# Patient Record
Sex: Female | Born: 1945 | Race: White | Hispanic: No | Marital: Single | Smoking: Never smoker
Health system: Southern US, Community
[De-identification: ages and names within clinical notes are randomized; demographics above are authoritative.]

## PROBLEM LIST (undated history)

## (undated) DIAGNOSIS — E119 Type 2 diabetes mellitus without complications: Secondary | ICD-10-CM

## (undated) DIAGNOSIS — Z923 Personal history of irradiation: Secondary | ICD-10-CM

## (undated) DIAGNOSIS — R319 Hematuria, unspecified: Secondary | ICD-10-CM

## (undated) DIAGNOSIS — T7840XA Allergy, unspecified, initial encounter: Secondary | ICD-10-CM

## (undated) DIAGNOSIS — I5033 Acute on chronic diastolic (congestive) heart failure: Secondary | ICD-10-CM

## (undated) DIAGNOSIS — J40 Bronchitis, not specified as acute or chronic: Secondary | ICD-10-CM

## (undated) DIAGNOSIS — J302 Other seasonal allergic rhinitis: Secondary | ICD-10-CM

## (undated) DIAGNOSIS — K219 Gastro-esophageal reflux disease without esophagitis: Secondary | ICD-10-CM

## (undated) DIAGNOSIS — L589 Radiodermatitis, unspecified: Secondary | ICD-10-CM

## (undated) DIAGNOSIS — C50112 Malignant neoplasm of central portion of left female breast: Secondary | ICD-10-CM

## (undated) DIAGNOSIS — I2699 Other pulmonary embolism without acute cor pulmonale: Secondary | ICD-10-CM

## (undated) DIAGNOSIS — IMO0002 Reserved for concepts with insufficient information to code with codable children: Secondary | ICD-10-CM

## (undated) DIAGNOSIS — Z972 Presence of dental prosthetic device (complete) (partial): Secondary | ICD-10-CM

## (undated) DIAGNOSIS — I10 Essential (primary) hypertension: Secondary | ICD-10-CM

## (undated) DIAGNOSIS — Z5189 Encounter for other specified aftercare: Secondary | ICD-10-CM

## (undated) DIAGNOSIS — C50919 Malignant neoplasm of unspecified site of unspecified female breast: Secondary | ICD-10-CM

## (undated) DIAGNOSIS — C801 Malignant (primary) neoplasm, unspecified: Secondary | ICD-10-CM

## (undated) HISTORY — DX: Allergy, unspecified, initial encounter: T78.40XA

## (undated) HISTORY — DX: Reserved for concepts with insufficient information to code with codable children: IMO0002

## (undated) HISTORY — DX: Malignant neoplasm of unspecified site of unspecified female breast: C50.919

## (undated) HISTORY — DX: Personal history of irradiation: Z92.3

## (undated) HISTORY — PX: BREAST SURGERY: SHX581

## (undated) HISTORY — DX: Encounter for other specified aftercare: Z51.89

---

## 2011-07-16 ENCOUNTER — Other Ambulatory Visit: Payer: Self-pay | Admitting: Obstetrics and Gynecology

## 2011-07-16 DIAGNOSIS — N63 Unspecified lump in unspecified breast: Secondary | ICD-10-CM

## 2011-07-24 ENCOUNTER — Ambulatory Visit
Admission: RE | Admit: 2011-07-24 | Discharge: 2011-07-24 | Disposition: A | Discharge status: Home / Self Care | Payer: Medicare Other | Source: Ambulatory Visit | Attending: Obstetrics and Gynecology | Admitting: Obstetrics and Gynecology

## 2011-07-24 ENCOUNTER — Other Ambulatory Visit: Payer: Self-pay | Admitting: Obstetrics and Gynecology

## 2011-07-24 DIAGNOSIS — N63 Unspecified lump in unspecified breast: Secondary | ICD-10-CM

## 2011-07-24 DIAGNOSIS — N6310 Unspecified lump in the right breast, unspecified quadrant: Secondary | ICD-10-CM

## 2011-07-24 DIAGNOSIS — N6320 Unspecified lump in the left breast, unspecified quadrant: Secondary | ICD-10-CM

## 2011-07-30 ENCOUNTER — Ambulatory Visit
Admission: RE | Admit: 2011-07-30 | Discharge: 2011-07-30 | Disposition: A | Discharge status: Home / Self Care | Payer: Medicare Other | Source: Ambulatory Visit | Attending: Obstetrics and Gynecology | Admitting: Obstetrics and Gynecology

## 2011-07-30 ENCOUNTER — Other Ambulatory Visit: Payer: Self-pay | Admitting: Obstetrics and Gynecology

## 2011-07-30 DIAGNOSIS — N6310 Unspecified lump in the right breast, unspecified quadrant: Secondary | ICD-10-CM

## 2011-07-30 DIAGNOSIS — N6320 Unspecified lump in the left breast, unspecified quadrant: Secondary | ICD-10-CM

## 2011-07-31 ENCOUNTER — Ambulatory Visit
Admission: RE | Admit: 2011-07-31 | Discharge: 2011-07-31 | Disposition: A | Discharge status: Home / Self Care | Payer: Medicare Other | Source: Ambulatory Visit | Attending: Obstetrics and Gynecology | Admitting: Obstetrics and Gynecology

## 2011-07-31 ENCOUNTER — Other Ambulatory Visit: Payer: Self-pay | Admitting: Obstetrics and Gynecology

## 2011-07-31 DIAGNOSIS — N6320 Unspecified lump in the left breast, unspecified quadrant: Secondary | ICD-10-CM

## 2011-07-31 DIAGNOSIS — C50912 Malignant neoplasm of unspecified site of left female breast: Secondary | ICD-10-CM

## 2011-07-31 DIAGNOSIS — N6310 Unspecified lump in the right breast, unspecified quadrant: Secondary | ICD-10-CM

## 2011-08-01 ENCOUNTER — Telehealth: Payer: Self-pay | Admitting: *Deleted

## 2011-08-01 ENCOUNTER — Other Ambulatory Visit: Payer: Self-pay | Admitting: *Deleted

## 2011-08-01 DIAGNOSIS — C50119 Malignant neoplasm of central portion of unspecified female breast: Secondary | ICD-10-CM

## 2011-08-01 DIAGNOSIS — C50112 Malignant neoplasm of central portion of left female breast: Secondary | ICD-10-CM

## 2011-08-01 HISTORY — DX: Malignant neoplasm of central portion of left female breast: C50.112

## 2011-08-01 NOTE — Telephone Encounter (Signed)
Confirmed [facility] for 08/06/11 at 1200 .  Instructions and contact information given.  

## 2011-08-05 ENCOUNTER — Ambulatory Visit
Admission: RE | Admit: 2011-08-05 | Discharge: 2011-08-05 | Disposition: A | Discharge status: Home / Self Care | Payer: Medicare Other | Source: Ambulatory Visit | Attending: Obstetrics and Gynecology | Admitting: Obstetrics and Gynecology

## 2011-08-05 DIAGNOSIS — C50912 Malignant neoplasm of unspecified site of left female breast: Secondary | ICD-10-CM

## 2011-08-05 MED ORDER — GADOBENATE DIMEGLUMINE 529 MG/ML IV SOLN
20.0000 mL | Freq: Once | INTRAVENOUS | Status: AC | PRN
  Administered 2011-08-05: 20 mL via INTRAVENOUS

## 2011-08-06 ENCOUNTER — Ambulatory Visit: Payer: Medicare Other

## 2011-08-06 ENCOUNTER — Other Ambulatory Visit: Payer: Medicare Other

## 2011-08-06 ENCOUNTER — Ambulatory Visit (HOSPITAL_BASED_OUTPATIENT_CLINIC_OR_DEPARTMENT_OTHER): Payer: Medicare Other | Admitting: Oncology

## 2011-08-06 ENCOUNTER — Ambulatory Visit (HOSPITAL_BASED_OUTPATIENT_CLINIC_OR_DEPARTMENT_OTHER): Payer: Medicare Other | Admitting: Surgery

## 2011-08-06 ENCOUNTER — Encounter: Payer: Self-pay | Admitting: *Deleted

## 2011-08-06 ENCOUNTER — Ambulatory Visit
Admission: RE | Admit: 2011-08-06 | Discharge: 2011-08-06 | Disposition: A | Discharge status: Home / Self Care | Payer: Medicare Other | Source: Ambulatory Visit | Attending: Radiation Oncology | Admitting: Radiation Oncology

## 2011-08-06 ENCOUNTER — Encounter (INDEPENDENT_AMBULATORY_CARE_PROVIDER_SITE_OTHER): Payer: Self-pay | Admitting: Surgery

## 2011-08-06 ENCOUNTER — Ambulatory Visit: Payer: Medicare Other | Attending: Surgery | Admitting: Physical Therapy

## 2011-08-06 VITALS — BP 172/97 | HR 76 | Temp 98.2°F | Ht 68.0 in | Wt 278.8 lb

## 2011-08-06 DIAGNOSIS — M25619 Stiffness of unspecified shoulder, not elsewhere classified: Secondary | ICD-10-CM | POA: Insufficient documentation

## 2011-08-06 DIAGNOSIS — C50119 Malignant neoplasm of central portion of unspecified female breast: Secondary | ICD-10-CM

## 2011-08-06 DIAGNOSIS — IMO0001 Reserved for inherently not codable concepts without codable children: Secondary | ICD-10-CM | POA: Insufficient documentation

## 2011-08-06 DIAGNOSIS — R293 Abnormal posture: Secondary | ICD-10-CM | POA: Insufficient documentation

## 2011-08-06 DIAGNOSIS — Z170 Estrogen receptor positive status [ER+]: Secondary | ICD-10-CM

## 2011-08-06 DIAGNOSIS — C50919 Malignant neoplasm of unspecified site of unspecified female breast: Secondary | ICD-10-CM

## 2011-08-06 LAB — CBC WITH DIFFERENTIAL/PLATELET
BASO%: 0.3 % (ref 0.0–2.0)
EOS%: 1.4 % (ref 0.0–7.0)
LYMPH%: 31.5 % (ref 14.0–49.7)
MCHC: 34.2 g/dL (ref 31.5–36.0)
MCV: 92 fL (ref 79.5–101.0)
MONO%: 7.9 % (ref 0.0–14.0)
Platelets: 248 10*3/uL (ref 145–400)
RBC: 4.01 10*6/uL (ref 3.70–5.45)

## 2011-08-06 LAB — COMPREHENSIVE METABOLIC PANEL
ALT: 13 U/L (ref 0–35)
AST: 16 U/L (ref 0–37)
Alkaline Phosphatase: 90 U/L (ref 39–117)
Sodium: 139 mEq/L (ref 135–145)
Total Bilirubin: 0.3 mg/dL (ref 0.3–1.2)
Total Protein: 7.2 g/dL (ref 6.0–8.3)

## 2011-08-06 NOTE — Progress Notes (Signed)
Mailed after appt letter to pt. 

## 2011-08-06 NOTE — Progress Notes (Signed)
Dr.  Darnelle Catalan requested a consultation for genetic counseling and risk assessment for Linda [name], a 66 y.o. female, for discussion of her personal history of Bilateral breast cancer. She presents to clinic today to discuss the possibility of a genetic predisposition to cancer, and to further clarify her risks, as well as her family members' risks for cancer.   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    No past medical history on file.  No past surgical history on file.  History  Substance Use Topics  . Smoking status: Not on file  . Smokeless tobacco: Not on file  . Alcohol Use: Not on file    REPRODUCTIVE HISTORY AND PERSONAL RISK ASSESSMENT FACTORS: Menarche was at age 35-13.   Post-menopausal Uterus Intact: Yes Ovaries Intact: Yes [id number] , first live birth at age 82      FAMILY HISTORY:  We obtained a detailed, 4-generation family history.  Significant diagnoses are listed below:  Tazia's sister was dianosed with breast cancer at age 65 and her mother was diagnosed with breast cancer at age 74.  No other cancer history is know.  Patient's maternal ancestors are of German/American [place] and [place] descent, and paternal ancestors are of [place] descent. There is no reported Ashkenazi Jewish ancestry. There is no known consanguinity.  GENETIC COUNSELING RISK ASSESSMENT, DISCUSSION, AND SUGGESTED FOLLOW UP: We reviewed the natural history and genetic etiology of sporadic, familial and hereditary cancer syndromes.  The patient's bilateral breast cancer is suggestive of the following possible diagnosis: mutation in the BRCA1/BRCA2 genes  We discussed that identification of a hereditary cancer syndrome may help her care providers tailor the patients medical management. If a BRCA1 or BRCA2 mutation indicating hereditary breast and ovarian cancer is detected in this  case,the patient will be referred back to the referring provider and to any additional appropriate care providers to discuss the relevant options.   If a mutation is not found in the patient, this will decrease the likelihood of BRCA mutations as the explanation for her breast cancer. Cancer surveillance options would be discussed for the patient according to the appropriate standard National Comprehensive Cancer Network and American Cancer Society guidelines, with consideration of their personal and family history risk factors. In this case, the patient will be referred back to their care providers for discussions of management.   Based on the patient's personal and family history, and literature, data were used to estimate her risk of having a BRCA1 or BRCA2 mutation. Based on this information, genetic testing is recommended.  After considering the risks, benefits, and limitations, the patient provided informed consent for  the following  testing:  BRCA1/BRCA1 and BART through [facility].   Per the patient's request, we will contact her by telephone to discuss these results. A follow up genetic counseling visit will be scheduled if indicated.  The patient was seen for a total of 45 minutes, greater than 50% of which was spent face-to-face counseling.  This plan is being carried out per Dr. Darrall Dears recommendations.  This note will also be sent to the referring provider via the electronic medical record. The patient will be supplied with a summary of this genetic counseling discussion as well as educational information on the discussed hereditary cancer syndromes following the conclusion of their visit.   Patient was discussed with Dr. Drue Second.   EPIC CC: Dr.  G. Magrinat Dr. Magnus Ivan Dr. Mitzi Hansen  EDUCATIONAL INFORMATION SUPPLIED TO PATIENT AT ENCOUNTER:  [facility] Hereditary Breast and Ovarian Cancer  brochure   _______________________________________________________________________ For Office Staff:  Number of people involved in session: 3 Was an Intern/ student involved with case: not applicable

## 2011-08-06 NOTE — Progress Notes (Signed)
ID: Linda [name]   DOB: 1946/04/24  MR#: [id number]  WUX#:[id number]  HISTORY OF PRESENT ILLNESS: The patient noted a small amount of drainage from her left breast December of 2012. She brought it to her gynecologist's attention in January of 2013 and was set up for bilateral diagnostic mammography at the breast Center every seventh 2013. This was the patient's first ever mammogram. It showed a large irregular mass in the left retroareolar region extending to the nipple, with nipple retraction and skin thickening. This measured approximately 8.4 cm. It was associated with pleomorphic microcalcifications. By exam there was moderate distortion and retraction of the nipple with a large palpable ill-defined area in the retroareolar region. In the right right breast there was also a 2 cm hard mass palpated. Ultrasound of the right breast mass showed a complex cystic/solid area measuring 1.9 cm. Ultrasound of the right axilla was negative. Ultrasound of the left breast showed a large hypoechoic mass measuring at least 3.8 cm. The left axilla showed several adjacent abnormal appearing lymph nodes.  With this information biopsy of the right and left breast masses were obtained the same day, and showed  ([id number])     (a) on the right, and invasive ductal carcinoma with papillary features, estrogen and progesterone receptor positive, HER-2 negative, with an MIB-1 of 10%.    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 The MIB-1 was 60% for the left-sided tumor.  With this information the patient was presented at the multidisciplinary breast cancer conference 08/06/2011. Subsequent evaluation and treatments are as detailed below.  REVIEW OF SYSTEMS: Aside from the slight amount of discharge from the left breast she was not having any significant or suspicious symptoms. She does have a history of microscopic  hematuria, noted by her gynecologist, and has an appointment with a urologist next week. She has not had any gross hematuria or any other evidence of bleeding. There has been no pain, no fever, no an explain fatigue or weight loss. A detailed review of systems was otherwise entirely negative. She does not exercise on a regular basis.  PAST MEDICAL HISTORY: History of microscopic hematuria as noted History of prolapsed bladder.  PAST SURGICAL HISTORY: No prior surgeries  FAMILY HISTORY The patient's father died from a heart attack at the age of 34. The patient's mother died from apparently heart problems at the age of 11. The patient had no brothers. She has 3 sisters. One of her sisters was diagnosed with breast cancer in her mid 48s. The patient does not know if his sister was ever genetically tested. The patient's mother had a mastectomy at the age of 48, presumably for breast cancer. There is no other history of breast or in cancer in the family to her knowledge.  GYNECOLOGIC HISTORY: She does not recall when she had menarche. She had her first child at age 77. She is GX P3. She underwent menopause in her mid 61s. She never took hormone replacement.  SOCIAL HISTORY: She worked in the past as a Corporate investment banker for [employer] and also for age [employer]. She is now retired, but still works at one of her [employer] as (he owns a Nurse, mental health). She moved to [place] about 2 years ago but has a Museum/gallery curator in [place]. Son Thayer Ohm lives in [place] and works as a IT sales professional. His wife neck is presents today she is a Engineer, civil (consulting).  Son Onalee Hua lives at [employer] and is a Art therapist in addition to having the [employer]. The patient attends a local [employer] here   ADVANCED DIRECTIVES: Not in place  HEALTH MAINTENANCE: History  Substance Use Topics  . Smoking status: Not on file  . Smokeless tobacco: Not on file  . Alcohol Use: Not on file     Colonoscopy:  Never r  PAP: 07/24/2011  Bone density: Never  Lipid panel:  No Known Allergies  No current outpatient prescriptions on file.    OBJECTIVE: [name] in no acute distress Filed Vitals:   08/06/11 1246  BP: 172/97  Pulse: 76  Temp: 98.2 F (36.8 C)     Body mass index is 42.39 kg/(m^2).    ECOG FS: 0  Sclerae unicteric Oropharynx clear The right axilla is clear. The left axilla is "full" but I do not palpate hard  well-defined adenopathy. Lungs no rales or rhonchi Heart regular rate and rhythm Abd obese, benign MSK no focal spinal tenderness, no peripheral edema Neuro: nonfocal Breasts: The right breast shows no skin or nipple changes. There is a hard palpable mass in the lower outer quadrant measuring perhaps 2 cm. In the left breast the areolar appears abnormal, although I do not see any evidence of ulceration. The nipple is not retracted. There is significant distortion however. The mass in the left breast measures well over 5 cm by palpation.  LAB RESULTS: Lab Results  Component Value Date   WBC 5.9 08/06/2011   NEUTROABS 3.5 08/06/2011   HGB 12.6 08/06/2011   HCT 36.9 08/06/2011   MCV 92.0 08/06/2011   PLT 248 08/06/2011      Chemistry      Component Value Date/Time   NA 139 08/06/2011 1206   K 3.8 08/06/2011 1206   CL 103 08/06/2011 1206   CO2 29 08/06/2011 1206   BUN 10 08/06/2011 1206   CREATININE 0.56 08/06/2011 1206      Component Value Date/Time   CALCIUM 9.5 08/06/2011 1206   ALKPHOS 90 08/06/2011 1206   AST 16 08/06/2011 1206   ALT 13 08/06/2011 1206   BILITOT 0.3 08/06/2011 1206       No results found for this basename: LABCA2    No results found for this basename: INR:1;PROTIME:1 in the last 168 hours  No results found for this basename: UACOL:1,UAPR:1,USPG:1,UPH:1,UTP:1,UGL:1,UKET:1,UBIL:1,UHGB:1,UNIT:1,UROB:1,ULEU:1,UEPI:1,UWBC:1,URBC:1,UBAC:1,CAST:1,CRYS:1,UCOM:1,BILUA:1 in the last 72 hours   STUDIES: US Breast Bilateral  07/24/2011   *RADIOLOGY REPORT*  Clinical Data:  Patient presents for a bilateral diagnostic mammogram due to a palpable abnormality over the lower outer right breast and swollen left breast with spontaneous clear and bloody left nipple discharge for 1 month.  DIGITAL DIAGNOSTIC BILATERAL MAMMOGRAM WITH CAD AND BILATERAL BREAST ULTRASOUND:  Comparison:  None.  Findings:  Exam demonstrates scattered fibroglandular densities. There is a large irregular mass with spiculation in the left retroareolar region extending to the nipple with associated nipple retraction and extensive skin thickening.  This mass measures approximately 8.4 cm in greatest diameter.  There are associated pleomorphic microcalcifications over the upper outer portion of this mass. Mammographic images were processed with CAD.  On physical exam, there is a hard somewhat ill-defined approximate 2 cm hard mass at the 7 o'clock position of the right breast 5 cm from the nipple.  The left breast demonstrates moderate distortion and retraction of the nipple with a large palpable hard ill-defined area in the retroareolar region most prominent from  the 11- 3 o'clock position.  Ultrasound is performed, showing an irregular hypoechoic heterogeneous mass at the 7 o'clock position of the right breast 5 cm from the nipple corresponding to patient's palpable abnormality. There are two internal cystic areas likely necrosis.  This mass measures 1.6 x 1.8 x 1.9 cm.  Ultrasound of the right axilla demonstrates no abnormal appearing lymph nodes.  Ultrasound examination of the left breast demonstrates a large ill- defined hypoechoic solid mass with shadowing deep to the retracted nipple best seen from the 11 o'clock to the 3 o'clock position 2 cm from the nipple.  This measures approximately 1.6 x 3.8 x 3.8 cm. Evaluation of the left axilla demonstrates two-to-three adjacent abnormal appearing lymph nodes.  IMPRESSION: Large irregular/spiculated mass in the left breast deep to the  nipple measuring approximately 8.4 cm in greatest mammographic dimension.  Associated abnormal left axillary adenopathy. Irregular heterogeneous solid mass in the right breast at the 7 o'clock position 5 cm from the nipple measuring 1.6 x 1.8 x 1.9 cm also concerning for ductal carcinoma.  Recommendations:  Recommend ultrasound-guided core biopsy of both suspicious breast masses and left axillary adenopathy.  BI-RADS CATEGORY 5:  Highly suggestive of malignancy - appropriate action should be taken.  Biopsy is scheduled for Wednesday 07/30/11.  Original Report Authenticated By: Nash Dimmer   Mr Breast Bilateral W Wo Contrast  08/05/2011  *RADIOLOGY REPORT*  Clinical Data: New diagnosis bilateral breast cancer, family history breast cancer (mother).  BUN and creatinine were obtained on site at [facility] at [address] Mammogram dated 07/24/2011 Results:  BUN 12 mg/dL,  Creatinine 0.7 mg/dL.  BILATERAL BREAST MRI WITH AND WITHOUT CONTRAST  Technique: Multiplanar, multisequence MR images of both breasts were obtained prior to and following the intravenous administration of 20ml of Multihance  Three dimensional images were evaluated at the independent DynaCad workstation.  Comparison:  None.  Findings: Foci of nonspecific enhancement are seen in the right breast.  A round, heterogeneously enhancing mass with circumscribed margins is seen in the lower outer quadrant of the right breast, posteriorly with associated biopsy clip measuring 2.1 x 2.0 x 2.2 cm compatible with the known malignancy. No other suspicious mass or enhancement is seen in the right breast.  No axillary or internal mammary adenopathy is identified.  An irregular, heterogeneously enhancing mass is seen centered in the left subareolar region with associated nipple retraction and enhancement which measures 6.9 x 6.6 x 6.9 cm.  A biopsy clip is seen centered in the mass.  Multiple enhancing, round masses surround the subareolar mass  involving all quadrants of the breast with total affected area measuring approximately 11.8 x 9.6 x 8.8 cm. Multiple foci of enhancement are identified in the left pectoralis muscle.  Skin thickening and ehnancement is seen compatible with dermal lymphatic involvement.  Multiple abnormal lymph nodes are seen in the left axilla with an abnormal morphology consistent with known metastatic disease.  The largest lymph node and left axilla measures 2.4 x 2.3 x 3.4 cm.  An enlarged left internal mammary lymph node is also seen measuring 0.9 x 0.7 cm.  IMPRESSION:  1.  Multicentric left sided breast cancer with associated dermal lymphatic invasion and involvement of the left pectoralis muscle. Metastatic disease, left axilla and suspicious left internal mammary lymph node. 2.  Known malignancy, right breast.  No axillary or internal mammary adenopathy on the right.  THREE-DIMENSIONAL MR IMAGE RENDERING ON INDEPENDENT WORKSTATION:  Three-dimensional MR images were rendered by post-processing  of the original MR data on an independent workstation.  The three- dimensional MR images were interpreted, and findings were reported in the accompanying complete MRI report for this study.  BI-RADS CATEGORY 6:  Known biopsy-proven malignancy - appropriate action should be taken.  Original Report Authenticated By: Hiram Gash, M.D.    ASSESSMENT: [name] status post bilateral breast biopsies 07/24/2011, showing, on the right, a clinical T2 N0 papillary/ductal breast cancer, estrogen and progesterone receptor positive, HER-2 negative, with an MIB-1 of 10%; on the left, a T3 N1 invasive ductal carcinoma, grade 3, triple positive, with an MIB-1 of 60%.  PLAN: We spent the better part of her hour-long visit discussing her situation. She understands she has stage III disease on the left and stage II disease on the right. The left-sided tumor of course is the more dangerous 1. She will need staging studies  including a PET scan and CT scan of the chest. She has a significant family history and needs genetic testing. (The results of the genetic test may help her decide whether or not she wants to keep her breast). We do not feel even with neoadjuvant treatment the left breast cavity saved, but in any case neoadjuvant chemotherapy therapy together with Herceptin will make the surgery on the left side easier. That is therefore our recommendation.  The plan is for her to have a port placed, undergo staging studies,, to our chemotherapy school, and have an echocardiogram with special attention to Dr.Dan BenSimhon. She will return to see me on March 11 and our target chemotherapy date is March 12. My plan for her is 4 cycles of dose dense doxorubicin/cyclophosphamide, followed by 4 cycles of dose dense paclitaxel, with trastuzumab started at the time of paclitaxel. This would be followed by surgery, then radiation, then anti-estrogen therapy. Of course they trastuzumab will be continued to a total one year  All this information was given to the patient in writing. She has a very good understanding of the overall plan and is in agreement with that. She knows to call for any problems that may develop before the next visit.   Khristopher Kapaun C    08/06/2011

## 2011-08-06 NOTE — Progress Notes (Signed)
Patient ID: Linda [name], female   DOB: 04-01-1946, 66 y.o.   MRN: [id number]  Chief Complaint  Patient presents with  . Other    bilateral breast cancer    HPI Linda [name] is a 66 y.o. female.  HPI  She is referred by Dr. Ellyn Hack for her recent diagnosis of breast cancer.  She does complain of nipple discharge and a palpable mass. She has had no previous problems with her breast. She is otherwise without complaints  History reviewed. No pertinent past medical history.  History reviewed. No pertinent past surgical history.  History reviewed. No pertinent family history.  Social History History  Substance Use Topics  . Smoking status: Not on file  . Smokeless tobacco: Not on file  . Alcohol Use: Not on file    No Known Allergies  No current outpatient prescriptions on file.    Review of Systems Review of Systems  Constitutional: Negative.   HENT: Negative.   Eyes: Negative.   Respiratory: Negative.   Cardiovascular: Negative.   Gastrointestinal: Positive for blood in stool.  Genitourinary: Negative.   Musculoskeletal: Negative.   Neurological: Negative.   Hematological: Negative.   Psychiatric/Behavioral: Negative.     Wt Readings from Last 3 Encounters:  08/06/11 278 lb 12.8 oz (126.463 kg)   Temp Readings from Last 3 Encounters:  08/06/11 98.2 F (36.8 C)    BP Readings from Last 3 Encounters:  08/06/11 172/97   Pulse Readings from Last 3 Encounters:  08/06/11 76   Physical Exam Physical Exam  Data Reviewed We have reviewed the mammograms, MRI, and pathology reports demonstrating bilateral breast cancers.  The left cancer is ER and PR positive as well as HER-2 positive. The right breast cancer is ER/PR positive and HER-2 negative.  Assessment    Patient with bilateral breast cancers. The left side is quite extensive with positive lymph nodes as well as possible chest wall involvement.    Plan    We have had discussion with the patient in  a multidisciplinary clinic. We are recommending neoadjuvant therapy. This would then be followed by at least a left mastectomy and axillary lymph node dissection. The right breast cancer could be removed via lumpectomy or mastectomy pending the patient's wishes. She will need a Port-A-Cath inserted for intravenous chemotherapy. I discussed Port-A-Cath insertion with her. I discussed the risks of the procedure which includes but is not limited to bleeding, infection, pneumothorax, infection of the Port-A-Cath, et Karie Soda. She wished to proceed with port insertion. Surgery we scheduled as soon as possible. Likelihood of success with the port is good       Linda [name] A 08/06/2011, 2:26 PM

## 2011-08-07 ENCOUNTER — Other Ambulatory Visit: Payer: Self-pay | Admitting: Oncology

## 2011-08-07 ENCOUNTER — Telehealth: Payer: Self-pay | Admitting: Oncology

## 2011-08-07 NOTE — Telephone Encounter (Signed)
S/w the pt regarding her pet scan/ct scan appts with instructions along with her may 2013 appts. Pt is aware she will be contacted with the genetics testing appt and the appt for the echo and with dr benshimon. Dawn was not available s/w heather from the echo lab whio stated that she will call the pt on Monday.

## 2011-08-11 ENCOUNTER — Telehealth: Payer: Self-pay | Admitting: *Deleted

## 2011-08-11 ENCOUNTER — Encounter: Payer: Self-pay | Admitting: *Deleted

## 2011-08-11 NOTE — Telephone Encounter (Signed)
Spoke to pt concerning [facility] from 2/20.  Pt denies questions or concerns regarding dx or treatment care plan.  Informed pt one of our schedulers will be calling to schedule chemo class at the cancer center.  Encourage pt to call with needs.  Received verbal understanding.  Contact information given.

## 2011-08-12 ENCOUNTER — Telehealth: Payer: Self-pay | Admitting: Oncology

## 2011-08-12 NOTE — Telephone Encounter (Signed)
S/w the pt and she is aware of her chemo educ class in march

## 2011-08-13 ENCOUNTER — Encounter (HOSPITAL_COMMUNITY)
Admission: RE | Admit: 2011-08-13 | Discharge: 2011-08-13 | Disposition: A | Discharge status: Home / Self Care | Payer: Medicare Other | Source: Ambulatory Visit | Attending: Oncology | Admitting: Oncology

## 2011-08-13 ENCOUNTER — Encounter (HOSPITAL_COMMUNITY): Payer: Self-pay | Admitting: *Deleted

## 2011-08-13 DIAGNOSIS — C801 Malignant (primary) neoplasm, unspecified: Secondary | ICD-10-CM

## 2011-08-13 DIAGNOSIS — C50119 Malignant neoplasm of central portion of unspecified female breast: Secondary | ICD-10-CM

## 2011-08-13 DIAGNOSIS — R599 Enlarged lymph nodes, unspecified: Secondary | ICD-10-CM | POA: Insufficient documentation

## 2011-08-13 DIAGNOSIS — R319 Hematuria, unspecified: Secondary | ICD-10-CM

## 2011-08-13 HISTORY — DX: Malignant (primary) neoplasm, unspecified: C80.1

## 2011-08-13 HISTORY — DX: Hematuria, unspecified: R31.9

## 2011-08-13 HISTORY — PX: OTHER SURGICAL HISTORY: SHX169

## 2011-08-13 MED ORDER — IOHEXOL 300 MG/ML  SOLN
80.0000 mL | Freq: Once | INTRAMUSCULAR | Status: AC | PRN
  Administered 2011-08-13: 80 mL via INTRAVENOUS

## 2011-08-13 NOTE — Progress Notes (Signed)
CC:   Lowella Dell, M.D. Abigail Miyamoto, M.D.  REFERRING PHYSICIAN:  Abigail Miyamoto, M.D.  DIAGNOSIS:  New diagnosis of bilateral breast cancer.  HISTORY OF PRESENT ILLNESS:  The patient is a 66 year old female who noticed some drainage from the left breast in late 2012 and she discussed this with her gynecologist.  She has no history of a prior mammogram but she was sent for a bilateral diagnostic mammogram at the breast center.  This showed a large irregular mass within the left retroareolar region, extending to the nipple and this measured approximately 8.4 cm.  There was some retraction of the nipple.  On the right there was also a 2-cm hard mass which was palpable.  Ultrasound did show a right breast mass measuring 1.9 cm with the right axilla negative on ultrasound.  On ultrasound the left breast did show a large hypoechoic mass measuring at least 3.8 cm n several adjacent abnormal appearing lymph nodes were also present.  The patient has proceeded with a biopsy.  On the right this returned positive for invasive ductal carcinoma with papillary features.  Receptor studies have indicated that the tumor is ER positive, PR positive and HER-2 negative.  On the left this revealed invasive ductal carcinoma with DCIS, ER positive, PR positive and HER-2/neu positive.  An additional lymph node was biopsied on the left and this showed metastatic carcinoma.  The patient proceeded with an MRI scan of the breast bilaterally.  This showed multicentric left-sided breast cancer with associated dermal lymphatic invasion and involvement of the left pectoralis muscle.  This measured 6.9 x 6.6 x 6.9 cm with respect to the dominant mass.  Node malignancy on the left was also seen measuring 2.1 cm.  With respect to the left the total area of multiple enhancing masses measured 11.8 cm.  The largest lymph node measured 3.4 cm and an enlarged internal mammary lymph node was also seen measuring  0.9 cm.  The patient is seen today in multidisciplinary breast clinic.  PAST MEDICAL HISTORY:  Prolapsed bladder, microscopic hematuria.  CURRENT MEDICATIONS:  None.  ALLERGIES:  No known drug allergies.  FAMILY HISTORY:  The patient's mother had breast cancer at age 37.  She also has a sister, she states, with breast cancer.  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  She has a condo in [place] but also lives in [place].  No known history of smoking.  REVIEW OF SYSTEMS:  Negative other than as above.  PHYSICAL EXAM:  Vital signs:  Weight 278 pounds, blood pressure 172/97, pulse 76, respiratory rate 20, temperature 98.2.  General:  Well- developed female in no acute distress.  HEENT:  Normocephalic atraumatic.  Oral cavity clear.  Cardiovascular:  Regular rate and rhythm.  Respiratory: Clear to auscultation.  On the right, no palpable lymphadenopathy in the axilla.  Palpable mass is present in the lower outer quadrant.  On the left, there is a palpable mass in the central region measuring over 5 cm.  Palpable lymphadenopathy is present on the left.  GI:  Abdomen soft, nontender, normal bowel sounds.  Extremities: No edema present.  Neuro:  No focal deficits.  IMPRESSION AND PLAN:  Linda [name] is a pleasant 66 year old female with a diagnosis of bilateral breast cancer.  On the right, she is presenting with a T2 N0 invasive ductal carcinoma, and on the left she is presenting with a T3 N1 invasive ductal carcinoma clinically.  We have recommended staging studies and genetic testing.  It was also felt that neoadjuvant chemotherapy may help in terms of her resection and she will proceed with this with Dr. Darnelle Catalan.  Would recommend postmastectomy radiation on the left, which is anticipated.  The surgical procedure on the right has not been decided upon and this may be an influence with further testing as far as genetics.  If she  proceeds with a lumpectomy then certainly I would recommend radiation and if she proceed with a mastectomy then on the basis of her presentation today I suspect that she would not need postmastectomy radiation on this side although this would be reviewed after surgery.  I have discussed all this with the patient and look forward to seeing her after her surgery to further review her case and coordinate an anticipated course of radiation treatment.  We did discussed a typical 6-1/2 week course of treatment, including the side effects, benefits and risks and all of her questions were answered.    ______________________________ Radene Gunning, M.D., Ph.D. JSM/MEDQ  D:  08/12/2011  T:  08/12/2011  Job:  [id number]

## 2011-08-14 ENCOUNTER — Encounter (HOSPITAL_COMMUNITY): Payer: Self-pay | Admitting: Pharmacy Technician

## 2011-08-15 ENCOUNTER — Ambulatory Visit (HOSPITAL_COMMUNITY)
Admission: RE | Admit: 2011-08-15 | Discharge: 2011-08-15 | Disposition: A | Discharge status: Home / Self Care | Payer: Medicare Other | Source: Ambulatory Visit | Attending: Surgery | Admitting: Surgery

## 2011-08-15 ENCOUNTER — Encounter (HOSPITAL_COMMUNITY): Payer: Self-pay | Admitting: Anesthesiology

## 2011-08-15 ENCOUNTER — Encounter (HOSPITAL_COMMUNITY): Payer: Self-pay

## 2011-08-15 ENCOUNTER — Ambulatory Visit (HOSPITAL_COMMUNITY): Payer: Medicare Other | Admitting: Anesthesiology

## 2011-08-15 ENCOUNTER — Ambulatory Visit (HOSPITAL_COMMUNITY): Payer: Medicare Other

## 2011-08-15 ENCOUNTER — Encounter (HOSPITAL_COMMUNITY)
Admission: RE | Disposition: A | Discharge status: Home / Self Care | Payer: Self-pay | Source: Ambulatory Visit | Attending: Surgery

## 2011-08-15 DIAGNOSIS — Z452 Encounter for adjustment and management of vascular access device: Secondary | ICD-10-CM

## 2011-08-15 DIAGNOSIS — C50919 Malignant neoplasm of unspecified site of unspecified female breast: Secondary | ICD-10-CM | POA: Insufficient documentation

## 2011-08-15 HISTORY — DX: Hematuria, unspecified: R31.9

## 2011-08-15 HISTORY — PX: PORTACATH PLACEMENT: SHX2246

## 2011-08-15 HISTORY — DX: Malignant (primary) neoplasm, unspecified: C80.1

## 2011-08-15 LAB — SURGICAL PCR SCREEN: Staphylococcus aureus: NEGATIVE

## 2011-08-15 SURGERY — INSERTION, TUNNELED CENTRAL VENOUS DEVICE, WITH PORT
Anesthesia: Monitor Anesthesia Care | Site: Chest | Wound class: Clean

## 2011-08-15 MED ORDER — ONDANSETRON HCL 4 MG/2ML IJ SOLN: 4.0000 mg | Freq: Four times a day (QID) | INTRAMUSCULAR | Status: DC | PRN

## 2011-08-15 MED ORDER — SODIUM CHLORIDE 0.9 % IR SOLN
Status: DC | PRN
  Administered 2011-08-15: 1000 mL

## 2011-08-15 MED ORDER — MIDAZOLAM HCL 5 MG/5ML IJ SOLN
INTRAMUSCULAR | Status: DC | PRN
  Administered 2011-08-15: 2 mg via INTRAVENOUS

## 2011-08-15 MED ORDER — BUPIVACAINE-EPINEPHRINE (PF) 0.5% -1:200000 IJ SOLN
INTRAMUSCULAR | Status: AC
  Filled 2011-08-15: qty 10

## 2011-08-15 MED ORDER — SODIUM CHLORIDE 0.9 % IJ SOLN: 3.0000 mL | Freq: Two times a day (BID) | INTRAMUSCULAR | Status: DC

## 2011-08-15 MED ORDER — ACETAMINOPHEN 325 MG PO TABS: 650.0000 mg | ORAL_TABLET | ORAL | Status: DC | PRN

## 2011-08-15 MED ORDER — ACETAMINOPHEN 10 MG/ML IV SOLN
INTRAVENOUS | Status: DC | PRN
  Administered 2011-08-15: 1000 mg via INTRAVENOUS

## 2011-08-15 MED ORDER — ACETAMINOPHEN 650 MG RE SUPP
650.0000 mg | RECTAL | Status: DC | PRN
  Filled 2011-08-15: qty 1

## 2011-08-15 MED ORDER — KETAMINE HCL 10 MG/ML IJ SOLN
INTRAMUSCULAR | Status: DC | PRN
  Administered 2011-08-15: 20 mg via INTRAVENOUS

## 2011-08-15 MED ORDER — LIDOCAINE HCL 1 % IJ SOLN
INTRAMUSCULAR | Status: DC | PRN
  Administered 2011-08-15: 23 mL

## 2011-08-15 MED ORDER — SODIUM CHLORIDE 0.9 % IV SOLN: 250.0000 mL | INTRAVENOUS | Status: DC | PRN

## 2011-08-15 MED ORDER — FENTANYL CITRATE 0.05 MG/ML IJ SOLN: 25.0000 ug | INTRAMUSCULAR | Status: DC | PRN

## 2011-08-15 MED ORDER — CEFAZOLIN SODIUM-DEXTROSE 2-3 GM-% IV SOLR
2.0000 g | INTRAVENOUS | Status: AC
  Administered 2011-08-15: 2 g via INTRAVENOUS

## 2011-08-15 MED ORDER — BUPIVACAINE HCL (PF) 0.5 % IJ SOLN
INTRAMUSCULAR | Status: AC
  Filled 2011-08-15: qty 30

## 2011-08-15 MED ORDER — CEFAZOLIN SODIUM-DEXTROSE 2-3 GM-% IV SOLR
INTRAVENOUS | Status: AC
  Filled 2011-08-15: qty 50

## 2011-08-15 MED ORDER — LACTATED RINGERS IV SOLN
INTRAVENOUS | Status: DC | PRN
  Administered 2011-08-15 (×2): via INTRAVENOUS

## 2011-08-15 MED ORDER — SODIUM CHLORIDE 0.9 % IJ SOLN: 3.0000 mL | INTRAMUSCULAR | Status: DC | PRN

## 2011-08-15 MED ORDER — OXYCODONE HCL 5 MG PO TABS: 5.0000 mg | ORAL_TABLET | ORAL | Status: DC | PRN

## 2011-08-15 MED ORDER — HEPARIN SOD (PORK) LOCK FLUSH 100 UNIT/ML IV SOLN
INTRAVENOUS | Status: DC | PRN
  Administered 2011-08-15: 500 [IU] via INTRAVENOUS

## 2011-08-15 MED ORDER — FENTANYL CITRATE 0.05 MG/ML IJ SOLN
INTRAMUSCULAR | Status: DC | PRN
  Administered 2011-08-15: 100 ug via INTRAVENOUS

## 2011-08-15 MED ORDER — LIDOCAINE HCL 1 % IJ SOLN
INTRAMUSCULAR | Status: AC
  Filled 2011-08-15: qty 20

## 2011-08-15 MED ORDER — HEPARIN SOD (PORK) LOCK FLUSH 100 UNIT/ML IV SOLN
INTRAVENOUS | Status: AC
  Filled 2011-08-15: qty 5

## 2011-08-15 MED ORDER — LACTATED RINGERS IV SOLN: INTRAVENOUS | Status: DC

## 2011-08-15 MED ORDER — SODIUM CHLORIDE 0.9 % IR SOLN
Status: DC | PRN
  Administered 2011-08-15: 10:00:00

## 2011-08-15 MED ORDER — MUPIROCIN 2 % EX OINT
TOPICAL_OINTMENT | CUTANEOUS | Status: AC
  Filled 2011-08-15: qty 22

## 2011-08-15 MED ORDER — MORPHINE SULFATE 10 MG/ML IJ SOLN: 2.0000 mg | INTRAMUSCULAR | Status: DC | PRN

## 2011-08-15 MED ORDER — PROPOFOL 10 MG/ML IV EMUL
INTRAVENOUS | Status: DC | PRN
  Administered 2011-08-15: 60 ug/kg/min via INTRAVENOUS

## 2011-08-15 MED ORDER — ACETAMINOPHEN 10 MG/ML IV SOLN
INTRAVENOUS | Status: AC
  Filled 2011-08-15: qty 100

## 2011-08-15 MED ORDER — SODIUM CHLORIDE 0.9 % IR SOLN
Freq: Once | Status: DC
  Filled 2011-08-15: qty 1.2

## 2011-08-15 MED ORDER — HYDROCODONE-ACETAMINOPHEN 5-325 MG PO TABS: 1.0000 | ORAL_TABLET | ORAL | Status: AC | PRN

## 2011-08-15 SURGICAL SUPPLY — 41 items
BAG DECANTER FOR FLEXI CONT (MISCELLANEOUS) ×2 IMPLANT
BENZOIN TINCTURE PRP APPL 2/3 (GAUZE/BANDAGES/DRESSINGS) IMPLANT
BLADE HEX COATED 2.75 (ELECTRODE) ×2 IMPLANT
BLADE SURG 15 STRL LF DISP TIS (BLADE) ×1 IMPLANT
BLADE SURG 15 STRL SS (BLADE) ×1
BLADE SURG SZ11 CARB STEEL (BLADE) ×2 IMPLANT
CLOTH BEACON ORANGE TIMEOUT ST (SAFETY) ×2 IMPLANT
DECANTER SPIKE VIAL GLASS SM (MISCELLANEOUS) ×2 IMPLANT
DERMABOND ADVANCED (GAUZE/BANDAGES/DRESSINGS)
DERMABOND ADVANCED .7 DNX12 (GAUZE/BANDAGES/DRESSINGS) IMPLANT
DRAPE C-ARM 42X72 X-RAY (DRAPES) ×2 IMPLANT
DRAPE LAPAROSCOPIC ABDOMINAL (DRAPES) ×2 IMPLANT
DRSG TEGADERM 2-3/8X2-3/4 SM (GAUZE/BANDAGES/DRESSINGS) IMPLANT
DRSG TEGADERM 4X4.75 (GAUZE/BANDAGES/DRESSINGS) ×2 IMPLANT
ELECT REM PT RETURN 9FT ADLT (ELECTROSURGICAL) ×2
ELECTRODE REM PT RTRN 9FT ADLT (ELECTROSURGICAL) ×1 IMPLANT
GAUZE SPONGE 4X4 16PLY XRAY LF (GAUZE/BANDAGES/DRESSINGS) ×2 IMPLANT
GLOVE BIO SURGEON STRL SZ7 (GLOVE) ×6 IMPLANT
GLOVE BIOGEL PI IND STRL 7.0 (GLOVE) ×1 IMPLANT
GLOVE BIOGEL PI INDICATOR 7.0 (GLOVE) ×1
GLOVE SURG SIGNA 7.5 PF LTX (GLOVE) ×4 IMPLANT
GOWN STRL NON-REIN LRG LVL3 (GOWN DISPOSABLE) ×4 IMPLANT
GOWN STRL REIN XL XLG (GOWN DISPOSABLE) ×4 IMPLANT
KIT BASIN OR (CUSTOM PROCEDURE TRAY) ×2 IMPLANT
KIT PORT POWER ISP 8FR (Catheter) IMPLANT
KIT POWER CATH 8FR (Catheter) IMPLANT
NEEDLE HYPO 25X1 1.5 SAFETY (NEEDLE) ×2 IMPLANT
NS IRRIG 1000ML POUR BTL (IV SOLUTION) ×2 IMPLANT
PACK BASIC VI WITH GOWN DISP (CUSTOM PROCEDURE TRAY) ×2 IMPLANT
PENCIL BUTTON HOLSTER BLD 10FT (ELECTRODE) ×2 IMPLANT
SPONGE GAUZE 4X4 12PLY (GAUZE/BANDAGES/DRESSINGS) IMPLANT
STRIP CLOSURE SKIN 1/2X4 (GAUZE/BANDAGES/DRESSINGS) IMPLANT
SUT MNCRL AB 4-0 PS2 18 (SUTURE) ×2 IMPLANT
SUT PROLENE 2 0 SH DA (SUTURE) ×2 IMPLANT
SUT SILK 2 0 (SUTURE)
SUT SILK 2-0 30XBRD TIE 12 (SUTURE) IMPLANT
SUT VIC AB 3-0 SH 27 (SUTURE) ×1
SUT VIC AB 3-0 SH 27XBRD (SUTURE) ×1 IMPLANT
SYR CONTROL 10ML LL (SYRINGE) ×2 IMPLANT
SYRINGE 10CC LL (SYRINGE) ×2 IMPLANT
TOWEL OR 17X26 10 PK STRL BLUE (TOWEL DISPOSABLE) ×2 IMPLANT

## 2011-08-15 NOTE — Anesthesia Preprocedure Evaluation (Addendum)
Anesthesia Evaluation  Patient identified by MRN, date of birth, ID band Patient awake  General Assessment Comment:Neoplasm breast  Reviewed: Allergy & Precautions, H&P , NPO status , Patient's Chart, lab work & pertinent test results  History of Anesthesia Complications Negative for: history of anesthetic complications  Airway Mallampati: II TM Distance: >3 FB Neck ROM: Full    Dental  (+) Partial Upper, Partial Lower and Dental Advisory Given   Pulmonary neg pulmonary ROS,  clear to auscultation        Cardiovascular neg cardio ROS Regular Normal    Neuro/Psych Negative Neurological ROS  Negative Psych ROS   GI/Hepatic negative GI ROS, Neg liver ROS,   Endo/Other  Morbid obesity  Renal/GU negative Renal ROS  Genitourinary negative   Musculoskeletal negative musculoskeletal ROS (+)   Abdominal (+) obese,   Peds  Hematology negative hematology ROS (+)   Anesthesia Other Findings   Reproductive/Obstetrics negative OB ROS                          Anesthesia Physical Anesthesia Plan  ASA: II  Anesthesia Plan: MAC   Post-op Pain Management:    Induction: Intravenous  Airway Management Planned: Simple Face Mask  Additional Equipment:   Intra-op Plan:   Post-operative Plan:   Informed Consent: I have reviewed the patients History and Physical, chart, labs and discussed the procedure including the risks, benefits and alternatives for the proposed anesthesia with the patient or authorized representative who has indicated his/her understanding and acceptance.   Dental advisory given  Plan Discussed with: CRNA  Anesthesia Plan Comments:         Anesthesia Quick Evaluation

## 2011-08-15 NOTE — Interval H&P Note (Signed)
History and Physical Interval Note:  She has had no change in her history or exam  08/15/2011 9:10 AM  Linda [name]  has presented today for surgery, with the diagnosis of breast cancer   The various methods of treatment have been discussed with the patient and family. After consideration of risks, benefits and other options for treatment, the patient has consented to  Procedure(s) (LRB): INSERTION PORT-A-CATH (N/A) as a surgical intervention .  The patients' history has been reviewed, patient examined, no change in status, stable for surgery.  I have reviewed the patients' chart and labs.  Questions were answered to the patient's satisfaction.     [name] A

## 2011-08-15 NOTE — Transfer of Care (Signed)
Immediate Anesthesia Transfer of Care Note  Patient: Linda [name]  Procedure(s) Performed: Procedure(s) (LRB): INSERTION PORT-A-CATH (N/A)  Patient Location: PACU  Anesthesia Type: MAC  Level of Consciousness: awake, alert  and oriented  Airway & Oxygen Therapy: Patient Spontanous Breathing and Patient connected to face mask oxygen  Post-op Assessment: Report given to PACU RN and Post -op Vital signs reviewed and stable  Post vital signs: Reviewed and stable  Complications: No apparent anesthesia complications

## 2011-08-15 NOTE — Anesthesia Postprocedure Evaluation (Signed)
Anesthesia Post Note  Patient: Linda [name]  Procedure(s) Performed: Procedure(s) (LRB): INSERTION PORT-A-CATH (N/A)  Anesthesia type: MAC  Patient location: PACU  Post pain: Pain level controlled  Post assessment: Post-op Vital signs reviewed  Last Vitals:  Filed Vitals:   08/15/11 1030  BP: 156/71  Pulse: 64  Temp:   Resp: 16    Post vital signs: Reviewed  Level of consciousness: sedated  Complications: No apparent anesthesia complications

## 2011-08-15 NOTE — H&P (View-Only) (Signed)
Patient ID: Linda Linda [name], [name]   DOB: 10/09/1945, 66 y.o.   MRN: [id number]  Chief Complaint  Patient presents with  . Other    bilateral breast cancer    HPI Linda Linda [name] is Linda Linda [name].  HPI  She is referred by Dr. Bovard for her recent diagnosis of breast cancer.  She does complain of nipple discharge and Linda [name] palpable mass. She has had no previous problems with her breast. She is otherwise without complaints  History reviewed. No pertinent past medical history.  History reviewed. No pertinent past surgical history.  History reviewed. No pertinent family history.  Social History History  Substance Use Topics  . Smoking status: Not on file  . Smokeless tobacco: Not on file  . Alcohol Use: Not on file    No Known Allergies  No current outpatient prescriptions on file.    Review of Systems Review of Systems  Constitutional: Negative.   HENT: Negative.   Eyes: Negative.   Respiratory: Negative.   Cardiovascular: Negative.   Gastrointestinal: Positive for blood in stool.  Genitourinary: Negative.   Musculoskeletal: Negative.   Neurological: Negative.   Hematological: Negative.   Psychiatric/Behavioral: Negative.     Wt Readings from Last 3 Encounters:  08/06/11 278 lb 12.8 oz (126.463 kg)   Temp Readings from Last 3 Encounters:  08/06/11 98.2 F (36.8 C)    BP Readings from Last 3 Encounters:  08/06/11 172/97   Pulse Readings from Last 3 Encounters:  08/06/11 76   Physical Exam Physical Exam  Data Reviewed We have reviewed the mammograms, MRI, and pathology reports demonstrating bilateral breast cancers.  The left cancer is ER and PR positive as well as HER-2 positive. The right breast cancer is ER/PR positive and HER-2 negative.  Assessment    Patient with bilateral breast cancers. The left side is quite extensive with positive lymph nodes as well as possible chest wall involvement.    Plan    We have had discussion with the patient in  Linda [name] multidisciplinary clinic. We are recommending neoadjuvant therapy. This would then be followed by at least Linda [name] left mastectomy and axillary lymph node dissection. The right breast cancer could be removed via lumpectomy or mastectomy pending the patient's wishes. She will need Linda [name] Port-Linda [name]-Cath inserted for intravenous chemotherapy. I discussed Port-Linda [name]-Cath insertion with her. I discussed the risks of the procedure which includes but is not limited to bleeding, infection, pneumothorax, infection of the Port-Linda [name]-Cath, et cetera. She wished to proceed with port insertion. Surgery we scheduled as soon as possible. Likelihood of success with the port is good       Linda Linda [name] 08/06/2011, 2:26 PM    

## 2011-08-15 NOTE — Op Note (Signed)
08/15/2011  10:03 AM  PATIENT:  Linda [name]  66 y.o. female  PRE-OPERATIVE DIAGNOSIS:  breast cancer   POST-OPERATIVE DIAGNOSIS:  breast cancer   PROCEDURE:  Procedure(s) (LRB): INSERTION PORT-A-CATH (N/A)  SURGEON:  Surgeon(s) and Role:    * Shelly Rubenstein, MD - Primary  PHYSICIAN ASSISTANT:   ASSISTANTS: none   ANESTHESIA:   IV sedation  EBL:  Total I/O In: 1000 [I.V.:1000] Out: 5 [Blood:5]  BLOOD ADMINISTERED:none  DRAINS: none   LOCAL MEDICATIONS USED:  lidocaine  SPECIMEN:  No Specimen  DISPOSITION OF SPECIMEN:  N/A  COUNTS:  YES  TOURNIQUET:  * No tourniquets in log *  DICTATION: Linda [name] Dictation Indications: This is a 66 year old female with bilateral breast cancer. Port-A-Cath is being inserted for neoadjuvant chemotherapy  Procedure: The patient was brought to the operating room and identified as the correct patient. She was placed on the operating table and anesthesia was induced. Her right chest and neck were then prepped and draped in the usual sterile fashion. The right chest was anesthetized with lidocaine. A nest of the clavicle as well with the lidocaine. The introducer needle was then used to cannulate the right subclavian vein with the patient in the Trendelenburg position. I then passed the wire through the introducer needle and into the central venous system under direct fluoroscopy. The introducer needle was then removed. I anesthetized the skin further and made an incision with the scalpel. I then created a pocket for the port with a electrocautery. An 8 [place] Infuse-a-Port was brought to the field. It was flushed with heparinized saline. I then attached the catheter to the port and locked in place. I then placed the port into the pocket. I cut the catheter to appropriate length. I then fed the introducer sheath and dilator down the wire and into the central venous system. I removed the dilator and the wire. I then fed the catheter down the  peel-away sheath. The sheath was then peeled away leaving the catheter in the central venous system. Fluoroscopy confirmed good placement. I accessed the port and good flush and return were demonstrated. I then infused concentrated heparin into the port. I sewed the port to the chest wall with Prolene sutures. I then closed the subcutaneous tissue with interrupted 3-0 Vicryl sutures and closed the skin with a running 4-0 Monocryl. Steri-Strips gauze and Tegaderm were then applied. The patient tolerated the procedure well. All the counts were correct at the end of the procedure. The patient was then extubated in the operating room and taken in stable condition to the recovery room. PLAN OF CARE: Discharge to home after PACU  PATIENT DISPOSITION:  PACU - hemodynamically stable.   Delay start of Pharmacological VTE agent (>24hrs) due to surgical blood loss or risk of bleeding: not applicable

## 2011-08-15 NOTE — Discharge Instructions (Signed)
May shower starting tomorrow  May remove bandage on Sunday, but leave steri-strips in place   Implanted [place] Instructions An implanted port is a central line that has a round shape and is placed under the skin. It is used for long-term IV (intravenous) access for:  Medicine.   Fluids.   Liquid nutrition, such as TPN (total parenteral nutrition).   Blood samples.  Ports can be placed:  In the chest area just below the collarbone (this is the most common place.)   In the arms.   In the belly (abdomen) area.   In the legs.  PARTS OF THE PORT A port has 2 main parts:  The reservoir. The reservoir is round, disc-shaped, and will be a small, raised area under your skin.   The reservoir is the part where a needle is inserted (accessed) to either give medicines or to draw blood.   The catheter. The catheter is a long, slender tube that extends from the reservoir. The catheter is placed into a large vein.   Medicine that is inserted into the reservoir goes into the catheter and then into the vein.  INSERTION OF THE PORT  The port is surgically placed in either an operating room or in a procedural area (interventional radiology).   Medicine may be given to help you relax during the procedure.   The skin where the port will be inserted is numbed (local anesthetic).   1 or 2 small cuts (incisions) will be made in the skin to insert the port.   The port can be used after it has been inserted.  INCISION SITE CARE  The incision site may have small adhesive strips on it. This helps keep the incision site closed. Sometimes, no adhesive strips are placed. Instead of adhesive strips, a special kind of surgical glue is used to keep the incision closed.   If adhesive strips were placed on the incision sites, do not take them off. They will fall off on their own.   The incision site may be sore for 1 to 2 days. Pain medicine can help.   Do not get the incision site wet. Bathe or shower  as directed by your caregiver.   The incision site should heal in 5 to 7 days. A small scar may form after the incision has healed.  ACCESSING THE PORT Special steps must be taken to access the port:  Before the port is accessed, a numbing cream can be placed on the skin. This helps numb the skin over the port site.   A sterile technique is used to access the port.   The port is accessed with a needle. Only "non-coring" port needles should be used to access the port. Once the port is accessed, a blood return should be checked. This helps ensure the port is in the vein and is not clogged (clotted).   If your caregiver believes your port should remain accessed, a clear (transparent) bandage will be placed over the needle site. The bandage and needle will need to be changed every week or as directed by your caregiver.   Keep the bandage covering the needle clean and dry. Do not get it wet. Follow your caregiver's instructions on how to take a shower or bath when the port is accessed.   If your port does not need to stay accessed, no bandage is needed over the port.  FLUSHING THE PORT Flushing the port keeps it from getting clogged. How often the port is  flushed depends on:  If a constant infusion is running. If a constant infusion is running, the port may not need to be flushed.   If intermittent medicines are given.   If the port is not being used.  For intermittent medicines:  The port will need to be flushed:   After medicines have been given.   After blood has been drawn.   As part of routine maintenance.   A port is normally flushed with:   Normal saline.   Heparin.   Follow your caregiver's advice on how often, how much, and the type of flush to use on your port.  IMPORTANT PORT INFORMATION  Tell your caregiver if you are allergic to heparin.   After your port is placed, you will get a manufacturer's information card. The card has information about your port. Keep  this card with you at all times.   There are many types of ports available. Know what kind of port you have.   In case of an emergency, it may be helpful to wear a medical alert bracelet. This can help alert health care workers that you have a port.   The port can stay in for as long as your caregiver believes it is necessary.   When it is time for the port to come out, surgery will be done to remove it. The surgery will be similar to how the port was put in.   If you are in the hospital or clinic:   Your port will be taken care of and flushed by a nurse.   If you are at home:   A home health care nurse may give medicines and take care of the port.   You or a family member can get special training and directions for giving medicine and taking care of the port at home.  SEEK IMMEDIATE MEDICAL CARE IF:   Your port does not flush or you are unable to get a blood return.   New drainage or pus is coming from the incision.   A bad smell is coming from the incision site.   You develop swelling or increased redness at the incision site.   You develop increased swelling or pain at the port site.   You develop swelling or pain in the surrounding skin near the port.   You have an oral temperature above 102 F (38.9 C), not controlled by medicine.  MAKE SURE YOU:   Understand these instructions.   Will watch your condition.   Will get help right away if you are not doing well or get worse.  Document Released: 06/02/2005 Document Revised: 02/12/2011 Document Reviewed: 08/24/2008 [facility] Patient Information 2012 [place], [place].

## 2011-08-19 ENCOUNTER — Encounter: Payer: Self-pay | Admitting: *Deleted

## 2011-08-19 ENCOUNTER — Other Ambulatory Visit: Payer: Medicare Other

## 2011-08-19 ENCOUNTER — Telehealth: Payer: Self-pay | Admitting: Genetic Counselor

## 2011-08-19 ENCOUNTER — Encounter: Payer: Self-pay | Admitting: Genetic Counselor

## 2011-08-19 NOTE — Telephone Encounter (Signed)
Called patient with BRCA test results.  No mutation was found in the BRCA/BART testing.  We could consider one of the panel tests offered through [facility].  She will think about it.  We will send a letter summarizing our conversation with a copy of the test results.

## 2011-08-20 ENCOUNTER — Ambulatory Visit (HOSPITAL_COMMUNITY): Payer: Medicare Other | Attending: Cardiology

## 2011-08-20 ENCOUNTER — Other Ambulatory Visit: Payer: Self-pay

## 2011-08-20 DIAGNOSIS — Z9221 Personal history of antineoplastic chemotherapy: Secondary | ICD-10-CM | POA: Insufficient documentation

## 2011-08-20 DIAGNOSIS — C50119 Malignant neoplasm of central portion of unspecified female breast: Secondary | ICD-10-CM

## 2011-08-20 DIAGNOSIS — I428 Other cardiomyopathies: Secondary | ICD-10-CM

## 2011-08-20 DIAGNOSIS — C50919 Malignant neoplasm of unspecified site of unspecified female breast: Secondary | ICD-10-CM | POA: Insufficient documentation

## 2011-08-20 DIAGNOSIS — E669 Obesity, unspecified: Secondary | ICD-10-CM | POA: Insufficient documentation

## 2011-08-21 ENCOUNTER — Encounter (HOSPITAL_COMMUNITY)
Admission: RE | Admit: 2011-08-21 | Discharge: 2011-08-21 | Disposition: A | Discharge status: Home / Self Care | Payer: Medicare Other | Source: Ambulatory Visit | Attending: Oncology | Admitting: Oncology

## 2011-08-21 DIAGNOSIS — N63 Unspecified lump in unspecified breast: Secondary | ICD-10-CM | POA: Insufficient documentation

## 2011-08-21 DIAGNOSIS — C50919 Malignant neoplasm of unspecified site of unspecified female breast: Secondary | ICD-10-CM | POA: Insufficient documentation

## 2011-08-21 DIAGNOSIS — C778 Secondary and unspecified malignant neoplasm of lymph nodes of multiple regions: Secondary | ICD-10-CM | POA: Insufficient documentation

## 2011-08-21 DIAGNOSIS — I7789 Other specified disorders of arteries and arterioles: Secondary | ICD-10-CM | POA: Insufficient documentation

## 2011-08-21 LAB — GLUCOSE, CAPILLARY: Glucose-Capillary: 114 mg/dL — ABNORMAL HIGH (ref 70–99)

## 2011-08-21 MED ORDER — FLUDEOXYGLUCOSE F - 18 (FDG) INJECTION
18.7000 | Freq: Once | INTRAVENOUS | Status: AC | PRN
  Administered 2011-08-21: 18.7 via INTRAVENOUS

## 2011-08-25 ENCOUNTER — Ambulatory Visit (HOSPITAL_BASED_OUTPATIENT_CLINIC_OR_DEPARTMENT_OTHER): Payer: Medicare Other | Admitting: Oncology

## 2011-08-25 ENCOUNTER — Telehealth: Payer: Self-pay | Admitting: *Deleted

## 2011-08-25 ENCOUNTER — Other Ambulatory Visit: Payer: Medicare Other | Admitting: Lab

## 2011-08-25 ENCOUNTER — Encounter (HOSPITAL_COMMUNITY): Payer: Self-pay | Admitting: Surgery

## 2011-08-25 VITALS — BP 166/87 | HR 87 | Temp 98.3°F | Ht 68.0 in | Wt 275.6 lb

## 2011-08-25 DIAGNOSIS — C50119 Malignant neoplasm of central portion of unspecified female breast: Secondary | ICD-10-CM

## 2011-08-25 MED ORDER — PROCHLORPERAZINE MALEATE 10 MG PO TABS: 10.0000 mg | ORAL_TABLET | Freq: Four times a day (QID) | ORAL | Status: DC | PRN

## 2011-08-25 MED ORDER — LIDOCAINE-PRILOCAINE 2.5-2.5 % EX CREA: TOPICAL_CREAM | Freq: Once | CUTANEOUS | Status: DC

## 2011-08-25 MED ORDER — LORAZEPAM 0.5 MG PO TABS: 0.5000 mg | ORAL_TABLET | Freq: Every evening | ORAL | Status: AC | PRN

## 2011-08-25 MED ORDER — ONDANSETRON HCL 8 MG PO TABS: 8.0000 mg | ORAL_TABLET | Freq: Two times a day (BID) | ORAL | Status: DC | PRN

## 2011-08-25 MED ORDER — DEXAMETHASONE 4 MG PO TABS: 4.0000 mg | ORAL_TABLET | Freq: Two times a day (BID) | ORAL | Status: DC

## 2011-08-25 NOTE — Telephone Encounter (Signed)
gave patient appointments for 08-2011 thru 09-2011 printed out calendar and gave to the patient sent an email to [facility] requesting the patient's chemo times

## 2011-08-25 NOTE — Progress Notes (Signed)
ID: Linda [name]   DOB: 1945-12-12  MR#: [id number]  [name]#:[id number]  HISTORY OF PRESENT ILLNESS: The patient noted a small amount of drainage from her left breast December of 2012. She brought it to her gynecologist's attention in January of 2013 and was set up for bilateral diagnostic mammography at the breast Center every seventh 2013. This was the patient's first ever mammogram. It showed a large irregular mass in the left retroareolar region extending to the nipple, with nipple retraction and skin thickening. This measured approximately 8.4 cm. It was associated with pleomorphic microcalcifications. By exam there was moderate distortion and retraction of the nipple with a large palpable ill-defined area in the retroareolar region. In the right right breast there was also a 2 cm hard mass palpated. Ultrasound of the right breast mass showed a complex cystic/solid area measuring 1.9 cm. Ultrasound of the right axilla was negative. Ultrasound of the left breast showed a large hypoechoic mass measuring at least 3.8 cm. The left axilla showed several adjacent abnormal appearing lymph nodes.  With this information biopsy of the right and left breast masses were obtained the same day, and showed  ([id number])     (a) on the right, and invasive ductal carcinoma with papillary features, estrogen and progesterone receptor positive, HER-2 negative, with an MIB-1 of 10%.    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 The MIB-1 was 60% for the left-sided tumor.  With this information the patient was presented at the multidisciplinary breast cancer conference 08/06/2011. Subsequent evaluation and treatments are as detailed below.  INTERVAL HISTORY: [name] returns today with her son Thayer Ohm (who works as a IT sales professional) for followup of her breast cancer. Since her last visit here she had her staging scans with a CT  of the chest and PET scan showing only local regional disease. There was no evidence of metastatic disease. She also had an echocardiogram which shows an excellent ejection fraction at baseline.  REVIEW OF SYSTEMS: She also had her port placed. She tolerated that well. There was no unusual of pain, bleeding, swelling, fever, or other complications. She does tell me that her port will had expired" by the time it was placed. I am not really sure what this means and we will have to investigate that but at any rate that is the port she has in and we will try to do the best we can with that. A detailed review of systems was otherwise negative  PAST MEDICAL HISTORY: History of microscopic hematuria as noted History of prolapsed bladder.  PAST SURGICAL HISTORY: No prior surgeries  FAMILY HISTORY The patient's father died from a heart attack at the age of 49. The patient's mother died from apparently heart problems at the age of 40. The patient had no brothers. She has 3 sisters. One of her sisters was diagnosed with breast cancer in her mid 39s. The patient does not know if his sister was ever genetically tested. The patient's mother had a mastectomy at the age of 75, presumably for breast cancer. There is no other history of breast or in cancer in the family to her knowledge.  GYNECOLOGIC HISTORY: She does not recall when she had menarche. She had her first child at age 32. She is GX P3. She underwent menopause in her mid 51s. She never took hormone replacement.  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 She moved to [place] about 2 years ago but has a Museum/gallery curator in [place]. Son Thayer Ohm lives in [place] and works as a IT sales professional. His wife is a Engineer, civil (consulting). Son Onalee Hua lives at [employer] and is a Art therapist in addition to having the  [employer]. The patient attends a local [employer] here   ADVANCED DIRECTIVES: Not in place  HEALTH MAINTENANCE: History  Substance Use Topics  . Smoking status: Never Smoker   . Smokeless tobacco: Not on file  . Alcohol Use: Yes     rare- occ.     Colonoscopy: Never   PAP: 07/24/2011  Bone density: Never  Lipid panel:  No Known Allergies  Current Outpatient Prescriptions  Medication Sig Dispense Refill  . Multiple Vitamin (MULTIVITAMIN) capsule Take 1 capsule by mouth daily. States only daily-will stop today.      [name] HYDROcodone-acetaminophen (NORCO) 5-325 MG per tablet Take 1-2 tablets by mouth every 4 (four) hours as needed for pain.  30 tablet  1    OBJECTIVE: [name] in no acute distress Filed Vitals:   08/25/11 1501  BP: 166/87  Pulse: 87  Temp: 98.3 F (36.8 C)     Body mass index is 41.90 kg/(m^2).    ECOG FS: 0  Sclerae unicteric Oropharynx clear The right axilla is clear. The left axilla shows of one fairly easily palpable lymph node which measures approximately 1/2 cm. It is movable, nontender, nonerythematous  Lungs no rales or rhonchi Heart regular rate and rhythm Abd obese, benign MSK no focal spinal tenderness, no peripheral edema Neuro: nonfocal Breasts: The right breast shows no skin or nipple changes. There is a hard palpable mass in the lower outer quadrant measuring perhaps 2 cm, and easily movable.. In the left breast the areola is abnormal, and I photographed it today for the medical record to serve as a baseline.. The nipple is not retracted. The mass in the left breast measures well over 5 cm by palpation. It is movable.  LAB RESULTS: Lab Results  Component Value Date   WBC 5.9 08/06/2011   NEUTROABS 3.5 08/06/2011   HGB 12.6 08/06/2011   HCT 36.9 08/06/2011   MCV 92.0 08/06/2011   PLT 248 08/06/2011      Chemistry      Component Value Date/Time   NA 139 08/06/2011 1206   K 3.8 08/06/2011 1206   CL 103  08/06/2011 1206   CO2 29 08/06/2011 1206   BUN 10 08/06/2011 1206   CREATININE 0.56 08/06/2011 1206      Component Value Date/Time   CALCIUM 9.5 08/06/2011 1206   ALKPHOS 90 08/06/2011 1206   AST 16 08/06/2011 1206   ALT 13 08/06/2011 1206   BILITOT 0.3 08/06/2011 1206       Lab Results  Component Value Date   LABCA2 87* 08/06/2011    No results found for this basename: INR:1;PROTIME:1 in the last 168 hours  No results found for this basename: UACOL:1,UAPR:1,USPG:1,UPH:1,UTP:1,UGL:1,UKET:1,UBIL:1,UHGB:1,UNIT:1,UROB:1,ULEU:1,UEPI:1,UWBC:1,URBC:1,UBAC:1,CAST:1,CRYS:1,UCOM:1,BILUA:1 in the last 72 hours   STUDIES: as noted above  ASSESSMENT: [name] status post bilateral breast biopsies 07/24/2011, showing, on the right, a clinical T2 N0 papillary/ductal breast cancer, estrogen and progesterone receptor positive, HER-2 negative, with an MIB-1 of 10%; on the left, a T3 N1 invasive ductal carcinoma, grade 3, triple  positive, with an MIB-1 of 60%.  PLAN: She is now ready to start her chemotherapy, which will consist of cyclophosphamide and doxorubicin given in dose dense fashion x4, to be followed by paclitaxel and trastuzumab again every 2 weeks x4, with the trastuzumab continue to complete a year. When she completes the 8 weeks of chemotherapy as she will be ready for surgery. She will need postmastectomy radiation on the left at least. I have referred her to Dr. Nicholes Mango as we do all are HER-2 positive patients, to operationalize her cardiologic followup.   Today I wrote her prescriptions for lorazepam prochlorperazine ondansetron and dexamethasone, and gave her written instructions on how to take them. I also wrote her for the EMLA cream. Hopefully we can start her chemotherapy tomorrow. She will return to see [place] next week to assess tolerance and to optimize the following treatments  MAGRINAT,GUSTAV C    08/25/2011

## 2011-08-26 ENCOUNTER — Telehealth: Payer: Self-pay | Admitting: Oncology

## 2011-08-26 ENCOUNTER — Ambulatory Visit (HOSPITAL_BASED_OUTPATIENT_CLINIC_OR_DEPARTMENT_OTHER): Payer: Medicare Other

## 2011-08-26 ENCOUNTER — Encounter: Payer: Self-pay | Admitting: Oncology

## 2011-08-26 VITALS — BP 158/96 | HR 68 | Temp 97.0°F

## 2011-08-26 DIAGNOSIS — C50119 Malignant neoplasm of central portion of unspecified female breast: Secondary | ICD-10-CM

## 2011-08-26 DIAGNOSIS — C50519 Malignant neoplasm of lower-outer quadrant of unspecified female breast: Secondary | ICD-10-CM

## 2011-08-26 DIAGNOSIS — C50219 Malignant neoplasm of upper-inner quadrant of unspecified female breast: Secondary | ICD-10-CM

## 2011-08-26 DIAGNOSIS — Z5111 Encounter for antineoplastic chemotherapy: Secondary | ICD-10-CM

## 2011-08-26 MED ORDER — DEXAMETHASONE SODIUM PHOSPHATE 4 MG/ML IJ SOLN
12.0000 mg | Freq: Once | INTRAMUSCULAR | Status: AC
  Administered 2011-08-26: 20 mg via INTRAVENOUS

## 2011-08-26 MED ORDER — SODIUM CHLORIDE 0.9 % IJ SOLN
10.0000 mL | INTRAMUSCULAR | Status: DC | PRN
  Administered 2011-08-26: 10 mL
  Filled 2011-08-26: qty 10

## 2011-08-26 MED ORDER — HEPARIN SOD (PORK) LOCK FLUSH 100 UNIT/ML IV SOLN
500.0000 [IU] | Freq: Once | INTRAVENOUS | Status: AC | PRN
  Administered 2011-08-26: 500 [IU]
  Filled 2011-08-26: qty 5

## 2011-08-26 MED ORDER — DOXORUBICIN HCL CHEMO IV INJECTION 2 MG/ML
60.0000 mg/m2 | Freq: Once | INTRAVENOUS | Status: AC
  Administered 2011-08-26: 148 mg via INTRAVENOUS
  Filled 2011-08-26: qty 74

## 2011-08-26 MED ORDER — PALONOSETRON HCL INJECTION 0.25 MG/5ML
0.2500 mg | Freq: Once | INTRAVENOUS | Status: AC
  Administered 2011-08-26: 0.25 mg via INTRAVENOUS

## 2011-08-26 MED ORDER — SODIUM CHLORIDE 0.9 % IV SOLN
150.0000 mg | Freq: Once | INTRAVENOUS | Status: AC
  Administered 2011-08-26: 150 mg via INTRAVENOUS
  Filled 2011-08-26: qty 5

## 2011-08-26 MED ORDER — SODIUM CHLORIDE 0.9 % IV SOLN
600.0000 mg/m2 | Freq: Once | INTRAVENOUS | Status: AC
  Administered 2011-08-26: 1480 mg via INTRAVENOUS
  Filled 2011-08-26: qty 74

## 2011-08-26 MED ORDER — SODIUM CHLORIDE 0.9 % IV SOLN
Freq: Once | INTRAVENOUS | Status: AC
  Administered 2011-08-26: 13:00:00 via INTRAVENOUS

## 2011-08-26 NOTE — Patient Instructions (Signed)
[facility] Discharge Instructions for Patients Receiving Chemotherapy  Today you received the following chemotherapy agents adriamycin and cytoxan  To help prevent nausea and vomiting after your treatment, we encourage you to take your nausea medication per MD instructions Begin taking it at 7pm and take it as often as prescribed for the next 48 hours.   If you develop nausea and vomiting that is not controlled by your nausea medication, call the clinic. If it is after clinic hours your family physician or the after hours number for the clinic or go to the Emergency Department.   BELOW ARE SYMPTOMS THAT SHOULD BE REPORTED IMMEDIATELY:  *FEVER GREATER THAN 100.5 F  *CHILLS WITH OR WITHOUT FEVER  NAUSEA AND VOMITING THAT IS NOT CONTROLLED WITH YOUR NAUSEA MEDICATION  *UNUSUAL SHORTNESS OF BREATH  *UNUSUAL BRUISING OR BLEEDING  TENDERNESS IN MOUTH AND THROAT WITH OR WITHOUT PRESENCE OF ULCERS  *URINARY PROBLEMS  *BOWEL PROBLEMS  UNUSUAL RASH Items with * indicate a potential emergency and should be followed up as soon as possible.  One of the nurses will contact you 24 hours after your treatment. Please let the nurse know about any problems that you may have experienced. Feel free to call the clinic you have any questions or concerns. The clinic phone number is [phone].   I have been informed and understand all the instructions given to me. I know to contact the clinic, my physician, or go to the Emergency Department if any problems should occur. I do not have any questions at this time, but understand that I may call the clinic during office hours   should I have any questions or need assistance in obtaining follow up care.    __________________________________________  _____________  __________ Signature of Patient or Authorized Representative            Date                   Time    __________________________________________ Nurse's  Signature

## 2011-08-26 NOTE — Progress Notes (Signed)
Patient and her daughter came by after her mom treatment today,and had question about medicaid application,so i gave them an application to fill out, and they said they would return it to the department of social service.

## 2011-08-26 NOTE — Telephone Encounter (Signed)
Returned patients phone call Spoke with patient daughter they will come by to see me today after treatment to discuss insurance.

## 2011-08-27 ENCOUNTER — Telehealth: Payer: Self-pay | Admitting: *Deleted

## 2011-08-27 ENCOUNTER — Ambulatory Visit (HOSPITAL_BASED_OUTPATIENT_CLINIC_OR_DEPARTMENT_OTHER): Payer: Medicare Other

## 2011-08-27 VITALS — BP 170/80 | HR 91 | Temp 97.8°F

## 2011-08-27 DIAGNOSIS — C50119 Malignant neoplasm of central portion of unspecified female breast: Secondary | ICD-10-CM

## 2011-08-27 DIAGNOSIS — C50519 Malignant neoplasm of lower-outer quadrant of unspecified female breast: Secondary | ICD-10-CM

## 2011-08-27 DIAGNOSIS — C50219 Malignant neoplasm of upper-inner quadrant of unspecified female breast: Secondary | ICD-10-CM

## 2011-08-27 MED ORDER — PEGFILGRASTIM INJECTION 6 MG/0.6ML
6.0000 mg | Freq: Once | SUBCUTANEOUS | Status: AC
  Administered 2011-08-27: 6 mg via SUBCUTANEOUS
  Filled 2011-08-27: qty 0.6

## 2011-08-27 NOTE — Telephone Encounter (Signed)
Message copied by Augusto Garbe on Wed Aug 27, 2011  2:25 PM ------      Message from: Yetta Glassman H      Created: Tue Aug 26, 2011 12:50 PM      Regarding: chemo follow-up call       Adria cytoxan, [phone] cell

## 2011-08-27 NOTE — Telephone Encounter (Signed)
Message left requesting a return call for chemo f/u. 

## 2011-08-31 ENCOUNTER — Other Ambulatory Visit: Payer: Self-pay | Admitting: Oncology

## 2011-09-02 ENCOUNTER — Encounter (HOSPITAL_COMMUNITY): Payer: Self-pay

## 2011-09-02 ENCOUNTER — Other Ambulatory Visit: Payer: Self-pay | Admitting: *Deleted

## 2011-09-02 ENCOUNTER — Ambulatory Visit (HOSPITAL_COMMUNITY)
Admission: RE | Admit: 2011-09-02 | Discharge: 2011-09-02 | Disposition: A | Discharge status: Home / Self Care | Payer: Medicare Other | Source: Ambulatory Visit | Attending: Internal Medicine | Admitting: Internal Medicine

## 2011-09-02 VITALS — BP 136/68 | HR 105 | Ht 68.0 in | Wt 271.8 lb

## 2011-09-02 DIAGNOSIS — C50119 Malignant neoplasm of central portion of unspecified female breast: Secondary | ICD-10-CM

## 2011-09-02 DIAGNOSIS — I10 Essential (primary) hypertension: Secondary | ICD-10-CM | POA: Insufficient documentation

## 2011-09-02 MED ORDER — LISINOPRIL-HYDROCHLOROTHIAZIDE 20-12.5 MG PO TABS: 1.0000 | ORAL_TABLET | Freq: Every day | ORAL | Status: DC

## 2011-09-02 NOTE — Assessment & Plan Note (Addendum)
Discussed role of cardio-oncology clinic at length.. Given that she is receiving both doxorubicin and Herceptin she is at increased risk for cardiotoxicity and this risk is likely increased by HTN and prbable OSA. I have reviewed ECHO EF 60-65% LVH lateral S' 8.5. Due to elevated SBP and tendency toward edema will start lisinopril/HCTZ 20/12.5 mg daily. Anitcipate starting Carvedilol at next visit. She also snores will set up outpatient sleep study at next visit. Will repeat ECHO every 3 months with follow up.

## 2011-09-02 NOTE — Patient Instructions (Signed)
Take Prinzide daily  Follow up in 6 weeks  Follow in 3 months with an ECHO

## 2011-09-02 NOTE — Assessment & Plan Note (Addendum)
Previous vital signs reveiwed. SBP > 140 over the last month. Will start Prinzide 20/12.5 mg daily. Discussed potential side effects such as cough and hypokalemia. BMET in 2 weeks at the cancer center. Follow up in 6 weeks.

## 2011-09-03 ENCOUNTER — Other Ambulatory Visit (HOSPITAL_BASED_OUTPATIENT_CLINIC_OR_DEPARTMENT_OTHER): Payer: Medicare Other | Admitting: Lab

## 2011-09-03 ENCOUNTER — Telehealth: Payer: Self-pay | Admitting: Oncology

## 2011-09-03 ENCOUNTER — Ambulatory Visit (HOSPITAL_BASED_OUTPATIENT_CLINIC_OR_DEPARTMENT_OTHER): Payer: Medicare Other | Admitting: Physician Assistant

## 2011-09-03 ENCOUNTER — Encounter: Payer: Self-pay | Admitting: Oncology

## 2011-09-03 ENCOUNTER — Encounter: Payer: Self-pay | Admitting: Physician Assistant

## 2011-09-03 VITALS — BP 141/82 | HR 92 | Temp 98.3°F | Ht 68.0 in | Wt 272.2 lb

## 2011-09-03 DIAGNOSIS — C50919 Malignant neoplasm of unspecified site of unspecified female breast: Secondary | ICD-10-CM

## 2011-09-03 DIAGNOSIS — C50119 Malignant neoplasm of central portion of unspecified female breast: Secondary | ICD-10-CM

## 2011-09-03 DIAGNOSIS — Z09 Encounter for follow-up examination after completed treatment for conditions other than malignant neoplasm: Secondary | ICD-10-CM

## 2011-09-03 DIAGNOSIS — Z170 Estrogen receptor positive status [ER+]: Secondary | ICD-10-CM

## 2011-09-03 LAB — CBC WITH DIFFERENTIAL/PLATELET
Basophils Absolute: 0 10*3/uL (ref 0.0–0.1)
Eosinophils Absolute: 0.1 10*3/uL (ref 0.0–0.5)
HCT: 34.9 % (ref 34.8–46.6)
MCH: 31.6 pg (ref 25.1–34.0)
MONO#: 0.2 10*3/uL (ref 0.1–0.9)
MONO%: 9.3 % (ref 0.0–14.0)
RBC: 3.78 10*6/uL (ref 3.70–5.45)
RDW: 12.9 % (ref 11.2–14.5)

## 2011-09-03 NOTE — Telephone Encounter (Signed)
gve the pt her April 2013 appt calendar 

## 2011-09-03 NOTE — Telephone Encounter (Signed)
Pt has her mri breast appt for april

## 2011-09-03 NOTE — Progress Notes (Signed)
ID: Linda [name]   DOB: [birth year]  MR#: [id number]  NWG#:[id number]  HISTORY OF PRESENT ILLNESS: The patient noted a small amount of drainage from her left breast December of 2012. She brought it to her gynecologist's attention in January of 2013 and was set up for bilateral diagnostic mammography at the breast Center every seventh 2013. This was the patient's first ever mammogram. It showed a large irregular mass in the left retroareolar region extending to the nipple, with nipple retraction and skin thickening. This measured approximately 8.4 cm. It was associated with pleomorphic microcalcifications. By exam there was moderate distortion and retraction of the nipple with a large palpable ill-defined area in the retroareolar region. In the right right breast there was also a 2 cm hard mass palpated. Ultrasound of the right breast mass showed a complex cystic/solid area measuring 1.9 cm. Ultrasound of the right axilla was negative. Ultrasound of the left breast showed a large hypoechoic mass measuring at least 3.8 cm. The left axilla showed several adjacent abnormal appearing lymph nodes.  With this information biopsy of the right and left breast masses were obtained the same day, and showed ([id number])  (a) on the right, and invasive ductal carcinoma with papillary features, estrogen and progesterone receptor positive, HER-2 negative, with an MIB-1 of 10%.  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 The MIB-1 was 60% for the left-sided tumor.  With this information the patient was presented at the multidisciplinary breast cancer conference 08/06/2011. Subsequent evaluation and treatments are as detailed below.  INTERVAL HISTORY: Linda [name] returns today for assessment chemotoxicity on day 9 cycle 1 of 4 planned dose dense cycles of doxorubicin/cyclophosphamide being given in the neoadjuvant setting for  bilateral breast carcinomas. Received Neulasta on day 2 for granulocyte support. She tolerated the treatment extremely well, and really has very few complaints today. She took all of her antinausea medications appropriately.  REVIEW OF SYSTEMS: Linda [name] had no problems with nausea or emesis. Her appetite has been increased. She denies any reflux, and no change in bowel habits. No fevers, chills, or night sweats. She is a little fatigued, but only mildly so. No chest pain or shortness of breath. No abnormal headaches or dizziness.  A detailed review of systems is otherwise noncontributory.  PAST MEDICAL HISTORY: Past Medical History  Diagnosis Date  . Cancer 08-13-11    07-31-11-Dx. Bilarteral Breast cancer-left greater than rt.  . Hematuria, undiagnosed cause 08-13-11    Being evaluated by [facility] 08-14-11    PAST SURGICAL HISTORY: Past Surgical History  Procedure Date  . Child birth 08-13-11    x3 -NVD  . Portacath placement 08/15/2011    Procedure: INSERTION PORT-A-CATH;  Surgeon: Shelly Rubenstein, MD;  Location: [facility];  Service: General;  Laterality: N/A;    FAMILY HISTORY Family History  Problem Relation Age of Onset  . Heart disease Mother   . Heart attack Father     GYNECOLOGIC HISTORY:  She does not recall when she had menarche. She had her first child at age 3. She is GX P3. She underwent menopause in her mid 40s. She never took hormone replacement.   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 She moved to  [place] about 2 years ago but has a Museum/gallery curator in [place]. Son Thayer Ohm lives in [place] and works as a IT sales professional. His wife is a Engineer, civil (consulting). Son Onalee Hua lives at [employer] and is a Art therapist in addition to having the [employer]. The patient attends a local [employer] here    ADVANCED DIRECTIVES: Not in place  HEALTH MAINTENANCE: History  Substance Use Topics  . Smoking status: Never Smoker   . Smokeless tobacco: Not on file  . Alcohol Use: Yes     rare- occ.     Colonoscopy:  PAP:  Bone density:  Lipid panel:  No Known Allergies  Current Outpatient Prescriptions  Medication Sig Dispense Refill  . dexamethasone (DECADRON) 4 MG tablet Take 4 mg by mouth every 14 (fourteen) days.      [name] DOXOrubicin (ADRIAMYCIN) 10 MG SOLR Inject 148 mg into the vein every 14 (fourteen) days.      [name] lisinopril-hydrochlorothiazide (PRINZIDE) 20-12.5 MG per tablet Take 1 tablet by mouth daily.  30 tablet  6  . LORazepam (ATIVAN) 0.5 MG tablet Take 1 tablet (0.5 mg total) by mouth at bedtime as needed for anxiety.  30 tablet  0  . Multiple Vitamin (MULTIVITAMIN) capsule Take 1 capsule by mouth daily. States only daily-will stop today.      . ondansetron (ZOFRAN) 8 MG tablet Take 8 mg by mouth every 12 (twelve) hours as needed.      . prochlorperazine (COMPAZINE) 10 MG tablet Take 10 mg by mouth every 6 (six) hours as needed.      . sodium chloride 0.9 % SOLN 500 mL with cyclophosphamide 2 G SOLR Inject into the vein 2 (two) times a week.        OBJECTIVE: Filed Vitals:   09/03/11 1351  BP: 141/82  Pulse: 92  Temp: 98.3 F (36.8 C)     Body mass index is 41.39 kg/(m^2).    ECOG FS: 0  Physical Exam: HEENT:  Sclerae anicteric, conjunctivae pink.  Oropharynx clear.  No mucositis or candidiasis.   Nodes:  No cervical, supraclavicular, or axillary lymphadenopathy palpated.  Breast Exam: Deferred Lungs:  Clear to auscultation bilaterally.  No crackles, rhonchi, or wheezes.   Heart:  Regular rate and rhythm.   Abdomen:  Soft, obese, nontender.  Positive bowel sounds.  No organomegaly or masses palpated.   Musculoskeletal:  No focal spinal tenderness to palpation.  Extremities:  Benign.  No peripheral edema or cyanosis.   Skin:  Benign.   Neuro:  Nonfocal. Alert and  oriented x3.   LAB RESULTS: Lab Results  Component Value Date   WBC 2.2* 09/03/2011   NEUTROABS 1.0* 09/03/2011   HGB 11.9 09/03/2011   HCT 34.9 09/03/2011   MCV 92.3 09/03/2011   PLT 119* 09/03/2011      Chemistry      Component Value Date/Time   NA 139 08/06/2011 1206   K 3.8 08/06/2011 1206   CL 103 08/06/2011 1206   CO2 29 08/06/2011 1206   BUN 10 08/06/2011 1206   CREATININE 0.56 08/06/2011 1206      Component Value Date/Time   CALCIUM 9.5 08/06/2011 1206   ALKPHOS 90 08/06/2011 1206   AST 16 08/06/2011 1206   ALT 13 08/06/2011 1206   BILITOT 0.3 08/06/2011 1206       Lab Results  Component Value Date   LABCA2 87* 08/06/2011    STUDIES: No new studies found.  ASSESSMENT: [name]   (  1)  status post bilateral breast biopsies 07/24/2011, showing, on the right, a clinical T2 N0 papillary/ductal breast cancer, estrogen and progesterone receptor positive, HER-2 negative, with an MIB-1 of 10%; on the left, a T3 N1 invasive ductal carcinoma, grade 3, triple positive, with an MIB-1 of 60%.  (2)  now being treated in the neoadjuvant setting, the goal being to complete 4 dose dense cycles of doxorubicin/cyclophosphamide, followed by 4 dose dense cycles of paclitaxel and trastuzumab, to trastuzumab then continued for a total of one year.  (3)  the patient will need postmastectomy radiation at least on the left.  PLAN: Linda [name] tolerated this first cycle quite well and will return next week on March 26 for her second cycle. I will see her the following week on April 3 for assessment of chemotoxicity. She knows to call prior to that time with any changes or problems.  Linda [name]    09/03/2011

## 2011-09-09 ENCOUNTER — Other Ambulatory Visit: Payer: Medicare Other | Admitting: Lab

## 2011-09-09 ENCOUNTER — Other Ambulatory Visit: Payer: Self-pay | Admitting: Physician Assistant

## 2011-09-09 ENCOUNTER — Ambulatory Visit (HOSPITAL_BASED_OUTPATIENT_CLINIC_OR_DEPARTMENT_OTHER): Payer: Medicare Other

## 2011-09-09 VITALS — BP 136/75 | HR 85 | Temp 98.2°F

## 2011-09-09 DIAGNOSIS — C50119 Malignant neoplasm of central portion of unspecified female breast: Secondary | ICD-10-CM

## 2011-09-09 DIAGNOSIS — Z5111 Encounter for antineoplastic chemotherapy: Secondary | ICD-10-CM

## 2011-09-09 LAB — CBC WITH DIFFERENTIAL/PLATELET
Basophils Absolute: 0 10*3/uL (ref 0.0–0.1)
Eosinophils Absolute: 0 10*3/uL (ref 0.0–0.5)
HCT: 37.1 % (ref 34.8–46.6)
LYMPH%: 31.7 % (ref 14.0–49.7)
MCV: 93 fL (ref 79.5–101.0)
MONO#: 0.4 10*3/uL (ref 0.1–0.9)
MONO%: 7.9 % (ref 0.0–14.0)
NEUT#: 2.7 10*3/uL (ref 1.5–6.5)
NEUT%: 59.3 % (ref 38.4–76.8)
Platelets: 239 10*3/uL (ref 145–400)
RBC: 3.99 10*6/uL (ref 3.70–5.45)
WBC: 4.6 10*3/uL (ref 3.9–10.3)

## 2011-09-09 MED ORDER — DEXAMETHASONE SODIUM PHOSPHATE 4 MG/ML IJ SOLN
12.0000 mg | Freq: Once | INTRAMUSCULAR | Status: AC
  Administered 2011-09-09: 12 mg via INTRAVENOUS

## 2011-09-09 MED ORDER — SODIUM CHLORIDE 0.9 % IJ SOLN
10.0000 mL | INTRAMUSCULAR | Status: DC | PRN
  Administered 2011-09-09: 10 mL
  Filled 2011-09-09: qty 10

## 2011-09-09 MED ORDER — SODIUM CHLORIDE 0.9 % IV SOLN
600.0000 mg/m2 | Freq: Once | INTRAVENOUS | Status: AC
  Administered 2011-09-09: 1480 mg via INTRAVENOUS
  Filled 2011-09-09: qty 74

## 2011-09-09 MED ORDER — HEPARIN SOD (PORK) LOCK FLUSH 100 UNIT/ML IV SOLN
500.0000 [IU] | Freq: Once | INTRAVENOUS | Status: AC | PRN
  Administered 2011-09-09: 500 [IU]
  Filled 2011-09-09: qty 5

## 2011-09-09 MED ORDER — DOXORUBICIN HCL CHEMO IV INJECTION 2 MG/ML
60.0000 mg/m2 | Freq: Once | INTRAVENOUS | Status: AC
  Administered 2011-09-09: 148 mg via INTRAVENOUS
  Filled 2011-09-09: qty 74

## 2011-09-09 MED ORDER — PALONOSETRON HCL INJECTION 0.25 MG/5ML
0.2500 mg | Freq: Once | INTRAVENOUS | Status: AC
  Administered 2011-09-09: 0.25 mg via INTRAVENOUS

## 2011-09-09 MED ORDER — SODIUM CHLORIDE 0.9 % IV SOLN
Freq: Once | INTRAVENOUS | Status: AC
  Administered 2011-09-09: 11:00:00 via INTRAVENOUS

## 2011-09-09 MED ORDER — SODIUM CHLORIDE 0.9 % IV SOLN
150.0000 mg | Freq: Once | INTRAVENOUS | Status: AC
  Administered 2011-09-09: 150 mg via INTRAVENOUS
  Filled 2011-09-09: qty 5

## 2011-09-10 ENCOUNTER — Ambulatory Visit (HOSPITAL_BASED_OUTPATIENT_CLINIC_OR_DEPARTMENT_OTHER): Payer: Medicare Other

## 2011-09-10 VITALS — BP 159/75 | HR 90 | Temp 97.0°F

## 2011-09-10 DIAGNOSIS — C50119 Malignant neoplasm of central portion of unspecified female breast: Secondary | ICD-10-CM

## 2011-09-10 DIAGNOSIS — Z5189 Encounter for other specified aftercare: Secondary | ICD-10-CM

## 2011-09-10 MED ORDER — PEGFILGRASTIM INJECTION 6 MG/0.6ML
6.0000 mg | Freq: Once | SUBCUTANEOUS | Status: AC
  Administered 2011-09-10: 6 mg via SUBCUTANEOUS
  Filled 2011-09-10: qty 0.6

## 2011-09-13 NOTE — Progress Notes (Signed)
Patient ID: Linda [name], female   DOB: 1946/04/26, 66 y.o.   MRN: [id number]  Oncologist: Dr Darnelle Catalan Referring Physician: Dr Darnelle Catalan  Reason for referral: Herceptin HPI:  Linda [name] is a [name] status post bilateral breast biopsies 07/24/2011, showing, on the right, a clinical T2 N0 papillary/ductal breast cancer, estrogen and progesterone receptor positive, HER-2 negative, with an MIB-1 of 10%; on the left, a T3 N1 invasive ductal carcinoma, grade 3, triple positive, with an MIB-1 of 60%. She will begin  cyclophosphamide and doxorubicin  in dose dense fashion x4, to be followed by paclitaxel and trastuzumab again every 2 weeks x4, with the trastuzumab continue to complete a year. When she completes the 8 weeks of chemotherapy as she will be ready for surgery.   She is referred to the cardio-oncology clinic for surveillance monitoring during her chemotherapy.  She denies any known h/o cardiac disease. She says she has never been formally diagnosed with HTN but BP up at times. No CP/SOB/orthopnea or PND. Occasional edema.  She is obese and does snore a lot.   08/20/11 ECHO EF 60-65% Lateral S' 8.5 LVH (I reviewed personally)    Review of Systems:     Cardiac Review of Systems: {Y] = yes [ ]  = no  Chest Pain [    ]  Resting SOB [   ] Exertional SOB  [  ]  Orthopnea [  ]   Pedal Edema [   ]    Palpitations [  ] Syncope  [  ]   Presyncope [   ]  General Review of Systems: [Y] = yes [  ]=no Constitional: recent weight change [  ]; anorexia [  ]; fatigue [  ]; nausea [  ]; night sweats [  ]; fever [  ]; or chills [  ];                                                                                                                                          Dental: poor dentition[  ]; Last Dentist visit:   Eye : blurred vision [  ]; diplopia [   ]; vision changes [  ];  Amaurosis fugax[  ]; Resp: cough [  ];  wheezing[  ];  hemoptysis[  ]; shortness of breath[  ]; paroxysmal  nocturnal dyspnea[  ]; dyspnea on exertion[  ]; or orthopnea[  ];  GI:  gallstones[  ], vomiting[  ];  dysphagia[  ]; melena[  ];  hematochezia [  ]; heartburn[  ];    GU: kidney stones [  ]; hematuria[  ];   dysuria [  ];  nocturia[  ];  history of     obstruction [  ];                 Skin: rash, swelling[  ];, hair loss[  ];  peripheral edema[  ];  or itching[  ]; Musculosketetal: myalgias[  ];  joint swelling[  ];  joint erythema[  ];  joint pain[  ];  back pain[  ];  Heme/Lymph: bruising[  ];  bleeding[  ];  anemia[  ];  Neuro: TIA[  ];  headaches[  ];  stroke[  ];  vertigo[  ];  seizures[  ];   paresthesias[  ];  difficulty walking[  ];  Psych:depression[  ]; anxiety[  ];  Endocrine: diabetes[  ];  thyroid dysfunction[  ];   Other:    Past Medical History  Diagnosis Date  . Cancer 08-13-11    07-31-11-Dx. Bilarteral Breast cancer-left greater than rt.  . Hematuria, undiagnosed cause 08-13-11    Being evaluated by [facility] 08-14-11    Current Outpatient Prescriptions  Medication Sig Dispense Refill  . DOXOrubicin (ADRIAMYCIN) 10 MG SOLR Inject 148 mg into the vein every 14 (fourteen) days.      . Multiple Vitamin (MULTIVITAMIN) capsule Take 1 capsule by mouth daily. States only daily-will stop today.      . ondansetron (ZOFRAN) 8 MG tablet Take 8 mg by mouth every 12 (twelve) hours as needed.      . prochlorperazine (COMPAZINE) 10 MG tablet Take 10 mg by mouth every 6 (six) hours as needed.      . sodium chloride 0.9 % SOLN 500 mL with cyclophosphamide 2 G SOLR Inject into the vein 2 (two) times a week.      [name] lisinopril-hydrochlorothiazide (PRINZIDE) 20-12.5 MG per tablet Take 1 tablet by mouth daily.  30 tablet  6     No Known Allergies  History   Social History  . Marital Status: Single    Spouse Name: N/A    Number of Children: N/A  . Years of Education: N/A   Occupational History  . Not on file.   Social History Main Topics  . Smoking status: Never Smoker    . Smokeless tobacco: Not on file  . Alcohol Use: Yes     rare- occ.  . Drug Use: No  . Sexually Active: No   Other Topics Concern  . Not on file   Social History Narrative  . No narrative on file    Family History  Problem Relation Age of Onset  . Heart disease Mother   . Heart attack Father     PHYSICAL EXAM: Filed Vitals:   09/02/11 0906  BP: 136/68  Pulse: 105  Height: 5\' 8"  (1.727 m)  Weight: 271 lb 12 oz (123.265 kg)  SpO2: 98%   General:  Obese. Well appearing. No respiratory difficulty HEENT: normal Neck: supple. no obviousJVD. Carotids 2+ bilat; no bruits. No lymphadenopathy or thryomegaly appreciated. Cor: PMI nondisplaced. Regular rate & rhythm. No rubs, gallops or murmurs. Lungs: clear Abdomen: soft, obese nontender, nondistended.No bruits or masses. Good bowel sounds. Extremities: no cyanosis, clubbing, rash, edema Neuro: alert & oriented x 3, cranial nerves grossly intact. moves all 4 extremities w/o difficulty. Affect pleasant.  ASSESSMENT & PLAN:

## 2011-09-16 ENCOUNTER — Ambulatory Visit (INDEPENDENT_AMBULATORY_CARE_PROVIDER_SITE_OTHER): Payer: Medicare Other | Admitting: Surgery

## 2011-09-16 VITALS — BP 138/82 | HR 120 | Temp 96.8°F | Resp 16 | Ht 67.0 in | Wt 269.0 lb

## 2011-09-16 DIAGNOSIS — Z09 Encounter for follow-up examination after completed treatment for conditions other than malignant neoplasm: Secondary | ICD-10-CM

## 2011-09-16 NOTE — Progress Notes (Signed)
Subjective:     Patient ID: Linda [name], female   DOB: May 07, 1946, 66 y.o.   MRN: [id number]  HPI She is here for her followup visit status post Port-A-Cath insertion. Again, she has bilateral breast cancers with the left cancer being quite significant. She has now had 2 treatments of chemotherapy through the port and it is working very well and she has had no problems. The day of surgery, after the port was placed, it was realized that the port date had expired. The port was on the end. I notified the patient of this. We also spoke to the company that made the port regarding this and they felt port was fine and could be left in. I gave the patient the option of exchanging the port. She felt this was unnecessary and one to leave the current port in.  Review of Systems     Objective:   Physical Exam On exam, the port site is well healed    Assessment:     Patient with bilateral breast cancer status post Port-A-Cath insertion    Plan:     I again gave her the option of a change the Port-A-Cath but she would like to leave it in place as is working well. She will continue her chemotherapy and I will see her back in 3 months

## 2011-09-17 ENCOUNTER — Ambulatory Visit (HOSPITAL_BASED_OUTPATIENT_CLINIC_OR_DEPARTMENT_OTHER): Payer: Medicare Other | Admitting: Physician Assistant

## 2011-09-17 ENCOUNTER — Encounter: Payer: Self-pay | Admitting: Physician Assistant

## 2011-09-17 ENCOUNTER — Telehealth (HOSPITAL_COMMUNITY): Payer: Self-pay | Admitting: Vascular Surgery

## 2011-09-17 ENCOUNTER — Other Ambulatory Visit: Payer: Medicare Other | Admitting: Lab

## 2011-09-17 VITALS — BP 145/82 | HR 108 | Temp 99.0°F | Ht 67.0 in | Wt 271.0 lb

## 2011-09-17 DIAGNOSIS — C50119 Malignant neoplasm of central portion of unspecified female breast: Secondary | ICD-10-CM

## 2011-09-17 DIAGNOSIS — D709 Neutropenia, unspecified: Secondary | ICD-10-CM

## 2011-09-17 DIAGNOSIS — Z170 Estrogen receptor positive status [ER+]: Secondary | ICD-10-CM

## 2011-09-17 LAB — CBC WITH DIFFERENTIAL/PLATELET
BASO%: 3.3 % — ABNORMAL HIGH (ref 0.0–2.0)
Basophils Absolute: 0 10*3/uL (ref 0.0–0.1)
EOS%: 1.6 % (ref 0.0–7.0)
HCT: 31.1 % — ABNORMAL LOW (ref 34.8–46.6)
HGB: 10.5 g/dL — ABNORMAL LOW (ref 11.6–15.9)
LYMPH%: 34.2 % (ref 14.0–49.7)
MCH: 31.3 pg (ref 25.1–34.0)
MCHC: 33.9 g/dL (ref 31.5–36.0)
MCV: 92.4 fL (ref 79.5–101.0)
NEUT%: 27.8 % — ABNORMAL LOW (ref 38.4–76.8)
Platelets: 137 10*3/uL — ABNORMAL LOW (ref 145–400)
lymph#: 0.4 10*3/uL — ABNORMAL LOW (ref 0.9–3.3)

## 2011-09-17 MED ORDER — CIPROFLOXACIN HCL 500 MG PO TABS: 500.0000 mg | ORAL_TABLET | Freq: Two times a day (BID) | ORAL | Status: DC

## 2011-09-17 NOTE — Telephone Encounter (Signed)
Linda [name]  Called her blood pressure meds  Is making her cough she cant sleep @ night . Dr. Jesusita Oka told her that he would change it if she had this side effect. She would like to talk to someone.                   Thanks                           [employer]

## 2011-09-17 NOTE — Progress Notes (Signed)
ID: Linda [name]   DOB: 1945-08-23  MR#: [id number]  WUJ#:[id number]  HISTORY OF PRESENT ILLNESS: The patient noted a small amount of drainage from her left breast December of 2012. She brought it to her gynecologist's attention in January of 2013 and was set up for bilateral diagnostic mammography at the breast Center every seventh 2013. This was the patient's first ever mammogram. It showed a large irregular mass in the left retroareolar region extending to the nipple, with nipple retraction and skin thickening. This measured approximately 8.4 cm. It was associated with pleomorphic microcalcifications. By exam there was moderate distortion and retraction of the nipple with a large palpable ill-defined area in the retroareolar region. In the right right breast there was also a 2 cm hard mass palpated. Ultrasound of the right breast mass showed a complex cystic/solid area measuring 1.9 cm. Ultrasound of the right axilla was negative. Ultrasound of the left breast showed a large hypoechoic mass measuring at least 3.8 cm. The left axilla showed several adjacent abnormal appearing lymph nodes.  With this information biopsy of the right and left breast masses were obtained the same day, and showed ([id number])  (a) on the right, and invasive ductal carcinoma with papillary features, estrogen and progesterone receptor positive, HER-2 negative, with an MIB-1 of 10%.  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 The MIB-1 was 60% for the left-sided tumor.  With this information the patient was presented at the multidisciplinary breast cancer conference 08/06/2011. Subsequent evaluation and treatments are as detailed below.  INTERVAL HISTORY: Linda [name] returns today for assessment chemotoxicity on day 9 cycle 2 of 4 planned dose dense cycles of doxorubicin/cyclophosphamide being given in the neoadjuvant setting for  bilateral breast carcinomas. She received Neulasta on day 2 for granulocyte support. She tolerated the treatment extremely well, and took all of her antinausea medications appropriately. In fact her biggest complaint today is actually a dry cough but be associated with her lisinopril. This has been discontinued, and she is waiting to hear from her cardiologist to begin a new medication.  REVIEW OF SYSTEMS: Linda [name] had no problems with nausea or emesis. She had a little diarrhea after her Easter meal, but this resolved quickly. She's had no additional changes in bowel habits. No fevers, chills, or night sweats. No rashes or abnormal bleeding. No increased shortness of breath.  A detailed review of systems is otherwise noncontributory.  PAST MEDICAL HISTORY: Past Medical History  Diagnosis Date  . Cancer 08-13-11    07-31-11-Dx. Bilarteral Breast cancer-left greater than rt.  . Hematuria, undiagnosed cause 08-13-11    Being evaluated by [facility] 08-14-11    PAST SURGICAL HISTORY: Past Surgical History  Procedure Date  . Child birth 08-13-11    x3 -NVD  . Portacath placement 08/15/2011    Procedure: INSERTION PORT-A-CATH;  Surgeon: Shelly Rubenstein, MD;  Location: [facility];  Service: General;  Laterality: N/A;    FAMILY HISTORY Family History  Problem Relation Age of Onset  . Heart disease Mother   . Heart attack Father     GYNECOLOGIC HISTORY:  She does not recall when she had menarche. She had her first child at age 66. She is GX P3. She underwent menopause in her mid 30s. She never took hormone replacement.   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 She moved to [place] about 2 years ago but has a Museum/gallery curator in [place]. Son Thayer Ohm lives in [place] and works as a IT sales professional. His wife is a Engineer, civil (consulting). Son Onalee Hua lives at  [employer] and is a Art therapist in addition to having the [employer]. The patient attends a local [employer] here   ADVANCED DIRECTIVES: Not in place  HEALTH MAINTENANCE: History  Substance Use Topics  . Smoking status: Never Smoker   . Smokeless tobacco: Not on file  . Alcohol Use: Yes     rare- occ.     Colonoscopy:  PAP:  Bone density:  Lipid panel:  Allergies  Allergen Reactions  . Ace Inhibitors Cough    Current Outpatient Prescriptions  Medication Sig Dispense Refill  . DOXOrubicin (ADRIAMYCIN) 10 MG SOLR Inject 148 mg into the vein every 14 (fourteen) days.      . Multiple Vitamin (MULTIVITAMIN) capsule Take 1 capsule by mouth daily. States only daily-will stop today.      . ondansetron (ZOFRAN) 8 MG tablet Take 8 mg by mouth every 12 (twelve) hours as needed.      . prochlorperazine (COMPAZINE) 10 MG tablet Take 10 mg by mouth every 6 (six) hours as needed.      . sodium chloride 0.9 % SOLN 500 mL with cyclophosphamide 2 G SOLR Inject into the vein 2 (two) times a week.      . ciprofloxacin (CIPRO) 500 MG tablet Take 1 tablet (500 mg total) by mouth 2 (two) times daily.  14 tablet  3    OBJECTIVE: Filed Vitals:   09/17/11 1415  BP: 145/82  Pulse: 108  Temp: 99 F (37.2 C)     Body mass index is 42.44 kg/(m^2).    ECOG FS: 0  Physical Exam: HEENT:  Sclerae anicteric, conjunctivae pink.  Oropharynx clear.  No mucositis or candidiasis.   Nodes:  No cervical, supraclavicular, or axillary lymphadenopathy palpated.  Breast Exam: Deferred Lungs:  Clear to auscultation bilaterally.  No crackles, rhonchi, or wheezes.   Heart:  Regular rate and rhythm.   Abdomen:  Soft, obese, nontender.  Positive bowel sounds.  No organomegaly or masses palpated.   Musculoskeletal:  No focal spinal tenderness to palpation.  Extremities:  Benign.  No peripheral edema or cyanosis.   Skin:  Benign.   Neuro:  Nonfocal. Alert and oriented x3.   LAB  RESULTS: Lab Results  Component Value Date   WBC 1.0* 09/17/2011   NEUTROABS 0.3* 09/17/2011   HGB 10.5* 09/17/2011   HCT 31.1* 09/17/2011   MCV 92.4 09/17/2011   PLT 137* 09/17/2011      Chemistry      Component Value Date/Time   NA 139 08/06/2011 1206   K 3.8 08/06/2011 1206   CL 103 08/06/2011 1206   CO2 29 08/06/2011 1206   BUN 10 08/06/2011 1206   CREATININE 0.56 08/06/2011 1206      Component Value Date/Time   CALCIUM 9.5 08/06/2011 1206   ALKPHOS 90 08/06/2011 1206   AST 16 08/06/2011 1206   ALT 13 08/06/2011 1206   BILITOT 0.3 08/06/2011 1206       Lab Results  Component Value Date   LABCA2 87* 08/06/2011    STUDIES: No new studies found.  ASSESSMENT: [name]   (1)  status post bilateral breast biopsies 07/24/2011,  showing, on the right, a clinical T2 N0 papillary/ductal breast cancer, estrogen and progesterone receptor positive, HER-2 negative, with an MIB-1 of 10%; on the left, a T3 N1 invasive ductal carcinoma, grade 3, triple positive, with an MIB-1 of 60%.  (2)  now being treated in the neoadjuvant setting, the goal being to complete 4 dose dense cycles of doxorubicin/cyclophosphamide, followed by 4 dose dense cycles of paclitaxel and trastuzumab, to trastuzumab then continued for a total of one year.  (3)  the patient will need postmastectomy radiation at least on the left.  PLAN: We spent time today reviewing neutropenic precautions for Pat's afebrile neutropenia, and she was started on Cipro prophylactically at 500 mg twice a day for 7 days. She understands to contact us with any temps of 100 or above.  Otherwise, I will see Linda [name] again next week on April 9 prior to initiating her third cycle of chemotherapy. She voices her understanding, and knows to call with any changes or problems.   Aletta Edmunds    09/17/2011

## 2011-09-17 NOTE — Telephone Encounter (Signed)
Per Ulyess Blossom, PA can switch to Hyzar 50/12.5 have Left message for pt to call back

## 2011-09-18 MED ORDER — LOSARTAN POTASSIUM-HCTZ 50-12.5 MG PO TABS: 1.0000 | ORAL_TABLET | Freq: Every day | ORAL | Status: DC

## 2011-09-18 NOTE — Telephone Encounter (Signed)
Pt is aware and new rx sent in for Hyzar

## 2011-09-23 ENCOUNTER — Telehealth: Payer: Self-pay | Admitting: *Deleted

## 2011-09-23 ENCOUNTER — Ambulatory Visit (HOSPITAL_BASED_OUTPATIENT_CLINIC_OR_DEPARTMENT_OTHER): Payer: Medicare Other | Admitting: Physician Assistant

## 2011-09-23 ENCOUNTER — Ambulatory Visit (HOSPITAL_BASED_OUTPATIENT_CLINIC_OR_DEPARTMENT_OTHER): Payer: Medicare Other

## 2011-09-23 ENCOUNTER — Other Ambulatory Visit (HOSPITAL_BASED_OUTPATIENT_CLINIC_OR_DEPARTMENT_OTHER): Payer: Medicare Other | Admitting: Lab

## 2011-09-23 ENCOUNTER — Encounter: Payer: Self-pay | Admitting: Physician Assistant

## 2011-09-23 VITALS — BP 143/82 | HR 108 | Temp 98.0°F | Ht 67.0 in | Wt 269.1 lb

## 2011-09-23 DIAGNOSIS — C50519 Malignant neoplasm of lower-outer quadrant of unspecified female breast: Secondary | ICD-10-CM

## 2011-09-23 DIAGNOSIS — C50119 Malignant neoplasm of central portion of unspecified female breast: Secondary | ICD-10-CM

## 2011-09-23 DIAGNOSIS — R05 Cough: Secondary | ICD-10-CM

## 2011-09-23 DIAGNOSIS — Z5111 Encounter for antineoplastic chemotherapy: Secondary | ICD-10-CM

## 2011-09-23 DIAGNOSIS — C773 Secondary and unspecified malignant neoplasm of axilla and upper limb lymph nodes: Secondary | ICD-10-CM

## 2011-09-23 DIAGNOSIS — J069 Acute upper respiratory infection, unspecified: Secondary | ICD-10-CM

## 2011-09-23 DIAGNOSIS — C50019 Malignant neoplasm of nipple and areola, unspecified female breast: Secondary | ICD-10-CM

## 2011-09-23 LAB — CBC WITH DIFFERENTIAL/PLATELET
Basophils Absolute: 0 10*3/uL (ref 0.0–0.1)
EOS%: 0 % (ref 0.0–7.0)
Eosinophils Absolute: 0 10*3/uL (ref 0.0–0.5)
LYMPH%: 21.6 % (ref 14.0–49.7)
MCH: 30.5 pg (ref 25.1–34.0)
MCV: 90.5 fL (ref 79.5–101.0)
MONO%: 8.1 % (ref 0.0–14.0)
NEUT#: 4.1 10*3/uL (ref 1.5–6.5)
Platelets: 147 10*3/uL (ref 145–400)
RBC: 3.67 10*6/uL — ABNORMAL LOW (ref 3.70–5.45)

## 2011-09-23 LAB — COMPREHENSIVE METABOLIC PANEL
AST: 31 U/L (ref 0–37)
Alkaline Phosphatase: 80 U/L (ref 39–117)
BUN: 11 mg/dL (ref 6–23)
Glucose, Bld: 132 mg/dL — ABNORMAL HIGH (ref 70–99)
Sodium: 137 mEq/L (ref 135–145)
Total Bilirubin: 0.3 mg/dL (ref 0.3–1.2)

## 2011-09-23 MED ORDER — SODIUM CHLORIDE 0.9 % IV SOLN: Freq: Once | INTRAVENOUS | Status: DC

## 2011-09-23 MED ORDER — SODIUM CHLORIDE 0.9 % IV SOLN
600.0000 mg/m2 | Freq: Once | INTRAVENOUS | Status: AC
  Administered 2011-09-23: 1480 mg via INTRAVENOUS
  Filled 2011-09-23: qty 74

## 2011-09-23 MED ORDER — DOXORUBICIN HCL CHEMO IV INJECTION 2 MG/ML
60.0000 mg/m2 | Freq: Once | INTRAVENOUS | Status: AC
  Administered 2011-09-23: 148 mg via INTRAVENOUS
  Filled 2011-09-23: qty 74

## 2011-09-23 MED ORDER — GUAIFENESIN-CODEINE 100-10 MG/5ML PO SYRP: 5.0000 mL | ORAL_SOLUTION | Freq: Three times a day (TID) | ORAL | Status: AC | PRN

## 2011-09-23 MED ORDER — SODIUM CHLORIDE 0.9 % IV SOLN
150.0000 mg | Freq: Once | INTRAVENOUS | Status: AC
  Administered 2011-09-23: 150 mg via INTRAVENOUS
  Filled 2011-09-23: qty 5

## 2011-09-23 MED ORDER — DEXAMETHASONE SODIUM PHOSPHATE 4 MG/ML IJ SOLN
12.0000 mg | Freq: Once | INTRAMUSCULAR | Status: AC
  Administered 2011-09-23: 12 mg via INTRAVENOUS

## 2011-09-23 MED ORDER — PALONOSETRON HCL INJECTION 0.25 MG/5ML
0.2500 mg | Freq: Once | INTRAVENOUS | Status: AC
  Administered 2011-09-23: 0.25 mg via INTRAVENOUS

## 2011-09-23 NOTE — Progress Notes (Signed)
ID: Linda [name]   DOB: 1946-01-11  MR#: [id number]  WUJ#:[id number]  HISTORY OF PRESENT ILLNESS: The patient noted a small amount of drainage from her left breast December of 2012. She brought it to her gynecologist's attention in January of 2013 and was set up for bilateral diagnostic mammography at the breast Center every seventh 2013. This was the patient's first ever mammogram. It showed a large irregular mass in the left retroareolar region extending to the nipple, with nipple retraction and skin thickening. This measured approximately 8.4 cm. It was associated with pleomorphic microcalcifications. By exam there was moderate distortion and retraction of the nipple with a large palpable ill-defined area in the retroareolar region. In the right right breast there was also a 2 cm hard mass palpated. Ultrasound of the right breast mass showed a complex cystic/solid area measuring 1.9 cm. Ultrasound of the right axilla was negative. Ultrasound of the left breast showed a large hypoechoic mass measuring at least 3.8 cm. The left axilla showed several adjacent abnormal appearing lymph nodes.  With this information biopsy of the right and left breast masses were obtained the same day, and showed ([id number])  (a) on the right, and invasive ductal carcinoma with papillary features, estrogen and progesterone receptor positive, HER-2 negative, with an MIB-1 of 10%.  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 The MIB-1 was 60% for the left-sided tumor.  With this information the patient was presented at the multidisciplinary breast cancer conference 08/06/2011. Subsequent evaluation and treatments are as detailed below.  INTERVAL HISTORY: Linda [name] returns today due for day 1 cycle 3 of 4 planned dose dense cycles of doxorubicin/cyclophosphamide being given in the neoadjuvant setting for bilateral breast  carcinomas. She receives Neulasta on day 2 for granulocyte support.  Since her visit here one week ago, Linda [name] has completed a course of prophylactic Cipro for afebrile neutropenia. She's had a slight sore throat earlier in the week, and this morning woke up with laryngitis. The sore throat has really resolved. She's having no nasal congestion, sinus congestion, fevers, chills, or night sweats. She has a mild cough during the day, but it is especially problematic at night when she is trying to sleep. It is productive only of a small amount of clear phlegm. No hemoptysis. No shortness of breath.   REVIEW OF SYSTEMS: With regards to the chemotherapy itself, Linda [name] is tolerating treatment very well with few complaints. She has had no problems with nausea or emesis. She's eating and drinking, and keeping herself well hydrated. No change in bowel habits. No abnormal headaches or dizziness. No mouth ulcers or oral sensitivity. No rashes or abnormal bleeding.  A detailed review of systems is otherwise noncontributory.  PAST MEDICAL HISTORY: Past Medical History  Diagnosis Date  . Cancer 08-13-11    07-31-11-Dx. Bilarteral Breast cancer-left greater than rt.  . Hematuria, undiagnosed cause 08-13-11    Being evaluated by [facility] 08-14-11    PAST SURGICAL HISTORY: Past Surgical History  Procedure Date  . Child birth 08-13-11    x3 -NVD  . Portacath placement 08/15/2011    Procedure: INSERTION PORT-A-CATH;  Surgeon: Shelly Rubenstein, MD;  Location: [facility];  Service: General;  Laterality: N/A;    FAMILY HISTORY Family History  Problem Relation Age of Onset  . Heart disease Mother   . Heart attack Father     GYNECOLOGIC HISTORY:  She does not  recall when she had menarche. She had her first child at age 84. She is GX P3. She underwent menopause in her mid 66s. She never took hormone replacement.   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 She moved to [place] about 2 years ago but has a Museum/gallery curator in [place]. Son Thayer Ohm lives in [place] and works as a IT sales professional. His wife is a Engineer, civil (consulting). Son Onalee Hua lives at [employer] and is a Art therapist in addition to having the [employer]. The patient attends a local [employer] here   ADVANCED DIRECTIVES: Not in place  HEALTH MAINTENANCE: History  Substance Use Topics  . Smoking status: Never Smoker   . Smokeless tobacco: Not on file  . Alcohol Use: Yes     rare- occ.     Colonoscopy:  PAP:  Bone density:  Lipid panel:  Allergies  Allergen Reactions  . Ace Inhibitors Cough    Current Outpatient Prescriptions  Medication Sig Dispense Refill  . ciprofloxacin (CIPRO) 500 MG tablet Take 1 tablet (500 mg total) by mouth 2 (two) times daily.  14 tablet  3  . DOXOrubicin (ADRIAMYCIN) 10 MG SOLR Inject 148 mg into the vein every 14 (fourteen) days.      [name] guaiFENesin-codeine (ROBITUSSIN AC) 100-10 MG/5ML syrup Take 5 mLs by mouth 3 (three) times daily as needed for cough.  240 mL  0  . losartan-hydrochlorothiazide (HYZAAR) 50-12.5 MG per tablet Take 1 tablet by mouth daily.  30 tablet  6  . Multiple Vitamin (MULTIVITAMIN) capsule Take 1 capsule by mouth daily. States only daily-will stop today.      . ondansetron (ZOFRAN) 8 MG tablet Take 8 mg by mouth every 12 (twelve) hours as needed.      . prochlorperazine (COMPAZINE) 10 MG tablet Take 10 mg by mouth every 6 (six) hours as needed.      . sodium chloride 0.9 % SOLN 500 mL with cyclophosphamide 2 G SOLR Inject into the vein 2 (two) times a week.       No current facility-administered medications for this visit.   Facility-Administered Medications Ordered in Other Visits  Medication Dose Route Frequency Provider Last Rate Last Dose  . 0.9 %  sodium chloride infusion   Intravenous Once Arrion Burruel Allegra Grana,  PA      . cyclophosphamide (CYTOXAN) 1,480 mg in sodium chloride 0.9 % 250 mL chemo infusion  600 mg/m2 (Treatment Plan Actual) Intravenous Once Mithra Spano Allegra Grana, PA      . dexamethasone (DECADRON) injection 12 mg  12 mg Intravenous Once Catalina Gravel, PA   12 mg at 09/23/11 1110  . DOXOrubicin (ADRIAMYCIN) chemo injection 148 mg  60 mg/m2 (Treatment Plan Actual) Intravenous Once Rewa Weissberg Allegra Grana, PA      . fosaprepitant (EMEND) 150 mg in sodium chloride 0.9 % 145 mL IVPB  150 mg Intravenous Once Catalina Gravel, PA 300 mL/hr at 09/23/11 1110 150 mg at 09/23/11 1110  . palonosetron (ALOXI) injection 0.25 mg  0.25 mg Intravenous Once Catalina Gravel, PA   0.25 mg at 09/23/11 1110    OBJECTIVE: Filed Vitals:   09/23/11 0959  BP: 143/82  Pulse: 108  Temp: 98 F (36.7 C)     Body mass index is 42.15 kg/(m^2).  ECOG FS: 0  Physical Exam: HEENT:  Sclerae anicteric, conjunctivae pink.  Oropharynx clear.  No mucositis or candidiasis.  No pharyngeal erythema or exudate Nodes:  No cervical, supraclavicular, or axillary lymphadenopathy palpated.  Breast Exam: Right breast, palpable mass in the lower portion of the breast, measuring approximately 1.5 cm in diameter today. Left breast, distorted areolar complex with palpable mass measuring approximately 4-4.5 cm. Slightly softer than with previous exams. Freely movable. Lungs:  Clear to auscultation bilaterally.  No crackles, rhonchi, or wheezes.   Heart:  Regular rate and rhythm.   Abdomen:  Soft, obese, nontender.  Positive bowel sounds.  No organomegaly or masses palpated.   Musculoskeletal:  No focal spinal tenderness to palpation.  Extremities:  Benign.  No peripheral edema or cyanosis.   Skin:  Benign.   Neuro:  Nonfocal. Alert and oriented x3.   LAB RESULTS: Lab Results  Component Value Date   WBC 5.9 09/23/2011   NEUTROABS 4.1 09/23/2011   HGB 11.2* 09/23/2011   HCT 33.2* 09/23/2011   MCV 90.5 09/23/2011   PLT 147 09/23/2011      Chemistry      Component  Value Date/Time   NA 139 08/06/2011 1206   K 3.8 08/06/2011 1206   CL 103 08/06/2011 1206   CO2 29 08/06/2011 1206   BUN 10 08/06/2011 1206   CREATININE 0.56 08/06/2011 1206      Component Value Date/Time   CALCIUM 9.5 08/06/2011 1206   ALKPHOS 90 08/06/2011 1206   AST 16 08/06/2011 1206   ALT 13 08/06/2011 1206   BILITOT 0.3 08/06/2011 1206       Lab Results  Component Value Date   LABCA2 87* 08/06/2011    STUDIES: No new studies found.  ASSESSMENT: [name]   (1)  status post bilateral breast biopsies 07/24/2011, showing, on the right, a clinical T2 N0 papillary/ductal breast cancer, estrogen and progesterone receptor positive, HER-2 negative, with an MIB-1 of 10%; on the left, a T3 N1 invasive ductal carcinoma, grade 3, triple positive, with an MIB-1 of 60%.  (2)  now being treated in the neoadjuvant setting, the goal being to complete 4 dose dense cycles of doxorubicin/cyclophosphamide, followed by 4 dose dense cycles of paclitaxel and trastuzumab, to trastuzumab then continued for a total of one year.  (3)  the patient will need postmastectomy radiation at least on the left.  PLAN: Linda [name] is ready to proceed for her third dose of chemotherapy today. She will receive Neulasta tomorrow. She will start her Cipro prophylactically on day 6 which will be Sunday, April 14. I will see her next week for assessment of chemotoxicity on April 16.  I've given Pat a prescription for guaifenesin/codeine syrup to use primarily at night for her cough. I have asked her to contact us if the cough worsens, if any of her upper respiratory symptoms worsen, or if she begins to have fevers of 100 or above.   Emily Massar    09/23/2011

## 2011-09-23 NOTE — Telephone Encounter (Signed)
gave patient appointment for 10-21-2011 10-22-2011 11-05-2011 10-28-2011 printed out calendar and gave to the patient

## 2011-09-24 ENCOUNTER — Ambulatory Visit (HOSPITAL_BASED_OUTPATIENT_CLINIC_OR_DEPARTMENT_OTHER): Payer: Medicare Other

## 2011-09-24 VITALS — BP 151/71 | HR 104 | Temp 97.3°F

## 2011-09-24 DIAGNOSIS — C50119 Malignant neoplasm of central portion of unspecified female breast: Secondary | ICD-10-CM

## 2011-09-24 MED ORDER — PEGFILGRASTIM INJECTION 6 MG/0.6ML
6.0000 mg | Freq: Once | SUBCUTANEOUS | Status: AC
  Administered 2011-09-24: 6 mg via SUBCUTANEOUS
  Filled 2011-09-24: qty 0.6

## 2011-09-30 ENCOUNTER — Other Ambulatory Visit (HOSPITAL_BASED_OUTPATIENT_CLINIC_OR_DEPARTMENT_OTHER): Payer: Medicare Other | Admitting: Lab

## 2011-09-30 ENCOUNTER — Encounter: Payer: Self-pay | Admitting: Physician Assistant

## 2011-09-30 ENCOUNTER — Ambulatory Visit (HOSPITAL_BASED_OUTPATIENT_CLINIC_OR_DEPARTMENT_OTHER): Payer: Medicare Other | Admitting: Physician Assistant

## 2011-09-30 VITALS — BP 134/81 | HR 114 | Temp 99.5°F | Ht 67.0 in | Wt 267.4 lb

## 2011-09-30 DIAGNOSIS — C50119 Malignant neoplasm of central portion of unspecified female breast: Secondary | ICD-10-CM

## 2011-09-30 LAB — CBC WITH DIFFERENTIAL/PLATELET
Basophils Absolute: 0 10*3/uL (ref 0.0–0.1)
Eosinophils Absolute: 0 10*3/uL (ref 0.0–0.5)
HGB: 9.7 g/dL — ABNORMAL LOW (ref 11.6–15.9)
LYMPH%: 81.4 % — ABNORMAL HIGH (ref 14.0–49.7)
MCH: 30.2 pg (ref 25.1–34.0)
MCV: 90.7 fL (ref 79.5–101.0)
MONO%: 11.6 % (ref 0.0–14.0)
NEUT#: 0 10*3/uL — CL (ref 1.5–6.5)
NEUT%: 2.4 % — ABNORMAL LOW (ref 38.4–76.8)
Platelets: 183 10*3/uL (ref 145–400)

## 2011-09-30 MED ORDER — CIPROFLOXACIN HCL 500 MG PO TABS: 500.0000 mg | ORAL_TABLET | Freq: Two times a day (BID) | ORAL | Status: DC

## 2011-09-30 MED ORDER — MAGIC MOUTHWASH W/LIDOCAINE: 5.0000 mL | Freq: Four times a day (QID) | ORAL | Status: DC | PRN

## 2011-09-30 NOTE — Progress Notes (Signed)
ID: JESSCIA IMM   DOB: [birth year]  MR#: [id number]  NWG#:[id number]  HISTORY OF PRESENT ILLNESS: The patient noted a small amount of drainage from her left breast December of 2012. She brought it to her gynecologist's attention in January of 2013 and was set up for bilateral diagnostic mammography at the breast Center every seventh 2013. This was the patient's first ever mammogram. It showed a large irregular mass in the left retroareolar region extending to the nipple, with nipple retraction and skin thickening. This measured approximately 8.4 cm. It was associated with pleomorphic microcalcifications. By exam there was moderate distortion and retraction of the nipple with a large palpable ill-defined area in the retroareolar region. In the right right breast there was also a 2 cm hard mass palpated. Ultrasound of the right breast mass showed a complex cystic/solid area measuring 1.9 cm. Ultrasound of the right axilla was negative. Ultrasound of the left breast showed a large hypoechoic mass measuring at least 3.8 cm. The left axilla showed several adjacent abnormal appearing lymph nodes.  With this information biopsy of the right and left breast masses were obtained the same day, and showed ([id number])  (a) on the right, and invasive ductal carcinoma with papillary features, estrogen and progesterone receptor positive, HER-2 negative, with an MIB-1 of 10%.  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 The MIB-1 was 60% for the left-sided tumor.  With this information the patient was presented at the multidisciplinary breast cancer conference 08/06/2011. Subsequent evaluation and treatments are as detailed below.  INTERVAL HISTORY: Linda [name] returns today for assessment of chemotoxicity on day 8 cycle 3 of 4 planned dose dense cycles of doxorubicin/cyclophosphamide being given in the neoadjuvant setting  for bilateral breast carcinomas. She receives Neulasta on day 2 for granulocyte support. Despite Neulasta, Linda [name] has a history of afebrile neutropenia. She forgot to start her prophylactic Cipro on day 6 as we had discussed. Since her appointment last week, she has continued to have a sore throat, slightly worse than it was one week ago. She also has a cough which has been treated effectively with guafenesin/codeine syrup. She denies any fevers, chills, or night sweats. No wheezing or shortness of breath. The laryngitis has improved.  REVIEW OF SYSTEMS: Linda [name] denies any rashes, skin changes, or abnormal bleeding. She's had no nausea and no change in bowel habits. She's keeping herself well hydrated. No abnormal headaches or dizziness. No mouth ulcers. No peripheral swelling.  A detailed review of systems is otherwise noncontributory.  PAST MEDICAL HISTORY: Past Medical History  Diagnosis Date  . Cancer 08-13-11    07-31-11-Dx. Bilarteral Breast cancer-left greater than rt.  . Hematuria, undiagnosed cause 08-13-11    Being evaluated by [facility] 08-14-11    PAST SURGICAL HISTORY: Past Surgical History  Procedure Date  . Child birth 08-13-11    x3 -NVD  . Portacath placement 08/15/2011    Procedure: INSERTION PORT-A-CATH;  Surgeon: Shelly Rubenstein, MD;  Location: [facility];  Service: General;  Laterality: N/A;    FAMILY HISTORY Family History  Problem Relation Age of Onset  . Heart disease Mother   . Heart attack Father     GYNECOLOGIC HISTORY:  She does not recall when she had menarche. She had her first child at age 62. She is GX P3. She underwent menopause in her mid 72s. She never took hormone replacement.   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 She moved to [place] about 2 years ago but has a Museum/gallery curator in  [place]. Son Thayer Ohm lives in [place] and works as a IT sales professional. His wife is a Engineer, civil (consulting). Son Onalee Hua lives at [employer] and is a Art therapist in addition to having the [employer]. The patient attends a local [employer] here   ADVANCED DIRECTIVES: Not in place  HEALTH MAINTENANCE: History  Substance Use Topics  . Smoking status: Never Smoker   . Smokeless tobacco: Not on file  . Alcohol Use: Yes     rare- occ.     Colonoscopy:  PAP:  Bone density:  Lipid panel:  Allergies  Allergen Reactions  . Ace Inhibitors Cough    Current Outpatient Prescriptions  Medication Sig Dispense Refill  . ciprofloxacin (CIPRO) 500 MG tablet Take 1 tablet (500 mg total) by mouth 2 (two) times daily.  14 tablet  3  . DOXOrubicin (ADRIAMYCIN) 10 MG SOLR Inject 148 mg into the vein every 14 (fourteen) days.      [name] guaiFENesin-codeine (ROBITUSSIN AC) 100-10 MG/5ML syrup Take 5 mLs by mouth 3 (three) times daily as needed for cough.  240 mL  0  . losartan-hydrochlorothiazide (HYZAAR) 50-12.5 MG per tablet Take 1 tablet by mouth daily.  30 tablet  6  . Multiple Vitamin (MULTIVITAMIN) capsule Take 1 capsule by mouth daily. States only daily-will stop today.      . ondansetron (ZOFRAN) 8 MG tablet Take 8 mg by mouth every 12 (twelve) hours as needed.      . prochlorperazine (COMPAZINE) 10 MG tablet Take 10 mg by mouth every 6 (six) hours as needed.      . sodium chloride 0.9 % SOLN 500 mL with cyclophosphamide 2 G SOLR Inject into the vein 2 (two) times a week.        OBJECTIVE: Filed Vitals:   09/30/11 1355  BP: 134/81  Pulse: 114  Temp: 99.5 F (37.5 C)     Body mass index is 41.88 kg/(m^2).    ECOG FS: 0  Physical Exam: HEENT:  Sclerae anicteric, conjunctivae pink.  Oropharynx clear.  No mucositis or candidiasis.  Mild pharyngeal erythema, no exudate.  Nodes:  No cervical, supraclavicular, or axillary lymphadenopathy palpated.  Breast Exam: Deferred Lungs:  Clear to  auscultation bilaterally.  No crackles, rhonchi, or wheezes.   Heart:  Regular rate and rhythm.   Abdomen:  Soft, obese, nontender.  Positive bowel sounds.  No organomegaly or masses palpated.   Musculoskeletal:  No focal spinal tenderness to palpation.  Extremities:  Benign.  No peripheral edema or cyanosis.   Skin:  Benign.   Neuro:  Nonfocal. Alert and oriented x3.   LAB RESULTS: Lab Results  Component Value Date   WBC 0.4* 09/30/2011   NEUTROABS 0.0* 09/30/2011   HGB 9.7* 09/30/2011   HCT 29.1* 09/30/2011   MCV 90.7 09/30/2011   PLT 183 09/30/2011      Chemistry      Component Value Date/Time   NA 137 09/23/2011 0947   K 3.4* 09/23/2011 0947   CL 96 09/23/2011 0947   CO2 30 09/23/2011 0947   BUN 11 09/23/2011 0947   CREATININE 0.61 09/23/2011 0947      Component Value Date/Time   CALCIUM  9.2 09/23/2011 0947   ALKPHOS 80 09/23/2011 0947   AST 31 09/23/2011 0947   ALT 26 09/23/2011 0947   BILITOT 0.3 09/23/2011 0947       Lab Results  Component Value Date   LABCA2 87* 08/06/2011    STUDIES: No new studies found.  ASSESSMENT: [name]   (1)  status post bilateral breast biopsies 07/24/2011, showing, on the right, a clinical T2 N0 papillary/ductal breast cancer, estrogen and progesterone receptor positive, HER-2 negative, with an MIB-1 of 10%; on the left, a T3 N1 invasive ductal carcinoma, grade 3, triple positive, with an MIB-1 of 60%.  (2)  now being treated in the neoadjuvant setting, the goal being to complete 4 dose dense cycles of doxorubicin/cyclophosphamide, followed by 4 dose dense cycles of paclitaxel and trastuzumab, to trastuzumab then continued for a total of one year.  (3)  Afebrile chemo-induced neutropenia  (3)  the patient will need postmastectomy radiation at least on the left.  PLAN: Linda [name] and I have reviewed neutropenic precautions, and she will now start on her Cipro, 500 mg by mouth twice a day x7 days. (Of note, with cycle 4, she will begin the  Cipro on day 6 as an extra preventative measure.) In the meanwhile, she knows to call us with any temps of 100 or above. She was also given a prescription today for Magic mouthwash to swish or gargle as needed for the sore throat.  She is scheduled for repeat echocardiogram next week and will see me again on the 23rd prior to her fourth and final dose of neoadjuvant AC.  This will be followed by repeat breast MRI to assess response to neoadjuvant chemotherapy.   Linda [name]    09/30/2011

## 2011-10-06 ENCOUNTER — Ambulatory Visit (HOSPITAL_COMMUNITY)
Admission: RE | Admit: 2011-10-06 | Discharge: 2011-10-06 | Disposition: A | Discharge status: Home / Self Care | Payer: Medicare Other | Source: Ambulatory Visit | Attending: Internal Medicine | Admitting: Internal Medicine

## 2011-10-06 VITALS — BP 114/66 | HR 100 | Wt 266.5 lb

## 2011-10-06 DIAGNOSIS — I10 Essential (primary) hypertension: Secondary | ICD-10-CM

## 2011-10-06 DIAGNOSIS — C50919 Malignant neoplasm of unspecified site of unspecified female breast: Secondary | ICD-10-CM | POA: Insufficient documentation

## 2011-10-06 DIAGNOSIS — R05 Cough: Secondary | ICD-10-CM | POA: Insufficient documentation

## 2011-10-06 DIAGNOSIS — C50119 Malignant neoplasm of central portion of unspecified female breast: Secondary | ICD-10-CM

## 2011-10-06 MED ORDER — POTASSIUM CHLORIDE CRYS ER 20 MEQ PO TBCR: 20.0000 meq | EXTENDED_RELEASE_TABLET | Freq: Every day | ORAL | Status: DC

## 2011-10-06 MED ORDER — POTASSIUM CHLORIDE CRYS ER 20 MEQ PO TBCR: 20.0000 meq | EXTENDED_RELEASE_TABLET | ORAL | Status: DC | PRN

## 2011-10-06 MED ORDER — CARVEDILOL 3.125 MG PO TABS: 3.1250 mg | ORAL_TABLET | Freq: Two times a day (BID) | ORAL | Status: DC

## 2011-10-06 NOTE — Assessment & Plan Note (Addendum)
She is receiving both doxorubicin and Herceptin in the face of underlying HTN which increases her risk for chemotherapy-related cardiotoxicity. Continue Hyzaar and add Carvedilol 3.125 mg twice a day. Given dense chemotherapy up front will check echo q 2months for now.   Discussed potential side effects of beta blocker. Potassium 3.4 will add KDUR 20 meq daily.  Follow up in June with an ECHO  Given cough with no obvious volume overload will check PFTs to exclude cough-variant asthma.

## 2011-10-06 NOTE — Patient Instructions (Addendum)
Take Carvedilol 3.125 mg twice a day  Take K-Dur 20 meq daily  Follow up in June with an ECHO  Please schedule PFTs

## 2011-10-06 NOTE — Assessment & Plan Note (Addendum)
SBP improved on Hyzaar. SBP < 130. Continue Hyzaar. Adding carvedilol.

## 2011-10-06 NOTE — Assessment & Plan Note (Deleted)
Persistent cough for the last 3 weeks. Will order PFTs.

## 2011-10-07 ENCOUNTER — Ambulatory Visit (HOSPITAL_BASED_OUTPATIENT_CLINIC_OR_DEPARTMENT_OTHER): Payer: Medicare Other

## 2011-10-07 ENCOUNTER — Other Ambulatory Visit (HOSPITAL_BASED_OUTPATIENT_CLINIC_OR_DEPARTMENT_OTHER): Payer: Medicare Other | Admitting: Lab

## 2011-10-07 ENCOUNTER — Ambulatory Visit (HOSPITAL_BASED_OUTPATIENT_CLINIC_OR_DEPARTMENT_OTHER): Payer: Medicare Other | Admitting: Physician Assistant

## 2011-10-07 ENCOUNTER — Telehealth: Payer: Self-pay | Admitting: Oncology

## 2011-10-07 ENCOUNTER — Encounter: Payer: Self-pay | Admitting: Physician Assistant

## 2011-10-07 VITALS — BP 139/75 | HR 102 | Temp 97.9°F | Ht 67.0 in | Wt 265.9 lb

## 2011-10-07 DIAGNOSIS — C50119 Malignant neoplasm of central portion of unspecified female breast: Secondary | ICD-10-CM

## 2011-10-07 DIAGNOSIS — C773 Secondary and unspecified malignant neoplasm of axilla and upper limb lymph nodes: Secondary | ICD-10-CM

## 2011-10-07 DIAGNOSIS — C50519 Malignant neoplasm of lower-outer quadrant of unspecified female breast: Secondary | ICD-10-CM

## 2011-10-07 DIAGNOSIS — Z5111 Encounter for antineoplastic chemotherapy: Secondary | ICD-10-CM

## 2011-10-07 DIAGNOSIS — R5381 Other malaise: Secondary | ICD-10-CM

## 2011-10-07 LAB — CBC WITH DIFFERENTIAL/PLATELET
BASO%: 0.3 % (ref 0.0–2.0)
EOS%: 0.2 % (ref 0.0–7.0)
HCT: 28.4 % — ABNORMAL LOW (ref 34.8–46.6)
LYMPH%: 9.3 % — ABNORMAL LOW (ref 14.0–49.7)
MCH: 30 pg (ref 25.1–34.0)
MCHC: 33.1 g/dL (ref 31.5–36.0)
MCV: 90.7 fL (ref 79.5–101.0)
MONO%: 15.9 % — ABNORMAL HIGH (ref 0.0–14.0)
NEUT%: 74.3 % (ref 38.4–76.8)
Platelets: 196 10*3/uL (ref 145–400)
lymph#: 0.6 10*3/uL — ABNORMAL LOW (ref 0.9–3.3)

## 2011-10-07 LAB — COMPREHENSIVE METABOLIC PANEL
AST: 15 U/L (ref 0–37)
Alkaline Phosphatase: 80 U/L (ref 39–117)
BUN: 8 mg/dL (ref 6–23)
Creatinine, Ser: 0.55 mg/dL (ref 0.50–1.10)

## 2011-10-07 MED ORDER — HEPARIN SOD (PORK) LOCK FLUSH 100 UNIT/ML IV SOLN
500.0000 [IU] | Freq: Once | INTRAVENOUS | Status: AC | PRN
  Administered 2011-10-07: 500 [IU]
  Filled 2011-10-07: qty 5

## 2011-10-07 MED ORDER — SODIUM CHLORIDE 0.9 % IV SOLN
600.0000 mg/m2 | Freq: Once | INTRAVENOUS | Status: AC
  Administered 2011-10-07: 1480 mg via INTRAVENOUS
  Filled 2011-10-07: qty 74

## 2011-10-07 MED ORDER — DEXAMETHASONE SODIUM PHOSPHATE 4 MG/ML IJ SOLN
12.0000 mg | Freq: Once | INTRAMUSCULAR | Status: AC
  Administered 2011-10-07: 12 mg via INTRAVENOUS

## 2011-10-07 MED ORDER — SODIUM CHLORIDE 0.9 % IV SOLN
Freq: Once | INTRAVENOUS | Status: AC
  Administered 2011-10-07: 12:00:00 via INTRAVENOUS

## 2011-10-07 MED ORDER — SODIUM CHLORIDE 0.9 % IV SOLN
150.0000 mg | Freq: Once | INTRAVENOUS | Status: AC
  Administered 2011-10-07: 150 mg via INTRAVENOUS
  Filled 2011-10-07: qty 5

## 2011-10-07 MED ORDER — DOXORUBICIN HCL CHEMO IV INJECTION 2 MG/ML
60.0000 mg/m2 | Freq: Once | INTRAVENOUS | Status: AC
  Administered 2011-10-07: 148 mg via INTRAVENOUS
  Filled 2011-10-07: qty 74

## 2011-10-07 MED ORDER — SODIUM CHLORIDE 0.9 % IJ SOLN
10.0000 mL | INTRAMUSCULAR | Status: DC | PRN
  Administered 2011-10-07: 10 mL
  Filled 2011-10-07: qty 10

## 2011-10-07 MED ORDER — PALONOSETRON HCL INJECTION 0.25 MG/5ML
0.2500 mg | Freq: Once | INTRAVENOUS | Status: AC
  Administered 2011-10-07: 0.25 mg via INTRAVENOUS

## 2011-10-07 NOTE — Progress Notes (Signed)
ID: [name]   DOB: [birth year]  MR#: [id number]  MVH#:[id number]  HISTORY OF PRESENT ILLNESS: The patient noted a small amount of drainage from her left breast December of 2012. She brought it to her gynecologist's attention in January of 2013 and was set up for bilateral diagnostic mammography at the breast Center July 24, 2011. This was the patient's first ever mammogram. It showed a large irregular mass in the left retroareolar region extending to the nipple, with nipple retraction and skin thickening. This measured approximately 8.4 cm. It was associated with pleomorphic microcalcifications. By exam there was moderate distortion and retraction of the nipple with a large palpable ill-defined area in the retroareolar region. In the right right breast there was also a 2 cm hard mass palpated. Ultrasound of the right breast mass showed a complex cystic/solid area measuring 1.9 cm. Ultrasound of the right axilla was negative. Ultrasound of the left breast showed a large hypoechoic mass measuring at least 3.8 cm. The left axilla showed several adjacent abnormal appearing lymph nodes.  With this information biopsy of the right and left breast masses were obtained the same day, and showed ([id number])  (a) on the right, and invasive ductal carcinoma with papillary features, estrogen and progesterone receptor positive, HER-2 negative, with an MIB-1 of 10%.  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 The MIB-1 was 60% for the left-sided tumor.  With this information the patient was presented at the multidisciplinary breast cancer conference 08/06/2011. Subsequent evaluation and treatments are as detailed below.  INTERVAL HISTORY: Linda [name] returns today for day 1 cycle 4 of 4 planned dose dense cycles of doxorubicin/cyclophosphamide being given in the neoadjuvant setting for bilateral breast carcinomas.  She receives Neulasta on day 2 for granulocyte support. Despite Neulasta, Linda [name] has a history of afebrile neutropenia, and has recently completed a course of prophylactic Cipro following cycle 3.  Interval history is remarkable for Linda [name] having been seen by Dr. Gala Romney last week. His office has referred her for PFT later this week to followup on her shortness of breath.  REVIEW OF SYSTEMS: Fatigue is [name]'s biggest complaint today. She tells me she is having a difficult time doing anything other than walking between the bed room and the kitchen. She does have some shortness of breath with exertion. She continues to have a slight cough, less productive, and her laryngitis is improving. She's had no fevers, chills, or night sweats. No signs of abnormal bleeding.  Linda [name] also denies any nausea or change in bowel habits. No abnormal headaches, dizziness, change in vision. A detailed review of systems is otherwise noncontributory.  PAST MEDICAL HISTORY: Past Medical History  Diagnosis Date  . Cancer 08-13-11    07-31-11-Dx. Bilarteral Breast cancer-left greater than rt.  . Hematuria, undiagnosed cause 08-13-11    Being evaluated by [facility] 08-14-11    PAST SURGICAL HISTORY: Past Surgical History  Procedure Date  . Child birth 08-13-11    x3 -NVD  . Portacath placement 08/15/2011    Procedure: INSERTION PORT-A-CATH;  Surgeon: Shelly Rubenstein, MD;  Location: [facility];  Service: General;  Laterality: N/A;    FAMILY HISTORY Family History  Problem Relation Age of Onset  . Heart disease Mother   . Heart attack Father     GYNECOLOGIC HISTORY:  She does not recall when she had menarche. She had her first child at age 66. She is  GX P3. She underwent menopause in her mid 35s. She never took hormone replacement.   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 She moved to [place] about 2 years ago but has a Museum/gallery curator in [place]. Son Thayer Ohm lives in [place] and works as a IT sales professional. His wife is a Engineer, civil (consulting). Son Onalee Hua lives at [employer] and is a Art therapist in addition to having the [employer]. The patient attends a local [employer] here   ADVANCED DIRECTIVES: Not in place  HEALTH MAINTENANCE: History  Substance Use Topics  . Smoking status: Never Smoker   . Smokeless tobacco: Not on file  . Alcohol Use: Yes     rare- occ.     Colonoscopy:  PAP:  Bone density:  Lipid panel:  Allergies  Allergen Reactions  . Ace Inhibitors Cough    Current Outpatient Prescriptions  Medication Sig Dispense Refill  . Alum & Mag Hydroxide-Simeth (MAGIC MOUTHWASH W/LIDOCAINE) SOLN Take 5 mLs by mouth 4 (four) times daily as needed.  300 mL  2  . carvedilol (COREG) 3.125 MG tablet Take 1 tablet (3.125 mg total) by mouth 2 (two) times daily with a meal.  60 tablet  3  . ciprofloxacin (CIPRO) 500 MG tablet Take 1 tablet (500 mg total) by mouth 2 (two) times daily.  14 tablet  3  . DOXOrubicin (ADRIAMYCIN) 10 MG SOLR Inject 148 mg into the vein every 14 (fourteen) days.      [name] losartan-hydrochlorothiazide (HYZAAR) 50-12.5 MG per tablet Take 1 tablet by mouth daily.  30 tablet  6  . Multiple Vitamin (MULTIVITAMIN) capsule Take 1 capsule by mouth daily. States only daily-will stop today.      . ondansetron (ZOFRAN) 8 MG tablet Take 8 mg by mouth every 12 (twelve) hours as needed.      . potassium chloride SA (K-DUR,KLOR-CON) 20 MEQ tablet Take 1 tablet (20 mEq total) by mouth daily.  30 tablet  6  . prochlorperazine (COMPAZINE) 10 MG tablet Take 10 mg by mouth every 6 (six) hours as needed.      . sodium chloride 0.9 % SOLN 500 mL with cyclophosphamide 2 G SOLR Inject into the vein 2 (two) times a week.        OBJECTIVE: There were no vitals filed for this visit.   There is no height or weight on file to  calculate BMI.    ECOG FS: 0  Physical Exam: HEENT:  Sclerae anicteric, conjunctivae pink.  Oropharynx clear.  No mucositis or candidiasis.  Mild pharyngeal erythema, no exudate.  Nodes:  No cervical, supraclavicular, or axillary lymphadenopathy palpated.  Breast Exam: Deferred Lungs:  Clear to auscultation bilaterally.  No crackles, rhonchi, or wheezes.   Heart:  Regular rate and rhythm.   Abdomen:  Soft, obese, nontender.  Positive bowel sounds.  No organomegaly or masses palpated.   Musculoskeletal:  No focal spinal tenderness to palpation.  Extremities:  Benign.  No peripheral edema or cyanosis.   Skin:  Benign.   Neuro:  Nonfocal. Alert and oriented x3.   LAB RESULTS: Lab Results  Component Value Date   WBC 6.4 10/07/2011   NEUTROABS 4.8 10/07/2011   HGB 9.4* 10/07/2011   HCT 28.4* 10/07/2011   MCV 90.7 10/07/2011   PLT 196  10/07/2011      Chemistry      Component Value Date/Time   NA 137 09/23/2011 0947   K 3.4* 09/23/2011 0947   CL 96 09/23/2011 0947   CO2 30 09/23/2011 0947   BUN 11 09/23/2011 0947   CREATININE 0.61 09/23/2011 0947      Component Value Date/Time   CALCIUM 9.2 09/23/2011 0947   ALKPHOS 80 09/23/2011 0947   AST 31 09/23/2011 0947   ALT 26 09/23/2011 0947   BILITOT 0.3 09/23/2011 0947       Lab Results  Component Value Date   LABCA2 87* 08/06/2011    STUDIES: No new studies found.  ASSESSMENT: [name]   (1)  status post bilateral breast biopsies 07/24/2011, showing, on the right, a clinical T2 N0 papillary/ductal breast cancer, estrogen and progesterone receptor positive, HER-2 negative, with an MIB-1 of 10%; on the left, a T3 N1 invasive ductal carcinoma, grade 3, triple positive, with an MIB-1 of 60%.  (2)  now being treated in the neoadjuvant setting, the goal being to complete 4 dose dense cycles of doxorubicin/cyclophosphamide, followed by 4 dose dense cycles of paclitaxel and trastuzumab, to trastuzumab then continued for a total of one  year.  (3)  History of afebrile chemo-induced neutropenia  (3)  the patient will need postmastectomy radiation at least on the left.  PLAN: Linda [name] will proceed to treatment today as scheduled for her fourth and final dose of doxorubicin/cyclophosphamide. She will return tomorrow for her Neulasta injection. She is already scheduled for repeat breast MRI later this week on April 26, and we'll see Dr. Darnelle Catalan next week to review those results. We will also schedule her to meet with Dr. Rayburn Ma soon thereafter, to review those results at the "halfway point" of her neoadjuvant chemotherapy.   Jakelin Taussig    10/07/2011

## 2011-10-07 NOTE — Patient Instructions (Signed)
Pt refused AVS. Aware of visit tomorrow for injection

## 2011-10-07 NOTE — Progress Notes (Signed)
Patient ID: Linda [name], female   DOB: 02-26-1946, 66 y.o.   MRN: [id number]  Oncologist: Dr Darnelle Catalan Referring Physician: Dr Darnelle Catalan  Reason for referral: Herceptin HPI: Linda [name] is a [name] status post bilateral breast biopsies 07/24/2011, showing, on the right, a clinical T2 N0 papillary/ductal breast cancer, estrogen and progesterone receptor positive, HER-2 negative, with an MIB-1 of 10%; on the left, a T3 N1 invasive ductal carcinoma, grade 3, triple positive, with an MIB-1 of 60%. She will begin  cyclophosphamide and doxorubicin  in dose dense fashion x4, to be followed by paclitaxel and trastuzumab again every 2 weeks x4, with the trastuzumab continue to complete a year. When she completes the 8 weeks of chemotherapy as she will be ready for surgery.   08/20/11 ECHO EF 60-65% Lateral S' 8.5 LVH   4/9 Potassium 3.4 Creatinine 0.61  Last visit started on lisinopril/HCTZ but later stopped due to cough and swithced to Hyzaar. She reports decreased cough with Hyzaar.  She continues on antibiotics for URI. Denies SOB/PND/Orthpnea. Complaint with medications. Chronic lower extremity edema.   Review of Systems:     Cardiac Review of Systems: {Y] = yes [ ]  = no  Chest Pain [    ]  Resting SOB [   ] Exertional SOB  [  ]  Orthopnea [  ]   Pedal Edema [   ]    Palpitations [  ] Syncope  [  ]   Presyncope [   ]  General Review of Systems: [Y] = yes [  ]=no Constitional: recent weight change [Y  ]; anorexia [  ]; fatigue [  ]; nausea [  ]; night sweats [  ]; fever [  ]; or chills [  ];                                                                                                                                          Dental: poor dentition[  ]; Last Dentist visit:   Eye : blurred vision [  ]; diplopia [   ]; vision changes [  ];  Amaurosis fugax[  ]; Resp: cough [  ];  wheezing[  ];  hemoptysis[  ]; shortness of breath[  ]; paroxysmal nocturnal dyspnea[  ]; dyspnea on  exertion[  ]; or orthopnea[  ];  GI:  gallstones[  ], vomiting[  ];  dysphagia[  ]; melena[  ];  hematochezia [  ]; heartburn[  ];    GU: kidney stones [  ]; hematuria[  ];   dysuria [  ];  nocturia[  ];  history of     obstruction [  ];                 Skin: rash, swelling[  ];, hair loss[  ];  peripheral edema[  ];  or itching[  ]; Musculosketetal: myalgias[  ];  joint swelling[  ];  joint erythema[  ];  joint pain[  ];  back pain[  ];  Heme/Lymph: bruising[  ];  bleeding[  ];  anemia[  ];  Neuro: TIA[  ];  headaches[  ];  stroke[  ];  vertigo[  ];  seizures[  ];   paresthesias[  ];  difficulty walking[  ];  Psych:depression[  ]; anxiety[  ];  Endocrine: diabetes[  ];  thyroid dysfunction[  ];   Other:    Past Medical History  Diagnosis Date  . Cancer 08-13-11    07-31-11-Dx. Bilarteral Breast cancer-left greater than rt.  . Hematuria, undiagnosed cause 08-13-11    Being evaluated by [facility] 08-14-11    Current Outpatient Prescriptions  Medication Sig Dispense Refill  . Alum & Mag Hydroxide-Simeth (MAGIC MOUTHWASH W/LIDOCAINE) SOLN Take 5 mLs by mouth 4 (four) times daily as needed.  300 mL  2  . ciprofloxacin (CIPRO) 500 MG tablet Take 1 tablet (500 mg total) by mouth 2 (two) times daily.  14 tablet  3  . DOXOrubicin (ADRIAMYCIN) 10 MG SOLR Inject 148 mg into the vein every 14 (fourteen) days.      [name] losartan-hydrochlorothiazide (HYZAAR) 50-12.5 MG per tablet Take 1 tablet by mouth daily.  30 tablet  6  . Multiple Vitamin (MULTIVITAMIN) capsule Take 1 capsule by mouth daily. States only daily-will stop today.      . ondansetron (ZOFRAN) 8 MG tablet Take 8 mg by mouth every 12 (twelve) hours as needed.      . prochlorperazine (COMPAZINE) 10 MG tablet Take 10 mg by mouth every 6 (six) hours as needed.      . sodium chloride 0.9 % SOLN 500 mL with cyclophosphamide 2 G SOLR Inject into the vein 2 (two) times a week.      . carvedilol (COREG) 3.125 MG tablet Take 1 tablet (3.125 mg  total) by mouth 2 (two) times daily with a meal.  60 tablet  3  . potassium chloride SA (K-DUR,KLOR-CON) 20 MEQ tablet Take 1 tablet (20 mEq total) by mouth daily.  30 tablet  6     Allergies  Allergen Reactions  . Ace Inhibitors Cough    History   Social History  . Marital Status: Single    Spouse Name: N/A    Number of Children: N/A  . Years of Education: N/A   Occupational History  . Not on file.   Social History Main Topics  . Smoking status: Never Smoker   . Smokeless tobacco: Not on file  . Alcohol Use: Yes     rare- occ.  . Drug Use: No  . Sexually Active: No   Other Topics Concern  . Not on file   Social History Narrative  . No narrative on file    Family History  Problem Relation Age of Onset  . Heart disease Mother   . Heart attack Father     PHYSICAL EXAM: Filed Vitals:   10/06/11 0947  BP: 114/66  Pulse: 100  Weight: 266 lb 8 oz (120.884 kg)  SpO2: 98%  266 (272) General:  Obese. Well appearing. No respiratory difficulty HEENT: normal Neck: supple. no obvious JVD. Carotids 2+ bilat; no bruits. No lymphadenopathy or thryomegaly appreciated. Cor: PMI nondisplaced. Regular rate & rhythm. No rubs, gallops or murmurs. Lungs: clear Abdomen: soft, obese nontender, nondistended.No bruits or masses. Good bowel sounds. Extremities: no cyanosis, clubbing, rash, edema Neuro: alert & oriented x 3, cranial nerves grossly  intact. moves all 4 extremities w/o difficulty. Affect pleasant.  ASSESSMENT & PLAN:

## 2011-10-07 NOTE — Telephone Encounter (Signed)
gve the pt her April-may 2013 appt calendar along with the appt to see dr blackmon at [facility]

## 2011-10-08 ENCOUNTER — Ambulatory Visit (HOSPITAL_BASED_OUTPATIENT_CLINIC_OR_DEPARTMENT_OTHER): Payer: Medicare Other

## 2011-10-08 VITALS — BP 126/63 | HR 83 | Temp 97.7°F

## 2011-10-08 DIAGNOSIS — C50119 Malignant neoplasm of central portion of unspecified female breast: Secondary | ICD-10-CM

## 2011-10-08 MED ORDER — PEGFILGRASTIM INJECTION 6 MG/0.6ML
6.0000 mg | Freq: Once | SUBCUTANEOUS | Status: AC
  Administered 2011-10-08: 6 mg via SUBCUTANEOUS
  Filled 2011-10-08: qty 0.6

## 2011-10-09 ENCOUNTER — Other Ambulatory Visit (HOSPITAL_COMMUNITY): Payer: Self-pay | Admitting: Radiology

## 2011-10-09 ENCOUNTER — Ambulatory Visit (HOSPITAL_COMMUNITY)
Admission: RE | Admit: 2011-10-09 | Discharge: 2011-10-09 | Disposition: A | Discharge status: Home / Self Care | Payer: Medicare Other | Source: Ambulatory Visit | Attending: Adult Health | Admitting: Adult Health

## 2011-10-09 DIAGNOSIS — C50119 Malignant neoplasm of central portion of unspecified female breast: Secondary | ICD-10-CM

## 2011-10-09 DIAGNOSIS — R0602 Shortness of breath: Secondary | ICD-10-CM | POA: Insufficient documentation

## 2011-10-09 DIAGNOSIS — C50919 Malignant neoplasm of unspecified site of unspecified female breast: Secondary | ICD-10-CM | POA: Insufficient documentation

## 2011-10-09 DIAGNOSIS — I10 Essential (primary) hypertension: Secondary | ICD-10-CM | POA: Insufficient documentation

## 2011-10-09 LAB — PULMONARY FUNCTION TEST

## 2011-10-09 MED ORDER — ALBUTEROL SULFATE (5 MG/ML) 0.5% IN NEBU
2.5000 mg | INHALATION_SOLUTION | Freq: Once | RESPIRATORY_TRACT | Status: AC
  Administered 2011-10-09: 2.5 mg via RESPIRATORY_TRACT

## 2011-10-10 ENCOUNTER — Ambulatory Visit (HOSPITAL_COMMUNITY)
Admission: RE | Admit: 2011-10-10 | Discharge: 2011-10-10 | Disposition: A | Discharge status: Home / Self Care | Payer: Medicare Other | Source: Ambulatory Visit | Attending: Physician Assistant | Admitting: Physician Assistant

## 2011-10-10 DIAGNOSIS — C50919 Malignant neoplasm of unspecified site of unspecified female breast: Secondary | ICD-10-CM | POA: Insufficient documentation

## 2011-10-10 DIAGNOSIS — C50119 Malignant neoplasm of central portion of unspecified female breast: Secondary | ICD-10-CM

## 2011-10-10 DIAGNOSIS — R599 Enlarged lymph nodes, unspecified: Secondary | ICD-10-CM | POA: Insufficient documentation

## 2011-10-10 MED ORDER — GADOBENATE DIMEGLUMINE 529 MG/ML IV SOLN
20.0000 mL | Freq: Once | INTRAVENOUS | Status: AC | PRN
  Administered 2011-10-10: 20 mL via INTRAVENOUS

## 2011-10-14 ENCOUNTER — Ambulatory Visit (HOSPITAL_BASED_OUTPATIENT_CLINIC_OR_DEPARTMENT_OTHER): Payer: Medicare Other | Admitting: Oncology

## 2011-10-14 ENCOUNTER — Other Ambulatory Visit (HOSPITAL_COMMUNITY): Payer: Self-pay | Admitting: Adult Health

## 2011-10-14 ENCOUNTER — Other Ambulatory Visit: Payer: Self-pay | Admitting: Oncology

## 2011-10-14 ENCOUNTER — Inpatient Hospital Stay (HOSPITAL_COMMUNITY): Payer: Medicare Other

## 2011-10-14 ENCOUNTER — Other Ambulatory Visit (HOSPITAL_BASED_OUTPATIENT_CLINIC_OR_DEPARTMENT_OTHER): Payer: Medicare Other | Admitting: Lab

## 2011-10-14 ENCOUNTER — Encounter (HOSPITAL_COMMUNITY): Payer: Self-pay | Admitting: *Deleted

## 2011-10-14 ENCOUNTER — Inpatient Hospital Stay (HOSPITAL_COMMUNITY)
Admission: AD | Admit: 2011-10-14 | Discharge: 2011-10-17 | DRG: 809 | Disposition: A | Discharge status: Home / Self Care | Payer: Medicare Other | Source: Ambulatory Visit | Attending: Oncology | Admitting: Oncology

## 2011-10-14 ENCOUNTER — Other Ambulatory Visit (HOSPITAL_BASED_OUTPATIENT_CLINIC_OR_DEPARTMENT_OTHER): Payer: Medicare Other | Admitting: *Deleted

## 2011-10-14 ENCOUNTER — Encounter: Payer: Self-pay | Admitting: Oncology

## 2011-10-14 VITALS — BP 135/73 | HR 112 | Temp 100.7°F | Ht 67.0 in | Wt 264.4 lb

## 2011-10-14 DIAGNOSIS — D709 Neutropenia, unspecified: Secondary | ICD-10-CM

## 2011-10-14 DIAGNOSIS — D702 Other drug-induced agranulocytosis: Principal | ICD-10-CM | POA: Diagnosis present

## 2011-10-14 DIAGNOSIS — C50119 Malignant neoplasm of central portion of unspecified female breast: Secondary | ICD-10-CM | POA: Diagnosis present

## 2011-10-14 DIAGNOSIS — R05 Cough: Secondary | ICD-10-CM

## 2011-10-14 DIAGNOSIS — R059 Cough, unspecified: Secondary | ICD-10-CM | POA: Diagnosis present

## 2011-10-14 DIAGNOSIS — E669 Obesity, unspecified: Secondary | ICD-10-CM | POA: Diagnosis present

## 2011-10-14 DIAGNOSIS — C50519 Malignant neoplasm of lower-outer quadrant of unspecified female breast: Secondary | ICD-10-CM

## 2011-10-14 DIAGNOSIS — T451X5A Adverse effect of antineoplastic and immunosuppressive drugs, initial encounter: Secondary | ICD-10-CM | POA: Diagnosis present

## 2011-10-14 DIAGNOSIS — Z6841 Body Mass Index (BMI) 40.0 and over, adult: Secondary | ICD-10-CM

## 2011-10-14 DIAGNOSIS — K5900 Constipation, unspecified: Secondary | ICD-10-CM | POA: Diagnosis present

## 2011-10-14 DIAGNOSIS — I10 Essential (primary) hypertension: Secondary | ICD-10-CM

## 2011-10-14 DIAGNOSIS — K219 Gastro-esophageal reflux disease without esophagitis: Secondary | ICD-10-CM | POA: Diagnosis present

## 2011-10-14 DIAGNOSIS — C50019 Malignant neoplasm of nipple and areola, unspecified female breast: Secondary | ICD-10-CM

## 2011-10-14 DIAGNOSIS — D696 Thrombocytopenia, unspecified: Secondary | ICD-10-CM

## 2011-10-14 DIAGNOSIS — D649 Anemia, unspecified: Secondary | ICD-10-CM | POA: Diagnosis present

## 2011-10-14 DIAGNOSIS — Z170 Estrogen receptor positive status [ER+]: Secondary | ICD-10-CM

## 2011-10-14 DIAGNOSIS — Z79899 Other long term (current) drug therapy: Secondary | ICD-10-CM

## 2011-10-14 DIAGNOSIS — R5081 Fever presenting with conditions classified elsewhere: Secondary | ICD-10-CM | POA: Diagnosis present

## 2011-10-14 LAB — COMPREHENSIVE METABOLIC PANEL
Albumin: 3.1 g/dL — ABNORMAL LOW (ref 3.5–5.2)
BUN: 10 mg/dL (ref 6–23)
CO2: 30 mEq/L (ref 19–32)
Calcium: 8.9 mg/dL (ref 8.4–10.5)
Chloride: 97 mEq/L (ref 96–112)
Creatinine, Ser: 0.52 mg/dL (ref 0.50–1.10)
Glucose, Bld: 149 mg/dL — ABNORMAL HIGH (ref 70–99)
Potassium: 3.6 mEq/L (ref 3.5–5.3)

## 2011-10-14 LAB — CBC WITH DIFFERENTIAL/PLATELET
BASO%: 0 % (ref 0.0–2.0)
Basophils Absolute: 0 10*3/uL (ref 0.0–0.1)
Eosinophils Absolute: 0 10*3/uL (ref 0.0–0.5)
HCT: 23.7 % — ABNORMAL LOW (ref 34.8–46.6)
HGB: 7.8 g/dL — ABNORMAL LOW (ref 11.6–15.9)
LYMPH%: 69.6 % — ABNORMAL HIGH (ref 14.0–49.7)
MCHC: 33 g/dL (ref 31.5–36.0)
MONO#: 0 10*3/uL — ABNORMAL LOW (ref 0.1–0.9)
NEUT%: 7.4 % — ABNORMAL LOW (ref 38.4–76.8)
Platelets: 131 10*3/uL — ABNORMAL LOW (ref 145–400)
WBC: 0.2 10*3/uL — CL (ref 3.9–10.3)

## 2011-10-14 MED ORDER — ENOXAPARIN SODIUM 40 MG/0.4ML ~~LOC~~ SOLN
40.0000 mg | SUBCUTANEOUS | Status: DC
  Administered 2011-10-14 – 2011-10-17 (×4): 40 mg via SUBCUTANEOUS
  Filled 2011-10-14 (×4): qty 0.4

## 2011-10-14 MED ORDER — ACETAMINOPHEN 650 MG RE SUPP: 650.0000 mg | Freq: Four times a day (QID) | RECTAL | Status: DC | PRN

## 2011-10-14 MED ORDER — SODIUM CHLORIDE 0.9 % IV SOLN
500.0000 mg | Freq: Three times a day (TID) | INTRAVENOUS | Status: DC
  Administered 2011-10-14 – 2011-10-15 (×2): 500 mg via INTRAVENOUS
  Filled 2011-10-14 (×4): qty 500

## 2011-10-14 MED ORDER — CARVEDILOL 3.125 MG PO TABS
3.1250 mg | ORAL_TABLET | Freq: Two times a day (BID) | ORAL | Status: DC
  Administered 2011-10-14 – 2011-10-17 (×7): 3.125 mg via ORAL
  Filled 2011-10-14 (×9): qty 1

## 2011-10-14 MED ORDER — POTASSIUM CHLORIDE IN NACL 20-0.9 MEQ/L-% IV SOLN
INTRAVENOUS | Status: DC
  Administered 2011-10-14 – 2011-10-17 (×4): via INTRAVENOUS
  Filled 2011-10-14 (×11): qty 1000

## 2011-10-14 MED ORDER — TRAZODONE 25 MG HALF TABLET
25.0000 mg | ORAL_TABLET | Freq: Every evening | ORAL | Status: DC | PRN
  Filled 2011-10-14: qty 1

## 2011-10-14 MED ORDER — ONDANSETRON HCL 4 MG/2ML IJ SOLN: 4.0000 mg | Freq: Four times a day (QID) | INTRAMUSCULAR | Status: DC | PRN

## 2011-10-14 MED ORDER — ONDANSETRON HCL 4 MG PO TABS: 4.0000 mg | ORAL_TABLET | Freq: Four times a day (QID) | ORAL | Status: DC | PRN

## 2011-10-14 MED ORDER — ALUM & MAG HYDROXIDE-SIMETH 200-200-20 MG/5ML PO SUSP: 30.0000 mL | Freq: Four times a day (QID) | ORAL | Status: DC | PRN

## 2011-10-14 MED ORDER — PROMETHAZINE-CODEINE 6.25-10 MG/5ML PO SYRP
5.0000 mL | ORAL_SOLUTION | Freq: Three times a day (TID) | ORAL | Status: DC | PRN
Start: 2011-10-14 — End: 2011-10-18
  Administered 2011-10-14 – 2011-10-16 (×4): 5 mL via ORAL
  Filled 2011-10-14 (×4): qty 5

## 2011-10-14 MED ORDER — ACETAMINOPHEN 325 MG PO TABS
650.0000 mg | ORAL_TABLET | Freq: Four times a day (QID) | ORAL | Status: DC | PRN
  Administered 2011-10-14: 650 mg via ORAL
  Filled 2011-10-14: qty 2

## 2011-10-14 MED ORDER — SODIUM CHLORIDE 0.9 % IV SOLN
500.0000 mg | Freq: Once | INTRAVENOUS | Status: AC
  Administered 2011-10-14: 500 mg via INTRAVENOUS
  Filled 2011-10-14: qty 500

## 2011-10-14 NOTE — Progress Notes (Signed)
Per MD request post visit with noted neutropenia and pt with elevated temp and chills- port accessed and blood cultures x 2 obtained. Primaxin hung at 1440.  Bed control contacted for admission and managed care notified as well.  Iv line capped at 1530. Dressing intact with biosphere. Transported pt via wheelchair to room 1310 post giving telephone report to attending Rn.

## 2011-10-14 NOTE — H&P (Signed)
ID: Linda [name] DOB: 1945-10-31 MR#: [id number] NWG#:[id number]   HISTORY OF PRESENT ILLNESS:  The patient noted a small amount of drainage from her left breast December of 2012. She brought it to her gynecologist's attention in January of 2013 and was set up for bilateral diagnostic mammography at the breast Center July 24, 2011. This was the patient's first ever mammogram. It showed a large irregular mass in the left retroareolar region extending to the nipple, with nipple retraction and skin thickening. This measured approximately 8.4 cm. It was associated with pleomorphic microcalcifications. By exam there was moderate distortion and retraction of the nipple with a large palpable ill-defined area in the retroareolar region. In the right right breast there was also a 2 cm hard mass palpated. Ultrasound of the right breast mass showed a complex cystic/solid area measuring 1.9 cm. Ultrasound of the right axilla was negative. Ultrasound of the left breast showed a large hypoechoic mass measuring at least 3.8 cm. The left axilla showed several adjacent abnormal appearing lymph nodes.  With this information biopsy of the right and left breast masses were obtained the same day, and showed ([id number])  (a) on the right, and invasive ductal carcinoma with papillary features, estrogen and progesterone receptor positive, HER-2 negative, with an MIB-1 of 10%.  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 The MIB-1 was 60% for the left-sided tumor.  With this information the patient was presented at the multidisciplinary breast cancer conference 08/06/2011. Subsequent evaluation and treatments are as detailed below.  INTERVAL HISTORY:  The patient was seen today in clinic and found to be febrile, with a temperature of 100.7. CBC was obtained which shows severe neutropenia. Accordingly the patient is being  admitted for further evaluation and treatment.  REVIEW OF SYSTEMS:  Linda [name] was not aware that she was having a fever. However she has been having chills for the past 24 hours. She keeps a dry cough, and is in the process of being evaluated for asthma. There has been no sputum production, no pleurisy, no hemoptysis, no shortness of breath. She has a little nausea from the cough, not from the chemotherapy. There has been no vomiting. There've been no unusual headaches, visual changes, or change in bowel or bladder habits. A detailed review of systems today was otherwise noncontributory  PAST MEDICAL HISTORY:  Past Medical History   Diagnosis  Date   .  Cancer  08-13-11     07-31-11-Dx. Bilarteral Breast cancer-left greater than rt.   .  Hematuria, undiagnosed cause  08-13-11     Being evaluated by [facility] 08-14-11    PAST SURGICAL HISTORY:  Past Surgical History   Procedure  Date   .  Child birth  08-13-11     x3 -NVD   .  Portacath placement  08/15/2011     Procedure: INSERTION PORT-A-CATH; Surgeon: Shelly Rubenstein, MD; Location: [facility]; Service: General; Laterality: N/A;    FAMILY HISTORY  Family History   Problem  Relation  Age of Onset   .  Heart disease  Mother    .  Heart attack  Father     GYNECOLOGIC HISTORY:  She does not recall when she had menarche. She had her first child at age 3. She is GX P3. She underwent menopause in her mid 57s. She never took hormone replacement.   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 She moved to [place] about 2 years ago but has a Museum/gallery curator in [place]. Son Thayer Ohm lives in [place] and works as a IT sales professional. His wife is a Engineer, civil (consulting). Son Onalee Hua lives at [place] [employer] and is a Art therapist in addition to having the [employer]. The patient attends a local [employer] here  ADVANCED DIRECTIVES: Not in place   HEALTH MAINTENANCE:  History   Substance Use Topics   .  Smoking status:  Never Smoker   .  Smokeless tobacco:  Not on file   .  Alcohol Use:  Yes      rare- occ.    Colonoscopy:  PAP:  Bone density:  Lipid panel:  Allergies   Allergen  Reactions   .  Ace Inhibitors  Cough    Current Outpatient Prescriptions   Medication  Sig  Dispense  Refill   .  Alum & Mag Hydroxide-Simeth (MAGIC MOUTHWASH W/LIDOCAINE) SOLN  Take 5 mLs by mouth 4 (four) times daily as needed.  300 mL  2   .  carvedilol (COREG) 3.125 MG tablet  Take 1 tablet (3.125 mg total) by mouth 2 (two) times daily with a meal.  60 tablet  3   .  ciprofloxacin (CIPRO) 500 MG tablet  Take 1 tablet (500 mg total) by mouth 2 (two) times daily.  14 tablet  3   .  DOXOrubicin (ADRIAMYCIN) 10 MG SOLR  Inject 148 mg into the vein every 14 (fourteen) days.     [name]  losartan-hydrochlorothiazide (HYZAAR) 50-12.5 MG per tablet  Take 1 tablet by mouth daily.  30 tablet  6   .  Multiple Vitamin (MULTIVITAMIN) capsule  Take 1 capsule by mouth daily. States only daily-will stop today.     .  ondansetron (ZOFRAN) 8 MG tablet  Take 8 mg by mouth every 12 (twelve) hours as needed.     .  potassium chloride SA (K-DUR,KLOR-CON) 20 MEQ tablet  Take 1 tablet (20 mEq total) by mouth daily.  30 tablet  6   .  prochlorperazine (COMPAZINE) 10 MG tablet  Take 10 mg by mouth every 6 (six) hours as needed.     .  sodium chloride 0.9 % SOLN 500 mL with cyclophosphamide 2 G SOLR  Inject into the vein 2 (two) times a week.      OBJECTIVE:  There were no vitals filed for this visit. There is no height or weight on file to calculate BMI. ECOG FS: 0  Physical Exam:  HEENT: Sclerae anicteric, conjunctivae pink. Oropharynx clear. No mucositis or candidiasis. Mild pharyngeal erythema, no exudate.  Nodes: No cervical, supraclavicular, or axillary lymphadenopathy palpated.  Breast Exam: Deferred  Lungs: Clear to  auscultation bilaterally. No crackles, rhonchi, or wheezes.  Heart: Regular rate and rhythm.  Abdomen: Soft, obese, nontender. Positive bowel sounds. No organomegaly or masses palpated.  Musculoskeletal: No focal spinal tenderness to palpation.  Extremities: Benign. No peripheral edema or cyanosis.  Skin: Benign.  Neuro: Nonfocal. Alert and oriented x3.  LAB RESULTS:  Lab Results   Component  Value  Date    WBC  6.4  10/07/2011    NEUTROABS  4.8  10/07/2011    HGB  9.4*  10/07/2011    HCT  28.4*  10/07/2011  MCV  90.7  10/07/2011    PLT  196  10/07/2011    Chemistry       Component  Value  Date/Time  Component  Value  Date/Time    NA  137  09/23/2011 0947   CALCIUM  9.2  09/23/2011 0947    K  3.4*  09/23/2011 0947   ALKPHOS  80  09/23/2011 0947    CL  96  09/23/2011 0947   AST  31  09/23/2011 0947    CO2  30  09/23/2011 0947   ALT  26  09/23/2011 0947    BUN  11  09/23/2011 0947   BILITOT  0.3  09/23/2011 0947    CREATININE  0.61  09/23/2011 0947        Lab Results   Component  Value  Date    LABCA2  87*  08/06/2011    STUDIES:  No new studies found.  ASSESSMENT:  [name]  (1) status post bilateral breast biopsies 07/24/2011, showing, on the right, a clinical T2 N0 papillary/ductal breast cancer, estrogen and progesterone receptor positive, HER-2 negative, with an MIB-1 of 10%; on the left, a T3 N1 invasive ductal carcinoma, grade 3, triple positive, with an MIB-1 of 60%.  (2) now being treated in the neoadjuvant setting, the goal being to complete 4 dose dense cycles of doxorubicin/cyclophosphamide, followed by 4 dose dense cycles of paclitaxel and trastuzumab, to trastuzumab then continued for a total of one year.  (3) History of afebrile chemo-induced neutropenia  (3) the patient will need postmastectomy radiation at least on the left.  PLAN:  Linda [name] will proceed to treatment today as scheduled for her fourth and final dose of doxorubicin/cyclophosphamide. She will return tomorrow  for her Neulasta injection. She is already scheduled for repeat breast MRI later this week on April 26, and we'll see Dr. Darnelle Catalan next week to review those results. We will also schedule her to meet with Dr. Rayburn Ma soon thereafter, to review those results at the "halfway point" of her neoadjuvant chemotherapy.  BERRY,AMY 10/07/2011

## 2011-10-14 NOTE — Progress Notes (Signed)
ID: Linda [name]   DOB: 04-12-1946  MR#: [id number]  WUJ#:[id number]  HISTORY OF PRESENT ILLNESS: The patient noted a small amount of drainage from her left breast December of 2012. She brought it to her gynecologist's attention in January of 2013 and was set up for bilateral diagnostic mammography at the breast Center July 24, 2011. This was the patient's first ever mammogram. It showed a large irregular mass in the left retroareolar region extending to the nipple, with nipple retraction and skin thickening. This measured approximately 8.4 cm. It was associated with pleomorphic microcalcifications. By exam there was moderate distortion and retraction of the nipple with a large palpable ill-defined area in the retroareolar region. In the right right breast there was also a 2 cm hard mass palpated. Ultrasound of the right breast mass showed a complex cystic/solid area measuring 1.9 cm. Ultrasound of the right axilla was negative. Ultrasound of the left breast showed a large hypoechoic mass measuring at least 3.8 cm. The left axilla showed several adjacent abnormal appearing lymph nodes.  With this information biopsy of the right and left breast masses were obtained the same day, and showed ([id number])  (a) on the right, and invasive ductal carcinoma with papillary features, estrogen and progesterone receptor positive, HER-2 negative, with an MIB-1 of 10%.  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 The MIB-1 was 60% for the left-sided tumor.  With this information the patient was presented at the multidisciplinary breast cancer conference 08/06/2011. Subsequent evaluation and treatments are as detailed below.  INTERVAL HISTORY: Linda [name] returns today for day 1 cycle 4 of 4 planned dose dense cycles of doxorubicin/cyclophosphamide being given in the neoadjuvant setting for bilateral breast carcinomas.  She receives Neulasta on day 2 for granulocyte support. Despite Neulasta, Linda [name] has a history of afebrile neutropenia, and has recently completed a course of prophylactic Cipro following cycle 3.  Interval history is remarkable for Linda [name] having been seen by Dr. Gala Romney last week. His office has referred her for PFT later this week to followup on her shortness of breath.  REVIEW OF SYSTEMS: Fatigue is Linda [name]'s biggest complaint today. She tells me she is having a difficult time doing anything other than walking between the bed room and the kitchen. She does have some shortness of breath with exertion. She continues to have a slight cough, less productive, and her laryngitis is improving. She's had no fevers, chills, or night sweats. No signs of abnormal bleeding.  Linda [name] also denies any nausea or change in bowel habits. No abnormal headaches, dizziness, change in vision. A detailed review of systems is otherwise noncontributory.  PAST MEDICAL HISTORY: Past Medical History  Diagnosis Date  . Cancer 08-13-11    07-31-11-Dx. Bilarteral Breast cancer-left greater than rt.  . Hematuria, undiagnosed cause 08-13-11    Being evaluated by [facility] 08-14-11    PAST SURGICAL HISTORY: Past Surgical History  Procedure Date  . Child birth 08-13-11    x3 -NVD  . Portacath placement 08/15/2011    Procedure: INSERTION PORT-A-CATH;  Surgeon: Shelly Rubenstein, MD;  Location: [facility];  Service: General;  Laterality: N/A;    FAMILY HISTORY Family History  Problem Relation Age of Onset  . Heart disease Mother   . Heart attack Father     GYNECOLOGIC HISTORY:  She does not recall when she had menarche. She had her first child at age 66. She is  GX P3. She underwent menopause in her mid 71s. She never took hormone replacement.   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 She moved to [place] about 2 years ago but has a Museum/gallery curator in [place]. Son Thayer Ohm lives in [place] and works as a IT sales professional. His wife is a Engineer, civil (consulting). Son Onalee Hua lives at [employer] and is a Art therapist in addition to having the [employer]. The patient attends a local [employer] here   ADVANCED DIRECTIVES: Not in place  HEALTH MAINTENANCE: History  Substance Use Topics  . Smoking status: Never Smoker   . Smokeless tobacco: Not on file  . Alcohol Use: Yes     rare- occ.     Colonoscopy:  PAP:  Bone density:  Lipid panel:  Allergies  Allergen Reactions  . Ace Inhibitors Cough    Current Outpatient Prescriptions  Medication Sig Dispense Refill  . Alum & Mag Hydroxide-Simeth (MAGIC MOUTHWASH W/LIDOCAINE) SOLN Take 5 mLs by mouth 4 (four) times daily as needed.  300 mL  2  . carvedilol (COREG) 3.125 MG tablet Take 1 tablet (3.125 mg total) by mouth 2 (two) times daily with a meal.  60 tablet  3  . ciprofloxacin (CIPRO) 500 MG tablet Take 1 tablet (500 mg total) by mouth 2 (two) times daily.  14 tablet  3  . losartan-hydrochlorothiazide (HYZAAR) 50-12.5 MG per tablet Take 1 tablet by mouth daily.  30 tablet  6  . Multiple Vitamin (MULTIVITAMIN) capsule Take 1 capsule by mouth daily. States only daily-will stop today.      . ondansetron (ZOFRAN) 8 MG tablet Take 8 mg by mouth every 12 (twelve) hours as needed.      . potassium chloride SA (K-DUR,KLOR-CON) 20 MEQ tablet Take 1 tablet (20 mEq total) by mouth daily.  30 tablet  6  . prochlorperazine (COMPAZINE) 10 MG tablet Take 10 mg by mouth every 6 (six) hours as needed.      [name] DOXOrubicin (ADRIAMYCIN) 10 MG SOLR Inject 148 mg into the vein every 14 (fourteen) days.      . sodium chloride 0.9 % SOLN 500 mL with cyclophosphamide 2 G SOLR Inject into the vein 2 (two) times a week.        OBJECTIVE: Filed Vitals:   10/14/11 1319  BP: 135/73  Pulse: 112  Temp: 100.7 F (38.2 C)       Body mass index is 41.41 kg/(m^2).    ECOG FS: 0  Physical Exam: HEENT:  Sclerae anicteric, conjunctivae pink.  Oropharynx clear.  No mucositis or candidiasis.  Mild pharyngeal erythema, no exudate.  Nodes:  No cervical, supraclavicular, or axillary lymphadenopathy palpated.  Breast Exam: Deferred Lungs:  Clear to auscultation bilaterally.  No crackles, rhonchi, or wheezes.   Heart:  Regular rate and rhythm.   Abdomen:  Soft, obese, nontender.  Positive bowel sounds.  No organomegaly or masses palpated.   Musculoskeletal:  No focal spinal tenderness to palpation.  Extremities:  Benign.  No peripheral edema or cyanosis.   Skin:  Benign.   Neuro:  Nonfocal. Alert and oriented x3.   LAB RESULTS: Lab Results  Component Value Date   WBC 0.2* 10/14/2011   NEUTROABS 0.0* 10/14/2011   HGB 7.8* 10/14/2011   HCT 23.7* 10/14/2011  MCV 91.9 10/14/2011   PLT 131* 10/14/2011      Chemistry      Component Value Date/Time   NA 137 10/07/2011 1043   K 3.7 10/07/2011 1043   CL 99 10/07/2011 1043   CO2 30 10/07/2011 1043   BUN 8 10/07/2011 1043   CREATININE 0.55 10/07/2011 1043      Component Value Date/Time   CALCIUM 9.1 10/07/2011 1043   ALKPHOS 80 10/07/2011 1043   AST 15 10/07/2011 1043   ALT 13 10/07/2011 1043   BILITOT 0.3 10/07/2011 1043       Lab Results  Component Value Date   LABCA2 87* 08/06/2011    STUDIES: No new studies found.  ASSESSMENT: [name]   (1)  status post bilateral breast biopsies 07/24/2011, showing, on the right, a clinical T2 N0 papillary/ductal breast cancer, estrogen and progesterone receptor positive, HER-2 negative, with an MIB-1 of 10%; on the left, a T3 N1 invasive ductal carcinoma, grade 3, triple positive, with an MIB-1 of 60%.  (2)  now being treated in the neoadjuvant setting, the goal being to complete 4 dose dense cycles of doxorubicin/cyclophosphamide, followed by 4 dose dense cycles of paclitaxel and trastuzumab, to  trastuzumab then continued for a total of one year.  (3)  History of afebrile chemo-induced neutropenia  (3)  the patient will need postmastectomy radiation at least on the left.  PLAN: Linda [name] will proceed to treatment today as scheduled for her fourth and final dose of doxorubicin/cyclophosphamide. She will return tomorrow for her Neulasta injection. She is already scheduled for repeat breast MRI later this week on April 26, and we'll see Dr. Darnelle Catalan next week to review those results. We will also schedule her to meet with Dr. Rayburn Ma soon thereafter, to review those results at the "halfway point" of her neoadjuvant chemotherapy.   Lataya Varnell C    10/14/2011

## 2011-10-15 DIAGNOSIS — D696 Thrombocytopenia, unspecified: Secondary | ICD-10-CM

## 2011-10-15 DIAGNOSIS — R05 Cough: Secondary | ICD-10-CM

## 2011-10-15 DIAGNOSIS — D709 Neutropenia, unspecified: Secondary | ICD-10-CM

## 2011-10-15 DIAGNOSIS — C50119 Malignant neoplasm of central portion of unspecified female breast: Secondary | ICD-10-CM

## 2011-10-15 DIAGNOSIS — R509 Fever, unspecified: Secondary | ICD-10-CM

## 2011-10-15 LAB — CBC
HCT: 20.4 % — ABNORMAL LOW (ref 36.0–46.0)
MCHC: 33.8 g/dL (ref 30.0–36.0)
Platelets: 114 10*3/uL — ABNORMAL LOW (ref 150–400)
RDW: 14.1 % (ref 11.5–15.5)
WBC: 0.3 10*3/uL — CL (ref 4.0–10.5)

## 2011-10-15 LAB — COMPREHENSIVE METABOLIC PANEL
AST: 11 U/L (ref 0–37)
Albumin: 2.7 g/dL — ABNORMAL LOW (ref 3.5–5.2)
Alkaline Phosphatase: 51 U/L (ref 39–117)
Chloride: 102 mEq/L (ref 96–112)
Potassium: 3.5 mEq/L (ref 3.5–5.1)
Sodium: 138 mEq/L (ref 135–145)
Total Bilirubin: 0.3 mg/dL (ref 0.3–1.2)
Total Protein: 5.5 g/dL — ABNORMAL LOW (ref 6.0–8.3)

## 2011-10-15 MED ORDER — ENSURE COMPLETE PO LIQD
237.0000 mL | Freq: Two times a day (BID) | ORAL | Status: DC
  Administered 2011-10-16 – 2011-10-17 (×3): 237 mL via ORAL

## 2011-10-15 MED ORDER — VANCOMYCIN HCL 1000 MG IV SOLR
1250.0000 mg | Freq: Two times a day (BID) | INTRAVENOUS | Status: DC
  Administered 2011-10-15 – 2011-10-17 (×4): 1250 mg via INTRAVENOUS
  Filled 2011-10-15 (×7): qty 1250

## 2011-10-15 MED ORDER — VANCOMYCIN HCL 1000 MG IV SOLR
2000.0000 mg | Freq: Once | INTRAVENOUS | Status: AC
  Administered 2011-10-15: 2000 mg via INTRAVENOUS
  Filled 2011-10-15: qty 2000

## 2011-10-15 MED ORDER — SODIUM CHLORIDE 0.9 % IV SOLN
500.0000 mg | Freq: Four times a day (QID) | INTRAVENOUS | Status: DC
  Administered 2011-10-15 – 2011-10-17 (×9): 500 mg via INTRAVENOUS
  Filled 2011-10-15 (×12): qty 500

## 2011-10-15 MED ORDER — SODIUM CHLORIDE 0.9 % IV SOLN: 1250.0000 mg | Freq: Two times a day (BID) | INTRAVENOUS | Status: DC

## 2011-10-15 NOTE — Progress Notes (Signed)
INITIAL ADULT NUTRITION ASSESSMENT Date: 10/15/2011   Time: 11:44 AM Reason for Assessment: Nutrition risk   ASSESSMENT: Female 66 y.o.  Dx: Febrile neutropenia  Food/Nutrition Related Hx: Pt with breast CA s/p 4 rounds of chemotherapy. Pt reports poor appetite since February 2013. Pt reports 12 pound unintentional weight loss in the past 2 months. Pt reports that nothing tastes good and c/o chronic dry mouth and throat and cough. Pt reports she lives alone and does the cooking herself and has been trying to eat 3 meals/day and snacks. Pt reports eating 100% of breakfast this morning. Pt denies any problems chewing or swallowing.   Hx:  Past Medical History  Diagnosis Date  . Cancer 08-13-11    07-31-11-Dx. Bilarteral Breast cancer-left greater than rt.  . Hematuria, undiagnosed cause 08-13-11    Being evaluated by [facility] 08-14-11   Related Meds:  Scheduled Meds:   . carvedilol  3.125 mg Oral BID WC  . enoxaparin  40 mg Subcutaneous Q24H  . imipenem-cilastatin  500 mg Intravenous Q6H  . vancomycin  1,250 mg Intravenous Q12H  . vancomycin  2,000 mg Intravenous Once  . DISCONTD: imipenem-cilastatin  500 mg Intravenous Q8H  . DISCONTD: vancomycin  1,250 mg Intravenous Q12H   Continuous Infusions:   . 0.9 % NaCl with KCl 20 mEq / L 100 mL/hr at 10/14/11 1639   PRN Meds:.acetaminophen, acetaminophen, alum & mag hydroxide-simeth, ondansetron (ZOFRAN) IV, ondansetron, promethazine-codeine, traZODone  Ht: 5\' 8"  (172.7 cm)  Wt: 268 lb (121.564 kg)  Ideal Wt: 140 lb % Ideal Wt: 191  Usual Wt: 280 lb % Usual Wt: 95  Body mass index is 40.75 kg/(m^2).   Labs:  CMP     Component Value Date/Time   NA 138 10/15/2011 0530   K 3.5 10/15/2011 0530   CL 102 10/15/2011 0530   CO2 30 10/15/2011 0530   GLUCOSE 100* 10/15/2011 0530   BUN 8 10/15/2011 0530   CREATININE 0.49* 10/15/2011 0530   CALCIUM 8.3* 10/15/2011 0530   PROT 5.5* 10/15/2011 0530   ALBUMIN 2.7* 10/15/2011 0530   AST 11  10/15/2011 0530   ALT 12 10/15/2011 0530   ALKPHOS 51 10/15/2011 0530   BILITOT 0.3 10/15/2011 0530   GFRNONAA >90 10/15/2011 0530   GFRAA >90 10/15/2011 0530    Intake/Output Summary (Last 24 hours) at 10/15/11 1320 Last data filed at 10/15/11 4098  Gross per 24 hour  Intake    380 ml  Output      0 ml  Net    380 ml   Last BM - 10/14/11  Diet Order: General   IVF:    0.9 % NaCl with KCl 20 mEq / L Last Rate: 100 mL/hr at 10/14/11 1639    Estimated Nutritional Needs:   Kcal:1900-2250 Protein:75-95g Fluid:1.9-2.2L  NUTRITION DIAGNOSIS: -Inadequate oral intake (NI-2.1).  Status: Ongoing  RELATED TO: poor appetite, taste changes, dry mouth  AS EVIDENCE BY: pt statement, weight loss PTA  MONITORING/EVALUATION(Goals): Pt to consume >90% of meals/supplements.   EDUCATION NEEDS: -Education needs addressed - discussed different strategies to help with taste changes.   INTERVENTION: Ensure Complete BID. Recommend Biotene to help with dry mouth. Will monitor.   Dietitian #: [phone]  DOCUMENTATION CODES Per approved criteria  -Morbid Obesity    Marshall Cork 10/15/2011, 11:44 AM

## 2011-10-15 NOTE — Progress Notes (Signed)
CRITICAL VALUE ALERT  Critical value received:  WBC 0.3, Hgb 6.9 Date of notification:  10/15/11  Time of notification:  06:08  Critical value read back: yes  Nurse who received alert:  Estelle June, RN  MD notified (1st page):  Dr. Welton Flakes  Time of first page:  06:30  MD notified (2nd page):  Time of second page:  Responding MD:  Dr. Welton Flakes  Time MD responded: 6:45

## 2011-10-15 NOTE — Progress Notes (Signed)
ANTIBIOTIC CONSULT NOTE - INITIAL  Pharmacy Consult for Vancomycin/Primaxin Indication: Febrile Neutropenia  Allergies  Allergen Reactions  . Ace Inhibitors Cough    Patient Measurements: Height: 5\' 8"  (172.7 cm) Weight: 268 lb (121.564 kg) IBW/kg (Calculated) : 63.9   Vital Signs: Temp: 99.2 F (37.3 C) (05/01 0554) Temp src: Oral (05/01 0554) BP: 126/74 mmHg (05/01 0554) Pulse Rate: 88  (05/01 0554) Intake/Output from previous day: 04/30 0701 - 05/01 0700 In: 240 [P.O.:240] Out: -  Intake/Output from this shift:    Labs:  Basename 10/15/11 0530 10/14/11 1255  WBC 0.3* 0.2*  HGB 6.9* 7.8*  PLT 114* 131*  LABCREA -- --  CREATININE 0.49* 0.52   Estimated Creatinine Clearance: 96.3 ml/min (by C-G formula based on Cr of 0.49). Normalized CrCl 79 ml/min/1.64m2   Microbiology: No results found for this or any previous visit (from the past 720 hour(s)).  Medical History: Past Medical History  Diagnosis Date  . Cancer 08-13-11    07-31-11-Dx. Bilarteral Breast cancer-left greater than rt.  . Hematuria, undiagnosed cause 08-13-11    Being evaluated by [facility] 08-14-11    Medications:  Anti-infectives     Start     Dose/Rate Route Frequency Ordered Stop   10/15/11 2200   vancomycin (VANCOCIN) 1,250 mg in sodium chloride 0.9 % 250 mL IVPB        1,250 mg 166.7 mL/hr over 90 Minutes Intravenous Every 12 hours 10/15/11 0910     10/15/11 1200   imipenem-cilastatin (PRIMAXIN) 500 mg in sodium chloride 0.9 % 100 mL IVPB        500 mg 200 mL/hr over 30 Minutes Intravenous 4 times per day 10/15/11 0925     10/15/11 1000   vancomycin (VANCOCIN) 2,000 mg in sodium chloride 0.9 % 500 mL IVPB        2,000 mg 250 mL/hr over 120 Minutes Intravenous  Once 10/15/11 0910     10/15/11 0900   vancomycin (VANCOCIN) 1,250 mg in sodium chloride 0.9 % 250 mL IVPB  Status:  Discontinued        1,250 mg 166.7 mL/hr over 90 Minutes Intravenous Every 12 hours 10/15/11 0848  10/15/11 0903   10/14/11 2200   imipenem-cilastatin (PRIMAXIN) 500 mg in sodium chloride 0.9 % 100 mL IVPB  Status:  Discontinued        500 mg 200 mL/hr over 30 Minutes Intravenous 3 times per day 10/14/11 1552 10/15/11 0925         Assessment:  66 YOF w/ pancytopenia, fever s/p 4 cycles doxorubicin/cytoxan, last dose 4/23  Received Neulasta 4/24  Started on Imipenem 500mg  IV q8h yesterday by MD  Pharmacy asked to dose Vancomycin today. OK to adjust Imipenem per Dr Darnelle Catalan.   Goal of Therapy:  Vancomycin trough level 15-20 mcg/ml Appropriate dose of Imipenem  Plan:   Vancomycin 2000mg  IV x 1 loading dose followed by 1250mg  IV q12h.   Change Imipenem to 500mg  IV q6h  Vancomycin trough at steady state  Follow labs vitals and cultures  Adjust doses as necessary.   Gwen Her PharmD  [phone] 10/15/2011 9:29 AM

## 2011-10-15 NOTE — Progress Notes (Signed)
Linda [name]   DOB:06-19-1945   ZO#:[id number]   JWJ#:[id number]  Subjective: PAC slept well, generally gives me no localizing symptoms, specifically no phlegm production, no shortness of breath, no pleurisy, no hemoptysis, no rash, and no dysuria. She is minimally constipated. She continues to have a dry cough, which occasionally nauseates her. Otherwise a detailed review of systems today was noncontributory   Objective: [name] examined in bed Filed Vitals:   10/15/11 0554  BP: 126/74  Pulse: 88  Temp: 99.2 F (37.3 C)  Resp: 20    Body mass index is 40.75 kg/(m^2).  Intake/Output Summary (Last 24 hours) at 10/15/11 1223 Last data filed at 10/15/11 6213  Gross per 24 hour  Intake    380 ml  Output      0 ml  Net    380 ml     Sclerae unicteric  Oropharynx clear  No peripheral adenopathy  Lungs clear -- no rales or rhonchi, fair excursion bilaterally  Heart regular rate and rhythm  Abdomen benign  MSK no focal spinal tenderness, no peripheral edema  Neuro nonfocal    CBG (last 3)  No results found for this basename: GLUCAP:3 in the last 72 hours   Labs:  Lab Results  Component Value Date   WBC 0.3* 10/15/2011   HGB 6.9* 10/15/2011   HCT 20.4* 10/15/2011   MCV 90.3 10/15/2011   PLT 114* 10/15/2011   NEUTROABS 0.0* 10/14/2011     Lab 10/15/11 0530 10/14/11 1255  NA 138 135  K 3.5 3.6  CL 102 97  CO2 30 30  GLUCOSE 100* 149*  BUN 8 10  CREATININE 0.49* 0.52  CALCIUM 8.3* 8.9  MG -- --    Urine Studies No results found for this basename: UACOL:2,UAPR:2,USPG:2,UPH:2,UTP:2,UGL:2,UKET:2,UBIL:2,UHGB:2,UNIT:2,UROB:2,ULEU:2,UEPI:2,UWBC:2,URBC:2,UBAC:2,CAST:2,CRYS:2,UCOM:2,BILUA:2 in the last 72 hours     Studies:  X-ray Chest Pa And Lateral   10/14/2011  *RADIOLOGY REPORT*  Clinical Data: Fever, cough, history of breast carcinoma  CHEST - 2 VIEW  Comparison: Chest x-ray of 08/15/2011 and PET CT of 08/21/2011  Findings: No active infiltrate is seen.   No definite abnormality is noted at the site questioned posterior medially within the right upper lobe on PET CT.  The lungs appear clear and no effusion is noted.  Mediastinal contours appear normal.  The heart is mildly enlarged.  There are degenerative changes throughout the thoracic spine.  A right-sided Port-A-Cath is present with the tip in the mid SVC.  IMPRESSION: No active lung disease.  Right-sided Port-A-Cath tip in mid SVC.  Original Report Authenticated By: Juline Patch, M.D.    Assessment: [name]   (1) status post bilateral breast biopsies 07/24/2011, showing, on the right, a clinical T2 N0 papillary/ductal breast cancer, estrogen and progesterone receptor positive, HER-2 negative, with an MIB-1 of 10%; on the left, a T3 N1 invasive ductal carcinoma, grade 3, triple positive, with an MIB-1 of 60%.   (2) now being treated in the neoadjuvant setting, s/p 4 dose dense cycles of doxorubicin/cyclophosphamide, to be followed by 4 dose dense cycles of paclitaxel and trastuzumab, the trastuzumab then continued for a total of one year.   (3) admitted with fever and neutropenia  Plan: We are adding vancomycin today, and increasing the dose of imipenem. So far there has been no drop in her blood pressure, increase in her heart rate, or other signs of possible sepsis. I anticipate recovery of her white count in the next 48-72 hours. I strongly  encouraged her to walk at least 3 times a day outside of the room. We will continue the codeine/Phenergan syrup for cough suppression.   Linda [name] C 10/15/2011

## 2011-10-15 NOTE — Clinical Documentation Improvement (Signed)
BMI DOCUMENTATION CLARIFICATION QUERY  THIS DOCUMENT IS NOT A PERMANENT PART OF THE MEDICAL RECORD  TO RESPOND TO THE THIS QUERY, FOLLOW THE INSTRUCTIONS BELOW:  1. If needed, update documentation for the patient's encounter via the notes activity.  2. Access this query again and click edit on the In [employer].  3. After updating, or not, click F2 to complete all highlighted (required) fields concerning your review. Select "additional documentation in the medical record" OR "no additional documentation provided".  4. Click Sign note button.  5. The deficiency will fall out of your In Basket *Please let us know if you are not able to complete this workflow by phone or e-mail (listed below).         10/15/11  Dear Dr. Darnelle Catalan Marton Redwood  In an effort to better capture your patient's severity of illness, reflect appropriate length of stay and utilization of resources, a review of the patient medical record has revealed the following indicators.    Based on your clinical judgment, please clarify and document in a progress note and/or discharge summary the clinical condition associated with the following supporting information:  In responding to this query please exercise your independent judgment.  The fact that a query is asked, does not imply that any particular answer is desired or expected. Pt's BMI= >40  Please clarify whether or not BMI can be linked to one of the diagnoses listed below and document in pn and d/c. Thank You!  [facility]: When linking BMI to a diagnosis please document both BMI and diagnosis together in pn for accuracy of SOI and ROM  Possible Clinical conditions  Morbid Obesity W/ BMI= 40.8  Other condition___________________  Cannot Clinically determine _____________  Risk Factors: Bilarteral Breast cancer  Signs & Symptoms: 10/14/11  "Fatigue, shortness of breath  with exertion"  Weight: 268 lbs  Height: 25ft 8 in  BMI= 40.8   Reviewed:pt meets  criteria for morbid obesity; however I have already discharged the pt.  Thank You,  Andy Gauss RN  Clinical Documentation Specialist:  Pager [phone] E-mail [email or web address]   Health Information Management [place]

## 2011-10-16 LAB — URINE CULTURE
Colony Count: NO GROWTH
Culture  Setup Time: 201305010142
Culture: NO GROWTH

## 2011-10-16 LAB — DIFFERENTIAL
Basophils Absolute: 0 10*3/uL (ref 0.0–0.1)
Eosinophils Relative: 0 % (ref 0–5)
Lymphs Abs: 0.3 10*3/uL — ABNORMAL LOW (ref 0.7–4.0)
Monocytes Absolute: 0.2 10*3/uL (ref 0.1–1.0)
Monocytes Relative: 24 % — ABNORMAL HIGH (ref 3–12)
Neutro Abs: 0.4 10*3/uL — ABNORMAL LOW (ref 1.7–7.7)

## 2011-10-16 LAB — CBC
HCT: 22.3 % — ABNORMAL LOW (ref 36.0–46.0)
MCH: 30.2 pg (ref 26.0–34.0)
MCV: 91 fL (ref 78.0–100.0)
Platelets: 124 10*3/uL — ABNORMAL LOW (ref 150–400)
RDW: 14.4 % (ref 11.5–15.5)
WBC: 0.9 10*3/uL — CL (ref 4.0–10.5)

## 2011-10-16 MED ORDER — FLORA-Q PO CAPS
1.0000 | ORAL_CAPSULE | Freq: Every day | ORAL | Status: DC
  Administered 2011-10-16 – 2011-10-17 (×2): 1 via ORAL
  Filled 2011-10-16 (×2): qty 1

## 2011-10-16 NOTE — Progress Notes (Signed)
Linda [name]   DOB:[birth year]   ZO#:[id number]   JWJ#:[id number]  Subjective: cough is a little better; ambulating in room only; had normal BM then diarrhea x1; rest of ROS negative and no side ffects from antibiotics so far   Objective: examined sitting up in recliner Filed Vitals:   10/16/11 0621  BP: 133/85  Pulse: 101  Temp: 99 F (37.2 C)  Resp: 19    Body mass index is 40.75 kg/(m^2).  Intake/Output Summary (Last 24 hours) at 10/16/11 0721 Last data filed at 10/16/11 6213  Gross per 24 hour  Intake    640 ml  Output      2 ml  Net    638 ml     Sclerae unicteric  Oropharynx clear  No peripheral adenopathy  Lungs clear -- no rales or rhonchi  Heart regular rate and rhythm  Abdomen benign  MSK no focal spinal tenderness, no peripheral edema  Neuro nonfocal  ..  CBG (last 3)  No results found for this basename: GLUCAP:3 in the last 72 hours   Labs:  Lab Results  Component Value Date   WBC 0.9* 10/16/2011   HGB 7.4* 10/16/2011   HCT 22.3* 10/16/2011   MCV 91.0 10/16/2011   PLT 124* 10/16/2011   NEUTROABS PENDING 10/16/2011     Lab 10/15/11 0530 10/14/11 1255  NA 138 135  K 3.5 3.6  CL 102 97  CO2 30 30  GLUCOSE 100* 149*  BUN 8 10  CREATININE 0.49* 0.52  CALCIUM 8.3* 8.9  MG -- --    Urine Studies No results found for this basename: UACOL:2,UAPR:2,USPG:2,UPH:2,UTP:2,UGL:2,UKET:2,UBIL:2,UHGB:2,UNIT:2,UROB:2,ULEU:2,UEPI:2,UWBC:2,URBC:2,UBAC:2,CAST:2,CRYS:2,UCOM:2,BILUA:2 in the last 72 hours     Studies:  X-ray Chest Pa And Lateral   10/14/2011  *RADIOLOGY REPORT*  Clinical Data: Fever, cough, history of breast carcinoma  CHEST - 2 VIEW  Comparison: Chest x-ray of 08/15/2011 and PET CT of 08/21/2011  Findings: No active infiltrate is seen.  No definite abnormality is noted at the site questioned posterior medially within the right upper lobe on PET CT.  The lungs appear clear and no effusion is noted.  Mediastinal contours appear normal.  The heart is mildly  enlarged.  There are degenerative changes throughout the thoracic spine.  A right-sided Port-A-Cath is present with the tip in the mid SVC.  IMPRESSION: No active lung disease.  Right-sided Port-A-Cath tip in mid SVC.  Original Report Authenticated By: Juline Patch, M.D.    Assessment: [name]  (1) status post bilateral breast biopsies 07/24/2011, showing, on the right, a clinical T2 N0 papillary/ductal breast cancer, estrogen and progesterone receptor positive, HER-2 negative, with an MIB-1 of 10%; on the left, a T3 N1 invasive ductal carcinoma, grade 3, triple positive, with an MIB-1 of 60%.  (2) now being treated in the neoadjuvant setting, s/p 4 dose dense cycles of doxorubicin/cyclophosphamide, to be followed by 4 dose dense cycles of paclitaxel and trastuzumab, the trastuzumab then continued for a total of one year.  (3) admitted with fever and neutropenia    Plan: currently day 10 cycle 4 cytoxan/adriamycin; slow improvement in WBC; very stable otherwise with blood cultures so far negative. Encouraged ambulation in halls; as soon as neutropenia resolved will d/c to home--likely next 24-48 hours. Will add probiotics   Linda [name] C 10/16/2011

## 2011-10-17 LAB — PREPARE RBC (CROSSMATCH)

## 2011-10-17 LAB — COMPREHENSIVE METABOLIC PANEL
Albumin: 2.6 g/dL — ABNORMAL LOW (ref 3.5–5.2)
BUN: 4 mg/dL — ABNORMAL LOW (ref 6–23)
Chloride: 105 mEq/L (ref 96–112)
Creatinine, Ser: 0.51 mg/dL (ref 0.50–1.10)
Total Bilirubin: 0.2 mg/dL — ABNORMAL LOW (ref 0.3–1.2)
Total Protein: 5.3 g/dL — ABNORMAL LOW (ref 6.0–8.3)

## 2011-10-17 LAB — CBC
HCT: 20.2 % — ABNORMAL LOW (ref 36.0–46.0)
Hemoglobin: 6.7 g/dL — CL (ref 12.0–15.0)
MCH: 30.5 pg (ref 26.0–34.0)
MCHC: 33.2 g/dL (ref 30.0–36.0)
RDW: 14.8 % (ref 11.5–15.5)

## 2011-10-17 LAB — DIFFERENTIAL
Eosinophils Absolute: 0 10*3/uL (ref 0.0–0.7)
Monocytes Absolute: 0.4 10*3/uL (ref 0.1–1.0)
Neutrophils Relative %: 72 % (ref 43–77)
WBC Morphology: INCREASED

## 2011-10-17 MED ORDER — OMEPRAZOLE 40 MG PO CPDR: 40.0000 mg | DELAYED_RELEASE_CAPSULE | Freq: Every day | ORAL | Status: DC

## 2011-10-17 MED ORDER — AMOXICILLIN-POT CLAVULANATE 875-125 MG PO TABS: 1.0000 | ORAL_TABLET | Freq: Two times a day (BID) | ORAL | Status: AC

## 2011-10-17 MED ORDER — DIPHENHYDRAMINE HCL 25 MG PO CAPS
25.0000 mg | ORAL_CAPSULE | Freq: Once | ORAL | Status: AC
  Administered 2011-10-17: 25 mg via ORAL
  Filled 2011-10-17: qty 1

## 2011-10-17 MED ORDER — ACETAMINOPHEN 325 MG PO TABS
650.0000 mg | ORAL_TABLET | Freq: Once | ORAL | Status: AC
  Administered 2011-10-17: 650 mg via ORAL
  Filled 2011-10-17: qty 2

## 2011-10-17 NOTE — Progress Notes (Signed)
CRITICAL VALUE ALERT  Critical value received:  Hg 6.7  Date of notification:  10/17/11   Time of notification:  0545  Critical value read back:yes  Nurse who received alert:  Eugene Garnet, RN  MD notified (1st page):  [phone]  Time of first page:  0602  Responding MD:  Dr. Truett Perna  Time MD responded:  [email or web address].Hunt  MD notified. No order changes or modifications currently. Will continue to monitor. Eugene Garnet [employer] 10/17/2011 6:05 AM

## 2011-10-17 NOTE — Progress Notes (Signed)
Physician Discharge Summary  Patient ID: Linda [name] MRN: [id number] DOB/AGE: June 20, 1945 66 y.o.  Admit date: 10/14/2011 Discharge date: 10/17/2011  Primary Care Physician:  Sherron Monday, MD, MD   Discharge Diagnoses:    Present on Admission:  **None**  Discharge Medications:  Medication List  As of 10/17/2011  8:00 AM   ASK your doctor about these medications         carvedilol 3.125 MG tablet   Commonly known as: COREG   Take 1 tablet (3.125 mg total) by mouth 2 (two) times daily with a meal.      ciprofloxacin 500 MG tablet   Commonly known as: CIPRO   Take 1 tablet (500 mg total) by mouth 2 (two) times daily.      DOXOrubicin 10 MG Solr   Commonly known as: ADRIAMYCIN   Inject 148 mg into the vein every 14 (fourteen) days.      losartan-hydrochlorothiazide 50-12.5 MG per tablet   Commonly known as: HYZAAR   Take 1 tablet by mouth daily.      magic mouthwash w/lidocaine Soln   Take 5 mLs by mouth 4 (four) times daily as needed.      multivitamin capsule   Take 1 capsule by mouth daily. States only daily-will stop today.      ondansetron 8 MG tablet   Commonly known as: ZOFRAN   Take 8 mg by mouth every 12 (twelve) hours as needed. Nausea      potassium chloride SA 20 MEQ tablet   Commonly known as: K-DUR,KLOR-CON   Take 1 tablet (20 mEq total) by mouth daily.      prochlorperazine 10 MG tablet   Commonly known as: COMPAZINE   Take 10 mg by mouth every 6 (six) hours as needed.      sodium chloride 0.9 % SOLN 500 mL with cyclophosphamide 2 G SOLR   Inject into the vein 2 (two) times a week.             Disposition and Follow-up:   Significant Diagnostic Studies:  X-ray Chest Pa And Lateral   10/14/2011  *RADIOLOGY REPORT*  Clinical Data: Fever, cough, history of breast carcinoma  CHEST - 2 VIEW  Comparison: Chest x-ray of 08/15/2011 and PET CT of 08/21/2011  Findings: No active infiltrate is seen.  No definite abnormality is noted at the  site questioned posterior medially within the right upper lobe on PET CT.  The lungs appear clear and no effusion is noted.  Mediastinal contours appear normal.  The heart is mildly enlarged.  There are degenerative changes throughout the thoracic spine.  A right-sided Port-A-Cath is present with the tip in the mid SVC.  IMPRESSION: No active lung disease.  Right-sided Port-A-Cath tip in mid SVC.  Original Report Authenticated By: Juline Patch, M.D.   Mr Breast Bilateral W Wo Contrast  10/10/2011  *RADIOLOGY REPORT*  Clinical Data: Patient with bilateral breast cancer and left axillary metastatic lymphadenopathy undergoing neoadjuvant chemotherapy. Assess response to therapy.  BILATERAL BREAST MRI WITH AND WITHOUT CONTRAST  Technique: Multiplanar, multisequence MR images of both breasts were obtained prior to and following the intravenous administration of 20ml of Multihance.  Three dimensional images were evaluated at the independent DynaCad workstation.  Comparison:  Breast MRI 08/05/2011  Findings: Enhancement of the markedly irregular subareolar left breast mass with associated distortion and nipple retraction has significantly decreased since the breast MRI of 08/05/2011.  Some of the central aspect of the dominant subareolar  mass shows no enhancement on the current exam.  Although the margins are very ill- defined, the dominant subareolar left breast mass measures approximately 5.0 x 3.6 x 3.8 cm (previously 6.9 x 6.6 x 6.9 cm). Biopsy clip artifact is seen within the mass.  Similarly, the previously described scattered subareolar nodules involving all quadrants of the left breast have significantly decreased in enhancement and size.  Intensity of enhancement of the nodule within the medial left pectoralis muscle has also decreased.  Skin thickening of the left breast persists.  Level I and level II left axillary lymphadenopathy has markedly resolved.  The largest lymph node seen on today's study is a  level I lymph node that measures 0.8 cm short axis; this node has a normal thin cortex and normal fatty hilum.  Biopsy-proven malignancy in the lateral right breast shows a lower level of heterogeneous enhancement on today's examination and measures 1.4 x 1.3 x 1.4 cm (previously 2.1 x 2.0 x 2.2 cm). Central biopsy clip artifact is present.  No right axillary or internal mammary chain lymphadenopathy is identified.  IMPRESSION:  1.  Significant positive response to neoadjuvant therapy.  The dominant mass and multiple smaller nodules in the left breast have significantly decreased in enhancement as well as size.  Subareolar architecture distortion and skin thickening are not significantly changed. 2.  Level I and level II left axillary lymphadenopathy has resolved. 3.  Decrease in size and enhancement of lateral right breast invasive ductal carcinoma.  THREE-DIMENSIONAL MR IMAGE RENDERING ON INDEPENDENT WORKSTATION:  Three-dimensional MR images were rendered by post-processing of the original MR data on an independent workstation.  The three- dimensional MR images were interpreted, and findings were reported in the accompanying complete MRI report for this study.  BI-RADS CATEGORY 6:  Known biopsy-proven malignancy - appropriate action should be taken.  Original Report Authenticated By: Britta Mccreedy, M.D.    Discharge Laboratory Values: @cprlabs @  Brief H and P: For complete details please refer to admission H and P, but in brief, the patient was admitted with fever and neutropenia secondary to chemotherapy. Blood cultures were obtained in clinic, and the patient received the first dose of imipenem en route to floor. Chest X Ray and urine culture were obtained, and the patient was started on broad spectrum ABX--see Hospital Course for details  Physical Exam at Discharge: BP 116/76  Pulse 92  Temp(Src) 98.8 F (37.1 C) (Oral)  Resp 18  Ht 5\' 8"  (1.727 m)  Wt 268 lb (121.564 kg)  BMI 40.75 kg/m2  SpO2  95% Gen: patient is fatigued, with no focal complaints; ambulatory Cardiovascular:RRR, no murmur appreciated Respiratory: no rales or rhonchi, good excursion bilaterally Gastrointestinal: +BS, NT Extremities: no edema    Hospital Course:  The patient's Chest X-ray showed no PNA or infiltrate; urine and blood cultures remain negative; the patient had received neulasta day 2 of her most recent chemotherapy; neutrophils however remained well below 500 until the day of discharge,(today they are >1500). Imipenem and vancomycin were given in standard doses and the patient tolerated these w/o apparent side effects. She defervesced after 36 hours. At this point it is safe to send the patient home with an additional 4 days of oral antibiotics and close follow-up (3 days post d/c)  The patient's thrombocytopenia was moderate and allowed prophylactic anticoagulation. Her hemoglobin remained low and on the morning of discharge was <7; she will receive 2 units PRBS prior to discharge home.  The patient has kept a dry  cough, which improved with phenergan/codeine; however this made her somnolent. We are adding omeprazole to her home regimen on the possibility the cough may be secondary to reflux. Otherwise the plan will be to resume her planned chemotherapy once she is fully recovered.  Active Problems:  Fever and neutropenia  Thrombocytopenia Symptomatic anemia Persistent cough Breast cancer  Procedures: IV ABX Transfusion of blood products  Diet:  regular  Activity:  unrestricted  Condition at Discharge:   improved  Signed: Dr. Ruthann Cancer [phone]  10/17/2011, 8:00 AM

## 2011-10-19 LAB — TYPE AND SCREEN

## 2011-10-20 ENCOUNTER — Telehealth: Payer: Self-pay | Admitting: *Deleted

## 2011-10-20 ENCOUNTER — Ambulatory Visit (HOSPITAL_BASED_OUTPATIENT_CLINIC_OR_DEPARTMENT_OTHER): Payer: Medicare Other | Admitting: Physician Assistant

## 2011-10-20 ENCOUNTER — Other Ambulatory Visit (HOSPITAL_BASED_OUTPATIENT_CLINIC_OR_DEPARTMENT_OTHER): Payer: Medicare Other | Admitting: Lab

## 2011-10-20 ENCOUNTER — Encounter: Payer: Self-pay | Admitting: Physician Assistant

## 2011-10-20 VITALS — BP 139/80 | HR 99 | Temp 99.0°F | Ht 68.0 in | Wt 271.8 lb

## 2011-10-20 DIAGNOSIS — C50119 Malignant neoplasm of central portion of unspecified female breast: Secondary | ICD-10-CM

## 2011-10-20 DIAGNOSIS — C50019 Malignant neoplasm of nipple and areola, unspecified female breast: Secondary | ICD-10-CM

## 2011-10-20 DIAGNOSIS — Z170 Estrogen receptor positive status [ER+]: Secondary | ICD-10-CM

## 2011-10-20 DIAGNOSIS — C773 Secondary and unspecified malignant neoplasm of axilla and upper limb lymph nodes: Secondary | ICD-10-CM

## 2011-10-20 DIAGNOSIS — C50519 Malignant neoplasm of lower-outer quadrant of unspecified female breast: Secondary | ICD-10-CM

## 2011-10-20 LAB — CBC WITH DIFFERENTIAL/PLATELET
Basophils Absolute: 0 10*3/uL (ref 0.0–0.1)
Eosinophils Absolute: 0 10*3/uL (ref 0.0–0.5)
HCT: 28.6 % — ABNORMAL LOW (ref 34.8–46.6)
HGB: 9.5 g/dL — ABNORMAL LOW (ref 11.6–15.9)
LYMPH%: 4.8 % — ABNORMAL LOW (ref 14.0–49.7)
MCV: 90.2 fL (ref 79.5–101.0)
MONO%: 14.2 % — ABNORMAL HIGH (ref 0.0–14.0)
NEUT#: 4.7 10*3/uL (ref 1.5–6.5)
NEUT%: 80.8 % — ABNORMAL HIGH (ref 38.4–76.8)
Platelets: 223 10*3/uL (ref 145–400)
RBC: 3.17 10*6/uL — ABNORMAL LOW (ref 3.70–5.45)

## 2011-10-20 LAB — COMPREHENSIVE METABOLIC PANEL
CO2: 27 mEq/L (ref 19–32)
Calcium: 9.1 mg/dL (ref 8.4–10.5)
Chloride: 100 mEq/L (ref 96–112)
Creatinine, Ser: 0.6 mg/dL (ref 0.50–1.10)
Glucose, Bld: 114 mg/dL — ABNORMAL HIGH (ref 70–99)
Total Bilirubin: 0.3 mg/dL (ref 0.3–1.2)

## 2011-10-20 LAB — PATHOLOGIST SMEAR REVIEW

## 2011-10-20 LAB — CULTURE, BLOOD (SINGLE)

## 2011-10-20 NOTE — Progress Notes (Signed)
ID: Linda [name]   DOB: 25-Sep-1945  MR#: [id number]  EXB#:[id number]  HISTORY OF PRESENT ILLNESS: The patient noted a small amount of drainage from her left breast December of 2012. She brought it to her gynecologist's attention in January of 2013 and was set up for bilateral diagnostic mammography at the [facility] July 24, 2011. This was the patient's first ever mammogram. It showed a large irregular mass in the left retroareolar region extending to the nipple, with nipple retraction and skin thickening. This measured approximately 8.4 cm. It was associated with pleomorphic microcalcifications. By exam there was moderate distortion and retraction of the nipple with a large palpable ill-defined area in the retroareolar region. In the right right breast there was also a 2 cm hard mass palpated. Ultrasound of the right breast mass showed a complex cystic/solid area measuring 1.9 cm. Ultrasound of the right axilla was negative. Ultrasound of the left breast showed a large hypoechoic mass measuring at least 3.8 cm. The left axilla showed several adjacent abnormal appearing lymph nodes.  With this information biopsy of the right and left breast masses were obtained the same day, and showed ([id number])  (a) on the right, and invasive ductal carcinoma with papillary features, estrogen and progesterone receptor positive, HER-2 negative, with an MIB-1 of 10%.  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 The MIB-1 was 60% for the left-sided tumor.  With this information the patient was presented at the multidisciplinary breast cancer conference 08/06/2011. Subsequent evaluation and treatments are as detailed below.  INTERVAL HISTORY: Linda [name] returns today for follow up of her bilateral breast carcinomas. She is status post 4 dose dense cycles of doxorubicin/cyclophosphamide. After the fourth dose 2 weeks  ago, she was subsequently hospitalized with febrile neutropenia. She was discharged on Friday, May 3, after receiving IV antibiotics. She has continued on oral Cipro, which she will complete in 2 more days. She's had no additional fevers. She still little weak, has a slight residual dry cough, but denies any chills or night sweats.  I will mention that Linda [name] had a cough drop in her mouth when her temperature was taken at today's appointment, showing a false elevation of 101.7. This was repeated during exam and was back down to 99.0.    REVIEW OF SYSTEMS: Linda [name] is feeling much better today than with her last visit one week ago. She is eating and drinking better, and denies any nausea or change in bowel habits. She denies any signs of abnormal bleeding. She's had no rashes, or abnormal bleeding. Her feet swell a little late in the day, but this resolves with elevation overnight. She continues to have some shortness of breath which is being followed.it has not worsened.  A detailed review of systems is otherwise noncontributory.  PAST MEDICAL HISTORY: Past Medical History  Diagnosis Date  . Cancer 08-13-11    07-31-11-Dx. Bilarteral Breast cancer-left greater than rt.  . Hematuria, undiagnosed cause 08-13-11    Being evaluated by [facility] 08-14-11    PAST SURGICAL HISTORY: Past Surgical History  Procedure Date  . Child birth 08-13-11    x3 -NVD  . Portacath placement 08/15/2011    Procedure: INSERTION PORT-A-CATH;  Surgeon: Shelly Rubenstein, MD;  Location: [facility];  Service: General;  Laterality: N/A;    FAMILY HISTORY Family History  Problem Relation Age of Onset  . Heart disease Mother   . Heart  attack Father     GYNECOLOGIC HISTORY:  She does not recall when she had menarche. She had her first child at age 23. She is GX P3. She underwent menopause in her mid 62s. She never took hormone replacement.   SOCIAL HISTORY:  She worked in the past as a Corporate investment banker for [employer]  and also for [employer]. 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 She moved to [place] about 2 years ago but has a Museum/gallery curator in [place]. Son Thayer Ohm lives in [place] and works as a IT sales professional. His wife is a Engineer, civil (consulting). Son Onalee Hua lives at [employer] and is a Art therapist in addition to having the [employer]. The patient attends a local [employer] here   ADVANCED DIRECTIVES: Not in place  HEALTH MAINTENANCE: History  Substance Use Topics  . Smoking status: Never Smoker   . Smokeless tobacco: Never Used  . Alcohol Use: Yes     rare- occ.     Colonoscopy:  PAP:  Bone density:  Lipid panel:  Allergies  Allergen Reactions  . Ace Inhibitors Cough    Current Outpatient Prescriptions  Medication Sig Dispense Refill  . Alum & Mag Hydroxide-Simeth (MAGIC MOUTHWASH W/LIDOCAINE) SOLN Take 5 mLs by mouth 4 (four) times daily as needed.  300 mL  2  . amoxicillin-clavulanate (AUGMENTIN) 875-125 MG per tablet Take 1 tablet by mouth 2 (two) times daily.  8 tablet  0  . carvedilol (COREG) 3.125 MG tablet Take 1 tablet (3.125 mg total) by mouth 2 (two) times daily with a meal.  60 tablet  3  . ciprofloxacin (CIPRO) 500 MG tablet Take 1 tablet (500 mg total) by mouth 2 (two) times daily.  14 tablet  3  . Multiple Vitamin (MULTIVITAMIN) capsule Take 1 capsule by mouth daily. States only daily-will stop today.      [name] omeprazole (PRILOSEC) 40 MG capsule Take 1 capsule (40 mg total) by mouth daily.  30 capsule  12  . potassium chloride SA (K-DUR,KLOR-CON) 20 MEQ tablet Take 1 tablet (20 mEq total) by mouth daily.  30 tablet  6  . prochlorperazine (COMPAZINE) 10 MG tablet Take 10 mg by mouth every 6 (six) hours as needed.        OBJECTIVE: Filed Vitals:   10/20/11 1416  BP:   Pulse:   Temp: 99 F (37.2 C)     Body mass index is 41.33 kg/(m^2).    ECOG FS: 1  Physical Exam: HEENT:  Sclerae anicteric,  conjunctivae pink.  Oropharynx clear.  No mucositis or candidiasis.   Nodes:  No cervical, supraclavicular, or axillary lymphadenopathy palpated.  Breast Exam: Deferred Lungs:  Clear to auscultation bilaterally.  No crackles, rhonchi, or wheezes.   Heart:  Regular rate and rhythm.   Abdomen:  Soft, obese, nontender.  Positive bowel sounds.  No organomegaly or masses palpated.   Musculoskeletal:  No focal spinal tenderness to palpation.  Extremities:  Benign.  No peripheral edema or cyanosis.   Skin:  Benign.   Neuro:  Nonfocal. Alert and oriented x3.   LAB RESULTS: Lab Results  Component Value Date   WBC 5.8 10/20/2011   NEUTROABS 4.7 10/20/2011   HGB 9.5* 10/20/2011   HCT 28.6* 10/20/2011   MCV 90.2 10/20/2011   PLT 223 10/20/2011      Chemistry      Component Value Date/Time   NA  137 10/20/2011 1315   K 4.2 10/20/2011 1315   CL 100 10/20/2011 1315   CO2 27 10/20/2011 1315   BUN 4* 10/20/2011 1315   CREATININE 0.60 10/20/2011 1315      Component Value Date/Time   CALCIUM 9.1 10/20/2011 1315   ALKPHOS 58 10/20/2011 1315   AST 25 10/20/2011 1315   ALT 28 10/20/2011 1315   BILITOT 0.3 10/20/2011 1315       Lab Results  Component Value Date   LABCA2 87* 08/06/2011    STUDIES:  10/10/2011 BILATERAL BREAST MRI WITH AND WITHOUT CONTRAST  Technique: Multiplanar, multisequence MR images of both breasts  were obtained prior to and following the intravenous administration  of 20ml of Multihance. Three dimensional images were evaluated at  the independent DynaCad workstation.  Comparison: Breast MRI 08/05/2011  Findings: Enhancement of the markedly irregular subareolar left  breast mass with associated distortion and nipple retraction has  significantly decreased since the breast MRI of 08/05/2011. Some  of the central aspect of the dominant subareolar mass shows no  enhancement on the current exam. Although the margins are very ill-  defined, the dominant subareolar left breast mass measures    approximately 5.0 x 3.6 x 3.8 cm (previously 6.9 x 6.6 x 6.9 cm).  Biopsy clip artifact is seen within the mass. Similarly, the  previously described scattered subareolar nodules involving all  quadrants of the left breast have significantly decreased in  enhancement and size. Intensity of enhancement of the nodule  within the medial left pectoralis muscle has also decreased. Skin  thickening of the left breast persists.  Level I and level II left axillary lymphadenopathy has markedly  resolved. The largest lymph node seen on today's study is a level  I lymph node that measures 0.8 cm short axis; this node has a  normal thin cortex and normal fatty hilum.  Biopsy-proven malignancy in the lateral right breast shows a lower  level of heterogeneous enhancement on today's examination and  measures 1.4 x 1.3 x 1.4 cm (previously 2.1 x 2.0 x 2.2 cm).  Central biopsy clip artifact is present.  No right axillary or internal mammary chain lymphadenopathy is  identified.  IMPRESSION:  1. Significant positive response to neoadjuvant therapy. The  dominant mass and multiple smaller nodules in the left breast have  significantly decreased in enhancement as well as size. Subareolar  architecture distortion and skin thickening are not significantly  changed.  2. Level I and level II left axillary lymphadenopathy has  resolved.  3. Decrease in size and enhancement of lateral right breast  invasive ductal carcinoma.   ASSESSMENT: [name]   (1)  status post bilateral breast biopsies 07/24/2011, showing, on the right, a clinical T2 N0 papillary/ductal breast cancer, estrogen and progesterone receptor positive, HER-2 negative, with an MIB-1 of 10%; on the left, a T3 N1 invasive ductal carcinoma, grade 3, triple positive, with an MIB-1 of 60%.  (2)  now being treated in the neoadjuvant setting, the goal being to complete 4 dose dense cycles of doxorubicin/cyclophosphamide,  followed by 4 dose dense cycles of paclitaxel and trastuzumab, to trastuzumab then continued for a total of one year.  (3)  History of febrile chemo-induced neutropenia requiring hospitalization following fourth cycle of AC  (3)  the patient will need postmastectomy radiation at least on the left.  PLAN: We will give Linda [name] one more week off to recover from her recent hospitalization. She will return next week on May  14 at which time we expect to initiate her first dose dense cycle of paclitaxel with trastuzumab.  She will continue to receive Neulasta on day 2 of each cycle.  In the meanwhile, Linda [name] will call with any changes or problems.  Aris Even    10/20/2011

## 2011-10-20 NOTE — Telephone Encounter (Signed)
sent Linda [name] email to set up treatment 10-28-2011

## 2011-10-20 NOTE — Discharge Summary (Signed)
Physician Discharge Summary   Patient ID:  Linda [name]  MRN: [id number]  DOB/AGE: 1946/01/28 66 y.o.  Admit date: 10/14/2011  Discharge date: 10/17/2011  Primary Care Physician: Sherron Monday, MD, MD   Discharge Medications:  Medication List  As of 10/17/2011 8:00 AM    ASK your doctor about these medications          carvedilol 3.125 MG tablet      Commonly known as: COREG      Take 1 tablet (3.125 mg total) by mouth 2 (two) times daily with a meal.      ciprofloxacin 500 MG tablet      Commonly known as: CIPRO      Take 1 tablet (500 mg total) by mouth 2 (two) times daily.      DOXOrubicin 10 MG Solr      Commonly known as: ADRIAMYCIN      Inject 148 mg into the vein every 14 (fourteen) days.      losartan-hydrochlorothiazide 50-12.5 MG per tablet      Commonly known as: HYZAAR      Take 1 tablet by mouth daily.      magic mouthwash w/lidocaine Soln      Take 5 mLs by mouth 4 (four) times daily as needed.      multivitamin capsule      Take 1 capsule by mouth daily. States only daily-will stop today.      ondansetron 8 MG tablet      Commonly known as: ZOFRAN      Take 8 mg by mouth every 12 (twelve) hours as needed. Nausea      potassium chloride SA 20 MEQ tablet      Commonly known as: K-DUR,KLOR-CON      Take 1 tablet (20 mEq total) by mouth daily.      prochlorperazine 10 MG tablet      Commonly known as: COMPAZINE      Take 10 mg by mouth every 6 (six) hours as needed.      sodium chloride 0.9 % SOLN 500 mL with cyclophosphamide 2 G SOLR      Inject into the vein 2 (two) times a week.          Significant Diagnostic Studies:  X-ray Chest Pa And Lateral  10/14/2011 *RADIOLOGY REPORT* Clinical Data: Fever, cough, history of breast carcinoma CHEST - 2 VIEW Comparison: Chest x-ray of 08/15/2011 and PET CT of 08/21/2011 Findings: No active infiltrate is seen. No definite abnormality is noted at the site questioned posterior medially within the right upper lobe on  PET CT. The lungs appear clear and no effusion is noted. Mediastinal contours appear normal. The heart is mildly enlarged. There are degenerative changes throughout the thoracic spine. A right-sided Port-A-Cath is present with the tip in the mid SVC. IMPRESSION: No active lung disease. Right-sided Port-A-Cath tip in mid SVC. Original Report Authenticated By: Juline Patch, M.D.  Mr Breast Bilateral W Wo Contrast  10/10/2011 *RADIOLOGY REPORT* Clinical Data: Patient with bilateral breast cancer and left axillary metastatic lymphadenopathy undergoing neoadjuvant chemotherapy. Assess response to therapy. BILATERAL BREAST MRI WITH AND WITHOUT CONTRAST Technique: Multiplanar, multisequence MR images of both breasts were obtained prior to and following the intravenous administration of 20ml of Multihance. Three dimensional images were evaluated at the independent DynaCad workstation. Comparison: Breast MRI 08/05/2011 Findings: Enhancement of the markedly irregular subareolar left breast mass with associated distortion and nipple retraction has significantly decreased since the breast  MRI of 08/05/2011. Some of the central aspect of the dominant subareolar mass shows no enhancement on the current exam. Although the margins are very ill- defined, the dominant subareolar left breast mass measures approximately 5.0 x 3.6 x 3.8 cm (previously 6.9 x 6.6 x 6.9 cm). Biopsy clip artifact is seen within the mass. Similarly, the previously described scattered subareolar nodules involving all quadrants of the left breast have significantly decreased in enhancement and size. Intensity of enhancement of the nodule within the medial left pectoralis muscle has also decreased. Skin thickening of the left breast persists. Level I and level II left axillary lymphadenopathy has markedly resolved. The largest lymph node seen on today's study is a level I lymph node that measures 0.8 cm short axis; this node has a normal thin cortex and  normal fatty hilum. Biopsy-proven malignancy in the lateral right breast shows a lower level of heterogeneous enhancement on today's examination and measures 1.4 x 1.3 x 1.4 cm (previously 2.1 x 2.0 x 2.2 cm). Central biopsy clip artifact is present. No right axillary or internal mammary chain lymphadenopathy is identified. IMPRESSION: 1. Significant positive response to neoadjuvant therapy. The dominant mass and multiple smaller nodules in the left breast have significantly decreased in enhancement as well as size. Subareolar architecture distortion and skin thickening are not significantly changed. 2. Level I and level II left axillary lymphadenopathy has resolved. 3. Decrease in size and enhancement of lateral right breast invasive ductal carcinoma. THREE-DIMENSIONAL MR IMAGE RENDERING ON INDEPENDENT WORKSTATION: Three-dimensional MR images were rendered by post-processing of the original MR data on an independent workstation. The three- dimensional MR images were interpreted, and findings were reported in the accompanying complete MRI report for this study. BI-RADS CATEGORY 6: Known biopsy-proven malignancy - appropriate action should be taken. Original Report Authenticated By: Britta Mccreedy, M.D.   Discharge Laboratory Values:  Neutropenia resolved; she was significantly anemic but was transfused before going home.  Brief H and P:  For complete details please refer to admission H and P, but in brief, the patient was admitted with fever and neutropenia secondary to chemotherapy. Blood cultures were obtained in clinic, and the patient received the first dose of imipenem en route to floor. Chest X Ray and urine culture were obtained, and the patient was started on broad spectrum ABX--see Hospital Course for details   Physical Exam at Discharge:  BP 116/76  Pulse 92  Temp(Src) 98.8 F (37.1 C) (Oral)  Resp 18  Ht 5\' 8"  (1.727 m)  Wt 268 lb (121.564 kg)  BMI 40.75 kg/m2  SpO2 95%  Gen: patient is  fatigued, with no focal complaints; ambulatory  Cardiovascular:RRR, no murmur appreciated  Respiratory: no rales or rhonchi, good excursion bilaterally  Gastrointestinal: +BS, NT  Extremities: no edema   Hospital Course: The patient's Chest X-ray showed no PNA or infiltrate; urine and blood cultures remain negative; the patient had received neulasta day 2 of her most recent chemotherapy; neutrophils however remained well below 500 until the day of discharge,(today they are >1500). Imipenem and vancomycin were given in standard doses and the patient tolerated these w/o apparent side effects. She defervesced after 36 hours. At this point it is safe to send the patient home with an additional 4 days of oral antibiotics and close follow-up (3 days post d/c)   The patient's thrombocytopenia was moderate and allowed prophylactic anticoagulation. Her hemoglobin remained low and on the morning of discharge was <7; she will receive 2 units PRBS prior to  discharge home.   The patient has kept a dry cough, which improved with phenergan/codeine; however this made her somnolent. We are adding omeprazole to her home regimen on the possibility the cough may be secondary to reflux. Otherwise the plan will be to resume her planned chemotherapy once she is fully recovered.   Active Problems:  Fever and neutropenia  Thrombocytopenia  Symptomatic anemia  Persistent cough  Breast cancer   Procedures:  IV ABX  Transfusion of blood products   Diet: regular  Activity: unrestricted  Condition at Discharge: improved   Signed:  Dr. Ruthann Cancer  [phone]  10/17/2011, 8:00 AM

## 2011-10-20 NOTE — Telephone Encounter (Signed)
per orders from 10-20-2011 cancelled 10-21-2011 10-22-2011 and 11-05-2011 cancelled chemo on 11-04-2011 added on 10-28-2011 lab ab chemo injeciton on 10-29-2011 cancelled chemo 11-04-2011 keeping lab and [facility] and chemo 11-11-2011 injection on 11-12-2011 lab and [facility] 11-18-2011 lab [facility] 5 hour chemo on 11-25-2011 injection 11-26-2011

## 2011-10-20 NOTE — H&P (Signed)
ID: ARRYN TERRONES DOB: 1945/11/25 MR#: [id number] YNW#:[id number]    The patient was seen today in clinic and found to be febrile, with a temperature of 100.7. CBC was obtained which shows severe neutropenia. Accordingly the patient is being admitted for further evaluation and treatment.   HISTORY OF PRESENT ILLNESS:  The patient noted a small amount of drainage from her left breast December of 2012. She brought it to her gynecologist's attention in January of 2013 and was set up for bilateral diagnostic mammography at the breast Center July 24, 2011. This was the patient's first ever mammogram. It showed a large irregular mass in the left retroareolar region extending to the nipple, with nipple retraction and skin thickening. This measured approximately 8.4 cm. It was associated with pleomorphic microcalcifications. By exam there was moderate distortion and retraction of the nipple with a large palpable ill-defined area in the retroareolar region. In the right right breast there was also a 2 cm hard mass palpated. Ultrasound of the right breast mass showed a complex cystic/solid area measuring 1.9 cm. Ultrasound of the right axilla was negative. Ultrasound of the left breast showed a large hypoechoic mass measuring at least 3.8 cm. The left axilla showed several adjacent abnormal appearing lymph nodes.  With this information biopsy of the right and left breast masses were obtained the same day, and showed ([id number])  (a) on the right, and invasive ductal carcinoma with papillary features, estrogen and progesterone receptor positive, HER-2 negative, with an MIB-1 of 10%.  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 The MIB-1 was 60% for the left-sided tumor.  With this information the patient was presented at the multidisciplinary breast cancer conference 08/06/2011. Subsequent evaluation and  treatments are as detailed below.   REVIEW OF SYSTEMS:  [name] was not aware that she was having a fever. However she has been having chills for the past 24 hours. She keeps a dry cough, and is in the process of being evaluated for asthma. There has been no sputum production, no pleurisy, no hemoptysis, no shortness of breath. She has a little nausea from the cough, not from the chemotherapy. There has been no vomiting. There've been no unusual headaches, visual changes, or change in bowel or bladder habits. A detailed review of systems today was otherwise noncontributory   PAST MEDICAL HISTORY:  Past Medical History   Diagnosis  Date   .  Cancer  08-13-11     07-31-11-Dx. Bilarteral Breast cancer-left greater than rt.   .  Hematuria, undiagnosed cause  08-13-11     Being evaluated by [facility] 08-14-11    PAST SURGICAL HISTORY:  Past Surgical History   Procedure  Date   .  Child birth  08-13-11     x3 -NVD   .  Portacath placement  08/15/2011     Procedure: INSERTION PORT-A-CATH; Surgeon: Shelly Rubenstein, MD; Location: [facility]; Service: General; Laterality: N/A;    FAMILY HISTORY  Family History   Problem  Relation  Age of Onset   .  Heart disease  Mother    .  Heart attack  Father     GYNECOLOGIC HISTORY:  She does not recall when she had menarche. She had her first child at age 66. She is GX P3. She underwent menopause in her mid 65s. She never took hormone replacement.   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 She moved to [place] about 2 years ago but has a Museum/gallery curator in [place]. Son Thayer Ohm lives in [place] and works as a IT sales professional. His wife is a Engineer, civil (consulting). Son Onalee Hua lives at [employer] and is a Art therapist in addition to having the [employer]. The patient attends a local [employer] here    ADVANCED DIRECTIVES: Not in place   HEALTH MAINTENANCE:  History   Substance Use Topics   .  Smoking status:  Never Smoker   .  Smokeless tobacco:  Not on file   .  Alcohol Use:  Yes      rare- occ.   Colonoscopy:  PAP:  Bone density:  Lipid panel:   Allergies   Allergen  Reactions   .  Ace Inhibitors  Cough    Current Outpatient Prescriptions   Medication  Sig  Dispense  Refill   .  Alum & Mag Hydroxide-Simeth (MAGIC MOUTHWASH W/LIDOCAINE) SOLN  Take 5 mLs by mouth 4 (four) times daily as needed.  300 mL  2   .  carvedilol (COREG) 3.125 MG tablet  Take 1 tablet (3.125 mg total) by mouth 2 (two) times daily with a meal.  60 tablet  3   .  ciprofloxacin (CIPRO) 500 MG tablet  Take 1 tablet (500 mg total) by mouth 2 (two) times daily.  14 tablet  3   .  DOXOrubicin (ADRIAMYCIN) 10 MG SOLR  Inject 148 mg into the vein every 14 (fourteen) days.     [name]  losartan-hydrochlorothiazide (HYZAAR) 50-12.5 MG per tablet  Take 1 tablet by mouth daily.  30 tablet  6   .  Multiple Vitamin (MULTIVITAMIN) capsule  Take 1 capsule by mouth daily. States only daily-will stop today.     .  ondansetron (ZOFRAN) 8 MG tablet  Take 8 mg by mouth every 12 (twelve) hours as needed.     .  potassium chloride SA (K-DUR,KLOR-CON) 20 MEQ tablet  Take 1 tablet (20 mEq total) by mouth daily.  30 tablet  6   .  prochlorperazine (COMPAZINE) 10 MG tablet  Take 10 mg by mouth every 6 (six) hours as needed.     .  sodium chloride 0.9 % SOLN 500 mL with cyclophosphamide 2 G SOLR  Inject into the vein 2 (two) times a week.      OBJECTIVE:  Vitals are noted separately and are stable except for the elevated temperature HEENT: Sclerae anicteric, conjunctivae pink. Oropharynx clear. No mucositis or candidiasis. Mild pharyngeal erythema, no exudate.  Nodes: No cervical, supraclavicular, or axillary lymphadenopathy palpated.  Breast Exam: Deferred  Lungs: Clear to auscultation bilaterally. No crackles, rhonchi, or wheezes.    Heart: Regular rate and rhythm, no murmur appreciated Abdomen: Soft, obese, nontender. Positive bowel sounds. No organomegaly or masses palpated.  Musculoskeletal: No focal spinal tenderness to palpation.  Extremities: Benign. No peripheral edema or cyanosis.  Neuro: Nonfocal. Alert and oriented x3.    ASSESSMENT:  [name]  (1) status post bilateral breast biopsies 07/24/2011, showing, on the right, a clinical T2 N0 papillary/ductal breast cancer, estrogen and progesterone receptor positive, HER-2 negative, with an MIB-1 of 10%; on the left, a T3 N1 invasive ductal carcinoma, grade 3, triple positive, with an MIB-1 of 60%.  (2)  now being treated in the neoadjuvant setting, the goal being to complete 4 dose dense cycles of doxorubicin/cyclophosphamide, followed by 4 dose dense cycles of paclitaxel and trastuzumab, to trastuzumab then continued for a total of one year.  (3) now with febrile neutropenia, day 8 cycle 4 of her adjuvant chemotherapy with cytoxan/adriamycin  PLAN:  I am admitting Pat for fever workup and IV broad-spectrum antibiotics. We drew blood cultures while waiting for the room assignment and gave her the first dose of imipenem. She will need vancomycin as well since she has a port and spiked through a quinolone. There are no localizing symptoms and blood pressure and heart rate are stable, so no need for ICU. She is a full code.

## 2011-10-21 ENCOUNTER — Other Ambulatory Visit: Payer: Medicare Other | Admitting: Lab

## 2011-10-21 ENCOUNTER — Telehealth: Payer: Self-pay | Admitting: *Deleted

## 2011-10-21 ENCOUNTER — Ambulatory Visit: Payer: Medicare Other

## 2011-10-21 ENCOUNTER — Ambulatory Visit: Payer: Medicare Other | Admitting: Physician Assistant

## 2011-10-21 NOTE — Telephone Encounter (Signed)
per orders from 10-20-2011 cancelled 10-21-2011 10-22-2011 11-05-2011 add on 10-28-2011 injection on 10-29-2011 cancelled chemo on 11-04-2011 keeping lab and md on same day 05-21-213 lab and chemo on 11-11-2011 lab and ab 11-18-2011 lab and ab chemo 11-25-2011 injeciton 11-26-2011

## 2011-10-21 NOTE — Telephone Encounter (Signed)
Per e-mail frmo Jacki Cones, I have scheduled the patient a treatment appt for May 29th. Books closed for MAy 28th. Jacki Cones aware appt in computer.  JMW

## 2011-10-22 ENCOUNTER — Telehealth: Payer: Self-pay | Admitting: *Deleted

## 2011-10-22 ENCOUNTER — Ambulatory Visit: Payer: Medicare Other

## 2011-10-22 NOTE — Telephone Encounter (Signed)
Per e-mail from [place], I have scheduled the treatment appt for 5/14th. Appt in computer and Jacki Cones aware. JMW

## 2011-10-23 ENCOUNTER — Other Ambulatory Visit: Payer: Self-pay | Admitting: Physician Assistant

## 2011-10-23 ENCOUNTER — Ambulatory Visit (INDEPENDENT_AMBULATORY_CARE_PROVIDER_SITE_OTHER): Payer: Medicare Other | Admitting: Surgery

## 2011-10-23 ENCOUNTER — Telehealth: Payer: Self-pay | Admitting: Oncology

## 2011-10-23 ENCOUNTER — Encounter (INDEPENDENT_AMBULATORY_CARE_PROVIDER_SITE_OTHER): Payer: Self-pay | Admitting: Surgery

## 2011-10-23 VITALS — BP 118/62 | HR 78 | Resp 16 | Ht 68.0 in | Wt 264.2 lb

## 2011-10-23 DIAGNOSIS — C50919 Malignant neoplasm of unspecified site of unspecified female breast: Secondary | ICD-10-CM

## 2011-10-23 NOTE — Progress Notes (Signed)
Subjective:     Patient ID: Linda [name], female   DOB: [birth year], 65 y.o.   MRN: [id number]  HPI She is here for followup. She is undergoing chemotherapy for bilateral breast cancers. She is doing fairly well and her most recent MRI shows significant improvement on both sides  Review of Systems     Objective:   Physical Exam    Bilateral breast exams performed. I cannot feel any adenopathy on either side. The obvious cancer on the left side is improving Assessment:     Bilateral breast cancers currently undergoing chemotherapy    Plan:     She has 4 cycles to complete. I will see her back in 2 months

## 2011-10-23 NOTE — Telephone Encounter (Signed)
lmonvm adviisng the pt of her updated appt time on 10/28/2011

## 2011-10-28 ENCOUNTER — Other Ambulatory Visit (HOSPITAL_BASED_OUTPATIENT_CLINIC_OR_DEPARTMENT_OTHER): Payer: Medicare Other | Admitting: Lab

## 2011-10-28 ENCOUNTER — Telehealth: Payer: Self-pay | Admitting: *Deleted

## 2011-10-28 ENCOUNTER — Ambulatory Visit (HOSPITAL_BASED_OUTPATIENT_CLINIC_OR_DEPARTMENT_OTHER): Payer: Medicare Other | Admitting: Physician Assistant

## 2011-10-28 ENCOUNTER — Encounter: Payer: Self-pay | Admitting: Physician Assistant

## 2011-10-28 ENCOUNTER — Ambulatory Visit (HOSPITAL_BASED_OUTPATIENT_CLINIC_OR_DEPARTMENT_OTHER): Payer: Medicare Other

## 2011-10-28 VITALS — BP 128/72 | HR 81 | Temp 98.1°F

## 2011-10-28 VITALS — BP 137/85 | HR 99 | Temp 98.2°F | Ht 68.0 in | Wt 262.5 lb

## 2011-10-28 DIAGNOSIS — C50019 Malignant neoplasm of nipple and areola, unspecified female breast: Secondary | ICD-10-CM

## 2011-10-28 DIAGNOSIS — Z5111 Encounter for antineoplastic chemotherapy: Secondary | ICD-10-CM

## 2011-10-28 DIAGNOSIS — C50519 Malignant neoplasm of lower-outer quadrant of unspecified female breast: Secondary | ICD-10-CM

## 2011-10-28 DIAGNOSIS — C50119 Malignant neoplasm of central portion of unspecified female breast: Secondary | ICD-10-CM

## 2011-10-28 DIAGNOSIS — Z9010 Acquired absence of unspecified breast and nipple: Secondary | ICD-10-CM

## 2011-10-28 DIAGNOSIS — C773 Secondary and unspecified malignant neoplasm of axilla and upper limb lymph nodes: Secondary | ICD-10-CM

## 2011-10-28 LAB — CBC WITH DIFFERENTIAL/PLATELET
Eosinophils Absolute: 0 10*3/uL (ref 0.0–0.5)
HCT: 31.5 % — ABNORMAL LOW (ref 34.8–46.6)
LYMPH%: 14.1 % (ref 14.0–49.7)
MONO#: 0.9 10*3/uL (ref 0.1–0.9)
NEUT#: 3.4 10*3/uL (ref 1.5–6.5)
NEUT%: 66.9 % (ref 38.4–76.8)
Platelets: 448 10*3/uL — ABNORMAL HIGH (ref 145–400)
RBC: 3.5 10*6/uL — ABNORMAL LOW (ref 3.70–5.45)
WBC: 5.1 10*3/uL (ref 3.9–10.3)
lymph#: 0.7 10*3/uL — ABNORMAL LOW (ref 0.9–3.3)
nRBC: 0 % (ref 0–0)

## 2011-10-28 MED ORDER — DIPHENHYDRAMINE HCL 50 MG/ML IJ SOLN
50.0000 mg | Freq: Once | INTRAMUSCULAR | Status: AC
  Administered 2011-10-28: 50 mg via INTRAVENOUS

## 2011-10-28 MED ORDER — TRASTUZUMAB CHEMO INJECTION 440 MG
4.0000 mg/kg | Freq: Once | INTRAVENOUS | Status: AC
  Administered 2011-10-28: 483 mg via INTRAVENOUS
  Filled 2011-10-28: qty 23

## 2011-10-28 MED ORDER — HEPARIN SOD (PORK) LOCK FLUSH 100 UNIT/ML IV SOLN
500.0000 [IU] | Freq: Once | INTRAVENOUS | Status: AC | PRN
  Administered 2011-10-28: 500 [IU]
  Filled 2011-10-28: qty 5

## 2011-10-28 MED ORDER — SODIUM CHLORIDE 0.9 % IV SOLN
Freq: Once | INTRAVENOUS | Status: AC
  Administered 2011-10-28: 10:00:00 via INTRAVENOUS

## 2011-10-28 MED ORDER — PACLITAXEL CHEMO INJECTION 300 MG/50ML
175.0000 mg/m2 | Freq: Once | INTRAVENOUS | Status: AC
  Administered 2011-10-28: 420 mg via INTRAVENOUS
  Filled 2011-10-28: qty 70

## 2011-10-28 MED ORDER — DEXAMETHASONE SODIUM PHOSPHATE 4 MG/ML IJ SOLN
20.0000 mg | Freq: Once | INTRAMUSCULAR | Status: AC
  Administered 2011-10-28: 20 mg via INTRAVENOUS

## 2011-10-28 MED ORDER — ACETAMINOPHEN 325 MG PO TABS
650.0000 mg | ORAL_TABLET | Freq: Once | ORAL | Status: AC
  Administered 2011-10-28: 650 mg via ORAL

## 2011-10-28 MED ORDER — ONDANSETRON 8 MG/50ML IVPB (CHCC)
8.0000 mg | Freq: Once | INTRAVENOUS | Status: AC
  Administered 2011-10-28: 8 mg via INTRAVENOUS

## 2011-10-28 MED ORDER — SODIUM CHLORIDE 0.9 % IJ SOLN
10.0000 mL | INTRAMUSCULAR | Status: DC | PRN
  Administered 2011-10-28: 10 mL
  Filled 2011-10-28: qty 10

## 2011-10-28 MED ORDER — FAMOTIDINE IN NACL 20-0.9 MG/50ML-% IV SOLN
20.0000 mg | Freq: Once | INTRAVENOUS | Status: AC
  Administered 2011-10-28: 20 mg via INTRAVENOUS

## 2011-10-28 MED ORDER — DIPHENHYDRAMINE HCL 25 MG PO CAPS
50.0000 mg | ORAL_CAPSULE | Freq: Once | ORAL | Status: AC
  Administered 2011-10-28: 50 mg via ORAL

## 2011-10-28 NOTE — Progress Notes (Signed)
ID: ELLIOTT [name]   DOB: November 20, 1945  MR#: [id number]  [name]#:[id number]  HISTORY OF PRESENT ILLNESS: The patient noted a small amount of drainage from her left breast December of 2012. She brought it to her gynecologist's attention in January of 2013 and was set up for bilateral diagnostic mammography at the breast Center July 24, 2011. This was the patient's first ever mammogram. It showed a large irregular mass in the left retroareolar region extending to the nipple, with nipple retraction and skin thickening. This measured approximately 8.4 cm. It was associated with pleomorphic microcalcifications. By exam there was moderate distortion and retraction of the nipple with a large palpable ill-defined area in the retroareolar region. In the right right breast there was also a 2 cm hard mass palpated. Ultrasound of the right breast mass showed a complex cystic/solid area measuring 1.9 cm. Ultrasound of the right axilla was negative. Ultrasound of the left breast showed a large hypoechoic mass measuring at least 3.8 cm. The left axilla showed several adjacent abnormal appearing lymph nodes.  With this information biopsy of the right and left breast masses were obtained the same day, and showed ([id number])  (a) on the right, and invasive ductal carcinoma with papillary features, estrogen and progesterone receptor positive, HER-2 negative, with an MIB-1 of 10%.  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 The MIB-1 was 60% for the left-sided tumor.  With this information the patient was presented at the multidisciplinary breast cancer conference 08/06/2011. Subsequent evaluation and treatments are as detailed below.  INTERVAL HISTORY: Linda [name] returns today for follow up of her bilateral breast carcinomas. She is status post 4 dose dense cycles of doxorubicin/cyclophosphamide, and is ready to initiate her  first of 4 planned dose dense cycles of paclitaxel/trastuzumab today in the neoadjuvant setting.  Linda [name] has recovered well from her recent hospitalization for febrile neutropenia. She completed her course of antibiotics and has had no additional fevers, chills, or night sweats. Interval history is otherwise unremarkable.   REVIEW OF SYSTEMS: Linda [name] is feeling well today, with few complaints. Her energy level has improved. Her cough has almost completely resolved. She's had no pleurisy or increased shortness of breath. She's eating and drinking well, with no nausea or change in bowel habits. No abnormal headaches or dizziness.at baseline, no signs of peripheral neuropathy.  A detailed review of systems is otherwise noncontributory.  PAST MEDICAL HISTORY: Past Medical History  Diagnosis Date  . Cancer 08-13-11    07-31-11-Dx. Bilarteral Breast cancer-left greater than rt.  . Hematuria, undiagnosed cause 08-13-11    Being evaluated by [facility] 08-14-11    PAST SURGICAL HISTORY: Past Surgical History  Procedure Date  . Child birth 08-13-11    x3 -NVD  . Portacath placement 08/15/2011    Procedure: INSERTION PORT-A-CATH;  Surgeon: Shelly Rubenstein, MD;  Location: [facility];  Service: General;  Laterality: N/A;    FAMILY HISTORY Family History  Problem Relation Age of Onset  . Heart disease Mother   . Heart attack Father   The patient's father died from a heart attack at the age of 59. The patient's mother died from apparently heart problems at the age of 21. The patient had no brothers. She has 3 sisters. One of her sisters was diagnosed with breast cancer in her mid 91s. The patient does not know if his sister was ever genetically tested. The patient's mother had  a mastectomy at the age of 82, presumably for breast cancer. There is no other history of breast or in cancer in the family to her knowledge.   GYNECOLOGIC HISTORY:  She does not recall when she had menarche. She had her first  child at age 25. She is GX P3. She underwent menopause in her mid 27s. She never took hormone replacement.   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 She moved to [place] about 2 years ago but has a Museum/gallery curator in [place]. Son Thayer Ohm lives in [place] and works as a IT sales professional. His wife is a Engineer, civil (consulting). Son Onalee Hua lives at [employer] and is a Art therapist in addition to having the [employer]. The patient attends a local [employer] here   ADVANCED DIRECTIVES: Not in place  HEALTH MAINTENANCE: History  Substance Use Topics  . Smoking status: Never Smoker   . Smokeless tobacco: Never Used  . Alcohol Use: Yes     rare- occ.     Colonoscopy: Never  PAP: Feb 2013  Bone density: Never  Lipid panel:  Allergies  Allergen Reactions  . Ace Inhibitors Cough    Current Outpatient Prescriptions  Medication Sig Dispense Refill  . Alum & Mag Hydroxide-Simeth (MAGIC MOUTHWASH W/LIDOCAINE) SOLN Take 5 mLs by mouth 4 (four) times daily as needed.  300 mL  2  . amoxicillin-clavulanate (AUGMENTIN) 875-125 MG per tablet Take 1 tablet by mouth 2 (two) times daily.  8 tablet  0  . carvedilol (COREG) 3.125 MG tablet Take 1 tablet (3.125 mg total) by mouth 2 (two) times daily with a meal.  60 tablet  3  . ciprofloxacin (CIPRO) 500 MG tablet Take 1 tablet (500 mg total) by mouth 2 (two) times daily.  14 tablet  3  . Multiple Vitamin (MULTIVITAMIN) capsule Take 1 capsule by mouth daily. States only daily-will stop today.      [name] omeprazole (PRILOSEC) 40 MG capsule Take 1 capsule (40 mg total) by mouth daily.  30 capsule  12  . potassium chloride SA (K-DUR,KLOR-CON) 20 MEQ tablet Take 1 tablet (20 mEq total) by mouth daily.  30 tablet  6  . prochlorperazine (COMPAZINE) 10 MG tablet Take 10 mg by mouth every 6 (six) hours as  needed.       No current facility-administered medications for this visit.   Facility-Administered Medications Ordered in Other Visits  Medication Dose Route Frequency Provider Last Rate Last Dose  . 0.9 %  sodium chloride infusion   Intravenous Once Catalina Gravel, PA 20 mL/hr at 10/28/11 1019    . acetaminophen (TYLENOL) tablet 650 mg  650 mg Oral Once Margarine Grosshans G Dmoni Fortson, PA      . dexamethasone (DECADRON) injection 20 mg  20 mg Intravenous Once Catalina Gravel, PA   20 mg at 10/28/11 1023  . diphenhydrAMINE (BENADRYL) injection 50 mg  50 mg Intravenous Once Catalina Gravel, PA   50 mg at 10/28/11 1023  . famotidine (PEPCID) IVPB 20 mg  20 mg Intravenous Once Catalina Gravel, PA   20 mg at 10/28/11 1040  . heparin lock flush 100 unit/mL  500 Units Intracatheter Once PRN Catalina Gravel, PA      . ondansetron (ZOFRAN) IVPB 8 mg  8 mg Intravenous  Once Catalina Gravel, PA   8 mg at 10/28/11 1023  . PACLitaxel (TAXOL) 420 mg in dextrose 5 % 500 mL chemo infusion (> 80mg /m2)  175 mg/m2 (Treatment Plan Actual) Intravenous Once Catalina Gravel, PA 167 mL/hr at 10/28/11 1200 420 mg at 10/28/11 1200  . sodium chloride 0.9 % injection 10 mL  10 mL Intracatheter PRN Javares Kaufhold Allegra Grana, PA      . trastuzumab (HERCEPTIN) 483 mg in sodium chloride 0.9 % 250 mL chemo infusion  4 mg/kg (Treatment Plan Actual) Intravenous Once Natasja Niday Allegra Grana, PA        OBJECTIVE: Filed Vitals:   10/28/11 0928  BP: 137/85  Pulse: 99  Temp: 98.2 F (36.8 C)     Body mass index is 39.91 kg/(m^2).    ECOG FS: 1  Physical Exam: HEENT:  Sclerae anicteric, conjunctivae pink.  Oropharynx clear.  No mucositis or candidiasis.   Nodes:  No cervical, supraclavicular, or axillary lymphadenopathy palpated.  Breast Exam: Right breast, unable to palpate a distinct mass today.  No skin changes. In the left breast, there is still a palpable abnormality in the central portion of the breast as well as architectural distortion of the areolar complex itself. The skin is thickened,  but there is no additional skin change noted. Lungs:  Clear to auscultation bilaterally.  No crackles, rhonchi, or wheezes.   Heart:  Regular rate and rhythm.   Abdomen:  Soft, obese, nontender.  Positive bowel sounds.  No organomegaly or masses palpated.   Musculoskeletal:  No focal spinal tenderness to palpation.  Extremities:  Benign.  No peripheral edema or cyanosis.   Skin:  Benign.   Neuro:  Nonfocal. Alert and oriented x3.   LAB RESULTS: Lab Results  Component Value Date   WBC 5.1 10/28/2011   NEUTROABS 3.4 10/28/2011   HGB 10.1* 10/28/2011   HCT 31.5* 10/28/2011   MCV 90.0 10/28/2011   PLT 448* 10/28/2011      Chemistry      Component Value Date/Time   NA 137 10/20/2011 1315   K 4.2 10/20/2011 1315   CL 100 10/20/2011 1315   CO2 27 10/20/2011 1315   BUN 4* 10/20/2011 1315   CREATININE 0.60 10/20/2011 1315      Component Value Date/Time   CALCIUM 9.1 10/20/2011 1315   ALKPHOS 58 10/20/2011 1315   AST 25 10/20/2011 1315   ALT 28 10/20/2011 1315   BILITOT 0.3 10/20/2011 1315       Lab Results  Component Value Date   LABCA2 87* 08/06/2011    STUDIES:  10/10/2011 BILATERAL BREAST MRI WITH AND WITHOUT CONTRAST  Technique: Multiplanar, multisequence MR images of both breasts  were obtained prior to and following the intravenous administration  of 20ml of Multihance. Three dimensional images were evaluated at  the independent DynaCad workstation.  Comparison: Breast MRI 08/05/2011  Findings: Enhancement of the markedly irregular subareolar left  breast mass with associated distortion and nipple retraction has  significantly decreased since the breast MRI of 08/05/2011. Some  of the central aspect of the dominant subareolar mass shows no  enhancement on the current exam. Although the margins are very ill-  defined, the dominant subareolar left breast mass measures  approximately 5.0 x 3.6 x 3.8 cm (previously 6.9 x 6.6 x 6.9 cm).  Biopsy clip artifact is seen within the mass.  Similarly, the  previously described scattered subareolar nodules involving all  quadrants of the left breast have significantly decreased  in  enhancement and size. Intensity of enhancement of the nodule  within the medial left pectoralis muscle has also decreased. Skin  thickening of the left breast persists.  Level I and level II left axillary lymphadenopathy has markedly  resolved. The largest lymph node seen on today's study is a level  I lymph node that measures 0.8 cm short axis; this node has a  normal thin cortex and normal fatty hilum.  Biopsy-proven malignancy in the lateral right breast shows a lower  level of heterogeneous enhancement on today's examination and  measures 1.4 x 1.3 x 1.4 cm (previously 2.1 x 2.0 x 2.2 cm).  Central biopsy clip artifact is present.  No right axillary or internal mammary chain lymphadenopathy is  identified.  IMPRESSION:  1. Significant positive response to neoadjuvant therapy. The  dominant mass and multiple smaller nodules in the left breast have  significantly decreased in enhancement as well as size. Subareolar  architecture distortion and skin thickening are not significantly  changed.  2. Level I and level II left axillary lymphadenopathy has  resolved.  3. Decrease in size and enhancement of lateral right breast  invasive ductal carcinoma.   ASSESSMENT: [name]   (1)  status post bilateral breast biopsies 07/24/2011, showing, on the right, a clinical T2 N0 papillary/ductal breast cancer, estrogen and progesterone receptor positive, HER-2 negative, with an MIB-1 of 10%; on the left, a T3 N1 invasive ductal carcinoma, grade 3, triple positive, with an MIB-1 of 60%.  (2)  now being treated in the neoadjuvant setting, the goal being to complete 4 dose dense cycles of doxorubicin/cyclophosphamide, followed by 4 dose dense cycles of paclitaxel and trastuzumab, to trastuzumab then continued for a total of one  year.  (3)  History of febrile chemo-induced neutropenia requiring hospitalization following fourth cycle of AC  (3)  the patient will need postmastectomy radiation at least on the left.  PLAN: Linda [name] will proceed to treatment today as scheduled for her first dose of dose dense paclitaxel with trastuzumab. She will continue to receive Neulasta on day 2 of each cycle. We will continue to follow her blood counts very closely. She has all of her antinausea medications on hand at home and will continue take these as before. She will see Dr. Darnelle Catalan for followup next week on May 21st to assess tolerance.  In the meanwhile, Linda [name] will call with any changes or problems.  [place]    10/28/2011

## 2011-10-28 NOTE — Telephone Encounter (Signed)
Per staff message from [place], I have scheduled the next treatment appt. Appt in and Crystal aware. JMW

## 2011-10-29 ENCOUNTER — Telehealth: Payer: Self-pay | Admitting: *Deleted

## 2011-10-29 ENCOUNTER — Ambulatory Visit (HOSPITAL_BASED_OUTPATIENT_CLINIC_OR_DEPARTMENT_OTHER): Payer: Medicare Other

## 2011-10-29 VITALS — BP 150/71 | HR 90 | Temp 97.6°F

## 2011-10-29 DIAGNOSIS — C50519 Malignant neoplasm of lower-outer quadrant of unspecified female breast: Secondary | ICD-10-CM

## 2011-10-29 DIAGNOSIS — C50119 Malignant neoplasm of central portion of unspecified female breast: Secondary | ICD-10-CM

## 2011-10-29 DIAGNOSIS — C50019 Malignant neoplasm of nipple and areola, unspecified female breast: Secondary | ICD-10-CM

## 2011-10-29 MED ORDER — PEGFILGRASTIM INJECTION 6 MG/0.6ML
6.0000 mg | Freq: Once | SUBCUTANEOUS | Status: AC
  Administered 2011-10-29: 6 mg via SUBCUTANEOUS
  Filled 2011-10-29: qty 0.6

## 2011-10-29 NOTE — Telephone Encounter (Signed)
printed out calendar and gave to the patient on 10-29-2011

## 2011-11-04 ENCOUNTER — Ambulatory Visit: Payer: Medicare Other

## 2011-11-04 ENCOUNTER — Other Ambulatory Visit (HOSPITAL_BASED_OUTPATIENT_CLINIC_OR_DEPARTMENT_OTHER): Payer: Medicare Other | Admitting: Lab

## 2011-11-04 ENCOUNTER — Ambulatory Visit (HOSPITAL_BASED_OUTPATIENT_CLINIC_OR_DEPARTMENT_OTHER): Payer: Medicare Other | Admitting: Oncology

## 2011-11-04 VITALS — BP 119/79 | HR 98 | Temp 98.4°F | Ht 68.0 in | Wt 264.4 lb

## 2011-11-04 DIAGNOSIS — Z170 Estrogen receptor positive status [ER+]: Secondary | ICD-10-CM

## 2011-11-04 DIAGNOSIS — C50119 Malignant neoplasm of central portion of unspecified female breast: Secondary | ICD-10-CM

## 2011-11-04 LAB — CBC WITH DIFFERENTIAL/PLATELET
Basophils Absolute: 0.1 10*3/uL (ref 0.0–0.1)
EOS%: 0.4 % (ref 0.0–7.0)
Eosinophils Absolute: 0.1 10*3/uL (ref 0.0–0.5)
HCT: 30.9 % — ABNORMAL LOW (ref 34.8–46.6)
HGB: 10 g/dL — ABNORMAL LOW (ref 11.6–15.9)
MCH: 29.2 pg (ref 25.1–34.0)
MCV: 90 fL (ref 79.5–101.0)
NEUT#: 31.3 10*3/uL — ABNORMAL HIGH (ref 1.5–6.5)
NEUT%: 90.3 % — ABNORMAL HIGH (ref 38.4–76.8)
RDW: 16 % — ABNORMAL HIGH (ref 11.2–14.5)
lymph#: 1.4 10*3/uL (ref 0.9–3.3)

## 2011-11-04 LAB — COMPREHENSIVE METABOLIC PANEL
Albumin: 3.4 g/dL — ABNORMAL LOW (ref 3.5–5.2)
BUN: 18 mg/dL (ref 6–23)
CO2: 25 mEq/L (ref 19–32)
Calcium: 9.4 mg/dL (ref 8.4–10.5)
Glucose, Bld: 173 mg/dL — ABNORMAL HIGH (ref 70–99)
Potassium: 4 mEq/L (ref 3.5–5.3)
Sodium: 139 mEq/L (ref 135–145)
Total Protein: 6.5 g/dL (ref 6.0–8.3)

## 2011-11-04 NOTE — Progress Notes (Signed)
ID: LOWELL MCGURK   DOB: Aug 11, 1945  MR#: [id number]  WUJ#:[id number]  HISTORY OF PRESENT ILLNESS: The patient noted a small amount of drainage from her left breast December of 2012. She brought it to her gynecologist's attention in January of 2013 and was set up for bilateral diagnostic mammography at the [facility] Center July 24, 2011. This was the patient's first ever mammogram. It showed a large irregular mass in the left retroareolar region extending to the nipple, with nipple retraction and skin thickening. This measured approximately 8.4 cm. It was associated with pleomorphic microcalcifications. By exam there was moderate distortion and retraction of the nipple with a large palpable ill-defined area in the retroareolar region. In the right right breast there was also a 2 cm hard mass palpated. Ultrasound of the right breast mass showed a complex cystic/solid area measuring 1.9 cm. Ultrasound of the right axilla was negative. Ultrasound of the left breast showed a large hypoechoic mass measuring at least 3.8 cm. The left axilla showed several adjacent abnormal appearing lymph nodes.  With this information biopsy of the right and left breast masses were obtained the same day, and showed ([id number])  (a) on the right, and invasive ductal carcinoma with papillary features, estrogen and progesterone receptor positive, HER-2 negative, with an MIB-1 of 10%.  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 The MIB-1 was 60% for the left-sided tumor.  With this information the patient was presented at the multidisciplinary breast cancer conference 08/06/2011. Subsequent evaluation and treatments are as detailed below.  INTERVAL HISTORY: [name] returns today for follow up of her bilateral breast carcinomas. Today is day 8 of her first cycle of paclitaxel. She finds this is a lot easier than the earlier  chemotherapy. In fact when asked what her worse problem is from this chemotherapy, "there is no worse problem".  REVIEW OF SYSTEMS: She has had no fever, bleeding, or rash. She has not noted any nails changes. She has had no nausea or vomiting. There has been no bowel change, no problems with bladder function, and no mouth sores or mouth changes. She has had a little bit of tingling in her toes and ankles, but this is fleeting, it does not persist, and I am not sure it is really related to the Taxol. A detailed review of systems was otherwise noncontributory.  PAST MEDICAL HISTORY: Past Medical History  Diagnosis Date  . Cancer 08-13-11    07-31-11-Dx. Bilarteral Breast cancer-left greater than rt.  . Hematuria, undiagnosed cause 08-13-11    Being evaluated by [facility] 08-14-11    PAST SURGICAL HISTORY: Past Surgical History  Procedure Date  . Child birth 08-13-11    x3 -NVD  . Portacath placement 08/15/2011    Procedure: INSERTION PORT-A-CATH;  Surgeon: Shelly Rubenstein, MD;  Location: [facility];  Service: General;  Laterality: N/A;    FAMILY HISTORY Family History  Problem Relation Age of Onset  . Heart disease Mother   . Heart attack Father   The patient's father died from a heart attack at the age of 44. The patient's mother died from apparently heart problems at the age of 41. The patient had no brothers. She has 3 sisters. One of her sisters was diagnosed with breast cancer in her mid 71s. The patient does not know if his sister was ever genetically tested. The patient's mother had a mastectomy at the age of  30, presumably for breast cancer. There is no other history of breast or in cancer in the family to her knowledge.   GYNECOLOGIC HISTORY:  She does not recall when she had menarche. She had her first child at age 69. She is GX P3. She underwent menopause in her mid 30s. She never took hormone replacement.   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 She moved to [place] about 2 years ago but has a Museum/gallery curator in [place]. Son Thayer Ohm lives in [place] and works as a IT sales professional. His wife is a Engineer, civil (consulting). Son Onalee Hua lives at [employer] and is a Art therapist in addition to having the [employer]. The patient attends a local [employer] here   ADVANCED DIRECTIVES: Not in place  HEALTH MAINTENANCE: History  Substance Use Topics  . Smoking status: Never Smoker   . Smokeless tobacco: Never Used  . Alcohol Use: Yes     rare- occ.     Colonoscopy: Never  PAP: Feb 2013  Bone density: Never  Lipid panel:  Allergies  Allergen Reactions  . Ace Inhibitors Cough    Current Outpatient Prescriptions  Medication Sig Dispense Refill  . Alum & Mag Hydroxide-Simeth (MAGIC MOUTHWASH W/LIDOCAINE) SOLN Take 5 mLs by mouth 4 (four) times daily as needed.  300 mL  2  . carvedilol (COREG) 3.125 MG tablet Take 1 tablet (3.125 mg total) by mouth 2 (two) times daily with a meal.  60 tablet  3  . ciprofloxacin (CIPRO) 500 MG tablet Take 1 tablet (500 mg total) by mouth 2 (two) times daily.  14 tablet  3  . Multiple Vitamin (MULTIVITAMIN) capsule Take 1 capsule by mouth daily. States only daily-will stop today.      [name] omeprazole (PRILOSEC) 40 MG capsule Take 1 capsule (40 mg total) by mouth daily.  30 capsule  12  . potassium chloride SA (K-DUR,KLOR-CON) 20 MEQ tablet Take 1 tablet (20 mEq total) by mouth daily.  30 tablet  6  . prochlorperazine (COMPAZINE) 10 MG tablet Take 10 mg by mouth every 6 (six) hours as needed.        OBJECTIVE: [name] in no acute distress Filed Vitals:   11/04/11 0943  BP: 119/79  Pulse: 98  Temp: 98.4 F (36.9 C)     Body mass index is 40.20 kg/(m^2).    ECOG FS: 1  Physical Exam: HEENT:  Sclerae anicteric, conjunctivae pink.  Oropharynx  clear.  No mucositis or candidiasis.   Nodes:  There is no axillary adenopathy noted, right or left Breast Exam: Right breast, no masses palpated, no skin changes. In the left breast there persists a 4 x 2 cm very irregular pink area of skin involvement centered on the a real. I do not palpate a mass subjacent to this.  Extremities:  Benign.  No peripheral edema or cyanosis.   Neuro:  Nonfocal. Alert and oriented x3.   LAB RESULTS: Lab Results  Component Value Date   WBC 34.6* 11/04/2011   NEUTROABS 31.3* 11/04/2011   HGB 10.0* 11/04/2011   HCT 30.9* 11/04/2011   MCV 90.0 11/04/2011   PLT 272 11/04/2011      Chemistry      Component Value Date/Time   NA 137 10/20/2011 1315   K  4.2 10/20/2011 1315   CL 100 10/20/2011 1315   CO2 27 10/20/2011 1315   BUN 4* 10/20/2011 1315   CREATININE 0.60 10/20/2011 1315      Component Value Date/Time   CALCIUM 9.1 10/20/2011 1315   ALKPHOS 58 10/20/2011 1315   AST 25 10/20/2011 1315   ALT 28 10/20/2011 1315   BILITOT 0.3 10/20/2011 1315       Lab Results  Component Value Date   LABCA2 87* 08/06/2011    STUDIES:  10/10/2011 BILATERAL BREAST MRI WITH AND WITHOUT CONTRAST  Technique: Multiplanar, multisequence MR images of both breasts  were obtained prior to and following the intravenous administration  of 20ml of Multihance. Three dimensional images were evaluated at  the independent DynaCad workstation.  Comparison: Breast MRI 08/05/2011  Findings: Enhancement of the markedly irregular subareolar left  breast mass with associated distortion and nipple retraction has  significantly decreased since the breast MRI of 08/05/2011. Some  of the central aspect of the dominant subareolar mass shows no  enhancement on the current exam. Although the margins are very ill-  defined, the dominant subareolar left breast mass measures  approximately 5.0 x 3.6 x 3.8 cm (previously 6.9 x 6.6 x 6.9 cm).  Biopsy clip artifact is seen within the mass. Similarly, the    previously described scattered subareolar nodules involving all  quadrants of the left breast have significantly decreased in  enhancement and size. Intensity of enhancement of the nodule  within the medial left pectoralis muscle has also decreased. Skin  thickening of the left breast persists.  Level I and level II left axillary lymphadenopathy has markedly  resolved. The largest lymph node seen on today's study is a level  I lymph node that measures 0.8 cm short axis; this node has a  normal thin cortex and normal fatty hilum.  Biopsy-proven malignancy in the lateral right breast shows a lower  level of heterogeneous enhancement on today's examination and  measures 1.4 x 1.3 x 1.4 cm (previously 2.1 x 2.0 x 2.2 cm).  Central biopsy clip artifact is present.  No right axillary or internal mammary chain lymphadenopathy is  identified.  IMPRESSION:  1. Significant positive response to neoadjuvant therapy. The  dominant mass and multiple smaller nodules in the left breast have  significantly decreased in enhancement as well as size. Subareolar  architecture distortion and skin thickening are not significantly  changed.  2. Level I and level II left axillary lymphadenopathy has  resolved.  3. Decrease in size and enhancement of lateral right breast  invasive ductal carcinoma.   ASSESSMENT: [name]   (1)  status post bilateral breast biopsies 07/24/2011, showing, on the right, a clinical T2 N0 papillary/ductal breast cancer, estrogen and progesterone receptor positive, HER-2 negative, with an MIB-1 of 10%; on the left, a T3 N1 invasive ductal carcinoma, grade 3, triple positive, with an MIB-1 of 60%.  (2)  Status post 4 dose dense cycles of doxorubicin/cyclophosphamide, to be followed by 4 dose dense cycles of paclitaxel and trastuzumab, the trastuzumab then continued for a total of one year.  (3)  History of febrile chemo-induced neutropenia requiring  hospitalization following fourth cycle of AC  (3)  the patient will need postmastectomy radiation at least on the left.  PLAN: Most recent echo was 08/20/2011, and that will need to be repeated in June. She is doing very well with the of paclitaxel/trastuzumab, and is a ready scheduled for her second cycle next week. Once  she completes all 4 cycles, she will proceed directly to bilateral mastectomies. Reconstruction will have to be delayed because she will need radiation on the left side postmastectomy. She knows to call for any problems that may develop before the next visit.   Regene Mccarthy C    11/04/2011

## 2011-11-05 ENCOUNTER — Ambulatory Visit: Payer: Medicare Other

## 2011-11-11 ENCOUNTER — Other Ambulatory Visit: Payer: Medicare Other | Admitting: Lab

## 2011-11-11 ENCOUNTER — Ambulatory Visit: Payer: Medicare Other

## 2011-11-12 ENCOUNTER — Other Ambulatory Visit: Payer: Medicare Other | Admitting: Lab

## 2011-11-12 ENCOUNTER — Ambulatory Visit: Payer: Medicare Other

## 2011-11-12 ENCOUNTER — Ambulatory Visit: Payer: Medicare Other | Admitting: Physician Assistant

## 2011-11-12 ENCOUNTER — Ambulatory Visit (HOSPITAL_BASED_OUTPATIENT_CLINIC_OR_DEPARTMENT_OTHER): Payer: Medicare Other

## 2011-11-12 ENCOUNTER — Other Ambulatory Visit (HOSPITAL_BASED_OUTPATIENT_CLINIC_OR_DEPARTMENT_OTHER): Payer: Medicare Other

## 2011-11-12 ENCOUNTER — Other Ambulatory Visit: Payer: Self-pay | Admitting: Medical Oncology

## 2011-11-12 VITALS — BP 152/79 | HR 95 | Temp 97.5°F

## 2011-11-12 DIAGNOSIS — C50119 Malignant neoplasm of central portion of unspecified female breast: Secondary | ICD-10-CM

## 2011-11-12 DIAGNOSIS — Z5111 Encounter for antineoplastic chemotherapy: Secondary | ICD-10-CM

## 2011-11-12 DIAGNOSIS — C50919 Malignant neoplasm of unspecified site of unspecified female breast: Secondary | ICD-10-CM

## 2011-11-12 LAB — CBC WITH DIFFERENTIAL/PLATELET
Basophils Absolute: 0 10*3/uL (ref 0.0–0.1)
Eosinophils Absolute: 0.2 10*3/uL (ref 0.0–0.5)
HGB: 9.7 g/dL — ABNORMAL LOW (ref 11.6–15.9)
MONO#: 0.9 10*3/uL (ref 0.1–0.9)
MONO%: 14.3 % — ABNORMAL HIGH (ref 0.0–14.0)
NEUT#: 4.4 10*3/uL (ref 1.5–6.5)
RBC: 3.25 10*6/uL — ABNORMAL LOW (ref 3.70–5.45)
RDW: 18.5 % — ABNORMAL HIGH (ref 11.2–14.5)
WBC: 6.3 10*3/uL (ref 3.9–10.3)
lymph#: 0.8 10*3/uL — ABNORMAL LOW (ref 0.9–3.3)

## 2011-11-12 LAB — COMPREHENSIVE METABOLIC PANEL
Albumin: 3.7 g/dL (ref 3.5–5.2)
Alkaline Phosphatase: 85 U/L (ref 39–117)
CO2: 26 mEq/L (ref 19–32)
Calcium: 9.2 mg/dL (ref 8.4–10.5)
Chloride: 104 mEq/L (ref 96–112)
Glucose, Bld: 116 mg/dL — ABNORMAL HIGH (ref 70–99)
Potassium: 4.2 mEq/L (ref 3.5–5.3)
Sodium: 141 mEq/L (ref 135–145)
Total Protein: 5.8 g/dL — ABNORMAL LOW (ref 6.0–8.3)

## 2011-11-12 MED ORDER — DIPHENHYDRAMINE HCL 50 MG/ML IJ SOLN
50.0000 mg | Freq: Once | INTRAMUSCULAR | Status: AC
  Administered 2011-11-12: 50 mg via INTRAVENOUS

## 2011-11-12 MED ORDER — SODIUM CHLORIDE 0.9 % IJ SOLN
10.0000 mL | INTRAMUSCULAR | Status: DC | PRN
  Administered 2011-11-12: 10 mL
  Filled 2011-11-12: qty 10

## 2011-11-12 MED ORDER — ACETAMINOPHEN 325 MG PO TABS
650.0000 mg | ORAL_TABLET | Freq: Once | ORAL | Status: AC
  Administered 2011-11-12: 650 mg via ORAL

## 2011-11-12 MED ORDER — SODIUM CHLORIDE 0.9 % IV SOLN
Freq: Once | INTRAVENOUS | Status: AC
  Administered 2011-11-12: 10:00:00 via INTRAVENOUS

## 2011-11-12 MED ORDER — FAMOTIDINE IN NACL 20-0.9 MG/50ML-% IV SOLN
20.0000 mg | Freq: Once | INTRAVENOUS | Status: AC
  Administered 2011-11-12: 20 mg via INTRAVENOUS

## 2011-11-12 MED ORDER — PROCHLORPERAZINE MALEATE 10 MG PO TABS: 10.0000 mg | ORAL_TABLET | Freq: Four times a day (QID) | ORAL | Status: DC | PRN

## 2011-11-12 MED ORDER — PACLITAXEL CHEMO INJECTION 300 MG/50ML
175.0000 mg/m2 | Freq: Once | INTRAVENOUS | Status: AC
  Administered 2011-11-12: 420 mg via INTRAVENOUS
  Filled 2011-11-12: qty 70

## 2011-11-12 MED ORDER — TRASTUZUMAB CHEMO INJECTION 440 MG
4.0000 mg/kg | Freq: Once | INTRAVENOUS | Status: AC
  Administered 2011-11-12: 483 mg via INTRAVENOUS
  Filled 2011-11-12: qty 23

## 2011-11-12 MED ORDER — DIPHENHYDRAMINE HCL 25 MG PO CAPS
50.0000 mg | ORAL_CAPSULE | Freq: Once | ORAL | Status: AC
  Administered 2011-11-12: 50 mg via ORAL

## 2011-11-12 MED ORDER — DEXAMETHASONE SODIUM PHOSPHATE 4 MG/ML IJ SOLN
20.0000 mg | Freq: Once | INTRAMUSCULAR | Status: AC
  Administered 2011-11-12: 20 mg via INTRAVENOUS

## 2011-11-12 MED ORDER — HEPARIN SOD (PORK) LOCK FLUSH 100 UNIT/ML IV SOLN
500.0000 [IU] | Freq: Once | INTRAVENOUS | Status: AC | PRN
  Administered 2011-11-12: 500 [IU]
  Filled 2011-11-12: qty 5

## 2011-11-12 MED ORDER — ONDANSETRON 8 MG/50ML IVPB (CHCC)
8.0000 mg | Freq: Once | INTRAVENOUS | Status: AC
  Administered 2011-11-12: 8 mg via INTRAVENOUS

## 2011-11-12 MED ORDER — CIPROFLOXACIN HCL 500 MG PO TABS: 500.0000 mg | ORAL_TABLET | Freq: Two times a day (BID) | ORAL | Status: DC

## 2011-11-12 NOTE — Telephone Encounter (Signed)
Pt notified that refills called in for compazine and cipro

## 2011-11-12 NOTE — Patient Instructions (Signed)
[facility] Discharge Instructions for Patients Receiving Chemotherapy  Today you received the following chemotherapy agents Taxol,herceptin To help prevent nausea and vomiting after your treatment, we encourage you to take your nausea medication   Take it as often as prescribed.   If you develop nausea and vomiting that is not controlled by your nausea medication, call the clinic. If it is after clinic hours your family physician or the after hours number for the clinic or go to the Emergency Department.   BELOW ARE SYMPTOMS THAT SHOULD BE REPORTED IMMEDIATELY:  *FEVER GREATER THAN 100.5 F  *CHILLS WITH OR WITHOUT FEVER  NAUSEA AND VOMITING THAT IS NOT CONTROLLED WITH YOUR NAUSEA MEDICATION  *UNUSUAL SHORTNESS OF BREATH  *UNUSUAL BRUISING OR BLEEDING  TENDERNESS IN MOUTH AND THROAT WITH OR WITHOUT PRESENCE OF ULCERS  *URINARY PROBLEMS  *BOWEL PROBLEMS  UNUSUAL RASH Items with * indicate a potential emergency and should be followed up as soon as possible.  If this is your first treatment one of the nurses will contact you 24 hours after your treatment. Please let the nurse know about any problems that you may have experienced. Feel free to call the clinic you have any questions or concerns. The clinic phone number is [phone].   I have been informed and understand all the instructions given to me. I know to contact the clinic, my physician, or go to the Emergency Department if any problems should occur. I do not have any questions at this time, but understand that I may call the clinic during office hours   should I have any questions or need assistance in obtaining follow up care.    __________________________________________  _____________  __________ Signature of Patient or Authorized Representative            Date                   Time    __________________________________________ Nurse's Signature

## 2011-11-13 ENCOUNTER — Ambulatory Visit (HOSPITAL_BASED_OUTPATIENT_CLINIC_OR_DEPARTMENT_OTHER): Payer: Medicare Other

## 2011-11-13 VITALS — BP 145/72 | HR 94 | Temp 97.1°F

## 2011-11-13 DIAGNOSIS — C50119 Malignant neoplasm of central portion of unspecified female breast: Secondary | ICD-10-CM

## 2011-11-13 MED ORDER — PEGFILGRASTIM INJECTION 6 MG/0.6ML
6.0000 mg | Freq: Once | SUBCUTANEOUS | Status: AC
  Administered 2011-11-13: 6 mg via SUBCUTANEOUS
  Filled 2011-11-13: qty 0.6

## 2011-11-18 ENCOUNTER — Telehealth: Payer: Self-pay | Admitting: *Deleted

## 2011-11-18 ENCOUNTER — Ambulatory Visit (HOSPITAL_BASED_OUTPATIENT_CLINIC_OR_DEPARTMENT_OTHER): Payer: Medicare Other | Admitting: Physician Assistant

## 2011-11-18 ENCOUNTER — Encounter: Payer: Self-pay | Admitting: Physician Assistant

## 2011-11-18 ENCOUNTER — Other Ambulatory Visit (HOSPITAL_BASED_OUTPATIENT_CLINIC_OR_DEPARTMENT_OTHER): Payer: Medicare Other | Admitting: Lab

## 2011-11-18 VITALS — BP 142/80 | HR 105 | Temp 99.2°F | Ht 68.0 in | Wt 269.9 lb

## 2011-11-18 DIAGNOSIS — Z170 Estrogen receptor positive status [ER+]: Secondary | ICD-10-CM

## 2011-11-18 DIAGNOSIS — C50119 Malignant neoplasm of central portion of unspecified female breast: Secondary | ICD-10-CM

## 2011-11-18 LAB — CBC WITH DIFFERENTIAL/PLATELET
EOS%: 2.2 % (ref 0.0–7.0)
MCH: 29.8 pg (ref 25.1–34.0)
MCV: 91.7 fL (ref 79.5–101.0)
MONO%: 3.8 % (ref 0.0–14.0)
NEUT#: 12.7 10*3/uL — ABNORMAL HIGH (ref 1.5–6.5)
RBC: 3.02 10*6/uL — ABNORMAL LOW (ref 3.70–5.45)
RDW: 19.2 % — ABNORMAL HIGH (ref 11.2–14.5)
nRBC: 0 % (ref 0–0)

## 2011-11-18 NOTE — Telephone Encounter (Signed)
gave patient appointment for 12-09-2011 and 12-10-2011 lab md treatment injection on 12-10-2011 at 9:15am printed out  calendar and gave to the patient emailed michelle to add chemo for the 12-09-2011

## 2011-11-18 NOTE — Progress Notes (Signed)
ID: Linda [name]   DOB: 1946-03-12  MR#: [id number]  NWG#:[id number]  HISTORY OF PRESENT ILLNESS: The patient noted a small amount of drainage from her left breast December of 2012. She brought it to her gynecologist's attention in January of 2013 and was set up for bilateral diagnostic mammography at the breast Center July 24, 2011. This was the patient's first ever mammogram. It showed a large irregular mass in the left retroareolar region extending to the nipple, with nipple retraction and skin thickening. This measured approximately 8.4 cm. It was associated with pleomorphic microcalcifications. By exam there was moderate distortion and retraction of the nipple with a large palpable ill-defined area in the retroareolar region. In the right right breast there was also a 2 cm hard mass palpated. Ultrasound of the right breast mass showed a complex cystic/solid area measuring 1.9 cm. Ultrasound of the right axilla was negative. Ultrasound of the left breast showed a large hypoechoic mass measuring at least 3.8 cm. The left axilla showed several adjacent abnormal appearing lymph nodes.  With this information biopsy of the right and left breast masses were obtained the same day, and showed ([id number])  (a) on the right, and invasive ductal carcinoma with papillary features, estrogen and progesterone receptor positive, HER-2 negative, with an MIB-1 of 10%.  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 The MIB-1 was 60% for the left-sided tumor.  With this information the patient was presented at the multidisciplinary breast cancer conference 08/06/2011. Subsequent evaluation and treatments are as detailed below.  INTERVAL HISTORY: [name] returns today for follow up of her bilateral breast carcinomas. Today is day 8 cycle 2 of 4 planned dose dense cycles of paclitaxel with trastuzumab. She receives  Neulasta on day 2 for granulocyte support. She's tolerated the first 2 cycles extremely well with only a few concerns today.  [name] shows me that the toenail on her left great toe is loosening from the nailbed. She denies any pain or drainage. She has noticed some increased peripheral neuropathy. Specifically, she has had tingling bilaterally in her feet and ankles. She tells me it comes and goes, and is slightly worse at night. She does, however, sometimes still a little unsteady on her feet when she first stands up. She's had only some brief tingling in the fingertips, but nothing significant thus far.   REVIEW OF SYSTEMS: She has had no fever, bleeding, or rash. She occasionally has some mild rectal bleeding following treatment, associated with mild constipation. Once the constipation resolves, so does the rectal bleeding. She has had no nausea or vomiting. No mouth sores or mouth changes. Her cough has resolved. She does have some shortness of breath with exertion. No chest pain. No abnormal headaches.  A detailed review of systems was otherwise noncontributory.  PAST MEDICAL HISTORY: Past Medical History  Diagnosis Date  . Cancer 08-13-11    07-31-11-Dx. Bilarteral Breast cancer-left greater than rt.  . Hematuria, undiagnosed cause 08-13-11    Being evaluated by [facility] 08-14-11    PAST SURGICAL HISTORY: Past Surgical History  Procedure Date  . Child birth 08-13-11    x3 -NVD  . Portacath placement 08/15/2011    Procedure: INSERTION PORT-A-CATH;  Surgeon: Shelly Rubenstein, MD;  Location: [facility];  Service: General;  Laterality: N/A;    FAMILY HISTORY Family History  Problem Relation Age of Onset  . Heart disease Mother   .  Heart attack Father   The patient's father died from a heart attack at the age of 58. The patient's mother died from apparently heart problems at the age of 63. The patient had no brothers. She has 3 sisters. One of her sisters was diagnosed with breast cancer  in her mid 48s. The patient does not know if his sister was ever genetically tested. The patient's mother had a mastectomy at the age of 68, presumably for breast cancer. There is no other history of breast or in cancer in the family to her knowledge.   GYNECOLOGIC HISTORY:  She does not recall when she had menarche. She had her first child at age 31. She is GX P3. She underwent menopause in her mid 65s. She never took hormone replacement.   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 She moved to [place] about 2 years ago but has a Museum/gallery curator in [place]. Son Thayer Ohm lives in [place] and works as a IT sales professional. His wife is a Engineer, civil (consulting). Son Onalee Hua lives at [employer] and is a Art therapist in addition to having the [employer]. The patient attends a local [employer] here   ADVANCED DIRECTIVES: Not in place  HEALTH MAINTENANCE: History  Substance Use Topics  . Smoking status: Never Smoker   . Smokeless tobacco: Never Used  . Alcohol Use: Yes     rare- occ.     Colonoscopy: Never  PAP: Feb 2013  Bone density: Never  Lipid panel:  Allergies  Allergen Reactions  . Ace Inhibitors Cough    Current Outpatient Prescriptions  Medication Sig Dispense Refill  . b complex vitamins tablet Take 1 tablet by mouth daily.      . carvedilol (COREG) 3.125 MG tablet Take 1 tablet (3.125 mg total) by mouth 2 (two) times daily with a meal.  60 tablet  3  . dexamethasone (DECADRON) 4 MG tablet       . Multiple Vitamin (MULTIVITAMIN) capsule Take 1 capsule by mouth daily. States only daily-will stop today.      [name] omeprazole (PRILOSEC) 40 MG capsule Take 1 capsule (40 mg total) by mouth daily.  30 capsule  12  . potassium chloride SA (K-DUR,KLOR-CON) 20 MEQ tablet Take 1 tablet (20 mEq total) by mouth daily.  30 tablet  6   . prochlorperazine (COMPAZINE) 10 MG tablet Take 1 tablet (10 mg total) by mouth every 6 (six) hours as needed.  30 tablet  2  . Alum & Mag Hydroxide-Simeth (MAGIC MOUTHWASH W/LIDOCAINE) SOLN Take 5 mLs by mouth 4 (four) times daily as needed.  300 mL  2  . ciprofloxacin (CIPRO) 500 MG tablet Take 1 tablet (500 mg total) by mouth 2 (two) times daily.  14 tablet  3    OBJECTIVE: [name] in no acute distress Filed Vitals:   11/18/11 0912  BP: 142/80  Pulse: 105  Temp: 99.2 F (37.3 C)     Body mass index is 41.04 kg/(m^2).    ECOG FS: 1  Filed Weights   11/18/11 0912  Weight: 269 lb 14.4 oz (122.426 kg)   Physical Exam: HEENT:  Sclerae anicteric, conjunctivae pink.  Oropharynx clear.  No mucositis or candidiasis.   Nodes:  No cervical, supraclavicular, or axillary lymphadenopathy palpated.  Breast Exam:  Deferred Lungs:  Clear to auscultation bilaterally.  No crackles, rhonchi, or wheezes.   Heart:  Regular rate and rhythm.   Abdomen:  Soft, obese, nontender.  Positive bowel sounds.  No organomegaly or masses palpated.   Musculoskeletal:  No focal spinal tenderness to palpation.  Extremities:  Benign.  No peripheral edema or cyanosis.   Skin:  The nail on the left great toe is loosening slightly from the nailbed, but with no erythema, drainage, swelling, or evidence of infection. Skin is otherwise benign.   Neuro:  Nonfocal. Alert and oriented x3.    LAB RESULTS: Lab Results  Component Value Date   WBC 14.5* 11/18/2011   NEUTROABS 12.7* 11/18/2011   HGB 9.0* 11/18/2011   HCT 27.7* 11/18/2011   MCV 91.7 11/18/2011   PLT 121* 11/18/2011      Chemistry      Component Value Date/Time   NA 141 11/12/2011 0923   K 4.2 11/12/2011 0923   CL 104 11/12/2011 0923   CO2 26 11/12/2011 0923   BUN 10 11/12/2011 0923   CREATININE 0.56 11/12/2011 0923      Component Value Date/Time   CALCIUM 9.2 11/12/2011 0923   ALKPHOS 85 11/12/2011 0923   AST 17 11/12/2011 0923   ALT 19  11/12/2011 0923   BILITOT 0.5 11/12/2011 0923       Lab Results  Component Value Date   LABCA2 87* 08/06/2011    STUDIES:  10/10/2011 BILATERAL BREAST MRI WITH AND WITHOUT CONTRAST  Technique: Multiplanar, multisequence MR images of both breasts  were obtained prior to and following the intravenous administration  of 20ml of Multihance. Three dimensional images were evaluated at  the independent DynaCad workstation.  Comparison: Breast MRI 08/05/2011  Findings: Enhancement of the markedly irregular subareolar left  breast mass with associated distortion and nipple retraction has  significantly decreased since the breast MRI of 08/05/2011. Some  of the central aspect of the dominant subareolar mass shows no  enhancement on the current exam. Although the margins are very ill-  defined, the dominant subareolar left breast mass measures  approximately 5.0 x 3.6 x 3.8 cm (previously 6.9 x 6.6 x 6.9 cm).  Biopsy clip artifact is seen within the mass. Similarly, the  previously described scattered subareolar nodules involving all  quadrants of the left breast have significantly decreased in  enhancement and size. Intensity of enhancement of the nodule  within the medial left pectoralis muscle has also decreased. Skin  thickening of the left breast persists.  Level I and level II left axillary lymphadenopathy has markedly  resolved. The largest lymph node seen on today's study is a level  I lymph node that measures 0.8 cm short axis; this node has a  normal thin cortex and normal fatty hilum.  Biopsy-proven malignancy in the lateral right breast shows a lower  level of heterogeneous enhancement on today's examination and  measures 1.4 x 1.3 x 1.4 cm (previously 2.1 x 2.0 x 2.2 cm).  Central biopsy clip artifact is present.  No right axillary or internal mammary chain lymphadenopathy is  identified.  IMPRESSION:  1. Significant positive response to neoadjuvant therapy. The  dominant  mass and multiple smaller nodules in the left breast have  significantly decreased in enhancement as well as size. Subareolar  architecture distortion and skin thickening are not significantly  changed.  2. Level I and level II left axillary lymphadenopathy has  resolved.  3. Decrease in size and enhancement of lateral right  breast  invasive ductal carcinoma.   ASSESSMENT: [name]   (1)  status post bilateral breast biopsies 07/24/2011, showing, on the right, a clinical T2 N0 papillary/ductal breast cancer, estrogen and progesterone receptor positive, HER-2 negative, with an MIB-1 of 10%; on the left, a T3 N1 invasive ductal carcinoma, grade 3, triple positive, with an MIB-1 of 60%.  (2)  Status post 4 dose dense cycles of doxorubicin/cyclophosphamide, to be followed by 4 dose dense cycles of paclitaxel and trastuzumab, the trastuzumab then continued for a total of one year.  (3)  History of febrile chemo-induced neutropenia requiring hospitalization following fourth cycle of AC  (3)  the patient will need postmastectomy radiation at least on the left.  PLAN: Appears to be tolerating the paclitaxel/trastuzumab well, and will return in one week for followup prior to her third cycle. She is already scheduled out through her 4 cycles in late June. She is scheduled for her echocardiogram on June 20, and will followup with her surgeon on July 5 after completion of chemotherapy.  She will proceed directly to bilateral mastectomies. Reconstruction will have to be delayed because she will need radiation on the left side postmastectomy.   She knows to call for any problems that may develop before the next visit.   Linda [name]    11/18/2011

## 2011-11-18 NOTE — Telephone Encounter (Signed)
Er e-mail from [place], I h ave scheduled treatment appts.   JMW

## 2011-11-25 ENCOUNTER — Ambulatory Visit (HOSPITAL_BASED_OUTPATIENT_CLINIC_OR_DEPARTMENT_OTHER): Payer: Medicare Other | Admitting: Physician Assistant

## 2011-11-25 ENCOUNTER — Encounter: Payer: Self-pay | Admitting: Physician Assistant

## 2011-11-25 ENCOUNTER — Other Ambulatory Visit (HOSPITAL_BASED_OUTPATIENT_CLINIC_OR_DEPARTMENT_OTHER): Payer: Medicare Other | Admitting: Lab

## 2011-11-25 ENCOUNTER — Ambulatory Visit (HOSPITAL_BASED_OUTPATIENT_CLINIC_OR_DEPARTMENT_OTHER): Payer: Medicare Other

## 2011-11-25 VITALS — BP 128/78 | HR 96 | Temp 98.2°F | Ht 68.0 in | Wt 273.6 lb

## 2011-11-25 DIAGNOSIS — C773 Secondary and unspecified malignant neoplasm of axilla and upper limb lymph nodes: Secondary | ICD-10-CM

## 2011-11-25 DIAGNOSIS — C50019 Malignant neoplasm of nipple and areola, unspecified female breast: Secondary | ICD-10-CM

## 2011-11-25 DIAGNOSIS — C50519 Malignant neoplasm of lower-outer quadrant of unspecified female breast: Secondary | ICD-10-CM

## 2011-11-25 DIAGNOSIS — Z5112 Encounter for antineoplastic immunotherapy: Secondary | ICD-10-CM

## 2011-11-25 DIAGNOSIS — C50119 Malignant neoplasm of central portion of unspecified female breast: Secondary | ICD-10-CM

## 2011-11-25 DIAGNOSIS — Z5111 Encounter for antineoplastic chemotherapy: Secondary | ICD-10-CM

## 2011-11-25 LAB — COMPREHENSIVE METABOLIC PANEL
ALT: 16 U/L (ref 0–35)
AST: 15 U/L (ref 0–37)
Calcium: 9.3 mg/dL (ref 8.4–10.5)
Chloride: 103 mEq/L (ref 96–112)
Creatinine, Ser: 0.52 mg/dL (ref 0.50–1.10)
Potassium: 4.1 mEq/L (ref 3.5–5.3)

## 2011-11-25 LAB — CBC WITH DIFFERENTIAL/PLATELET
BASO%: 0.3 % (ref 0.0–2.0)
MCHC: 31.7 g/dL (ref 31.5–36.0)
MONO#: 0.6 10*3/uL (ref 0.1–0.9)
RBC: 2.99 10*6/uL — ABNORMAL LOW (ref 3.70–5.45)
WBC: 7.1 10*3/uL (ref 3.9–10.3)
lymph#: 0.9 10*3/uL (ref 0.9–3.3)
nRBC: 0 % (ref 0–0)

## 2011-11-25 MED ORDER — DIPHENHYDRAMINE HCL 25 MG PO CAPS: 50.0000 mg | ORAL_CAPSULE | Freq: Once | ORAL | Status: DC

## 2011-11-25 MED ORDER — DEXAMETHASONE SODIUM PHOSPHATE 4 MG/ML IJ SOLN
20.0000 mg | Freq: Once | INTRAMUSCULAR | Status: AC
  Administered 2011-11-25: 20 mg via INTRAVENOUS

## 2011-11-25 MED ORDER — DIPHENHYDRAMINE HCL 50 MG/ML IJ SOLN
50.0000 mg | Freq: Once | INTRAMUSCULAR | Status: AC
  Administered 2011-11-25: 50 mg via INTRAVENOUS

## 2011-11-25 MED ORDER — PACLITAXEL CHEMO INJECTION 300 MG/50ML
175.0000 mg/m2 | Freq: Once | INTRAVENOUS | Status: AC
  Administered 2011-11-25: 420 mg via INTRAVENOUS
  Filled 2011-11-25: qty 70

## 2011-11-25 MED ORDER — ACETAMINOPHEN 325 MG PO TABS
650.0000 mg | ORAL_TABLET | Freq: Once | ORAL | Status: AC
  Administered 2011-11-25: 650 mg via ORAL

## 2011-11-25 MED ORDER — HEPARIN SOD (PORK) LOCK FLUSH 100 UNIT/ML IV SOLN
500.0000 [IU] | Freq: Once | INTRAVENOUS | Status: AC | PRN
  Administered 2011-11-25: 500 [IU]
  Filled 2011-11-25: qty 5

## 2011-11-25 MED ORDER — TRASTUZUMAB CHEMO INJECTION 440 MG
4.0000 mg/kg | Freq: Once | INTRAVENOUS | Status: AC
  Administered 2011-11-25: 483 mg via INTRAVENOUS
  Filled 2011-11-25: qty 23

## 2011-11-25 MED ORDER — FAMOTIDINE IN NACL 20-0.9 MG/50ML-% IV SOLN
20.0000 mg | Freq: Once | INTRAVENOUS | Status: AC
  Administered 2011-11-25: 20 mg via INTRAVENOUS

## 2011-11-25 MED ORDER — SODIUM CHLORIDE 0.9 % IJ SOLN
10.0000 mL | INTRAMUSCULAR | Status: DC | PRN
  Administered 2011-11-25: 10 mL
  Filled 2011-11-25: qty 10

## 2011-11-25 MED ORDER — SODIUM CHLORIDE 0.9 % IV SOLN
Freq: Once | INTRAVENOUS | Status: AC
  Administered 2011-11-25: 11:00:00 via INTRAVENOUS

## 2011-11-25 MED ORDER — ONDANSETRON 8 MG/50ML IVPB (CHCC)
8.0000 mg | Freq: Once | INTRAVENOUS | Status: AC
  Administered 2011-11-25: 8 mg via INTRAVENOUS

## 2011-11-25 NOTE — Progress Notes (Signed)
ID: CARLIYAH COTTERMAN   DOB: July 10, 1945  MR#: [id number]  JYN#:[id number]  HISTORY OF PRESENT ILLNESS: The patient noted a small amount of drainage from her left breast December of 2012. She brought it to her gynecologist's attention in January of 2013 and was set up for bilateral diagnostic mammography at the breast Center July 24, 2011. This was the patient's first ever mammogram. It showed a large irregular mass in the left retroareolar region extending to the nipple, with nipple retraction and skin thickening. This measured approximately 8.4 cm. It was associated with pleomorphic microcalcifications. By exam there was moderate distortion and retraction of the nipple with a large palpable ill-defined area in the retroareolar region. In the right right breast there was also a 2 cm hard mass palpated. Ultrasound of the right breast mass showed a complex cystic/solid area measuring 1.9 cm. Ultrasound of the right axilla was negative. Ultrasound of the left breast showed a large hypoechoic mass measuring at least 3.8 cm. The left axilla showed several adjacent abnormal appearing lymph nodes.  With this information biopsy of the right and left breast masses were obtained the same day, and showed ([id number])  (a) on the right, and invasive ductal carcinoma with papillary features, estrogen and progesterone receptor positive, HER-2 negative, with an MIB-1 of 10%.  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 The MIB-1 was 60% for the left-sided tumor.  With this information the patient was presented at the multidisciplinary breast cancer conference 08/06/2011. Subsequent evaluation and treatments are as detailed below.  INTERVAL HISTORY: [name] returns today for follow up of her bilateral breast carcinomas. She is due for day 1 cycle 3 of 4 planned dose dense cycles of paclitaxel with trastuzumab. She receives  Neulasta on day 2 for granulocyte support. She's tolerated the first 2 cycles extremely well with only a few concerns today.  Pat's only real complaint today is what she describes as a feeling of "heaviness" in her feet bilaterally. She denies any actual numbness or tingling. She has had some swelling in the lower extremities, equal bilaterally. She denies any redness or any actual pain in the legs. She's had no problems walking, and has experienced no falls. She experienced very brief episode of tingling isolated to the fingertips last week but that resolved quickly, and she's had no additional signs of neuropathy.  She is taking a vitamin B complex daily.  REVIEW OF SYSTEMS: [name] has had no fever, bleeding, or rash. Her energy level is "great" today. No abnormal bleeding. She has had no nausea or vomiting, and no change in bowel habits.. No mouth sores or mouth changes. Her cough has resolved. She has some shortness of breath with exertion, but does not feel like that has increased.[name] No chest pain. No abnormal headaches or dizziness.  A detailed review of systems was otherwise noncontributory.  PAST MEDICAL HISTORY: Past Medical History  Diagnosis Date  . Cancer 08-13-11    07-31-11-Dx. Bilarteral Breast cancer-left greater than rt.  . Hematuria, undiagnosed cause 08-13-11    Being evaluated by [facility] 08-14-11    PAST SURGICAL HISTORY: Past Surgical History  Procedure Date  . Child birth 08-13-11    x3 -NVD  . Portacath placement 08/15/2011    Procedure: INSERTION PORT-A-CATH;  Surgeon: Shelly Rubenstein, MD;  Location: [facility];  Service: General;  Laterality: N/A;    FAMILY HISTORY Family History  Problem  Relation Age of Onset  . Heart disease Mother   . Heart attack Father   The patient's father died from a heart attack at the age of 23. The patient's mother died from apparently heart problems at the age of 25. The patient had no brothers. She has 3 sisters. One of her sisters  was diagnosed with breast cancer in her mid 56s. The patient does not know if his sister was ever genetically tested. The patient's mother had a mastectomy at the age of 108, presumably for breast cancer. There is no other history of breast or in cancer in the family to her knowledge.   GYNECOLOGIC HISTORY:  She does not recall when she had menarche. She had her first child at age 66. She is GX P3. She underwent menopause in her mid 56s. She never took hormone replacement.   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 She moved to [place] about 2 years ago but has a Museum/gallery curator in [place]. Son Thayer Ohm lives in [place] and works as a IT sales professional. His wife is a Engineer, civil (consulting). Son Onalee Hua lives at [employer] and is a Art therapist in addition to having the [employer]. The patient attends a local [employer] here   ADVANCED DIRECTIVES: Not in place  HEALTH MAINTENANCE: History  Substance Use Topics  . Smoking status: Never Smoker   . Smokeless tobacco: Never Used  . Alcohol Use: Yes     rare- occ.     Colonoscopy: Never  PAP: Feb 2013  Bone density: Never  Lipid panel:  Allergies  Allergen Reactions  . Ace Inhibitors Cough    Current Outpatient Prescriptions  Medication Sig Dispense Refill  . Alum & Mag Hydroxide-Simeth (MAGIC MOUTHWASH W/LIDOCAINE) SOLN Take 5 mLs by mouth 4 (four) times daily as needed.  300 mL  2  . b complex vitamins tablet Take 1 tablet by mouth daily.      . carvedilol (COREG) 3.125 MG tablet Take 1 tablet (3.125 mg total) by mouth 2 (two) times daily with a meal.  60 tablet  3  . ciprofloxacin (CIPRO) 500 MG tablet Take 1 tablet (500 mg total) by mouth 2 (two) times daily.  14 tablet  3  . dexamethasone (DECADRON) 4 MG tablet       . Multiple Vitamin (MULTIVITAMIN) capsule Take 1  capsule by mouth daily. States only daily-will stop today.      [name] omeprazole (PRILOSEC) 40 MG capsule Take 1 capsule (40 mg total) by mouth daily.  30 capsule  12  . potassium chloride SA (K-DUR,KLOR-CON) 20 MEQ tablet Take 1 tablet (20 mEq total) by mouth daily.  30 tablet  6  . prochlorperazine (COMPAZINE) 10 MG tablet Take 1 tablet (10 mg total) by mouth every 6 (six) hours as needed.  30 tablet  2    OBJECTIVE: [name] in no acute distress Filed Vitals:   11/25/11 1005  BP: 128/78  Pulse: 96  Temp: 98.2 F (36.8 C)     Body mass index is 41.60 kg/(m^2).    ECOG FS: 1  Filed Weights   11/25/11 1005  Weight: 273 lb 9.6 oz (124.104 kg)   Physical Exam: HEENT:  Sclerae anicteric, conjunctivae pink.  Oropharynx clear.  No mucositis or candidiasis.  Nodes:  No cervical, supraclavicular, or axillary lymphadenopathy palpated.  Breast Exam:  Right breast, unable to palpate a distinct mass. No obvious skin changes. Left breast, there is still an area of skin change, improving, more pink than red today, and encompassing the areolar complex. There is associated thickening of the tissue beneath this area. Lungs:  Clear to auscultation bilaterally.  No crackles, rhonchi, or wheezes.   Heart:  Regular rate and rhythm.   Abdomen:  Soft, obese, nontender.  Positive bowel sounds.  No organomegaly or masses palpated.   Musculoskeletal:  No focal spinal tenderness to palpation.  Extremities:  1+ pitting edema bilaterally in the lower extremities. Equal bilaterally with no erythema or palpable cords. No upper extremity edema. No peripheral cyanosis.   Skin:  Benign.   Neuro:  Nonfocal. Alert and oriented x3.    LAB RESULTS: Lab Results  Component Value Date   WBC 7.1 11/25/2011   NEUTROABS 5.4 11/25/2011   HGB 9.0* 11/25/2011   HCT 28.4* 11/25/2011   MCV 95.0 11/25/2011   PLT 148 11/25/2011      Chemistry      Component Value Date/Time   NA 141 11/12/2011 0923   K 4.2  11/12/2011 0923   CL 104 11/12/2011 0923   CO2 26 11/12/2011 0923   BUN 10 11/12/2011 0923   CREATININE 0.56 11/12/2011 0923      Component Value Date/Time   CALCIUM 9.2 11/12/2011 0923   ALKPHOS 85 11/12/2011 0923   AST 17 11/12/2011 0923   ALT 19 11/12/2011 0923   BILITOT 0.5 11/12/2011 0923       Lab Results  Component Value Date   LABCA2 87* 08/06/2011    STUDIES:  10/10/2011 BILATERAL BREAST MRI WITH AND WITHOUT CONTRAST  Technique: Multiplanar, multisequence MR images of both breasts  were obtained prior to and following the intravenous administration  of 20ml of Multihance. Three dimensional images were evaluated at  the independent DynaCad workstation.  Comparison: Breast MRI 08/05/2011  Findings: Enhancement of the markedly irregular subareolar left  breast mass with associated distortion and nipple retraction has  significantly decreased since the breast MRI of 08/05/2011. Some  of the central aspect of the dominant subareolar mass shows no  enhancement on the current exam. Although the margins are very ill-  defined, the dominant subareolar left breast mass measures  approximately 5.0 x 3.6 x 3.8 cm (previously 6.9 x 6.6 x 6.9 cm).  Biopsy clip artifact is seen within the mass. Similarly, the  previously described scattered subareolar nodules involving all  quadrants of the left breast have significantly decreased in  enhancement and size. Intensity of enhancement of the nodule  within the medial left pectoralis muscle has also decreased. Skin  thickening of the left breast persists.  Level I and level II left axillary lymphadenopathy has markedly  resolved. The largest lymph node seen on today's study is a level  I lymph node that measures 0.8 cm short axis; this node has a  normal thin cortex and normal fatty hilum.  Biopsy-proven malignancy in the lateral right breast shows a lower  level of heterogeneous enhancement on today's examination and  measures 1.4 x 1.3 x  1.4 cm (previously 2.1 x 2.0 x 2.2 cm).  Central biopsy clip artifact is present.  No right axillary or internal mammary chain lymphadenopathy is  identified.  IMPRESSION:  1. Significant positive response to neoadjuvant therapy. The  dominant mass and multiple smaller nodules in the left breast  have  significantly decreased in enhancement as well as size. Subareolar  architecture distortion and skin thickening are not significantly  changed.  2. Level I and level II left axillary lymphadenopathy has  resolved.  3. Decrease in size and enhancement of lateral right breast  invasive ductal carcinoma.   ASSESSMENT: [name]   (1)  status post bilateral breast biopsies 07/24/2011, showing, on the right, a clinical T2 N0 papillary/ductal breast cancer, estrogen and progesterone receptor positive, HER-2 negative, with an MIB-1 of 10%; on the left, a T3 N1 invasive ductal carcinoma, grade 3, triple positive, with an MIB-1 of 60%.  (2)  Status post 4 dose dense cycles of doxorubicin/cyclophosphamide, to be followed by 4 dose dense cycles of paclitaxel and trastuzumab, the trastuzumab then continued for a total of one year.  (3)  History of febrile chemo-induced neutropenia requiring hospitalization following fourth cycle of AC  (3)  the patient will need postmastectomy radiation at least on the left.  PLAN: Luisa Hart will proceed to treatment today as scheduled for her third dose dense cycle of paclitaxel with trastuzumab. She will return tomorrow for Neulasta on day 2, and I will see her next week on June 18 for assessment of chemotoxicity. We'll continue to follow her closely for any development of neuropathy.  In She is already scheduled out through her 4 cycles in late June. She is scheduled for her echocardiogram on June 20, and will followup with her surgeon on July 5 after completion of chemotherapy.  She will proceed directly to bilateral mastectomies. Reconstruction  will have to be delayed because she will need postmastectomy radiation on the left side.   She knows to call for any problems that may develop before the next visit.   Rosaleah Person    11/25/2011

## 2011-11-25 NOTE — Patient Instructions (Signed)
[facility] Discharge Instructions for Patients Receiving Chemotherapy  Today you received the following chemotherapy agents Taxol/Herceptin To help prevent nausea and vomiting after your treatment, we encourage you to take your nausea medication as prescribed.  If you develop nausea and vomiting that is not controlled by your nausea medication, call the clinic. If it is after clinic hours your family physician or the after hours number for the clinic or go to the Emergency Department.   BELOW ARE SYMPTOMS THAT SHOULD BE REPORTED IMMEDIATELY:  *FEVER GREATER THAN 100.5 F  *CHILLS WITH OR WITHOUT FEVER  NAUSEA AND VOMITING THAT IS NOT CONTROLLED WITH YOUR NAUSEA MEDICATION  *UNUSUAL SHORTNESS OF BREATH  *UNUSUAL BRUISING OR BLEEDING  TENDERNESS IN MOUTH AND THROAT WITH OR WITHOUT PRESENCE OF ULCERS  *URINARY PROBLEMS  *BOWEL PROBLEMS  UNUSUAL RASH Items with * indicate a potential emergency and should be followed up as soon as possible.  One of the nurses will contact you 24 hours after your treatment. Please let the nurse know about any problems that you may have experienced. Feel free to call the clinic you have any questions or concerns. The clinic phone number is [phone].   I have been informed and understand all the instructions given to me. I know to contact the clinic, my physician, or go to the Emergency Department if any problems should occur. I do not have any questions at this time, but understand that I may call the clinic during office hours   should I have any questions or need assistance in obtaining follow up care.    __________________________________________  _____________  __________ Signature of Patient or Authorized Representative            Date                   Time    __________________________________________ Nurse's Signature    

## 2011-11-26 ENCOUNTER — Ambulatory Visit (HOSPITAL_BASED_OUTPATIENT_CLINIC_OR_DEPARTMENT_OTHER): Payer: Medicare Other

## 2011-11-26 VITALS — BP 134/70 | HR 99 | Temp 98.7°F

## 2011-11-26 DIAGNOSIS — C773 Secondary and unspecified malignant neoplasm of axilla and upper limb lymph nodes: Secondary | ICD-10-CM

## 2011-11-26 DIAGNOSIS — C50119 Malignant neoplasm of central portion of unspecified female breast: Secondary | ICD-10-CM

## 2011-11-26 DIAGNOSIS — C50019 Malignant neoplasm of nipple and areola, unspecified female breast: Secondary | ICD-10-CM

## 2011-11-26 DIAGNOSIS — C50519 Malignant neoplasm of lower-outer quadrant of unspecified female breast: Secondary | ICD-10-CM

## 2011-11-26 MED ORDER — PEGFILGRASTIM INJECTION 6 MG/0.6ML
6.0000 mg | Freq: Once | SUBCUTANEOUS | Status: AC
  Administered 2011-11-26: 6 mg via SUBCUTANEOUS
  Filled 2011-11-26: qty 0.6

## 2011-12-02 ENCOUNTER — Telehealth: Payer: Self-pay | Admitting: *Deleted

## 2011-12-02 ENCOUNTER — Other Ambulatory Visit (HOSPITAL_BASED_OUTPATIENT_CLINIC_OR_DEPARTMENT_OTHER): Payer: Medicare Other | Admitting: Lab

## 2011-12-02 ENCOUNTER — Encounter: Payer: Self-pay | Admitting: Physician Assistant

## 2011-12-02 ENCOUNTER — Ambulatory Visit (HOSPITAL_BASED_OUTPATIENT_CLINIC_OR_DEPARTMENT_OTHER): Payer: Medicare Other | Admitting: Physician Assistant

## 2011-12-02 VITALS — BP 144/84 | HR 99 | Temp 98.3°F | Ht 68.0 in | Wt 273.0 lb

## 2011-12-02 DIAGNOSIS — C50119 Malignant neoplasm of central portion of unspecified female breast: Secondary | ICD-10-CM

## 2011-12-02 LAB — CBC WITH DIFFERENTIAL/PLATELET
BASO%: 0.4 % (ref 0.0–2.0)
MCHC: 32.9 g/dL (ref 31.5–36.0)
MONO#: 1.4 10*3/uL — ABNORMAL HIGH (ref 0.1–0.9)
RBC: 2.85 10*6/uL — ABNORMAL LOW (ref 3.70–5.45)
RDW: 22 % — ABNORMAL HIGH (ref 11.2–14.5)
WBC: 15.1 10*3/uL — ABNORMAL HIGH (ref 3.9–10.3)
lymph#: 1 10*3/uL (ref 0.9–3.3)

## 2011-12-02 NOTE — Telephone Encounter (Signed)
Per staff message I have scheduled appts. JMW  

## 2011-12-02 NOTE — Telephone Encounter (Signed)
Made patient appointment for 12-23-2011 and 01-13-2012 lab and treatment emailed michelle to set up patient's treatment

## 2011-12-02 NOTE — Progress Notes (Signed)
ID: Linda [name]   DOB: Dec 05, 1945  MR#: [id number]  WUJ#:[id number]  HISTORY OF PRESENT ILLNESS: The patient noted a small amount of drainage from her left breast December of 2012. She brought it to her gynecologist's attention in January of 2013 and was set up for bilateral diagnostic mammography at the breast Center July 24, 2011. This was the patient's first ever mammogram. It showed a large irregular mass in the left retroareolar region extending to the nipple, with nipple retraction and skin thickening. This measured approximately 8.4 cm. It was associated with pleomorphic microcalcifications. By exam there was moderate distortion and retraction of the nipple with a large palpable ill-defined area in the retroareolar region. In the right right breast there was also a 2 cm hard mass palpated. Ultrasound of the right breast mass showed a complex cystic/solid area measuring 1.9 cm. Ultrasound of the right axilla was negative. Ultrasound of the left breast showed a large hypoechoic mass measuring at least 3.8 cm. The left axilla showed several adjacent abnormal appearing lymph nodes.  With this information biopsy of the right and left breast masses were obtained the same day, and showed ([id number])  (a) on the right, and invasive ductal carcinoma with papillary features, estrogen and progesterone receptor positive, HER-2 negative, with an MIB-1 of 10%.  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 The MIB-1 was 60% for the left-sided tumor.  With this information the patient was presented at the multidisciplinary breast cancer conference 08/06/2011. Subsequent evaluation and treatments are as detailed below.  INTERVAL HISTORY: Linda [name] returns today for follow up of her bilateral breast carcinomas. She is currently day 8 cycle 3 of 4 planned dose dense cycles of paclitaxel with trastuzumab. She  receives Neulasta on day 2 for granulocyte support. She continues to tolerate treatment well with very few associated side effects. In fact interval history is really quite unremarkable.  REVIEW OF SYSTEMS: Linda [name] has had no fever, bleeding, or rash. Her energy level is good. No skin changes or abnormal bleeding. She has had no nausea or vomiting, and no change in bowel habits.. No mouth sores or mouth changes. Her cough has resolved. She has some shortness of breath with exertion, but does not feel like that has increased.Linda [name] No chest pain. No abnormal headaches or dizziness. No unusual myalgias or arthralgias. She has chronic swelling in the feet and ankles which is stable. She has some slight tingling in the feet bilaterally. She notices this only when she is sitting in her recliner at night with her feet up. She has not noticed this during the day, and she does not notice it when she is in the bed. Is not affecting any of her walking. She also has some slight tingling in her thumbs bilaterally, again not affecting any of her day-to-day activities. She is taking a B complex daily.  A detailed review of systems was otherwise noncontributory.  PAST MEDICAL HISTORY: Past Medical History  Diagnosis Date  . Cancer 08-13-11    07-31-11-Dx. Bilarteral Breast cancer-left greater than rt.  . Hematuria, undiagnosed cause 08-13-11    Being evaluated by [facility] 08-14-11    PAST SURGICAL HISTORY: Past Surgical History  Procedure Date  . Child birth 08-13-11    x3 -NVD  . Portacath placement 08/15/2011    Procedure: INSERTION PORT-A-CATH;  Surgeon: Shelly Rubenstein, MD;  Location: [facility];  Service: General;  Laterality:  N/A;    FAMILY HISTORY Family History  Problem Relation Age of Onset  . Heart disease Mother   . Heart attack Father   The patient's father died from a heart attack at the age of 85. The patient's mother died from apparently heart problems at the age of 58. The patient had no  brothers. She has 3 sisters. One of her sisters was diagnosed with breast cancer in her mid 66s. The patient does not know if his sister was ever genetically tested. The patient's mother had a mastectomy at the age of 34, presumably for breast cancer. There is no other history of breast or in cancer in the family to her knowledge.   GYNECOLOGIC HISTORY:  She does not recall when she had menarche. She had her first child at age 89. She is GX P3. She underwent menopause in her mid 24s. She never took hormone replacement.   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 She moved to [place] about 2 years ago but has a Museum/gallery curator in [place]. Son Linda [name] lives in [place] and works as a IT sales professional. His wife is a Engineer, civil (consulting). Son Linda [name] lives at [employer] and is a Art therapist in addition to having the [employer]. The patient attends a local [employer] here   ADVANCED DIRECTIVES: Not in place  HEALTH MAINTENANCE: History  Substance Use Topics  . Smoking status: Never Smoker   . Smokeless tobacco: Never Used  . Alcohol Use: Yes     rare- occ.     Colonoscopy: Never  PAP: Feb 2013  Bone density: Never  Lipid panel:  Allergies  Allergen Reactions  . Ace Inhibitors Cough    Current Outpatient Prescriptions  Medication Sig Dispense Refill  . losartan-hydrochlorothiazide (HYZAAR) 50-12.5 MG per tablet       . Alum & Mag Hydroxide-Simeth (MAGIC MOUTHWASH W/LIDOCAINE) SOLN Take 5 mLs by mouth 4 (four) times daily as needed.  300 mL  2  . b complex vitamins tablet Take 1 tablet by mouth daily.      . carvedilol (COREG) 3.125 MG tablet Take 1 tablet (3.125 mg total) by mouth 2 (two) times daily with a meal.  60 tablet  3  . ciprofloxacin (CIPRO) 500 MG tablet Take 1 tablet (500 mg total) by mouth 2 (two) times  daily.  14 tablet  3  . dexamethasone (DECADRON) 4 MG tablet       . Multiple Vitamin (MULTIVITAMIN) capsule Take 1 capsule by mouth daily. States only daily-will stop today.      Linda [name] omeprazole (PRILOSEC) 40 MG capsule Take 1 capsule (40 mg total) by mouth daily.  30 capsule  12  . potassium chloride SA (K-DUR,KLOR-CON) 20 MEQ tablet Take 1 tablet (20 mEq total) by mouth daily.  30 tablet  6  . prochlorperazine (COMPAZINE) 10 MG tablet Take 1 tablet (10 mg total) by mouth every 6 (six) hours as needed.  30 tablet  2    OBJECTIVE: [name] in no acute distress Filed Vitals:   12/02/11 1059  BP: 144/84  Pulse: 99  Temp: 98.3 F (36.8 C)     Body mass index is 41.51 kg/(m^2).    ECOG FS: 1  Filed Weights   12/02/11 1059  Weight: 273 lb (123.832  kg)   Physical Exam: HEENT:  Sclerae anicteric, conjunctivae pink.  Oropharynx clear.  No mucositis or candidiasis.   Nodes:  No cervical, supraclavicular, or axillary lymphadenopathy palpated.  Breast Exam:  Deferred  Lungs:  Clear to auscultation bilaterally.  No crackles, rhonchi, or wheezes.   Heart:  Regular rate and rhythm.   Abdomen:  Soft, obese, nontender.  Positive bowel sounds.  No organomegaly or masses palpated.   Musculoskeletal:  No focal spinal tenderness to palpation.  Extremities:  1+ pitting edema bilaterally in the lower extremities. Equal bilaterally with no erythema or palpable cords. No upper extremity edema. No peripheral cyanosis.   Skin:  Benign.   Neuro:  Nonfocal. Alert and oriented x3.    LAB RESULTS: Lab Results  Component Value Date   WBC 15.1* 12/02/2011   NEUTROABS 12.5* 12/02/2011   HGB 9.1* 12/02/2011   HCT 27.7* 12/02/2011   MCV 97.2 12/02/2011   PLT 139* 12/02/2011      Chemistry      Component Value Date/Time   NA 139 11/25/2011 0940   K 4.1 11/25/2011 0940   CL 103 11/25/2011 0940   CO2 28 11/25/2011 0940   BUN 14 11/25/2011 0940   CREATININE 0.52 11/25/2011 0940      Component Value  Date/Time   CALCIUM 9.3 11/25/2011 0940   ALKPHOS 89 11/25/2011 0940   AST 15 11/25/2011 0940   ALT 16 11/25/2011 0940   BILITOT 0.4 11/25/2011 0940       Lab Results  Component Value Date   LABCA2 87* 08/06/2011    STUDIES:  No recent studies.  Patient scheduled for repeat echocardiogram on 12/04/2011.    ASSESSMENT: [name]   (1)  status post bilateral breast biopsies 07/24/2011, showing, on the right, a clinical T2 N0 papillary/ductal breast cancer, estrogen and progesterone receptor positive, HER-2 negative, with an MIB-1 of 10%; on the left, a T3 N1 invasive ductal carcinoma, grade 3, triple positive, with an MIB-1 of 60%.  (2)  Status post 4 dose dense cycles of doxorubicin/cyclophosphamide, to be followed by 4 dose dense cycles of paclitaxel and trastuzumab, the trastuzumab then continued for a total of one year.  (3)  History of febrile chemo-induced neutropenia requiring hospitalization following fourth cycle of AC  (3)  the patient will need postmastectomy radiation at least on the left.  PLAN: Linda [name] will return next week for followup on June 25 prior to her fourth and final dose of paclitaxel with trastuzumab. We will continue to follow her closely for any signs of increased peripheral neuropathy. She'll see Dr. Darnelle Catalan the following week on July 3, and will see her surgeon on July 5 to plan definitive surgery. Her plan is to proceed directly to bilateral mastectomies. Reconstruction will have to be delayed because she will need postmastectomy radiation on the left side.   Of course we will be continuing trastuzumab for total of one year. She receive trastuzumab with paclitaxel on June 25. She receive trastuzumab alone 2 weeks later on July 9, and from that point forward will be treated every 3 weeks. These appointments have been scheduled accordingly.  Pat voices understanding and agreement with this plan. She knows to call for any problems that may  develop before the next visit.   Atticus Wedin    12/02/2011

## 2011-12-04 ENCOUNTER — Encounter (HOSPITAL_COMMUNITY): Payer: Self-pay

## 2011-12-04 ENCOUNTER — Ambulatory Visit (HOSPITAL_COMMUNITY)
Admission: RE | Admit: 2011-12-04 | Discharge: 2011-12-04 | Disposition: A | Discharge status: Home / Self Care | Payer: Medicare Other | Source: Ambulatory Visit | Attending: Adult Health | Admitting: Adult Health

## 2011-12-04 ENCOUNTER — Ambulatory Visit (HOSPITAL_COMMUNITY)
Admission: RE | Admit: 2011-12-04 | Discharge: 2011-12-04 | Disposition: A | Discharge status: Home / Self Care | Payer: Medicare Other | Source: Ambulatory Visit | Attending: Internal Medicine | Admitting: Internal Medicine

## 2011-12-04 VITALS — BP 138/76 | HR 85 | Resp 18 | Ht 68.0 in | Wt 271.0 lb

## 2011-12-04 DIAGNOSIS — C50119 Malignant neoplasm of central portion of unspecified female breast: Secondary | ICD-10-CM

## 2011-12-04 DIAGNOSIS — I509 Heart failure, unspecified: Secondary | ICD-10-CM | POA: Insufficient documentation

## 2011-12-04 DIAGNOSIS — I10 Essential (primary) hypertension: Secondary | ICD-10-CM | POA: Insufficient documentation

## 2011-12-04 DIAGNOSIS — C50919 Malignant neoplasm of unspecified site of unspecified female breast: Secondary | ICD-10-CM | POA: Insufficient documentation

## 2011-12-04 DIAGNOSIS — I517 Cardiomegaly: Secondary | ICD-10-CM | POA: Insufficient documentation

## 2011-12-04 DIAGNOSIS — R609 Edema, unspecified: Secondary | ICD-10-CM

## 2011-12-04 MED ORDER — LOSARTAN POTASSIUM-HCTZ 50-12.5 MG PO TABS: 1.0000 | ORAL_TABLET | Freq: Every day | ORAL | Status: AC

## 2011-12-04 NOTE — Patient Instructions (Addendum)
Recommend Primary Care with Dr. Lezlie Octave at [facility] Hyzaar 50/12.5 mg daily.   Follow up with Dr. Gala Romney as needed.

## 2011-12-04 NOTE — Assessment & Plan Note (Addendum)
Doing well. No clinical HF. Echo reviewed personally in clinic - all parameters stable. She has done well. Completing Herceptin soon so will not need further f/u at this point.

## 2011-12-04 NOTE — Progress Notes (Signed)
Oncologist: Dr Darnelle Catalan  HPI:  Linda [name] is a [name] status post bilateral breast biopsies 07/24/2011, showing, on the right, a clinical T2 N0 papillary/ductal breast cancer, estrogen and progesterone receptor positive, HER-2 negative, with an MIB-1 of 10%; on the left, a T3 N1 invasive ductal carcinoma, grade 3, triple positive, with an MIB-1 of 60%. She will begin  cyclophosphamide and doxorubicin  in dose dense fashion x4, to be followed by paclitaxel and trastuzumab again every 2 weeks x4, with the trastuzumab continue to complete a year. When she completes the 8 weeks of chemotherapy as she will be ready for surgery.   Echos: 08/20/11 EF 60-65% Lateral S' 8.5 mod LVH  12/04/11 EF 0-65% lateral s' 8.4  Mild LVH (poor window)  Returns for routine follow up today.  Feels well.  Herceptin will be finished next week.   She goes on July 5th to see Dr. Magnus Ivan for schedule double mastectomy to be scheduled then 5 days radiation.  Decreased energy but this is unchanged since start of chemo.  Lower extremity edema noted.  Denies SOB/orthopnea/PND.  No fever/chills.  She had been on hyzaar in April and is unsure why she is not taking it anymore.  Reviewing chart it appears it fell off her list but there is not a stated reason.    Review of Systems: All pertinent positives and negatives as in HPI, otherwise negative.   Past Medical History  Diagnosis Date  . Cancer 08-13-11    07-31-11-Dx. Bilarteral Breast cancer-left greater than rt.  . Hematuria, undiagnosed cause 08-13-11    Being evaluated by [facility] 08-14-11    Current Outpatient Prescriptions  Medication Sig Dispense Refill  . Alum & Mag Hydroxide-Simeth (MAGIC MOUTHWASH W/LIDOCAINE) SOLN Take 5 mLs by mouth 4 (four) times daily as needed.  300 mL  2  . b complex vitamins tablet Take 1 tablet by mouth daily.      . carvedilol (COREG) 3.125 MG tablet Take 1 tablet (3.125 mg total) by mouth 2 (two) times daily with a  meal.  60 tablet  3  . ciprofloxacin (CIPRO) 500 MG tablet Take 1 tablet (500 mg total) by mouth 2 (two) times daily.  14 tablet  3  . dexamethasone (DECADRON) 4 MG tablet Take 4 mg by mouth daily with breakfast.       . Multiple Vitamin (MULTIVITAMIN) capsule Take 1 capsule by mouth daily. States only daily-will stop today.      [name] omeprazole (PRILOSEC) 40 MG capsule Take 1 capsule (40 mg total) by mouth daily.  30 capsule  12  . potassium chloride SA (K-DUR,KLOR-CON) 20 MEQ tablet Take 1 tablet (20 mEq total) by mouth daily.  30 tablet  6  . prochlorperazine (COMPAZINE) 10 MG tablet Take 1 tablet (10 mg total) by mouth every 6 (six) hours as needed.  30 tablet  2  . losartan-hydrochlorothiazide (HYZAAR) 50-12.5 MG per tablet Take 1 tablet by mouth daily.  30 tablet  3     Allergies  Allergen Reactions  . Ace Inhibitors Cough     PHYSICAL EXAM: Filed Vitals:   12/04/11 1104  BP: 138/76  Pulse: 85  Resp: 18  Height: 5\' 8"  (1.727 m)  Weight: 271 lb (122.925 kg)  SpO2: 99%   General:  Obese. Well appearing. No respiratory difficulty HEENT: normal Neck: supple. JVD difficult to assess. Carotids 2+ bilat; no bruits. No lymphadenopathy or thryomegaly appreciated. Cor: PMI nondisplaced. Regular rate & rhythm. No rubs,  gallops or murmurs. Lungs: clear Abdomen: soft, obese nontender, nondistended.No bruits or masses. Good bowel sounds. Extremities: no cyanosis, clubbing, rash, 1+ edema Neuro: alert & oriented x 3, cranial nerves grossly intact. moves all 4 extremities w/o difficulty. Affect pleasant.  ASSESSMENT & PLAN:

## 2011-12-04 NOTE — Progress Notes (Signed)
  Echocardiogram 2D Echocardiogram has been performed.  Linda [name], [facility] 12/04/2011, 11:53 AM

## 2011-12-04 NOTE — Assessment & Plan Note (Deleted)
Will restart Hyzaar 50/12.5 mg.  Have instructed her she needs to follow up with a PCP, she will call Linda [name] for new patient appointment.

## 2011-12-04 NOTE — Assessment & Plan Note (Signed)
Likely venous stasis. Unclear why Hyzaar fell off med list. Will restart. Can give occasional lasix as needed.

## 2011-12-09 ENCOUNTER — Ambulatory Visit (HOSPITAL_BASED_OUTPATIENT_CLINIC_OR_DEPARTMENT_OTHER): Payer: Medicare Other | Admitting: Physician Assistant

## 2011-12-09 ENCOUNTER — Ambulatory Visit: Payer: Medicare Other

## 2011-12-09 ENCOUNTER — Other Ambulatory Visit (HOSPITAL_BASED_OUTPATIENT_CLINIC_OR_DEPARTMENT_OTHER): Payer: Medicare Other | Admitting: Lab

## 2011-12-09 ENCOUNTER — Encounter: Payer: Self-pay | Admitting: Physician Assistant

## 2011-12-09 ENCOUNTER — Ambulatory Visit (HOSPITAL_BASED_OUTPATIENT_CLINIC_OR_DEPARTMENT_OTHER): Payer: Medicare Other

## 2011-12-09 VITALS — BP 125/77 | HR 102 | Temp 98.2°F | Ht 68.0 in | Wt 274.8 lb

## 2011-12-09 DIAGNOSIS — Z170 Estrogen receptor positive status [ER+]: Secondary | ICD-10-CM

## 2011-12-09 DIAGNOSIS — C50119 Malignant neoplasm of central portion of unspecified female breast: Secondary | ICD-10-CM

## 2011-12-09 DIAGNOSIS — C50519 Malignant neoplasm of lower-outer quadrant of unspecified female breast: Secondary | ICD-10-CM

## 2011-12-09 DIAGNOSIS — C50019 Malignant neoplasm of nipple and areola, unspecified female breast: Secondary | ICD-10-CM

## 2011-12-09 DIAGNOSIS — C773 Secondary and unspecified malignant neoplasm of axilla and upper limb lymph nodes: Secondary | ICD-10-CM

## 2011-12-09 DIAGNOSIS — Z5111 Encounter for antineoplastic chemotherapy: Secondary | ICD-10-CM

## 2011-12-09 LAB — COMPREHENSIVE METABOLIC PANEL
CO2: 24 mEq/L (ref 19–32)
Creatinine, Ser: 0.52 mg/dL (ref 0.50–1.10)
Glucose, Bld: 152 mg/dL — ABNORMAL HIGH (ref 70–99)
Total Bilirubin: 0.4 mg/dL (ref 0.3–1.2)

## 2011-12-09 LAB — CBC WITH DIFFERENTIAL/PLATELET
Eosinophils Absolute: 0.1 10*3/uL (ref 0.0–0.5)
HCT: 29.4 % — ABNORMAL LOW (ref 34.8–46.6)
LYMPH%: 12.7 % — ABNORMAL LOW (ref 14.0–49.7)
MCHC: 33 g/dL (ref 31.5–36.0)
MCV: 97.5 fL (ref 79.5–101.0)
MONO#: 0.4 10*3/uL (ref 0.1–0.9)
NEUT#: 3.7 10*3/uL (ref 1.5–6.5)
NEUT%: 75.4 % (ref 38.4–76.8)
Platelets: 189 10*3/uL (ref 145–400)
WBC: 4.9 10*3/uL (ref 3.9–10.3)

## 2011-12-09 MED ORDER — HEPARIN SOD (PORK) LOCK FLUSH 100 UNIT/ML IV SOLN
500.0000 [IU] | Freq: Once | INTRAVENOUS | Status: AC | PRN
  Administered 2011-12-09: 500 [IU]
  Filled 2011-12-09: qty 5

## 2011-12-09 MED ORDER — DIPHENHYDRAMINE HCL 50 MG/ML IJ SOLN
50.0000 mg | Freq: Once | INTRAMUSCULAR | Status: AC
  Administered 2011-12-09: 50 mg via INTRAVENOUS

## 2011-12-09 MED ORDER — PACLITAXEL CHEMO INJECTION 300 MG/50ML
175.0000 mg/m2 | Freq: Once | INTRAVENOUS | Status: AC
  Administered 2011-12-09: 420 mg via INTRAVENOUS
  Filled 2011-12-09: qty 70

## 2011-12-09 MED ORDER — SODIUM CHLORIDE 0.9 % IJ SOLN
10.0000 mL | INTRAMUSCULAR | Status: DC | PRN
  Administered 2011-12-09: 10 mL
  Filled 2011-12-09: qty 10

## 2011-12-09 MED ORDER — FAMOTIDINE IN NACL 20-0.9 MG/50ML-% IV SOLN
20.0000 mg | Freq: Once | INTRAVENOUS | Status: AC
  Administered 2011-12-09: 20 mg via INTRAVENOUS

## 2011-12-09 MED ORDER — ACETAMINOPHEN 325 MG PO TABS
650.0000 mg | ORAL_TABLET | Freq: Once | ORAL | Status: AC
  Administered 2011-12-09: 650 mg via ORAL

## 2011-12-09 MED ORDER — SODIUM CHLORIDE 0.9 % IV SOLN
Freq: Once | INTRAVENOUS | Status: AC
  Administered 2011-12-09: 13:00:00 via INTRAVENOUS

## 2011-12-09 MED ORDER — ONDANSETRON 8 MG/50ML IVPB (CHCC)
8.0000 mg | Freq: Once | INTRAVENOUS | Status: AC
  Administered 2011-12-09: 8 mg via INTRAVENOUS

## 2011-12-09 MED ORDER — TRASTUZUMAB CHEMO INJECTION 440 MG
4.0000 mg/kg | Freq: Once | INTRAVENOUS | Status: AC
  Administered 2011-12-09: 483 mg via INTRAVENOUS
  Filled 2011-12-09: qty 23

## 2011-12-09 MED ORDER — DEXAMETHASONE SODIUM PHOSPHATE 4 MG/ML IJ SOLN
20.0000 mg | Freq: Once | INTRAMUSCULAR | Status: AC
  Administered 2011-12-09: 20 mg via INTRAVENOUS

## 2011-12-09 NOTE — Patient Instructions (Addendum)
[facility] Discharge Instructions for Patients Receiving Chemotherapy  Today you received the following chemotherapy agents Herceptin and Taxol.  To help prevent nausea and vomiting after your treatment, we encourage you to take your nausea medication. Begin taking it as often as prescribed for by your physician.    If you develop nausea and vomiting that is not controlled by your nausea medication, call the clinic. If it is after clinic hours your family physician or the after hours number for the clinic or go to the Emergency Department.   BELOW ARE SYMPTOMS THAT SHOULD BE REPORTED IMMEDIATELY:  *FEVER GREATER THAN 100.5 F  *CHILLS WITH OR WITHOUT FEVER  NAUSEA AND VOMITING THAT IS NOT CONTROLLED WITH YOUR NAUSEA MEDICATION  *UNUSUAL SHORTNESS OF BREATH  *UNUSUAL BRUISING OR BLEEDING  TENDERNESS IN MOUTH AND THROAT WITH OR WITHOUT PRESENCE OF ULCERS  *URINARY PROBLEMS  *BOWEL PROBLEMS  UNUSUAL RASH Items with * indicate a potential emergency and should be followed up as soon as possible.  One of the nurses will contact you 24 hours after your treatment. Please let the nurse know about any problems that you may have experienced. Feel free to call the clinic you have any questions or concerns. The clinic phone number is [phone].   I have been informed and understand all the instructions given to me. I know to contact the clinic, my physician, or go to the Emergency Department if any problems should occur. I do not have any questions at this time, but understand that I may call the clinic during office hours   should I have any questions or need assistance in obtaining follow up care.    __________________________________________  _____________  __________ Signature of Patient or Authorized Representative            Date                   Time    __________________________________________ Nurse's Signature

## 2011-12-09 NOTE — Progress Notes (Signed)
ID: Linda [name]   DOB: Oct 20, 1945  MR#: [id number]  WGN#:[id number]  HISTORY OF PRESENT ILLNESS: The patient noted a small amount of drainage from her left breast December of 2012. She brought it to her gynecologist's attention in January of 2013 and was set up for bilateral diagnostic mammography at the breast Center July 24, 2011. This was the patient's first ever mammogram. It showed a large irregular mass in the left retroareolar region extending to the nipple, with nipple retraction and skin thickening. This measured approximately 8.4 cm. It was associated with pleomorphic microcalcifications. By exam there was moderate distortion and retraction of the nipple with a large palpable ill-defined area in the retroareolar region. In the right right breast there was also a 2 cm hard mass palpated. Ultrasound of the right breast mass showed a complex cystic/solid area measuring 1.9 cm. Ultrasound of the right axilla was negative. Ultrasound of the left breast showed a large hypoechoic mass measuring at least 3.8 cm. The left axilla showed several adjacent abnormal appearing lymph nodes.  With this information biopsy of the right and left breast masses were obtained the same day, and showed ([id number])  (a) on the right, and invasive ductal carcinoma with papillary features, estrogen and progesterone receptor positive, HER-2 negative, with an MIB-1 of 10%.  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 The MIB-1 was 60% for the left-sided tumor.  With this information the patient was presented at the multidisciplinary breast cancer conference 08/06/2011. Subsequent evaluation and treatments are as detailed below.  INTERVAL HISTORY: Linda [name] returns today for follow up of her bilateral breast carcinomas. She is currently of 4 planned dose dense cycles of paclitaxel with trastuzumab. She receives Neulasta on  day 2 for granulocyte support.   Since her last appointment here, Linda [name] had a repeat echocardiogram and followed up with Dr. Gala Romney. (Of course she will continue to be followed every 3 months until we complete her trastuzumab in 1 year.)  REVIEW OF SYSTEMS: Linda [name] has had no fever, chills, abnormal bleeding, or rash. Her energy level is good.  She has had no nausea or vomiting, and no change in bowel habits. No mouth sores or oral sensitivity. Her cough has resolved. She has some shortness of breath with exertion, but does not feel like that has increased. No chest pain. No abnormal headaches or dizziness. No unusual myalgias or arthralgias. She has chronic swelling in the feet and ankles which is stable.   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 She does not notice the numbness or tingling in the feet during the day, and it is not affecting her walking. She does feel at slightly at night, but does not find it particularly bothersome. The tingling in her right forearm is not affecting any of her day-to-day activities. She does not feel like any of this has increased significantly since receiving her third dose of paclitaxel 2 weeks ago. She is taking a B complex daily.  A detailed review of systems was otherwise noncontributory.  PAST MEDICAL HISTORY: Past Medical History  Diagnosis Date  . Cancer 08-13-11    07-31-11-Dx. Bilarteral Breast cancer-left greater than rt.  . Hematuria, undiagnosed cause 08-13-11    Being evaluated by [facility] 08-14-11    PAST SURGICAL HISTORY: Past Surgical History  Procedure Date  .  Child birth 08-13-11    x3 -NVD  . Portacath placement 08/15/2011    Procedure: INSERTION PORT-A-CATH;  Surgeon: Shelly Rubenstein, MD;  Location: [facility];  Service: General;  Laterality: N/A;    FAMILY HISTORY Family History  Problem Relation Age of Onset  . Heart disease Mother     . Heart attack Father   The patient's father died from a heart attack at the age of 83. The patient's mother died from apparently heart problems at the age of 23. The patient had no brothers. She has 3 sisters. One of her sisters was diagnosed with breast cancer in her mid 76s. The patient does not know if his sister was ever genetically tested. The patient's mother had a mastectomy at the age of 25, presumably for breast cancer. There is no other history of breast or in cancer in the family to her knowledge.   GYNECOLOGIC HISTORY:  She does not recall when she had menarche. She had her first child at age 58. She is GX P3. She underwent menopause in her mid 18s. She never took hormone replacement.   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 She moved to [place] about 2 years ago but has a Museum/gallery curator in [place]. Son Thayer Ohm lives in [place] and works as a IT sales professional. His wife is a Engineer, civil (consulting). Son Onalee Hua lives at [employer] and is a Art therapist in addition to having the [employer]. The patient attends a local [employer] here   ADVANCED DIRECTIVES: Not in place  HEALTH MAINTENANCE: History  Substance Use Topics  . Smoking status: Never Smoker   . Smokeless tobacco: Never Used  . Alcohol Use: Yes     rare- occ.     Colonoscopy: Never  PAP: Feb 2013  Bone density: Never  Lipid panel:  Allergies  Allergen Reactions  . Ace Inhibitors Cough    Current Outpatient Prescriptions  Medication Sig Dispense Refill  . Alum & Mag Hydroxide-Simeth (MAGIC MOUTHWASH W/LIDOCAINE) SOLN Take 5 mLs by mouth 4 (four) times daily as needed.  300 mL  2  . b complex vitamins tablet Take 1 tablet by mouth daily.      . carvedilol (COREG) 3.125 MG tablet Take 1 tablet (3.125 mg total) by mouth 2 (two) times daily with a  meal.  60 tablet  3  . ciprofloxacin (CIPRO) 500 MG tablet Take 1 tablet (500 mg total) by mouth 2 (two) times daily.  14 tablet  3  . dexamethasone (DECADRON) 4 MG tablet Take 4 mg by mouth daily with breakfast.       . losartan-hydrochlorothiazide (HYZAAR) 50-12.5 MG per tablet Take 1 tablet by mouth daily.  30 tablet  3  . Multiple Vitamin (MULTIVITAMIN) capsule Take 1 capsule by mouth daily. States only daily-will stop today.      [name] omeprazole (PRILOSEC) 40 MG capsule Take 1 capsule (40 mg total) by mouth daily.  30 capsule  12  . potassium chloride SA (K-DUR,KLOR-CON) 20 MEQ tablet Take 1 tablet (20 mEq total) by mouth daily.  30 tablet  6  . prochlorperazine (COMPAZINE) 10 MG tablet Take 1 tablet (10 mg total) by mouth every 6 (six) hours as needed.  30 tablet  2    OBJECTIVE: [name] in  no acute distress Filed Vitals:   12/09/11 1111  BP: 125/77  Pulse: 102  Temp: 98.2 F (36.8 C)     Body mass index is 41.78 kg/(m^2).    ECOG FS: 1  Filed Weights   12/09/11 1111  Weight: 274 lb 12.8 oz (124.648 kg)   Physical Exam: HEENT:  Sclerae anicteric, conjunctivae pink.  Oropharynx clear.  No mucositis or candidiasis.   Nodes:  No cervical, supraclavicular, or axillary lymphadenopathy palpated.  Breast Exam:  Right breast, unable to palpate a distinct mass. Breast is otherwise benign with no obvious skin changes. Left breast, there is some distortion of the areolar complex, and some thickening of the tissue in the central portion of the breast.  Lungs:  Clear to auscultation bilaterally.  No crackles, rhonchi, or wheezes.   Heart:  Regular rate and rhythm.   Abdomen:  Soft, obese, nontender.  Positive bowel sounds.  No organomegaly or masses palpated.   Musculoskeletal:  No focal spinal tenderness to palpation.  Extremities:  1+ pitting edema bilaterally in the lower extremities. Equal bilaterally with no erythema or palpable cords. No upper extremity edema. No peripheral  cyanosis.   Skin:  Benign.   Neuro:  Nonfocal. Alert and oriented x3.    LAB RESULTS: Lab Results  Component Value Date   WBC 4.9 12/09/2011   NEUTROABS 3.7 12/09/2011   HGB 9.7* 12/09/2011   HCT 29.4* 12/09/2011   MCV 97.5 12/09/2011   PLT 189 12/09/2011      Chemistry      Component Value Date/Time   NA 139 11/25/2011 0940   K 4.1 11/25/2011 0940   CL 103 11/25/2011 0940   CO2 28 11/25/2011 0940   BUN 14 11/25/2011 0940   CREATININE 0.52 11/25/2011 0940      Component Value Date/Time   CALCIUM 9.3 11/25/2011 0940   ALKPHOS 89 11/25/2011 0940   AST 15 11/25/2011 0940   ALT 16 11/25/2011 0940   BILITOT 0.4 11/25/2011 0940       Lab Results  Component Value Date   LABCA2 87* 08/06/2011    STUDIES:  Echocardiogram, 12/04/11, EF = 55-60%    ASSESSMENT: [name]   (1)  status post bilateral breast biopsies 07/24/2011, showing, on the right, a clinical T2 N0 papillary/ductal breast cancer, estrogen and progesterone receptor positive, HER-2 negative, with an MIB-1 of 10%; on the left, a T3 N1 invasive ductal carcinoma, grade 3, triple positive, with an MIB-1 of 60%.  (2)  Status post 4 dose dense cycles of doxorubicin/cyclophosphamide, to be followed by 4 dose dense cycles of paclitaxel and trastuzumab, the trastuzumab then continued for a total of one year.  (3)  History of febrile chemo-induced neutropenia requiring hospitalization following fourth cycle of AC  (3)  the patient will need postmastectomy radiation at least on the left.  PLAN: Linda [name] will proceed to treatment today as scheduled for her fourth and final dose of dose dense paclitaxel given with trastuzumab. She will have her Neulasta injection tomorrow, and will followup with Dr. Darnelle Catalan next week. She is also scheduled to meet with her surgeon, Dr. Magnus Ivan, next week on July 5 to discuss her definitive surgery.   Her plan is to proceed directly to bilateral mastectomies. Reconstruction will have to  be delayed because she will need postmastectomy radiation on the left side.   Of course we will be continuing trastuzumab for total of one year. She will receive trastuzumab alone in 2 weeks on  July 9, and from that point forward will be treated every 3 weeks. These appointments have been scheduled accordingly.  Pat voices understanding and agreement with this plan. She knows to call for any problems that may develop before the next visit.   Kyleen Villatoro    12/09/2011

## 2011-12-10 ENCOUNTER — Other Ambulatory Visit (HOSPITAL_COMMUNITY): Payer: Self-pay | Admitting: Adult Health

## 2011-12-10 ENCOUNTER — Ambulatory Visit (HOSPITAL_BASED_OUTPATIENT_CLINIC_OR_DEPARTMENT_OTHER): Payer: Medicare Other

## 2011-12-10 VITALS — BP 140/75 | HR 109 | Temp 97.5°F

## 2011-12-10 DIAGNOSIS — C50519 Malignant neoplasm of lower-outer quadrant of unspecified female breast: Secondary | ICD-10-CM

## 2011-12-10 DIAGNOSIS — C50019 Malignant neoplasm of nipple and areola, unspecified female breast: Secondary | ICD-10-CM

## 2011-12-10 DIAGNOSIS — Z5189 Encounter for other specified aftercare: Secondary | ICD-10-CM

## 2011-12-10 DIAGNOSIS — C773 Secondary and unspecified malignant neoplasm of axilla and upper limb lymph nodes: Secondary | ICD-10-CM

## 2011-12-10 DIAGNOSIS — C50119 Malignant neoplasm of central portion of unspecified female breast: Secondary | ICD-10-CM

## 2011-12-10 MED ORDER — PEGFILGRASTIM INJECTION 6 MG/0.6ML
6.0000 mg | Freq: Once | SUBCUTANEOUS | Status: AC
  Administered 2011-12-10: 6 mg via SUBCUTANEOUS
  Filled 2011-12-10: qty 0.6

## 2011-12-15 ENCOUNTER — Other Ambulatory Visit: Payer: Self-pay | Admitting: Certified Registered Nurse Anesthetist

## 2011-12-17 ENCOUNTER — Ambulatory Visit (HOSPITAL_BASED_OUTPATIENT_CLINIC_OR_DEPARTMENT_OTHER): Payer: Medicare Other | Admitting: Oncology

## 2011-12-17 ENCOUNTER — Other Ambulatory Visit (HOSPITAL_BASED_OUTPATIENT_CLINIC_OR_DEPARTMENT_OTHER): Payer: Medicare Other | Admitting: Lab

## 2011-12-17 VITALS — BP 120/74 | HR 116 | Temp 98.4°F | Ht 68.0 in | Wt 272.0 lb

## 2011-12-17 DIAGNOSIS — C50519 Malignant neoplasm of lower-outer quadrant of unspecified female breast: Secondary | ICD-10-CM

## 2011-12-17 DIAGNOSIS — C50119 Malignant neoplasm of central portion of unspecified female breast: Secondary | ICD-10-CM

## 2011-12-17 DIAGNOSIS — C773 Secondary and unspecified malignant neoplasm of axilla and upper limb lymph nodes: Secondary | ICD-10-CM

## 2011-12-17 DIAGNOSIS — Z170 Estrogen receptor positive status [ER+]: Secondary | ICD-10-CM

## 2011-12-17 DIAGNOSIS — C801 Malignant (primary) neoplasm, unspecified: Secondary | ICD-10-CM | POA: Insufficient documentation

## 2011-12-17 DIAGNOSIS — C50019 Malignant neoplasm of nipple and areola, unspecified female breast: Secondary | ICD-10-CM

## 2011-12-17 LAB — CBC WITH DIFFERENTIAL/PLATELET
Eosinophils Absolute: 0.3 10*3/uL (ref 0.0–0.5)
HCT: 32.6 % — ABNORMAL LOW (ref 34.8–46.6)
HGB: 10.5 g/dL — ABNORMAL LOW (ref 11.6–15.9)
LYMPH%: 8.1 % — ABNORMAL LOW (ref 14.0–49.7)
MONO#: 1.2 10*3/uL — ABNORMAL HIGH (ref 0.1–0.9)
NEUT#: 13.4 10*3/uL — ABNORMAL HIGH (ref 1.5–6.5)
NEUT%: 82.9 % — ABNORMAL HIGH (ref 38.4–76.8)
Platelets: 195 10*3/uL (ref 145–400)
WBC: 16.2 10*3/uL — ABNORMAL HIGH (ref 3.9–10.3)

## 2011-12-17 NOTE — Progress Notes (Signed)
ID: Linda [name]   DOB: Jul 04, 1945  MR#: [id number]  WUJ#:[id number]  HISTORY OF PRESENT ILLNESS: The patient noted a small amount of drainage from her left breast December of 2012. She brought it to her gynecologist's attention in January of 2013 and was set up for bilateral diagnostic mammography at the breast Center July 24, 2011. This was the patient's first ever mammogram. It showed a large irregular mass in the left retroareolar region extending to the nipple, with nipple retraction and skin thickening. This measured approximately 8.4 cm. It was associated with pleomorphic microcalcifications. By exam there was moderate distortion and retraction of the nipple with a large palpable ill-defined area in the retroareolar region. In the right right breast there was also a 2 cm hard mass palpated. Ultrasound of the right breast mass showed a complex cystic/solid area measuring 1.9 cm. Ultrasound of the right axilla was negative. Ultrasound of the left breast showed a large hypoechoic mass measuring at least 3.8 cm. The left axilla showed several adjacent abnormal appearing lymph nodes.  With this information biopsy of the right and left breast masses were obtained the same day, and showed ([id number])  (a) on the right, and invasive ductal carcinoma with papillary features, estrogen and progesterone receptor positive, HER-2 negative, with an MIB-1 of 10%.  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 The MIB-1 was 60% for the left-sided tumor.  With this information the patient was presented at the multidisciplinary breast cancer conference 08/06/2011. Subsequent evaluation and treatments are as detailed below.  INTERVAL HISTORY: Linda [name] Bible returns today for follow up of her bilateral breast carcinomas. She completed chemotherapy. She tells me "it was not as bad as I expected".  REVIEW OF SYSTEMS: In  particular she tells me she is never developed nausea or vomiting problems, she had no fevers, and never developed peripheral neuropathy symptoms. She did feel moderately fatigued, but was able to continue of her normal activities including work. She doesn't exercise regularly. A detailed review of systems was otherwise entirely negative.  PAST MEDICAL HISTORY: Past Medical History  Diagnosis Date  . Cancer 08-13-11    07-31-11-Dx. Bilarteral Breast cancer-left greater than rt.  . Hematuria, undiagnosed cause 08-13-11    Being evaluated by [facility] 08-14-11    PAST SURGICAL HISTORY: Past Surgical History  Procedure Date  . Child birth 08-13-11    x3 -NVD  . Portacath placement 08/15/2011    Procedure: INSERTION PORT-A-CATH;  Surgeon: Shelly Rubenstein, MD;  Location: [facility];  Service: General;  Laterality: N/A;    FAMILY HISTORY Family History  Problem Relation Age of Onset  . Heart disease Mother   . Heart attack Father   The patient's father died from a heart attack at the age of 75. The patient's mother died from apparently heart problems at the age of 60. The patient had no brothers. She has 3 sisters. One of her sisters was diagnosed with breast cancer in her mid 72s. The patient does not know if his sister was ever genetically tested. The patient's mother had a mastectomy at the age of 6, presumably for breast cancer. There is no other history of breast or in cancer in the family to her knowledge.   GYNECOLOGIC HISTORY:  She does not recall when she had menarche. She had her first child at age 72. She is GX P3. She underwent menopause in her mid 90s.  She never took hormone replacement.   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 She moved to [place] about 2 years ago but has a Museum/gallery curator in [place]. Son Linda [name]  lives in [place] and works as a IT sales professional. His wife is a Engineer, civil (consulting). Son Linda [name] lives at [employer] and is a Art therapist in addition to having the [employer]. The patient attends a local [employer] here   ADVANCED DIRECTIVES: Not in place  HEALTH MAINTENANCE: History  Substance Use Topics  . Smoking status: Never Smoker   . Smokeless tobacco: Never Used  . Alcohol Use: Yes     rare- occ.     Colonoscopy: Never  PAP: Feb 2013  Bone density: Never  Lipid panel:  Allergies  Allergen Reactions  . Ace Inhibitors Cough    Current Outpatient Prescriptions  Medication Sig Dispense Refill  . b complex vitamins tablet Take 1 tablet by mouth daily.      . carvedilol (COREG) 3.125 MG tablet Take 1 tablet (3.125 mg total) by mouth 2 (two) times daily with a meal.  60 tablet  3  . ciprofloxacin (CIPRO) 500 MG tablet Take 1 tablet (500 mg total) by mouth 2 (two) times daily.  14 tablet  3  . KLOR-CON M20 20 MEQ tablet TAKE 1 TABLET BY MOUTH ONCE DAILY AS NEEDED  30 tablet  1  . LORazepam (ATIVAN) 0.5 MG tablet Take 0.5 mg by mouth every 8 (eight) hours.      [name] losartan-hydrochlorothiazide (HYZAAR) 50-12.5 MG per tablet Take 1 tablet by mouth daily.  30 tablet  3  . Multiple Vitamin (MULTIVITAMIN) capsule Take 1 capsule by mouth daily. States only daily-will stop today.      . Multiple Vitamins-Iron (MULTIVITAMINS WITH IRON) TABS Take 1 tablet by mouth daily.      [name] omeprazole (PRILOSEC) 40 MG capsule Take 1 capsule (40 mg total) by mouth daily.  30 capsule  12  . potassium chloride SA (K-DUR,KLOR-CON) 20 MEQ tablet Take 1 tablet (20 mEq total) by mouth daily.  30 tablet  6  . Alum & Mag Hydroxide-Simeth (MAGIC MOUTHWASH W/LIDOCAINE) SOLN Take 5 mLs by mouth 4 (four) times daily as needed.  300 mL  2  . dexamethasone (DECADRON) 4 MG tablet Take 4 mg by mouth daily with breakfast.       . prochlorperazine (COMPAZINE) 10 MG tablet Take 1 tablet (10 mg total) by mouth  every 6 (six) hours as needed.  30 tablet  2    OBJECTIVE: [name] who appears slightly tired Filed Vitals:   12/17/11 1042  BP: 120/74  Pulse: 116  Temp: 98.4 F (36.9 C)     Body mass index is 41.36 kg/(m^2).    ECOG FS: 1  Filed Weights   12/17/11 1042  Weight: 272 lb (123.378 kg)   Sclerae unicteric Oropharynx clear No cervical or supraclavicular adenopathy Lungs no rales or rhonchi Heart regular rate and rhythm Abd benign MSK no focal spinal tenderness, no peripheral edema Neuro: nonfocal Breasts: I do not palpate any discreet mass in either breast. There are no nipple retraction or skin changes of concern. I do not palpate any axillary adenopathy at present.   LAB RESULTS: Lab Results  Component Value Date   WBC  16.2* 12/17/2011   NEUTROABS 13.4* 12/17/2011   HGB 10.5* 12/17/2011   HCT 32.6* 12/17/2011   MCV 99.1 12/17/2011   PLT 195 12/17/2011      Chemistry      Component Value Date/Time   NA 138 12/09/2011 1053   K 3.7 12/09/2011 1053   CL 103 12/09/2011 1053   CO2 24 12/09/2011 1053   BUN 10 12/09/2011 1053   CREATININE 0.52 12/09/2011 1053      Component Value Date/Time   CALCIUM 9.7 12/09/2011 1053   ALKPHOS 95 12/09/2011 1053   AST 15 12/09/2011 1053   ALT 18 12/09/2011 1053   BILITOT 0.4 12/09/2011 1053       Lab Results  Component Value Date   LABCA2 87* 08/06/2011    STUDIES:  Echocardiogram, 12/04/11, EF = 55-60%    ASSESSMENT: 66 y.o.  [place] woman   (1)  status post bilateral breast biopsies 07/24/2011, showing,      (a) on the right, a clinical T2 N0 papillary/ductal breast cancer, estrogen and progesterone receptor positive, HER-2 negative, with an MIB-1 of 10%;     (b) on the left, a T3 N1 invasive ductal carcinoma, grade 3, triple positive, with an MIB-1 of 60%.  (2)  Status post 4 dose dense cycles of doxorubicin/ cyclophosphamide followed by 4 dose dense cycles of paclitaxel and trastuzumab completed 12/09/2011, the  trastuzumab to be continued for a total of one year.  (3)  the patient will need postmastectomy radiation at least on the left.  PLAN: Linda [name] Bible did remarkably well with her chemotherapy, and there is no evidence to of and organ damage, particularly neuropathy or heart damage. We are continuing the trastuzumab to complete a year. She already has an appointment with Dr. Rayburn Ma later this week and she will return to see me July 30, when she will also receive trastuzumab, to discuss her final pathology results. She knows to call for any problems that may develop before the next visit.  Duvid Smalls C    12/17/2011

## 2011-12-19 ENCOUNTER — Ambulatory Visit (INDEPENDENT_AMBULATORY_CARE_PROVIDER_SITE_OTHER): Payer: Medicare Other | Admitting: Surgery

## 2011-12-19 ENCOUNTER — Encounter (INDEPENDENT_AMBULATORY_CARE_PROVIDER_SITE_OTHER): Payer: Self-pay | Admitting: Surgery

## 2011-12-19 VITALS — BP 144/86 | HR 93 | Temp 97.0°F | Resp 18 | Ht 68.0 in | Wt 272.8 lb

## 2011-12-19 DIAGNOSIS — C50919 Malignant neoplasm of unspecified site of unspecified female breast: Secondary | ICD-10-CM

## 2011-12-19 NOTE — Progress Notes (Signed)
Subjective:     Patient ID: Linda [name], female   DOB: 1945/09/27, 65 y.o.   MRN: [id number]  HPI  She is here for a followup visit for bilateral breast cancers. She has now finished chemotherapy and is ready to schedule her bilateral mastectomies Review of Systems     Objective:   Physical Exam On exam, there are no palpable masses and no adenopathy on the right. There is skin dimpling at the areola and chronic skin changes with scarring in the left breast but no palpable adenopathy.     Again, her MRI showed drastic improvement Assessment:     Bilateral breast cancers ready for definitive surgery.    Plan:     She will need postop radiation so any kind of reconstruction will have to be delayed.  She will now be scheduled for right mastectomy and sentinel lymph node biopsy as well as left modified radical mastectomy

## 2011-12-23 ENCOUNTER — Ambulatory Visit (HOSPITAL_BASED_OUTPATIENT_CLINIC_OR_DEPARTMENT_OTHER): Payer: Medicare Other

## 2011-12-23 ENCOUNTER — Other Ambulatory Visit (HOSPITAL_BASED_OUTPATIENT_CLINIC_OR_DEPARTMENT_OTHER): Payer: Medicare Other | Admitting: Lab

## 2011-12-23 ENCOUNTER — Other Ambulatory Visit: Payer: Self-pay | Admitting: Oncology

## 2011-12-23 VITALS — BP 132/77 | HR 94 | Temp 98.5°F

## 2011-12-23 DIAGNOSIS — C50019 Malignant neoplasm of nipple and areola, unspecified female breast: Secondary | ICD-10-CM

## 2011-12-23 DIAGNOSIS — C50519 Malignant neoplasm of lower-outer quadrant of unspecified female breast: Secondary | ICD-10-CM

## 2011-12-23 DIAGNOSIS — C773 Secondary and unspecified malignant neoplasm of axilla and upper limb lymph nodes: Secondary | ICD-10-CM

## 2011-12-23 DIAGNOSIS — C801 Malignant (primary) neoplasm, unspecified: Secondary | ICD-10-CM

## 2011-12-23 DIAGNOSIS — C50119 Malignant neoplasm of central portion of unspecified female breast: Secondary | ICD-10-CM

## 2011-12-23 DIAGNOSIS — Z5112 Encounter for antineoplastic immunotherapy: Secondary | ICD-10-CM

## 2011-12-23 LAB — CBC WITH DIFFERENTIAL/PLATELET
BASO%: 0.3 % (ref 0.0–2.0)
HCT: 31.5 % — ABNORMAL LOW (ref 34.8–46.6)
LYMPH%: 11.4 % — ABNORMAL LOW (ref 14.0–49.7)
MCHC: 32.4 g/dL (ref 31.5–36.0)
MONO#: 0.7 10*3/uL (ref 0.1–0.9)
NEUT%: 74.5 % (ref 38.4–76.8)
Platelets: 179 10*3/uL (ref 145–400)
WBC: 6 10*3/uL (ref 3.9–10.3)

## 2011-12-23 MED ORDER — TRASTUZUMAB CHEMO INJECTION 440 MG
6.0000 mg/kg | Freq: Once | INTRAVENOUS | Status: AC
  Administered 2011-12-23: 735 mg via INTRAVENOUS
  Filled 2011-12-23: qty 35

## 2011-12-23 MED ORDER — ACETAMINOPHEN 325 MG PO TABS
650.0000 mg | ORAL_TABLET | Freq: Once | ORAL | Status: AC
  Administered 2011-12-23: 650 mg via ORAL

## 2011-12-23 MED ORDER — SODIUM CHLORIDE 0.9 % IV SOLN
Freq: Once | INTRAVENOUS | Status: AC
  Administered 2011-12-23: 10:00:00 via INTRAVENOUS

## 2011-12-23 MED ORDER — DIPHENHYDRAMINE HCL 25 MG PO CAPS
25.0000 mg | ORAL_CAPSULE | Freq: Once | ORAL | Status: AC
  Administered 2011-12-23: 25 mg via ORAL

## 2012-01-12 ENCOUNTER — Other Ambulatory Visit: Payer: Self-pay | Admitting: *Deleted

## 2012-01-13 ENCOUNTER — Other Ambulatory Visit: Payer: Medicare Other | Admitting: Lab

## 2012-01-13 ENCOUNTER — Telehealth: Payer: Self-pay | Admitting: *Deleted

## 2012-01-13 ENCOUNTER — Ambulatory Visit (HOSPITAL_BASED_OUTPATIENT_CLINIC_OR_DEPARTMENT_OTHER): Payer: Medicare Other | Admitting: Oncology

## 2012-01-13 ENCOUNTER — Ambulatory Visit (HOSPITAL_BASED_OUTPATIENT_CLINIC_OR_DEPARTMENT_OTHER): Payer: Medicare Other

## 2012-01-13 ENCOUNTER — Other Ambulatory Visit (HOSPITAL_BASED_OUTPATIENT_CLINIC_OR_DEPARTMENT_OTHER): Payer: Medicare Other | Admitting: Lab

## 2012-01-13 VITALS — BP 140/85 | HR 94 | Temp 98.4°F | Ht 68.0 in | Wt 277.8 lb

## 2012-01-13 DIAGNOSIS — C50119 Malignant neoplasm of central portion of unspecified female breast: Secondary | ICD-10-CM

## 2012-01-13 DIAGNOSIS — C773 Secondary and unspecified malignant neoplasm of axilla and upper limb lymph nodes: Secondary | ICD-10-CM

## 2012-01-13 DIAGNOSIS — C50519 Malignant neoplasm of lower-outer quadrant of unspecified female breast: Secondary | ICD-10-CM

## 2012-01-13 DIAGNOSIS — Z5112 Encounter for antineoplastic immunotherapy: Secondary | ICD-10-CM

## 2012-01-13 DIAGNOSIS — C801 Malignant (primary) neoplasm, unspecified: Secondary | ICD-10-CM

## 2012-01-13 DIAGNOSIS — D0590 Unspecified type of carcinoma in situ of unspecified breast: Secondary | ICD-10-CM

## 2012-01-13 LAB — CBC WITH DIFFERENTIAL/PLATELET
EOS%: 5.4 % (ref 0.0–7.0)
LYMPH%: 22.7 % (ref 14.0–49.7)
MCH: 31.4 pg (ref 25.1–34.0)
MCHC: 32.9 g/dL (ref 31.5–36.0)
MCV: 95.6 fL (ref 79.5–101.0)
MONO%: 11 % (ref 0.0–14.0)
RBC: 3.63 10*6/uL — ABNORMAL LOW (ref 3.70–5.45)
RDW: 14.9 % — ABNORMAL HIGH (ref 11.2–14.5)
nRBC: 0 % (ref 0–0)

## 2012-01-13 LAB — COMPREHENSIVE METABOLIC PANEL
ALT: 19 U/L (ref 0–35)
Alkaline Phosphatase: 89 U/L (ref 39–117)
Creatinine, Ser: 0.61 mg/dL (ref 0.50–1.10)
Sodium: 139 mEq/L (ref 135–145)
Total Bilirubin: 0.4 mg/dL (ref 0.3–1.2)
Total Protein: 6.4 g/dL (ref 6.0–8.3)

## 2012-01-13 MED ORDER — SODIUM CHLORIDE 0.9 % IV SOLN
Freq: Once | INTRAVENOUS | Status: AC
  Administered 2012-01-13: 13:00:00 via INTRAVENOUS

## 2012-01-13 MED ORDER — SODIUM CHLORIDE 0.9 % IJ SOLN
10.0000 mL | INTRAMUSCULAR | Status: DC | PRN
  Administered 2012-01-13: 10 mL
  Filled 2012-01-13: qty 10

## 2012-01-13 MED ORDER — TRASTUZUMAB CHEMO INJECTION 440 MG
6.0000 mg/kg | Freq: Once | INTRAVENOUS | Status: AC
  Administered 2012-01-13: 735 mg via INTRAVENOUS
  Filled 2012-01-13: qty 35

## 2012-01-13 MED ORDER — ACETAMINOPHEN 325 MG PO TABS
650.0000 mg | ORAL_TABLET | Freq: Once | ORAL | Status: AC
  Administered 2012-01-13: 650 mg via ORAL

## 2012-01-13 MED ORDER — HEPARIN SOD (PORK) LOCK FLUSH 100 UNIT/ML IV SOLN
500.0000 [IU] | Freq: Once | INTRAVENOUS | Status: AC | PRN
  Administered 2012-01-13: 500 [IU]
  Filled 2012-01-13: qty 5

## 2012-01-13 MED ORDER — DIPHENHYDRAMINE HCL 25 MG PO CAPS
25.0000 mg | ORAL_CAPSULE | Freq: Once | ORAL | Status: AC
Start: 2012-01-13 — End: 2012-01-13
  Administered 2012-01-13: 25 mg via ORAL

## 2012-01-13 NOTE — Telephone Encounter (Signed)
Sent michelle email to set up patient's treatment up starting at 02-03-2012 every 21 days times six

## 2012-01-13 NOTE — Progress Notes (Signed)
ID: Linda [name]   DOB: 1946-03-07  MR#: [id number]  WGN#:[id number]  HISTORY OF PRESENT ILLNESS: The patient noted a small amount of drainage from her left breast December of 2012. She brought it to her gynecologist's attention in January of 2013 and was set up for bilateral diagnostic mammography at the [facility] Center July 24, 2011. This was the patient's first ever mammogram. It showed a large irregular mass in the left retroareolar region extending to the nipple, with nipple retraction and skin thickening. This measured approximately 8.4 cm. It was associated with pleomorphic microcalcifications. By exam there was moderate distortion and retraction of the nipple with a large palpable ill-defined area in the retroareolar region. In the right right breast there was also a 2 cm hard mass palpated. Ultrasound of the right breast mass showed a complex cystic/solid area measuring 1.9 cm. Ultrasound of the right axilla was negative. Ultrasound of the left breast showed a large hypoechoic mass measuring at least 3.8 cm. The left axilla showed several adjacent abnormal appearing lymph nodes.  With this information biopsy of the right and left breast masses were obtained the same day, and showed ([id number])  (a) on the right, and invasive ductal carcinoma with papillary features, estrogen and progesterone receptor positive, HER-2 negative, with an MIB-1 of 10%.  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 The MIB-1 was 60% for the left-sided tumor.  With this information the patient was presented at the multidisciplinary breast cancer conference 08/06/2011. Subsequent evaluation and treatments are as detailed below.  INTERVAL HISTORY: Linda [name] returns today with her older son Linda Hua for follow up of her bilateral breast carcinomas. She is scheduled for her definitive surgery next week.  REVIEW OF  SYSTEMS: Even though she did quite well with chemotherapy, she is doing "a lot better" now. She has more energy. She was able to walk for 45 minutes with her younger son while looking for a car for him. She has good appetite, no taste per version, no peripheral neuropathy, no hot flashes. A detailed review of systems was entirely negative.  PAST MEDICAL HISTORY: Past Medical History  Diagnosis Date  . Cancer 08-13-11    07-31-11-Dx. Bilarteral Breast cancer-left greater than rt.  . Hematuria, undiagnosed cause 08-13-11    Being evaluated by [facility] 08-14-11    PAST SURGICAL HISTORY: Past Surgical History  Procedure Date  . Child birth 08-13-11    x3 -NVD  . Portacath placement 08/15/2011    Procedure: INSERTION PORT-A-CATH;  Surgeon: Shelly Rubenstein, MD;  Location: [facility];  Service: General;  Laterality: N/A;    FAMILY HISTORY Family History  Problem Relation Age of Onset  . Heart disease Mother   . Cancer Mother     breast  . Heart attack Father   . Cancer Sister     breast  The patient's father died from a heart attack at the age of 66. The patient's mother died from apparently heart problems at the age of 70. The patient had no brothers. She has 3 sisters. One of her sisters was diagnosed with breast cancer in her mid 3s. The patient does not know if his sister was ever genetically tested. The patient's mother had a mastectomy at the age of 37, presumably for breast cancer. There is no other history of breast or in cancer in the family to her knowledge.   GYNECOLOGIC HISTORY:  She  does not recall when she had menarche. She had her first child at age 27. She is GX P3. She underwent menopause in her mid 61s. She never took hormone replacement.   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 She moved to [place]  about 2 years ago but has a Museum/gallery curator in [place]. Son Linda [name] lives in [place] and works as a IT sales professional. His wife is a Engineer, civil (consulting). Son Linda Hua lives at [employer] and is a Art therapist in addition to having the [employer]. The patient attends a local [employer] here   ADVANCED DIRECTIVES: Not in place  HEALTH MAINTENANCE: History  Substance Use Topics  . Smoking status: Never Smoker   . Smokeless tobacco: Never Used  . Alcohol Use: Yes     rare- occ.     Colonoscopy: Never  PAP: Feb 2013  Bone density: Never  Lipid panel:  Allergies  Allergen Reactions  . Ace Inhibitors Cough    Current Outpatient Prescriptions  Medication Sig Dispense Refill  . b complex vitamins tablet Take 1 tablet by mouth daily.      . carvedilol (COREG) 3.125 MG tablet Take 1 tablet (3.125 mg total) by mouth 2 (two) times daily with a meal.  60 tablet  3  . KLOR-CON M20 20 MEQ tablet TAKE 1 TABLET BY MOUTH ONCE DAILY AS NEEDED  30 tablet  1  . losartan-hydrochlorothiazide (HYZAAR) 50-12.5 MG per tablet Take 1 tablet by mouth daily.  30 tablet  3  . Multiple Vitamin (MULTIVITAMIN) capsule Take 1 capsule by mouth daily. States only daily-will stop today.      . Alum & Mag Hydroxide-Simeth (MAGIC MOUTHWASH W/LIDOCAINE) SOLN Take 5 mLs by mouth 4 (four) times daily as needed.  300 mL  2  . LORazepam (ATIVAN) 0.5 MG tablet Take 0.5 mg by mouth every 8 (eight) hours.      . Multiple Vitamins-Iron (MULTIVITAMINS WITH IRON) TABS Take 1 tablet by mouth daily.      [name] omeprazole (PRILOSEC) 40 MG capsule Take 1 capsule (40 mg total) by mouth daily.  30 capsule  12  . prochlorperazine (COMPAZINE) 10 MG tablet Take 1 tablet (10 mg total) by mouth every 6 (six) hours as needed.  30 tablet  2  . DISCONTD: potassium chloride SA (K-DUR,KLOR-CON) 20 MEQ tablet Take 1 tablet (20 mEq total) by mouth daily.  30 tablet  6    OBJECTIVE: [name] in no acute distress Filed Vitals:   01/13/12  1145  BP: 140/85  Pulse: 94  Temp: 98.4 F (36.9 C)     Body mass index is 42.24 kg/(m^2).    ECOG FS: 1  Filed Weights   01/13/12 1145  Weight: 277 lb 12.8 oz (126.009 kg)   Sclerae unicteric Oropharynx clear No cervical or supraclavicular adenopathy Lungs no rales or rhonchi Heart regular rate and rhythm Abd benign MSK no focal spinal tenderness, 1+ bilateral ankle edema Neuro: nonfocal Breasts: The right breast is unremarkable. The area around the left nipple where she had visible tumor previously he still pending, but now flat, without palpable mass. There is no palpable left axillary adenopathy.   LAB RESULTS: Lab Results  Component Value Date   WBC 5.3 01/13/2012   NEUTROABS 3.2 01/13/2012   HGB  11.4* 01/13/2012   HCT 34.7* 01/13/2012   MCV 95.6 01/13/2012   PLT 219 01/13/2012      Chemistry      Component Value Date/Time   NA 138 12/09/2011 1053   K 3.7 12/09/2011 1053   CL 103 12/09/2011 1053   CO2 24 12/09/2011 1053   BUN 10 12/09/2011 1053   CREATININE 0.52 12/09/2011 1053      Component Value Date/Time   CALCIUM 9.7 12/09/2011 1053   ALKPHOS 95 12/09/2011 1053   AST 15 12/09/2011 1053   ALT 18 12/09/2011 1053   BILITOT 0.4 12/09/2011 1053       Lab Results  Component Value Date   LABCA2 87* 08/06/2011    STUDIES:  Echocardiogram, 12/04/11, EF = 55-60%    ASSESSMENT: 66 y.o.  [place] woman   (1)  status post bilateral breast biopsies 07/24/2011, showing,      (a) on the right, a clinical T2 N0 papillary/ductal breast cancer, estrogen and progesterone receptor positive, HER-2 negative, with an MIB-1 of 10%;     (b) on the left, a T3 N1 invasive ductal carcinoma, grade 3, triple positive, with an MIB-1 of 60%.  (2)  Status post 4 dose dense cycles of doxorubicin/ cyclophosphamide followed by 4 dose dense cycles of paclitaxel and trastuzumab completed 12/09/2011, the trastuzumab to be continued for a total of one year.  (3)  the patient will need  postmastectomy radiation at least on the left.  PLAN: She will have her surgery next week and see Dr. Mitzi Hansen late August to start planning radiation. She will see me early September. At that time we will go over her pathology report in more detail. She will need a repeat echocardiogram in September. Once she finishes radiation we will likely start an aromatase inhibitor. In the meantime, I suggested she try compression stockings for her mild bilateral ankle edema. She knows to call for any problems that may develop before the next visit.   Emmett Bracknell C    01/13/2012

## 2012-01-13 NOTE — Patient Instructions (Addendum)
[facility] Discharge Instructions for Patients Receiving Chemotherapy  Today you received the following chemotherapy agents :  Herceptin.  To help prevent nausea and vomiting after your treatment, we encourage you to take your nausea medication as instructed by your physician.    If you develop nausea and vomiting that is not controlled by your nausea medication, call the clinic. If it is after clinic hours your family physician or the after hours number for the clinic or go to the Emergency Department.   BELOW ARE SYMPTOMS THAT SHOULD BE REPORTED IMMEDIATELY:  *FEVER GREATER THAN 100.5 F  *CHILLS WITH OR WITHOUT FEVER  NAUSEA AND VOMITING THAT IS NOT CONTROLLED WITH YOUR NAUSEA MEDICATION  *UNUSUAL SHORTNESS OF BREATH  *UNUSUAL BRUISING OR BLEEDING  TENDERNESS IN MOUTH AND THROAT WITH OR WITHOUT PRESENCE OF ULCERS  *URINARY PROBLEMS  *BOWEL PROBLEMS  UNUSUAL RASH Items with * indicate a potential emergency and should be followed up as soon as possible.  One of the nurses will contact you 24 hours after your treatment. Please let the nurse know about any problems that you may have experienced. Feel free to call the clinic you have any questions or concerns. The clinic phone number is [phone].   I have been informed and understand all the instructions given to me. I know to contact the clinic, my physician, or go to the Emergency Department if any problems should occur. I do not have any questions at this time, but understand that I may call the clinic during office hours   should I have any questions or need assistance in obtaining follow up care.    __________________________________________  _____________  __________ Signature of Patient or Authorized Representative            Date                   Time    __________________________________________ Nurse's Signature    

## 2012-01-13 NOTE — Telephone Encounter (Signed)
Per staff message and POF I have scheduled appts.  JMW  

## 2012-01-15 ENCOUNTER — Encounter (HOSPITAL_BASED_OUTPATIENT_CLINIC_OR_DEPARTMENT_OTHER): Payer: Self-pay | Admitting: *Deleted

## 2012-01-15 NOTE — Progress Notes (Signed)
Pt to come in for ekg and bmet-to bring all meds,overnight bag All preop teaching done Pt denies any snoring or OSA

## 2012-01-19 ENCOUNTER — Encounter (HOSPITAL_BASED_OUTPATIENT_CLINIC_OR_DEPARTMENT_OTHER)
Admission: RE | Admit: 2012-01-19 | Discharge: 2012-01-19 | Disposition: A | Discharge status: Home / Self Care | Payer: Medicare Other | Source: Ambulatory Visit | Attending: Surgery | Admitting: Surgery

## 2012-01-19 LAB — BASIC METABOLIC PANEL
Chloride: 101 mEq/L (ref 96–112)
Creatinine, Ser: 0.52 mg/dL (ref 0.50–1.10)
GFR calc Af Amer: >90 mL/min (ref >90)
Potassium: 3.9 mEq/L (ref 3.5–5.1)
Sodium: 138 mEq/L (ref 135–145)

## 2012-01-20 NOTE — H&P (Signed)
Linda [name] is an 66 y.o. female.   Chief Complaint: bilateral breast cancer HPI: she is status post neoadjuvant therapy for bilateral breast cancers. She now presents for left modified radical mastectomy as well as a right simple mastectomy and sentinel lymph node biopsy. She is currently doing well.  Past Medical History  Diagnosis Date  . Cancer 08-13-11    07-31-11-Dx. Bilarteral Breast cancer-left greater than rt.  . Hematuria, undiagnosed cause 08-13-11    Being evaluated by [facility] 08-14-11  . Hypertension   . Bronchitis     hx  . Seasonal allergies   . GERD (gastroesophageal reflux disease)     doing well  . Hematuria - cause not known     Past Surgical History  Procedure Date  . Child birth 08-13-11    x3 -NVD  . Portacath placement 08/15/2011    Procedure: INSERTION PORT-A-CATH;  Surgeon: Shelly Rubenstein, MD;  Location: [facility];  Service: General;  Laterality: N/A;    Family History  Problem Relation Age of Onset  . Heart disease Mother   . Cancer Mother     breast  . Heart attack Father   . Cancer Sister     breast   Social History:  reports that she has never smoked. She has never used smokeless tobacco. She reports that she drinks alcohol. She reports that she does not use illicit drugs.  Allergies:  Allergies  Allergen Reactions  . Ace Inhibitors Cough    No prescriptions prior to admission    Results for orders placed during the hospital encounter of 01/21/12 (from the past 48 hour(s))  BASIC METABOLIC PANEL     Status: Abnormal   Collection Time   01/19/12 11:30 AM      Component Value Range Comment   Sodium 138  135 - 145 mEq/L    Potassium 3.9  3.5 - 5.1 mEq/L    Chloride 101  96 - 112 mEq/L    CO2 23  19 - 32 mEq/L    Glucose, Bld 177 (*) 70 - 99 mg/dL    BUN 13  6 - 23 mg/dL    Creatinine, Ser 1.61  0.50 - 1.10 mg/dL    Calcium 9.6  8.4 - 09.6 mg/dL    GFR calc non Af Amer >90  >90 mL/min    GFR calc Af Amer >90  >90 mL/min     No results found.  Review of Systems  All other systems reviewed and are negative.    Height 5\' 8"  (1.727 m), weight 277 lb (125.646 kg). Physical Exam  Constitutional: She is oriented to person, place, and time. She appears well-nourished.  HENT:  Head: Normocephalic and atraumatic.  Right Ear: External ear normal.  Left Ear: External ear normal.  Eyes: Conjunctivae are normal. Pupils are equal, round, and reactive to light.  Neck: Normal range of motion. Neck supple. No tracheal deviation present.  Cardiovascular: Normal rate, regular rhythm and normal heart sounds.   No murmur heard. Respiratory: Effort normal and breath sounds normal. No respiratory distress. She has no wheezes.  GI: Soft. Bowel sounds are normal. She exhibits no distension.  Musculoskeletal: Normal range of motion.  Neurological: She is alert and oriented to person, place, and time.  Skin: Skin is warm and dry. No rash noted. No erythema.  Psychiatric: Her behavior is normal.     Assessment/Plan Bilateral breast cancer status post neoadjuvant chemotherapy  We will now proceed with a left  modified radical mastectomy and a right simple mastectomy with sentinel lymph node biopsy. The risks were discussed with her in detail. These include but not limited to bleeding, infection, injury to shredding structures including nerves, need for drains, chronic seromas, arm swelling, skin flap necrosis, need for further surgery, et Karie Soda. DVT risk was also discussed. Surgery is scheduled. Likelihood of success is good  Mikyle Sox A 01/20/2012, 11:57 AM

## 2012-01-21 ENCOUNTER — Encounter: Payer: Self-pay | Admitting: Radiation Oncology

## 2012-01-21 ENCOUNTER — Encounter (HOSPITAL_BASED_OUTPATIENT_CLINIC_OR_DEPARTMENT_OTHER): Payer: Self-pay | Admitting: Anesthesiology

## 2012-01-21 ENCOUNTER — Ambulatory Visit (HOSPITAL_BASED_OUTPATIENT_CLINIC_OR_DEPARTMENT_OTHER): Payer: Medicare Other | Admitting: Anesthesiology

## 2012-01-21 ENCOUNTER — Encounter (HOSPITAL_BASED_OUTPATIENT_CLINIC_OR_DEPARTMENT_OTHER): Payer: Self-pay | Admitting: *Deleted

## 2012-01-21 ENCOUNTER — Ambulatory Visit (HOSPITAL_COMMUNITY)
Admission: RE | Admit: 2012-01-21 | Discharge: 2012-01-21 | Disposition: A | Discharge status: Home / Self Care | Payer: Medicare Other | Source: Ambulatory Visit | Attending: Surgery | Admitting: Surgery

## 2012-01-21 ENCOUNTER — Ambulatory Visit (HOSPITAL_BASED_OUTPATIENT_CLINIC_OR_DEPARTMENT_OTHER)
Admission: RE | Admit: 2012-01-21 | Discharge: 2012-01-22 | Disposition: A | Discharge status: Home / Self Care | Payer: Medicare Other | Source: Ambulatory Visit | Attending: Surgery | Admitting: Surgery

## 2012-01-21 ENCOUNTER — Encounter (HOSPITAL_BASED_OUTPATIENT_CLINIC_OR_DEPARTMENT_OTHER)
Admission: RE | Disposition: A | Discharge status: Home / Self Care | Payer: Self-pay | Source: Ambulatory Visit | Attending: Surgery

## 2012-01-21 DIAGNOSIS — C50919 Malignant neoplasm of unspecified site of unspecified female breast: Secondary | ICD-10-CM | POA: Insufficient documentation

## 2012-01-21 DIAGNOSIS — K219 Gastro-esophageal reflux disease without esophagitis: Secondary | ICD-10-CM | POA: Insufficient documentation

## 2012-01-21 DIAGNOSIS — C50119 Malignant neoplasm of central portion of unspecified female breast: Secondary | ICD-10-CM

## 2012-01-21 DIAGNOSIS — I10 Essential (primary) hypertension: Secondary | ICD-10-CM | POA: Insufficient documentation

## 2012-01-21 DIAGNOSIS — Z01812 Encounter for preprocedural laboratory examination: Secondary | ICD-10-CM | POA: Insufficient documentation

## 2012-01-21 HISTORY — DX: Gastro-esophageal reflux disease without esophagitis: K21.9

## 2012-01-21 HISTORY — PX: MASTECTOMY W/ SENTINEL NODE BIOPSY: SHX2001

## 2012-01-21 HISTORY — DX: Other seasonal allergic rhinitis: J30.2

## 2012-01-21 HISTORY — DX: Bronchitis, not specified as acute or chronic: J40

## 2012-01-21 HISTORY — DX: Essential (primary) hypertension: I10

## 2012-01-21 SURGERY — MASTECTOMY WITH SENTINEL LYMPH NODE BIOPSY
Anesthesia: General | Laterality: Bilateral

## 2012-01-21 MED ORDER — ACETAMINOPHEN 10 MG/ML IV SOLN
1000.0000 mg | Freq: Once | INTRAVENOUS | Status: AC
  Administered 2012-01-21: 1000 mg via INTRAVENOUS

## 2012-01-21 MED ORDER — PANTOPRAZOLE SODIUM 40 MG PO TBEC
40.0000 mg | DELAYED_RELEASE_TABLET | Freq: Every day | ORAL | Status: DC
  Administered 2012-01-22: 40 mg via ORAL
  Filled 2012-01-21: qty 1

## 2012-01-21 MED ORDER — POTASSIUM CHLORIDE IN NACL 20-0.9 MEQ/L-% IV SOLN
INTRAVENOUS | Status: DC
  Administered 2012-01-21: 15:00:00 via INTRAVENOUS
  Filled 2012-01-21 (×2): qty 1000

## 2012-01-21 MED ORDER — TECHNETIUM TC 99M SULFUR COLLOID FILTERED
1.0000 | Freq: Once | INTRAVENOUS | Status: AC | PRN
  Administered 2012-01-21: 1 via INTRADERMAL

## 2012-01-21 MED ORDER — DEXAMETHASONE SODIUM PHOSPHATE 4 MG/ML IJ SOLN
INTRAMUSCULAR | Status: DC | PRN
Start: 2012-01-21 — End: 2012-01-21
  Administered 2012-01-21: 10 mg via INTRAVENOUS

## 2012-01-21 MED ORDER — METHYLENE BLUE 1 % INJ SOLN
INTRAMUSCULAR | Status: DC | PRN
  Administered 2012-01-21: 2 mL via SUBMUCOSAL

## 2012-01-21 MED ORDER — MIDAZOLAM HCL 5 MG/5ML IJ SOLN
INTRAMUSCULAR | Status: DC | PRN
  Administered 2012-01-21: 2 mg via INTRAVENOUS

## 2012-01-21 MED ORDER — HYDROCHLOROTHIAZIDE 12.5 MG PO CAPS
12.5000 mg | ORAL_CAPSULE | Freq: Every day | ORAL | Status: DC
  Administered 2012-01-22: 12.5 mg via ORAL
  Filled 2012-01-21: qty 1

## 2012-01-21 MED ORDER — LIDOCAINE HCL (CARDIAC) 20 MG/ML IV SOLN
INTRAVENOUS | Status: DC | PRN
  Administered 2012-01-21: 75 mg via INTRAVENOUS

## 2012-01-21 MED ORDER — MAGIC MOUTHWASH W/LIDOCAINE: 5.0000 mL | Freq: Four times a day (QID) | ORAL | Status: DC | PRN

## 2012-01-21 MED ORDER — CEFAZOLIN SODIUM-DEXTROSE 2-3 GM-% IV SOLR
2.0000 g | INTRAVENOUS | Status: AC
  Administered 2012-01-21: 3 g via INTRAVENOUS

## 2012-01-21 MED ORDER — SCOPOLAMINE 1 MG/3DAYS TD PT72
1.0000 | MEDICATED_PATCH | Freq: Once | TRANSDERMAL | Status: DC
  Administered 2012-01-21: 1.5 mg via TRANSDERMAL

## 2012-01-21 MED ORDER — LOSARTAN POTASSIUM 50 MG PO TABS
50.0000 mg | ORAL_TABLET | Freq: Every day | ORAL | Status: DC
  Administered 2012-01-22: 50 mg via ORAL
  Filled 2012-01-21: qty 1

## 2012-01-21 MED ORDER — LORAZEPAM 0.5 MG PO TABS
0.5000 mg | ORAL_TABLET | Freq: Three times a day (TID) | ORAL | Status: DC
  Administered 2012-01-21 (×2): 0.5 mg via ORAL

## 2012-01-21 MED ORDER — ONDANSETRON HCL 4 MG PO TABS: 4.0000 mg | ORAL_TABLET | Freq: Four times a day (QID) | ORAL | Status: DC | PRN

## 2012-01-21 MED ORDER — LOSARTAN POTASSIUM-HCTZ 50-12.5 MG PO TABS: 1.0000 | ORAL_TABLET | Freq: Every day | ORAL | Status: DC

## 2012-01-21 MED ORDER — MORPHINE SULFATE 4 MG/ML IJ SOLN: 4.0000 mg | INTRAMUSCULAR | Status: DC | PRN

## 2012-01-21 MED ORDER — ENOXAPARIN SODIUM 40 MG/0.4ML ~~LOC~~ SOLN
40.0000 mg | SUBCUTANEOUS | Status: DC
  Filled 2012-01-21: qty 0.4

## 2012-01-21 MED ORDER — METOCLOPRAMIDE HCL 5 MG/ML IJ SOLN
INTRAMUSCULAR | Status: DC | PRN
  Administered 2012-01-21: 10 mg via INTRAVENOUS

## 2012-01-21 MED ORDER — SODIUM CHLORIDE 0.9 % IJ SOLN
INTRAMUSCULAR | Status: DC | PRN
  Administered 2012-01-21: 3 mL

## 2012-01-21 MED ORDER — FENTANYL CITRATE 0.05 MG/ML IJ SOLN
50.0000 ug | INTRAMUSCULAR | Status: DC | PRN
  Administered 2012-01-21: 50 ug via INTRAVENOUS

## 2012-01-21 MED ORDER — SUCCINYLCHOLINE CHLORIDE 20 MG/ML IJ SOLN
INTRAMUSCULAR | Status: DC | PRN
  Administered 2012-01-21: 100 mg via INTRAVENOUS

## 2012-01-21 MED ORDER — PROPOFOL 10 MG/ML IV EMUL
INTRAVENOUS | Status: DC | PRN
  Administered 2012-01-21: 200 mg via INTRAVENOUS

## 2012-01-21 MED ORDER — FENTANYL CITRATE 0.05 MG/ML IJ SOLN
INTRAMUSCULAR | Status: DC | PRN
  Administered 2012-01-21 (×3): 25 ug via INTRAVENOUS
  Administered 2012-01-21: 50 ug via INTRAVENOUS
  Administered 2012-01-21: 25 ug via INTRAVENOUS
  Administered 2012-01-21 (×2): 50 ug via INTRAVENOUS

## 2012-01-21 MED ORDER — ONDANSETRON HCL 4 MG/2ML IJ SOLN: 4.0000 mg | Freq: Four times a day (QID) | INTRAMUSCULAR | Status: DC | PRN

## 2012-01-21 MED ORDER — LACTATED RINGERS IV SOLN
INTRAVENOUS | Status: DC
  Administered 2012-01-21 (×3): via INTRAVENOUS

## 2012-01-21 MED ORDER — CARVEDILOL 3.125 MG PO TABS
3.1250 mg | ORAL_TABLET | Freq: Two times a day (BID) | ORAL | Status: DC
  Administered 2012-01-22: 3.125 mg via ORAL
  Filled 2012-01-21: qty 1

## 2012-01-21 MED ORDER — MIDAZOLAM HCL 2 MG/2ML IJ SOLN
1.0000 mg | INTRAMUSCULAR | Status: DC | PRN
  Administered 2012-01-21: 1 mg via INTRAVENOUS

## 2012-01-21 MED ORDER — CEFAZOLIN SODIUM 1-5 GM-% IV SOLN
1.0000 g | Freq: Four times a day (QID) | INTRAVENOUS | Status: AC
  Administered 2012-01-21 – 2012-01-22 (×3): 1 g via INTRAVENOUS

## 2012-01-21 MED ORDER — HYDROCODONE-ACETAMINOPHEN 5-325 MG PO TABS
1.0000 | ORAL_TABLET | ORAL | Status: DC | PRN
  Administered 2012-01-21 – 2012-01-22 (×3): 2 via ORAL

## 2012-01-21 MED ORDER — ONDANSETRON HCL 4 MG/2ML IJ SOLN
INTRAMUSCULAR | Status: DC | PRN
  Administered 2012-01-21: 4 mg via INTRAVENOUS

## 2012-01-21 SURGICAL SUPPLY — 51 items
APPLIER CLIP 9.375 MED OPEN (MISCELLANEOUS) ×2
BENZOIN TINCTURE PRP APPL 2/3 (GAUZE/BANDAGES/DRESSINGS) ×6 IMPLANT
BINDER BREAST XLRG (GAUZE/BANDAGES/DRESSINGS) ×2 IMPLANT
BLADE HEX COATED 2.75 (ELECTRODE) ×2 IMPLANT
BLADE SURG 15 STRL LF DISP TIS (BLADE) ×2 IMPLANT
BLADE SURG 15 STRL SS (BLADE) ×2
BLADE SURG ROTATE 9660 (MISCELLANEOUS) IMPLANT
CANISTER SUCTION 1200CC (MISCELLANEOUS) ×2 IMPLANT
CHLORAPREP W/TINT 26ML (MISCELLANEOUS) ×4 IMPLANT
CLIP APPLIE 9.375 MED OPEN (MISCELLANEOUS) ×1 IMPLANT
CLOTH BEACON ORANGE TIMEOUT ST (SAFETY) ×2 IMPLANT
COVER MAYO STAND STRL (DRAPES) ×2 IMPLANT
COVER PROBE W GEL 5X96 (DRAPES) ×2 IMPLANT
COVER TABLE BACK 60X90 (DRAPES) ×2 IMPLANT
DECANTER SPIKE VIAL GLASS SM (MISCELLANEOUS) IMPLANT
DERMABOND ADVANCED (GAUZE/BANDAGES/DRESSINGS) ×3
DERMABOND ADVANCED .7 DNX12 (GAUZE/BANDAGES/DRESSINGS) ×3 IMPLANT
DRAIN CHANNEL 19F RND (DRAIN) ×8 IMPLANT
DRAPE LAPAROSCOPIC ABDOMINAL (DRAPES) ×2 IMPLANT
DRAPE UTILITY XL STRL (DRAPES) ×2 IMPLANT
ELECT REM PT RETURN 9FT ADLT (ELECTROSURGICAL) ×2
ELECTRODE REM PT RTRN 9FT ADLT (ELECTROSURGICAL) ×1 IMPLANT
EVACUATOR SILICONE 100CC (DRAIN) ×8 IMPLANT
GAUZE SPONGE 4X4 12PLY STRL LF (GAUZE/BANDAGES/DRESSINGS) IMPLANT
GLOVE BIO SURGEON STRL SZ 6.5 (GLOVE) ×2 IMPLANT
GLOVE INDICATOR 7.0 STRL GRN (GLOVE) ×2 IMPLANT
GLOVE SURG SIGNA 7.5 PF LTX (GLOVE) ×2 IMPLANT
GOWN PREVENTION PLUS XLARGE (GOWN DISPOSABLE) ×2 IMPLANT
GOWN PREVENTION PLUS XXLARGE (GOWN DISPOSABLE) ×2 IMPLANT
HEMOSTAT SURGICEL 2X14 (HEMOSTASIS) IMPLANT
NDL SAFETY ECLIPSE 18X1.5 (NEEDLE) ×1 IMPLANT
NEEDLE HYPO 18GX1.5 SHARP (NEEDLE) ×1
NEEDLE HYPO 25X1 1.5 SAFETY (NEEDLE) ×2 IMPLANT
NS IRRIG 1000ML POUR BTL (IV SOLUTION) ×6 IMPLANT
PACK BASIN DAY SURGERY FS (CUSTOM PROCEDURE TRAY) ×2 IMPLANT
PENCIL BUTTON HOLSTER BLD 10FT (ELECTRODE) ×2 IMPLANT
PIN SAFETY STERILE (MISCELLANEOUS) ×4 IMPLANT
SLEEVE SCD COMPRESS KNEE MED (MISCELLANEOUS) ×2 IMPLANT
SPONGE LAP 18X18 X RAY DECT (DISPOSABLE) ×4 IMPLANT
STRIP CLOSURE SKIN 1/2X4 (GAUZE/BANDAGES/DRESSINGS) ×6 IMPLANT
SUT ETHILON 3 0 PS 1 (SUTURE) ×4 IMPLANT
SUT MNCRL AB 4-0 PS2 18 (SUTURE) ×4 IMPLANT
SUT VIC AB 3-0 SH 27 (SUTURE) ×5
SUT VIC AB 3-0 SH 27X BRD (SUTURE) ×5 IMPLANT
SYR BULB 3OZ (MISCELLANEOUS) ×2 IMPLANT
SYR CONTROL 10ML LL (SYRINGE) ×2 IMPLANT
TOWEL OR 17X24 6PK STRL BLUE (TOWEL DISPOSABLE) ×2 IMPLANT
TOWEL OR NON WOVEN STRL DISP B (DISPOSABLE) ×2 IMPLANT
TUBE CONNECTING 20X1/4 (TUBING) ×2 IMPLANT
WATER STERILE IRR 1000ML POUR (IV SOLUTION) IMPLANT
YANKAUER SUCT BULB TIP NO VENT (SUCTIONS) ×2 IMPLANT

## 2012-01-21 NOTE — Anesthesia Procedure Notes (Addendum)
Performed by: Zenia Resides D   Procedure Name: Intubation Date/Time: 01/21/2012 10:40 AM Performed by: Zenia Resides D Pre-anesthesia Checklist: Patient identified, Emergency Drugs available, Suction available and Patient being monitored Patient Re-evaluated:Patient Re-evaluated prior to inductionOxygen Delivery Method: Circle System Utilized Preoxygenation: Pre-oxygenation with 100% oxygen Intubation Type: IV induction Ventilation: Mask ventilation without difficulty Laryngoscope Size: Mac and 3 Grade View: Grade II Tube type: Oral Number of attempts: 1 Airway Equipment and Method: stylet and oral airway Placement Confirmation: ETT inserted through vocal cords under direct vision,  positive ETCO2 and breath sounds checked- equal and bilateral Secured at: 21 cm Tube secured with: Tape Dental Injury: Teeth and Oropharynx as per pre-operative assessment

## 2012-01-21 NOTE — Progress Notes (Signed)
Post right sentinal node injection pt. Alert and oriented vss no c/o pain

## 2012-01-21 NOTE — Interval H&P Note (Signed)
History and Physical Interval Note: no change in H and P  01/21/2012 10:08 AM  Linda [name]  has presented today for surgery, with the diagnosis of Bilateral breast cancer  The various methods of treatment have been discussed with the patient and family. After consideration of risks, benefits and other options for treatment, the patient has consented to  Procedure(s) (LRB): MASTECTOMY WITH SENTINEL LYMPH NODE BIOPSY (Bilateral) as a surgical intervention .  The patient's history has been reviewed, patient examined, no change in status, stable for surgery.  I have reviewed the patient's chart and labs.  Questions were answered to the patient's satisfaction.     Doloros Kwolek A

## 2012-01-21 NOTE — Anesthesia Postprocedure Evaluation (Signed)
Anesthesia Post Note  Patient: Linda [name]  Procedure(s) Performed: Procedure(s) (LRB): MASTECTOMY WITH SENTINEL LYMPH NODE BIOPSY (Bilateral)  Anesthesia type: General  Patient location: PACU  Post pain: Pain level controlled  Post assessment: Patient's Cardiovascular Status Stable  Last Vitals:  Filed Vitals:   01/21/12 1330  BP: 124/71  Pulse: 83  Temp:   Resp: 17    Post vital signs: Reviewed and stable  Level of consciousness: alert  Complications: No apparent anesthesia complications

## 2012-01-21 NOTE — Transfer of Care (Signed)
Immediate Anesthesia Transfer of Care Note  Patient: Linda [name]  Procedure(s) Performed: Procedure(s) (LRB): MASTECTOMY WITH SENTINEL LYMPH NODE BIOPSY (Bilateral)  Patient Location: PACU  Anesthesia Type: General  Level of Consciousness: awake, alert  and oriented  Airway & Oxygen Therapy: Patient Spontanous Breathing and Patient connected to face mask oxygen  Post-op Assessment: Report given to PACU RN and Post -op Vital signs reviewed and stable  Post vital signs: Reviewed and stable  Complications: No apparent anesthesia complications

## 2012-01-21 NOTE — Op Note (Signed)
MASTECTOMY WITH SENTINEL LYMPH NODE BIOPSY  Procedure Note  ANGLE DIRUSSO 01/21/2012   Pre-op Diagnosis: Bilateral breast cancer     Post-op Diagnosis: same   Procedure(s): RIGHT SIMPLE MASTECTOMY WITH SENTINEL LYMPH NODE BIOPSY LEFT MODIFIED RADICAL MASTECTOMY  Surgeon(s): Shelly Rubenstein, MD  Anesthesia: General  Staff:  Raliegh Scarlet, RN - Scrub Person Macie Burows Ricks, RN - Circulator Vernie Ammons Bouchillon, RN - Relief Circulator  Estimated Blood Loss: Minimal               Specimens: BILATERAL BREASTS, RIGHT SENTINEL NODES X 5, COMPLETE LEFT AXILLA          Castle Lamons A   Date: 01/21/2012  Time: 1:03 PM

## 2012-01-21 NOTE — Anesthesia Preprocedure Evaluation (Signed)
Anesthesia Evaluation  Patient identified by MRN, date of birth, ID band Patient awake    Reviewed: Allergy & Precautions, H&P , NPO status , Patient's Chart, lab work & pertinent test results, reviewed documented beta blocker date and time   Airway Mallampati: II TM Distance: >3 FB Neck ROM: full    Dental   Pulmonary neg pulmonary ROS,  breath sounds clear to auscultation        Cardiovascular hypertension, Pt. on medications and Pt. on home beta blockers Rhythm:regular     Neuro/Psych negative neurological ROS  negative psych ROS   GI/Hepatic Neg liver ROS, GERD-  Medicated and Controlled,  Endo/Other  Morbid obesity  Renal/GU negative Renal ROS  negative genitourinary   Musculoskeletal   Abdominal   Peds  Hematology negative hematology ROS (+)   Anesthesia Other Findings See surgeon's H&P   Reproductive/Obstetrics negative OB ROS                           Anesthesia Physical Anesthesia Plan  ASA: III  Anesthesia Plan: General   Post-op Pain Management:    Induction: Intravenous  Airway Management Planned: Oral ETT  Additional Equipment:   Intra-op Plan:   Post-operative Plan: Extubation in OR  Informed Consent: I have reviewed the patients History and Physical, chart, labs and discussed the procedure including the risks, benefits and alternatives for the proposed anesthesia with the patient or authorized representative who has indicated his/her understanding and acceptance.   Dental Advisory Given  Plan Discussed with: CRNA and Surgeon  Anesthesia Plan Comments:         Anesthesia Quick Evaluation

## 2012-01-22 MED ORDER — HYDROCODONE-ACETAMINOPHEN 5-325 MG PO TABS: 1.0000 | ORAL_TABLET | ORAL | Status: AC | PRN

## 2012-01-22 NOTE — Progress Notes (Signed)
Patient ID: Linda [name], female   DOB: 12/01/1945, 66 y.o.   MRN: [id number] POD#1 No complaints Doing well No hematomas  Plan: discharge

## 2012-01-22 NOTE — Discharge Summary (Signed)
Physician Discharge Summary  Patient ID: Linda [name] MRN: [id number] DOB/AGE: Feb 14, 1946 66 y.o.  Admit date: 01/21/2012 Discharge date: 01/22/2012  Admission Diagnoses: bilateral breast cancer  Discharge Diagnoses: same Active Problems:  * No active hospital problems. *    Discharged Condition: good  Hospital Course: uneventful post op course s/p bilat mastectomy.  Discharged home pod#1  Consults: None  Significant Diagnostic Studies:   Treatments: surgery: bilateral mastectomy  Discharge Exam: Blood pressure 146/78, pulse 76, temperature 97.7 F (36.5 C), temperature source Oral, resp. rate 16, height 5\' 8"  (1.727 m), weight 273 lb 6.4 oz (124.013 kg), SpO2 97.00%. Looks good Flaps viable Drains serosang  Disposition: 01-Home or Self Care  Discharge Orders    Future Appointments: Provider: Department: Dept Phone: Center:   02/03/2012 11:15 AM Windell Hummingbird [facility] [phone] None   02/03/2012 11:45 AM [facility] [phone] None   02/04/2012 1:40 PM Shelly Rubenstein, MD [facility] [phone] None   02/09/2012 3:00 PM [facility] [phone] None   02/09/2012 3:30 PM Jonna Coup, MD [facility] [phone] None   02/17/2012 2:30 PM Lowella Dell, MD [facility] [phone] None   02/24/2012 11:15 AM Dava Najjar Idelle Jo [facility] [phone] None   02/24/2012 11:45 AM [facility] G24 [facility] [phone] None   03/16/2012 11:15 AM Radene Gunning [facility] [phone] None   03/16/2012 11:45 AM [facility] D11 [facility] [phone] None   04/06/2012 11:30 AM Windell Hummingbird [facility] [phone] None   04/06/2012 12:00 PM [facility] C10 [facility] [phone] None   04/27/2012 11:30 AM Windell Hummingbird [facility] [phone] None   04/27/2012 12:00 PM [facility] A1 [facility] [phone] None   05/18/2012 11:30 AM Krista Blue [facility]  [phone] None   05/18/2012 12:00 PM [facility] C10 [facility] [phone] None     Future Orders Please Complete By Expires   Discharge patient        Medication List  As of 01/22/2012  7:07 AM   TAKE these medications         b complex vitamins tablet   Take 1 tablet by mouth daily.      carvedilol 3.125 MG tablet   Commonly known as: COREG   Take 1 tablet (3.125 mg total) by mouth 2 (two) times daily with a meal.      HYDROcodone-acetaminophen 5-325 MG per tablet   Commonly known as: NORCO/VICODIN   Take 1-2 tablets by mouth every 4 (four) hours as needed.      KLOR-CON M20 20 MEQ tablet   Generic drug: potassium chloride SA   TAKE 1 TABLET BY MOUTH ONCE DAILY AS NEEDED      LORazepam 0.5 MG tablet   Commonly known as: ATIVAN   Take 0.5 mg by mouth every 8 (eight) hours.      losartan-hydrochlorothiazide 50-12.5 MG per tablet   Commonly known as: HYZAAR   Take 1 tablet by mouth daily.      magic mouthwash w/lidocaine Soln   Take 5 mLs by mouth 4 (four) times daily as needed.      multivitamin capsule   Take 1 capsule by mouth daily. States only daily-will stop today.      multivitamins with iron Tabs   Take 1 tablet by mouth daily.      omeprazole 40 MG capsule   Commonly known as: PRILOSEC   Take 1 capsule (40 mg total) by mouth daily.  prochlorperazine 10 MG tablet   Commonly known as: COMPAZINE   Take 1 tablet (10 mg total) by mouth every 6 (six) hours as needed.           Follow-up Information    Follow up with [facility] A, MD. Call on 02/03/2012. [phone])    Contact information:   [address] [place] [address] [phone]          Signed: Shelly Rubenstein 01/22/2012, 7:07 AM

## 2012-01-22 NOTE — Op Note (Signed)
NAMESEBA, MADOLE NO.:  [id number]  MEDICAL RECORD NO.:  [id number]  LOCATION:                                 FACILITY:  PHYSICIAN:  Abigail Miyamoto, M.D. DATE OF BIRTH:  1946-04-08  DATE OF PROCEDURE:  01/21/2012 DATE OF DISCHARGE:                              OPERATIVE REPORT   PREOPERATIVE DIAGNOSIS:  Bilateral breast cancer.  POSTOPERATIVE DIAGNOSIS:  Bilateral breast cancer.  PROCEDURES: 1. Right simple mastectomy and right axillary sentinel lymph node     biopsy. 2. Left modified radical mastectomy.  SURGEONS:  Abigail Miyamoto, MD.  ANESTHESIA:  General.  ESTIMATED BLOOD LOSS:  Minimal.  INDICATIONS:  This is a 66 year old female who was found to have a cancer in each of her breast.  She has undergone neoadjuvant therapy. She was known to have positive lymph nodes preoperatively in the left axilla.  PROCEDURE IN DETAIL:  The patient was brought to the operating room, having already been injected in the right breast with the radioactive material.  She was placed supine on the operative room table and general anesthesia was induced.  I then injected the blue dye underneath the areola and nipple of the right breast and thoroughly massaged the breast.  Her chest and breast were then prepped and draped in usual sterile fashion.  I performed elliptical incision on the right breast going from medial to lateral incorporating the areola.  I took this down to the breast tissue with the electrocautery.  I then first performed the superior skin flap, then closed the dermis with electrocautery and going all the way to just below the clavicle and the previously placed Port-A-Cath.  I then took the lower flap down to the inframammary ridge. I then slowly removed the breast tissue going medial and laterally off the pectoralis muscle.  Once I reached the axilla, I brought the Neoprobe onto the field, identified several lymph nodes in the axilla. The  first two sentinel lymph nodes were blue and hot.  The remaining 3 sentinel lymph nodes were identified and were not blue, but were hot as well.  Total of 5 sentinel lymph nodes were removed and sent to Pathology for evaluation.  I then completed the mastectomy.  I marked the lateral margin with a suture.  The breast specimen lymph nodes were then sent to Pathology for evaluation.  I irrigated the chest wall with saline.  Hemostasis appeared to be achieved.  I made a separate skin incision and placed a 19-French Blake drain into the incision.  I then closed the incision with interrupted 3-0 Vicryl sutures and running 4-0 Monocryl.  Dermabond and Steri-Strips were then applied.  Next I turned my attention toward the left mastectomy.  I again performed an elliptical incision on the chest, this was much a larger incision because of the involvement of the nipple and areola complex with tumor. I took this down to the breast tissue likewise with the electrocautery. I again performed the superior skin flap first going all the way up to the level of clavicle and then down to the chest wall.  I then performed the inferior skin flap with the electrocautery going down the inframammary ridge.  I then removed the breast tissue from the pectoralis muscle with electrocautery moving medial to lateral.  Once I reached the axilla, a complete axillary dissection was performed.  I identified the axillary vein as well as the long thoracic and thoracodorsal nerves.  The patient had several palpable lymph nodes in the axilla which were quite firm.  I removed all palpable lymph nodes. I could identify sparing the nerves and vascular complexes.  The complete nodal package was removed with the aid of the electrocautery and several surgical clips.  Once the entire nodal package was removed en bloc with the mastectomy specimen, I marked the skin laterally with a suture and sent the left mastectomy to Pathology for  evaluation. Adequate margins appeared to be achieved in the skin and down the chest wall with the mastectomy specimen.  I then irrigated the wound with saline.  I made 2 separate skin incisions and placed a 19-French Blake drain into the axilla and other one in the chest wall.  I then closed the subcutaneous tissue with interrupted 3-0 Vicryl sutures and closed the skin with a running 4-0 Monocryl.  Dermabond, Steri-Strips, gauze, and tape were then applied.  All the drains were placed to suction. Gauze and a binder were then applied.  The patient tolerated the procedure well.  All of the counts were correct at the end of the procedure.  The patient was then extubated in the operating room and taken in stable condition to recovery room.     Abigail Miyamoto, M.D.     DB/MEDQ  D:  01/21/2012  T:  01/21/2012  Job:  [id number]

## 2012-01-23 ENCOUNTER — Encounter (HOSPITAL_BASED_OUTPATIENT_CLINIC_OR_DEPARTMENT_OTHER): Payer: Self-pay | Admitting: Surgery

## 2012-01-29 ENCOUNTER — Telehealth (INDEPENDENT_AMBULATORY_CARE_PROVIDER_SITE_OTHER): Payer: Self-pay | Admitting: General Surgery

## 2012-02-02 ENCOUNTER — Other Ambulatory Visit: Payer: Self-pay | Admitting: *Deleted

## 2012-02-02 DIAGNOSIS — C50119 Malignant neoplasm of central portion of unspecified female breast: Secondary | ICD-10-CM

## 2012-02-03 ENCOUNTER — Other Ambulatory Visit (HOSPITAL_BASED_OUTPATIENT_CLINIC_OR_DEPARTMENT_OTHER): Payer: Medicare Other | Admitting: Lab

## 2012-02-03 ENCOUNTER — Ambulatory Visit (HOSPITAL_BASED_OUTPATIENT_CLINIC_OR_DEPARTMENT_OTHER): Payer: Medicare Other

## 2012-02-03 VITALS — BP 145/89 | HR 99 | Temp 98.1°F | Resp 18

## 2012-02-03 DIAGNOSIS — C50019 Malignant neoplasm of nipple and areola, unspecified female breast: Secondary | ICD-10-CM

## 2012-02-03 DIAGNOSIS — C50119 Malignant neoplasm of central portion of unspecified female breast: Secondary | ICD-10-CM

## 2012-02-03 DIAGNOSIS — C801 Malignant (primary) neoplasm, unspecified: Secondary | ICD-10-CM

## 2012-02-03 DIAGNOSIS — C50519 Malignant neoplasm of lower-outer quadrant of unspecified female breast: Secondary | ICD-10-CM

## 2012-02-03 DIAGNOSIS — Z5112 Encounter for antineoplastic immunotherapy: Secondary | ICD-10-CM

## 2012-02-03 DIAGNOSIS — C773 Secondary and unspecified malignant neoplasm of axilla and upper limb lymph nodes: Secondary | ICD-10-CM

## 2012-02-03 LAB — COMPREHENSIVE METABOLIC PANEL
ALT: 65 U/L — ABNORMAL HIGH (ref 0–35)
AST: 35 U/L (ref 0–37)
BUN: 12 mg/dL (ref 6–23)
CO2: 28 mEq/L (ref 19–32)
Creatinine, Ser: 0.55 mg/dL (ref 0.50–1.10)
Total Bilirubin: 0.2 mg/dL — ABNORMAL LOW (ref 0.3–1.2)

## 2012-02-03 LAB — CBC WITH DIFFERENTIAL/PLATELET
BASO%: 0.1 % (ref 0.0–2.0)
Basophils Absolute: 0 10*3/uL (ref 0.0–0.1)
EOS%: 3.4 % (ref 0.0–7.0)
HCT: 32.7 % — ABNORMAL LOW (ref 34.8–46.6)
LYMPH%: 13.1 % — ABNORMAL LOW (ref 14.0–49.7)
MCH: 30.8 pg (ref 25.1–34.0)
MCHC: 33 g/dL (ref 31.5–36.0)
NEUT%: 74.2 % (ref 38.4–76.8)
Platelets: 252 10*3/uL (ref 145–400)

## 2012-02-03 MED ORDER — TRASTUZUMAB CHEMO INJECTION 440 MG
6.0000 mg/kg | Freq: Once | INTRAVENOUS | Status: AC
  Administered 2012-02-03: 735 mg via INTRAVENOUS
  Filled 2012-02-03: qty 35

## 2012-02-03 MED ORDER — HEPARIN SOD (PORK) LOCK FLUSH 100 UNIT/ML IV SOLN
500.0000 [IU] | Freq: Once | INTRAVENOUS | Status: AC | PRN
  Administered 2012-02-03: 500 [IU]
  Filled 2012-02-03: qty 5

## 2012-02-03 MED ORDER — ACETAMINOPHEN 325 MG PO TABS
650.0000 mg | ORAL_TABLET | Freq: Once | ORAL | Status: AC
  Administered 2012-02-03: 325 mg via ORAL

## 2012-02-03 MED ORDER — SODIUM CHLORIDE 0.9 % IV SOLN
Freq: Once | INTRAVENOUS | Status: AC
  Administered 2012-02-03: 12:00:00 via INTRAVENOUS

## 2012-02-03 MED ORDER — DIPHENHYDRAMINE HCL 25 MG PO CAPS
25.0000 mg | ORAL_CAPSULE | Freq: Once | ORAL | Status: AC
  Administered 2012-02-03: 25 mg via ORAL

## 2012-02-03 MED ORDER — SODIUM CHLORIDE 0.9 % IJ SOLN
10.0000 mL | INTRAMUSCULAR | Status: DC | PRN
  Administered 2012-02-03: 10 mL
  Filled 2012-02-03: qty 10

## 2012-02-03 NOTE — Patient Instructions (Addendum)
[facility] Discharge Instructions for Patients Receiving Chemotherapy  Today you received the following chemotherapy agents Herceptin.  To help prevent nausea and vomiting after your treatment, we encourage you to take your nausea medication as ordered per MD.    If you develop nausea and vomiting that is not controlled by your nausea medication, call the clinic. If it is after clinic hours your family physician or the after hours number for the clinic or go to the Emergency Department.   BELOW ARE SYMPTOMS THAT SHOULD BE REPORTED IMMEDIATELY:  *FEVER GREATER THAN 100.5 F  *CHILLS WITH OR WITHOUT FEVER  NAUSEA AND VOMITING THAT IS NOT CONTROLLED WITH YOUR NAUSEA MEDICATION  *UNUSUAL SHORTNESS OF BREATH  *UNUSUAL BRUISING OR BLEEDING  TENDERNESS IN MOUTH AND THROAT WITH OR WITHOUT PRESENCE OF ULCERS  *URINARY PROBLEMS  *BOWEL PROBLEMS  UNUSUAL RASH Items with * indicate a potential emergency and should be followed up as soon as possible.  . Please let the nurse know about any problems that you may have experienced. Feel free to call the clinic you have any questions or concerns. The clinic phone number is [phone].   I have been informed and understand all the instructions given to me. I know to contact the clinic, my physician, or go to the Emergency Department if any problems should occur. I do not have any questions at this time, but understand that I may call the clinic during office hours   should I have any questions or need assistance in obtaining follow up care.    __________________________________________  _____________  __________ Signature of Patient or Authorized Representative            Date                   Time    __________________________________________ Nurse's Signature    

## 2012-02-04 ENCOUNTER — Encounter (INDEPENDENT_AMBULATORY_CARE_PROVIDER_SITE_OTHER): Payer: Self-pay | Admitting: Surgery

## 2012-02-04 ENCOUNTER — Ambulatory Visit (INDEPENDENT_AMBULATORY_CARE_PROVIDER_SITE_OTHER): Payer: Medicare Other | Admitting: Surgery

## 2012-02-04 VITALS — BP 126/88 | HR 77 | Temp 98.6°F | Resp 18 | Ht 68.0 in | Wt 278.6 lb

## 2012-02-04 DIAGNOSIS — Z09 Encounter for follow-up examination after completed treatment for conditions other than malignant neoplasm: Secondary | ICD-10-CM

## 2012-02-04 DIAGNOSIS — IMO0002 Reserved for concepts with insufficient information to code with codable children: Secondary | ICD-10-CM

## 2012-02-04 HISTORY — DX: Reserved for concepts with insufficient information to code with codable children: IMO0002

## 2012-02-04 NOTE — Progress Notes (Signed)
Subjective:     Patient ID: Linda [name], female   DOB: [birth year], 66 y.o.   MRN: [id number]  HPI She is here for first postop visit status post bilateral mastectomies. Her right drain had fallen out and she has developed a seroma. She's had drainage from both sides and felt as if the erythema the skin with paper tape. She denies fevers.  Review of Systems     Objective:   Physical Exam I went ahead and remove her drains. I drained more than 200 cc of fluid from the right mastectomy site. There is erythema on the right side    Assessment:     Patient status post bilateral mastectomies for bilateral breast cancer    Plan:     I gave her a copy of her pathology report. Both sides showed negative margins and negative nodes. I will place her on Keflex and I renewed her hydrocodone. I will see her back next week to see if the seroma needs to be drained again

## 2012-02-06 ENCOUNTER — Encounter: Payer: Self-pay | Admitting: *Deleted

## 2012-02-06 DIAGNOSIS — IMO0002 Reserved for concepts with insufficient information to code with codable children: Secondary | ICD-10-CM | POA: Insufficient documentation

## 2012-02-06 DIAGNOSIS — C50919 Malignant neoplasm of unspecified site of unspecified female breast: Secondary | ICD-10-CM | POA: Insufficient documentation

## 2012-02-06 NOTE — Progress Notes (Signed)
New consult, Breast Cancer in both Breasts:  Bilateral Mastectomies 01/21/12, Right breast simple mastectomy& right axillary sentinel lymph node bx, Left modified radical mastectomy 02/04/12 s/p visit jp drain had fallen out right breast, seroma, 220 cc fluid removed, erythema, left breast drain removed by MD Dr. Barrie Dunker   ER/PR positive,

## 2012-02-09 ENCOUNTER — Ambulatory Visit
Admit: 2012-02-09 | Discharge: 2012-02-09 | Disposition: A | Discharge status: Home / Self Care | Payer: Medicare Other | Attending: Radiation Oncology | Admitting: Radiation Oncology

## 2012-02-09 ENCOUNTER — Encounter: Payer: Self-pay | Admitting: Radiation Oncology

## 2012-02-09 ENCOUNTER — Ambulatory Visit
Admission: RE | Admit: 2012-02-09 | Discharge: 2012-02-09 | Disposition: A | Discharge status: Home / Self Care | Payer: Medicare Other | Source: Ambulatory Visit | Attending: Radiation Oncology | Admitting: Radiation Oncology

## 2012-02-09 ENCOUNTER — Encounter (INDEPENDENT_AMBULATORY_CARE_PROVIDER_SITE_OTHER): Payer: Self-pay | Admitting: Surgery

## 2012-02-09 ENCOUNTER — Ambulatory Visit (INDEPENDENT_AMBULATORY_CARE_PROVIDER_SITE_OTHER): Payer: Medicare Other | Admitting: Surgery

## 2012-02-09 ENCOUNTER — Encounter: Payer: Self-pay | Admitting: *Deleted

## 2012-02-09 VITALS — BP 151/68 | HR 89 | Temp 98.1°F | Resp 20 | Wt 274.5 lb

## 2012-02-09 VITALS — BP 138/82 | HR 85 | Temp 96.6°F | Resp 18 | Ht 68.0 in | Wt 270.0 lb

## 2012-02-09 DIAGNOSIS — Z9010 Acquired absence of unspecified breast and nipple: Secondary | ICD-10-CM | POA: Insufficient documentation

## 2012-02-09 DIAGNOSIS — Y842 Radiological procedure and radiotherapy as the cause of abnormal reaction of the patient, or of later complication, without mention of misadventure at the time of the procedure: Secondary | ICD-10-CM | POA: Insufficient documentation

## 2012-02-09 DIAGNOSIS — C50919 Malignant neoplasm of unspecified site of unspecified female breast: Secondary | ICD-10-CM

## 2012-02-09 DIAGNOSIS — C50119 Malignant neoplasm of central portion of unspecified female breast: Secondary | ICD-10-CM

## 2012-02-09 DIAGNOSIS — Z79899 Other long term (current) drug therapy: Secondary | ICD-10-CM | POA: Insufficient documentation

## 2012-02-09 DIAGNOSIS — L988 Other specified disorders of the skin and subcutaneous tissue: Secondary | ICD-10-CM | POA: Insufficient documentation

## 2012-02-09 DIAGNOSIS — L589 Radiodermatitis, unspecified: Secondary | ICD-10-CM | POA: Insufficient documentation

## 2012-02-09 DIAGNOSIS — Z09 Encounter for follow-up examination after completed treatment for conditions other than malignant neoplasm: Secondary | ICD-10-CM

## 2012-02-09 DIAGNOSIS — Z170 Estrogen receptor positive status [ER+]: Secondary | ICD-10-CM | POA: Insufficient documentation

## 2012-02-09 DIAGNOSIS — Z510 Encounter for antineoplastic radiation therapy: Secondary | ICD-10-CM | POA: Insufficient documentation

## 2012-02-09 NOTE — Progress Notes (Signed)
New consult ,Breast Cancer both Breasts Bilateral mastectomies  01/21/12,Right Breast simple mastectomy, & right axillary sentinel lymph node bx Left Modified Radical mastectomy, 8/26/13still drainage right matectomy site, 80cc seropurulent fluid drawn off, erythema still present, still seroma present, left site looks good,   Keflex continued for another week ,seen by DR.Blackman  Right breast erythema, dry,, left breast area mild erythema, no c/o pain at all states patient Single,  2 children Allergies: patient states NO

## 2012-02-09 NOTE — Progress Notes (Signed)
Radiation Oncology         [phone] ________________________________  Name: Linda [name] MRN: [id number]  Date: 02/09/2012  DOB: 1946/04/01  Follow-Up Visit Note  CC: Sherron Monday, MD  Magrinat, Valentino Hue, MD Abigail Miyamoto, MD  Diagnosis:   Bilateral breast  Interval Since Last Radiation:  Not applicable   Narrative:  The patient returns today for routine follow-up.  The patient was initially seen preoperatively. She initially presented with bilateral breast cancer which corresponded to T2 N0 on the right and T3 N1 on the left. She has proceeded with neoadjuvant chemotherapy as well as a bilateral mastectomy. This consisted of a simple mastectomy and sentinel lymph node sampling on the right and the modified radical mastectomy on the left. 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 No angiolymphatic invasion was seen. This corresponded to a ER positive, PR positive and HER-2/neu negative tumor. The pathologic stage was YP T1 BYPN0. The margins were negative.  On the left this corresponded to multiple microscopic foci of residual invasive ductal carcinoma with the largest focus being 0.3 cm. Again there is posttreatment effect. All of the margins were negative. A total of 15 lymph nodes were all negative. This corresponds to an ER positive, PR positive, and HER-2/neu positive tumor. Pathologically this corresponded to a YMP T4 D., YP N0 tumor.\  We had initially discussed an anticipated course of postmastectomy radiotherapy on the left. We did not anticipate needing this on the right if she did proceed with a mastectomy.  Patient indicates that she has been doing well postoperatively she feels. She did see Dr. Magnus Ivan today who drained some fluid off on the right chest wall. She also has had some redness and is on Keflex.                               ALLERGIES:  is allergic to ace  inhibitors.  Meds: Current Outpatient Prescriptions  Medication Sig Dispense Refill  . Alum & Mag Hydroxide-Simeth (MAGIC MOUTHWASH W/LIDOCAINE) SOLN Take 5 mLs by mouth 4 (four) times daily as needed.  300 mL  2  . b complex vitamins tablet Take 1 tablet by mouth daily.      . carvedilol (COREG) 3.125 MG tablet Take 1 tablet (3.125 mg total) by mouth 2 (two) times daily with a meal.  60 tablet  3  . cephALEXin (KEFLEX) 500 MG capsule Take 500 mg by mouth 3 (three) times daily. Extended another week dr. Magnus Ivan 02/09/12      . KLOR-CON M20 20 MEQ tablet TAKE 1 TABLET BY MOUTH ONCE DAILY AS NEEDED  30 tablet  1  . losartan-hydrochlorothiazide (HYZAAR) 50-12.5 MG per tablet Take 1 tablet by mouth daily.  30 tablet  3  . Multiple Vitamin (MULTIVITAMIN) capsule Take 1 capsule by mouth daily. States only daily-will stop today.      . Multiple Vitamins-Iron (MULTIVITAMINS WITH IRON) TABS Take 1 tablet by mouth daily.      [name] HYDROcodone-acetaminophen (NORCO/VICODIN) 5-325 MG per tablet       . HYDROcodone-acetaminophen (NORCO/VICODIN) 5-325 MG per tablet Take 1 tablet by mouth every 6 (six) hours as needed.      [name] LORazepam (ATIVAN) 0.5 MG tablet Take 0.5 mg by mouth every 8 (eight) hours.      [name] omeprazole (PRILOSEC) 40 MG capsule Take 1 capsule (40 mg total)  by mouth daily.  30 capsule  12  . prochlorperazine (COMPAZINE) 10 MG tablet Take 1 tablet (10 mg total) by mouth every 6 (six) hours as needed.  30 tablet  2    Physical Findings: The patient is in no acute distress. Patient is alert and oriented.  weight is 274 lb 8 oz (124.512 kg). Her oral temperature is 98.1 F (36.7 C). Her blood pressure is 151/68 and her pulse is 89. Her respiration is 20. [name]   Patient has some bandages applied to both the left in the right chest wall. On the left the mastectomy scar looks good and is healing well. No surrounding erythema. The drain sites show a small degree of drainage present.  On the right the  mastectomy scar looks as if it is healing fairly well also. There is some increased volume on this side and as noted above she did have some fluid drained off of this. There is some surrounding edema consistent with her current antibiotic treatment.  Lab Findings: Lab Results  Component Value Date   WBC 7.2 02/03/2012   HGB 10.8* 02/03/2012   HCT 32.7* 02/03/2012   MCV 93.2 02/03/2012   PLT 252 02/03/2012     Radiographic Findings: Nm Sentinel Node Inj-no Rpt (breast)  01/21/2012  CLINICAL DATA: right breast cancer   Sulfur colloid was injected intradermally by the nuclear medicine  technologist for breast cancer sentinel node localization.      Impression:    66 year old female status post bilateral mastectomy for bilateral breast cancer. She does require postmastectomy radiotherapy on the left. No need for postmastectomy radiotherapy on the right.  Plan:  As was our initial plan for the patient and I discussed once again the role of radiotherapy in this setting. Anticipate 6-1/2 week course of treatment on the left targeting the left chest wall and regional lymph nodes.  The patient is several weeks out from her surgery and she is continuing to heal. I would aim for having her simulation completed middle or the and of next week.  I discussed all of this with the patient. All of her questions were answered during we did discuss in detail the possible side effects and risks of treatment. She does wish to proceed with this treatment and therefore she will be scheduled for a simulation.  I spent 25 minutes with the patient today, the majority of which was spent counseling the patient on the diagnosis of cancer and coordinating care.   Radene Gunning, M.D., Ph.D.

## 2012-02-09 NOTE — Progress Notes (Signed)
Please see the Nurse Progress Note in the MD Initial Consult Encounter for this patient. 

## 2012-02-09 NOTE — Progress Notes (Signed)
Subjective:     Patient ID: Linda [name], female   DOB: 1945-06-28, 66 y.o.   MRN: [id number]  HPI  She is here for another postop visit. She is still having drainage from the right mastectomy site. She remains on antibiotics. She denies fevers. Review of Systems     Objective:   Physical Exam There still mild to moderate erythema of the right mastectomy site. The left site looks good. There still seroma present. I drained partially 80 cc of seropurulent fluid    Assessment:     Postop wound infection status post bilateral mastectomies    Plan:     I will continue the Keflex and see her back as Friday for reevaluation

## 2012-02-13 ENCOUNTER — Encounter (INDEPENDENT_AMBULATORY_CARE_PROVIDER_SITE_OTHER): Payer: Self-pay | Admitting: Surgery

## 2012-02-13 ENCOUNTER — Ambulatory Visit (INDEPENDENT_AMBULATORY_CARE_PROVIDER_SITE_OTHER): Payer: Medicare Other | Admitting: Surgery

## 2012-02-13 VITALS — BP 158/88 | HR 80 | Temp 98.2°F | Resp 18 | Ht 68.0 in | Wt 270.0 lb

## 2012-02-13 DIAGNOSIS — Z09 Encounter for follow-up examination after completed treatment for conditions other than malignant neoplasm: Secondary | ICD-10-CM

## 2012-02-13 NOTE — Progress Notes (Signed)
Subjective:     Patient ID: Linda [name], female   DOB: April 18, 1946, 66 y.o.   MRN: [id number]  HPI She is here for another postop visit. She continues on antibiotics. She denies fevers  Review of Systems     Objective:   Physical Exam On exam, the erythema of the right mastectomy site has completely resolved. I inserted an 18-gauge needle and drained 120 cc of seroma fluid. It was no longer purulent    Assessment:     This is stable postop, resolving right mastectomy seroma    Plan:     She will continue her course of antibiotics and I will see her back next week

## 2012-02-13 NOTE — Addendum Note (Signed)
Encounter addended by: Zyionna Pesce Mintz Airyanna Dipalma, RN on: 02/13/2012  5:25 PM<BR>     Documentation filed: Charges VN

## 2012-02-17 ENCOUNTER — Ambulatory Visit (HOSPITAL_BASED_OUTPATIENT_CLINIC_OR_DEPARTMENT_OTHER): Payer: Medicare Other | Admitting: Oncology

## 2012-02-17 ENCOUNTER — Telehealth: Payer: Self-pay | Admitting: Oncology

## 2012-02-17 VITALS — BP 136/84 | HR 97 | Temp 98.3°F | Resp 20 | Ht 68.0 in | Wt 263.6 lb

## 2012-02-17 DIAGNOSIS — C50519 Malignant neoplasm of lower-outer quadrant of unspecified female breast: Secondary | ICD-10-CM

## 2012-02-17 DIAGNOSIS — Z170 Estrogen receptor positive status [ER+]: Secondary | ICD-10-CM

## 2012-02-17 DIAGNOSIS — C50919 Malignant neoplasm of unspecified site of unspecified female breast: Secondary | ICD-10-CM

## 2012-02-17 DIAGNOSIS — C50119 Malignant neoplasm of central portion of unspecified female breast: Secondary | ICD-10-CM

## 2012-02-17 DIAGNOSIS — C773 Secondary and unspecified malignant neoplasm of axilla and upper limb lymph nodes: Secondary | ICD-10-CM

## 2012-02-17 NOTE — Telephone Encounter (Signed)
gve the pt her sept,oct 20`3 appt calendar

## 2012-02-17 NOTE — Progress Notes (Signed)
ID: Linda [name]   DOB: 25-Feb-1946  MR#: [id number]  WGN#:[id number]  HISTORY OF PRESENT ILLNESS: The patient noted a small amount of drainage from her left breast December of 2012. She brought it to her gynecologist's attention in January of 2013 and was set up for bilateral diagnostic mammography at the [facility] Center July 24, 2011. This was the patient's first ever mammogram. It showed a large irregular mass in the left retroareolar region extending to the nipple, with nipple retraction and skin thickening. This measured approximately 8.4 cm. It was associated with pleomorphic microcalcifications. By exam there was moderate distortion and retraction of the nipple with a large palpable ill-defined area in the retroareolar region. In the right right breast there was also a 2 cm hard mass palpated. Ultrasound of the right breast mass showed a complex cystic/solid area measuring 1.9 cm. Ultrasound of the right axilla was negative. Ultrasound of the left breast showed a large hypoechoic mass measuring at least 3.8 cm. The left axilla showed several adjacent abnormal appearing lymph nodes.  With this information biopsy of the right and left breast masses were obtained the same day, and showed ([id number])  (a) on the right, and invasive ductal carcinoma with papillary features, estrogen and progesterone receptor positive, HER-2 negative, with an MIB-1 of 10%.  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 The MIB-1 was 60% for the left-sided tumor.  With this information the patient was presented at the multidisciplinary breast cancer conference 08/06/2011. Subsequent evaluation and treatments are as detailed below.  INTERVAL HISTORY: Linda [name] returns today for followup of her breast cancer. She had her bilateral mastectomies 01/21/2012. She did well with the surgery, with no significant problems with pain or  fever. Her right sided drain did "fall out", and she has had a series of needle drainage is, which she hopes will and tomorrow. The left-sided drain has been removed.  REVIEW OF SYSTEMS: Aside from those of minor postoperative issues, she is doing well. Occasionally she has palpitations, usually precipitated by activity such as going upstairs. They're not accompanied by chest pain or pressure or shortness of breath. She is resuming her normal exercise pattern. Overall a detailed review of systems today was noncontributory.  PAST MEDICAL HISTORY: Past Medical History  Diagnosis Date  . Cancer 08-13-11    07-31-11-Dx. Bilarteral Breast cancer-left greater than rt.  . Hematuria, undiagnosed cause 08-13-11    Being evaluated by [facility] 08-14-11  . Hypertension   . Bronchitis     hx  . GERD (gastroesophageal reflux disease)     doing well  . Hematuria - cause not known   . 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 Seroma 02/04/12    right breast  200cc removed  erythema on right side  . Seasonal allergies     PAST SURGICAL HISTORY: Past Surgical History  Procedure Date  . Child birth 08-13-11    x3 -NVD  . Portacath placement 08/15/2011    Procedure: INSERTION PORT-A-CATH;  Surgeon: Shelly Rubenstein, MD;  Location: [facility];  Service: General;  Laterality: N/A;  . Mastectomy w/ sentinel node biopsy 01/21/2012    Procedure: MASTECTOMY WITH SENTINEL LYMPH NODE BIOPSY;  Surgeon: Shelly Rubenstein, MD;  Location: [facility];  Service: General;  Laterality: Bilateral;  Left modified radical mastectomy, Rt mastectomy with Sentinel lymphnode biospy  . Breast surgery     FAMILY HISTORY Family History  Problem Relation Age of Onset  . Heart disease Mother   . Cancer Mother 76    breast, , 76 deceased  . Heart attack Father   . Cancer Sister 57    breast  The  patient's father died from a heart attack at the age of 33. The patient's mother died from apparently heart problems at the age of 29. The patient had no brothers. She has 3 sisters. One of her sisters was diagnosed with breast cancer in her mid 33s. The patient does not know if his sister was ever genetically tested. The patient's mother had a mastectomy at the age of 24, presumably for breast cancer. There is no other history of breast or in cancer in the family to her knowledge.   GYNECOLOGIC HISTORY:  She does not recall when she had menarche. She had her first child at age 22. She is GX P3. She underwent menopause in her mid 2s. She never took hormone replacement.   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 She moved to [place] about 2 years ago but has a Museum/gallery curator in [place]. Son Linda [name] lives in [place] and works as a IT sales professional. His wife is a Engineer, civil (consulting). Son Linda [name] lives at [employer] and is a Art therapist in addition to having the [employer]. The patient attends a local [employer] here   ADVANCED DIRECTIVES: Not in place  HEALTH MAINTENANCE: History  Substance Use Topics  . Smoking status: Never Smoker   . Smokeless tobacco: Never Used  . Alcohol Use: Yes     rare- occ.     Colonoscopy: Never  PAP: Feb 2013  Bone density: Never  Lipid panel:  Allergies  Allergen Reactions  . Ace Inhibitors Cough    Current Outpatient Prescriptions  Medication Sig Dispense Refill  . Alum & Mag Hydroxide-Simeth (MAGIC MOUTHWASH W/LIDOCAINE) SOLN Take 5 mLs by mouth 4 (four) times daily as needed.  300 mL  2  . b complex vitamins tablet Take 1 tablet by mouth daily.      . carvedilol (COREG) 3.125 MG tablet Take 1 tablet (3.125 mg total) by mouth 2 (two) times daily with a meal.  60 tablet  3  .  cephALEXin (KEFLEX) 500 MG capsule Take 500 mg by mouth 3 (three) times daily. Extended another week dr. Magnus Ivan 02/09/12      . HYDROcodone-acetaminophen (NORCO/VICODIN) 5-325 MG per tablet       . HYDROcodone-acetaminophen (NORCO/VICODIN) 5-325 MG per tablet Take 1 tablet by mouth every 6 (six) hours as needed.      [name] KLOR-CON M20 20 MEQ tablet TAKE 1 TABLET BY MOUTH ONCE DAILY AS NEEDED  30 tablet  1  . LORazepam (ATIVAN) 0.5 MG tablet Take 0.5 mg by mouth every 8 (eight) hours.      [name] losartan-hydrochlorothiazide (HYZAAR) 50-12.5 MG per tablet Take 1 tablet by mouth daily.  30 tablet  3  . Multiple Vitamin (MULTIVITAMIN) capsule Take 1 capsule by mouth daily. States only daily-will stop today.      . Multiple Vitamins-Iron (MULTIVITAMINS WITH IRON) TABS Take 1 tablet by mouth daily.      [name]  omeprazole (PRILOSEC) 40 MG capsule Take 1 capsule (40 mg total) by mouth daily.  30 capsule  12  . prochlorperazine (COMPAZINE) 10 MG tablet Take 1 tablet (10 mg total) by mouth every 6 (six) hours as needed.  30 tablet  2    OBJECTIVE: [name] in no acute distress Filed Vitals:   02/17/12 1356  BP: 136/84  Pulse: 97  Temp: 98.3 F (36.8 C)  Resp: 20     Body mass index is 40.08 kg/(m^2).    ECOG FS: 1  Filed Weights   02/17/12 1356  Weight: 263 lb 9.6 oz (119.568 kg)   Sclerae unicteric Oropharynx clear No cervical or supraclavicular adenopathy Lungs no rales or rhonchi Heart regular rate and rhythm Abd benign MSK no focal spinal tenderness, 1+ bilateral ankle edema Neuro: nonfocal Breasts: Status post bilateral mastectomies. There is no dehiscence, erythema, or significant fluid accumulation by examination. Both axillae are clear.  LAB RESULTS: Lab Results  Component Value Date   WBC 7.2 02/03/2012   NEUTROABS 5.3 02/03/2012   HGB 10.8* 02/03/2012   HCT 32.7* 02/03/2012   MCV 93.2 02/03/2012   PLT 252 02/03/2012      Chemistry      Component Value Date/Time   NA  140 02/03/2012 1125   K 3.5 02/03/2012 1125   CL 102 02/03/2012 1125   CO2 28 02/03/2012 1125   BUN 12 02/03/2012 1125   CREATININE 0.55 02/03/2012 1125      Component Value Date/Time   CALCIUM 8.9 02/03/2012 1125   ALKPHOS 99 02/03/2012 1125   AST 35 02/03/2012 1125   ALT 65* 02/03/2012 1125   BILITOT 0.2* 02/03/2012 1125       Lab Results  Component Value Date   LABCA2 87* 08/06/2011    STUDIES:  Echocardiogram, 12/04/11, EF = 55-60%  ASSESSMENT: 66 y.o.  [place] woman   (1)  status post bilateral breast biopsies 07/24/2011, showing,      (a) on the right, a clinical T2 N0 papillary/ductal breast cancer, estrogen and progesterone receptor positive, HER-2 negative, with an MIB-1 of 10%;     (b) on the left, a T3 N1 invasive ductal carcinoma, grade 3, triple positive, with an MIB-1 of 60%.  (2)  Status post 4 dose dense cycles of doxorubicin/ cyclophosphamide followed by 4 dose dense cycles of paclitaxel and trastuzumab completed 12/09/2011  (3) the trastuzumab to be continued for a total of one year (to mid-May 2014); most recent echo 12/04/2011  (4) s/p bilateral mastectomies 01/21/2012 showing   (a) on the Right, an 8 mm invasive papillary carcinoma, grade 1, ypT1b ypN0   (b) on the Left, multiple microscopic foci of residual  Invasive ductal carcinoma with evidence of dermal lymphatic involvement, pyT1a/T4 pyN0  (5)  the patient will need postmastectomy radiation on the left.  PLAN: She did well with her surgery, and the next stop is radiation to the left chest wall. She has an appointment with Dr. Mitzi Hansen to set that up tomorrow. We are continuing the Herceptin, with the next dose due September 10. Once she finishes radiation we will likely start aromatase inhibitors and if she tolerates them well we will set her up for a bone density to assess the need for bisphosphonates. Repeat mammography will be due February of 2014. She knows to call for any problems that may develop  before the next visit.  Kendrick Remigio C    02/17/2012

## 2012-02-18 ENCOUNTER — Encounter (INDEPENDENT_AMBULATORY_CARE_PROVIDER_SITE_OTHER): Payer: Self-pay | Admitting: Surgery

## 2012-02-18 ENCOUNTER — Other Ambulatory Visit: Payer: Self-pay | Admitting: Radiation Oncology

## 2012-02-18 ENCOUNTER — Ambulatory Visit (INDEPENDENT_AMBULATORY_CARE_PROVIDER_SITE_OTHER): Payer: Medicare Other | Admitting: Surgery

## 2012-02-18 VITALS — BP 136/84 | HR 90 | Temp 97.4°F | Ht 68.0 in | Wt 267.0 lb

## 2012-02-18 DIAGNOSIS — Z09 Encounter for follow-up examination after completed treatment for conditions other than malignant neoplasm: Secondary | ICD-10-CM

## 2012-02-18 NOTE — Progress Notes (Signed)
Subjective:     Patient ID: Linda [name], female   DOB: 1945-06-25, 66 y.o.   MRN: [id number]  HPI She is here for another followup. She has no complaints.  Review of Systems     Objective:   Physical Exam    there is no erythema on the right mastectomy site. I did drain 60 cc of clear seroma fluid Assessment:     Patient stable postop    Plan:     I will see her back on September 23. She will be starting radiation next week

## 2012-02-19 ENCOUNTER — Ambulatory Visit
Admission: RE | Admit: 2012-02-19 | Discharge: 2012-02-19 | Disposition: A | Discharge status: Home / Self Care | Payer: Medicare Other | Source: Ambulatory Visit | Attending: Radiation Oncology | Admitting: Radiation Oncology

## 2012-02-19 DIAGNOSIS — C50119 Malignant neoplasm of central portion of unspecified female breast: Secondary | ICD-10-CM

## 2012-02-21 NOTE — Progress Notes (Signed)
  Radiation Oncology         [phone] ________________________________  Name: Linda [name] MRN: [id number]  Date: 02/19/2012  DOB: July 19, 1945   SIMULATION AND TREATMENT PLANNING NOTE  The patient presented for simulation prior to beginning her course of radiation treatment for her diagnosis of left-sided breast cancer. The patient was placed in a supine position on a breast board. A customized accuform device was also constructed and this complex treatment device will be used on a daily basis during her treatment. In this fashion, a CT scan was obtained through the chest area and an isocenter was placed near the chest wall at the upper aspect of the right chest. A breath hold technique was evaluated to help spare the heart - this technique was effective and will be used for treatment.  The patient will be planned to receive a course of radiation initially to a dose of 50.4 gray. This will consist of a 4 field technique targeting the chest wall as well as the supraclavicular region. Therefore 2 customized medial and lateral tangent fields have been created targeting the chest wall, and also 2 additional customized fields have been designed to treat the supraclavicular region both with a left supraclavicular field and a left posterior axillary boost field. This treatment will be accomplished at 1.8 gray per fraction. A 3D conformal treatment plan will be used: the [facility] [place] for the CTV, lungs and heart will be evaluated during review. A forward planning technique will also be evaluated to determine if this approach improves the plan. It is anticipated that the patient will then receive a 10 gray boost to the scar plus margin. This will be accomplished at 2 gray per fraction. The final anticipated total dose therefore will correspond to 60.4 gray.    _______________________________   Radene Gunning, MD, PhD

## 2012-02-24 ENCOUNTER — Other Ambulatory Visit (HOSPITAL_BASED_OUTPATIENT_CLINIC_OR_DEPARTMENT_OTHER): Payer: Medicare Other | Admitting: Lab

## 2012-02-24 ENCOUNTER — Ambulatory Visit (HOSPITAL_BASED_OUTPATIENT_CLINIC_OR_DEPARTMENT_OTHER): Payer: Medicare Other

## 2012-02-24 VITALS — BP 128/66 | HR 78 | Temp 98.2°F | Resp 20

## 2012-02-24 DIAGNOSIS — C773 Secondary and unspecified malignant neoplasm of axilla and upper limb lymph nodes: Secondary | ICD-10-CM

## 2012-02-24 DIAGNOSIS — Z5112 Encounter for antineoplastic immunotherapy: Secondary | ICD-10-CM

## 2012-02-24 DIAGNOSIS — C50919 Malignant neoplasm of unspecified site of unspecified female breast: Secondary | ICD-10-CM

## 2012-02-24 DIAGNOSIS — C50119 Malignant neoplasm of central portion of unspecified female breast: Secondary | ICD-10-CM

## 2012-02-24 DIAGNOSIS — C50519 Malignant neoplasm of lower-outer quadrant of unspecified female breast: Secondary | ICD-10-CM

## 2012-02-24 DIAGNOSIS — C801 Malignant (primary) neoplasm, unspecified: Secondary | ICD-10-CM

## 2012-02-24 LAB — CBC WITH DIFFERENTIAL/PLATELET
BASO%: 0.4 % (ref 0.0–2.0)
EOS%: 2.2 % (ref 0.0–7.0)
HCT: 34.3 % — ABNORMAL LOW (ref 34.8–46.6)
LYMPH%: 34.5 % (ref 14.0–49.7)
MCH: 30.1 pg (ref 25.1–34.0)
MCHC: 32.9 g/dL (ref 31.5–36.0)
MCV: 91.5 fL (ref 79.5–101.0)
MONO%: 10.5 % (ref 0.0–14.0)
NEUT%: 52.4 % (ref 38.4–76.8)
Platelets: 220 10*3/uL (ref 145–400)
lymph#: 1.5 10*3/uL (ref 0.9–3.3)

## 2012-02-24 LAB — COMPREHENSIVE METABOLIC PANEL (CC13)
Albumin: 3.3 g/dL — ABNORMAL LOW (ref 3.5–5.0)
CO2: 25 mEq/L (ref 22–29)
Calcium: 9.5 mg/dL (ref 8.4–10.4)
Chloride: 104 mEq/L (ref 98–107)
Glucose: 124 mg/dl — ABNORMAL HIGH (ref 70–99)
Sodium: 141 mEq/L (ref 136–145)
Total Bilirubin: 0.4 mg/dL (ref 0.20–1.20)
Total Protein: 6.7 g/dL (ref 6.4–8.3)

## 2012-02-24 MED ORDER — ACETAMINOPHEN 325 MG PO TABS
650.0000 mg | ORAL_TABLET | Freq: Once | ORAL | Status: AC
  Administered 2012-02-24: 650 mg via ORAL

## 2012-02-24 MED ORDER — HEPARIN SOD (PORK) LOCK FLUSH 100 UNIT/ML IV SOLN
500.0000 [IU] | Freq: Once | INTRAVENOUS | Status: AC | PRN
  Administered 2012-02-24: 500 [IU]
  Filled 2012-02-24: qty 5

## 2012-02-24 MED ORDER — SODIUM CHLORIDE 0.9 % IV SOLN
Freq: Once | INTRAVENOUS | Status: AC
  Administered 2012-02-24: 12:00:00 via INTRAVENOUS

## 2012-02-24 MED ORDER — DIPHENHYDRAMINE HCL 25 MG PO CAPS
25.0000 mg | ORAL_CAPSULE | Freq: Once | ORAL | Status: AC
  Administered 2012-02-24: 25 mg via ORAL

## 2012-02-24 MED ORDER — SODIUM CHLORIDE 0.9 % IJ SOLN
10.0000 mL | INTRAMUSCULAR | Status: DC | PRN
  Administered 2012-02-24: 10 mL
  Filled 2012-02-24: qty 10

## 2012-02-24 MED ORDER — TRASTUZUMAB CHEMO INJECTION 440 MG
6.0000 mg/kg | Freq: Once | INTRAVENOUS | Status: AC
  Administered 2012-02-24: 735 mg via INTRAVENOUS
  Filled 2012-02-24: qty 35

## 2012-02-24 NOTE — Patient Instructions (Signed)
[facility] Discharge Instructions for Patients Receiving Chemotherapy  Today you received the following chemotherapy agents Herceptin To help prevent nausea and vomiting after your treatment, we encourage you to take your nausea medication as prescribed.  If you develop nausea and vomiting that is not controlled by your nausea medication, call the clinic. If it is after clinic hours your family physician or the after hours number for the clinic or go to the Emergency Department.   BELOW ARE SYMPTOMS THAT SHOULD BE REPORTED IMMEDIATELY:  *FEVER GREATER THAN 100.5 F  *CHILLS WITH OR WITHOUT FEVER  NAUSEA AND VOMITING THAT IS NOT CONTROLLED WITH YOUR NAUSEA MEDICATION  *UNUSUAL SHORTNESS OF BREATH  *UNUSUAL BRUISING OR BLEEDING  TENDERNESS IN MOUTH AND THROAT WITH OR WITHOUT PRESENCE OF ULCERS  *URINARY PROBLEMS  *BOWEL PROBLEMS  UNUSUAL RASH Items with * indicate a potential emergency and should be followed up as soon as possible.  One of the nurses will contact you 24 hours after your treatment. Please let the nurse know about any problems that you may have experienced. Feel free to call the clinic you have any questions or concerns. The clinic phone number is [phone].   I have been informed and understand all the instructions given to me. I know to contact the clinic, my physician, or go to the Emergency Department if any problems should occur. I do not have any questions at this time, but understand that I may call the clinic during office hours   should I have any questions or need assistance in obtaining follow up care.    __________________________________________  _____________  __________ Signature of Patient or Authorized Representative            Date                   Time    __________________________________________ Nurse's Signature    

## 2012-02-26 ENCOUNTER — Ambulatory Visit
Admission: RE | Admit: 2012-02-26 | Discharge: 2012-02-26 | Disposition: A | Discharge status: Home / Self Care | Payer: Medicare Other | Source: Ambulatory Visit | Attending: Radiation Oncology | Admitting: Radiation Oncology

## 2012-02-26 DIAGNOSIS — C50119 Malignant neoplasm of central portion of unspecified female breast: Secondary | ICD-10-CM

## 2012-02-29 NOTE — Progress Notes (Signed)
  Radiation Oncology         [phone] ________________________________  Name: Linda [name] MRN: [id number]  Date: 02/26/2012  DOB: 02-10-1946  Simulation Verification Note   NARRATIVE: The patient was brought to the treatment unit and placed in the planned treatment position. The clinical setup was verified. Then port films were obtained and uploaded to the radiation oncology medical record software.  The treatment beams were carefully compared against the planned radiation fields. The position, location, and shape of the radiation fields was reviewed. The targeted volume of tissue appears to be appropriately covered by the radiation beams. Based on my personal review, I approved the simulation verification. The patient's treatment will proceed as planned.  ________________________________   Radene Gunning, MD, PhD

## 2012-03-01 ENCOUNTER — Other Ambulatory Visit (HOSPITAL_COMMUNITY): Payer: Self-pay | Admitting: Adult Health

## 2012-03-01 ENCOUNTER — Ambulatory Visit
Admission: RE | Admit: 2012-03-01 | Discharge: 2012-03-01 | Disposition: A | Discharge status: Home / Self Care | Payer: Medicare Other | Source: Ambulatory Visit | Attending: Radiation Oncology | Admitting: Radiation Oncology

## 2012-03-02 ENCOUNTER — Ambulatory Visit: Payer: Medicare Other

## 2012-03-03 ENCOUNTER — Ambulatory Visit
Admission: RE | Admit: 2012-03-03 | Discharge: 2012-03-03 | Disposition: A | Discharge status: Home / Self Care | Payer: Medicare Other | Source: Ambulatory Visit | Attending: Radiation Oncology | Admitting: Radiation Oncology

## 2012-03-03 ENCOUNTER — Ambulatory Visit
Admission: RE | Admit: 2012-03-03 | Payer: Medicare Other | Source: Ambulatory Visit | Attending: Radiation Oncology | Admitting: Radiation Oncology

## 2012-03-03 VITALS — Wt 263.0 lb

## 2012-03-03 DIAGNOSIS — C50119 Malignant neoplasm of central portion of unspecified female breast: Secondary | ICD-10-CM

## 2012-03-03 MED ORDER — ALRA NON-METALLIC DEODORANT (RAD-ONC)
1.0000 "application " | Freq: Once | TOPICAL | Status: AC
  Administered 2012-03-03: 1 via TOPICAL

## 2012-03-03 MED ORDER — RADIAPLEXRX EX GEL
Freq: Once | CUTANEOUS | Status: AC
  Administered 2012-03-03: 15:00:00 via TOPICAL

## 2012-03-04 ENCOUNTER — Ambulatory Visit: Admission: RE | Admit: 2012-03-04 | Payer: Medicare Other | Source: Ambulatory Visit | Admitting: Radiation Oncology

## 2012-03-04 ENCOUNTER — Ambulatory Visit
Admission: RE | Admit: 2012-03-04 | Discharge: 2012-03-04 | Disposition: A | Discharge status: Home / Self Care | Payer: Medicare Other | Source: Ambulatory Visit | Attending: Radiation Oncology | Admitting: Radiation Oncology

## 2012-03-05 ENCOUNTER — Ambulatory Visit
Admission: RE | Admit: 2012-03-05 | Discharge: 2012-03-05 | Disposition: A | Discharge status: Home / Self Care | Payer: Medicare Other | Source: Ambulatory Visit | Attending: Radiation Oncology | Admitting: Radiation Oncology

## 2012-03-05 VITALS — BP 145/65 | HR 78 | Temp 98.1°F | Wt 262.0 lb

## 2012-03-05 DIAGNOSIS — C50119 Malignant neoplasm of central portion of unspecified female breast: Secondary | ICD-10-CM

## 2012-03-05 NOTE — Progress Notes (Signed)
Weekly under treat assessment of left chest wall radiation.Treatment initiated this week .Patient education provided.No questions or concerns today.

## 2012-03-05 NOTE — Progress Notes (Signed)
   Department of Radiation Oncology  Phone:  [phone] Fax:        [phone]  Weekly Treatment Note    Name: Linda [name] Date: 03/05/2012 MRN: [id number] DOB: [birth year]   Current dose: 7.2 Gy  Current fraction: 4   MEDICATIONS: Current Outpatient Prescriptions  Medication Sig Dispense Refill  . Alum & Mag Hydroxide-Simeth (MAGIC MOUTHWASH W/LIDOCAINE) SOLN Take 5 mLs by mouth 4 (four) times daily as needed.  300 mL  2  . b complex vitamins tablet Take 1 tablet by mouth daily.      . carvedilol (COREG) 3.125 MG tablet TAKE 1 TABLET BY MOUTH TWICE DAILY WITH A MEAL  60 tablet  3  . cephALEXin (KEFLEX) 500 MG capsule Take 500 mg by mouth 3 (three) times daily. Extended another week dr. Magnus Ivan 02/09/12      . hyaluronate sodium (RADIAPLEXRX) GEL Apply topically once. Not to apply to skin area 4 hours before rad tx, use after rad tx and bedtime      . HYDROcodone-acetaminophen (NORCO/VICODIN) 5-325 MG per tablet       . HYDROcodone-acetaminophen (NORCO/VICODIN) 5-325 MG per tablet Take 1 tablet by mouth every 6 (six) hours as needed.      [name] KLOR-CON M20 20 MEQ tablet TAKE 1 TABLET BY MOUTH ONCE DAILY AS NEEDED  30 tablet  1  . LORazepam (ATIVAN) 0.5 MG tablet Take 0.5 mg by mouth every 8 (eight) hours.      [name] losartan-hydrochlorothiazide (HYZAAR) 50-12.5 MG per tablet Take 1 tablet by mouth daily.  30 tablet  3  . Multiple Vitamin (MULTIVITAMIN) capsule Take 1 capsule by mouth daily. States only daily-will stop today.      . Multiple Vitamins-Iron (MULTIVITAMINS WITH IRON) TABS Take 1 tablet by mouth daily.      . non-metallic deodorant Thornton Papas) MISC Apply 1 application topically daily as needed. Do not applt 4 hours before rad tx      . omeprazole (PRILOSEC) 40 MG capsule Take 1 capsule (40 mg total) by mouth daily.  30 capsule  12  . prochlorperazine (COMPAZINE) 10 MG tablet Take 1 tablet (10 mg total) by mouth every 6 (six) hours as needed.  30 tablet  2     ALLERGIES:  Ace inhibitors   LABORATORY DATA:  Lab Results  Component Value Date   WBC 4.5 02/24/2012   HGB 11.3* 02/24/2012   HCT 34.3* 02/24/2012   MCV 91.5 02/24/2012   PLT 220 02/24/2012   Lab Results  Component Value Date   NA 141 02/24/2012   K 3.6 02/24/2012   CL 104 02/24/2012   CO2 25 02/24/2012   Lab Results  Component Value Date   ALT 24 02/24/2012   AST 22 02/24/2012   ALKPHOS 86 02/24/2012   BILITOT 0.40 02/24/2012     NARRATIVE: Linda [name] was seen today for weekly treatment management. The chart was checked and the patient's films were reviewed. The patient is doing well. She states that she has had absolutely no problems at all until this point.  PHYSICAL EXAMINATION: weight is 262 lb (118.842 kg). Her temperature is 98.1 F (36.7 C). Her blood pressure is 145/65 and her pulse is 78.        ASSESSMENT: The patient is doing satisfactorily with treatment.  PLAN: We will continue with the patient's radiation treatment as planned.

## 2012-03-08 ENCOUNTER — Encounter (INDEPENDENT_AMBULATORY_CARE_PROVIDER_SITE_OTHER): Payer: Self-pay | Admitting: Surgery

## 2012-03-08 ENCOUNTER — Ambulatory Visit (INDEPENDENT_AMBULATORY_CARE_PROVIDER_SITE_OTHER): Payer: Medicare Other | Admitting: Surgery

## 2012-03-08 ENCOUNTER — Ambulatory Visit
Admission: RE | Admit: 2012-03-08 | Discharge: 2012-03-08 | Disposition: A | Discharge status: Home / Self Care | Payer: Medicare Other | Source: Ambulatory Visit | Attending: Radiation Oncology | Admitting: Radiation Oncology

## 2012-03-08 VITALS — BP 124/86 | HR 91 | Temp 98.0°F | Ht 68.0 in | Wt 264.0 lb

## 2012-03-08 DIAGNOSIS — Z09 Encounter for follow-up examination after completed treatment for conditions other than malignant neoplasm: Secondary | ICD-10-CM

## 2012-03-08 NOTE — Progress Notes (Signed)
Subjective:     Patient ID: Linda [name], female   DOB: 1945/09/30, 66 y.o.   MRN: [id number]  HPI She has been undergoing radiation therapy now for a week. She has no complaints regarding either mastectomy site.  Review of Systems     Objective:   Physical Exam    There is no evidence recurrence of seroma on the right side. The left chest is healing well Assessment:     Patient status post bilateral mastectomies for bilateral breast cancer. Currently undergoing radiation the left side    Plan:     I will see her back in 3 months. She was to go ahead and see a Engineer, petroleum to consider bilateral reconstruction. We will schedule this with Dr. Odis Luster within she is done with her radiation

## 2012-03-09 ENCOUNTER — Telehealth (INDEPENDENT_AMBULATORY_CARE_PROVIDER_SITE_OTHER): Payer: Self-pay

## 2012-03-09 ENCOUNTER — Ambulatory Visit
Admission: RE | Admit: 2012-03-09 | Discharge: 2012-03-09 | Disposition: A | Discharge status: Home / Self Care | Payer: Medicare Other | Source: Ambulatory Visit | Attending: Radiation Oncology | Admitting: Radiation Oncology

## 2012-03-09 NOTE — Telephone Encounter (Signed)
I called and left the pt a message to call me so I can give her the appt for Dr Odis Luster 05/05/12 at 1pm// arrive at 12:45.  He is in our building [address] phone # [phone] and fax # [phone].  I have faxed the op note, path and office notes.

## 2012-03-09 NOTE — Telephone Encounter (Signed)
The pt returned my call and I gave her the info.

## 2012-03-10 ENCOUNTER — Ambulatory Visit
Admission: RE | Admit: 2012-03-10 | Discharge: 2012-03-10 | Disposition: A | Discharge status: Home / Self Care | Payer: Medicare Other | Source: Ambulatory Visit | Attending: Radiation Oncology | Admitting: Radiation Oncology

## 2012-03-11 ENCOUNTER — Ambulatory Visit
Admission: RE | Admit: 2012-03-11 | Discharge: 2012-03-11 | Disposition: A | Discharge status: Home / Self Care | Payer: Medicare Other | Source: Ambulatory Visit | Attending: Radiation Oncology | Admitting: Radiation Oncology

## 2012-03-11 VITALS — BP 140/73 | HR 79 | Temp 97.9°F | Wt 260.9 lb

## 2012-03-11 DIAGNOSIS — C50119 Malignant neoplasm of central portion of unspecified female breast: Secondary | ICD-10-CM

## 2012-03-11 NOTE — Progress Notes (Signed)
Patient here for routine weekly doctor assessment of left chest wall radiation treatment.Completed treatment number 8.Skin pink without irritation.Patient denies pain but does have dry cough and hoarsness.

## 2012-03-11 NOTE — Progress Notes (Signed)
   Department of Radiation Oncology  Phone:  [phone] Fax:        [phone]  Weekly Treatment Note    Name: Linda [name] Date: 03/11/2012 MRN: [id number] DOB: 1945-08-17   Current dose: 14.4 Gy  Current fraction: 8   MEDICATIONS: Current Outpatient Prescriptions  Medication Sig Dispense Refill  . Alum & Mag Hydroxide-Simeth (MAGIC MOUTHWASH W/LIDOCAINE) SOLN Take 5 mLs by mouth 4 (four) times daily as needed.  300 mL  2  . b complex vitamins tablet Take 1 tablet by mouth daily.      . carvedilol (COREG) 3.125 MG tablet TAKE 1 TABLET BY MOUTH TWICE DAILY WITH A MEAL  60 tablet  3  . cephALEXin (KEFLEX) 500 MG capsule Take 500 mg by mouth 3 (three) times daily. Extended another week dr. Magnus Ivan 02/09/12      . hyaluronate sodium (RADIAPLEXRX) GEL Apply topically once. Not to apply to skin area 4 hours before rad tx, use after rad tx and bedtime      . HYDROcodone-acetaminophen (NORCO/VICODIN) 5-325 MG per tablet       . HYDROcodone-acetaminophen (NORCO/VICODIN) 5-325 MG per tablet Take 1 tablet by mouth every 6 (six) hours as needed.      [name] KLOR-CON M20 20 MEQ tablet TAKE 1 TABLET BY MOUTH ONCE DAILY AS NEEDED  30 tablet  1  . LORazepam (ATIVAN) 0.5 MG tablet Take 0.5 mg by mouth every 8 (eight) hours.      [name] losartan-hydrochlorothiazide (HYZAAR) 50-12.5 MG per tablet Take 1 tablet by mouth daily.  30 tablet  3  . Multiple Vitamin (MULTIVITAMIN) capsule Take 1 capsule by mouth daily. States only daily-will stop today.      . Multiple Vitamins-Iron (MULTIVITAMINS WITH IRON) TABS Take 1 tablet by mouth daily.      . non-metallic deodorant Thornton Papas) MISC Apply 1 application topically daily as needed. Do not applt 4 hours before rad tx      . omeprazole (PRILOSEC) 40 MG capsule Take 1 capsule (40 mg total) by mouth daily.  30 capsule  12  . prochlorperazine (COMPAZINE) 10 MG tablet Take 1 tablet (10 mg total) by mouth every 6 (six) hours as needed.  30 tablet  2     ALLERGIES:  Ace inhibitors   LABORATORY DATA:  Lab Results  Component Value Date   WBC 4.5 02/24/2012   HGB 11.3* 02/24/2012   HCT 34.3* 02/24/2012   MCV 91.5 02/24/2012   PLT 220 02/24/2012   Lab Results  Component Value Date   NA 141 02/24/2012   K 3.6 02/24/2012   CL 104 02/24/2012   CO2 25 02/24/2012   Lab Results  Component Value Date   ALT 24 02/24/2012   AST 22 02/24/2012   ALKPHOS 86 02/24/2012   BILITOT 0.40 02/24/2012     NARRATIVE: Linda [name] was seen today for weekly treatment management. The chart was checked and the patient's films were reviewed. The patient is doing well with treatment she states. No significant changes in her skin. She does have some congestion with an upper respiratory infection.  PHYSICAL EXAMINATION: weight is 260 lb 14.4 oz (118.343 kg). Her temperature is 97.9 F (36.6 C). Her blood pressure is 140/73 and her pulse is 79.      faint erythema present  ASSESSMENT: The patient is doing satisfactorily with treatment.  PLAN: We will continue with the patient's radiation treatment as planned.

## 2012-03-12 ENCOUNTER — Ambulatory Visit
Admission: RE | Admit: 2012-03-12 | Discharge: 2012-03-12 | Disposition: A | Discharge status: Home / Self Care | Payer: Medicare Other | Source: Ambulatory Visit | Attending: Radiation Oncology | Admitting: Radiation Oncology

## 2012-03-15 ENCOUNTER — Ambulatory Visit
Admission: RE | Admit: 2012-03-15 | Discharge: 2012-03-15 | Disposition: A | Discharge status: Home / Self Care | Payer: Medicare Other | Source: Ambulatory Visit | Attending: Radiation Oncology | Admitting: Radiation Oncology

## 2012-03-16 ENCOUNTER — Other Ambulatory Visit (HOSPITAL_BASED_OUTPATIENT_CLINIC_OR_DEPARTMENT_OTHER): Payer: Medicare Other | Admitting: Lab

## 2012-03-16 ENCOUNTER — Ambulatory Visit (HOSPITAL_BASED_OUTPATIENT_CLINIC_OR_DEPARTMENT_OTHER): Payer: Medicare Other

## 2012-03-16 ENCOUNTER — Other Ambulatory Visit: Payer: Medicare Other | Admitting: Lab

## 2012-03-16 ENCOUNTER — Ambulatory Visit
Admission: RE | Admit: 2012-03-16 | Discharge: 2012-03-16 | Disposition: A | Discharge status: Home / Self Care | Payer: Medicare Other | Source: Ambulatory Visit | Attending: Radiation Oncology | Admitting: Radiation Oncology

## 2012-03-16 VITALS — BP 143/81 | HR 76 | Temp 98.2°F | Resp 18

## 2012-03-16 DIAGNOSIS — Z5112 Encounter for antineoplastic immunotherapy: Secondary | ICD-10-CM

## 2012-03-16 DIAGNOSIS — C50519 Malignant neoplasm of lower-outer quadrant of unspecified female breast: Secondary | ICD-10-CM

## 2012-03-16 DIAGNOSIS — C773 Secondary and unspecified malignant neoplasm of axilla and upper limb lymph nodes: Secondary | ICD-10-CM

## 2012-03-16 DIAGNOSIS — C801 Malignant (primary) neoplasm, unspecified: Secondary | ICD-10-CM

## 2012-03-16 DIAGNOSIS — C50119 Malignant neoplasm of central portion of unspecified female breast: Secondary | ICD-10-CM

## 2012-03-16 DIAGNOSIS — C50919 Malignant neoplasm of unspecified site of unspecified female breast: Secondary | ICD-10-CM

## 2012-03-16 LAB — CBC WITH DIFFERENTIAL/PLATELET
BASO%: 0.5 % (ref 0.0–2.0)
Eosinophils Absolute: 0.1 10*3/uL (ref 0.0–0.5)
MCHC: 31.9 g/dL (ref 31.5–36.0)
MONO#: 0.5 10*3/uL (ref 0.1–0.9)
NEUT#: 2.4 10*3/uL (ref 1.5–6.5)
Platelets: 202 10*3/uL (ref 145–400)
RBC: 3.86 10*6/uL (ref 3.70–5.45)
RDW: 14.6 % — ABNORMAL HIGH (ref 11.2–14.5)
WBC: 3.9 10*3/uL (ref 3.9–10.3)
lymph#: 0.8 10*3/uL — ABNORMAL LOW (ref 0.9–3.3)
nRBC: 0 % (ref 0–0)

## 2012-03-16 MED ORDER — HEPARIN SOD (PORK) LOCK FLUSH 100 UNIT/ML IV SOLN
500.0000 [IU] | Freq: Once | INTRAVENOUS | Status: AC | PRN
  Administered 2012-03-16: 500 [IU]
  Filled 2012-03-16: qty 5

## 2012-03-16 MED ORDER — SODIUM CHLORIDE 0.9 % IJ SOLN
10.0000 mL | INTRAMUSCULAR | Status: DC | PRN
  Administered 2012-03-16: 10 mL
  Filled 2012-03-16: qty 10

## 2012-03-16 MED ORDER — SODIUM CHLORIDE 0.9 % IV SOLN
Freq: Once | INTRAVENOUS | Status: AC
  Administered 2012-03-16: 12:00:00 via INTRAVENOUS

## 2012-03-16 MED ORDER — ACETAMINOPHEN 325 MG PO TABS
650.0000 mg | ORAL_TABLET | Freq: Once | ORAL | Status: AC
  Administered 2012-03-16: 650 mg via ORAL

## 2012-03-16 MED ORDER — TRASTUZUMAB CHEMO INJECTION 440 MG
6.0000 mg/kg | Freq: Once | INTRAVENOUS | Status: AC
  Administered 2012-03-16: 735 mg via INTRAVENOUS
  Filled 2012-03-16: qty 35

## 2012-03-16 MED ORDER — DIPHENHYDRAMINE HCL 25 MG PO CAPS
25.0000 mg | ORAL_CAPSULE | Freq: Once | ORAL | Status: AC
  Administered 2012-03-16: 25 mg via ORAL

## 2012-03-16 NOTE — Patient Instructions (Addendum)
[facility] Discharge Instructions for Patients Receiving Chemotherapy  Today you received the following chemotherapy agents Herceptin.  To help prevent nausea and vomiting after your treatment, we encourage you to take your nausea medication.   If you develop nausea and vomiting that is not controlled by your nausea medication, call the clinic. If it is after clinic hours your family physician or the after hours number for the clinic or go to the Emergency Department.   BELOW ARE SYMPTOMS THAT SHOULD BE REPORTED IMMEDIATELY:  *FEVER GREATER THAN 100.5 F  *CHILLS WITH OR WITHOUT FEVER  NAUSEA AND VOMITING THAT IS NOT CONTROLLED WITH YOUR NAUSEA MEDICATION  *UNUSUAL SHORTNESS OF BREATH  *UNUSUAL BRUISING OR BLEEDING  TENDERNESS IN MOUTH AND THROAT WITH OR WITHOUT PRESENCE OF ULCERS  *URINARY PROBLEMS  *BOWEL PROBLEMS  UNUSUAL RASH Items with * indicate a potential emergency and should be followed up as soon as possible.  One of the nurses will contact you 24 hours after your treatment. Please let the nurse know about any problems that you may have experienced. Feel free to call the clinic you have any questions or concerns. The clinic phone number is [phone].   I have been informed and understand all the instructions given to me. I know to contact the clinic, my physician, or go to the Emergency Department if any problems should occur. I do not have any questions at this time, but understand that I may call the clinic during office hours   should I have any questions or need assistance in obtaining follow up care.    __________________________________________  _____________  __________ Signature of Patient or Authorized Representative            Date                   Time    __________________________________________ Nurse's Signature    

## 2012-03-17 ENCOUNTER — Ambulatory Visit
Admission: RE | Admit: 2012-03-17 | Discharge: 2012-03-17 | Disposition: A | Discharge status: Home / Self Care | Payer: Medicare Other | Source: Ambulatory Visit | Attending: Radiation Oncology | Admitting: Radiation Oncology

## 2012-03-18 ENCOUNTER — Ambulatory Visit
Admission: RE | Admit: 2012-03-18 | Discharge: 2012-03-18 | Disposition: A | Discharge status: Home / Self Care | Payer: Medicare Other | Source: Ambulatory Visit | Attending: Radiation Oncology | Admitting: Radiation Oncology

## 2012-03-19 ENCOUNTER — Encounter: Payer: Self-pay | Admitting: Radiation Oncology

## 2012-03-19 ENCOUNTER — Ambulatory Visit
Admission: RE | Admit: 2012-03-19 | Discharge: 2012-03-19 | Disposition: A | Discharge status: Home / Self Care | Payer: Medicare Other | Source: Ambulatory Visit | Attending: Radiation Oncology | Admitting: Radiation Oncology

## 2012-03-19 VITALS — BP 123/78 | HR 78 | Temp 98.4°F | Resp 20 | Wt 259.7 lb

## 2012-03-19 DIAGNOSIS — C50119 Malignant neoplasm of central portion of unspecified female breast: Secondary | ICD-10-CM

## 2012-03-19 NOTE — Progress Notes (Signed)
Pt denies pain, fatigue, loss of appetite. Applying Radiaplex lotion to left chest wall tid. She states the redness on her upper left chest is due to multiple mosquito bites.

## 2012-03-19 NOTE — Progress Notes (Signed)
Department of Radiation Oncology  Phone:  [phone] Fax:        [phone]  Weekly Treatment Note    Name: Linda [name] Date: 03/19/2012 MRN: [id number] DOB: 07-01-1945   Current dose: 25.2 Gy  Current fraction: 14   MEDICATIONS: Current Outpatient Prescriptions  Medication Sig Dispense Refill  . Alum & Mag Hydroxide-Simeth (MAGIC MOUTHWASH W/LIDOCAINE) SOLN Take 5 mLs by mouth 4 (four) times daily as needed.  300 mL  2  . b complex vitamins tablet Take 1 tablet by mouth daily.      . carvedilol (COREG) 3.125 MG tablet TAKE 1 TABLET BY MOUTH TWICE DAILY WITH A MEAL  60 tablet  3  . cephALEXin (KEFLEX) 500 MG capsule Take 500 mg by mouth 3 (three) times daily. Extended another week dr. Magnus Ivan 02/09/12      . hyaluronate sodium (RADIAPLEXRX) GEL Apply topically once. Not to apply to skin area 4 hours before rad tx, use after rad tx and bedtime      . HYDROcodone-acetaminophen (NORCO/VICODIN) 5-325 MG per tablet       . HYDROcodone-acetaminophen (NORCO/VICODIN) 5-325 MG per tablet Take 1 tablet by mouth every 6 (six) hours as needed.      [name] KLOR-CON M20 20 MEQ tablet TAKE 1 TABLET BY MOUTH ONCE DAILY AS NEEDED  30 tablet  1  . LORazepam (ATIVAN) 0.5 MG tablet Take 0.5 mg by mouth every 8 (eight) hours.      [name] losartan-hydrochlorothiazide (HYZAAR) 50-12.5 MG per tablet Take 1 tablet by mouth daily.  30 tablet  3  . Multiple Vitamin (MULTIVITAMIN) capsule Take 1 capsule by mouth daily. States only daily-will stop today.      . Multiple Vitamins-Iron (MULTIVITAMINS WITH IRON) TABS Take 1 tablet by mouth daily.      . non-metallic deodorant Thornton Papas) MISC Apply 1 application topically daily as needed. Do not applt 4 hours before rad tx      . omeprazole (PRILOSEC) 40 MG capsule Take 1 capsule (40 mg total) by mouth daily.  30 capsule  12  . prochlorperazine (COMPAZINE) 10 MG tablet Take 1 tablet (10 mg total) by mouth every 6 (six) hours as needed.  30 tablet  2      ALLERGIES: Ace inhibitors   LABORATORY DATA:  Lab Results  Component Value Date   WBC 3.9 03/16/2012   HGB 11.4* 03/16/2012   HCT 35.7 03/16/2012   MCV 92.5 03/16/2012   PLT 202 03/16/2012   Lab Results  Component Value Date   NA 141 02/24/2012   K 3.6 02/24/2012   CL 104 02/24/2012   CO2 25 02/24/2012   Lab Results  Component Value Date   ALT 24 02/24/2012   AST 22 02/24/2012   ALKPHOS 86 02/24/2012   BILITOT 0.40 02/24/2012     NARRATIVE: Linda [name] was seen today for weekly treatment management. The chart was checked and the patient's films were reviewed. The patient is doing well. She notes that she has had some mosquito bites. Otherwise skin doing well and she is using radio plaques cream to the treatment area on a daily basis.  PHYSICAL EXAMINATION: weight is 259 lb 11.2 oz (117.799 kg). Her oral temperature is 98.4 F (36.9 C). Her blood pressure is 123/78 and her pulse is 78. Her respiration is 20.      some diffuse erythema present with some dermatitis emerging in the upper, inner aspect of the treatment field. The supraclavicular region  is a little bit darker but overall her skin looks good for this portion of her treatment. No desquamation.  ASSESSMENT: The patient is doing satisfactorily with treatment.  PLAN: We will continue with the patient's radiation treatment as planned.

## 2012-03-22 ENCOUNTER — Ambulatory Visit
Admission: RE | Admit: 2012-03-22 | Discharge: 2012-03-22 | Disposition: A | Discharge status: Home / Self Care | Payer: Medicare Other | Source: Ambulatory Visit | Attending: Radiation Oncology | Admitting: Radiation Oncology

## 2012-03-23 ENCOUNTER — Other Ambulatory Visit: Payer: Self-pay | Admitting: *Deleted

## 2012-03-23 ENCOUNTER — Ambulatory Visit
Admission: RE | Admit: 2012-03-23 | Discharge: 2012-03-23 | Disposition: A | Discharge status: Home / Self Care | Payer: Medicare Other | Source: Ambulatory Visit | Attending: Radiation Oncology | Admitting: Radiation Oncology

## 2012-03-23 DIAGNOSIS — C50919 Malignant neoplasm of unspecified site of unspecified female breast: Secondary | ICD-10-CM

## 2012-03-24 ENCOUNTER — Ambulatory Visit
Admission: RE | Admit: 2012-03-24 | Discharge: 2012-03-24 | Disposition: A | Discharge status: Home / Self Care | Payer: Medicare Other | Source: Ambulatory Visit | Attending: Radiation Oncology | Admitting: Radiation Oncology

## 2012-03-24 ENCOUNTER — Telehealth: Payer: Self-pay | Admitting: Oncology

## 2012-03-24 ENCOUNTER — Other Ambulatory Visit (HOSPITAL_BASED_OUTPATIENT_CLINIC_OR_DEPARTMENT_OTHER): Payer: Medicare Other | Admitting: Lab

## 2012-03-24 ENCOUNTER — Ambulatory Visit (HOSPITAL_BASED_OUTPATIENT_CLINIC_OR_DEPARTMENT_OTHER): Payer: Medicare Other | Admitting: Oncology

## 2012-03-24 VITALS — BP 137/83 | HR 84 | Temp 98.2°F | Resp 20 | Ht 68.0 in | Wt 261.7 lb

## 2012-03-24 DIAGNOSIS — C50119 Malignant neoplasm of central portion of unspecified female breast: Secondary | ICD-10-CM

## 2012-03-24 DIAGNOSIS — C50919 Malignant neoplasm of unspecified site of unspecified female breast: Secondary | ICD-10-CM

## 2012-03-24 DIAGNOSIS — C773 Secondary and unspecified malignant neoplasm of axilla and upper limb lymph nodes: Secondary | ICD-10-CM

## 2012-03-24 DIAGNOSIS — C50519 Malignant neoplasm of lower-outer quadrant of unspecified female breast: Secondary | ICD-10-CM

## 2012-03-24 DIAGNOSIS — Z170 Estrogen receptor positive status [ER+]: Secondary | ICD-10-CM

## 2012-03-24 LAB — CBC WITH DIFFERENTIAL/PLATELET
Basophils Absolute: 0 10*3/uL (ref 0.0–0.1)
EOS%: 2.2 % (ref 0.0–7.0)
HCT: 36.8 % (ref 34.8–46.6)
HGB: 11.9 g/dL (ref 11.6–15.9)
MONO#: 0.4 10*3/uL (ref 0.1–0.9)
NEUT#: 2.8 10*3/uL (ref 1.5–6.5)
NEUT%: 69.5 % (ref 38.4–76.8)
RDW: 14.7 % — ABNORMAL HIGH (ref 11.2–14.5)
WBC: 4 10*3/uL (ref 3.9–10.3)
lymph#: 0.7 10*3/uL — ABNORMAL LOW (ref 0.9–3.3)

## 2012-03-24 MED ORDER — ANASTROZOLE 1 MG PO TABS: 1.0000 mg | ORAL_TABLET | Freq: Every day | ORAL | Status: DC

## 2012-03-24 NOTE — Telephone Encounter (Signed)
gve the pt her oct-dec 2013 appt calendar along with the appts fro the echo and with dr benshimon and for the bone density at the [facility].[name] gve the pt her feb 2014 appt calendar. Sent michelle a staff message to add more herceptin appts. Pt is aware.

## 2012-03-24 NOTE — Progress Notes (Signed)
ID: Linda [name]   DOB: 1945/10/28  MR#: [id number]  WUJ#:[id number]  HISTORY OF PRESENT ILLNESS: The patient noted a small amount of drainage from her left breast December of 2012. She brought it to her gynecologist's attention in January of 2013 and was set up for bilateral diagnostic mammography at the breast Center July 24, 2011. This was the patient's first ever mammogram. It showed a large irregular mass in the left retroareolar region extending to the nipple, with nipple retraction and skin thickening. This measured approximately 8.4 cm. It was associated with pleomorphic microcalcifications. By exam there was moderate distortion and retraction of the nipple with a large palpable ill-defined area in the retroareolar region. In the right right breast there was also a 2 cm hard mass palpated. Ultrasound of the right breast mass showed a complex cystic/solid area measuring 1.9 cm. Ultrasound of the right axilla was negative. Ultrasound of the left breast showed a large hypoechoic mass measuring at least 3.8 cm. The left axilla showed several adjacent abnormal appearing lymph nodes.  With this information biopsy of the right and left breast masses were obtained the same day, and showed ([id number])  (a) on the right, and invasive ductal carcinoma with papillary features, estrogen and progesterone receptor positive, HER-2 negative, with an MIB-1 of 10%.  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 The MIB-1 was 60% for the left-sided tumor.  With this information the patient was presented at the multidisciplinary breast cancer conference 08/06/2011. Subsequent evaluation and treatments are as detailed below.  INTERVAL HISTORY: Linda [name] returns today for followup of her breast cancer. She is in the middle of her radiation treatments and doing quite well with them. Her hair has come in salt and pepper.  Is not yet quite long enough for her to not wear a wig. She is moving back to [place], and is starting to look for a part-time job. 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  REVIEW OF SYSTEMS: She has had no fatigue and no skin changes. She has no unusual headaches, visual changes, nausea, vomiting, photophobia, or other symptoms suggestive of central nervous system involvement. His been no cough or phlegm production. The rest of the detailed review of systems was entirely negative.  PAST MEDICAL HISTORY: Past Medical History  Diagnosis Date  . Cancer 08-13-11    07-31-11-Dx. Bilarteral Breast cancer-left greater than rt.  . Hematuria, undiagnosed cause 08-13-11    Being evaluated by [facility] 08-14-11  . Hypertension   . Bronchitis     hx  . GERD (gastroesophageal reflux disease)     doing well  . Hematuria - cause not known   . 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 Seroma 02/04/12    right breast  200cc removed  erythema on right side  . Seasonal allergies     PAST SURGICAL HISTORY: Past Surgical History  Procedure Date  . Child birth 08-13-11    x3 -NVD  . Portacath placement 08/15/2011    Procedure: INSERTION PORT-A-CATH;  Surgeon: Shelly Rubenstein, MD;  Location: [facility];  Service: General;  Laterality: N/A;  . Mastectomy w/ sentinel node biopsy 01/21/2012    Procedure: MASTECTOMY WITH SENTINEL LYMPH NODE BIOPSY;  Surgeon: Riley Lam  Warnell Bureau, MD;  Location: [facility];  Service: General;  Laterality: Bilateral;  Left modified radical mastectomy, Rt mastectomy with Sentinel lymphnode biospy  . Breast surgery     FAMILY HISTORY Family History  Problem Relation Age of Onset  . Heart disease Mother   . Cancer Mother 21    breast, , 16 deceased  . Heart attack Father   . Cancer Sister 46    breast    The patient's father died from a heart attack at the age of 75. The patient's mother died from apparently heart problems at the age of 90. The patient had no brothers. She has 3 sisters. One of her sisters was diagnosed with breast cancer in her mid 19s. The patient does not know if his sister was ever genetically tested. The patient's mother had a mastectomy at the age of 90, presumably for breast cancer. There is no other history of breast or in cancer in the family to her knowledge.   GYNECOLOGIC HISTORY:  She does not recall when she had menarche. She had her first child at age 56. She is GX P3. She underwent menopause in her mid 32s. She never took hormone replacement.   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 She moved to [place] about 2 years ago but has a Museum/gallery curator in [place]. Son Thayer Ohm lives in [place] and works as a IT sales professional. His wife is a Engineer, civil (consulting). Son Onalee Hua lives at [place] and is a Art therapist in addition to having the [employer]. The patient attends a local [employer] here   ADVANCED DIRECTIVES: Not in place  HEALTH MAINTENANCE: History  Substance Use Topics  . Smoking status: Never Smoker   . Smokeless tobacco: Never Used  . Alcohol Use: Yes     rare- occ.     Colonoscopy: Never  PAP: Feb 2013  Bone density: Never  Lipid panel:  Allergies  Allergen Reactions  . Ace Inhibitors Cough    Current Outpatient Prescriptions  Medication Sig Dispense Refill  . Alum & Mag Hydroxide-Simeth (MAGIC MOUTHWASH W/LIDOCAINE) SOLN Take 5 mLs by mouth 4 (four) times daily as needed.  300 mL  2  . b complex vitamins tablet Take 1 tablet by mouth daily.      . carvedilol (COREG) 3.125 MG tablet TAKE 1 TABLET BY MOUTH TWICE DAILY WITH A MEAL  60 tablet  3  . hyaluronate sodium  (RADIAPLEXRX) GEL Apply topically once. Not to apply to skin area 4 hours before rad tx, use after rad tx and bedtime      . losartan-hydrochlorothiazide (HYZAAR) 50-12.5 MG per tablet Take 1 tablet by mouth daily.  30 tablet  3  . Multiple Vitamin (MULTIVITAMIN) capsule Take 1 capsule by mouth daily. States only daily-will stop today.      . Multiple Vitamins-Iron (MULTIVITAMINS WITH IRON) TABS Take 1 tablet by mouth daily.      . non-metallic deodorant Thornton Papas) MISC Apply 1 application topically daily as needed. Do not applt 4 hours before rad tx      . HYDROcodone-acetaminophen (NORCO/VICODIN) 5-325 MG per tablet Take 1 tablet by mouth every 6 (six) hours as needed.      [name] LORazepam (ATIVAN) 0.5 MG tablet Take 0.5 mg by mouth every 8 (  eight) hours.      . prochlorperazine (COMPAZINE) 10 MG tablet Take 1 tablet (10 mg total) by mouth every 6 (six) hours as needed.  30 tablet  2    OBJECTIVE: [name] who appears well Filed Vitals:   03/24/12 1457  BP: 137/83  Pulse: 84  Temp: 98.2 F (36.8 C)  Resp: 20     Body mass index is 39.79 kg/(m^2).    ECOG FS: 0  Filed Weights   03/24/12 1457  Weight: 261 lb 11.2 oz (118.706 kg)   Sclerae unicteric Oropharynx clear No cervical or supraclavicular adenopathy Lungs no rales or rhonchi Heart regular rate and rhythm Abd benign MSK no focal spinal tenderness Neuro: nonfocal Breasts: Status post bilateral mastectomies. There is no evidence of recurrence. Both axillae are clear.  LAB RESULTS: Lab Results  Component Value Date   WBC 4.0 03/24/2012   NEUTROABS 2.8 03/24/2012   HGB 11.9 03/24/2012   HCT 36.8 03/24/2012   MCV 91.1 03/24/2012   PLT 198 03/24/2012      Chemistry      Component Value Date/Time   NA 141 02/24/2012 1115   NA 140 02/03/2012 1125   K 3.6 02/24/2012 1115   K 3.5 02/03/2012 1125   CL 104 02/24/2012 1115   CL 102 02/03/2012 1125   CO2 25 02/24/2012 1115   CO2 28 02/03/2012 1125   BUN 8.0 02/24/2012 1115    BUN 12 02/03/2012 1125   CREATININE 0.7 02/24/2012 1115   CREATININE 0.55 02/03/2012 1125      Component Value Date/Time   CALCIUM 9.5 02/24/2012 1115   CALCIUM 8.9 02/03/2012 1125   ALKPHOS 86 02/24/2012 1115   ALKPHOS 99 02/03/2012 1125   AST 22 02/24/2012 1115   AST 35 02/03/2012 1125   ALT 24 02/24/2012 1115   ALT 65* 02/03/2012 1125   BILITOT 0.40 02/24/2012 1115   BILITOT 0.2* 02/03/2012 1125       Lab Results  Component Value Date   LABCA2 87* 08/06/2011    STUDIES:  Echocardiogram, 12/04/11, EF = 55-60%  ASSESSMENT: 66 y.o.  [place] woman   (1)  status post bilateral breast biopsies 07/24/2011, showing,      (a) on the right, a clinical T2 N0 papillary/ductal breast cancer, estrogen and progesterone receptor positive, HER-2 negative, with an MIB-1 of 10%;     (b) on the left, a clinical T3 N1, stage IIIA invasive ductal carcinoma, grade 3, triple positive, with an MIB-1 of 60%.  (2)  Status post 4 dose dense cycles of doxorubicin/ cyclophosphamide followed by 4 dose dense cycles of paclitaxel and trastuzumab completed 12/09/2011  (3) the trastuzumab to be continued for a total of one year (to mid-May 2014); most recent echo 12/04/2011  (4) s/p bilateral mastectomies 01/21/2012 showing   (a) on the Right, an 8 mm invasive papillary carcinoma, grade 1, ypT1b ypN0   (b) on the Left, multiple microscopic foci of residual  Invasive ductal carcinoma with evidence of dermal lymphatic involvement, pyT1a/T4 pyN0  (5)  the patient is receiving postmastectomy radiation on the left, to be completed 04/15/2012  (6) to start anastrozole 04/16/2012  (7) planning on reconstruction sometime 2014  PLAN: Today we discussed antiestrogen therapy, and she will start anastrozole as soon as she completes her radiation treatments. She has a good understanding of the possible toxicities side effects and complications as well as the benefits of this medication. She has never had a bone density  and we are scheduling that for sometime in November. She's also behind on her echocardiogram. That also is being scheduled. Otherwise the plan is for her to continue the trastuzumab every 2 weeks until mid May 2013. She will see [place] again in early February. She knows to call for any problems that may develop before the next visit.  Lillyrose Reitan C    03/24/2012

## 2012-03-25 ENCOUNTER — Ambulatory Visit
Admission: RE | Admit: 2012-03-25 | Discharge: 2012-03-25 | Disposition: A | Discharge status: Home / Self Care | Payer: Medicare Other | Source: Ambulatory Visit | Attending: Radiation Oncology | Admitting: Radiation Oncology

## 2012-03-25 DIAGNOSIS — C50119 Malignant neoplasm of central portion of unspecified female breast: Secondary | ICD-10-CM

## 2012-03-25 MED ORDER — RADIAPLEXRX EX GEL
Freq: Once | CUTANEOUS | Status: AC
  Administered 2012-03-25: 16:00:00 via TOPICAL

## 2012-03-26 ENCOUNTER — Encounter: Payer: Self-pay | Admitting: Radiation Oncology

## 2012-03-26 ENCOUNTER — Ambulatory Visit
Admission: RE | Admit: 2012-03-26 | Discharge: 2012-03-26 | Disposition: A | Discharge status: Home / Self Care | Payer: Medicare Other | Source: Ambulatory Visit | Attending: Radiation Oncology | Admitting: Radiation Oncology

## 2012-03-26 VITALS — BP 120/77 | HR 67 | Resp 18 | Wt 263.6 lb

## 2012-03-26 DIAGNOSIS — L589 Radiodermatitis, unspecified: Secondary | ICD-10-CM

## 2012-03-26 DIAGNOSIS — C50119 Malignant neoplasm of central portion of unspecified female breast: Secondary | ICD-10-CM

## 2012-03-26 HISTORY — DX: Radiodermatitis, unspecified: L58.9

## 2012-03-26 NOTE — Progress Notes (Signed)
Department of Radiation Oncology  Phone:  [phone] Fax:        [phone]  Weekly Treatment Note    Name: Linda [name] Date: 03/26/2012 MRN: [id number] DOB: 01/16/1946   Current dose: 34.2 Gy  Current fraction: 19   MEDICATIONS: Current Outpatient Prescriptions  Medication Sig Dispense Refill  . Alum & Mag Hydroxide-Simeth (MAGIC MOUTHWASH W/LIDOCAINE) SOLN Take 5 mLs by mouth 4 (four) times daily as needed.  300 mL  2  . anastrozole (ARIMIDEX) 1 MG tablet Take 1 tablet (1 mg total) by mouth daily.  90 tablet  12  . b complex vitamins tablet Take 1 tablet by mouth daily.      . carvedilol (COREG) 3.125 MG tablet TAKE 1 TABLET BY MOUTH TWICE DAILY WITH A MEAL  60 tablet  3  . hyaluronate sodium (RADIAPLEXRX) GEL Apply 1 application topically 2 (two) times daily. Not to apply to skin area 4 hours before rad tx, use after rad tx and bedtime      . HYDROcodone-acetaminophen (NORCO/VICODIN) 5-325 MG per tablet Take 1 tablet by mouth every 6 (six) hours as needed.      [name] LORazepam (ATIVAN) 0.5 MG tablet Take 0.5 mg by mouth every 8 (eight) hours.      [name] losartan-hydrochlorothiazide (HYZAAR) 50-12.5 MG per tablet Take 1 tablet by mouth daily.  30 tablet  3  . Multiple Vitamin (MULTIVITAMIN) capsule Take 1 capsule by mouth daily. States only daily-will stop today.      . Multiple Vitamins-Iron (MULTIVITAMINS WITH IRON) TABS Take 1 tablet by mouth daily.      . non-metallic deodorant Thornton Papas) MISC Apply 1 application topically daily as needed. Do not applt 4 hours before rad tx      . prochlorperazine (COMPAZINE) 10 MG tablet Take 1 tablet (10 mg total) by mouth every 6 (six) hours as needed.  30 tablet  2   No current facility-administered medications for this encounter.   Facility-Administered Medications Ordered in Other Encounters  Medication Dose Route Frequency Provider Last Rate Last Dose  . hyaluronate sodium (RADIAPLEXRX) gel   Topical Once Jonna Coup, MD          ALLERGIES: Ace inhibitors   LABORATORY DATA:  Lab Results  Component Value Date   WBC 4.0 03/24/2012   HGB 11.9 03/24/2012   HCT 36.8 03/24/2012   MCV 91.1 03/24/2012   PLT 198 03/24/2012   Lab Results  Component Value Date   NA 141 02/24/2012   K 3.6 02/24/2012   CL 104 02/24/2012   CO2 25 02/24/2012   Lab Results  Component Value Date   ALT 24 02/24/2012   AST 22 02/24/2012   ALKPHOS 86 02/24/2012   BILITOT 0.40 02/24/2012     NARRATIVE: Linda [name] was seen today for weekly treatment management. The chart was checked and the patient's films were reviewed. The patient is doing well. She has noticed several small blisters. No major change in skin irritation.  PHYSICAL EXAMINATION: weight is 263 lb 9.6 oz (119.568 kg). Her blood pressure is 120/77 and her pulse is 67. Her respiration is 18.      diffuse hyperpigmentation. There are several very small blisters laterally within the chest wall as well as posteriorly. No moist desquamation.  ASSESSMENT: The patient is doing satisfactorily with treatment.  PLAN: We will continue with the patient's radiation treatment as planned. The patient will continue her skin care and used Neosporin on the affected areas.

## 2012-03-26 NOTE — Progress Notes (Signed)
Received patient in the clinic today unaccompanied for PUT with Dr. Mitzi Hansen. Patient is alert and oriented to person, place, and time. No distress noted. Steady gait noted. Pleasant affect noted. Patient denies pain at this time. Hyperpigmentation of left chest wall noted three small blisters one anterior and the other two posterior left chest wall. Encouraged patient to apply neosporin to blisters and refrain from popping the blister open. Encouraged patient to continue to use radiaplex gel bid. Reported all findings to Dr. Mitzi Hansen.

## 2012-03-29 ENCOUNTER — Ambulatory Visit
Admission: RE | Admit: 2012-03-29 | Discharge: 2012-03-29 | Disposition: A | Discharge status: Home / Self Care | Payer: Medicare Other | Source: Ambulatory Visit | Attending: Radiation Oncology | Admitting: Radiation Oncology

## 2012-03-30 ENCOUNTER — Ambulatory Visit
Admission: RE | Admit: 2012-03-30 | Discharge: 2012-03-30 | Disposition: A | Discharge status: Home / Self Care | Payer: Medicare Other | Source: Ambulatory Visit | Attending: Radiation Oncology | Admitting: Radiation Oncology

## 2012-03-31 ENCOUNTER — Ambulatory Visit
Admission: RE | Admit: 2012-03-31 | Discharge: 2012-03-31 | Disposition: A | Discharge status: Home / Self Care | Payer: Medicare Other | Source: Ambulatory Visit | Attending: Radiation Oncology | Admitting: Radiation Oncology

## 2012-04-01 ENCOUNTER — Ambulatory Visit
Admission: RE | Admit: 2012-04-01 | Discharge: 2012-04-01 | Disposition: A | Discharge status: Home / Self Care | Payer: Medicare Other | Source: Ambulatory Visit | Attending: Radiation Oncology | Admitting: Radiation Oncology

## 2012-04-02 ENCOUNTER — Ambulatory Visit
Admission: RE | Admit: 2012-04-02 | Discharge: 2012-04-02 | Disposition: A | Discharge status: Home / Self Care | Payer: Medicare Other | Source: Ambulatory Visit | Attending: Radiation Oncology | Admitting: Radiation Oncology

## 2012-04-02 ENCOUNTER — Encounter: Payer: Self-pay | Admitting: Radiation Oncology

## 2012-04-02 VITALS — BP 153/79 | HR 88 | Temp 98.3°F | Resp 20 | Wt 261.0 lb

## 2012-04-02 DIAGNOSIS — C50119 Malignant neoplasm of central portion of unspecified female breast: Secondary | ICD-10-CM

## 2012-04-02 DIAGNOSIS — L589 Radiodermatitis, unspecified: Secondary | ICD-10-CM

## 2012-04-02 MED ORDER — BIAFINE EX EMUL
CUTANEOUS | Status: DC | PRN
  Administered 2012-04-02: 15:00:00 via TOPICAL

## 2012-04-02 NOTE — Progress Notes (Signed)
Patient here for weekly rad txs< left chestwall 12/14 ,/subclavian, 24/28 completed, bright erythema on left chest wall, subclavian, and around axilla, raw looking, thinning, skin intact, no c/opain, just increased itching, offered biafine cream, uncomfortable to have anything up on subclavian left side, starting to get feeling back where numbness was under axilla states, eating and drinking well 2:47 PM

## 2012-04-02 NOTE — Progress Notes (Signed)
  Radiation Oncology         [phone] ________________________________  Name: Linda [name] MRN: [id number]  Date: 04/02/2012  DOB: 1946/03/01  Weekly Radiation Therapy Management  Current Dose: 43.2 Gy     Planned Dose:  60.4 Gy  Narrative . . . . . . . . The patient presents for routine under treatment assessment.                                                  Patient here for weekly rad txs< left chestwall 12/14 ,/subclavian, 24/28 completed, bright erythema on left chest wall, subclavian, and around axilla, raw looking, thinning, skin intact, no c/opain, just increased itching, offered biafine cream, uncomfortable to have anything up on subclavian left side, starting to get feeling back where numbness was under axilla states, eating and drinking well    The patient is without complaint.                                 Set-up films were reviewed.                                 The chart was checked. Physical Findings. . .  weight is 261 lb (118.389 kg). Her oral temperature is 98.3 F (36.8 C). Her blood pressure is 153/79 and her pulse is 88. Her respiration is 20.   Pronounced erythema in field.  No desq. Weight essentially stable.  No significant changes. Impression . . . . . . . The patient is  tolerating radiation. Plan . . . . . . . . . . . . Continue treatment as planned.  ________________________________  Linda [name]. Linda [name], M.D.

## 2012-04-05 ENCOUNTER — Ambulatory Visit
Admission: RE | Admit: 2012-04-05 | Discharge: 2012-04-05 | Disposition: A | Discharge status: Home / Self Care | Payer: Medicare Other | Source: Ambulatory Visit | Attending: Radiation Oncology | Admitting: Radiation Oncology

## 2012-04-06 ENCOUNTER — Ambulatory Visit
Admission: RE | Admit: 2012-04-06 | Discharge: 2012-04-06 | Disposition: A | Discharge status: Home / Self Care | Payer: Medicare Other | Source: Ambulatory Visit | Attending: Radiation Oncology | Admitting: Radiation Oncology

## 2012-04-06 ENCOUNTER — Ambulatory Visit (HOSPITAL_BASED_OUTPATIENT_CLINIC_OR_DEPARTMENT_OTHER): Payer: Medicare Other

## 2012-04-06 ENCOUNTER — Other Ambulatory Visit: Payer: Medicare Other | Admitting: Lab

## 2012-04-06 ENCOUNTER — Encounter: Payer: Self-pay | Admitting: Radiation Oncology

## 2012-04-06 VITALS — BP 128/62 | HR 74 | Temp 98.0°F | Resp 20

## 2012-04-06 DIAGNOSIS — C801 Malignant (primary) neoplasm, unspecified: Secondary | ICD-10-CM

## 2012-04-06 DIAGNOSIS — C50919 Malignant neoplasm of unspecified site of unspecified female breast: Secondary | ICD-10-CM

## 2012-04-06 DIAGNOSIS — C50519 Malignant neoplasm of lower-outer quadrant of unspecified female breast: Secondary | ICD-10-CM

## 2012-04-06 DIAGNOSIS — C50119 Malignant neoplasm of central portion of unspecified female breast: Secondary | ICD-10-CM

## 2012-04-06 DIAGNOSIS — L589 Radiodermatitis, unspecified: Secondary | ICD-10-CM

## 2012-04-06 DIAGNOSIS — C773 Secondary and unspecified malignant neoplasm of axilla and upper limb lymph nodes: Secondary | ICD-10-CM

## 2012-04-06 DIAGNOSIS — Z5112 Encounter for antineoplastic immunotherapy: Secondary | ICD-10-CM

## 2012-04-06 MED ORDER — HEPARIN SOD (PORK) LOCK FLUSH 100 UNIT/ML IV SOLN
500.0000 [IU] | Freq: Once | INTRAVENOUS | Status: AC | PRN
  Administered 2012-04-06: 500 [IU]
  Filled 2012-04-06: qty 5

## 2012-04-06 MED ORDER — SILVER SULFADIAZINE 1 % EX CREA
TOPICAL_CREAM | Freq: Two times a day (BID) | CUTANEOUS | Status: DC
  Administered 2012-04-06: 1 via TOPICAL

## 2012-04-06 MED ORDER — DIPHENHYDRAMINE HCL 25 MG PO CAPS
25.0000 mg | ORAL_CAPSULE | Freq: Once | ORAL | Status: AC
  Administered 2012-04-06: 25 mg via ORAL

## 2012-04-06 MED ORDER — ACETAMINOPHEN 325 MG PO TABS
650.0000 mg | ORAL_TABLET | Freq: Once | ORAL | Status: AC
  Administered 2012-04-06: 650 mg via ORAL

## 2012-04-06 MED ORDER — SODIUM CHLORIDE 0.9 % IJ SOLN
10.0000 mL | INTRAMUSCULAR | Status: DC | PRN
  Administered 2012-04-06: 10 mL
  Filled 2012-04-06: qty 10

## 2012-04-06 MED ORDER — SODIUM CHLORIDE 0.9 % IV SOLN
Freq: Once | INTRAVENOUS | Status: AC
  Administered 2012-04-06: 12:00:00 via INTRAVENOUS

## 2012-04-06 MED ORDER — TRASTUZUMAB CHEMO INJECTION 440 MG
6.0000 mg/kg | Freq: Once | INTRAVENOUS | Status: AC
  Administered 2012-04-06: 735 mg via INTRAVENOUS
  Filled 2012-04-06: qty 35

## 2012-04-06 NOTE — Progress Notes (Addendum)
Pt in nursing after radiation tx today c/o "burning of skin" in tx area of left chest wall. Pt has been applying Biafine lotion several x daily for relief, gets 2 hrs relief of burning. Chest wall very hyperpigmented, some dry desquamation noted. Pt has not taken any meds for pain. Pt also has small area moist desquamation in left axilla; she states she has no feeling there, so it is not painful. Advised pt she may take Hydrocodone prn for discomfort. Pt to see Dr Roselind Messier for possibility of need for Silvadene. Per MD verbal order, silvadene 2 jars of 50g silvadene given to patient applied over affected area left chest wall/axilla, subclavian, placed telfa over site, mesh tube top to secure, patient informed to apply bid, but to remove  Cream before re-applying to skin, wash hands thoroughly  Before and after applying the silvadene, weash mesh tube top with warm sooap/water,air dry

## 2012-04-06 NOTE — Progress Notes (Signed)
[facility], [facility]    Radiation Oncology [address] [place]     Linda [name], M.D. [place], [place] [address]               Linda [name], M.D., Ph.D. Phone: [phone]      Molli Hazard A. Kathrynn Running, M.D. Fax: [phone]      Radene Gunning, M.D., Ph.D.         Lurline Hare, M.D.         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  Current Dose: 4680 cGy  Current Fraction: 26  Planned Dose: 6040 cGy  Narrative: Linda [name] was seen today for weekly treatment management. The chart was checked and port films  were reviewed. She asked to be seen today for complaints of a burning sensation along the chest and the left neck area. the area most bothersome to the patient is along the left clavicle region.  Ace inhibitors Current Outpatient Prescriptions  Medication Sig Dispense Refill  . Alum & Mag Hydroxide-Simeth (MAGIC MOUTHWASH W/LIDOCAINE) SOLN Take 5 mLs by mouth 4 (four) times daily as needed.  300 mL  2  . anastrozole (ARIMIDEX) 1 MG tablet Take 1 tablet (1 mg total) by mouth daily.  90 tablet  12  . b complex vitamins tablet Take 1 tablet by mouth daily.      . carvedilol (COREG) 3.125 MG tablet TAKE 1 TABLET BY MOUTH TWICE DAILY WITH A MEAL  60 tablet  3  . emollient (BIAFINE) cream Apply 1 application topically 2 (two) times daily. Apply to affected skin areas after rad tx and bedtime and prn, just not 4 hours prior to rad txs      . hyaluronate sodium (RADIAPLEXRX) GEL Apply 1 application topically 2 (two) times daily. Not to apply to skin area 4 hours before rad tx, use after rad tx and bedtime      .  HYDROcodone-acetaminophen (NORCO/VICODIN) 5-325 MG per tablet Take 1 tablet by mouth every 6 (six) hours as needed.      [name] LORazepam (ATIVAN) 0.5 MG tablet Take 0.5 mg by mouth every 8 (eight) hours.      [name] losartan-hydrochlorothiazide (HYZAAR) 50-12.5 MG per tablet Take 1 tablet by mouth daily.  30 tablet  3  . Multiple Vitamin (MULTIVITAMIN) capsule Take 1 capsule by mouth daily. States only daily-will stop today.      . Multiple Vitamins-Iron (MULTIVITAMINS WITH IRON) TABS Take 1 tablet by mouth daily.      . non-metallic deodorant Thornton Papas) MISC Apply 1 application topically daily as needed. Do not applt 4 hours before rad tx      . prochlorperazine (COMPAZINE) 10 MG tablet Take 1 tablet (10 mg total) by mouth every 6 (six) hours as needed.  30 tablet  2   Current Facility-Administered Medications  Medication Dose Route Frequency Provider Last Rate Last Dose  . silver sulfADIAZINE (SILVADENE) 1 % cream   Topical BID Linda Lade, MD      . silver sulfADIAZINE (SILVADENE) 1 % cream  Topical BID Linda Lade, MD       Facility-Administered Medications Ordered in Other Encounters  Medication Dose Route Frequency Provider Last Rate Last Dose  . 0.9 %  sodium chloride infusion   Intravenous Once Lowella Dell, MD      . acetaminophen (TYLENOL) tablet 650 mg  650 mg Oral Once Lowella Dell, MD   650 mg at 04/06/12 1150  . diphenhydrAMINE (BENADRYL) capsule 25 mg  25 mg Oral Once Lowella Dell, MD   25 mg at 04/06/12 1150  . heparin lock flush 100 unit/mL  500 Units Intracatheter Once PRN Lowella Dell, MD   500 Units at 04/06/12 1247  . trastuzumab (HERCEPTIN) 735 mg in sodium chloride 0.9 % 250 mL chemo infusion  6 mg/kg (Treatment Plan Actual) Intravenous Once Lowella Dell, MD   735 mg at 04/06/12 1205  . DISCONTD: sodium chloride 0.9 % injection 10 mL  10 mL Intracatheter PRN Lowella Dell, MD   10 mL at 04/06/12 1247   Labs:  Lab Results  Component Value Date    WBC 4.0 03/24/2012   HGB 11.9 03/24/2012   HCT 36.8 03/24/2012   MCV 91.1 03/24/2012   PLT 198 03/24/2012   Lab Results  Component Value Date   CREATININE 0.7 02/24/2012   BUN 8.0 02/24/2012   NA 141 02/24/2012   K 3.6 02/24/2012   CL 104 02/24/2012   CO2 25 02/24/2012   Lab Results  Component Value Date   ALT 24 02/24/2012   AST 22 02/24/2012   BILITOT 0.40 02/24/2012    Physical Examination:  oral temperature is 98 F (36.7 C). Her blood pressure is 128/62 and her pulse is 74. Her respiration is 20.    Wt Readings from Last 3 Encounters:  04/02/12 261 lb (118.389 kg)  03/26/12 263 lb 9.6 oz (119.568 kg)  03/24/12 261 lb 11.2 oz (118.706 kg)     Lungs - Normal respiratory effort, chest expands symmetrically. Lungs are clear to auscultation, no crackles or wheezes.  Heart has regular rhythm and rate  Abdomen is soft and non tender with normal bowel sounds Brisk erythema throughout the treatment area with dry desquamation. There is impending moist desquamation in several areas.  Assessment:  Patient is having side effects with the treatment as above. She has been given Silvadene to place on her skin. She is having a lot of itching and may need to use hydrocortisone cream but will defer to Dr. Joellen Jersey evaluation later this week  Plan: Continue treatment per original radiation prescription

## 2012-04-07 ENCOUNTER — Ambulatory Visit
Admission: RE | Admit: 2012-04-07 | Discharge: 2012-04-07 | Disposition: A | Discharge status: Home / Self Care | Payer: Medicare Other | Source: Ambulatory Visit | Attending: Radiation Oncology | Admitting: Radiation Oncology

## 2012-04-07 ENCOUNTER — Ambulatory Visit: Payer: Medicare Other

## 2012-04-08 ENCOUNTER — Ambulatory Visit: Payer: Medicare Other

## 2012-04-08 ENCOUNTER — Ambulatory Visit
Admission: RE | Admit: 2012-04-08 | Discharge: 2012-04-08 | Disposition: A | Discharge status: Home / Self Care | Payer: Medicare Other | Source: Ambulatory Visit | Attending: Radiation Oncology | Admitting: Radiation Oncology

## 2012-04-09 ENCOUNTER — Ambulatory Visit
Admission: RE | Admit: 2012-04-09 | Discharge: 2012-04-09 | Disposition: A | Discharge status: Home / Self Care | Payer: Medicare Other | Source: Ambulatory Visit | Attending: Radiation Oncology | Admitting: Radiation Oncology

## 2012-04-09 ENCOUNTER — Encounter: Payer: Self-pay | Admitting: Radiation Oncology

## 2012-04-09 ENCOUNTER — Ambulatory Visit: Payer: Medicare Other

## 2012-04-09 VITALS — BP 142/66 | HR 87 | Temp 97.8°F | Resp 20 | Wt 259.8 lb

## 2012-04-09 DIAGNOSIS — C50119 Malignant neoplasm of central portion of unspecified female breast: Secondary | ICD-10-CM

## 2012-04-09 NOTE — Progress Notes (Signed)
Per Dr Roselind Messier, pt applying Silvadene in tx area bid, area nearest top of left neck healing and dry. She states she gets good relief of skin irritation/burning w/Silvadene. Area is painful during day between applications of Silvadene. Pt sleeping longer at night, denies loss of appetite.

## 2012-04-09 NOTE — Progress Notes (Signed)
Department of Radiation Oncology  Phone:  [phone] Fax:        [phone]  Weekly Treatment Note    Name: Linda [name] Date: 04/09/2012 MRN: [id number] DOB: Dec 20, 1945   Current dose: 52.4 Gy  Current fraction: 29   MEDICATIONS: Current Outpatient Prescriptions  Medication Sig Dispense Refill  . Alum & Mag Hydroxide-Simeth (MAGIC MOUTHWASH W/LIDOCAINE) SOLN Take 5 mLs by mouth 4 (four) times daily as needed.  300 mL  2  . anastrozole (ARIMIDEX) 1 MG tablet Take 1 tablet (1 mg total) by mouth daily.  90 tablet  12  . b complex vitamins tablet Take 1 tablet by mouth daily.      . carvedilol (COREG) 3.125 MG tablet TAKE 1 TABLET BY MOUTH TWICE DAILY WITH A MEAL  60 tablet  3  . emollient (BIAFINE) cream Apply 1 application topically 2 (two) times daily. Apply to affected skin areas after rad tx and bedtime and prn, just not 4 hours prior to rad txs      . hyaluronate sodium (RADIAPLEXRX) GEL Apply 1 application topically 2 (two) times daily. Not to apply to skin area 4 hours before rad tx, use after rad tx and bedtime      . HYDROcodone-acetaminophen (NORCO/VICODIN) 5-325 MG per tablet Take 1 tablet by mouth every 6 (six) hours as needed.      [name] LORazepam (ATIVAN) 0.5 MG tablet Take 0.5 mg by mouth every 8 (eight) hours.      [name] losartan-hydrochlorothiazide (HYZAAR) 50-12.5 MG per tablet Take 1 tablet by mouth daily.  30 tablet  3  . Multiple Vitamin (MULTIVITAMIN) capsule Take 1 capsule by mouth daily. States only daily-will stop today.      . Multiple Vitamins-Iron (MULTIVITAMINS WITH IRON) TABS Take 1 tablet by mouth daily.      . non-metallic deodorant Thornton Papas) MISC Apply 1 application topically daily as needed. Do not applt 4 hours before rad tx      . prochlorperazine (COMPAZINE) 10 MG tablet Take 1 tablet (10 mg total) by mouth every 6 (six) hours as needed.  30 tablet  2  . silver sulfADIAZINE (SILVADENE) 1 % cream Apply 1 application topically daily. Apply to left  chest wall,supraclavicular area and under axilla bid , wash hands with soap and water before applying to skin, to remove cream before re-applying cream , do not apply 4 hours prior to rad tx         ALLERGIES: Ace inhibitors   LABORATORY DATA:  Lab Results  Component Value Date   WBC 4.0 03/24/2012   HGB 11.9 03/24/2012   HCT 36.8 03/24/2012   MCV 91.1 03/24/2012   PLT 198 03/24/2012   Lab Results  Component Value Date   NA 141 02/24/2012   K 3.6 02/24/2012   CL 104 02/24/2012   CO2 25 02/24/2012   Lab Results  Component Value Date   ALT 24 02/24/2012   AST 22 02/24/2012   ALKPHOS 86 02/24/2012   BILITOT 0.40 02/24/2012     NARRATIVE: Linda [name] was seen today for weekly treatment management. The chart was checked and the patient's films were reviewed. The patient is doing better. She began Silvadene for skin irritation. She feels that this has been sitting and that her skin has healed up some since she began this.  PHYSICAL EXAMINATION: weight is 259 lb 12.8 oz (117.845 kg). Her oral temperature is 97.8 F (36.6 C). Her blood pressure is 142/66 and her  pulse is 87. Her respiration is 20.      significant skin irritation is present, especially in the supraclavicular region and the upper axilla. There is been some desquamation. Not much moistness present.  ASSESSMENT: The patient is doing satisfactorily with treatment, doing better with her current skin care regimen. She has experienced some significant irritation but she began her boost treatment today.  PLAN: We will continue with the patient's radiation treatment as planned. She will continue her current skin care and I don't believe that any changes are necessary at this point.

## 2012-04-12 ENCOUNTER — Ambulatory Visit: Payer: Medicare Other

## 2012-04-12 ENCOUNTER — Ambulatory Visit
Admission: RE | Admit: 2012-04-12 | Discharge: 2012-04-12 | Disposition: A | Discharge status: Home / Self Care | Payer: Medicare Other | Source: Ambulatory Visit | Attending: Radiation Oncology | Admitting: Radiation Oncology

## 2012-04-13 ENCOUNTER — Ambulatory Visit: Payer: Medicare Other

## 2012-04-13 ENCOUNTER — Ambulatory Visit
Admission: RE | Admit: 2012-04-13 | Discharge: 2012-04-13 | Disposition: A | Discharge status: Home / Self Care | Payer: Medicare Other | Source: Ambulatory Visit | Attending: Radiation Oncology | Admitting: Radiation Oncology

## 2012-04-14 ENCOUNTER — Ambulatory Visit: Payer: Medicare Other

## 2012-04-14 ENCOUNTER — Ambulatory Visit
Admission: RE | Admit: 2012-04-14 | Discharge: 2012-04-14 | Disposition: A | Discharge status: Home / Self Care | Payer: Medicare Other | Source: Ambulatory Visit | Attending: Radiation Oncology | Admitting: Radiation Oncology

## 2012-04-15 ENCOUNTER — Ambulatory Visit
Admission: RE | Admit: 2012-04-15 | Discharge: 2012-04-15 | Disposition: A | Discharge status: Home / Self Care | Payer: Medicare Other | Source: Ambulatory Visit | Attending: Radiation Oncology | Admitting: Radiation Oncology

## 2012-04-15 ENCOUNTER — Ambulatory Visit: Payer: Medicare Other

## 2012-04-15 ENCOUNTER — Encounter: Payer: Self-pay | Admitting: Radiation Oncology

## 2012-04-15 VITALS — BP 140/69 | HR 76 | Temp 98.1°F | Wt 262.1 lb

## 2012-04-15 DIAGNOSIS — C50119 Malignant neoplasm of central portion of unspecified female breast: Secondary | ICD-10-CM

## 2012-04-15 MED ORDER — SILVER SULFADIAZINE 1 % EX CREA
TOPICAL_CREAM | Freq: Two times a day (BID) | CUTANEOUS | Status: DC
  Administered 2012-04-15: 1 via TOPICAL

## 2012-04-15 MED ORDER — BIAFINE EX EMUL
CUTANEOUS | Status: DC | PRN
  Administered 2012-04-15: 1 via TOPICAL

## 2012-04-15 NOTE — Progress Notes (Signed)
Department of Radiation Oncology  Phone:  [phone] Fax:        [phone]  Weekly Treatment Note    Name: Linda [name] Date: 04/15/2012 MRN: [id number] DOB: 10-26-1945   Current dose: 60.4 Gy  Current fraction: 33   MEDICATIONS: Current Outpatient Prescriptions  Medication Sig Dispense Refill  . Alum & Mag Hydroxide-Simeth (MAGIC MOUTHWASH W/LIDOCAINE) SOLN Take 5 mLs by mouth 4 (four) times daily as needed.  300 mL  2  . anastrozole (ARIMIDEX) 1 MG tablet Take 1 tablet (1 mg total) by mouth daily.  90 tablet  12  . b complex vitamins tablet Take 1 tablet by mouth daily.      . carvedilol (COREG) 3.125 MG tablet TAKE 1 TABLET BY MOUTH TWICE DAILY WITH A MEAL  60 tablet  3  . emollient (BIAFINE) cream Apply 1 application topically 2 (two) times daily. Apply to affected skin areas after rad tx and bedtime and prn, just not 4 hours prior to rad txs      . hyaluronate sodium (RADIAPLEXRX) GEL Apply 1 application topically 2 (two) times daily. Not to apply to skin area 4 hours before rad tx, use after rad tx and bedtime      . HYDROcodone-acetaminophen (NORCO/VICODIN) 5-325 MG per tablet Take 1 tablet by mouth every 6 (six) hours as needed.      [name] LORazepam (ATIVAN) 0.5 MG tablet Take 0.5 mg by mouth every 8 (eight) hours.      [name] losartan-hydrochlorothiazide (HYZAAR) 50-12.5 MG per tablet Take 1 tablet by mouth daily.  30 tablet  3  . Multiple Vitamin (MULTIVITAMIN) capsule Take 1 capsule by mouth daily. States only daily-will stop today.      . Multiple Vitamins-Iron (MULTIVITAMINS WITH IRON) TABS Take 1 tablet by mouth daily.      . non-metallic deodorant Thornton Papas) MISC Apply 1 application topically daily as needed. Do not applt 4 hours before rad tx      . prochlorperazine (COMPAZINE) 10 MG tablet Take 1 tablet (10 mg total) by mouth every 6 (six) hours as needed.  30 tablet  2  . silver sulfADIAZINE (SILVADENE) 1 % cream Apply 1 application topically daily. Apply to left  chest wall,supraclavicular area and under axilla bid , wash hands with soap and water before applying to skin, to remove cream before re-applying cream , do not apply 4 hours prior to rad tx         ALLERGIES: Ace inhibitors   LABORATORY DATA:  Lab Results  Component Value Date   WBC 4.0 03/24/2012   HGB 11.9 03/24/2012   HCT 36.8 03/24/2012   MCV 91.1 03/24/2012   PLT 198 03/24/2012   Lab Results  Component Value Date   NA 141 02/24/2012   K 3.6 02/24/2012   CL 104 02/24/2012   CO2 25 02/24/2012   Lab Results  Component Value Date   ALT 24 02/24/2012   AST 22 02/24/2012   ALKPHOS 86 02/24/2012   BILITOT 0.40 02/24/2012     NARRATIVE: Linda [name] was seen today for weekly treatment management. The chart was checked and the patient's films were reviewed. The patient is doing well. Her skin is actually begun to heal some on Silvadene. She is pleased with this. No new worsening areas.  PHYSICAL EXAMINATION: weight is 262 lb 1.6 oz (118.888 kg). Her temperature is 98.1 F (36.7 C). Her blood pressure is 140/69 and her pulse is 76.  the patient's skin has begun to heal as described above by the patient. Really no moist desquamation at this point. Some residual bright erythema and desquamation remains but this overall looks quite a bit better.  ASSESSMENT: The patient did satisfactorily with treatment.  PLAN: She will continue her current skin care for another week and then begin using Biafine for another 1-2 weeks. She will followup in one month.

## 2012-04-15 NOTE — Progress Notes (Signed)
Patient completes radiation to left chest wall.Healing has already begun.Will give additional jar of silvadene to apply until healed and then biafine to apply thereafter.Denies pain or fatigue.To schedule follow up in next 2 weeks to one month with Dr.Moody.

## 2012-04-22 ENCOUNTER — Ambulatory Visit (HOSPITAL_BASED_OUTPATIENT_CLINIC_OR_DEPARTMENT_OTHER)
Admission: RE | Admit: 2012-04-22 | Discharge: 2012-04-22 | Disposition: A | Discharge status: Home / Self Care | Payer: Medicare Other | Source: Ambulatory Visit | Attending: Internal Medicine | Admitting: Internal Medicine

## 2012-04-22 ENCOUNTER — Ambulatory Visit (HOSPITAL_COMMUNITY)
Admission: RE | Admit: 2012-04-22 | Discharge: 2012-04-22 | Disposition: A | Discharge status: Home / Self Care | Payer: Medicare Other | Source: Ambulatory Visit | Attending: Obstetrics and Gynecology | Admitting: Obstetrics and Gynecology

## 2012-04-22 ENCOUNTER — Encounter (HOSPITAL_COMMUNITY): Payer: Self-pay

## 2012-04-22 VITALS — BP 128/76 | HR 82 | Wt 251.8 lb

## 2012-04-22 DIAGNOSIS — I517 Cardiomegaly: Secondary | ICD-10-CM

## 2012-04-22 DIAGNOSIS — I10 Essential (primary) hypertension: Secondary | ICD-10-CM | POA: Insufficient documentation

## 2012-04-22 DIAGNOSIS — C50119 Malignant neoplasm of central portion of unspecified female breast: Secondary | ICD-10-CM

## 2012-04-22 DIAGNOSIS — C50919 Malignant neoplasm of unspecified site of unspecified female breast: Secondary | ICD-10-CM | POA: Insufficient documentation

## 2012-04-22 NOTE — Patient Instructions (Signed)
Follow up end of January with echo.

## 2012-04-22 NOTE — Progress Notes (Signed)
  Echocardiogram 2D Echocardiogram has been performed.  Artist Bloom FRANCES 04/22/2012, 2:55 PM

## 2012-04-25 NOTE — Progress Notes (Signed)
Oncologist: Dr Darnelle Catalan  HPI:  Linda [name] is a [name] status post bilateral breast biopsies 07/24/2011, showing, on the right, a clinical T2 N0 papillary/ductal breast cancer, estrogen and progesterone receptor positive, HER-2 negative, with an MIB-1 of 10%; on the left, a T3 N1 invasive ductal carcinoma, grade 3, triple positive, with an MIB-1 of 60%. She will begin  cyclophosphamide and doxorubicin  in dose dense fashion x4, to be followed by paclitaxel and trastuzumab again every 2 weeks x4, with the trastuzumab continue to complete a year. When she completes the 8 weeks of chemotherapy as she will be ready for surgery.   Echos: 08/20/11 EF 60-65% Lateral S' 8.5 mod LVH  12/04/11 EF 60-65% lateral s' 7.9  Mild LVH (poor window) 04/22/12 EF 55-60% lateral s' 7.0 (poor window)  Returns for routine follow up today.  Feels well today. Denies SOB/orthopnea/PND.  No fever/chills.  She had been on hyzaar in April and is unsure why she is not taking it anymore.  Tolerating therapy well    Review of Systems: All pertinent positives and negatives as in HPI, otherwise negative.   Past Medical History  Diagnosis Date  . Cancer 08-13-11    07-31-11-Dx. Bilarteral Breast cancer-left greater than rt.  . Hematuria, undiagnosed cause 08-13-11    Being evaluated by [facility] 08-14-11  . Hypertension   . Bronchitis     hx  . GERD (gastroesophageal reflux disease)     doing well  . Hematuria - cause not known   . 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 Seroma 02/04/12    right breast  200cc removed  erythema on right side  . Seasonal allergies     Current Outpatient Prescriptions  Medication Sig Dispense Refill  . anastrozole (ARIMIDEX) 1 MG tablet Take 1 tablet (1 mg total) by mouth daily.  90 tablet  12  . b complex vitamins tablet Take 1 tablet by mouth daily.        . carvedilol (COREG) 3.125 MG tablet TAKE 1 TABLET BY MOUTH TWICE DAILY WITH A MEAL  60 tablet  3  . losartan-hydrochlorothiazide (HYZAAR) 50-12.5 MG per tablet Take 1 tablet by mouth daily.  30 tablet  3  . Multiple Vitamin (MULTIVITAMIN) capsule Take 1 capsule by mouth daily. States only daily-will stop today.      . Multiple Vitamins-Iron (MULTIVITAMINS WITH IRON) TABS Take 1 tablet by mouth daily.      . non-metallic deodorant Thornton Papas) MISC Apply 1 application topically daily as needed. Do not applt 4 hours before rad tx      . Alum & Mag Hydroxide-Simeth (MAGIC MOUTHWASH W/LIDOCAINE) SOLN Take 5 mLs by mouth 4 (four) times daily as needed.  300 mL  2  . emollient (BIAFINE) cream Apply 1 application topically 2 (two) times daily. Apply to affected skin areas after rad tx and bedtime and prn, just not 4 hours prior to rad txs      . hyaluronate sodium (RADIAPLEXRX) GEL Apply 1 application topically 2 (two) times daily. Not to apply to skin area 4 hours before rad tx, use after rad tx and bedtime      . HYDROcodone-acetaminophen (NORCO/VICODIN) 5-325 MG per tablet Take 1 tablet by mouth every 6 (six) hours as needed.      [name] LORazepam (ATIVAN) 0.5 MG tablet Take 0.5 mg by mouth  every 8 (eight) hours.      . prochlorperazine (COMPAZINE) 10 MG tablet Take 1 tablet (10 mg total) by mouth every 6 (six) hours as needed.  30 tablet  2  . silver sulfADIAZINE (SILVADENE) 1 % cream Apply 1 application topically daily. Apply to left chest wall,supraclavicular area and under axilla bid , wash hands with soap and water before applying to skin, to remove cream before re-applying cream , do not apply 4 hours prior to rad tx         Allergies  Allergen Reactions  . Ace Inhibitors Cough     PHYSICAL EXAM: Filed Vitals:   04/22/12 1455  BP: 128/76  Pulse: 82  Weight: 251 lb 12.8 oz (114.216 kg)  SpO2: 95%   General:  Obese. Well appearing. No respiratory difficulty HEENT: normal Neck: supple. JVD  difficult to assess. Carotids 2+ bilat; no bruits. No lymphadenopathy or thryomegaly appreciated. Cor: PMI nondisplaced. Regular rate & rhythm. No rubs, gallops or murmurs. Lungs: clear Abdomen: soft, obese nontender, nondistended.No bruits or masses. Good bowel sounds. Extremities: no cyanosis, clubbing, rash, edema Neuro: alert & oriented x 3, cranial nerves grossly intact. moves all 4 extremities w/o difficulty. Affect pleasant.  ASSESSMENT & PLAN:

## 2012-04-25 NOTE — Assessment & Plan Note (Signed)
I reviewed echos personally. EF stable. Lateral s' may be slightly depressed but images are quite poor. No HF on exam. Will continue Herceptin. Recheck echo in 2 weeks.

## 2012-04-26 ENCOUNTER — Other Ambulatory Visit: Payer: Self-pay | Admitting: *Deleted

## 2012-04-26 DIAGNOSIS — C50119 Malignant neoplasm of central portion of unspecified female breast: Secondary | ICD-10-CM

## 2012-04-26 NOTE — Progress Notes (Signed)
  Radiation Oncology         [phone] ________________________________  Name: Linda [name] MRN: [id number]  Date: 04/09/2012  DOB: 11-02-1945  Complex simulation note  The patient has undergone complex simulation for her upcoming boost treatment for her diagnosis of left-sided breast cancer. The patient has initially been planned to receive 50.4 gray. The patient will now receive a 10 gray boost to the mastectomy scar which has been contoured. This will be accomplished using an en face electron field. Based on the depth of the target area, 9 MeV electrons will be used and this field has been normalized to the 90% isodose line. The patient's final total dose therefore will be 60.4 gray. A special port plan is requested for the boost treatment.   _______________________________  Radene Gunning, MD, PhD

## 2012-04-26 NOTE — Progress Notes (Signed)
  Radiation Oncology         [phone]) [phone] ________________________________  Name: Linda [name] MRN: [id number]  Date: 04/15/2012  DOB: [birth year]  End of Treatment Note  Diagnosis:   Bilateral breast cancer with the patient receiving postmastectomy radiotherapy on the left for invasive ductal carcinoma.     Indication for treatment:  Curative       Radiation treatment dates:    02/20/2012 through 04/15/2012  Site/dose:   The patient received a dose of 50.4 gray at 1.8 gray per fraction to the left chest wall and left supraclavicular region. This was completed using a 4 field technique. The patient then received a boost to the mastectomy scar using a 9 MeV electron field. The patient's final total dose was 60.4 gray.  Narrative: The patient tolerated radiation treatment relatively well.   The patient did experience some skin toxicity towards the end of treatment. This consisted of some desquamation. She received management involving various screens and antibiotic ointment.  Plan: The patient has completed radiation treatment. The patient will return to radiation oncology clinic for routine followup in one month. I advised the patient to call or return sooner if they have any questions or concerns related to their recovery or treatment. ________________________________  Radene Gunning, M.D., Ph.D.

## 2012-04-27 ENCOUNTER — Ambulatory Visit (HOSPITAL_BASED_OUTPATIENT_CLINIC_OR_DEPARTMENT_OTHER): Payer: Medicare Other

## 2012-04-27 ENCOUNTER — Other Ambulatory Visit (HOSPITAL_BASED_OUTPATIENT_CLINIC_OR_DEPARTMENT_OTHER): Payer: Medicare Other | Admitting: Lab

## 2012-04-27 VITALS — BP 117/72 | HR 76 | Temp 97.5°F | Resp 18

## 2012-04-27 DIAGNOSIS — C773 Secondary and unspecified malignant neoplasm of axilla and upper limb lymph nodes: Secondary | ICD-10-CM

## 2012-04-27 DIAGNOSIS — Z5112 Encounter for antineoplastic immunotherapy: Secondary | ICD-10-CM

## 2012-04-27 DIAGNOSIS — C50519 Malignant neoplasm of lower-outer quadrant of unspecified female breast: Secondary | ICD-10-CM

## 2012-04-27 DIAGNOSIS — C50119 Malignant neoplasm of central portion of unspecified female breast: Secondary | ICD-10-CM

## 2012-04-27 DIAGNOSIS — C50919 Malignant neoplasm of unspecified site of unspecified female breast: Secondary | ICD-10-CM

## 2012-04-27 DIAGNOSIS — C801 Malignant (primary) neoplasm, unspecified: Secondary | ICD-10-CM

## 2012-04-27 LAB — COMPREHENSIVE METABOLIC PANEL (CC13)
ALT: 22 U/L (ref 0–55)
AST: 19 U/L (ref 5–34)
CO2: 26 mEq/L (ref 22–29)
Calcium: 9.6 mg/dL (ref 8.4–10.4)
Chloride: 105 mEq/L (ref 98–107)
Creatinine: 0.7 mg/dL (ref 0.6–1.1)
Sodium: 138 mEq/L (ref 136–145)
Total Protein: 7 g/dL (ref 6.4–8.3)

## 2012-04-27 LAB — CBC WITH DIFFERENTIAL/PLATELET
BASO%: 0.5 % (ref 0.0–2.0)
Eosinophils Absolute: 0.1 10*3/uL (ref 0.0–0.5)
LYMPH%: 25.9 % (ref 14.0–49.7)
MCHC: 33.1 g/dL (ref 31.5–36.0)
MONO#: 0.4 10*3/uL (ref 0.1–0.9)
NEUT#: 2.3 10*3/uL (ref 1.5–6.5)
Platelets: 181 10*3/uL (ref 145–400)
RBC: 3.8 10*6/uL (ref 3.70–5.45)
RDW: 15.6 % — ABNORMAL HIGH (ref 11.2–14.5)
WBC: 3.7 10*3/uL — ABNORMAL LOW (ref 3.9–10.3)
lymph#: 1 10*3/uL (ref 0.9–3.3)
nRBC: 0 % (ref 0–0)

## 2012-04-27 MED ORDER — SODIUM CHLORIDE 0.9 % IJ SOLN
10.0000 mL | INTRAMUSCULAR | Status: DC | PRN
  Administered 2012-04-27: 10 mL
  Filled 2012-04-27: qty 10

## 2012-04-27 MED ORDER — ACETAMINOPHEN 325 MG PO TABS
650.0000 mg | ORAL_TABLET | Freq: Once | ORAL | Status: AC
  Administered 2012-04-27: 650 mg via ORAL

## 2012-04-27 MED ORDER — SODIUM CHLORIDE 0.9 % IV SOLN
Freq: Once | INTRAVENOUS | Status: AC
  Administered 2012-04-27: 12:00:00 via INTRAVENOUS

## 2012-04-27 MED ORDER — TRASTUZUMAB CHEMO INJECTION 440 MG
6.0000 mg/kg | Freq: Once | INTRAVENOUS | Status: AC
  Administered 2012-04-27: 735 mg via INTRAVENOUS
  Filled 2012-04-27: qty 35

## 2012-04-27 MED ORDER — DIPHENHYDRAMINE HCL 25 MG PO CAPS
25.0000 mg | ORAL_CAPSULE | Freq: Once | ORAL | Status: AC
  Administered 2012-04-27: 25 mg via ORAL

## 2012-04-27 MED ORDER — HEPARIN SOD (PORK) LOCK FLUSH 100 UNIT/ML IV SOLN
500.0000 [IU] | Freq: Once | INTRAVENOUS | Status: AC | PRN
  Administered 2012-04-27: 500 [IU]
  Filled 2012-04-27: qty 5

## 2012-04-27 NOTE — Patient Instructions (Signed)
[facility] Discharge Instructions for Patients Receiving Chemotherapy  Today you received the following chemotherapy agent Herceptin.  To help prevent nausea and vomiting after your treatment, we encourage you to take your nausea medication as often as prescribed for by Dr. Aaron Edelman.    If you develop nausea and vomiting that is not controlled by your nausea medication, call the clinic. If it is after clinic hours your family physician or the after hours number for the clinic or go to the Emergency Department.   BELOW ARE SYMPTOMS THAT SHOULD BE REPORTED IMMEDIATELY:  *FEVER GREATER THAN 100.5 F  *CHILLS WITH OR WITHOUT FEVER  NAUSEA AND VOMITING THAT IS NOT CONTROLLED WITH YOUR NAUSEA MEDICATION  *UNUSUAL SHORTNESS OF BREATH  *UNUSUAL BRUISING OR BLEEDING  TENDERNESS IN MOUTH AND THROAT WITH OR WITHOUT PRESENCE OF ULCERS  *URINARY PROBLEMS  *BOWEL PROBLEMS  UNUSUAL RASH Items with * indicate a potential emergency and should be followed up as soon as possible.  One of the nurses will contact you 24 hours after your treatment. Please let the nurse know about any problems that you may have experienced. Feel free to call the clinic you have any questions or concerns. The clinic phone number is [phone].   I have been informed and understand all the instructions given to me. I know to contact the clinic, my physician, or go to the Emergency Department if any problems should occur. I do not have any questions at this time, but understand that I may call the clinic during office hours   should I have any questions or need assistance in obtaining follow up care.    __________________________________________  _____________  __________ Signature of Patient or Authorized Representative            Date                   Time    __________________________________________ Nurse's Signature

## 2012-05-03 ENCOUNTER — Ambulatory Visit
Admission: RE | Admit: 2012-05-03 | Discharge: 2012-05-03 | Disposition: A | Discharge status: Home / Self Care | Payer: Medicare Other | Source: Ambulatory Visit | Attending: Oncology | Admitting: Oncology

## 2012-05-03 DIAGNOSIS — C50119 Malignant neoplasm of central portion of unspecified female breast: Secondary | ICD-10-CM

## 2012-05-11 ENCOUNTER — Ambulatory Visit (INDEPENDENT_AMBULATORY_CARE_PROVIDER_SITE_OTHER): Payer: Medicare Other | Admitting: Surgery

## 2012-05-11 ENCOUNTER — Encounter (INDEPENDENT_AMBULATORY_CARE_PROVIDER_SITE_OTHER): Payer: Self-pay | Admitting: Surgery

## 2012-05-11 ENCOUNTER — Other Ambulatory Visit (INDEPENDENT_AMBULATORY_CARE_PROVIDER_SITE_OTHER): Payer: Self-pay | Admitting: Surgery

## 2012-05-11 VITALS — BP 124/76 | HR 89 | Temp 98.0°F | Ht 68.0 in | Wt 254.0 lb

## 2012-05-11 DIAGNOSIS — Z9011 Acquired absence of right breast and nipple: Secondary | ICD-10-CM

## 2012-05-11 DIAGNOSIS — Z853 Personal history of malignant neoplasm of breast: Secondary | ICD-10-CM

## 2012-05-11 DIAGNOSIS — Z9012 Acquired absence of left breast and nipple: Secondary | ICD-10-CM

## 2012-05-11 NOTE — Progress Notes (Signed)
Subjective:     Patient ID: Linda [name], female   DOB: Oct 23, 1945, 66 y.o.   MRN: [id number]  HPI She is here for a long-term followup of her bilateral mastectomies performed back in August. She has completed radiation therapy and is now on anti-hormonal therapy.  She is doing very well and has no complaints. She will be receiving intermittent chemotherapy until March of 2014  Review of Systems     Objective:   Physical Exam On exam, her chest incisions are well healed and there are no palpable masses. There is no axillary adenopathy and no arm swelling    Assessment:     Patient stable with history of breast cancer status post bilateral mastectomies    Plan:         She is interested in bilateral reconstruction. I will now refer her to Dr. Odis Luster for his opinion from a plastic surgery standpoint. I will see her back in 6 months

## 2012-05-18 ENCOUNTER — Ambulatory Visit (HOSPITAL_BASED_OUTPATIENT_CLINIC_OR_DEPARTMENT_OTHER): Payer: Medicare Other

## 2012-05-18 ENCOUNTER — Encounter: Payer: Self-pay | Admitting: Radiation Oncology

## 2012-05-18 ENCOUNTER — Other Ambulatory Visit (HOSPITAL_BASED_OUTPATIENT_CLINIC_OR_DEPARTMENT_OTHER): Payer: Medicare Other | Admitting: Lab

## 2012-05-18 VITALS — BP 142/88 | HR 87 | Temp 97.8°F | Resp 20

## 2012-05-18 DIAGNOSIS — C801 Malignant (primary) neoplasm, unspecified: Secondary | ICD-10-CM

## 2012-05-18 DIAGNOSIS — C50519 Malignant neoplasm of lower-outer quadrant of unspecified female breast: Secondary | ICD-10-CM

## 2012-05-18 DIAGNOSIS — C50119 Malignant neoplasm of central portion of unspecified female breast: Secondary | ICD-10-CM

## 2012-05-18 DIAGNOSIS — C773 Secondary and unspecified malignant neoplasm of axilla and upper limb lymph nodes: Secondary | ICD-10-CM

## 2012-05-18 DIAGNOSIS — Z5112 Encounter for antineoplastic immunotherapy: Secondary | ICD-10-CM

## 2012-05-18 LAB — CBC WITH DIFFERENTIAL/PLATELET
BASO%: 0.7 % (ref 0.0–2.0)
Eosinophils Absolute: 0.1 10*3/uL (ref 0.0–0.5)
HCT: 36 % (ref 34.8–46.6)
HGB: 12 g/dL (ref 11.6–15.9)
LYMPH%: 24 % (ref 14.0–49.7)
MONO#: 0.5 10*3/uL (ref 0.1–0.9)
NEUT#: 3.4 10*3/uL (ref 1.5–6.5)
NEUT%: 63.3 % (ref 38.4–76.8)
Platelets: 207 10*3/uL (ref 145–400)
WBC: 5.3 10*3/uL (ref 3.9–10.3)
lymph#: 1.3 10*3/uL (ref 0.9–3.3)
nRBC: 0 % (ref 0–0)

## 2012-05-18 LAB — COMPREHENSIVE METABOLIC PANEL (CC13)
ALT: 20 U/L (ref 0–55)
AST: 18 U/L (ref 5–34)
Albumin: 3.5 g/dL (ref 3.5–5.0)
BUN: 15 mg/dL (ref 7.0–26.0)
CO2: 28 mEq/L (ref 22–29)
Calcium: 9.6 mg/dL (ref 8.4–10.4)
Chloride: 103 mEq/L (ref 98–107)
Creatinine: 0.7 mg/dL (ref 0.6–1.1)
Potassium: 3.5 mEq/L (ref 3.5–5.1)

## 2012-05-18 MED ORDER — SODIUM CHLORIDE 0.9 % IV SOLN
Freq: Once | INTRAVENOUS | Status: AC
  Administered 2012-05-18: 12:00:00 via INTRAVENOUS

## 2012-05-18 MED ORDER — ACETAMINOPHEN 325 MG PO TABS
650.0000 mg | ORAL_TABLET | Freq: Once | ORAL | Status: AC
  Administered 2012-05-18: 650 mg via ORAL

## 2012-05-18 MED ORDER — HEPARIN SOD (PORK) LOCK FLUSH 100 UNIT/ML IV SOLN
500.0000 [IU] | Freq: Once | INTRAVENOUS | Status: AC | PRN
  Administered 2012-05-18: 500 [IU]
  Filled 2012-05-18: qty 5

## 2012-05-18 MED ORDER — TRASTUZUMAB CHEMO INJECTION 440 MG
6.0000 mg/kg | Freq: Once | INTRAVENOUS | Status: AC
  Administered 2012-05-18: 735 mg via INTRAVENOUS
  Filled 2012-05-18: qty 35

## 2012-05-18 MED ORDER — SODIUM CHLORIDE 0.9 % IJ SOLN
10.0000 mL | INTRAMUSCULAR | Status: DC | PRN
  Administered 2012-05-18: 10 mL
  Filled 2012-05-18: qty 10

## 2012-05-18 MED ORDER — DIPHENHYDRAMINE HCL 25 MG PO CAPS
25.0000 mg | ORAL_CAPSULE | Freq: Once | ORAL | Status: AC
  Administered 2012-05-18: 25 mg via ORAL

## 2012-05-18 NOTE — Patient Instructions (Addendum)
[facility] Discharge Instructions for Patients Receiving Chemotherapy  Today you received the following chemotherapy agents Herceptin.  To help prevent nausea and vomiting after your treatment, we encourage you to take your nausea medication as ordered per MD.    If you develop nausea and vomiting that is not controlled by your nausea medication, call the clinic. If it is after clinic hours your family physician or the after hours number for the clinic or go to the Emergency Department.   BELOW ARE SYMPTOMS THAT SHOULD BE REPORTED IMMEDIATELY:  *FEVER GREATER THAN 100.5 F  *CHILLS WITH OR WITHOUT FEVER  NAUSEA AND VOMITING THAT IS NOT CONTROLLED WITH YOUR NAUSEA MEDICATION  *UNUSUAL SHORTNESS OF BREATH  *UNUSUAL BRUISING OR BLEEDING  TENDERNESS IN MOUTH AND THROAT WITH OR WITHOUT PRESENCE OF ULCERS  *URINARY PROBLEMS  *BOWEL PROBLEMS  UNUSUAL RASH Items with * indicate a potential emergency and should be followed up as soon as possible.  . Please let the nurse know about any problems that you may have experienced. Feel free to call the clinic you have any questions or concerns. The clinic phone number is [phone].   I have been informed and understand all the instructions given to me. I know to contact the clinic, my physician, or go to the Emergency Department if any problems should occur. I do not have any questions at this time, but understand that I may call the clinic during office hours   should I have any questions or need assistance in obtaining follow up care.    __________________________________________  _____________  __________ Signature of Patient or Authorized Representative            Date                   Time    __________________________________________ Nurse's Signature    

## 2012-05-19 ENCOUNTER — Telehealth: Payer: Self-pay | Admitting: *Deleted

## 2012-05-19 ENCOUNTER — Ambulatory Visit
Admission: RE | Admit: 2012-05-19 | Discharge: 2012-05-19 | Disposition: A | Discharge status: Home / Self Care | Payer: Medicare Other | Source: Ambulatory Visit | Attending: Radiation Oncology | Admitting: Radiation Oncology

## 2012-05-19 ENCOUNTER — Encounter: Payer: Self-pay | Admitting: Radiation Oncology

## 2012-05-19 VITALS — BP 123/68 | HR 89 | Temp 98.3°F | Resp 16 | Wt 255.4 lb

## 2012-05-19 DIAGNOSIS — C50119 Malignant neoplasm of central portion of unspecified female breast: Secondary | ICD-10-CM

## 2012-05-19 DIAGNOSIS — K219 Gastro-esophageal reflux disease without esophagitis: Secondary | ICD-10-CM | POA: Insufficient documentation

## 2012-05-19 DIAGNOSIS — J40 Bronchitis, not specified as acute or chronic: Secondary | ICD-10-CM | POA: Insufficient documentation

## 2012-05-19 DIAGNOSIS — I10 Essential (primary) hypertension: Secondary | ICD-10-CM | POA: Insufficient documentation

## 2012-05-19 DIAGNOSIS — J302 Other seasonal allergic rhinitis: Secondary | ICD-10-CM | POA: Insufficient documentation

## 2012-05-19 NOTE — Telephone Encounter (Signed)
Per staff message and POF I have scheduled appts.  JMW  

## 2012-05-19 NOTE — Progress Notes (Signed)
Patient presents to the clinic today unaccompanied for follow up appointment with Dr. Mitzi Hansen. Patient alert and oriented to person, place, and time. No distress noted. Steady gait noted. Pleasant affect noted. Patient denies pain at this time. Patient reports tanning on left/treated breast improving. Patient denies desquamation. Patient reports that she continues to apply moisturizer to her skin. Patient reports her energy level has returned to normal. Patient reports eating and sleeping without difficulty. Patient denies nausea, vomiting, headache, or dizziness. Patient scheduled to plastic surgeon on January 8th. Patient's last dose of herceptin was yesterday and the next one for January hasn't been scheduled just yet. Reported all findings to Dr. Mitzi Hansen.

## 2012-05-19 NOTE — Telephone Encounter (Signed)
Patient came in requesting to get her 06-29-2012 appointment gave patient starting at 9:15am with labs   Sent michelle email to set up patient's treatment on 06-29-2012

## 2012-05-19 NOTE — Progress Notes (Signed)
Radiation Oncology         [phone] ________________________________  Name: Linda [name] MRN: [id number]  Date: 05/19/2012  DOB: 1946/04/29  Follow-Up Visit Note  CC: Sherron Monday, MD  Sherron Monday, MD  Diagnosis:   Invasive ductal carcinoma of the left breast  Interval Since Last Radiation:  5 weeks   Narrative:  The patient returns today for routine follow-up.  The patient completed her radiotherapy on 04/15/2012 for a course of postmastectomy radiation treatment. The patient states that she is doing excellent. She is very pleased with how her skin has healed. She also indicates that her energy level is back at baseline.                              ALLERGIES:  is allergic to ace inhibitors.  Meds: Current Outpatient Prescriptions  Medication Sig Dispense Refill  . anastrozole (ARIMIDEX) 1 MG tablet Take 1 tablet (1 mg total) by mouth daily.  90 tablet  12  . b complex vitamins tablet Take 1 tablet by mouth daily.      . carvedilol (COREG) 3.125 MG tablet TAKE 1 TABLET BY MOUTH TWICE DAILY WITH A MEAL  60 tablet  3  . losartan-hydrochlorothiazide (HYZAAR) 50-12.5 MG per tablet Take 1 tablet by mouth daily.  30 tablet  3  . Multiple Vitamin (MULTIVITAMIN) capsule Take 1 capsule by mouth daily. States only daily-will stop today.      . Multiple Vitamins-Iron (MULTIVITAMINS WITH IRON) TABS Take 1 tablet by mouth daily.      . silver sulfADIAZINE (SILVADENE) 1 % cream Apply 1 application topically daily. Apply to left chest wall,supraclavicular area and under axilla bid , wash hands with soap and water before applying to skin, to remove cream before re-applying cream , do not apply 4 hours prior to rad tx      . Alum & Mag Hydroxide-Simeth (MAGIC MOUTHWASH W/LIDOCAINE) SOLN Take 5 mLs by mouth 4 (four) times daily as needed.  300 mL  2  . emollient (BIAFINE) cream Apply 1 application topically 2 (two) times daily. Apply to affected skin areas after rad tx and bedtime and prn, just  not 4 hours prior to rad txs      . hyaluronate sodium (RADIAPLEXRX) GEL Apply 1 application topically 2 (two) times daily. Not to apply to skin area 4 hours before rad tx, use after rad tx and bedtime      . HYDROcodone-acetaminophen (NORCO/VICODIN) 5-325 MG per tablet Take 1 tablet by mouth every 6 (six) hours as needed.      [name] LORazepam (ATIVAN) 0.5 MG tablet Take 0.5 mg by mouth every 8 (eight) hours.      . non-metallic deodorant Thornton Papas) MISC Apply 1 application topically daily as needed. Do not applt 4 hours before rad tx      . prochlorperazine (COMPAZINE) 10 MG tablet Take 1 tablet (10 mg total) by mouth every 6 (six) hours as needed.  30 tablet  2   No current facility-administered medications for this encounter.   Facility-Administered Medications Ordered in Other Encounters  Medication Dose Route Frequency Provider Last Rate Last Dose  . [DISCONTINUED] sodium chloride 0.9 % injection 10 mL  10 mL Intracatheter PRN Lowella Dell, MD   10 mL at 05/18/12 1253    Physical Findings: The patient is in no acute distress. Patient is alert and oriented.  weight is 255 lb 6.4 oz (115.849  kg). Her oral temperature is 98.3 F (36.8 C). Her blood pressure is 123/68 and her pulse is 89. Her respiration is 16. .   Skin looks very good with some residual hyperpigmentation. No bright erythema and no desquamation.  Lab Findings: Lab Results  Component Value Date   WBC 5.3 05/18/2012   HGB 12.0 05/18/2012   HCT 36.0 05/18/2012   MCV 91.8 05/18/2012   PLT 207 05/18/2012     Radiographic Findings: Dg Bone Density  05/03/2012  *RADIOLOGY REPORT*  Clinical Data: 66 year old postmenopausal female with history of high blood pressure and breast cancer.  DUAL X-RAY ABSORPTIOMETRY (DXA) FOR BONE MINERAL DENSITY  AP LUMBAR SPINE (L3, L4)  Bone Mineral Density (BMD):            1.001 g/cm2 Young Adult T Score:                          -0.9 Z Score:                                                1.0   LEFT FEMUR (NECK)  Bone Mineral Density (BMD):             0.898 g/cm2 Young Adult T Score:                           0.4 Z Score:                                                 2.0  ASSESSMENT:  Patient's diagnostic category is NORMAL by WHO Criteria.  FRACTURE RISK: NOT INCREASED  FRAX: Not applicable  L1 and L2 are excluded due to degenerative change.  Comparison: None.  RECOMMENDATIONS:  Effective therapies are available in the form of bisphosphonates, selective estrogen receptor modulators, biologic agents, and hormone replacement therapy (for women).  All patients should ensure an adequate intake of dietary calcium (1200mg  daily) and vitamin D (800 IU daily) unless contraindicated.  All treatment decisions require clinical judgement and consideration of individual patient factors, including patient preferences, co-morbidities, previous drug use, risk factors not captured in the FRAX model (e.g., frailty, falls, vitamin D deficiency, increased bone turnover, interval significant decline in bone density) and possible under-or over-estimation of fracture risk by FRAX.  The National Osteoporosis Foundation recommends that FDA-approved medical therapies be considered in postmenopausal women and mean age 81 or older with a:        1)     Hip or vertebral (clinical or morphometric) fracture.           2)    T-score of -2.5 or lower at the spine or hip. 3)    Ten-year fracture probability by FRAX of 3% or greater for hip fracture or 20% or greater for major osteoporotic fracture. FOLLOW-UP:  People with diagnosed cases of osteoporosis or at high risk for fracture should have regular bone mineral density tests.  For patients eligible for Medicare, routine testing is allowed once every 2 years.  The testing frequency can be increased to one year for patients who have rapidly progressing disease, those who are receiving or discontinuing  medical therapy to restore bone mass, or have additional risk factors.  World Environmental consultant [facility]) Criteria:  Normal: T scores from +1.0 to -1.0 Low Bone Mass (Osteopenia): T scores between -1.0 and -2.5 Osteoporosis: T scores -2.5 and below  Comparison to Reference Population:  T score is the key measure used in the diagnosis of osteoporosis and relative risk determination for fracture.  It provides a value for bone mass relative to the mean bone mass of a young adult reference population expressed in terms of standard deviation (SD).  Z score is the age-matched score showing the patient's values compared to a population matched for age, sex, and race.  This is also expressed in terms of standard deviation.  The patient may have values that compare favorably to the age-matched values and still be at increased risk for fracture.   Original Report Authenticated By: Cain Saupe, M.D.     Impression:    Patient is doing excellent one month out from her course of postmastectomy radiation. No ongoing issues.  Plan:  She will followup in our clinic on a when necessary basis.   Radene Gunning, M.D., Ph.D.

## 2012-06-03 ENCOUNTER — Other Ambulatory Visit (HOSPITAL_COMMUNITY): Payer: Self-pay | Admitting: Internal Medicine

## 2012-06-03 ENCOUNTER — Other Ambulatory Visit: Payer: Self-pay | Admitting: *Deleted

## 2012-06-28 ENCOUNTER — Other Ambulatory Visit: Payer: Self-pay | Admitting: *Deleted

## 2012-06-28 DIAGNOSIS — C50119 Malignant neoplasm of central portion of unspecified female breast: Secondary | ICD-10-CM

## 2012-06-29 ENCOUNTER — Telehealth: Payer: Self-pay | Admitting: *Deleted

## 2012-06-29 ENCOUNTER — Encounter: Payer: Self-pay | Admitting: Physician Assistant

## 2012-06-29 ENCOUNTER — Other Ambulatory Visit (HOSPITAL_BASED_OUTPATIENT_CLINIC_OR_DEPARTMENT_OTHER): Payer: Medicare Other | Admitting: Lab

## 2012-06-29 ENCOUNTER — Ambulatory Visit (HOSPITAL_BASED_OUTPATIENT_CLINIC_OR_DEPARTMENT_OTHER): Payer: Medicare Other

## 2012-06-29 ENCOUNTER — Ambulatory Visit (HOSPITAL_BASED_OUTPATIENT_CLINIC_OR_DEPARTMENT_OTHER): Payer: Medicare Other | Admitting: Physician Assistant

## 2012-06-29 VITALS — BP 135/81 | HR 82 | Temp 98.2°F | Resp 20 | Ht 68.0 in | Wt 255.0 lb

## 2012-06-29 DIAGNOSIS — C50919 Malignant neoplasm of unspecified site of unspecified female breast: Secondary | ICD-10-CM

## 2012-06-29 DIAGNOSIS — C50519 Malignant neoplasm of lower-outer quadrant of unspecified female breast: Secondary | ICD-10-CM

## 2012-06-29 DIAGNOSIS — Z9010 Acquired absence of unspecified breast and nipple: Secondary | ICD-10-CM

## 2012-06-29 DIAGNOSIS — C801 Malignant (primary) neoplasm, unspecified: Secondary | ICD-10-CM

## 2012-06-29 DIAGNOSIS — C50119 Malignant neoplasm of central portion of unspecified female breast: Secondary | ICD-10-CM

## 2012-06-29 DIAGNOSIS — Z5112 Encounter for antineoplastic immunotherapy: Secondary | ICD-10-CM

## 2012-06-29 DIAGNOSIS — Z170 Estrogen receptor positive status [ER+]: Secondary | ICD-10-CM

## 2012-06-29 LAB — COMPREHENSIVE METABOLIC PANEL (CC13)
ALT: 16 U/L (ref 0–55)
AST: 16 U/L (ref 5–34)
Albumin: 3.4 g/dL — ABNORMAL LOW (ref 3.5–5.0)
Alkaline Phosphatase: 79 U/L (ref 40–150)
Calcium: 9.9 mg/dL (ref 8.4–10.4)
Chloride: 104 mEq/L (ref 98–107)
Potassium: 4 mEq/L (ref 3.5–5.1)
Sodium: 141 mEq/L (ref 136–145)
Total Protein: 7.1 g/dL (ref 6.4–8.3)

## 2012-06-29 LAB — CBC WITH DIFFERENTIAL/PLATELET
BASO%: 0.4 % (ref 0.0–2.0)
EOS%: 3.3 % (ref 0.0–7.0)
HCT: 35.8 % (ref 34.8–46.6)
MCH: 31 pg (ref 25.1–34.0)
MCHC: 32.7 g/dL (ref 31.5–36.0)
MONO#: 0.4 10*3/uL (ref 0.1–0.9)
RDW: 13.9 % (ref 11.2–14.5)
WBC: 4.9 10*3/uL (ref 3.9–10.3)
lymph#: 1.2 10*3/uL (ref 0.9–3.3)
nRBC: 0 % (ref 0–0)

## 2012-06-29 MED ORDER — ACETAMINOPHEN 325 MG PO TABS
650.0000 mg | ORAL_TABLET | Freq: Once | ORAL | Status: AC
  Administered 2012-06-29: 650 mg via ORAL

## 2012-06-29 MED ORDER — HEPARIN SOD (PORK) LOCK FLUSH 100 UNIT/ML IV SOLN
500.0000 [IU] | Freq: Once | INTRAVENOUS | Status: AC | PRN
  Administered 2012-06-29: 500 [IU]
  Filled 2012-06-29: qty 5

## 2012-06-29 MED ORDER — SODIUM CHLORIDE 0.9 % IV SOLN
Freq: Once | INTRAVENOUS | Status: AC
  Administered 2012-06-29: 11:00:00 via INTRAVENOUS

## 2012-06-29 MED ORDER — DIPHENHYDRAMINE HCL 25 MG PO CAPS
25.0000 mg | ORAL_CAPSULE | Freq: Once | ORAL | Status: AC
  Administered 2012-06-29: 25 mg via ORAL

## 2012-06-29 MED ORDER — SODIUM CHLORIDE 0.9 % IV SOLN
6.0000 mg/kg | Freq: Once | INTRAVENOUS | Status: AC
  Administered 2012-06-29: 735 mg via INTRAVENOUS
  Filled 2012-06-29: qty 35

## 2012-06-29 MED ORDER — SODIUM CHLORIDE 0.9 % IJ SOLN
10.0000 mL | INTRAMUSCULAR | Status: DC | PRN
  Administered 2012-06-29: 10 mL
  Filled 2012-06-29: qty 10

## 2012-06-29 NOTE — Telephone Encounter (Signed)
Echo and Dr. Gala Romney in late Jan/early Feb; CANCEL AB on 2; ADD lab and AB with Herceptin infusion   Pt aware of all appointments

## 2012-06-29 NOTE — Progress Notes (Signed)
ID: Linda [name]   DOB: 10-Nov-1945  MR#: [id number]  ONG#:[id number]  HISTORY OF PRESENT ILLNESS: The patient noted a small amount of drainage from her left breast December of 2012. She brought it to her gynecologist's attention in January of 2013 and was set up for bilateral diagnostic mammography at the breast Center July 24, 2011. This was the patient's first ever mammogram. It showed a large irregular mass in the left retroareolar region extending to the nipple, with nipple retraction and skin thickening. This measured approximately 8.4 cm. It was associated with pleomorphic microcalcifications. By exam there was moderate distortion and retraction of the nipple with a large palpable ill-defined area in the retroareolar region. In the right right breast there was also a 2 cm hard mass palpated. Ultrasound of the right breast mass showed a complex cystic/solid area measuring 1.9 cm. Ultrasound of the right axilla was negative. Ultrasound of the left breast showed a large hypoechoic mass measuring at least 3.8 cm. The left axilla showed several adjacent abnormal appearing lymph nodes.  With this information biopsy of the right and left breast masses were obtained the same day, and showed ([id number])  (a) on the right, and invasive ductal carcinoma with papillary features, estrogen and progesterone receptor positive, HER-2 negative, with an MIB-1 of 10%.  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 The MIB-1 was 60% for the left-sided tumor.  With this information the patient was presented at the multidisciplinary breast cancer conference 08/06/2011. Subsequent evaluation and treatments are as detailed below.  INTERVAL HISTORY: Linda [name] returns today for followup of her breast cancer. Interval history is remarkable for the completion of radiation therapy in late October. She began anastrozole as to  rectum November 1, and is tolerating the drug extremely well. She's had no problems with hot flashes, no increased vaginal dryness, and no increased joint pain.  In fact, Linda [name] is feeling well. She spent 2 weeks in [place] with family over the Christmas holidays. She's getting ready to go back to work at  [employer] and begins her employee orientation this week.   REVIEW OF SYSTEMS: Linda [name] has had no recent illnesses and denies fevers, chills, or night sweats. Her energy level is good. She has had  no skin changes. She has no unusual headaches, visual changes, nausea, vomiting, photophobia, or other symptoms suggestive of central nervous system involvement. She denies any cough, shortness of breath, phlegm production, or chest pain. She's eating and drinking well. She has no change in bowel habits. She's had no unusual myalgias, arthralgias, bony pain, or peripheral edema.  A detailed review of systems is otherwise noncontributory today.  PAST MEDICAL HISTORY: Past Medical History  Diagnosis Date  . Cancer 08-13-11    07-31-11-Dx. Bilarteral Breast cancer-left greater than rt.  . Hematuria, undiagnosed cause 08-13-11    Being evaluated by [facility] 08-14-11  . Hypertension   . Bronchitis     hx  . GERD (gastroesophageal reflux disease)     doing well  . 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 Seroma 02/04/12    right breast  200cc removed  erythema on right side  . Seasonal allergies   . History of radiation therapy 02/20/12-04/15/12    left breast,total 60.4 Gy    PAST SURGICAL  HISTORY: Past Surgical History  Procedure Date  . Child birth 08-13-11    x3 -NVD  . Portacath placement 08/15/2011    Procedure: INSERTION PORT-A-CATH;  Surgeon: Shelly Rubenstein, MD;  Location: [facility];  Service: General;  Laterality: N/A;  . Mastectomy w/ sentinel node biopsy 01/21/2012    Procedure:  MASTECTOMY WITH SENTINEL LYMPH NODE BIOPSY;  Surgeon: Shelly Rubenstein, MD;  Location: [facility];  Service: General;  Laterality: Bilateral;  Left modified radical mastectomy, Rt mastectomy with Sentinel lymphnode biospy  . Breast surgery     FAMILY HISTORY Family History  Problem Relation Age of Onset  . Heart disease Mother   . Cancer Mother 62    breast, , 30 deceased  . Heart attack Father   . Cancer Sister 62    breast  The patient's father died from a heart attack at the age of 55. The patient's mother died from apparently heart problems at the age of 64. The patient had no brothers. She has 3 sisters. One of her sisters was diagnosed with breast cancer in her mid 36s. The patient does not know if his sister was ever genetically tested. The patient's mother had a mastectomy at the age of 52, presumably for breast cancer. There is no other history of breast or in cancer in the family to her knowledge.   GYNECOLOGIC HISTORY:  She does not recall when she had menarche. She had her first child at age 50. She is GX P3. She underwent menopause in her mid 40s. She never took hormone replacement.   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 She moved to [place] about 2 years ago but has a Museum/gallery curator in [place]. Son Thayer Ohm lives in [place] and works as a IT sales professional. His wife is a Engineer, civil (consulting). Son Onalee Hua lives at [place] and is a Art therapist in addition to having the [employer]. The patient attends a local [employer] here   ADVANCED DIRECTIVES: Not in place  HEALTH MAINTENANCE: History  Substance Use Topics  . Smoking status: Never Smoker   . Smokeless tobacco: Never Used  . Alcohol Use: Yes     Comment: rare- occ.     Colonoscopy: Never  PAP: Feb 2013  Bone density:  Never  Lipid panel:  Allergies  Allergen Reactions  . Ace Inhibitors Cough    Current Outpatient Prescriptions  Medication Sig Dispense Refill  . anastrozole (ARIMIDEX) 1 MG tablet Take 1 tablet (1 mg total) by mouth daily.  90 tablet  12  . b complex vitamins tablet Take 1 tablet by mouth daily.      . carvedilol (COREG) 3.125 MG tablet TAKE 1 TABLET BY MOUTH TWICE DAILY WITH A MEAL  60 tablet  3  . losartan-hydrochlorothiazide (HYZAAR) 50-12.5 MG per tablet Take 1 tablet by mouth daily.  30 tablet  3  . Multiple Vitamins-Iron (MULTIVITAMINS WITH IRON) TABS Take 1 tablet by mouth daily.        OBJECTIVE: [name] who appears well Filed Vitals:   06/29/12 0927  BP: 135/81  Pulse: 82  Temp: 98.2 F (36.8 C)  Resp: 20     Body mass index is 38.77 kg/(m^2).    ECOG FS: 0  Filed Weights   06/29/12  9562  Weight: 255 lb (115.667 kg)   Sclerae unicteric Oropharynx clear No cervical or supraclavicular adenopathy Lungs no rales or rhonchi Heart regular rate and rhythm Abdomen soft, obese, nontender, positive bowel sounds MSK no focal spinal tenderness Neuro: nonfocal, alert and oriented x 3 Breasts: Status post bilateral mastectomies. There is no evidence of recurrence. Both axillae are clear.  LAB RESULTS: Lab Results  Component Value Date   WBC 4.9 06/29/2012   NEUTROABS 3.1 06/29/2012   HGB 11.7 06/29/2012   HCT 35.8 06/29/2012   MCV 94.7 06/29/2012   PLT 180 06/29/2012      Chemistry      Component Value Date/Time   NA 141 05/18/2012 1132   NA 140 02/03/2012 1125   K 3.5 05/18/2012 1132   K 3.5 02/03/2012 1125   CL 103 05/18/2012 1132   CL 102 02/03/2012 1125   CO2 28 05/18/2012 1132   CO2 28 02/03/2012 1125   BUN 15.0 05/18/2012 1132   BUN 12 02/03/2012 1125   CREATININE 0.7 05/18/2012 1132   CREATININE 0.55 02/03/2012 1125      Component Value Date/Time   CALCIUM 9.6 05/18/2012 1132   CALCIUM 8.9 02/03/2012 1125   ALKPHOS 94 05/18/2012 1132   ALKPHOS 99  02/03/2012 1125   AST 18 05/18/2012 1132   AST 35 02/03/2012 1125   ALT 20 05/18/2012 1132   ALT 65* 02/03/2012 1125   BILITOT 0.40 05/18/2012 1132   BILITOT 0.2* 02/03/2012 1125       Lab Results  Component Value Date   LABCA2 87* 08/06/2011    STUDIES:  Echocardiogram,04/22/2012, EF = 55-60 %.   ASSESSMENT: 67 y.o.  [place] woman   (1)  status post bilateral breast biopsies 07/24/2011, showing,      (a) on the right, a clinical T2 N0 papillary/ductal breast cancer, estrogen and progesterone receptor positive, HER-2 negative, with an MIB-1 of 10%;     (b) on the left, a clinical T3 N1, stage IIIA invasive ductal carcinoma, grade 3, triple positive, with an MIB-1 of 60%.  (2)  Status post 4 dose dense cycles of doxorubicin/ cyclophosphamide followed by 4 dose dense cycles of paclitaxel and trastuzumab completed 12/09/2011  (3) the trastuzumab to be continued for a total of one year (to mid-May 2014); most recent echo 04/22/2012  (4) s/p bilateral mastectomies 01/21/2012 showing   (a) on the Right, an 8 mm invasive papillary carcinoma, grade 1, ypT1b ypN0   (b) on the Left, multiple microscopic foci of residual  Invasive ductal carcinoma with evidence of dermal lymphatic involvement, pyT1a/T4 pyN0  (5)  Postmastectomy radiation, completed 04/15/2012  (6) Started anastrazole 04/16/2012  (7) planning on reconstruction sometime 2014  PLAN: Linda [name] appears to be doing very well with regards to her breast cancer. She'll continue on anastrozole which she is tolerating well. She'll proceed for her next q. three-week dose of trastuzumab today as scheduled. She's due for her next echocardiogram in either late January or early February and will also be seeing Dr. Gala Romney for followup at the same time. I will see her back here in 6 weeks, February 24th.  She knows to call for any problems that may develop before the next visit.  Asiyah Pineau    06/29/2012

## 2012-06-29 NOTE — Patient Instructions (Signed)
[facility] Discharge Instructions for Patients Receiving Chemotherapy  Today you received the following chemotherapy agents Herceptin  To help prevent nausea and vomiting after your treatment, we encourage you to take your nausea medication as prescribed.   If you develop nausea and vomiting that is not controlled by your nausea medication, call the clinic. If it is after clinic hours your family physician or the after hours number for the clinic or go to the Emergency Department.   BELOW ARE SYMPTOMS THAT SHOULD BE REPORTED IMMEDIATELY:  *FEVER GREATER THAN 100.5 F  *CHILLS WITH OR WITHOUT FEVER  NAUSEA AND VOMITING THAT IS NOT CONTROLLED WITH YOUR NAUSEA MEDICATION  *UNUSUAL SHORTNESS OF BREATH  *UNUSUAL BRUISING OR BLEEDING  TENDERNESS IN MOUTH AND THROAT WITH OR WITHOUT PRESENCE OF ULCERS  *URINARY PROBLEMS  *BOWEL PROBLEMS  UNUSUAL RASH Items with * indicate a potential emergency and should be followed up as soon as possible.  Feel free to call the clinic you have any questions or concerns. The clinic phone number is [phone].   I have been informed and understand all the instructions given to me. I know to contact the clinic, my physician, or go to the Emergency Department if any problems should occur. I do not have any questions at this time, but understand that I may call the clinic during office hours   should I have any questions or need assistance in obtaining follow up care.   

## 2012-07-07 ENCOUNTER — Telehealth: Payer: Self-pay | Admitting: *Deleted

## 2012-07-07 ENCOUNTER — Other Ambulatory Visit: Payer: Self-pay | Admitting: *Deleted

## 2012-07-07 DIAGNOSIS — C50119 Malignant neoplasm of central portion of unspecified female breast: Secondary | ICD-10-CM

## 2012-07-07 NOTE — Telephone Encounter (Signed)
Per staff phone and POF I have scheduled appts.  JMW  

## 2012-07-19 ENCOUNTER — Ambulatory Visit (HOSPITAL_BASED_OUTPATIENT_CLINIC_OR_DEPARTMENT_OTHER)
Admission: RE | Admit: 2012-07-19 | Discharge: 2012-07-19 | Disposition: A | Discharge status: Home / Self Care | Payer: Medicare Other | Source: Ambulatory Visit | Attending: Internal Medicine | Admitting: Internal Medicine

## 2012-07-19 ENCOUNTER — Ambulatory Visit: Payer: Medicare Other | Admitting: Physician Assistant

## 2012-07-19 ENCOUNTER — Encounter (HOSPITAL_COMMUNITY): Payer: Self-pay

## 2012-07-19 ENCOUNTER — Other Ambulatory Visit: Payer: Self-pay | Admitting: Lab

## 2012-07-19 ENCOUNTER — Ambulatory Visit (HOSPITAL_COMMUNITY)
Admission: RE | Admit: 2012-07-19 | Discharge: 2012-07-19 | Disposition: A | Discharge status: Home / Self Care | Payer: Medicare Other | Source: Ambulatory Visit | Attending: Internal Medicine | Admitting: Internal Medicine

## 2012-07-19 ENCOUNTER — Ambulatory Visit: Payer: Medicare Other

## 2012-07-19 VITALS — BP 134/80 | HR 78 | Ht 68.0 in | Wt 251.1 lb

## 2012-07-19 DIAGNOSIS — C50919 Malignant neoplasm of unspecified site of unspecified female breast: Secondary | ICD-10-CM

## 2012-07-19 DIAGNOSIS — I517 Cardiomegaly: Secondary | ICD-10-CM

## 2012-07-19 DIAGNOSIS — C50119 Malignant neoplasm of central portion of unspecified female breast: Secondary | ICD-10-CM

## 2012-07-19 NOTE — Patient Instructions (Addendum)
Follow up in 3 months with an ECHO 

## 2012-07-19 NOTE — Assessment & Plan Note (Addendum)
ECHO reviewed and discussed during OV. EF and lateral S' Stable. No evidence of cardiotoxicity. Follow up in 3 months which will be her final ECHO post Herceptin.

## 2012-07-20 ENCOUNTER — Ambulatory Visit: Payer: Medicare Other

## 2012-07-21 ENCOUNTER — Other Ambulatory Visit (HOSPITAL_BASED_OUTPATIENT_CLINIC_OR_DEPARTMENT_OTHER): Payer: Medicare Other | Admitting: Lab

## 2012-07-21 ENCOUNTER — Ambulatory Visit: Payer: Medicare Other | Admitting: Physician Assistant

## 2012-07-21 ENCOUNTER — Ambulatory Visit (HOSPITAL_BASED_OUTPATIENT_CLINIC_OR_DEPARTMENT_OTHER): Payer: Medicare Other

## 2012-07-21 VITALS — BP 143/68 | HR 77 | Temp 97.5°F

## 2012-07-21 DIAGNOSIS — C773 Secondary and unspecified malignant neoplasm of axilla and upper limb lymph nodes: Secondary | ICD-10-CM

## 2012-07-21 DIAGNOSIS — C50919 Malignant neoplasm of unspecified site of unspecified female breast: Secondary | ICD-10-CM

## 2012-07-21 DIAGNOSIS — C50119 Malignant neoplasm of central portion of unspecified female breast: Secondary | ICD-10-CM

## 2012-07-21 DIAGNOSIS — Z5112 Encounter for antineoplastic immunotherapy: Secondary | ICD-10-CM

## 2012-07-21 DIAGNOSIS — C50519 Malignant neoplasm of lower-outer quadrant of unspecified female breast: Secondary | ICD-10-CM

## 2012-07-21 DIAGNOSIS — C801 Malignant (primary) neoplasm, unspecified: Secondary | ICD-10-CM

## 2012-07-21 LAB — CBC WITH DIFFERENTIAL/PLATELET
BASO%: 0.2 % (ref 0.0–2.0)
Basophils Absolute: 0 10*3/uL (ref 0.0–0.1)
EOS%: 3.3 % (ref 0.0–7.0)
HGB: 11.9 g/dL (ref 11.6–15.9)
MCH: 31.2 pg (ref 25.1–34.0)
MCHC: 32.7 g/dL (ref 31.5–36.0)
MCV: 95.3 fL (ref 79.5–101.0)
MONO%: 9.8 % (ref 0.0–14.0)
RBC: 3.82 10*6/uL (ref 3.70–5.45)
RDW: 13.6 % (ref 11.2–14.5)

## 2012-07-21 MED ORDER — SODIUM CHLORIDE 0.9 % IV SOLN
Freq: Once | INTRAVENOUS | Status: AC
  Administered 2012-07-21: 09:00:00 via INTRAVENOUS

## 2012-07-21 MED ORDER — ACETAMINOPHEN 325 MG PO TABS
650.0000 mg | ORAL_TABLET | Freq: Once | ORAL | Status: AC
  Administered 2012-07-21: 650 mg via ORAL

## 2012-07-21 MED ORDER — HEPARIN SOD (PORK) LOCK FLUSH 100 UNIT/ML IV SOLN
500.0000 [IU] | Freq: Once | INTRAVENOUS | Status: AC | PRN
  Administered 2012-07-21: 500 [IU]
  Filled 2012-07-21: qty 5

## 2012-07-21 MED ORDER — DIPHENHYDRAMINE HCL 25 MG PO CAPS: 25.0000 mg | ORAL_CAPSULE | Freq: Once | ORAL | Status: DC

## 2012-07-21 MED ORDER — TRASTUZUMAB CHEMO INJECTION 440 MG
6.0000 mg/kg | Freq: Once | INTRAVENOUS | Status: AC
  Administered 2012-07-21: 735 mg via INTRAVENOUS
  Filled 2012-07-21: qty 35

## 2012-07-21 MED ORDER — SODIUM CHLORIDE 0.9 % IJ SOLN
10.0000 mL | INTRAMUSCULAR | Status: DC | PRN
  Administered 2012-07-21: 10 mL
  Filled 2012-07-21: qty 10

## 2012-07-21 NOTE — Patient Instructions (Signed)
[facility] Discharge Instructions for Patients Receiving Chemotherapy  Today you received the following chemotherapy agents Herceptin To help prevent nausea and vomiting after your treatment, we encourage you to take your nausea medication as prescribed.  If you develop nausea and vomiting that is not controlled by your nausea medication, call the clinic. If it is after clinic hours your family physician or the after hours number for the clinic or go to the Emergency Department.   BELOW ARE SYMPTOMS THAT SHOULD BE REPORTED IMMEDIATELY:  *FEVER GREATER THAN 100.5 F  *CHILLS WITH OR WITHOUT FEVER  NAUSEA AND VOMITING THAT IS NOT CONTROLLED WITH YOUR NAUSEA MEDICATION  *UNUSUAL SHORTNESS OF BREATH  *UNUSUAL BRUISING OR BLEEDING  TENDERNESS IN MOUTH AND THROAT WITH OR WITHOUT PRESENCE OF ULCERS  *URINARY PROBLEMS  *BOWEL PROBLEMS  UNUSUAL RASH Items with * indicate a potential emergency and should be followed up as soon as possible.  One of the nurses will contact you 24 hours after your treatment. Please let the nurse know about any problems that you may have experienced. Feel free to call the clinic you have any questions or concerns. The clinic phone number is [phone].   I have been informed and understand all the instructions given to me. I know to contact the clinic, my physician, or go to the Emergency Department if any problems should occur. I do not have any questions at this time, but understand that I may call the clinic during office hours   should I have any questions or need assistance in obtaining follow up care.    __________________________________________  _____________  __________ Signature of Patient or Authorized Representative            Date                   Time    __________________________________________ Nurse's Signature    

## 2012-07-26 ENCOUNTER — Ambulatory Visit: Payer: Medicare Other | Admitting: Physician Assistant

## 2012-08-09 ENCOUNTER — Encounter: Payer: Self-pay | Admitting: Physician Assistant

## 2012-08-09 ENCOUNTER — Ambulatory Visit (HOSPITAL_BASED_OUTPATIENT_CLINIC_OR_DEPARTMENT_OTHER): Payer: Medicare Other | Admitting: Physician Assistant

## 2012-08-09 ENCOUNTER — Telehealth: Payer: Self-pay | Admitting: Oncology

## 2012-08-09 ENCOUNTER — Ambulatory Visit (HOSPITAL_BASED_OUTPATIENT_CLINIC_OR_DEPARTMENT_OTHER): Payer: Medicare Other

## 2012-08-09 ENCOUNTER — Other Ambulatory Visit (HOSPITAL_BASED_OUTPATIENT_CLINIC_OR_DEPARTMENT_OTHER): Payer: Medicare Other | Admitting: Lab

## 2012-08-09 VITALS — BP 138/81 | HR 75 | Temp 98.3°F | Resp 18 | Ht 68.0 in | Wt 254.1 lb

## 2012-08-09 DIAGNOSIS — C50119 Malignant neoplasm of central portion of unspecified female breast: Secondary | ICD-10-CM

## 2012-08-09 DIAGNOSIS — C801 Malignant (primary) neoplasm, unspecified: Secondary | ICD-10-CM

## 2012-08-09 DIAGNOSIS — C50919 Malignant neoplasm of unspecified site of unspecified female breast: Secondary | ICD-10-CM

## 2012-08-09 LAB — CBC WITH DIFFERENTIAL/PLATELET
BASO%: 0.4 % (ref 0.0–2.0)
LYMPH%: 25.3 % (ref 14.0–49.7)
MCHC: 32.9 g/dL (ref 31.5–36.0)
MCV: 94.7 fL (ref 79.5–101.0)
MONO#: 0.5 10*3/uL (ref 0.1–0.9)
MONO%: 9.4 % (ref 0.0–14.0)
Platelets: 184 10*3/uL (ref 145–400)
RBC: 3.76 10*6/uL (ref 3.70–5.45)
RDW: 13.4 % (ref 11.2–14.5)
WBC: 5 10*3/uL (ref 3.9–10.3)
nRBC: 0 % (ref 0–0)

## 2012-08-09 MED ORDER — ACETAMINOPHEN 325 MG PO TABS
650.0000 mg | ORAL_TABLET | Freq: Once | ORAL | Status: AC
  Administered 2012-08-09: 650 mg via ORAL

## 2012-08-09 MED ORDER — SODIUM CHLORIDE 0.9 % IJ SOLN
10.0000 mL | INTRAMUSCULAR | Status: DC | PRN
Start: 2012-08-09 — End: 2012-08-09
  Administered 2012-08-09: 10 mL
  Filled 2012-08-09: qty 10

## 2012-08-09 MED ORDER — HEPARIN SOD (PORK) LOCK FLUSH 100 UNIT/ML IV SOLN
500.0000 [IU] | Freq: Once | INTRAVENOUS | Status: AC | PRN
  Administered 2012-08-09: 500 [IU]
  Filled 2012-08-09: qty 5

## 2012-08-09 MED ORDER — TRASTUZUMAB CHEMO INJECTION 440 MG
6.0000 mg/kg | Freq: Once | INTRAVENOUS | Status: AC
  Administered 2012-08-09: 735 mg via INTRAVENOUS
  Filled 2012-08-09: qty 35

## 2012-08-09 MED ORDER — SODIUM CHLORIDE 0.9 % IV SOLN
Freq: Once | INTRAVENOUS | Status: AC
  Administered 2012-08-09: 10:00:00 via INTRAVENOUS

## 2012-08-09 NOTE — Patient Instructions (Addendum)
[facility] Discharge Instructions for Patients Receiving Chemotherapy  Today you received the following chemotherapy agents Herceptin.  To help prevent nausea and vomiting after your treatment, we encourage you to take your nausea medication as prescribed.   If you develop nausea and vomiting that is not controlled by your nausea medication, call the clinic. If it is after clinic hours your family physician or the after hours number for the clinic or go to the Emergency Department.   BELOW ARE SYMPTOMS THAT SHOULD BE REPORTED IMMEDIATELY:  *FEVER GREATER THAN 100.5 F  *CHILLS WITH OR WITHOUT FEVER  NAUSEA AND VOMITING THAT IS NOT CONTROLLED WITH YOUR NAUSEA MEDICATION  *UNUSUAL SHORTNESS OF BREATH  *UNUSUAL BRUISING OR BLEEDING  TENDERNESS IN MOUTH AND THROAT WITH OR WITHOUT PRESENCE OF ULCERS  *URINARY PROBLEMS  *BOWEL PROBLEMS  UNUSUAL RASH Items with * indicate a potential emergency and should be followed up as soon as possible.  Feel free to call the clinic you have any questions or concerns. The clinic phone number is [phone].   I have been informed and understand all the instructions given to me. I know to contact the clinic, my physician, or go to the Emergency Department if any problems should occur. I do not have any questions at this time, but understand that I may call the clinic during office hours   should I have any questions or need assistance in obtaining follow up care.    __________________________________________  _____________  __________ Signature of Patient or Authorized Representative            Date                   Time    __________________________________________ Nurse's Signature    

## 2012-08-09 NOTE — Progress Notes (Signed)
ID: Linda [name]   DOB: Oct 28, 1945  MR#: [id number]  JYN#:[id number]  HISTORY OF PRESENT ILLNESS: The patient noted a small amount of drainage from her left breast December of 2012. She brought it to her gynecologist's attention in January of 2013 and was set up for bilateral diagnostic mammography at the [facility] Center July 24, 2011. This was the patient's first ever mammogram. It showed a large irregular mass in the left retroareolar region extending to the nipple, with nipple retraction and skin thickening. This measured approximately 8.4 cm. It was associated with pleomorphic microcalcifications. By exam there was moderate distortion and retraction of the nipple with a large palpable ill-defined area in the retroareolar region. In the right right breast there was also a 2 cm hard mass palpated. Ultrasound of the right breast mass showed a complex cystic/solid area measuring 1.9 cm. Ultrasound of the right axilla was negative. Ultrasound of the left breast showed a large hypoechoic mass measuring at least 3.8 cm. The left axilla showed several adjacent abnormal appearing lymph nodes.  With this information biopsy of the right and left breast masses were obtained the same day, and showed ([id number])  (a) on the right, and invasive ductal carcinoma with papillary features, estrogen and progesterone receptor positive, HER-2 negative, with an MIB-1 of 10%.  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 The MIB-1 was 60% for the left-sided tumor.  With this information the patient was presented at the multidisciplinary breast cancer conference 08/06/2011. Subsequent evaluation and treatments are as detailed below.  INTERVAL HISTORY: Linda [name] returns today for followup of her right and left breast cancers. She continues to receive trastuzumab on a Q. three-week basis with good tolerance. She is also on  anastrozole which she is tolerating very well. She's had no significant hot flashes, denies any increased joint pain, and has had no vaginal dryness.   REVIEW OF SYSTEMS: Linda [name] is doing very well overall. She has had no recent illnesses and denies fevers, chills, or night sweats. Her energy level is good. She has had  no skin changes or rashes, and denies any signs of abnormal bruising or bleeding.. She has no unusual headaches, visual changes, nausea, vomiting, photophobia, or other symptoms suggestive of central nervous system involvement. She denies any cough, shortness of breath, phlegm production, or chest pain. She's eating and drinking well. She has no change in bowel habits. She's had no unusual myalgias, arthralgias, bony pain, or peripheral edema.  A detailed review of systems is otherwise noncontributory today.  PAST MEDICAL HISTORY: Past Medical History  Diagnosis Date  . Cancer 08-13-11    07-31-11-Dx. Bilarteral Breast cancer-left greater than rt.  . Hematuria, undiagnosed cause 08-13-11    Being evaluated by [facility] 08-14-11  . Hypertension   . Bronchitis     hx  . GERD (gastroesophageal reflux disease)     doing well  . 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 Seroma 02/04/12    right breast  200cc removed  erythema on right side  . Seasonal allergies   . History of radiation therapy 02/20/12-04/15/12    left breast,total 60.4 Gy    PAST SURGICAL HISTORY: Past Surgical History  Procedure Laterality Date  . Child birth  08-13-11    x3 -NVD  .  Portacath placement  08/15/2011    Procedure: INSERTION PORT-A-CATH;  Surgeon: Shelly Rubenstein, MD;  Location: [facility];  Service: General;  Laterality: N/A;  . Mastectomy w/ sentinel node biopsy  01/21/2012    Procedure: MASTECTOMY WITH SENTINEL LYMPH NODE BIOPSY;  Surgeon: Shelly Rubenstein, MD;  Location: [facility];  Service: General;  Laterality: Bilateral;  Left modified radical mastectomy, Rt mastectomy with Sentinel lymphnode biospy  . Breast surgery      FAMILY HISTORY Family History  Problem Relation Age of Onset  . Heart disease Mother   . Cancer Mother 25    breast, , 74 deceased  . Heart attack Father   . Cancer Sister 65    breast  The patient's father died from a heart attack at the age of 38. The patient's mother died from apparently heart problems at the age of 40. The patient had no brothers. She has 3 sisters. One of her sisters was diagnosed with breast cancer in her mid 59s. The patient does not know if his sister was ever genetically tested. The patient's mother had a mastectomy at the age of 69, presumably for breast cancer. There is no other history of breast or in cancer in the family to her knowledge.   GYNECOLOGIC HISTORY:  She does not recall when she had menarche. She had her first child at age 19. She is GX P3. She underwent menopause in her mid 49s. She never took hormone replacement.   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 She moved to [place] about 2 years ago but has a Museum/gallery curator in [place]. Son Thayer Ohm lives in [place] and works as a IT sales professional. His wife is a Engineer, civil (consulting). Son Onalee Hua lives at [place] and is a Art therapist in addition to having the [employer]. The patient attends a local [employer] here   ADVANCED DIRECTIVES: Not in place  HEALTH MAINTENANCE: History  Substance Use Topics  . Smoking status: Never Smoker   . Smokeless tobacco: Never Used  . Alcohol Use: Yes     Comment: rare- occ.     Colonoscopy: Never  PAP: Feb 2013  Bone density: Never  Lipid panel:  Allergies  Allergen Reactions  . Ace Inhibitors Cough    Current Outpatient  Prescriptions  Medication Sig Dispense Refill  . anastrozole (ARIMIDEX) 1 MG tablet Take 1 tablet (1 mg total) by mouth daily.  90 tablet  12  . b complex vitamins tablet Take 1 tablet by mouth daily.      . carvedilol (COREG) 3.125 MG tablet TAKE 1 TABLET BY MOUTH TWICE DAILY WITH A MEAL  60 tablet  3  . losartan-hydrochlorothiazide (HYZAAR) 50-12.5 MG per tablet Take 1 tablet by mouth daily.  30 tablet  3  . Multiple Vitamins-Iron (MULTIVITAMINS WITH IRON) TABS Take 1 tablet by mouth daily.       No current facility-administered medications for this visit.    OBJECTIVE: [name] who appears well Filed Vitals:   08/09/12 0856  BP: 138/81  Pulse: 75  Temp: 98.3 F (36.8 C)  Resp: 18     Body mass index is 38.64 kg/(m^2).    ECOG FS: 0  Filed Weights   08/09/12 0856  Weight: 254 lb 1 oz (  115.242 kg)   Sclerae unicteric Oropharynx clear No cervical or supraclavicular adenopathy Lungs no rales or rhonchi Heart regular rate and rhythm Abdomen soft, obese, nontender, positive bowel sounds MSK no focal spinal tenderness Neuro: nonfocal, well oriented with positive affect Breasts: Status post bilateral mastectomies. There is no evidence of recurrence. Both axillae are clear.  LAB RESULTS: Lab Results  Component Value Date   WBC 5.0 08/09/2012   NEUTROABS 3.1 08/09/2012   HGB 11.7 08/09/2012   HCT 35.6 08/09/2012   MCV 94.7 08/09/2012   PLT 184 08/09/2012      Chemistry      Component Value Date/Time   NA 141 06/29/2012 0902   NA 140 02/03/2012 1125   K 4.0 06/29/2012 0902   K 3.5 02/03/2012 1125   CL 104 06/29/2012 0902   CL 102 02/03/2012 1125   CO2 28 06/29/2012 0902   CO2 28 02/03/2012 1125   BUN 15.0 06/29/2012 0902   BUN 12 02/03/2012 1125   CREATININE 0.7 06/29/2012 0902   CREATININE 0.55 02/03/2012 1125      Component Value Date/Time   CALCIUM 9.9 06/29/2012 0902   CALCIUM 8.9 02/03/2012 1125   ALKPHOS 79 06/29/2012 0902   ALKPHOS 99 02/03/2012 1125   AST 16  06/29/2012 0902   AST 35 02/03/2012 1125   ALT 16 06/29/2012 0902   ALT 65* 02/03/2012 1125   BILITOT 0.42 06/29/2012 0902   BILITOT 0.2* 02/03/2012 1125       Lab Results  Component Value Date   LABCA2 87* 08/06/2011    STUDIES:  Echocardiogram, 07/19/2012, EF = 60 - 65 %.  ASSESSMENT: 67 y.o.  [place] woman   (1)  status post bilateral breast biopsies 07/24/2011, showing,      (a) on the right, a clinical T2 N0 papillary/ductal breast cancer, estrogen and progesterone receptor positive, HER-2 negative, with an MIB-1 of 10%;     (b) on the left, a clinical T3 N1, stage IIIA invasive ductal carcinoma, grade 3, triple positive, with an MIB-1 of 60%.  (2)  Status post 4 dose dense cycles of doxorubicin/ cyclophosphamide followed by 4 dose dense cycles of paclitaxel and trastuzumab completed 12/09/2011  (3) the trastuzumab to be continued for a total of one year (to mid-May 2014); most recent echo 07/19/2012  (4) s/p bilateral mastectomies 01/21/2012 showing   (a) on the Right, an 8 mm invasive papillary carcinoma, grade 1, ypT1b ypN0   (b) on the Left, multiple microscopic foci of residual  Invasive ductal carcinoma with evidence of dermal lymphatic involvement, pyT1a/T4 pyN0  (5)  Postmastectomy radiation, completed 04/15/2012  (6) Started anastrazole 04/16/2012   PLAN: Linda [name] continues to tolerate the anastrozole and the trastuzumab well. She'll receive her next dose of trastuzumab today, and will continue on a every 3 week basis through May. She'll see Dr. Darnelle Catalan when she returns on May 19 with the completion of trastuzumab to review her long-term followup plan. She'll have a final echocardiogram and see Dr. Gala Romney  soon thereafter.    Pat voices understanding and agreement with this plan. She knows to call for any problems that may develop before the next visit.  Cynethia Schindler    08/09/2012

## 2012-08-09 NOTE — Telephone Encounter (Signed)
gv pt appt schedule for March thru May. S/w Shantell @ Dr. Bynum Bellows office and pt is on recall for appt after 5/19. Dr. Prescott Gum office will contact pt. Pt aware.

## 2012-08-12 ENCOUNTER — Telehealth: Payer: Self-pay | Admitting: *Deleted

## 2012-08-12 ENCOUNTER — Other Ambulatory Visit (HOSPITAL_COMMUNITY): Payer: Self-pay | Admitting: Adult Health

## 2012-08-12 NOTE — Telephone Encounter (Signed)
Patient called and left message to call her regarding appts. I have called her back and moved appts.  JMW

## 2012-08-14 NOTE — Progress Notes (Signed)
Patient ID: Linda [name], female   DOB: Nov 09, 1945, 67 y.o.   MRN: [id number] Oncologist: Dr Darnelle Catalan  HPI:  Linda [name] is a 67 year old [place] woman status post bilateral breast biopsies 07/24/2011, showing, on the right, a clinical T2 N0 papillary/ductal breast cancer, estrogen and progesterone receptor positive, HER-2 negative, with an MIB-1 of 10%; on the left, a T3 N1 invasive ductal carcinoma, grade 3, triple positive, with an MIB-1 of 60%. She received  cyclophosphamide and doxorubicin  in dose dense fashion x4 which has been followed by paclitaxel and trastuzumab again every 2 weeks x4, with the trastuzumab continue to complete a year.   Echos: 08/20/11 EF 60-65% Lateral S' 8.5 mod LVH  12/04/11 EF 60-65% lateral s' 7.9  Mild LVH (poor window) 04/22/12 EF 55-60% lateral s' 7.0 (poor window) 07/19/12 EF 55-60% Lateral S' 7.8 poor window  She returns for follow up. Denies SOB/PND/Orthopnea. Tolerating treatment well. Works full time for [employer].  She will finish Herceptin in March 2014.   Review of Systems: All pertinent positives and negatives as in HPI, otherwise negative.   Past Medical History  Diagnosis Date  . Cancer 08-13-11    07-31-11-Dx. Bilarteral Breast cancer-left greater than rt.  . Hematuria, undiagnosed cause 08-13-11    Being evaluated by [facility] 08-14-11  . Hypertension   . Bronchitis     hx  . GERD (gastroesophageal reflux disease)     doing well  . 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 Seroma 02/04/12    right breast  200cc removed  erythema on right side  . Seasonal allergies   . History of radiation therapy 02/20/12-04/15/12    left breast,total 60.4 Gy    Current Outpatient Prescriptions  Medication Sig Dispense Refill  . anastrozole (ARIMIDEX) 1 MG tablet Take 1 tablet (1 mg total) by mouth daily.  90 tablet  12  . b complex vitamins  tablet Take 1 tablet by mouth daily.      [name] losartan-hydrochlorothiazide (HYZAAR) 50-12.5 MG per tablet Take 1 tablet by mouth daily.  30 tablet  3  . Multiple Vitamins-Iron (MULTIVITAMINS WITH IRON) TABS Take 1 tablet by mouth daily.      . carvedilol (COREG) 3.125 MG tablet TAKE 1 TABLET BY MOUTH TWICE DAILY WITH A MEAL  60 tablet  3   No current facility-administered medications for this encounter.     Allergies  Allergen Reactions  . Ace Inhibitors Cough     PHYSICAL EXAM: Filed Vitals:   07/19/12 1121  BP: 134/80  Pulse: 78  Height: 5\' 8"  (1.727 m)  Weight: 251 lb 1.9 oz (113.907 kg)  SpO2: 96%   General:  Obese. Well appearing. No respiratory difficulty HEENT: normal Neck: supple. JVD difficult to assess - does not appear elevated. Carotids 2+ bilat; no bruits. No lymphadenopathy or thryomegaly appreciated. Cor: PMI nondisplaced. Regular rate & rhythm. No rubs, gallops or murmurs. Lungs: clear Abdomen: soft, obese nontender, nondistended.No bruits or masses. Good bowel sounds. Extremities: no cyanosis, clubbing, rash, edema Neuro: alert & oriented x 3, cranial nerves grossly intact. moves all 4 extremities w/o difficulty. Affect pleasant.  ASSESSMENT & PLAN:

## 2012-08-30 ENCOUNTER — Ambulatory Visit: Payer: PRIVATE HEALTH INSURANCE

## 2012-09-02 ENCOUNTER — Ambulatory Visit (HOSPITAL_BASED_OUTPATIENT_CLINIC_OR_DEPARTMENT_OTHER): Payer: Medicare Other

## 2012-09-02 VITALS — BP 117/94 | HR 76 | Temp 97.9°F | Resp 20 | Wt 254.0 lb

## 2012-09-02 MED ORDER — ACETAMINOPHEN 325 MG PO TABS
650.0000 mg | ORAL_TABLET | Freq: Once | ORAL | Status: AC
  Administered 2012-09-02: 650 mg via ORAL

## 2012-09-02 MED ORDER — SODIUM CHLORIDE 0.9 % IV SOLN
Freq: Once | INTRAVENOUS | Status: AC
  Administered 2012-09-02: 11:00:00 via INTRAVENOUS

## 2012-09-02 MED ORDER — HEPARIN SOD (PORK) LOCK FLUSH 100 UNIT/ML IV SOLN
500.0000 [IU] | Freq: Once | INTRAVENOUS | Status: AC
  Administered 2012-09-02: 500 [IU] via INTRAVENOUS
  Filled 2012-09-02: qty 5

## 2012-09-02 MED ORDER — TRASTUZUMAB CHEMO INJECTION 440 MG
6.0000 mg/kg | Freq: Once | INTRAVENOUS | Status: AC
  Administered 2012-09-02: 693 mg via INTRAVENOUS
  Filled 2012-09-02: qty 33

## 2012-09-02 MED ORDER — SODIUM CHLORIDE 0.9 % IJ SOLN
10.0000 mL | INTRAMUSCULAR | Status: DC | PRN
  Administered 2012-09-02: 10 mL via INTRAVENOUS
  Filled 2012-09-02: qty 10

## 2012-09-02 NOTE — Progress Notes (Signed)
Pt did not want to take benadryl. She is driving and going to work after her treatment. She has not had any reactions to herceptin.

## 2012-09-02 NOTE — Patient Instructions (Addendum)

## 2012-09-08 ENCOUNTER — Encounter (INDEPENDENT_AMBULATORY_CARE_PROVIDER_SITE_OTHER): Payer: Self-pay | Admitting: Surgery

## 2012-09-20 ENCOUNTER — Ambulatory Visit (HOSPITAL_BASED_OUTPATIENT_CLINIC_OR_DEPARTMENT_OTHER): Payer: Medicare Other

## 2012-09-20 ENCOUNTER — Other Ambulatory Visit: Payer: Self-pay | Admitting: Oncology

## 2012-09-20 VITALS — BP 135/81 | HR 69 | Temp 97.6°F | Resp 20

## 2012-09-20 DIAGNOSIS — C801 Malignant (primary) neoplasm, unspecified: Secondary | ICD-10-CM

## 2012-09-20 DIAGNOSIS — C50919 Malignant neoplasm of unspecified site of unspecified female breast: Secondary | ICD-10-CM

## 2012-09-20 DIAGNOSIS — C50119 Malignant neoplasm of central portion of unspecified female breast: Secondary | ICD-10-CM

## 2012-09-20 DIAGNOSIS — C50519 Malignant neoplasm of lower-outer quadrant of unspecified female breast: Secondary | ICD-10-CM

## 2012-09-20 DIAGNOSIS — Z5112 Encounter for antineoplastic immunotherapy: Secondary | ICD-10-CM

## 2012-09-20 MED ORDER — TRASTUZUMAB CHEMO INJECTION 440 MG
6.0000 mg/kg | Freq: Once | INTRAVENOUS | Status: AC
  Administered 2012-09-20: 693 mg via INTRAVENOUS
  Filled 2012-09-20: qty 33

## 2012-09-20 MED ORDER — ACETAMINOPHEN 325 MG PO TABS
650.0000 mg | ORAL_TABLET | Freq: Once | ORAL | Status: AC
  Administered 2012-09-20: 650 mg via ORAL

## 2012-09-20 MED ORDER — SODIUM CHLORIDE 0.9 % IV SOLN
Freq: Once | INTRAVENOUS | Status: AC
  Administered 2012-09-20: 10:00:00 via INTRAVENOUS

## 2012-09-20 MED ORDER — SODIUM CHLORIDE 0.9 % IJ SOLN
10.0000 mL | INTRAMUSCULAR | Status: DC | PRN
  Administered 2012-09-20: 10 mL
  Filled 2012-09-20: qty 10

## 2012-09-20 MED ORDER — HEPARIN SOD (PORK) LOCK FLUSH 100 UNIT/ML IV SOLN
500.0000 [IU] | Freq: Once | INTRAVENOUS | Status: AC | PRN
  Administered 2012-09-20: 500 [IU]
  Filled 2012-09-20: qty 5

## 2012-09-20 NOTE — Patient Instructions (Signed)
[facility] Discharge Instructions for Patients Receiving Chemotherapy  Today you received the following chemotherapy agents: herceptin  To help prevent nausea and vomiting after your treatment, we encourage you to take your nausea medication.  Take it as often as prescribed.     If you develop nausea and vomiting that is not controlled by your nausea medication, call the clinic. If it is after clinic hours your family physician or the after hours number for the clinic or go to the Emergency Department.   BELOW ARE SYMPTOMS THAT SHOULD BE REPORTED IMMEDIATELY:  *FEVER GREATER THAN 100.5 F  *CHILLS WITH OR WITHOUT FEVER  NAUSEA AND VOMITING THAT IS NOT CONTROLLED WITH YOUR NAUSEA MEDICATION  *UNUSUAL SHORTNESS OF BREATH  *UNUSUAL BRUISING OR BLEEDING  TENDERNESS IN MOUTH AND THROAT WITH OR WITHOUT PRESENCE OF ULCERS  *URINARY PROBLEMS  *BOWEL PROBLEMS  UNUSUAL RASH Items with * indicate a potential emergency and should be followed up as soon as possible.  Feel free to call the clinic you have any questions or concerns. The clinic phone number is [phone].   I have been informed and understand all the instructions given to me. I know to contact the clinic, my physician, or go to the Emergency Department if any problems should occur. I do not have any questions at this time, but understand that I may call the clinic during office hours   should I have any questions or need assistance in obtaining follow up care.    __________________________________________  _____________  __________ Signature of Patient or Authorized Representative            Date                   Time    __________________________________________ Nurse's Signature    

## 2012-10-11 ENCOUNTER — Ambulatory Visit (HOSPITAL_BASED_OUTPATIENT_CLINIC_OR_DEPARTMENT_OTHER): Payer: Medicare Other

## 2012-10-11 VITALS — BP 143/68 | HR 75 | Temp 97.9°F | Resp 20

## 2012-10-11 DIAGNOSIS — C50119 Malignant neoplasm of central portion of unspecified female breast: Secondary | ICD-10-CM

## 2012-10-11 DIAGNOSIS — C801 Malignant (primary) neoplasm, unspecified: Secondary | ICD-10-CM

## 2012-10-11 DIAGNOSIS — C50919 Malignant neoplasm of unspecified site of unspecified female breast: Secondary | ICD-10-CM

## 2012-10-11 DIAGNOSIS — C50519 Malignant neoplasm of lower-outer quadrant of unspecified female breast: Secondary | ICD-10-CM

## 2012-10-11 DIAGNOSIS — Z5112 Encounter for antineoplastic immunotherapy: Secondary | ICD-10-CM

## 2012-10-11 MED ORDER — HEPARIN SOD (PORK) LOCK FLUSH 100 UNIT/ML IV SOLN
500.0000 [IU] | Freq: Once | INTRAVENOUS | Status: AC | PRN
  Administered 2012-10-11: 500 [IU]
  Filled 2012-10-11: qty 5

## 2012-10-11 MED ORDER — TRASTUZUMAB CHEMO INJECTION 440 MG
6.0000 mg/kg | Freq: Once | INTRAVENOUS | Status: AC
  Administered 2012-10-11: 693 mg via INTRAVENOUS
  Filled 2012-10-11: qty 33

## 2012-10-11 MED ORDER — DIPHENHYDRAMINE HCL 25 MG PO CAPS
25.0000 mg | ORAL_CAPSULE | Freq: Once | ORAL | Status: AC
  Administered 2012-10-11: 25 mg via ORAL

## 2012-10-11 MED ORDER — SODIUM CHLORIDE 0.9 % IJ SOLN
10.0000 mL | INTRAMUSCULAR | Status: DC | PRN
  Administered 2012-10-11: 10 mL
  Filled 2012-10-11: qty 10

## 2012-10-11 MED ORDER — ACETAMINOPHEN 325 MG PO TABS
650.0000 mg | ORAL_TABLET | Freq: Once | ORAL | Status: AC
  Administered 2012-10-11: 650 mg via ORAL

## 2012-10-11 MED ORDER — SODIUM CHLORIDE 0.9 % IV SOLN
Freq: Once | INTRAVENOUS | Status: AC
  Administered 2012-10-11: 10:00:00 via INTRAVENOUS

## 2012-10-11 NOTE — Patient Instructions (Signed)
[facility] Discharge Instructions for Patients Receiving Chemotherapy  Today you received the following chemotherapy agents: herceptin  To help prevent nausea and vomiting after your treatment, we encourage you to take your nausea medication.  Take it as often as prescribed.     If you develop nausea and vomiting that is not controlled by your nausea medication, call the clinic. If it is after clinic hours your family physician or the after hours number for the clinic or go to the Emergency Department.   BELOW ARE SYMPTOMS THAT SHOULD BE REPORTED IMMEDIATELY:  *FEVER GREATER THAN 100.5 F  *CHILLS WITH OR WITHOUT FEVER  NAUSEA AND VOMITING THAT IS NOT CONTROLLED WITH YOUR NAUSEA MEDICATION  *UNUSUAL SHORTNESS OF BREATH  *UNUSUAL BRUISING OR BLEEDING  TENDERNESS IN MOUTH AND THROAT WITH OR WITHOUT PRESENCE OF ULCERS  *URINARY PROBLEMS  *BOWEL PROBLEMS  UNUSUAL RASH Items with * indicate a potential emergency and should be followed up as soon as possible.  Feel free to call the clinic you have any questions or concerns. The clinic phone number is [phone].   I have been informed and understand all the instructions given to me. I know to contact the clinic, my physician, or go to the Emergency Department if any problems should occur. I do not have any questions at this time, but understand that I may call the clinic during office hours   should I have any questions or need assistance in obtaining follow up care.    __________________________________________  _____________  __________ Signature of Patient or Authorized Representative            Date                   Time    __________________________________________ Nurse's Signature    

## 2012-11-01 ENCOUNTER — Ambulatory Visit (HOSPITAL_BASED_OUTPATIENT_CLINIC_OR_DEPARTMENT_OTHER): Payer: Medicare Other | Admitting: Oncology

## 2012-11-01 ENCOUNTER — Ambulatory Visit (HOSPITAL_BASED_OUTPATIENT_CLINIC_OR_DEPARTMENT_OTHER): Payer: Medicare Other

## 2012-11-01 ENCOUNTER — Other Ambulatory Visit (HOSPITAL_BASED_OUTPATIENT_CLINIC_OR_DEPARTMENT_OTHER): Payer: Medicare Other | Admitting: Lab

## 2012-11-01 ENCOUNTER — Telehealth: Payer: Self-pay | Admitting: *Deleted

## 2012-11-01 VITALS — BP 126/76 | HR 73 | Temp 98.3°F | Resp 20 | Ht 68.0 in | Wt 250.5 lb

## 2012-11-01 DIAGNOSIS — C50911 Malignant neoplasm of unspecified site of right female breast: Secondary | ICD-10-CM

## 2012-11-01 DIAGNOSIS — C50119 Malignant neoplasm of central portion of unspecified female breast: Secondary | ICD-10-CM

## 2012-11-01 DIAGNOSIS — C50919 Malignant neoplasm of unspecified site of unspecified female breast: Secondary | ICD-10-CM

## 2012-11-01 DIAGNOSIS — C801 Malignant (primary) neoplasm, unspecified: Secondary | ICD-10-CM

## 2012-11-01 DIAGNOSIS — C50519 Malignant neoplasm of lower-outer quadrant of unspecified female breast: Secondary | ICD-10-CM

## 2012-11-01 DIAGNOSIS — Z5112 Encounter for antineoplastic immunotherapy: Secondary | ICD-10-CM

## 2012-11-01 DIAGNOSIS — Z170 Estrogen receptor positive status [ER+]: Secondary | ICD-10-CM

## 2012-11-01 DIAGNOSIS — C50112 Malignant neoplasm of central portion of left female breast: Secondary | ICD-10-CM

## 2012-11-01 DIAGNOSIS — C50912 Malignant neoplasm of unspecified site of left female breast: Secondary | ICD-10-CM

## 2012-11-01 LAB — CBC WITH DIFFERENTIAL/PLATELET
BASO%: 0.2 % (ref 0.0–2.0)
MCHC: 32.7 g/dL (ref 31.5–36.0)
MONO#: 0.4 10*3/uL (ref 0.1–0.9)
RBC: 3.93 10*6/uL (ref 3.70–5.45)
RDW: 13.3 % (ref 11.2–14.5)
WBC: 4.6 10*3/uL (ref 3.9–10.3)
lymph#: 1.3 10*3/uL (ref 0.9–3.3)
nRBC: 0 % (ref 0–0)

## 2012-11-01 LAB — COMPREHENSIVE METABOLIC PANEL (CC13)
ALT: 13 U/L (ref 0–55)
AST: 16 U/L (ref 5–34)
Albumin: 3.4 g/dL — ABNORMAL LOW (ref 3.5–5.0)
Alkaline Phosphatase: 87 U/L (ref 40–150)
Calcium: 9.6 mg/dL (ref 8.4–10.4)
Chloride: 107 mEq/L (ref 98–107)
Potassium: 3.9 mEq/L (ref 3.5–5.1)

## 2012-11-01 MED ORDER — TRASTUZUMAB CHEMO INJECTION 440 MG
6.0000 mg/kg | Freq: Once | INTRAVENOUS | Status: AC
  Administered 2012-11-01: 693 mg via INTRAVENOUS
  Filled 2012-11-01: qty 33

## 2012-11-01 MED ORDER — SODIUM CHLORIDE 0.9 % IV SOLN
Freq: Once | INTRAVENOUS | Status: AC
  Administered 2012-11-01: 13:00:00 via INTRAVENOUS

## 2012-11-01 MED ORDER — SODIUM CHLORIDE 0.9 % IJ SOLN
10.0000 mL | INTRAMUSCULAR | Status: DC | PRN
  Administered 2012-11-01: 10 mL
  Filled 2012-11-01: qty 10

## 2012-11-01 MED ORDER — HEPARIN SOD (PORK) LOCK FLUSH 100 UNIT/ML IV SOLN
500.0000 [IU] | Freq: Once | INTRAVENOUS | Status: AC | PRN
  Administered 2012-11-01: 500 [IU]
  Filled 2012-11-01: qty 5

## 2012-11-01 MED ORDER — ACETAMINOPHEN 325 MG PO TABS
650.0000 mg | ORAL_TABLET | Freq: Once | ORAL | Status: AC
  Administered 2012-11-01: 650 mg via ORAL

## 2012-11-01 NOTE — Telephone Encounter (Signed)
appts made and printed...td 

## 2012-11-01 NOTE — Progress Notes (Signed)
ID: Linda [name]   DOB: 31-Aug-1945  MR#: [id number]  EXB#:[id number]  PCP: Sherron Monday, MD GYN: SUAbigail Miyamoto OTHER MD: Etter Sjogren, Dan Bensimhon  HISTORY OF PRESENT ILLNESS: The patient noted a small amount of drainage from her left breast December of 2012. She brought it to her gynecologist's attention in January of 2013 and was set up for bilateral diagnostic mammography at the breast Center July 24, 2011. This was the patient's first ever mammogram. It showed a large irregular mass in the left retroareolar region extending to the nipple, with nipple retraction and skin thickening. This measured approximately 8.4 cm. It was associated with pleomorphic microcalcifications. By exam there was moderate distortion and retraction of the nipple with a large palpable ill-defined area in the retroareolar region. In the right right breast there was also a 2 cm hard mass palpated. Ultrasound of the right breast mass showed a complex cystic/solid area measuring 1.9 cm. Ultrasound of the right axilla was negative. Ultrasound of the left breast showed a large hypoechoic mass measuring at least 3.8 cm. The left axilla showed several adjacent abnormal appearing lymph nodes.  With this information biopsy of the right and left breast masses were obtained the same day, and showed ([id number])  (a) on the right, and invasive ductal carcinoma with papillary features, estrogen and progesterone receptor positive, HER-2 negative, with an MIB-1 of 10%.  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 The MIB-1 was 60% for the left-sided tumor.  With this information the patient was presented at the multidisciplinary breast cancer conference 08/06/2011. Subsequent evaluation and treatments are as detailed below.  INTERVAL HISTORY: Linda [name] returns today for followup of her right and left breast cancers. She  completes her one year of trastuzumab today. She has tolerated this remarkably well, and is scheduled for her final echo later this week   REVIEW OF SYSTEMS: Linda [name] is also tolerating the anastrozole without significant concerns. She has no problems with hot flashes and no change in vaginal dryness. She's being about $10 a month for this medication. Her hair is back and she likes it. 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 A detailed review of systems is otherwise noncontributory.  PAST MEDICAL HISTORY: Past Medical History  Diagnosis Date  . Cancer 08-13-11    07-31-11-Dx. Bilarteral Breast cancer-left greater than rt.  . Hematuria, undiagnosed cause 08-13-11    Being evaluated by [facility] 08-14-11  . Hypertension   . Bronchitis     hx  . GERD (gastroesophageal reflux disease)     doing well  . 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 Seroma 02/04/12    right breast  200cc removed  erythema on right side  . Seasonal allergies   . History of radiation therapy 02/20/12-04/15/12    left breast,total 60.4 Gy    PAST SURGICAL HISTORY: Past Surgical History  Procedure Laterality Date  . Child birth  08-13-11    x3 -NVD  . Portacath placement  08/15/2011    Procedure: INSERTION PORT-A-CATH;  Surgeon: Shelly Rubenstein, MD;  Location: [facility];  Service: General;  Laterality: N/A;  . Mastectomy w/ sentinel node biopsy  01/21/2012    Procedure: MASTECTOMY WITH SENTINEL LYMPH NODE  BIOPSY;  Surgeon: Shelly Rubenstein, MD;  Location: [facility];  Service: General;  Laterality: Bilateral;  Left modified radical mastectomy, Rt mastectomy with Sentinel lymphnode biospy  . Breast surgery      FAMILY HISTORY Family History  Problem Relation Age of Onset  . Heart disease Mother   . Cancer Mother 97    breast, ,  14 deceased  . Heart attack Father   . Cancer Sister 67    breast  The patient's father died from a heart attack at the age of 16. The patient's mother died from apparently heart problems at the age of 28. The patient had no brothers. She has 3 sisters. One of her sisters was diagnosed with breast cancer in her mid 66s. The patient does not know if his sister was ever genetically tested. The patient's mother had a mastectomy at the age of 30, presumably for breast cancer. There is no other history of breast or in cancer in the family to her knowledge.   GYNECOLOGIC HISTORY:  She does not recall when she had menarche. She had her first child at age 71. She is GX P3. She underwent menopause in her mid 37s. She never took hormone replacement.   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 She moved to [place] about 2 years ago but has a Museum/gallery curator in [place]. Son Thayer Ohm lives in [place] and works as a IT sales professional. His wife is a Engineer, civil (consulting). Son Onalee Hua lives at [place] and is a Art therapist in addition to having the [employer]. The patient attends a local [employer] here   ADVANCED DIRECTIVES: Not in place  HEALTH MAINTENANCE: History  Substance Use Topics  . Smoking status: Never Smoker   . Smokeless tobacco: Never Used  . Alcohol Use: Yes     Comment: rare- occ.     Colonoscopy: Never  PAP: Feb 2013  Bone density: November 2013, normal  Lipid panel:  Allergies  Allergen Reactions  . Ace Inhibitors Cough    Current Outpatient Prescriptions  Medication Sig Dispense Refill  . anastrozole (ARIMIDEX) 1 MG tablet Take 1 tablet (1 mg total) by mouth daily.  90 tablet  12  . b complex vitamins tablet Take 1 tablet by mouth daily.      . carvedilol (COREG) 3.125 MG tablet TAKE 1 TABLET BY MOUTH TWICE  DAILY WITH A MEAL  60 tablet  3  . losartan-hydrochlorothiazide (HYZAAR) 50-12.5 MG per tablet Take 1 tablet by mouth daily.  30 tablet  3  . Multiple Vitamins-Iron (MULTIVITAMINS WITH IRON) TABS Take 1 tablet by mouth daily.       No current facility-administered medications for this visit.    OBJECTIVE: [name] who appears well Filed Vitals:   11/01/12 1124  BP: 126/76  Pulse: 73  Temp: 98.3 F (36.8 C)  Resp: 20     Body mass index is 38.1 kg/(m^2).    ECOG FS: 0  Filed Weights   11/01/12 1124  Weight: 250 lb 8 oz (113.626 kg)   Sclerae unicteric Oropharynx clear No cervical or supraclavicular adenopathy Lungs no rales or rhonchi Heart regular rate and rhythm Abdomen soft, obese, nontender, positive bowel sounds MSK no focal spinal tenderness, no peripheral edema Neuro: nonfocal, well oriented,  positive affect  Breasts: Status post bilateral mastectomies. There is no evidence of recurrence. Both axillae are clear.  LAB RESULTS: Lab Results  Component Value Date   WBC 4.6 11/01/2012   NEUTROABS 2.8 11/01/2012   HGB 12.0 11/01/2012   HCT 36.7 11/01/2012   MCV 93.4 11/01/2012   PLT 176 11/01/2012      Chemistry      Component Value Date/Time   NA 141 06/29/2012 0902   NA 140 02/03/2012 1125   K 4.0 06/29/2012 0902   K 3.5 02/03/2012 1125   CL 104 06/29/2012 0902   CL 102 02/03/2012 1125   CO2 28 06/29/2012 0902   CO2 28 02/03/2012 1125   BUN 15.0 06/29/2012 0902   BUN 12 02/03/2012 1125   CREATININE 0.7 06/29/2012 0902   CREATININE 0.55 02/03/2012 1125      Component Value Date/Time   CALCIUM 9.9 06/29/2012 0902   CALCIUM 8.9 02/03/2012 1125   ALKPHOS 79 06/29/2012 0902   ALKPHOS 99 02/03/2012 1125   AST 16 06/29/2012 0902   AST 35 02/03/2012 1125   ALT 16 06/29/2012 0902   ALT 65* 02/03/2012 1125   BILITOT 0.42 06/29/2012 0902   BILITOT 0.2* 02/03/2012 1125       Lab Results  Component Value Date   LABCA2 87* 08/06/2011    STUDIES:  Echocardiogram,  07/19/2012, EF = 60 - 65 %.  ASSESSMENT: 67 y.o.  Okay woman   (1)  status post bilateral breast biopsies 07/24/2011, showing,      (a) on the right, a clinical T2 N0 papillary/ductal breast cancer, estrogen and progesterone receptor positive, HER-2 negative, with an MIB-1 of 10%;     (b) on the left, a clinical T3 N1, stage IIIA invasive ductal carcinoma, grade 3, triple positive, with an MIB-1 of 60%.  (2)  Status post 4 dose dense cycles of doxorubicin/ cyclophosphamide followed by 4 dose dense cycles of paclitaxel and trastuzumab completed 12/09/2011  (3) the trastuzumab was continued for a total of one year (to 11/01/2012). Final echo pending  (4) s/p bilateral mastectomies 01/21/2012 showing   (a) on the Right, an 8 mm invasive papillary carcinoma, grade 1, ypT1b ypN0   (b) on the Left, multiple microscopic foci of residual  Invasive ductal carcinoma with evidence of dermal lymphatic involvement, pyT1a/T4 pyN0  (5)  Postmastectomy radiation, completed 04/15/2012  (6) Started anastrazole 04/16/2012; normal dexa scan 05/03/2012 at the breast center   PLAN: Linda [name] is doing terrific as far as her breast cancer is concerned, and she is completing her trastuzumab treatments today. She is already scheduled to see Dr. Rayburn Ma in July and she can have her port removed at that time. She's can see [place] again in October and then again in April of 2015. From that point we will consider seeing her in a once a year basis alternating with Dr. Rayburn Ma, who could see her 6 months after that visit. The overall plan is to continue anastrozole for total of 5 years.  Diagnosed to call for any problems that may develop before the next visit.  Baraka Klatt C    11/01/2012

## 2012-11-04 ENCOUNTER — Encounter (HOSPITAL_COMMUNITY): Payer: Self-pay

## 2012-11-04 ENCOUNTER — Ambulatory Visit (HOSPITAL_COMMUNITY)
Admission: RE | Admit: 2012-11-04 | Discharge: 2012-11-04 | Disposition: A | Discharge status: Home / Self Care | Payer: Medicare Other | Source: Ambulatory Visit | Attending: Obstetrics and Gynecology | Admitting: Obstetrics and Gynecology

## 2012-11-04 ENCOUNTER — Ambulatory Visit (HOSPITAL_BASED_OUTPATIENT_CLINIC_OR_DEPARTMENT_OTHER)
Admission: RE | Admit: 2012-11-04 | Discharge: 2012-11-04 | Disposition: A | Discharge status: Home / Self Care | Payer: Medicare Other | Source: Ambulatory Visit | Attending: Internal Medicine | Admitting: Internal Medicine

## 2012-11-04 VITALS — BP 142/80 | HR 74 | Wt 248.4 lb

## 2012-11-04 DIAGNOSIS — C50919 Malignant neoplasm of unspecified site of unspecified female breast: Secondary | ICD-10-CM

## 2012-11-04 DIAGNOSIS — I379 Nonrheumatic pulmonary valve disorder, unspecified: Secondary | ICD-10-CM | POA: Insufficient documentation

## 2012-11-04 DIAGNOSIS — Z09 Encounter for follow-up examination after completed treatment for conditions other than malignant neoplasm: Secondary | ICD-10-CM

## 2012-11-04 DIAGNOSIS — I359 Nonrheumatic aortic valve disorder, unspecified: Secondary | ICD-10-CM | POA: Insufficient documentation

## 2012-11-04 DIAGNOSIS — C50912 Malignant neoplasm of unspecified site of left female breast: Secondary | ICD-10-CM

## 2012-11-04 DIAGNOSIS — I079 Rheumatic tricuspid valve disease, unspecified: Secondary | ICD-10-CM | POA: Insufficient documentation

## 2012-11-04 DIAGNOSIS — C50911 Malignant neoplasm of unspecified site of right female breast: Secondary | ICD-10-CM

## 2012-11-04 NOTE — Progress Notes (Signed)
  Echocardiogram 2D Echocardiogram has been performed.  Jorje Guild 11/04/2012, 11:57 AM

## 2012-11-07 NOTE — Assessment & Plan Note (Signed)
I reviewed echos personally. EF and Doppler parameters stable. No HF on exam. She has finished Herceptin. F/u PRN.   

## 2012-11-07 NOTE — Progress Notes (Signed)
Oncologist: Dr Darnelle Catalan  HPI:  Linda [name] is a 67 year old [place] woman status post bilateral breast biopsies 07/24/2011, showing, on the right, a clinical T2 N0 papillary/ductal breast cancer, estrogen and progesterone receptor positive, HER-2 negative, with an MIB-1 of 10%; on the left, a T3 N1 invasive ductal carcinoma, grade 3, triple positive, with an MIB-1 of 60%. She will begin  cyclophosphamide and doxorubicin  in dose dense fashion x4, to be followed by paclitaxel and trastuzumab again every 2 weeks x4, with the trastuzumab continue to complete a year. When she completes the 8 weeks of chemotherapy as she will be ready for surgery.   Echos: 08/20/11 EF 60-65% Lateral S' 8.5 mod LVH  12/04/11 EF 60-65% lateral s' 7.9  Mild LVH (poor window) 04/22/12 EF 55-60% lateral s' 7.0 (poor window) 07/19/12 EF 60-65% lat s' 7.8 (poor window) 11/04/12 EF 60-65% lat s' 7.5 (poor window quality)  She returns for follow up today.  She finished herceptin therapy Monday.  She feels great.  She denies SOB/orthopnea/PND.  No fever/chills.      Review of Systems: All pertinent positives and negatives as in HPI, otherwise negative.   Past Medical History  Diagnosis Date  . Cancer 08-13-11    07-31-11-Dx. Bilarteral Breast cancer-left greater than rt.  . Hematuria, undiagnosed cause 08-13-11    Being evaluated by [facility] 08-14-11  . Hypertension   . Bronchitis     hx  . GERD (gastroesophageal reflux disease)     doing well  . 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 Seroma 02/04/12    right breast  200cc removed  erythema on right side  . Seasonal allergies   . History of radiation therapy 02/20/12-04/15/12    left breast,total 60.4 Gy    Current Outpatient Prescriptions  Medication Sig Dispense Refill  . anastrozole (ARIMIDEX) 1 MG tablet Take 1 tablet (1 mg total) by mouth daily.  90  tablet  12  . b complex vitamins tablet Take 1 tablet by mouth daily.      . carvedilol (COREG) 3.125 MG tablet TAKE 1 TABLET BY MOUTH TWICE DAILY WITH A MEAL  60 tablet  3  . losartan-hydrochlorothiazide (HYZAAR) 50-12.5 MG per tablet Take 1 tablet by mouth daily.  30 tablet  3  . Multiple Vitamins-Iron (MULTIVITAMINS WITH IRON) TABS Take 1 tablet by mouth daily.       No current facility-administered medications for this encounter.     Allergies  Allergen Reactions  . Ace Inhibitors Cough     PHYSICAL EXAM: Filed Vitals:   11/04/12 1210  BP: 142/80  Pulse: 74  Weight: 248 lb 6.4 oz (112.674 kg)  SpO2: 95%   General:  Obese. Well appearing. No respiratory difficulty HEENT: normal Neck: supple. JVD difficult to assess. Carotids 2+ bilat; no bruits. No lymphadenopathy or thryomegaly appreciated. Cor: PMI nondisplaced. Regular rate & rhythm. No rubs, gallops or murmurs. Lungs: clear Abdomen: soft, obese nontender, nondistended.No bruits or masses. Good bowel sounds. Extremities: no cyanosis, clubbing, rash, edema Neuro: alert & oriented x 3, cranial nerves grossly intact. moves all 4 extremities w/o difficulty. Affect pleasant.  ASSESSMENT & PLAN:

## 2012-12-20 ENCOUNTER — Other Ambulatory Visit (HOSPITAL_COMMUNITY): Payer: Self-pay | Admitting: Adult Health

## 2012-12-20 ENCOUNTER — Encounter (INDEPENDENT_AMBULATORY_CARE_PROVIDER_SITE_OTHER): Payer: Self-pay | Admitting: Surgery

## 2012-12-20 ENCOUNTER — Ambulatory Visit (INDEPENDENT_AMBULATORY_CARE_PROVIDER_SITE_OTHER): Payer: Medicare Other | Admitting: Surgery

## 2012-12-20 VITALS — BP 112/76 | HR 89 | Temp 96.3°F | Ht 68.0 in | Wt 244.8 lb

## 2012-12-20 DIAGNOSIS — Z853 Personal history of malignant neoplasm of breast: Secondary | ICD-10-CM

## 2012-12-20 NOTE — Progress Notes (Signed)
Subjective:     Patient ID: Linda [name], female   DOB: Apr 16, 1946, 67 y.o.   MRN: [id number]  HPI She is here for another long-term visit regarding her bilateral breast cancers. She is doing well and the Port-A-Cath is now rated it come out.  Review of Systems     Objective:   Physical Exam On exam, but her incisions are well healed with no palpable masses and there is no axillary adenopathy    Assessment:     Patient did well with a long-term history of bilateral breast cancer     Plan:     I will now schedule her for Port-A-Cath removal

## 2012-12-21 ENCOUNTER — Encounter (HOSPITAL_BASED_OUTPATIENT_CLINIC_OR_DEPARTMENT_OTHER)
Admission: RE | Admit: 2012-12-21 | Discharge: 2012-12-21 | Disposition: A | Discharge status: Home / Self Care | Payer: Medicare Other | Source: Ambulatory Visit | Attending: Surgery | Admitting: Surgery

## 2012-12-21 ENCOUNTER — Encounter (HOSPITAL_BASED_OUTPATIENT_CLINIC_OR_DEPARTMENT_OTHER): Payer: Self-pay | Admitting: *Deleted

## 2012-12-21 LAB — POCT I-STAT, CHEM 8
Creatinine, Ser: 0.7 mg/dL (ref 0.50–1.10)
HCT: 37 % (ref 36.0–46.0)
Hemoglobin: 12.6 g/dL (ref 12.0–15.0)
Sodium: 142 mEq/L (ref 135–145)
TCO2: 32 mmol/L (ref 0–100)

## 2012-12-21 MED ORDER — LACTATED RINGERS IV SOLN
INTRAVENOUS | Status: DC
  Administered 2012-12-22: 08:00:00 via INTRAVENOUS

## 2012-12-21 NOTE — Progress Notes (Signed)
To come in for bmet 

## 2012-12-22 ENCOUNTER — Ambulatory Visit (HOSPITAL_BASED_OUTPATIENT_CLINIC_OR_DEPARTMENT_OTHER)
Admission: RE | Admit: 2012-12-22 | Discharge: 2012-12-22 | Disposition: A | Discharge status: Home / Self Care | Payer: Medicare Other | Source: Ambulatory Visit | Attending: Surgery | Admitting: Surgery

## 2012-12-22 ENCOUNTER — Encounter (HOSPITAL_BASED_OUTPATIENT_CLINIC_OR_DEPARTMENT_OTHER): Payer: Self-pay | Admitting: Anesthesiology

## 2012-12-22 ENCOUNTER — Encounter (HOSPITAL_BASED_OUTPATIENT_CLINIC_OR_DEPARTMENT_OTHER)
Admission: RE | Disposition: A | Discharge status: Home / Self Care | Payer: Self-pay | Source: Ambulatory Visit | Attending: Surgery

## 2012-12-22 ENCOUNTER — Ambulatory Visit (HOSPITAL_BASED_OUTPATIENT_CLINIC_OR_DEPARTMENT_OTHER): Payer: Medicare Other | Admitting: Anesthesiology

## 2012-12-22 ENCOUNTER — Encounter (HOSPITAL_BASED_OUTPATIENT_CLINIC_OR_DEPARTMENT_OTHER): Payer: Self-pay | Admitting: *Deleted

## 2012-12-22 DIAGNOSIS — Z9010 Acquired absence of unspecified breast and nipple: Secondary | ICD-10-CM | POA: Insufficient documentation

## 2012-12-22 DIAGNOSIS — Z79811 Long term (current) use of aromatase inhibitors: Secondary | ICD-10-CM | POA: Insufficient documentation

## 2012-12-22 DIAGNOSIS — Z79899 Other long term (current) drug therapy: Secondary | ICD-10-CM | POA: Insufficient documentation

## 2012-12-22 DIAGNOSIS — Z888 Allergy status to other drugs, medicaments and biological substances status: Secondary | ICD-10-CM | POA: Insufficient documentation

## 2012-12-22 DIAGNOSIS — Z452 Encounter for adjustment and management of vascular access device: Secondary | ICD-10-CM

## 2012-12-22 DIAGNOSIS — Z853 Personal history of malignant neoplasm of breast: Secondary | ICD-10-CM | POA: Insufficient documentation

## 2012-12-22 DIAGNOSIS — Z923 Personal history of irradiation: Secondary | ICD-10-CM | POA: Insufficient documentation

## 2012-12-22 DIAGNOSIS — I10 Essential (primary) hypertension: Secondary | ICD-10-CM | POA: Insufficient documentation

## 2012-12-22 DIAGNOSIS — Z9221 Personal history of antineoplastic chemotherapy: Secondary | ICD-10-CM | POA: Insufficient documentation

## 2012-12-22 DIAGNOSIS — J301 Allergic rhinitis due to pollen: Secondary | ICD-10-CM | POA: Insufficient documentation

## 2012-12-22 DIAGNOSIS — K219 Gastro-esophageal reflux disease without esophagitis: Secondary | ICD-10-CM | POA: Insufficient documentation

## 2012-12-22 HISTORY — PX: PORT-A-CATH REMOVAL: SHX5289

## 2012-12-22 HISTORY — DX: Presence of dental prosthetic device (complete) (partial): Z97.2

## 2012-12-22 SURGERY — REMOVAL PORT-A-CATH
Anesthesia: Monitor Anesthesia Care | Site: Chest | Laterality: Right

## 2012-12-22 MED ORDER — OXYCODONE HCL 5 MG PO TABS: 5.0000 mg | ORAL_TABLET | Freq: Once | ORAL | Status: DC | PRN

## 2012-12-22 MED ORDER — MIDAZOLAM HCL 5 MG/5ML IJ SOLN
INTRAMUSCULAR | Status: DC | PRN
  Administered 2012-12-22: 2 mg via INTRAVENOUS

## 2012-12-22 MED ORDER — MEPERIDINE HCL 25 MG/ML IJ SOLN: 6.2500 mg | INTRAMUSCULAR | Status: DC | PRN

## 2012-12-22 MED ORDER — LIDOCAINE HCL (PF) 1 % IJ SOLN
INTRAMUSCULAR | Status: DC | PRN
  Administered 2012-12-22: 7 mL

## 2012-12-22 MED ORDER — HYDROMORPHONE HCL PF 1 MG/ML IJ SOLN: 0.2500 mg | INTRAMUSCULAR | Status: DC | PRN

## 2012-12-22 MED ORDER — MORPHINE SULFATE 4 MG/ML IJ SOLN: 4.0000 mg | INTRAMUSCULAR | Status: DC | PRN

## 2012-12-22 MED ORDER — HYDROCODONE-ACETAMINOPHEN 5-325 MG PO TABS: 1.0000 | ORAL_TABLET | ORAL | Status: DC | PRN

## 2012-12-22 MED ORDER — ONDANSETRON HCL 4 MG/2ML IJ SOLN
INTRAMUSCULAR | Status: DC | PRN
  Administered 2012-12-22: 4 mg via INTRAVENOUS

## 2012-12-22 MED ORDER — FENTANYL CITRATE 0.05 MG/ML IJ SOLN
INTRAMUSCULAR | Status: DC | PRN
  Administered 2012-12-22: 100 ug via INTRAVENOUS

## 2012-12-22 MED ORDER — CEFAZOLIN SODIUM-DEXTROSE 2-3 GM-% IV SOLR: 2.0000 g | INTRAVENOUS | Status: DC

## 2012-12-22 MED ORDER — SODIUM CHLORIDE 0.9 % IJ SOLN: 3.0000 mL | INTRAMUSCULAR | Status: DC | PRN

## 2012-12-22 MED ORDER — ONDANSETRON HCL 4 MG/2ML IJ SOLN: 4.0000 mg | Freq: Four times a day (QID) | INTRAMUSCULAR | Status: DC | PRN

## 2012-12-22 MED ORDER — OXYCODONE HCL 5 MG PO TABS: 5.0000 mg | ORAL_TABLET | ORAL | Status: DC | PRN

## 2012-12-22 MED ORDER — ACETAMINOPHEN 325 MG PO TABS: 650.0000 mg | ORAL_TABLET | ORAL | Status: DC | PRN

## 2012-12-22 MED ORDER — ACETAMINOPHEN 650 MG RE SUPP: 650.0000 mg | RECTAL | Status: DC | PRN

## 2012-12-22 MED ORDER — ONDANSETRON HCL 4 MG/2ML IJ SOLN: 4.0000 mg | Freq: Once | INTRAMUSCULAR | Status: DC | PRN

## 2012-12-22 MED ORDER — OXYCODONE HCL 5 MG/5ML PO SOLN: 5.0000 mg | Freq: Once | ORAL | Status: DC | PRN

## 2012-12-22 MED ORDER — PROPOFOL 10 MG/ML IV BOLUS
INTRAVENOUS | Status: DC | PRN
  Administered 2012-12-22: 20 mg via INTRAVENOUS

## 2012-12-22 MED ORDER — SODIUM CHLORIDE 0.9 % IJ SOLN: 3.0000 mL | Freq: Two times a day (BID) | INTRAMUSCULAR | Status: DC

## 2012-12-22 MED ORDER — SODIUM CHLORIDE 0.9 % IV SOLN: 250.0000 mL | INTRAVENOUS | Status: DC | PRN

## 2012-12-22 SURGICAL SUPPLY — 33 items
BENZOIN TINCTURE PRP APPL 2/3 (GAUZE/BANDAGES/DRESSINGS) ×2 IMPLANT
BLADE HEX COATED 2.75 (ELECTRODE) ×2 IMPLANT
BLADE SURG 15 STRL LF DISP TIS (BLADE) ×1 IMPLANT
BLADE SURG 15 STRL SS (BLADE) ×1
CHLORAPREP W/TINT 26ML (MISCELLANEOUS) ×2 IMPLANT
CLOTH BEACON ORANGE TIMEOUT ST (SAFETY) ×2 IMPLANT
COVER MAYO STAND STRL (DRAPES) ×2 IMPLANT
COVER TABLE BACK 60X90 (DRAPES) ×2 IMPLANT
DECANTER SPIKE VIAL GLASS SM (MISCELLANEOUS) IMPLANT
DRAPE PED LAPAROTOMY (DRAPES) ×2 IMPLANT
DRAPE UTILITY XL STRL (DRAPES) ×2 IMPLANT
DRSG TEGADERM 4X4.75 (GAUZE/BANDAGES/DRESSINGS) ×2 IMPLANT
ELECT REM PT RETURN 9FT ADLT (ELECTROSURGICAL) ×2
ELECTRODE REM PT RTRN 9FT ADLT (ELECTROSURGICAL) ×1 IMPLANT
GAUZE SPONGE 4X4 12PLY STRL LF (GAUZE/BANDAGES/DRESSINGS) ×2 IMPLANT
GLOVE SURG SIGNA 7.5 PF LTX (GLOVE) ×2 IMPLANT
GLOVE SURG SS PI 7.0 STRL IVOR (GLOVE) ×2 IMPLANT
GOWN PREVENTION PLUS XLARGE (GOWN DISPOSABLE) ×2 IMPLANT
GOWN PREVENTION PLUS XXLARGE (GOWN DISPOSABLE) ×2 IMPLANT
NEEDLE HYPO 25X1 1.5 SAFETY (NEEDLE) ×2 IMPLANT
NS IRRIG 1000ML POUR BTL (IV SOLUTION) ×2 IMPLANT
PACK BASIN DAY SURGERY FS (CUSTOM PROCEDURE TRAY) ×2 IMPLANT
PENCIL BUTTON HOLSTER BLD 10FT (ELECTRODE) ×2 IMPLANT
SLEEVE SCD COMPRESS KNEE MED (MISCELLANEOUS) IMPLANT
STRIP CLOSURE SKIN 1/2X4 (GAUZE/BANDAGES/DRESSINGS) ×2 IMPLANT
SUT MNCRL AB 4-0 PS2 18 (SUTURE) ×2 IMPLANT
SUT SILK 2 0 SH (SUTURE) IMPLANT
SUT VIC AB 3-0 SH 27 (SUTURE) ×1
SUT VIC AB 3-0 SH 27X BRD (SUTURE) ×1 IMPLANT
SYR BULB 3OZ (MISCELLANEOUS) IMPLANT
SYR CONTROL 10ML LL (SYRINGE) ×2 IMPLANT
TOWEL OR 17X24 6PK STRL BLUE (TOWEL DISPOSABLE) ×2 IMPLANT
TOWEL OR NON WOVEN STRL DISP B (DISPOSABLE) ×2 IMPLANT

## 2012-12-22 NOTE — Anesthesia Preprocedure Evaluation (Addendum)
Anesthesia Evaluation  Patient identified by MRN, date of birth, ID band Patient awake    Reviewed: Allergy & Precautions, H&P , NPO status , Patient's Chart, lab work & pertinent test results  History of Anesthesia Complications Negative for: history of anesthetic complications  Airway Mallampati: I TM Distance: >3 FB Neck ROM: Full    Dental   Pulmonary neg pulmonary ROS,          Cardiovascular hypertension, Pt. on medications     Neuro/Psych negative neurological ROS     GI/Hepatic GERD-  ,  Endo/Other    Renal/GU      Musculoskeletal   Abdominal   Peds  Hematology   Anesthesia Other Findings   Reproductive/Obstetrics                          Anesthesia Physical Anesthesia Plan  ASA: III  Anesthesia Plan: MAC   Post-op Pain Management:    Induction: Intravenous  Airway Management Planned: Simple Face Mask  Additional Equipment:   Intra-op Plan:   Post-operative Plan:   Informed Consent: I have reviewed the patients History and Physical, chart, labs and discussed the procedure including the risks, benefits and alternatives for the proposed anesthesia with the patient or authorized representative who has indicated his/her understanding and acceptance.     Plan Discussed with: CRNA and Surgeon  Anesthesia Plan Comments:         Anesthesia Quick Evaluation

## 2012-12-22 NOTE — Op Note (Signed)
NAMEMONIA, TIMMERS NO.:  [id number]  MEDICAL RECORD NO.:  [id number]  LOCATION:                               FACILITY:  [facility]  PHYSICIAN:  Abigail Miyamoto, M.D. DATE OF BIRTH:  06/03/1946  DATE OF PROCEDURE:  12/22/2012 DATE OF DISCHARGE:  12/22/2012                              OPERATIVE REPORT   PREOPERATIVE DIAGNOSIS:  Port-A-Cath.  POSTOPERATIVE DIAGNOSIS:  Port-A-Cath.  PROCEDURE:  Removal of Port-A-Cath.  SURGEON:  Abigail Miyamoto, M.D.  ANESTHESIA:  1% lidocaine and monitored anesthesia care.  ESTIMATED BLOOD LOSS:  Minimal.  INDICATIONS:  This is a 67 year old female with a previous history of breast cancer, has now finished chemotherapy.  She now presents for [facility]- A-Cath removal.  PROCEDURE IN DETAIL:  The patient was brought to the operating room, identified as Linda [name].  She was placed supine on the operative table and anesthesia was induced.  Her right chest was prepped and draped in usual sterile fashion.  I anesthetized the previous scar on her right upper chest with lidocaine.  I then made an incision with a scalpel, took this down to the port itself.  I then opened up the capsule and was able to remove the sutures easily and then pulled the port and the catheter in its entirety from the chest.  I then closed the needle tract site with a figure-of-eight 3-0 Vicryl suture.  Hemostasis was achieved with cautery.  I then closed the subcutaneous tissue with interrupted 3-0 Vicryl sutures and closed the skin with running 4-0 Monocryl.  Steri-Strips, gauze, and Tegaderm were then applied.  The patient tolerated the procedure well.  All the counts were correct at the end of procedure.  The patient was then taken in a stable condition from the operating room to the recovery room.     Abigail Miyamoto, M.D.     DB/MEDQ  D:  12/22/2012  T:  12/22/2012  Job:  [id number]

## 2012-12-22 NOTE — Anesthesia Procedure Notes (Signed)
Procedure Name: MAC Date/Time: 12/22/2012 8:30 AM Performed by: Zenia Resides D Pre-anesthesia Checklist: Patient identified, Emergency Drugs available, Suction available, Patient being monitored and Timeout performed Patient Re-evaluated:Patient Re-evaluated prior to inductionOxygen Delivery Method: Simple face mask Placement Confirmation: CO2 detector and positive ETCO2

## 2012-12-22 NOTE — H&P (Signed)
Linda [name] is an 67 y.o. female.   Chief Complaint: Port-A-Cath HPI: This is a pleasant female who has finished her chemotherapy for breast cancer and no longer needs her Port-A-Cath. She now presents for Port-A-Cath removal. She has no complaints  Past Medical History  Diagnosis Date  . Cancer 08-13-11    07-31-11-Dx. Bilarteral Breast cancer-left greater than rt.  . Hematuria, undiagnosed cause 08-13-11    Being evaluated by [facility] 08-14-11  . Hypertension   . Bronchitis     hx  . GERD (gastroesophageal reflux disease)     doing well  . 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 Seroma 02/04/12    right breast  200cc removed  erythema on right side  . Seasonal allergies   . History of radiation therapy 02/20/12-04/15/12    left breast,total 60.4 Gy  . Wears partial dentures     wears upper and lower partial    Past Surgical History  Procedure Laterality Date  . Child birth  08-13-11    x3 -NVD  . Portacath placement  08/15/2011    Procedure: INSERTION PORT-A-CATH;  Surgeon: Shelly Rubenstein, MD;  Location: [facility];  Service: General;  Laterality: N/A;  . Mastectomy w/ sentinel node biopsy  01/21/2012    Procedure: MASTECTOMY WITH SENTINEL LYMPH NODE BIOPSY;  Surgeon: Shelly Rubenstein, MD;  Location: [facility];  Service: General;  Laterality: Bilateral;  Left modified radical mastectomy, Rt mastectomy with Sentinel lymphnode biospy  . Breast surgery      Family History  Problem Relation Age of Onset  . Heart disease Mother   . Cancer Mother 7    breast, , 60 deceased  . Heart attack Father   . Cancer Sister 91    breast   Social History:  reports that she has never smoked. She has never used smokeless tobacco. She reports that  drinks alcohol. She reports that she does not use illicit drugs.  Allergies:  Allergies  Allergen Reactions   . Ace Inhibitors Cough    Medications Prior to Admission  Medication Sig Dispense Refill  . anastrozole (ARIMIDEX) 1 MG tablet Take 1 tablet (1 mg total) by mouth daily.  90 tablet  12  . b complex vitamins tablet Take 1 tablet by mouth daily.      . carvedilol (COREG) 3.125 MG tablet TAKE 1 TABLET BY MOUTH TWICE DAILY WITH A MEAL  60 tablet  3  . Multiple Vitamins-Iron (MULTIVITAMINS WITH IRON) TABS Take 1 tablet by mouth daily.        Results for orders placed during the hospital encounter of 12/22/12 (from the past 48 hour(s))  POCT I-STAT, CHEM 8     Status: Abnormal   Collection Time    12/21/12  3:41 PM      Result Value Range   Sodium 142  135 - 145 mEq/L   Potassium 3.6  3.5 - 5.1 mEq/L   Chloride 103  96 - 112 mEq/L   BUN 10  6 - 23 mg/dL   Creatinine, Ser 1.61  0.50 - 1.10 mg/dL   Glucose, Bld 096 (*) 70 - 99 mg/dL   Calcium, [name] 0.45  4.09 - 1.30 mmol/L   TCO2 32  0 - 100 mmol/L   Hemoglobin 12.6  12.0 - 15.0 g/dL   HCT 37.0  36.0 - 46.0 %   No results found.  Review of Systems  All other systems reviewed and are negative.    Height 5\' 8"  (1.727 m), weight 244 lb (110.678 kg). Physical Exam  Constitutional: She is oriented to person, place, and time. She appears well-developed and well-nourished. No distress.  HENT:  Head: Normocephalic and atraumatic.  Eyes: Conjunctivae are normal. Pupils are equal, round, and reactive to light.  Neck: Normal range of motion. Neck supple. No tracheal deviation present.  Cardiovascular: Normal rate, regular rhythm and normal heart sounds.   No murmur heard. Respiratory: Effort normal and breath sounds normal. No respiratory distress. She has no wheezes.  GI: Soft.  Neurological: She is alert and oriented to person, place, and time.  Skin: Skin is warm and dry. No erythema.  Psychiatric: Her behavior is normal.     Assessment/Plan Port-A-Cath no longer needed  I will now proceed with Port-A-Cath removal. I discussed  the risk which includes but is not limited to bleeding infection. She understands and wishes to proceed  Curlie Sittner A 12/22/2012, 7:38 AM

## 2012-12-22 NOTE — Progress Notes (Signed)
Day of Surgery  Subjective: Feeling much better No pain today  Objective: Vital signs in last 24 hours: Temp:  [97.7 F (36.5 C)-98 F (36.7 C)] 97.7 F (36.5 C) (07/09 0850) Pulse Rate:  [51-73] 53 (07/09 0915) Resp:  [12-20] 12 (07/09 0915) BP: (116-140)/(67-78) 129/76 mmHg (07/09 0915) SpO2:  [94 %-96 %] 94 % (07/09 0915) Weight:  [243 lb 4 oz (110.337 kg)-244 lb (110.678 kg)] 243 lb 4 oz (110.337 kg) (07/09 0806)    Intake/Output from previous day:   Intake/Output this shift: Total I/O In: 572 [P.O.:222; I.V.:350] Out: -   comfortable  Lab Results:   Recent Labs  12/21/12 1541  HGB 12.6  HCT 37.0   BMET  Recent Labs  12/21/12 1541  NA 142  K 3.6  CL 103  GLUCOSE 114*  BUN 10  CREATININE 0.70   PT/INR No results found for this basename: LABPROT, INR,  in the last 72 hours ABG No results found for this basename: PHART, PCO2, PO2, HCO3,  in the last 72 hours  Studies/Results: No results found.  Anti-infectives: Anti-infectives   Start     Dose/Rate Route Frequency Ordered Stop   12/22/12 0846  ceFAZolin (ANCEF) IVPB 2 g/50 mL premix     2 g 100 mL/hr over 30 Minutes Intravenous On call to O.R. 12/22/12 0846 12/23/12 0559      Assessment/Plan: s/p Procedure(s): REMOVAL PORT-[name]-CATH (Right) Discharge  LOS: 0 days    Linda [name] 12/22/2012

## 2012-12-22 NOTE — Transfer of Care (Signed)
Immediate Anesthesia Transfer of Care Note  Patient: Linda [name]  Procedure(s) Performed: Procedure(s): REMOVAL PORT-A-CATH (Right)  Patient Location: PACU  Anesthesia Type:MAC  Level of Consciousness: awake, alert  and oriented  Airway & Oxygen Therapy: Patient Spontanous Breathing  Post-op Assessment: Report given to PACU RN and Post -op Vital signs reviewed and stable  Post vital signs: Reviewed and stable  Complications: No apparent anesthesia complications

## 2012-12-22 NOTE — Anesthesia Postprocedure Evaluation (Signed)
Anesthesia Post Note  Patient: Linda [name]  Procedure(s) Performed: Procedure(s) (LRB): REMOVAL PORT-A-CATH (Right)  Anesthesia type: general  Patient location: PACU  Post pain: Pain level controlled  Post assessment: Patient's Cardiovascular Status Stable  Last Vitals:  Filed Vitals:   12/22/12 0930  BP: 115/46  Pulse: 55  Temp: 36.6 C  Resp: 16    Post vital signs: Reviewed and stable  Level of consciousness: sedated  Complications: No apparent anesthesia complications

## 2012-12-22 NOTE — Op Note (Signed)
REMOVAL PORT-A-CATH  Procedure Note  JURNEI LATINI 12/22/2012   Pre-op Diagnosis: port not needed HX breast cancer     Post-op Diagnosis: same  Procedure(s): REMOVAL PORT-A-CATH  Surgeon(s): Shelly Rubenstein, MD  Anesthesia: Monitor Anesthesia Care  Staff:  Circulator: Chrystine Oiler, RN Scrub Person: Lorenza Evangelist, RN  Estimated Blood Loss: Minimal                         Hussien Greenblatt A   Date: 12/22/2012  Time: 8:46 AM

## 2012-12-23 ENCOUNTER — Encounter (HOSPITAL_BASED_OUTPATIENT_CLINIC_OR_DEPARTMENT_OTHER): Payer: Self-pay | Admitting: Surgery

## 2013-01-11 ENCOUNTER — Encounter (INDEPENDENT_AMBULATORY_CARE_PROVIDER_SITE_OTHER): Payer: Medicare Other | Admitting: Surgery

## 2013-01-28 ENCOUNTER — Encounter (INDEPENDENT_AMBULATORY_CARE_PROVIDER_SITE_OTHER): Payer: Self-pay | Admitting: Surgery

## 2013-01-28 ENCOUNTER — Ambulatory Visit (INDEPENDENT_AMBULATORY_CARE_PROVIDER_SITE_OTHER): Payer: Medicare Other | Admitting: Surgery

## 2013-01-28 VITALS — BP 130/82 | HR 72 | Temp 98.4°F | Resp 15 | Ht 68.0 in | Wt 244.2 lb

## 2013-01-28 DIAGNOSIS — Z09 Encounter for follow-up examination after completed treatment for conditions other than malignant neoplasm: Secondary | ICD-10-CM

## 2013-01-28 NOTE — Progress Notes (Signed)
Subjective:     Patient ID: Linda [name], female   DOB: [birth year], 67 y.o.   MRN: [id number]  HPI She is here for her first postop visit status post Port-A-Cath removal. She is doing well and has no complaints  Review of Systems     Objective:   Physical Exam On exam the incision is healing well    Assessment:     Stable postop     Plan:     I will see her back in one year for her  Long-term followup of breast cancer

## 2013-04-04 ENCOUNTER — Encounter: Payer: Self-pay | Admitting: Physician Assistant

## 2013-04-04 ENCOUNTER — Telehealth: Payer: Self-pay | Admitting: *Deleted

## 2013-04-04 ENCOUNTER — Encounter (INDEPENDENT_AMBULATORY_CARE_PROVIDER_SITE_OTHER): Payer: Self-pay

## 2013-04-04 ENCOUNTER — Ambulatory Visit (HOSPITAL_BASED_OUTPATIENT_CLINIC_OR_DEPARTMENT_OTHER): Payer: Medicare Other | Admitting: Physician Assistant

## 2013-04-04 ENCOUNTER — Other Ambulatory Visit (HOSPITAL_BASED_OUTPATIENT_CLINIC_OR_DEPARTMENT_OTHER): Payer: Medicare Other | Admitting: Lab

## 2013-04-04 VITALS — BP 117/74 | HR 65 | Temp 98.5°F | Resp 20 | Ht 68.0 in | Wt 242.4 lb

## 2013-04-04 DIAGNOSIS — C50911 Malignant neoplasm of unspecified site of right female breast: Secondary | ICD-10-CM

## 2013-04-04 DIAGNOSIS — C50519 Malignant neoplasm of lower-outer quadrant of unspecified female breast: Secondary | ICD-10-CM

## 2013-04-04 DIAGNOSIS — C50112 Malignant neoplasm of central portion of left female breast: Secondary | ICD-10-CM

## 2013-04-04 DIAGNOSIS — Z170 Estrogen receptor positive status [ER+]: Secondary | ICD-10-CM

## 2013-04-04 DIAGNOSIS — C50919 Malignant neoplasm of unspecified site of unspecified female breast: Secondary | ICD-10-CM

## 2013-04-04 LAB — CBC WITH DIFFERENTIAL/PLATELET
BASO%: 0.7 % (ref 0.0–2.0)
EOS%: 2.3 % (ref 0.0–7.0)
HCT: 37.4 % (ref 34.8–46.6)
LYMPH%: 27.3 % (ref 14.0–49.7)
MCH: 31.7 pg (ref 25.1–34.0)
MCHC: 33.5 g/dL (ref 31.5–36.0)
NEUT%: 60.2 % (ref 38.4–76.8)
Platelets: 208 10*3/uL (ref 145–400)

## 2013-04-04 LAB — COMPREHENSIVE METABOLIC PANEL (CC13)
ALT: 12 U/L (ref 0–55)
AST: 14 U/L (ref 5–34)
Creatinine: 0.7 mg/dL (ref 0.6–1.1)
Total Bilirubin: 0.43 mg/dL (ref 0.20–1.20)

## 2013-04-04 MED ORDER — ANASTROZOLE 1 MG PO TABS: 1.0000 mg | ORAL_TABLET | Freq: Every day | ORAL | Status: DC

## 2013-04-04 NOTE — Progress Notes (Signed)
ID: Linda [name]   DOB: [birth year]  MR#: [id number]  CSN#:[id number]  PCP: Sherron Monday, MD GYN: SUAbigail [name] OTHER MD: Etter Sjogren, Dan Bensimhon  CHIEF COMPLAINT:  Bilateral Breast Cancers   HISTORY OF PRESENT ILLNESS: The patient noted a small amount of drainage from her left breast December of 2012. She brought it to her gynecologist's attention in January of 2013 and was set up for bilateral diagnostic mammography at the [facility] July 24, 2011. This was the patient's first ever mammogram. It showed a large irregular mass in the left retroareolar region extending to the nipple, with nipple retraction and skin thickening. This measured approximately 8.4 cm. It was associated with pleomorphic microcalcifications. By exam there was moderate distortion and retraction of the nipple with a large palpable ill-defined area in the retroareolar region. In the right right breast there was also a 2 cm hard mass palpated. Ultrasound of the right breast mass showed a complex cystic/solid area measuring 1.9 cm. Ultrasound of the right axilla was negative. Ultrasound of the left breast showed a large hypoechoic mass measuring at least 3.8 cm. The left axilla showed several adjacent abnormal appearing lymph nodes.  With this information biopsy of the right and left breast masses were obtained the same day, and showed ([id number])  (a) on the right, and invasive ductal carcinoma with papillary features, estrogen and progesterone receptor positive, HER-2 negative, with an MIB-1 of 10%.  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 The MIB-1 was 60% for the left-sided tumor.  With this information the patient was presented at the multidisciplinary breast cancer conference 08/06/2011. Subsequent evaluation and treatments are as detailed below.  INTERVAL HISTORY: Linda [name] returns today for followup of  her right and left breast cancers. She completed a total of one year of trastuzumab any final echocardiogram on 11/04/2012 showed a well preserved ejection fraction of 55-60%. Linda [name] continues on anastrozole daily, with no notable side effects.  She had her port removed in July with no complications.  Interval history is generally unremarkable. Linda [name] has worked most of the year with [employer].  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 Her family is doing well, and she is expecting another granddaughter in late November.  REVIEW OF SYSTEMS: Linda [name] is also tolerating the anastrozole without significant concerns. She has no problems with hot flashes and no change in vaginal dryness. She's being about $10 a month for this medication. Her hair is back and she likes it. 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 A detailed review of systems is otherwise noncontributory.  PAST MEDICAL HISTORY: Past Medical History  Diagnosis Date  . Cancer 08-13-11    07-31-11-Dx. Bilarteral Breast cancer-left greater than rt.  . Hematuria, undiagnosed cause 08-13-11    Being evaluated by [facility] 08-14-11  . Hypertension   . Bronchitis     hx  . GERD (gastroesophageal reflux disease)     doing well  . 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 Seroma 02/04/12    right breast  200cc removed  erythema on right side  . Seasonal allergies   . History of radiation  therapy 02/20/12-04/15/12    left breast,total 60.4 Gy  . Wears partial dentures     wears upper and lower partial    PAST SURGICAL HISTORY: Past Surgical History  Procedure Laterality Date  . Child birth  08-13-11    x3 -NVD  . Portacath placement  08/15/2011    Procedure: INSERTION PORT-A-CATH;  Surgeon:  Shelly Rubenstein, MD;  Location: [facility];  Service: General;  Laterality: N/A;  . Mastectomy w/ sentinel node biopsy  01/21/2012    Procedure: MASTECTOMY WITH SENTINEL LYMPH NODE BIOPSY;  Surgeon: Shelly Rubenstein, MD;  Location: [facility];  Service: General;  Laterality: Bilateral;  Left modified radical mastectomy, Rt mastectomy with Sentinel lymphnode biospy  . Breast surgery    . Port-a-cath removal Right 12/22/2012    Procedure: REMOVAL PORT-A-CATH;  Surgeon: Shelly Rubenstein, MD;  Location: [facility];  Service: General;  Laterality: Right;    FAMILY HISTORY Family History  Problem Relation Age of Onset  . Heart disease Mother   . Cancer Mother 67    breast, , 33 deceased  . Heart attack Father   . Cancer Sister 77    breast  The patient's father died from a heart attack at the age of 23. The patient's mother died from apparently heart problems at the age of 53. The patient had no brothers. She has 3 sisters. One of her sisters was diagnosed with breast cancer in her mid 58s. The patient does not know if his sister was ever genetically tested. The patient's mother had a mastectomy at the age of 56, presumably for breast cancer. There is no other history of breast or in cancer in the family to her knowledge.   GYNECOLOGIC HISTORY:  She does not recall when she had menarche. She had her first child at age 89. She is GX P3. She underwent menopause in her mid 81s. She never took hormone replacement.   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 She moved to [place] about 2 years ago but has a Museum/gallery curator in [place]. Son Thayer Ohm lives in [place] and works as a IT sales professional. His wife is a Engineer, civil (consulting). Son Onalee Hua lives at [employer] and is a Art therapist in addition to having the [employer]. The patient attends a local [employer] here   ADVANCED DIRECTIVES: Not in place  HEALTH MAINTENANCE: History  Substance Use Topics  . Smoking status: Never Smoker   . Smokeless tobacco: Never Used  . Alcohol Use: Yes     Comment: rare- occ.     Colonoscopy: Never  PAP: Feb 2013  Bone density: November 2013, normal  Lipid panel:   Allergies  Allergen Reactions  . Ace Inhibitors Cough    Current Outpatient Prescriptions  Medication Sig Dispense Refill  . anastrozole (ARIMIDEX) 1 MG tablet Take 1 tablet (1 mg total) by mouth daily.  30 tablet  12  . b complex vitamins tablet Take 1 tablet by mouth daily.      . carvedilol (COREG) 3.125 MG tablet TAKE 1 TABLET BY MOUTH TWICE DAILY WITH A MEAL  60 tablet  3  . HYDROcodone-acetaminophen (NORCO) 5-325 MG per tablet Take 1-2 tablets by mouth every 4 (four) hours as needed for pain.  20 tablet  1  . Multiple Vitamins-Iron (MULTIVITAMINS WITH IRON) TABS Take  1 tablet by mouth daily.       No current facility-administered medications for this visit.    OBJECTIVE: [name] who appears well Filed Vitals:   04/04/13 1119  BP: 117/74  Pulse: 65  Temp: 98.5 F (36.9 C)  Resp: 20     Body mass index is 36.87 kg/(m^2).    ECOG FS: 0 Filed Weights   04/04/13 1119  Weight: 242 lb 6.4 oz (109.952 kg)   Physical Exam: HEENT:  Sclerae anicteric.  Oropharynx clear. Buccal mucosa is pink. NODES:  No cervical or supraclavicular lymphadenopathy palpated.  BREAST EXAM:  Status post bilateral mastectomies. No suspicious skin changes or nodularity, and no evidence of local recurrence. Axillae are benign bilaterally with no palpable lymphadenopathy LUNGS:  Clear to auscultation bilaterally.  No wheezes or rhonchi HEART:  Regular rate and rhythm.  ABDOMEN:  Soft, obese, nontender.  Positive bowel sounds.  MSK:  No focal spinal tenderness to palpation. Full range of motion in the upper extremities. EXTREMITIES:  No peripheral edema.   NEURO:  Nonfocal. Well  oriented.  Positive affect.    LAB RESULTS: Lab Results  Component Value Date   WBC 5.3 04/04/2013   NEUTROABS 3.2 04/04/2013   HGB 12.5 04/04/2013   HCT 37.4 04/04/2013   MCV 94.6 04/04/2013   PLT 208 04/04/2013      Chemistry      Component Value Date/Time   NA 144 04/04/2013 1051   NA 142 12/21/2012 1541   K 4.2 04/04/2013 1051   K 3.6 12/21/2012 1541   CL 103 12/21/2012 1541   CL 107 11/01/2012 1045   CO2 29 04/04/2013 1051   CO2 28 02/03/2012 1125   BUN 16.0 04/04/2013 1051   BUN 10 12/21/2012 1541   CREATININE 0.7 04/04/2013 1051   CREATININE 0.70 12/21/2012 1541      Component Value Date/Time   CALCIUM 10.0 04/04/2013 1051   CALCIUM 8.9 02/03/2012 1125   ALKPHOS 89 04/04/2013 1051   ALKPHOS 99 02/03/2012 1125   AST 14 04/04/2013 1051   AST 35 02/03/2012 1125   ALT 12 04/04/2013 1051   ALT 65* 02/03/2012 1125   BILITOT 0.43 04/04/2013 1051   BILITOT 0.2* 02/03/2012 1125       STUDIES:  Echocardiogram, 11/04/2012 showed a well preserved ejection fraction of 55-60% after completion of trastuzumab.  Most recent bone density 05/03/2012 was normal.  ASSESSMENT: 67 y.o.  [place] woman   (1)  status post bilateral breast biopsies 07/24/2011, showing,      (a) on the right, a clinical T2 N0 papillary/ductal breast cancer, estrogen and progesterone receptor positive, HER-2 negative, with an MIB-1 of 10%;     (b) on the left, a clinical T3 N1, stage IIIA invasive ductal carcinoma, grade 3, triple positive, with an MIB-1 of 60%.  (2)  Status post 4 dose dense cycles of doxorubicin/ cyclophosphamide followed by 4 dose dense cycles of paclitaxel and trastuzumab completed 12/09/2011  (3) the trastuzumab was continued for a total of one year (to 11/01/2012). Final echo on 11/04/2012 showing a well preserved ejection fraction of 55-60%.  (4) s/p bilateral mastectomies 01/21/2012 showing   (a) on the Right, an 8 mm invasive papillary carcinoma, grade 1, ypT1b ypN0   (b) on  the Left, multiple microscopic foci of residual  Invasive ductal carcinoma with evidence of dermal lymphatic involvement, pyT1a/T4 pyN0  (5)  Postmastectomy radiation, completed 04/15/2012  (6) Started anastrazole 04/16/2012; [facility] dexa scan [facility]03/2012  [facility]   PLAN: Linda [name] appears to be doing very well with regards to her breast cancer, with no clinical evidence of disease recurrence. She'll continue on the anastrozole, the plan being to complete a total of 5 years in November of 2018. The anastrozole has been refilled for another year.  Linda [name] will see Dr. Darnelle Catalan again in April. If she still doing as well, we will likely begin annual visits, and will alternate these with Dr. Rayburn Ma. Her next bone density will be due in November of 2015.  This was all reviewed with Linda [name] who voices understanding and agreement with our plan today. She will call with any changes or problems prior to her next appointment.   Yailin Biederman  PA-C   04/04/2013

## 2013-04-04 NOTE — Telephone Encounter (Signed)
appts made and printed...td 

## 2013-05-10 ENCOUNTER — Other Ambulatory Visit (HOSPITAL_COMMUNITY): Payer: Self-pay | Admitting: Internal Medicine

## 2013-05-11 ENCOUNTER — Telehealth (HOSPITAL_COMMUNITY): Payer: Self-pay | Admitting: Adult Health

## 2013-05-11 ENCOUNTER — Other Ambulatory Visit (HOSPITAL_COMMUNITY): Payer: Self-pay | Admitting: Internal Medicine

## 2013-05-11 MED ORDER — LOSARTAN POTASSIUM-HCTZ 50-12.5 MG PO TABS: 1.0000 | ORAL_TABLET | Freq: Every day | ORAL | Status: DC

## 2013-05-11 MED ORDER — CARVEDILOL 3.125 MG PO TABS: 3.1250 mg | ORAL_TABLET | Freq: Two times a day (BID) | ORAL | Status: DC

## 2013-05-11 NOTE — Telephone Encounter (Signed)
Left message. Refill called in for Hyzaar and carvedilol to [facility].   Instructed to establish with a PCP and contact HF clinic if she has questions.   Jadavion Spoelstra 5:11 PM .

## 2013-06-22 ENCOUNTER — Encounter: Payer: Self-pay | Admitting: Physician Assistant

## 2013-06-22 ENCOUNTER — Ambulatory Visit (INDEPENDENT_AMBULATORY_CARE_PROVIDER_SITE_OTHER): Payer: Medicare Other | Admitting: Physician Assistant

## 2013-06-22 VITALS — BP 124/82 | HR 71 | Temp 98.4°F | Resp 16 | Ht 68.0 in | Wt 246.8 lb

## 2013-06-22 DIAGNOSIS — K219 Gastro-esophageal reflux disease without esophagitis: Secondary | ICD-10-CM

## 2013-06-22 DIAGNOSIS — Z7689 Persons encountering health services in other specified circumstances: Secondary | ICD-10-CM

## 2013-06-22 DIAGNOSIS — I10 Essential (primary) hypertension: Secondary | ICD-10-CM

## 2013-06-22 DIAGNOSIS — Z23 Encounter for immunization: Secondary | ICD-10-CM

## 2013-06-22 DIAGNOSIS — Z7189 Other specified counseling: Secondary | ICD-10-CM

## 2013-06-22 MED ORDER — LOSARTAN POTASSIUM-HCTZ 50-12.5 MG PO TABS: ORAL_TABLET | ORAL | Status: DC

## 2013-06-22 NOTE — Progress Notes (Signed)
Patient ID: Linda [name], female   DOB: 05-06-1946, 68 y.o.   MRN: [id number]  Patient presents to clinic today to establish care.  Acute Concerns: No acute concerns.  Request refill of BP medications.  Chronic Issues: Hypertension -- Patient diagnosed by a vascular specialist.  Currently on carvedilol BID and Hyzaar QD.  Endorses good control of BP.  BP in clinic today is 124/82.  Denies headache, chest pain, palpitations. SOB, LH, dizziness or vision changes.    Breast Cancer -- Patient diagnosed with bilateral breast cancer in 2013.  Is followed by Micah Flesher PA-C and Dr. Marjie Skiff.  Endorses cancer is in remission.  Patient is s/p double mastectomy. Endorses having BRCA testing which was negative.  Positive family history of breat cancer including patient's mother and sister.  GERD -- patient asymptomatic.    Seasonal allergies -- occasional claritin or zyrtec PRN seasonally.   Health Maintenance: Dental -- upcoming appointment [facility] -- UTD Immunizations -- Flu shot given. Pneumonia shot. Overdue Tetanus shot. Colonoscopy -- 2013; no abnormal findings Mammogram -- UTD; s/p double mastectomy for bilateral breast cancer. PAP -- Overdue; no PAP in > 10 years.  Denies hx of abnormal PAP.  Will need one more PAP and if normal, no further cytology needed. Bone Density -- 04/2012   Past Medical History  Diagnosis Date  . Cancer 08-13-11    07-31-11-Dx. Bilarteral Breast cancer-left greater than rt.  . Hematuria, undiagnosed cause 08-13-11    Being evaluated by [facility] 08-14-11  . Hypertension   . Bronchitis     hx  . GERD (gastroesophageal reflux disease)     doing well  . 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 Seroma 02/04/12    right breast  200cc removed  erythema on right side  . Seasonal allergies   . History of radiation therapy 02/20/12-04/15/12    left  breast,total 60.4 Gy  . Wears partial dentures     wears upper and lower partial    Past Surgical History  Procedure Laterality Date  . Child birth  08-13-11    x3 -NVD  . Portacath placement  08/15/2011    Procedure: INSERTION PORT-A-CATH;  Surgeon: Harl Bowie, MD;  Location: [facility];  Service: General;  Laterality: N/A;  . Mastectomy w/ sentinel node biopsy  01/21/2012    Procedure: MASTECTOMY WITH SENTINEL LYMPH NODE BIOPSY;  Surgeon: Harl Bowie, MD;  Location: [facility];  Service: General;  Laterality: Bilateral;  Left modified radical mastectomy, Rt mastectomy with Sentinel lymphnode biospy  . Breast surgery    . Port-a-cath removal Right 12/22/2012    Procedure: REMOVAL PORT-A-CATH;  Surgeon: Harl Bowie, MD;  Location: [facility];  Service: General;  Laterality: Right;    Current Outpatient Prescriptions on File Prior to Visit  Medication Sig Dispense Refill  . anastrozole (ARIMIDEX) 1 MG tablet Take 1 tablet (1 mg total) by mouth daily.  30 tablet  12  . b complex vitamins tablet Take 1 tablet by mouth daily.      . carvedilol (COREG) 3.125 MG tablet TAKE 1 TABLET BY MOUTH TWICE DAILY WITH A MEAL  60 tablet  0  . HYDROcodone-acetaminophen (NORCO) 5-325 MG per tablet Take 1-2 tablets by mouth every 4 (four) hours as needed for pain.  20 tablet  1  . losartan-hydrochlorothiazide (  HYZAAR) 50-12.5 MG per tablet TAKE 1 TABLET BY MOUTH ONCE DAILY  30 tablet  0  . Multiple Vitamins-Iron (MULTIVITAMINS WITH IRON) TABS Take 1 tablet by mouth daily.       No current facility-administered medications on file prior to visit.    Allergies  Allergen Reactions  . Ace Inhibitors Cough    Family History  Problem Relation Age of Onset  . Heart disease Mother   . Cancer Mother 22    breast, , 4 deceased  . Heart attack Father   . Cancer Sister 93    breast    History   Social History  . Marital Status: Single    Spouse Name: N/A     Number of Children: 3  . Years of Education: N/A   Occupational History  . Retired    Social History Main Topics  . Smoking status: Never Smoker   . Smokeless tobacco: Never Used  . Alcohol Use: Yes     Comment: rare- occ.  . Drug Use: No  . Sexual Activity: No     Comment: gxp3, 1st child age 32, menopause mid 54's,no hrt   Other Topics Concern  . Not on file   Social History Narrative  . No narrative on file   Review of Systems  Constitutional: Negative for fever and weight loss.  HENT: Negative for ear discharge, ear pain, hearing loss and tinnitus.   Eyes: Negative for blurred vision, double vision, photophobia and pain.  Respiratory: Negative for cough, shortness of breath and wheezing.   Cardiovascular: Negative for chest pain and palpitations.  Gastrointestinal: Negative for heartburn, nausea, vomiting, abdominal pain, diarrhea, constipation, blood in stool and melena.  Genitourinary: Negative for dysuria, urgency, frequency, hematuria and flank pain.  Musculoskeletal: Negative for back pain and myalgias.  Neurological: Negative for dizziness, seizures, loss of consciousness and headaches.  Endo/Heme/Allergies: Positive for environmental allergies.  Psychiatric/Behavioral: Negative for depression, suicidal ideas, hallucinations and substance abuse. The patient is not nervous/anxious and does not have insomnia.     Ht $R'5\' 8"'fk$  (1.727 m)  Wt 246 lb 12 oz (111.925 kg)  BMI 37.53 kg/m2  Physical Exam  Vitals reviewed. Constitutional: She is oriented to person, place, and time and well-developed, well-nourished, and in no distress.  HENT:  Head: Normocephalic and atraumatic.  Right Ear: External ear normal.  Left Ear: External ear normal.  Nose: Nose normal.  Mouth/Throat: Oropharynx is clear and moist. No oropharyngeal exudate.  TM within normal limits bilaterally.  Eyes: Conjunctivae and EOM are normal. Pupils are equal, round, and reactive to light.  Neck: Neck  supple.  Cardiovascular: Normal rate, regular rhythm, normal heart sounds and intact distal pulses.   Pulmonary/Chest: Effort normal and breath sounds normal.  Lymphadenopathy:    She has no cervical adenopathy.  Neurological: She is alert and oriented to person, place, and time.  Skin: Skin is warm and dry. No rash noted.  Psychiatric: Affect normal.    Recent Results (from the past 2160 hour(s))  CBC WITH DIFFERENTIAL     Status: None   Collection Time    04/04/13 10:51 AM      Result Value Range   WBC 5.3  3.9 - 10.3 10e3/uL   NEUT# 3.2  1.5 - 6.5 10e3/uL   HGB 12.5  11.6 - 15.9 g/dL   HCT 37.4  34.8 - 46.6 %   Platelets 208  145 - 400 10e3/uL   MCV 94.6  79.5 - 101.0 fL  MCH 31.7  25.1 - 34.0 pg   MCHC 33.5  31.5 - 36.0 g/dL   RBC 3.95  3.70 - 5.45 10e6/uL   RDW 13.2  11.2 - 14.5 %   lymph# 1.4  0.9 - 3.3 10e3/uL   MONO# 0.5  0.1 - 0.9 10e3/uL   Eosinophils Absolute 0.1  0.0 - 0.5 10e3/uL   Basophils Absolute 0.0  0.0 - 0.1 10e3/uL   NEUT% 60.2  38.4 - 76.8 %   LYMPH% 27.3  14.0 - 49.7 %   MONO% 9.5  0.0 - 14.0 %   EOS% 2.3  0.0 - 7.0 %   BASO% 0.7  0.0 - 2.0 %  COMPREHENSIVE METABOLIC PANEL (GX27)     Status: None   Collection Time    04/04/13 10:51 AM      Result Value Range   Sodium 144  136 - 145 mEq/L   Potassium 4.2  3.5 - 5.1 mEq/L   Chloride 106  98 - 109 mEq/L   CO2 29  22 - 29 mEq/L   Glucose 98  70 - 140 mg/dl   BUN 16.0  7.0 - 26.0 mg/dL   Creatinine 0.7  0.6 - 1.1 mg/dL   Total Bilirubin 0.43  0.20 - 1.20 mg/dL   Alkaline Phosphatase 89  40 - 150 U/L   AST 14  5 - 34 U/L   ALT 12  0 - 55 U/L   Total Protein 7.3  6.4 - 8.3 g/dL   Albumin 3.5  3.5 - 5.0 g/dL   Calcium 10.0  8.4 - 10.4 mg/dL   Anion Gap 9  3 - 11 mEq/L    Assessment/Plan: No problem-specific assessment & plan notes found for this encounter.

## 2013-06-22 NOTE — Patient Instructions (Addendum)
Please continue medications as prescribed.  Please schedule a Medicare Wellness Visit at the front desk.  Please read information below.  It was nice to meet you and a pleasure participating in your care.  Preventive Care for Adults, Female A healthy lifestyle and preventive care can promote health and wellness. Preventive health guidelines for women include the following key practices.  A routine yearly physical is a good way to check with your caregiver about your health and preventive screening. It is a chance to share any concerns and updates on your health, and to receive a thorough exam.  Visit your dentist for a routine exam and preventive care every 6 months. Brush your teeth twice a day and floss once a day. Good oral hygiene prevents tooth decay and gum disease.  The frequency of eye exams is based on your age, health, family medical history, use of contact lenses, and other factors. Follow your caregiver's recommendations for frequency of eye exams.  Eat a healthy diet. Foods like vegetables, fruits, whole grains, low-fat dairy products, and lean protein foods contain the nutrients you need without too many calories. Decrease your intake of foods high in solid fats, added sugars, and salt. Eat the right amount of calories for you.Get information about a proper diet from your caregiver, if necessary.  Regular physical exercise is one of the most important things you can do for your health. Most adults should get at least 150 minutes of moderate-intensity exercise (any activity that increases your heart rate and causes you to sweat) each week. In addition, most adults need muscle-strengthening exercises on 2 or more days a week.  Maintain a healthy weight. The body mass index (BMI) is a screening tool to identify possible weight problems. It provides an estimate of body fat based on height and weight. Your caregiver can help determine your BMI, and can help you achieve or maintain a healthy  weight.For adults 20 years and older:  A BMI below 18.5 is considered underweight.  A BMI of 18.5 to 24.9 is normal.  A BMI of 25 to 29.9 is considered overweight.  A BMI of 30 and above is considered obese.  Maintain normal blood lipids and cholesterol levels by exercising and minimizing your intake of saturated fat. Eat a balanced diet with plenty of fruit and vegetables. Blood tests for lipids and cholesterol should begin at age 6 and be repeated every 5 years. If your lipid or cholesterol levels are high, you are over 50, or you are at high risk for heart disease, you may need your cholesterol levels checked more frequently.Ongoing high lipid and cholesterol levels should be treated with medicines if diet and exercise are not effective.  If you smoke, find out from your caregiver how to quit. If you do not use tobacco, do not start.  Lung cancer screening is recommended for adults aged 62 80 years who are at high risk for developing lung cancer because of a history of smoking. Yearly low-dose computed tomography (CT) is recommended for people who have at least a 30-pack-year history of smoking and are a current smoker or have quit within the past 15 years. A pack year of smoking is smoking an average of 1 pack of cigarettes a day for 1 year (for example: 1 pack a day for 30 years or 2 packs a day for 15 years). Yearly screening should continue until the smoker has stopped smoking for at least 15 years. Yearly screening should also be stopped for people  who develop a health problem that would prevent them from having lung cancer treatment.  If you are pregnant, do not drink alcohol. If you are breastfeeding, be very cautious about drinking alcohol. If you are not pregnant and choose to drink alcohol, do not exceed 1 drink per day. One drink is considered to be 12 ounces (355 mL) of beer, 5 ounces (148 mL) of wine, or 1.5 ounces (44 mL) of liquor.  Avoid use of street drugs. Do not share  needles with anyone. Ask for help if you need support or instructions about stopping the use of drugs.  High blood pressure causes heart disease and increases the risk of stroke. Your blood pressure should be checked at least every 1 to 2 years. Ongoing high blood pressure should be treated with medicines if weight loss and exercise are not effective.  If you are 35 to 67 years old, ask your caregiver if you should take aspirin to prevent strokes.  Diabetes screening involves taking a blood sample to check your fasting blood sugar level. This should be done once every 3 years, after age 21, if you are within normal weight and without risk factors for diabetes. Testing should be considered at a younger age or be carried out more frequently if you are overweight and have at least 1 risk factor for diabetes.  Breast cancer screening is essential preventive care for women. You should practice "breast self-awareness." This means understanding the normal appearance and feel of your breasts and may include breast self-examination. Any changes detected, no matter how small, should be reported to a caregiver. Women in their 66s and 30s should have a clinical breast exam (CBE) by a caregiver as part of a regular health exam every 1 to 3 years. After age 90, women should have a CBE every year. Starting at age 20, women should consider having a mammography (breast X-ray test) every year. Women who have a family history of breast cancer should talk to their caregiver about genetic screening. Women at a high risk of breast cancer should talk to their caregivers about having magnetic resonance imaging (MRI) and a mammography every year.  Breast cancer gene (BRCA)-related cancer risk assessment is recommended for women who have family members with BRCA-related cancers. BRCA-related cancers include breast, ovarian, tubal, and peritoneal cancers. Having family members with these cancers may be associated with an increased  risk for harmful changes (mutations) in the breast cancer genes BRCA1 and BRCA2. Results of the assessment will determine the need for genetic counseling and BRCA1 and BRCA2 testing.  The Pap test is a screening test for cervical cancer. A Pap test can show cell changes on the cervix that might become cervical cancer if left untreated. A Pap test is a procedure in which cells are obtained and examined from the lower end of the uterus (cervix).  Women should have a Pap test starting at age 49.  Between ages 17 and 59, Pap tests should be repeated every 2 years.  Beginning at age 50, you should have a Pap test every 3 years as long as the past 3 Pap tests have been normal.  Some women have medical problems that increase the chance of getting cervical cancer. Talk to your caregiver about these problems. It is especially important to talk to your caregiver if a new problem develops soon after your last Pap test. In these cases, your caregiver may recommend more frequent screening and Pap tests.  The above recommendations are the same  for women who have or have not gotten the vaccine for human papillomavirus (HPV).  If you had a hysterectomy for a problem that was not cancer or a condition that could lead to cancer, then you no longer need Pap tests. Even if you no longer need a Pap test, a regular exam is a good idea to make sure no other problems are starting.  If you are between ages 47 and 58, and you have had normal Pap tests going back 10 years, you no longer need Pap tests. Even if you no longer need a Pap test, a regular exam is a good idea to make sure no other problems are starting.  If you have had past treatment for cervical cancer or a condition that could lead to cancer, you need Pap tests and screening for cancer for at least 20 years after your treatment.  If Pap tests have been discontinued, risk factors (such as a new sexual partner) need to be reassessed to determine if screening  should be resumed.  The HPV test is an additional test that may be used for cervical cancer screening. The HPV test looks for the virus that can cause the cell changes on the cervix. The cells collected during the Pap test can be tested for HPV. The HPV test could be used to screen women aged 6 years and older, and should be used in women of any age who have unclear Pap test results. After the age of 31, women should have HPV testing at the same frequency as a Pap test.  Colorectal cancer can be detected and often prevented. Most routine colorectal cancer screening begins at the age of 49 and continues through age 14. However, your caregiver may recommend screening at an earlier age if you have risk factors for colon cancer. On a yearly basis, your caregiver may provide home test kits to check for hidden blood in the stool. Use of a small camera at the end of a tube, to directly examine the colon (sigmoidoscopy or colonoscopy), can detect the earliest forms of colorectal cancer. Talk to your caregiver about this at age 83, when routine screening begins. Direct examination of the colon should be repeated every 5 to 10 years through age 57, unless early forms of pre-cancerous polyps or small growths are found.  Hepatitis C blood testing is recommended for all people born from [birth year] through 1965 and any individual with known risks for hepatitis C.  Practice safe sex. Use condoms and avoid high-risk sexual practices to reduce the spread of sexually transmitted infections (STIs). STIs include gonorrhea, chlamydia, syphilis, trichomonas, herpes, HPV, and human immunodeficiency virus (HIV). Herpes, HIV, and HPV are viral illnesses that have no cure. They can result in disability, cancer, and death. Sexually active women aged 51 and younger should be checked for chlamydia. Older women with new or multiple partners should also be tested for chlamydia. Testing for other STIs is recommended if you are sexually active  and at increased risk.  Osteoporosis is a disease in which the bones lose minerals and strength with aging. This can result in serious bone fractures. The risk of osteoporosis can be identified using a bone density scan. Women ages 18 and over and women at risk for fractures or osteoporosis should discuss screening with their caregivers. Ask your caregiver whether you should take a calcium supplement or vitamin D to reduce the rate of osteoporosis.  Menopause can be associated with physical symptoms and risks. Hormone replacement therapy is  available to decrease symptoms and risks. You should talk to your caregiver about whether hormone replacement therapy is right for you.  Use sunscreen. Apply sunscreen liberally and repeatedly throughout the day. You should seek shade when your shadow is shorter than you. Protect yourself by wearing long sleeves, pants, a wide-brimmed hat, and sunglasses year round, whenever you are outdoors.  Once a month, do a whole body skin exam, using a mirror to look at the skin on your back. Notify your caregiver of new moles, moles that have irregular borders, moles that are larger than a pencil eraser, or moles that have changed in shape or color.  Stay current with required immunizations.  Influenza vaccine. All adults should be immunized every year.  Tetanus, diphtheria, and acellular pertussis (Td, Tdap) vaccine. Pregnant women should receive 1 dose of Tdap vaccine during each pregnancy. The dose should be obtained regardless of the length of time since the last dose. Immunization is preferred during the 27th to 36th week of gestation. An adult who has not previously received Tdap or who does not know her vaccine status should receive 1 dose of Tdap. This initial dose should be followed by tetanus and diphtheria toxoids (Td) booster doses every 10 years. Adults with an unknown or incomplete history of completing a 3-dose immunization series with Td-containing vaccines  should begin or complete a primary immunization series including a Tdap dose. Adults should receive a Td booster every 10 years.  Varicella vaccine. An adult without evidence of immunity to varicella should receive 2 doses or a second dose if she has previously received 1 dose. Pregnant females who do not have evidence of immunity should receive the first dose after pregnancy. This first dose should be obtained before leaving the health care facility. The second dose should be obtained 4 8 weeks after the first dose.  Human papillomavirus (HPV) vaccine. Females aged 98 26 years who have not received the vaccine previously should obtain the 3-dose series. The vaccine is not recommended for use in pregnant females. However, pregnancy testing is not needed before receiving a dose. If a female is found to be pregnant after receiving a dose, no treatment is needed. In that case, the remaining doses should be delayed until after the pregnancy. Immunization is recommended for any person with an immunocompromised condition through the age of 75 years if she did not get any or all doses earlier. During the 3-dose series, the second dose should be obtained 4 8 weeks after the first dose. The third dose should be obtained 24 weeks after the first dose and 16 weeks after the second dose.  Zoster vaccine. One dose is recommended for adults aged 38 years or older unless certain conditions are present.  Measles, mumps, and rubella (MMR) vaccine. Adults born before [birth year] generally are considered immune to measles and mumps. Adults born in [birth year] or later should have 1 or more doses of MMR vaccine unless there is a contraindication to the vaccine or there is laboratory evidence of immunity to each of the three diseases. A routine second dose of MMR vaccine should be obtained at least 28 days after the first dose for students attending postsecondary schools, health care workers, or international travelers. People who received  inactivated measles vaccine or an unknown type of measles vaccine during 1963 1967 should receive 2 doses of MMR vaccine. People who received inactivated mumps vaccine or an unknown type of mumps vaccine before 1979 and are at high risk for mumps infection  should consider immunization with 2 doses of MMR vaccine. For females of childbearing age, rubella immunity should be determined. If there is no evidence of immunity, females who are not pregnant should be vaccinated. If there is no evidence of immunity, females who are pregnant should delay immunization until after pregnancy. Unvaccinated health care workers born before [birth year] who lack laboratory evidence of measles, mumps, or rubella immunity or laboratory confirmation of disease should consider measles and mumps immunization with 2 doses of MMR vaccine or rubella immunization with 1 dose of MMR vaccine.  Pneumococcal 13-valent conjugate (PCV13) vaccine. When indicated, a person who is uncertain of her immunization history and has no record of immunization should receive the PCV13 vaccine. An adult aged 46 years or older who has certain medical conditions and has not been previously immunized should receive 1 dose of PCV13 vaccine. This PCV13 should be followed with a dose of pneumococcal polysaccharide (PPSV23) vaccine. The PPSV23 vaccine dose should be obtained at least 8 weeks after the dose of PCV13 vaccine. An adult aged 59 years or older who has certain medical conditions and previously received 1 or more doses of PPSV23 vaccine should receive 1 dose of PCV13. The PCV13 vaccine dose should be obtained 1 or more years after the last PPSV23 vaccine dose.  Pneumococcal polysaccharide (PPSV23) vaccine. When PCV13 is also indicated, PCV13 should be obtained first. All adults aged 104 years and older should be immunized. An adult younger than age 34 years who has certain medical conditions should be immunized. Any person who resides in a nursing home or  long-term care facility should be immunized. An adult smoker should be immunized. People with an immunocompromised condition and certain other conditions should receive both PCV13 and PPSV23 vaccines. People with human immunodeficiency virus (HIV) infection should be immunized as soon as possible after diagnosis. Immunization during chemotherapy or radiation therapy should be avoided. Routine use of PPSV23 vaccine is not recommended for American Indians, [place] Natives, or people younger than 65 years unless there are medical conditions that require PPSV23 vaccine. When indicated, people who have unknown immunization and have no record of immunization should receive PPSV23 vaccine. One-time revaccination 5 years after the first dose of PPSV23 is recommended for people aged 18 64 years who have chronic kidney failure, nephrotic syndrome, asplenia, or immunocompromised conditions. People who received 1 2 doses of PPSV23 before age 81 years should receive another dose of PPSV23 vaccine at age 89 years or later if at least 5 years have passed since the previous dose. Doses of PPSV23 are not needed for people immunized with PPSV23 at or after age 61 years.  Meningococcal vaccine. Adults with asplenia or persistent complement component deficiencies should receive 2 doses of quadrivalent meningococcal conjugate (MenACWY-D) vaccine. The doses should be obtained at least 2 months apart. Microbiologists working with certain meningococcal bacteria, [employer] recruits, people at risk during an outbreak, and people who travel to or live in countries with a high rate of meningitis should be immunized. A first-year college student up through age 63 years who is living in a residence hall should receive a dose if she did not receive a dose on or after her 16th birthday. Adults who have certain high-risk conditions should receive one or more doses of vaccine.  Hepatitis A vaccine. Adults who wish to be protected from this  disease, have certain high-risk conditions, work with hepatitis A-infected animals, work in hepatitis A research labs, or travel to or work in countries with a  high rate of hepatitis A should be immunized. Adults who were previously unvaccinated and who anticipate close contact with an international adoptee during the first 60 days after arrival in the [place] from a country with a high rate of hepatitis A should be immunized.  Hepatitis B vaccine. Adults who wish to be protected from this disease, have certain high-risk conditions, may be exposed to blood or other infectious body fluids, are household contacts or sex partners of hepatitis B positive people, are clients or workers in certain care facilities, or travel to or work in countries with a high rate of hepatitis B should be immunized.  Haemophilus influenzae type b (Hib) vaccine. A previously unvaccinated person with asplenia or sickle cell disease or having a scheduled splenectomy should receive 1 dose of Hib vaccine. Regardless of previous immunization, a recipient of a hematopoietic stem cell transplant should receive a 3-dose series 6 12 months after her successful transplant. Hib vaccine is not recommended for adults with HIV infection. Preventive Services / Frequency Ages 13 to 78  Blood pressure check.** / Every 1 to 2 years.  Lipid and cholesterol check.** / Every 5 years beginning at age 86.  Clinical breast exam.** / Every 3 years for women in their 90s and 76s.  BRCA-related cancer risk assessment.** / For women who have family members with a BRCA-related cancer (breast, ovarian, tubal, or peritoneal cancers).  Pap test.** / Every 2 years from ages 70 through 43. Every 3 years starting at age 36 through age 73 or 102 with a history of 3 consecutive normal Pap tests.  HPV screening.** / Every 3 years from ages 107 through ages 31 to 66 with a history of 3 consecutive normal Pap tests.  Hepatitis C blood test.** / For any  individual with known risks for hepatitis C.  Skin self-exam. / Monthly.  Influenza vaccine. / Every year.  Tetanus, diphtheria, and acellular pertussis (Tdap, Td) vaccine.** / Consult your caregiver. Pregnant women should receive 1 dose of Tdap vaccine during each pregnancy. 1 dose of Td every 10 years.  Varicella vaccine.** / Consult your caregiver. Pregnant females who do not have evidence of immunity should receive the first dose after pregnancy.  HPV vaccine. / 3 doses over 6 months, if 78 and younger. The vaccine is not recommended for use in pregnant females. However, pregnancy testing is not needed before receiving a dose.  Measles, mumps, rubella (MMR) vaccine.** / You need at least 1 dose of MMR if you were born in 1957 or later. You may also need a 2nd dose. For females of childbearing age, rubella immunity should be determined. If there is no evidence of immunity, females who are not pregnant should be vaccinated. If there is no evidence of immunity, females who are pregnant should delay immunization until after pregnancy.  Pneumococcal 13-valent conjugate (PCV13) vaccine.** / Consult your caregiver.  Pneumococcal polysaccharide (PPSV23) vaccine.** / 1 to 2 doses if you smoke cigarettes or if you have certain conditions.  Meningococcal vaccine.** / 1 dose if you are age 69 to 52 years and a Market researcher living in a residence hall, or have one of several medical conditions, you need to get vaccinated against meningococcal disease. You may also need additional booster doses.  Hepatitis A vaccine.** / Consult your caregiver.  Hepatitis B vaccine.** / Consult your caregiver.  Haemophilus influenzae type b (Hib) vaccine.** / Consult your caregiver. Ages 82 to 11  Blood pressure check.** / Every 1 to 2  years.  Lipid and cholesterol check.** / Every 5 years beginning at age 69.  Lung cancer screening. / Every year if you are aged 56 80 years and have a 30-pack-year  history of smoking and currently smoke or have quit within the past 15 years. Yearly screening is stopped once you have quit smoking for at least 15 years or develop a health problem that would prevent you from having lung cancer treatment.  Clinical breast exam.** / Every year after age 22.  BRCA-related cancer risk assessment.** / For women who have family members with a BRCA-related cancer (breast, ovarian, tubal, or peritoneal cancers).  Mammogram.** / Every year beginning at age 2 and continuing for as long as you are in good health. Consult with your caregiver.  Pap test.** / Every 3 years starting at age 104 through age 37 or 52 with a history of 3 consecutive normal Pap tests.  HPV screening.** / Every 3 years from ages 71 through ages 51 to 44 with a history of 3 consecutive normal Pap tests.  Fecal occult blood test (FOBT) of stool. / Every year beginning at age 24 and continuing until age 84. You may not need to do this test if you get a colonoscopy every 10 years.  Flexible sigmoidoscopy or colonoscopy.** / Every 5 years for a flexible sigmoidoscopy or every 10 years for a colonoscopy beginning at age 80 and continuing until age 13.  Hepatitis C blood test.** / For all people born from [birth year] through 1964 and any individual with known risks for hepatitis C.  Skin self-exam. / Monthly.  Influenza vaccine. / Every year.  Tetanus, diphtheria, and acellular pertussis (Tdap/Td) vaccine.** / Consult your caregiver. Pregnant women should receive 1 dose of Tdap vaccine during each pregnancy. 1 dose of Td every 10 years.  Varicella vaccine.** / Consult your caregiver. Pregnant females who do not have evidence of immunity should receive the first dose after pregnancy.  Zoster vaccine.** / 1 dose for adults aged 43 years or older.  Measles, mumps, rubella (MMR) vaccine.** / You need at least 1 dose of MMR if you were born in 1957 or later. You may also need a 2nd dose. For females of  childbearing age, rubella immunity should be determined. If there is no evidence of immunity, females who are not pregnant should be vaccinated. If there is no evidence of immunity, females who are pregnant should delay immunization until after pregnancy.  Pneumococcal 13-valent conjugate (PCV13) vaccine.** / Consult your caregiver.  Pneumococcal polysaccharide (PPSV23) vaccine.** / 1 to 2 doses if you smoke cigarettes or if you have certain conditions.  Meningococcal vaccine.** / Consult your caregiver.  Hepatitis A vaccine.** / Consult your caregiver.  Hepatitis B vaccine.** / Consult your caregiver.  Haemophilus influenzae type b (Hib) vaccine.** / Consult your caregiver. Ages 69 and over  Blood pressure check.** / Every 1 to 2 years.  Lipid and cholesterol check.** / Every 5 years beginning at age 56.  Lung cancer screening. / Every year if you are aged 77 80 years and have a 30-pack-year history of smoking and currently smoke or have quit within the past 15 years. Yearly screening is stopped once you have quit smoking for at least 15 years or develop a health problem that would prevent you from having lung cancer treatment.  Clinical breast exam.** / Every year after age 61.  BRCA-related cancer risk assessment.** / For women who have family members with a BRCA-related cancer (breast, ovarian, tubal, or  peritoneal cancers).  Mammogram.** / Every year beginning at age 33 and continuing for as long as you are in good health. Consult with your caregiver.  Pap test.** / Every 3 years starting at age 25 through age 49 or 71 with a 3 consecutive normal Pap tests. Testing can be stopped between 65 and 70 with 3 consecutive normal Pap tests and no abnormal Pap or HPV tests in the past 10 years.  HPV screening.** / Every 3 years from ages 13 through ages 66 or 28 with a history of 3 consecutive normal Pap tests. Testing can be stopped between 65 and 70 with 3 consecutive normal Pap tests  and no abnormal Pap or HPV tests in the past 10 years.  Fecal occult blood test (FOBT) of stool. / Every year beginning at age 62 and continuing until age 9. You may not need to do this test if you get a colonoscopy every 10 years.  Flexible sigmoidoscopy or colonoscopy.** / Every 5 years for a flexible sigmoidoscopy or every 10 years for a colonoscopy beginning at age 66 and continuing until age 45.  Hepatitis C blood test.** / For all people born from [birth year] through 1964 and any individual with known risks for hepatitis C.  Osteoporosis screening.** / A one-time screening for women ages 68 and over and women at risk for fractures or osteoporosis.  Skin self-exam. / Monthly.  Influenza vaccine. / Every year.  Tetanus, diphtheria, and acellular pertussis (Tdap/Td) vaccine.** / 1 dose of Td every 10 years.  Varicella vaccine.** / Consult your caregiver.  Zoster vaccine.** / 1 dose for adults aged 68 years or older.  Pneumococcal 13-valent conjugate (PCV13) vaccine.** / Consult your caregiver.  Pneumococcal polysaccharide (PPSV23) vaccine.** / 1 dose for all adults aged 90 years and older.  Meningococcal vaccine.** / Consult your caregiver.  Hepatitis A vaccine.** / Consult your caregiver.  Hepatitis B vaccine.** / Consult your caregiver.  Haemophilus influenzae type b (Hib) vaccine.** / Consult your caregiver. ** Family history and personal history of risk and conditions may change your caregiver's recommendations. Document Released: 07/29/2001 Document Revised: 09/27/2012 Document Reviewed: 10/28/2010 [facility] Patient Information 2014 [place], [place].  Hypertension As your heart beats, it forces blood through your arteries. This force is your blood pressure. If the pressure is too high, it is called hypertension (HTN) or high blood pressure. HTN is dangerous because you may have it and not know it. High blood pressure may mean that your heart has to work harder to pump blood. Your  arteries may be narrow or stiff. The extra work puts you at risk for heart disease, stroke, and other problems.  Blood pressure consists of two numbers, a higher number over a lower, 110/72, for example. It is stated as "110 over 72." The ideal is below 120 for the top number (systolic) and under 80 for the bottom (diastolic). Write down your blood pressure today. You should pay close attention to your blood pressure if you have certain conditions such as:  Heart failure.  Prior heart attack.  Diabetes  Chronic kidney disease.  Prior stroke.  Multiple risk factors for heart disease. To see if you have HTN, your blood pressure should be measured while you are seated with your arm held at the level of the heart. It should be measured at least twice. A one-time elevated blood pressure reading (especially in the Emergency Department) does not mean that you need treatment. There may be conditions in which the blood pressure is  different between your right and left arms. It is important to see your caregiver soon for a recheck. Most people have essential hypertension which means that there is not a specific cause. This type of high blood pressure may be lowered by changing lifestyle factors such as:  Stress.  Smoking.  Lack of exercise.  Excessive weight.  Drug/tobacco/alcohol use.  Eating less salt. Most people do not have symptoms from high blood pressure until it has caused damage to the body. Effective treatment can often prevent, delay or reduce that damage. TREATMENT  When a cause has been identified, treatment for high blood pressure is directed at the cause. There are a large number of medications to treat HTN. These fall into several categories, and your caregiver will help you select the medicines that are best for you. Medications may have side effects. You should review side effects with your caregiver. If your blood pressure stays high after you have made lifestyle changes or  started on medicines,   Your medication(s) may need to be changed.  Other problems may need to be addressed.  Be certain you understand your prescriptions, and know how and when to take your medicine.  Be sure to follow up with your caregiver within the time frame advised (usually within two weeks) to have your blood pressure rechecked and to review your medications.  If you are taking more than one medicine to lower your blood pressure, make sure you know how and at what times they should be taken. Taking two medicines at the same time can result in blood pressure that is too low. SEEK IMMEDIATE MEDICAL CARE IF:  You develop a severe headache, blurred or changing vision, or confusion.  You have unusual weakness or numbness, or a faint feeling.  You have severe chest or abdominal pain, vomiting, or breathing problems. MAKE SURE YOU:   Understand these instructions.  Will watch your condition.  Will get help right away if you are not doing well or get worse. Document Released: 06/02/2005 Document Revised: 08/25/2011 Document Reviewed: 01/21/2008 [facility] Patient Information 2014 [place].

## 2013-06-22 NOTE — Assessment & Plan Note (Signed)
Flu shot giving by nursing staff.  Prevnar given by nursing staff.  Patient to return for TDaP.  Patient to return for Medicare Wellness Visit and fasting labs.  Will check on pricing of Zostavax with her insurances.

## 2013-06-22 NOTE — Assessment & Plan Note (Signed)
BP good in clinic.  Asymptomatic.  Will refill medications.

## 2013-06-22 NOTE — Assessment & Plan Note (Signed)
Asymptomatic. 

## 2013-06-23 ENCOUNTER — Telehealth: Payer: Self-pay | Admitting: Physician Assistant

## 2013-06-23 NOTE — Telephone Encounter (Signed)
Relevant patient education assigned to patient using Emmi. ° °

## 2013-06-30 ENCOUNTER — Encounter: Payer: Self-pay | Admitting: Physician Assistant

## 2013-06-30 ENCOUNTER — Ambulatory Visit (INDEPENDENT_AMBULATORY_CARE_PROVIDER_SITE_OTHER): Payer: Medicare Other | Admitting: Physician Assistant

## 2013-06-30 ENCOUNTER — Encounter: Payer: Self-pay | Admitting: Internal Medicine

## 2013-06-30 ENCOUNTER — Other Ambulatory Visit: Payer: Self-pay | Admitting: Physician Assistant

## 2013-06-30 VITALS — BP 118/72 | HR 71 | Temp 97.8°F | Resp 16 | Ht 68.0 in | Wt 253.0 lb

## 2013-06-30 DIAGNOSIS — Z23 Encounter for immunization: Secondary | ICD-10-CM

## 2013-06-30 DIAGNOSIS — I10 Essential (primary) hypertension: Secondary | ICD-10-CM

## 2013-06-30 DIAGNOSIS — Z1211 Encounter for screening for malignant neoplasm of colon: Secondary | ICD-10-CM

## 2013-06-30 DIAGNOSIS — Z0000 Encounter for general adult medical examination without abnormal findings: Secondary | ICD-10-CM

## 2013-06-30 DIAGNOSIS — R6889 Other general symptoms and signs: Secondary | ICD-10-CM

## 2013-06-30 DIAGNOSIS — K219 Gastro-esophageal reflux disease without esophagitis: Secondary | ICD-10-CM

## 2013-06-30 DIAGNOSIS — R899 Unspecified abnormal finding in specimens from other organs, systems and tissues: Secondary | ICD-10-CM

## 2013-06-30 LAB — COMPREHENSIVE METABOLIC PANEL
ALK PHOS: 80 U/L (ref 39–117)
ALT: 13 U/L (ref 0–35)
AST: 17 U/L (ref 0–37)
Albumin: 3.9 g/dL (ref 3.5–5.2)
BILIRUBIN TOTAL: 0.5 mg/dL (ref 0.3–1.2)
BUN: 11 mg/dL (ref 6–23)
CO2: 32 meq/L (ref 19–32)
CREATININE: 0.61 mg/dL (ref 0.50–1.10)
Calcium: 9.4 mg/dL (ref 8.4–10.5)
Chloride: 104 mEq/L (ref 96–112)
GLUCOSE: 98 mg/dL (ref 70–99)
Potassium: 4.5 mEq/L (ref 3.5–5.3)
SODIUM: 141 meq/L (ref 135–145)
TOTAL PROTEIN: 6.7 g/dL (ref 6.0–8.3)

## 2013-06-30 LAB — CBC WITH DIFFERENTIAL/PLATELET
Basophils Absolute: 0 10*3/uL (ref 0.0–0.1)
Basophils Relative: 0 % (ref 0–1)
Eosinophils Absolute: 0.1 10*3/uL (ref 0.0–0.7)
Eosinophils Relative: 2 % (ref 0–5)
HEMATOCRIT: 37.9 % (ref 36.0–46.0)
Hemoglobin: 12.7 g/dL (ref 12.0–15.0)
LYMPHS PCT: 29 % (ref 12–46)
Lymphs Abs: 1.5 10*3/uL (ref 0.7–4.0)
MCH: 32.2 pg (ref 26.0–34.0)
MCHC: 33.5 g/dL (ref 30.0–36.0)
MCV: 95.9 fL (ref 78.0–100.0)
Monocytes Absolute: 0.4 10*3/uL (ref 0.1–1.0)
Monocytes Relative: 8 % (ref 3–12)
NEUTROS ABS: 3.2 10*3/uL (ref 1.7–7.7)
Neutrophils Relative %: 61 % (ref 43–77)
Platelets: 205 10*3/uL (ref 150–400)
RBC: 3.95 MIL/uL (ref 3.87–5.11)
RDW: 13.3 % (ref 11.5–15.5)
WBC: 5.2 10*3/uL (ref 4.0–10.5)

## 2013-06-30 LAB — LIPID PANEL
Cholesterol: 201 mg/dL — ABNORMAL HIGH (ref 0–200)
HDL: 44 mg/dL (ref >39)
LDL Cholesterol: 120 mg/dL — ABNORMAL HIGH (ref 0–99)
TRIGLYCERIDES: 183 mg/dL — AB (ref <150)
Total CHOL/HDL Ratio: 4.6 Ratio
VLDL: 37 mg/dL (ref 0–40)

## 2013-06-30 NOTE — Progress Notes (Signed)
Patient ID: Linda [name], female   DOB: 1946-06-04, 68 y.o.   MRN: [id number]  Subjective:   Patient here for Medicare annual wellness visit and management of other chronic and acute problems.   Patient with no acute concerns at today's visit.  Hypertension -- Patient diagnosed by a vascular specialist. Currently on carvedilol BID and Hyzaar QD. Endorses good control of BP. BP in clinic today is good.. Denies headache, chest pain, palpitations. SOB, LH, dizziness or vision changes.   Breast Cancer -- Patient diagnosed with bilateral breast cancer in 2013. Is followed by Micah Flesher PA-C and Dr. Marjie Skiff. Endorses cancer is in remission. Patient is s/p double mastectomy. Endorses having BRCA testing which was negative. Positive family history of breat cancer including patient's mother and sister.   GERD -- patient asymptomatic.   Seasonal allergies -- occasional claritin or zyrtec PRN seasonally  Health Maintenance:  Dental -- upcoming appointment  Vision -- UTD  Immunizations -- Flu shot given. Pneumonia shot. Overdue Tetanus shot.  Colonoscopy -- Patient stated last colonoscopy was in 2013.  Patient reveals today that she actually never had a full colonoscopy. Will need a referral to GI. Mammogram -- UTD; s/p double mastectomy for bilateral breast cancer.  PAP -- Denies hx of abnormal PAP.  Bone Density -- 04/2012   Risk factors: HTN, Age > 11  Roster of Physicians Providing Medical Care to Patient: Dr. Marjie Skiff -- Oncology [facility], [facility] -- Primary Care  Activities of Daily Living  In your present state of health, do you have any difficulty performing the following activities? Preparing food and eating?: No  Bathing yourself: No  Getting dressed: No  Using the toilet:No  Moving around from place to place: No  In the past year have you fallen or had a near fall?:No     Home Safety: Has smoke detector and wears seat belts. No firearms. No excess sun exposure.  Diet and  Exercise  Current exercise habits: walking daily. Dietary issues discussed: healthy diet   Depression Screen  (Note: if answer to either of the following is "Yes", then a more complete depression screening is indicated)  Q1: Over the past two weeks, have you felt down, depressed or hopeless? no  Q2: Over the past two weeks, have you felt little interest or pleasure in doing things? no   The following portions of the patient's history were reviewed and updated as appropriate: allergies, current medications, past family history, past medical history, past social history, past surgical history and problem list.    Review of Systems  Review of Systems  Constitutional: Negative for fever, weight loss and malaise/fatigue.  HENT: Negative for ear discharge, ear pain, hearing loss and tinnitus.   Eyes: Negative for blurred vision, double vision, photophobia and pain.  Respiratory: Negative for cough, shortness of breath and wheezing.   Cardiovascular: Negative for chest pain and palpitations.  Gastrointestinal: Negative for heartburn, nausea, vomiting, abdominal pain, diarrhea, constipation, blood in stool and melena.  Genitourinary: Negative for dysuria, urgency, frequency, hematuria and flank pain.  Musculoskeletal: Negative for myalgias.  Neurological: Negative for dizziness, seizures, loss of consciousness and headaches.  Endo/Heme/Allergies: Positive for environmental allergies.  Psychiatric/Behavioral: Negative for depression, suicidal ideas, hallucinations and substance abuse. The patient is not nervous/anxious.    Objective:   Vision: vision uncorrected within normal limits. Hearing: Patient is able to hear a forced whipser from across the room Body mass index: Body mass index is 38.48 kg/(m^2). Cognitive Impairment Assessment: cognition, memory  and judgment appear normal.   Physical Exam  Vitals reviewed. Constitutional: She is oriented to person, place, and time and  well-developed, well-nourished, and in no distress.  HENT:  Head: Normocephalic and atraumatic.  Right Ear: Tympanic membrane and external ear normal.  Left Ear: Tympanic membrane and external ear normal.  Nose: Nose normal.  Mouth/Throat: Uvula is midline, oropharynx is clear and moist and mucous membranes are normal. No oropharyngeal exudate, posterior oropharyngeal edema, posterior oropharyngeal erythema or tonsillar abscesses.  Eyes: Conjunctivae are normal. Pupils are equal, round, and reactive to light. Right eye exhibits no discharge. Left eye exhibits no discharge. No scleral icterus.  Neck: Neck supple. No thyromegaly present.  Cardiovascular: Normal rate, regular rhythm and normal heart sounds.   Pulmonary/Chest: Effort normal and breath sounds normal. No respiratory distress. She has no wheezes. She has no rales. She exhibits no tenderness.  Abdominal: Soft. Bowel sounds are normal. She exhibits no distension and no mass. There is no tenderness. There is no rebound and no guarding.  Lymphadenopathy:    She has no cervical adenopathy.  Neurological: She is alert and oriented to person, place, and time. No cranial nerve deficit.  Skin: Skin is warm and dry. No rash noted.  Psychiatric: Affect normal.    Assessment:   Medicare wellness utd on preventive parameters  (1)Hypertension -- continue current regimen (2) Breast Cancer -- continue care and follow-up as directed by Oncology (3) GERD -- well controlled at present time (4) Will make referral to GI for colonoscopy.  Plan:    (1)Hypertension -- continue current regimen (2) Breast Cancer -- continue care and follow-up as directed by Oncology (3) GERD -- well controlled at present time (4) Will make referral to GI for colonoscopy.  During the course of the visit the patient was educated and counseled about appropriate screening and preventive services including:       Fall prevention   Screening mammography  Bone  densitometry screening  Diabetes screening  Nutrition counseling   Patient Instructions (the written plan) was given to the patient.

## 2013-06-30 NOTE — Patient Instructions (Signed)
Please obtain labs.  I will call you with your results.  You will be contacted to set up a consultation for a colonoscopy.  Please follow-up with your oncologist.

## 2013-07-01 ENCOUNTER — Telehealth: Payer: Self-pay | Admitting: Physician Assistant

## 2013-07-01 DIAGNOSIS — R8290 Unspecified abnormal findings in urine: Secondary | ICD-10-CM

## 2013-07-01 DIAGNOSIS — N390 Urinary tract infection, site not specified: Secondary | ICD-10-CM

## 2013-07-01 LAB — URINALYSIS, ROUTINE W REFLEX MICROSCOPIC
Bilirubin Urine: NEGATIVE
Glucose, UA: NEGATIVE mg/dL
Hgb urine dipstick: NEGATIVE
KETONES UR: NEGATIVE mg/dL
Nitrite: NEGATIVE
Protein, ur: NEGATIVE mg/dL
SPECIFIC GRAVITY, URINE: 1.016 (ref 1.005–1.030)
UROBILINOGEN UA: 0.2 mg/dL (ref 0.0–1.0)
pH: 6.5 (ref 5.0–8.0)

## 2013-07-01 LAB — URINALYSIS, MICROSCOPIC ONLY
CASTS: NONE SEEN
CRYSTALS: NONE SEEN

## 2013-07-01 MED ORDER — SULFAMETHOXAZOLE-TMP DS 800-160 MG PO TABS: 1.0000 | ORAL_TABLET | Freq: Two times a day (BID) | ORAL | Status: DC

## 2013-07-01 NOTE — Telephone Encounter (Signed)
LMOM with contact name and number for return call RE: results and further provider instructions, informed that ABX has been sent to pharmacy for UTI; called [facility] and added Urine culture to 01.15.15 lab order/SLS

## 2013-07-01 NOTE — Telephone Encounter (Signed)
Lab results are in.  Overall look good.  LDL cholesterol is just mildly elevated -- monitor intake of foods high in cholesterol and saturated fats. Get in some exercise daily.  Also urine shows evidence of infection.  Will send in Rx for Bactrim twice daily for 3 days. (Order pending so patient can select pharmacy)  Increase fluid intake and take a cranberry supplement. We will call her with results of her urine culture and change antibiotic if necessary.  We need to place an add-on order for a urine culture for patient with [facility].

## 2013-07-03 DIAGNOSIS — Z0000 Encounter for general adult medical examination without abnormal findings: Secondary | ICD-10-CM | POA: Insufficient documentation

## 2013-07-03 LAB — URINE CULTURE: Colony Count: >=100000

## 2013-07-03 NOTE — Assessment & Plan Note (Signed)
Continue current regimen

## 2013-07-03 NOTE — Assessment & Plan Note (Signed)
Will obtain labs.  History reviewed and updated.  Referral to GI for colonoscopy placed.

## 2013-07-06 NOTE — Telephone Encounter (Signed)
Notified pt per verbal from Provider that culture grew group B strep. Complete abx and if symptoms continue or worsen to let us know.  Pt states she was not having symptoms to begin with.  Should pt return for repeat culture in a couple of weeks to make sure infection has cleared?

## 2013-07-06 NOTE — Telephone Encounter (Signed)
Left message for patient to return my call.

## 2013-07-06 NOTE — Telephone Encounter (Signed)
Please see Cody's note and schedule appt.  Thanks

## 2013-07-06 NOTE — Telephone Encounter (Signed)
Lab order entered.

## 2013-07-06 NOTE — Telephone Encounter (Signed)
I would recommend her come back in 1-2 weeks just to give [place] a urine sample.  Thank you.

## 2013-07-06 NOTE — Telephone Encounter (Signed)
Pt left message requesting results of urine culture. See final result on 06/30/13.  Please advise.

## 2013-07-18 NOTE — Telephone Encounter (Signed)
Left message for patient to return my call.

## 2013-07-21 NOTE — Telephone Encounter (Signed)
Left message for patient to return my call.

## 2013-08-08 ENCOUNTER — Ambulatory Visit (AMBULATORY_SURGERY_CENTER): Payer: Self-pay | Admitting: *Deleted

## 2013-08-08 VITALS — Ht 68.0 in | Wt 249.0 lb

## 2013-08-08 DIAGNOSIS — Z1211 Encounter for screening for malignant neoplasm of colon: Secondary | ICD-10-CM

## 2013-08-08 MED ORDER — MOVIPREP 100 G PO SOLR: 1.0000 | Freq: Once | ORAL | Status: DC

## 2013-08-08 NOTE — Progress Notes (Signed)
Per pt do Iv on the right. No bp/stick on the left. ewm No egg or soy allergy. ewm No home 02 or cpap use. ewm No problems with past sedation. ewm Pt declined emmi video. ewm

## 2013-08-22 ENCOUNTER — Encounter: Payer: Self-pay | Admitting: Internal Medicine

## 2013-09-26 ENCOUNTER — Other Ambulatory Visit (HOSPITAL_BASED_OUTPATIENT_CLINIC_OR_DEPARTMENT_OTHER): Payer: Medicare Other

## 2013-09-26 DIAGNOSIS — C50519 Malignant neoplasm of lower-outer quadrant of unspecified female breast: Secondary | ICD-10-CM

## 2013-09-26 DIAGNOSIS — C50112 Malignant neoplasm of central portion of left female breast: Secondary | ICD-10-CM

## 2013-09-26 LAB — CBC WITH DIFFERENTIAL/PLATELET
BASO%: 0.4 % (ref 0.0–2.0)
Basophils Absolute: 0 10*3/uL (ref 0.0–0.1)
EOS%: 3.1 % (ref 0.0–7.0)
Eosinophils Absolute: 0.2 10*3/uL (ref 0.0–0.5)
HEMATOCRIT: 35.8 % (ref 34.8–46.6)
HGB: 12.1 g/dL (ref 11.6–15.9)
LYMPH%: 27.4 % (ref 14.0–49.7)
MCH: 31.8 pg (ref 25.1–34.0)
MCHC: 33.9 g/dL (ref 31.5–36.0)
MCV: 93.8 fL (ref 79.5–101.0)
MONO#: 0.4 10*3/uL (ref 0.1–0.9)
MONO%: 7.9 % (ref 0.0–14.0)
NEUT#: 3.1 10*3/uL (ref 1.5–6.5)
NEUT%: 61.2 % (ref 38.4–76.8)
PLATELETS: 202 10*3/uL (ref 145–400)
RBC: 3.82 10*6/uL (ref 3.70–5.45)
RDW: 13.2 % (ref 11.2–14.5)
WBC: 5.1 10*3/uL (ref 3.9–10.3)
lymph#: 1.4 10*3/uL (ref 0.9–3.3)

## 2013-09-26 LAB — COMPREHENSIVE METABOLIC PANEL (CC13)
ALK PHOS: 100 U/L (ref 40–150)
ALT: 9 U/L (ref 0–55)
ANION GAP: 8 meq/L (ref 3–11)
AST: 14 U/L (ref 5–34)
Albumin: 3.4 g/dL — ABNORMAL LOW (ref 3.5–5.0)
BILIRUBIN TOTAL: 0.31 mg/dL (ref 0.20–1.20)
BUN: 11.1 mg/dL (ref 7.0–26.0)
CO2: 27 meq/L (ref 22–29)
CREATININE: 0.7 mg/dL (ref 0.6–1.1)
Calcium: 9.4 mg/dL (ref 8.4–10.4)
Chloride: 109 mEq/L (ref 98–109)
Glucose: 138 mg/dl (ref 70–140)
Potassium: 3.5 mEq/L (ref 3.5–5.1)
SODIUM: 144 meq/L (ref 136–145)
TOTAL PROTEIN: 6.5 g/dL (ref 6.4–8.3)

## 2013-10-03 ENCOUNTER — Ambulatory Visit (HOSPITAL_BASED_OUTPATIENT_CLINIC_OR_DEPARTMENT_OTHER): Payer: Medicare Other | Admitting: Oncology

## 2013-10-03 VITALS — BP 157/84 | HR 89 | Temp 98.7°F | Resp 18 | Ht 68.0 in | Wt 253.2 lb

## 2013-10-03 DIAGNOSIS — C50519 Malignant neoplasm of lower-outer quadrant of unspecified female breast: Secondary | ICD-10-CM

## 2013-10-03 DIAGNOSIS — C50119 Malignant neoplasm of central portion of unspecified female breast: Secondary | ICD-10-CM

## 2013-10-03 DIAGNOSIS — Z170 Estrogen receptor positive status [ER+]: Secondary | ICD-10-CM

## 2013-10-03 DIAGNOSIS — C773 Secondary and unspecified malignant neoplasm of axilla and upper limb lymph nodes: Secondary | ICD-10-CM

## 2013-10-03 DIAGNOSIS — C50919 Malignant neoplasm of unspecified site of unspecified female breast: Secondary | ICD-10-CM

## 2013-10-03 MED ORDER — ANASTROZOLE 1 MG PO TABS: 1.0000 mg | ORAL_TABLET | Freq: Every day | ORAL | Status: DC

## 2013-10-03 NOTE — Progress Notes (Signed)
ID: Linda [name]   DOB: Oct 21, 1945  MR#: [id number]  JIR#:[id number]  PCP: Leeanne Rio, PA-C GYN: SU: Coralie Keens OTHER MD: Crissie Reese, Linna Hoff Bensimhon  CHIEF COMPLAINT:  Bilateral Breast Cancers   HISTORY OF PRESENT ILLNESS: The patient noted a small amount of drainage from her left breast December of 2012. She brought it to her gynecologist's attention in January of 2013 and was set up for bilateral diagnostic mammography at the [facility] Center July 24, 2011. This was the patient's first ever mammogram. It showed a large irregular mass in the left retroareolar region extending to the nipple, with nipple retraction and skin thickening. This measured approximately 8.4 cm. It was associated with pleomorphic microcalcifications. By exam there was moderate distortion and retraction of the nipple with a large palpable ill-defined area in the retroareolar region. In the right right breast there was also a 2 cm hard mass palpated. Ultrasound of the right breast mass showed a complex cystic/solid area measuring 1.9 cm. Ultrasound of the right axilla was negative. Ultrasound of the left breast showed a large hypoechoic mass measuring at least 3.8 cm. The left axilla showed several adjacent abnormal appearing lymph nodes.  With this information biopsy of the right and left breast masses were obtained the same day, and showed ([id number])  (a) on the right, and invasive ductal carcinoma with papillary features, estrogen and progesterone receptor positive, HER-2 negative, with an MIB-1 of 10%.  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 The MIB-1 was 60% for the left-sided tumor.  With this information the patient was presented at the multidisciplinary breast cancer conference 08/06/2011. Subsequent evaluation and treatments are as detailed below.  INTERVAL HISTORY: Linda [name] returns today for  followup of her bilateral breast cancers. The interval history is unremarkable except that she had an you've granddaughter this past winter. She continues on anastrozole with no hot flashes, vaginal dryness, or arthralgias/myalgias.  REVIEW OF SYSTEMS: Linda [name] walks almost everyday and of course she also walks at work. She does not otherwise exercise. Her left chest wall sometimes feels a little tight and had reminds her to do her stretching exercises. A detailed review of systems today was otherwise entirely negative.  PAST MEDICAL HISTORY: Past Medical History  Diagnosis Date  . Cancer 08-13-11    07-31-11-Dx. Bilarteral Breast cancer-left greater than rt.  . Hematuria, undiagnosed cause 08-13-11    Being evaluated by [facility] 08-14-11  . Hypertension   . Bronchitis     hx  . GERD (gastroesophageal reflux disease)     doing well  . 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 Seroma 02/04/12    right breast  200cc removed  erythema on right side  . Seasonal allergies   . History of radiation therapy 02/20/12-04/15/12    left breast,total 60.4 Gy  . Wears partial dentures     wears upper and lower partial  . Allergy   . Blood transfusion without reported diagnosis     PAST SURGICAL HISTORY: Past Surgical History  Procedure Laterality Date  . Child birth  08-13-11    x3 -NVD  . Portacath placement  08/15/2011    Procedure: INSERTION PORT-A-CATH;  Surgeon: Harl Bowie, MD;  Location: [facility];  Service: General;  Laterality: N/A;  .  Mastectomy w/ sentinel node biopsy  01/21/2012    Procedure: MASTECTOMY WITH SENTINEL LYMPH NODE BIOPSY;  Surgeon: Harl Bowie, MD;  Location: [place];  Service: General;  Laterality: Bilateral;  Left modified radical mastectomy, Rt mastectomy with Sentinel lymphnode biospy  . Breast surgery    . Port-a-cath removal Right  12/22/2012    Procedure: REMOVAL PORT-A-CATH;  Surgeon: Harl Bowie, MD;  Location: [place];  Service: General;  Laterality: Right;    FAMILY HISTORY Family History  Problem Relation Age of Onset  . Heart disease Mother   . Cancer Mother 59    breast, , 110 deceased  . Heart attack Father   . Cancer Sister 45    breast  . Colon cancer Neg Hx   The patient's father died from a heart attack at the age of 47. The patient's mother died from apparently heart problems at the age of 42. The patient had no brothers. She has 3 sisters. One of her sisters was diagnosed with breast cancer in her mid 38s. The patient does not know if his sister was ever genetically tested. The patient's mother had a mastectomy at the age of 40, presumably for breast cancer. There is no other history of breast or in cancer in the family to her knowledge.   GYNECOLOGIC HISTORY:  She does not recall when she had menarche. She had her first child at age 74. She is GX P3. She underwent menopause in her mid 63s. She never took hormone replacement.   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 She moved to [place] about 2 years ago but has a Games developer in [place]. Son Gerald Stabs lives in [place] and works as a Airline pilot. His wife is a Marine scientist. Son Shanon Brow lives at [place] and is a Advertising account executive in addition to having the [employer]. The patient attends a local [employer] here   ADVANCED DIRECTIVES: Not in place  HEALTH MAINTENANCE: History  Substance Use Topics  . Smoking status: Never Smoker   . Smokeless tobacco: Never Used  . Alcohol Use: Yes     Comment: rare- occ.     Colonoscopy: Never  PAP: Feb 2013  Bone density: November 2013, normal  Lipid panel:   Allergies  Allergen Reactions  . Ace Inhibitors Cough    Current Outpatient Prescriptions   Medication Sig Dispense Refill  . anastrozole (ARIMIDEX) 1 MG tablet Take 1 tablet (1 mg total) by mouth daily.  30 tablet  12  . b complex vitamins tablet Take 1 tablet by mouth daily.      . carvedilol (COREG) 3.125 MG tablet TAKE 1 TABLET BY MOUTH TWICE DAILY WITH A MEAL  60 tablet  0  . MOVIPREP 100 G SOLR Take 1 kit (200 g total) by mouth once. moviprep as directed. No substitutions  1 kit  0  . Multiple Vitamins-Iron (MULTIVITAMINS WITH IRON) TABS Take 1 tablet by mouth daily.      [name] POTASSIUM PO Take by mouth daily as needed.      . vitamin C (ASCORBIC ACID) 500 MG tablet Take 500 mg by mouth daily.       No current facility-administered medications for this visit.    OBJECTIVE: [name] in no acute distress Filed Vitals:   10/03/13 1001  BP: 157/84  Pulse: 89  Temp: 98.7 F (37.1 C)  Resp: 18     Body mass index is 38.51 kg/(m^2).    ECOG FS: 0 Filed Weights   10/03/13 1001  Weight: 253 lb 3.2 oz (114.851 kg)   Sclerae unicteric, pupils round and equal Oropharynx clear and moist No cervical or supraclavicular adenopathy Lungs no rales or rhonchi Heart regular rate and rhythm Abd soft, nontender, positive bowel sounds MSK no focal spinal tenderness, no upper extremity lymphedema Neuro: nonfocal, well oriented, appropriate affect Breasts: Status post bilateral mastectomies. There is no evidence of local recurrence. Both axillae are benign.    LAB RESULTS: Lab Results  Component Value Date   WBC 5.1 09/26/2013   NEUTROABS 3.1 09/26/2013   HGB 12.1 09/26/2013   HCT 35.8 09/26/2013   MCV 93.8 09/26/2013   PLT 202 09/26/2013      Chemistry      Component Value Date/Time   NA 144 09/26/2013 0814   NA 141 06/30/2013 0959   K 3.5 09/26/2013 0814   K 4.5 06/30/2013 0959   CL 104 06/30/2013 0959   CL 107 11/01/2012 1045   CO2 27 09/26/2013 0814   CO2 32 06/30/2013 0959   BUN 11.1 09/26/2013 0814   BUN 11 06/30/2013 0959   CREATININE 0.7 09/26/2013 0814    CREATININE 0.61 06/30/2013 0959   CREATININE 0.70 12/21/2012 1541      Component Value Date/Time   CALCIUM 9.4 09/26/2013 0814   CALCIUM 9.4 06/30/2013 0959   ALKPHOS 100 09/26/2013 0814   ALKPHOS 80 06/30/2013 0959   AST 14 09/26/2013 0814   AST 17 06/30/2013 0959   ALT 9 09/26/2013 0814   ALT 13 06/30/2013 0959   BILITOT 0.31 09/26/2013 0814   BILITOT 0.5 06/30/2013 0959       STUDIES: No results found.  ASSESSMENT: 68 y.o.  [place] woman   (1)  status post bilateral breast biopsies 07/24/2011, showing,      (a) on the right, a clinical T2 N0 papillary/ductal breast cancer, estrogen and progesterone receptor positive, HER-2 negative, with an MIB-1 of 10%;     (b) on the left, a clinical T3 N1, stage IIIA invasive ductal carcinoma, grade 3, triple positive, with an MIB-1 of 60%.  (2)  Status post 4 dose dense cycles of doxorubicin/ cyclophosphamide followed by 4 dose dense cycles of paclitaxel and trastuzumab completed 12/09/2011  (3) the trastuzumab was continued for a total of one year (to 11/01/2012). Final echo on 11/04/2012 showing a well preserved ejection fraction of 55-60%.  (4) s/p bilateral mastectomies 01/21/2012 showing   (a) on the Right, an 8 mm invasive papillary carcinoma, grade 1, ypT1b ypN0   (b) on the Left, multiple microscopic foci of residual  Invasive ductal carcinoma with evidence of dermal lymphatic involvement, pyT1a/T4 pyN0  (5)  Postmastectomy radiation, completed 04/15/2012  (6) Started anastrazole 04/16/2012; normal dexa scan 05/03/2012 at the [facility]   PLAN: Linda [name] is doing terrific as far as her breast cancer is concerned and she is tolerating the anastrozole with no significant side effects. The plan is to continue that. She will have a repeat bone density November of 2015. I will see her shortly after. If the bone density still looks good, likely we will stay on anastrozole a total of 5 years. Otherwise we will switch to tamoxifen.  She has  a good understanding of the overall plan. She agrees with it. She will call with any problems that may  develop before her next visit here.  Chauncey Cruel, MD    10/03/2013

## 2013-10-14 ENCOUNTER — Telehealth: Payer: Self-pay | Admitting: Oncology

## 2013-10-14 NOTE — Telephone Encounter (Signed)
, °

## 2013-10-17 ENCOUNTER — Other Ambulatory Visit: Payer: Self-pay | Admitting: Oncology

## 2013-10-17 DIAGNOSIS — M8580 Other specified disorders of bone density and structure, unspecified site: Secondary | ICD-10-CM

## 2013-10-27 ENCOUNTER — Telehealth: Payer: Self-pay | Admitting: Oncology

## 2014-01-30 ENCOUNTER — Ambulatory Visit (INDEPENDENT_AMBULATORY_CARE_PROVIDER_SITE_OTHER): Payer: Medicare Other | Admitting: Surgery

## 2014-03-01 ENCOUNTER — Ambulatory Visit (INDEPENDENT_AMBULATORY_CARE_PROVIDER_SITE_OTHER): Payer: Medicare Other | Admitting: Surgery

## 2014-03-30 ENCOUNTER — Other Ambulatory Visit (HOSPITAL_COMMUNITY): Payer: Self-pay | Admitting: Adult Health

## 2014-05-05 ENCOUNTER — Other Ambulatory Visit: Payer: Self-pay

## 2014-05-12 ENCOUNTER — Other Ambulatory Visit: Payer: Self-pay | Admitting: *Deleted

## 2014-05-12 DIAGNOSIS — C50119 Malignant neoplasm of central portion of unspecified female breast: Secondary | ICD-10-CM

## 2014-05-15 ENCOUNTER — Other Ambulatory Visit (HOSPITAL_BASED_OUTPATIENT_CLINIC_OR_DEPARTMENT_OTHER): Payer: Medicare Other

## 2014-05-15 DIAGNOSIS — C50119 Malignant neoplasm of central portion of unspecified female breast: Secondary | ICD-10-CM

## 2014-05-15 DIAGNOSIS — C50511 Malignant neoplasm of lower-outer quadrant of right female breast: Secondary | ICD-10-CM

## 2014-05-15 DIAGNOSIS — C50912 Malignant neoplasm of unspecified site of left female breast: Secondary | ICD-10-CM

## 2014-05-15 LAB — CBC WITH DIFFERENTIAL/PLATELET
BASO%: 0.2 % (ref 0.0–2.0)
BASOS ABS: 0 10*3/uL (ref 0.0–0.1)
EOS%: 2.3 % (ref 0.0–7.0)
Eosinophils Absolute: 0.1 10*3/uL (ref 0.0–0.5)
HEMATOCRIT: 35.8 % (ref 34.8–46.6)
HEMOGLOBIN: 11.8 g/dL (ref 11.6–15.9)
LYMPH%: 24.7 % (ref 14.0–49.7)
MCH: 31 pg (ref 25.1–34.0)
MCHC: 33 g/dL (ref 31.5–36.0)
MCV: 94 fL (ref 79.5–101.0)
MONO#: 0.6 10*3/uL (ref 0.1–0.9)
MONO%: 10.6 % (ref 0.0–14.0)
NEUT#: 3.3 10*3/uL (ref 1.5–6.5)
NEUT%: 62.2 % (ref 38.4–76.8)
Platelets: 191 10*3/uL (ref 145–400)
RBC: 3.81 10*6/uL (ref 3.70–5.45)
RDW: 12.8 % (ref 11.2–14.5)
WBC: 5.3 10*3/uL (ref 3.9–10.3)
lymph#: 1.3 10*3/uL (ref 0.9–3.3)

## 2014-05-15 LAB — COMPREHENSIVE METABOLIC PANEL (CC13)
ALT: 8 U/L (ref 0–55)
ANION GAP: 9 meq/L (ref 3–11)
AST: 12 U/L (ref 5–34)
Albumin: 3.3 g/dL — ABNORMAL LOW (ref 3.5–5.0)
Alkaline Phosphatase: 89 U/L (ref 40–150)
BILIRUBIN TOTAL: 0.46 mg/dL (ref 0.20–1.20)
BUN: 10 mg/dL (ref 7.0–26.0)
CHLORIDE: 104 meq/L (ref 98–109)
CO2: 28 meq/L (ref 22–29)
Calcium: 9.6 mg/dL (ref 8.4–10.4)
Creatinine: 0.7 mg/dL (ref 0.6–1.1)
Glucose: 117 mg/dl (ref 70–140)
Potassium: 3.5 mEq/L (ref 3.5–5.1)
Sodium: 142 mEq/L (ref 136–145)
Total Protein: 6.8 g/dL (ref 6.4–8.3)

## 2014-05-18 ENCOUNTER — Other Ambulatory Visit: Payer: Self-pay

## 2014-05-19 ENCOUNTER — Ambulatory Visit
Admission: RE | Admit: 2014-05-19 | Discharge: 2014-05-19 | Disposition: A | Discharge status: Home / Self Care | Payer: Medicare Other | Source: Ambulatory Visit | Attending: Oncology | Admitting: Oncology

## 2014-05-19 DIAGNOSIS — M8580 Other specified disorders of bone density and structure, unspecified site: Secondary | ICD-10-CM

## 2014-05-22 ENCOUNTER — Ambulatory Visit (HOSPITAL_BASED_OUTPATIENT_CLINIC_OR_DEPARTMENT_OTHER): Payer: Medicare Other | Admitting: Oncology

## 2014-05-22 ENCOUNTER — Telehealth: Payer: Self-pay | Admitting: Oncology

## 2014-05-22 VITALS — BP 150/82 | HR 82 | Temp 98.0°F | Resp 19 | Ht 68.0 in | Wt 245.8 lb

## 2014-05-22 DIAGNOSIS — Z853 Personal history of malignant neoplasm of breast: Secondary | ICD-10-CM

## 2014-05-22 DIAGNOSIS — Z79811 Long term (current) use of aromatase inhibitors: Secondary | ICD-10-CM

## 2014-05-22 DIAGNOSIS — C50112 Malignant neoplasm of central portion of left female breast: Secondary | ICD-10-CM

## 2014-05-22 NOTE — Progress Notes (Signed)
ID: ARAH ARO   DOB: [birth year]  MR#: [id number]  STM#:[id number]  PCP: Leeanne Rio, PA-C GYN: SU: Coralie Keens OTHER MD: Crissie Reese, Linna Hoff Bensimhon  CHIEF COMPLAINT:  Estrogen-receptor positive bilateral Breast Cancers  CURRENT TREATMENT: Anastrozole   HISTORY OF BREAST CANCER.: From the original intake note:  The patient noted a small amount of drainage from her left breast December of 2012. She brought it to her gynecologist's attention in January of 2013 and was set up for bilateral diagnostic mammography at the [facility] Center July 24, 2011. This was the patient's first ever mammogram. It showed a large irregular mass in the left retroareolar region extending to the nipple, with nipple retraction and skin thickening. This measured approximately 8.4 cm. It was associated with pleomorphic microcalcifications. By exam there was moderate distortion and retraction of the nipple with a large palpable ill-defined area in the retroareolar region. In the right right breast there was also a 2 cm hard mass palpated. Ultrasound of the right breast mass showed a complex cystic/solid area measuring 1.9 cm. Ultrasound of the right axilla was negative. Ultrasound of the left breast showed a large hypoechoic mass measuring at least 3.8 cm. The left axilla showed several adjacent abnormal appearing lymph nodes.  With this information biopsy of the right and left breast masses were obtained the same day, and showed ([id number])  (a) on the right, and invasive ductal carcinoma with papillary features, estrogen and progesterone receptor positive, HER-2 negative, with an MIB-1 of 10%.  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 The MIB-1 was 60% for the left-sided tumor.  With this information the patient was presented at the multidisciplinary breast cancer conference 08/06/2011.   Subsequent evaluation and treatments are as detailed below.  INTERVAL HISTORY: Linda [name] returns today for followup of her bilateral breast cancers. The interval history is unremarkable. She continues on anastrozole, with excellent tolerance. Specifically, hot flashes and vaginal density are not major issues for her. She exercises mostly by walking. I suggested she try one of thos APPS that measure have any steps she takes during the day.  REVIEW OF SYSTEMS: A detailed review of systems today was entirely noncontributory  PAST MEDICAL HISTORY: Past Medical History  Diagnosis Date  . Cancer 08-13-11    07-31-11-Dx. Bilarteral Breast cancer-left greater than rt.  . Hematuria, undiagnosed cause 08-13-11    Being evaluated by [facility] 08-14-11  . Hypertension   . Bronchitis     hx  . GERD (gastroesophageal reflux disease)     doing well  . 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 Seroma 02/04/12    right breast  200cc removed  erythema on right side  . Seasonal allergies   . History of radiation therapy 02/20/12-04/15/12    left breast,total 60.4 Gy  . Wears partial dentures     wears upper and lower partial  . Allergy   . Blood transfusion without reported diagnosis     PAST SURGICAL HISTORY: Past Surgical History  Procedure Laterality Date  . Child birth  08-13-11    x3 -NVD  . Portacath placement  08/15/2011    Procedure: INSERTION PORT-A-CATH;  Surgeon: Harl Bowie, MD;  Location: [facility];  Service: General;  Laterality: N/A;  . Mastectomy w/ sentinel  node biopsy  01/21/2012    Procedure: MASTECTOMY WITH SENTINEL LYMPH NODE BIOPSY;  Surgeon: Harl Bowie, MD;  Location: [place];  Service: General;  Laterality: Bilateral;  Left modified radical mastectomy, Rt mastectomy with Sentinel lymphnode biospy  . Breast surgery    . Port-a-cath removal  Right 12/22/2012    Procedure: REMOVAL PORT-A-CATH;  Surgeon: Harl Bowie, MD;  Location: [place];  Service: General;  Laterality: Right;    FAMILY HISTORY Family History  Problem Relation Age of Onset  . Heart disease Mother   . Cancer Mother 42    breast, , 87 deceased  . Heart attack Father   . Cancer Sister 63    breast  . Colon cancer Neg Hx   The patient's father died from a heart attack at the age of 32. The patient's mother died from apparently heart problems at the age of 40. The patient had no brothers. She has 3 sisters. One of her sisters was diagnosed with breast cancer in her mid 63s. The patient does not know if his sister was ever genetically tested. The patient's mother had a mastectomy at the age of 110, presumably for breast cancer. There is no other history of breast or in cancer in the family to her knowledge.   GYNECOLOGIC HISTORY:  She does not recall when she had menarche. She had her first child at age 50. She is GX P3. She underwent menopause in her mid 90s. She never took hormone replacement.   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 She moved to [place] about 2 years ago but has a Games developer in [place]. Son Gerald Stabs lives in [place] and works as a Airline pilot. His wife is a Marine scientist. Son Shanon Brow lives at [place] and is a Advertising account executive in addition to having the [employer]. The patient attends a local [employer] here   ADVANCED DIRECTIVES: Not in place  HEALTH MAINTENANCE: History  Substance Use Topics  . Smoking status: Never Smoker   . Smokeless tobacco: Never Used  . Alcohol Use: Yes     Comment: rare- occ.     Colonoscopy: Overdue  PAP: Feb 2013  Bone density: November 2013, normal  Lipid panel:   Allergies  Allergen Reactions  . Ace Inhibitors Cough    Current Outpatient  Prescriptions  Medication Sig Dispense Refill  . anastrozole (ARIMIDEX) 1 MG tablet Take 1 tablet (1 mg total) by mouth daily. 30 tablet 12  . b complex vitamins tablet Take 1 tablet by mouth daily.    . carvedilol (COREG) 3.125 MG tablet TAKE 1 TABLET BY MOUTH TWICE DAILY WITH A MEAL 60 tablet 0  . losartan-hydrochlorothiazide (HYZAAR) 50-12.5 MG per tablet TAKE 1 TABLET BY MOUTH DAILY. 30 tablet 3  . MOVIPREP 100 G SOLR Take 1 kit (200 g total) by mouth once. moviprep as directed. No substitutions 1 kit 0  . Multiple Vitamins-Iron (MULTIVITAMINS WITH IRON) TABS Take 1 tablet by mouth daily.    [name] POTASSIUM PO Take by mouth daily as needed.    . vitamin C (ASCORBIC ACID) 500 MG tablet Take 500 mg by mouth daily.     No current facility-administered medications for this visit.    OBJECTIVE: [name] who appears stated age 68 Vitals:   05/22/14 1315  BP: 150/82  Pulse: 82  Temp: 98 F (36.7 C)  Resp: 19     Body mass index is 37.38 kg/(m^2).    ECOG FS: 0 Filed Weights   05/22/14 1315  Weight: 245 lb 12.8 oz (111.494 kg)   Sclerae unicteric, EOMs intact Oropharynx clear, teeth in good repair No cervical or supraclavicular adenopathy Lungs no rales or rhonchi Heart regular rate and rhythm Abd soft, obese, nontender, positive bowel sounds MSK no focal spinal tenderness, no upper extremity lymphedema Neuro: nonfocal, well oriented, positive affect Breasts: Status post bilateral mastectomies. There is no evidence of chest wall recurrence. Both axillae are benign.    LAB RESULTS: Lab Results  Component Value Date   WBC 5.3 05/15/2014   NEUTROABS 3.3 05/15/2014   HGB 11.8 05/15/2014   HCT 35.8 05/15/2014   MCV 94.0 05/15/2014   PLT 191 05/15/2014      Chemistry      Component Value Date/Time   NA 142 05/15/2014 0857   NA 141 06/30/2013 0959   K 3.5 05/15/2014 0857   K 4.5 06/30/2013 0959   CL 104 06/30/2013 0959   CL 107 11/01/2012 1045   CO2 28  05/15/2014 0857   CO2 32 06/30/2013 0959   BUN 10.0 05/15/2014 0857   BUN 11 06/30/2013 0959   CREATININE 0.7 05/15/2014 0857   CREATININE 0.61 06/30/2013 0959   CREATININE 0.70 12/21/2012 1541      Component Value Date/Time   CALCIUM 9.6 05/15/2014 0857   CALCIUM 9.4 06/30/2013 0959   ALKPHOS 89 05/15/2014 0857   ALKPHOS 80 06/30/2013 0959   AST 12 05/15/2014 0857   AST 17 06/30/2013 0959   ALT 8 05/15/2014 0857   ALT 13 06/30/2013 0959   BILITOT 0.46 05/15/2014 0857   BILITOT 0.5 06/30/2013 0959       STUDIES: CLINICAL DATA: Postmenopausal. Patient has taken chemotherapy for cancer in the recent past.  EXAM: DUAL X-RAY ABSORPTIOMETRY (DXA) FOR BONE MINERAL DENSITY  FINDINGS: AP LUMBAR SPINE L3 and L4  Bone Mineral Density (BMD): 1.043 g/cm2  Young Adult T-Score: -0.5  Z-Score: 1.5  LEFT FEMUR NECK  Bone Mineral Density (BMD): 0.834 g/cm2  Young Adult T-Score: -0.1  Z-Score: 1.5  ASSESSMENT: Patient's diagnostic category is NORMAL by WHO Criteria.  FRACTURE RISK: Not significantly increased.  FRAX: Not assessed, all T-scores at or above -1.0.  COMPARISON: 05/03/2012. 4.2% increase in density in the lumbar spine since the prior study. 7.1% decrease in density for the proximal left femur since the prior exam.  Effective therapies are available in the form of bisphosphonates  ASSESSMENT: 68 y.o.  [place] woman   (1)  status post bilateral breast biopsies 07/24/2011, showing,      (a) on the right, a clinical T2 N0 papillary/ductal breast cancer, estrogen and progesterone receptor positive, HER-2 negative, with an MIB-1 of 10%;     (b) on the left, a clinical T3 N1, stage IIIA invasive ductal carcinoma, grade 3, triple positive, with an MIB-1 of 60%.  (2)  Status post 4 dose dense cycles of doxorubicin/ cyclophosphamide followed by 4 dose dense cycles of paclitaxel and trastuzumab completed 12/09/2011  (3) the trastuzumab was  continued for a total of one year (to 11/01/2012). Final echo on 11/04/2012 showing a well preserved ejection fraction of 55-60%.  (4) s/p bilateral mastectomies 01/21/2012 showing   (a) on the Right, an 8 mm invasive papillary carcinoma, grade 1, ypT1b ypN0   (b) on the Left, multiple microscopic foci  of residual  Invasive ductal carcinoma with evidence of dermal lymphatic involvement, pyT1a/T4 pyN0  (5)  Postmastectomy radiation, completed 04/15/2012  (6) Started anastrazole 04/16/2012  (a) dexa scan 05/19/2014 normal (improved)  PLAN: Linda [name] is 2 years into her anastrozole with no significant side effects. Her bone density is normal.  The plan is to continue anastrozole for an additional 3 years. The best time for [place] to see her, because of her work, is after the April tax season, so we will see her again in may and then yearly thereafter until she completes the remaining of her anastrozole.  She is behind on her colonoscopy screening schedule. She will bring this up with her primary care physician, Dr. Hassell Done, at their upcoming visit.  Otherwise Linda [name] knows to call for any problems that may develop before her next visit here.  Chauncey Cruel, MD    05/22/2014

## 2014-05-22 NOTE — Addendum Note (Signed)
Addended by: Laureen Abrahams on: 05/22/2014 05:47 PM   Modules accepted: Medications

## 2014-05-22 NOTE — Telephone Encounter (Signed)
per pof to sch pt appt-gave pt copy of sch °

## 2014-08-09 ENCOUNTER — Other Ambulatory Visit (HOSPITAL_COMMUNITY): Payer: Self-pay | Admitting: Adult Health

## 2014-09-04 ENCOUNTER — Ambulatory Visit (INDEPENDENT_AMBULATORY_CARE_PROVIDER_SITE_OTHER): Payer: Medicare Other | Admitting: Physician Assistant

## 2014-09-04 ENCOUNTER — Encounter: Payer: Self-pay | Admitting: Physician Assistant

## 2014-09-04 VITALS — BP 135/62 | HR 92 | Temp 98.4°F | Resp 16 | Ht 68.0 in | Wt 242.5 lb

## 2014-09-04 DIAGNOSIS — R052 Subacute cough: Secondary | ICD-10-CM

## 2014-09-04 DIAGNOSIS — R05 Cough: Secondary | ICD-10-CM

## 2014-09-04 MED ORDER — HYDROCOD POLST-CHLORPHEN POLST 10-8 MG/5ML PO LQCR: 5.0000 mL | Freq: Every evening | ORAL | Status: DC | PRN

## 2014-09-04 MED ORDER — BENZONATATE 200 MG PO CAPS: 200.0000 mg | ORAL_CAPSULE | Freq: Three times a day (TID) | ORAL | Status: DC | PRN

## 2014-09-04 NOTE — Progress Notes (Signed)
Patient presents to clinic today c/o mild non-productive cough x 4 weeks. Denies hx of seasonal allergies. Denies allergy symptoms at present.   Denies heartburn.  Endorses + globus sensation.  Denies nausea or vomiting.  Denies dysphagia. Denies smoking history.  Past Medical History  Diagnosis Date  . Cancer 08-13-11    07-31-11-Dx. Bilarteral Breast cancer-left greater than rt.  . Hematuria, undiagnosed cause 08-13-11    Being evaluated by [facility] 08-14-11  . Hypertension   . Bronchitis     hx  . GERD (gastroesophageal reflux disease)     doing well  . 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 Seroma 02/04/12    right breast  200cc removed  erythema on right side  . Seasonal allergies   . History of radiation therapy 02/20/12-04/15/12    left breast,total 60.4 Gy  . Wears partial dentures     wears upper and lower partial  . Allergy   . Blood transfusion without reported diagnosis     Current Outpatient Prescriptions on File Prior to Visit  Medication Sig Dispense Refill  . b complex vitamins tablet Take 1 tablet by mouth daily.    [name] losartan-hydrochlorothiazide (HYZAAR) 50-12.5 MG per tablet TAKE 1 TABLET BY MOUTH DAILY. 30 tablet 3  . Multiple Vitamins-Iron (MULTIVITAMINS WITH IRON) TABS Take 1 tablet by mouth daily.    [name] MOVIPREP 100 G SOLR Take 1 kit (200 g total) by mouth once. moviprep as directed. No substitutions (Patient not taking: Reported on 09/04/2014) 1 kit 0   No current facility-administered medications on file prior to visit.    Allergies  Allergen Reactions  . Ace Inhibitors Cough    Family History  Problem Relation Age of Onset  . Heart disease Mother   . Cancer Mother 61    breast, , 76 deceased  . Heart attack Father   . Cancer Sister 51    breast  . Colon cancer Neg Hx     History   Social History  . Marital Status: Single   Spouse Name: N/A  . Number of Children: 3  . Years of Education: N/A   Occupational History  . Retired    Social History Main Topics  . Smoking status: Never Smoker   . Smokeless tobacco: Never Used  . Alcohol Use: Yes     Comment: rare- occ.  . Drug Use: No  . Sexual Activity: No     Comment: gxp3, 1st child age 5, menopause mid 37's,no hrt   Other Topics Concern  . None   Social History Narrative   Review of Systems - See HPI.  All other ROS are negative.  BP 135/62 mmHg  Pulse 92  Temp(Src) 98.4 F (36.9 C) (Oral)  Resp 16  Ht 5' 8"  (1.727 m)  Wt 242 lb 8 oz (109.997 kg)  BMI 36.88 kg/m2  SpO2 97%  Physical Exam  Constitutional: She is oriented to person, place, and time and well-developed, well-nourished, and in no distress.  HENT:  Head: Normocephalic and atraumatic.  Right Ear: External ear normal.  Left Ear: External ear normal.  Nose: Nose normal.  Mouth/Throat: Oropharynx is clear and moist. No oropharyngeal exudate.  TM within normal limits bilaterally.  Eyes: Conjunctivae are normal. Pupils are equal, round, and reactive to light.  Neck: Neck supple.  Cardiovascular: Normal rate, regular rhythm,  normal heart sounds and intact distal pulses.   Pulmonary/Chest: Effort normal and breath sounds normal. No respiratory distress. She has no wheezes. She has no rales. She exhibits no tenderness.  Lymphadenopathy:    She has no cervical adenopathy.  Neurological: She is alert and oriented to person, place, and time.  Skin: Skin is warm and dry. No rash noted.  Psychiatric: Affect normal.  Vitals reviewed.  Assessment/Plan: Subacute cough In a nonsmoker. Positive globus sensation. Concern for silent reflux, given patient's history of GERD. We'll begin Zantac twice a day. Rx Tessalon for daytime cough. Tussionex for bedtime cough. Follow-up in one week. If persistent, chest x-ray and ENT evaluation.

## 2014-09-04 NOTE — Progress Notes (Signed)
Pre visit review using our clinic review tool, if applicable. No additional management support is needed unless otherwise documented below in the visit note/SLS  

## 2014-09-04 NOTE — Assessment & Plan Note (Signed)
In a nonsmoker. Positive globus sensation. Concern for silent reflux, given patient's history of GERD. We'll begin Zantac twice a day. Rx Tessalon for daytime cough. Tussionex for bedtime cough. Follow-up in one week. If persistent, chest x-ray and ENT evaluation.

## 2014-09-04 NOTE — Patient Instructions (Signed)
Please take the Tessalon as directed for daytime cough. Take the Tussionex for bedtime cough. Stay well hydrated.  Take a zantac 75 mg tablet twice daily over the next week.  Follow-up in 1 week.

## 2014-09-10 ENCOUNTER — Ambulatory Visit (INDEPENDENT_AMBULATORY_CARE_PROVIDER_SITE_OTHER): Payer: Medicare Other

## 2014-09-10 ENCOUNTER — Ambulatory Visit (INDEPENDENT_AMBULATORY_CARE_PROVIDER_SITE_OTHER): Payer: Medicare Other | Admitting: Family Medicine

## 2014-09-10 ENCOUNTER — Encounter (HOSPITAL_COMMUNITY): Payer: Self-pay | Admitting: Emergency Medicine

## 2014-09-10 ENCOUNTER — Inpatient Hospital Stay (HOSPITAL_COMMUNITY)
Admission: EM | Admit: 2014-09-10 | Discharge: 2014-09-12 | DRG: 186 | Disposition: A | Discharge status: Home / Self Care | Payer: Medicare Other | Attending: Internal Medicine | Admitting: Internal Medicine

## 2014-09-10 VITALS — BP 108/64 | HR 108 | Temp 99.4°F | Resp 20 | Ht 68.0 in | Wt 246.2 lb

## 2014-09-10 DIAGNOSIS — Z8249 Family history of ischemic heart disease and other diseases of the circulatory system: Secondary | ICD-10-CM | POA: Diagnosis not present

## 2014-09-10 DIAGNOSIS — J9601 Acute respiratory failure with hypoxia: Secondary | ICD-10-CM | POA: Diagnosis present

## 2014-09-10 DIAGNOSIS — D6489 Other specified anemias: Secondary | ICD-10-CM

## 2014-09-10 DIAGNOSIS — R05 Cough: Secondary | ICD-10-CM | POA: Diagnosis not present

## 2014-09-10 DIAGNOSIS — J90 Pleural effusion, not elsewhere classified: Secondary | ICD-10-CM | POA: Diagnosis present

## 2014-09-10 DIAGNOSIS — R059 Cough, unspecified: Secondary | ICD-10-CM

## 2014-09-10 DIAGNOSIS — Z79899 Other long term (current) drug therapy: Secondary | ICD-10-CM

## 2014-09-10 DIAGNOSIS — Z9013 Acquired absence of bilateral breasts and nipples: Secondary | ICD-10-CM | POA: Diagnosis present

## 2014-09-10 DIAGNOSIS — I10 Essential (primary) hypertension: Secondary | ICD-10-CM | POA: Diagnosis present

## 2014-09-10 DIAGNOSIS — J948 Other specified pleural conditions: Secondary | ICD-10-CM

## 2014-09-10 DIAGNOSIS — Z888 Allergy status to other drugs, medicaments and biological substances status: Secondary | ICD-10-CM

## 2014-09-10 DIAGNOSIS — I5033 Acute on chronic diastolic (congestive) heart failure: Secondary | ICD-10-CM | POA: Diagnosis present

## 2014-09-10 DIAGNOSIS — Z9221 Personal history of antineoplastic chemotherapy: Secondary | ICD-10-CM | POA: Diagnosis not present

## 2014-09-10 DIAGNOSIS — Z803 Family history of malignant neoplasm of breast: Secondary | ICD-10-CM | POA: Diagnosis not present

## 2014-09-10 DIAGNOSIS — R0600 Dyspnea, unspecified: Secondary | ICD-10-CM | POA: Diagnosis present

## 2014-09-10 DIAGNOSIS — C50912 Malignant neoplasm of unspecified site of left female breast: Secondary | ICD-10-CM | POA: Diagnosis present

## 2014-09-10 DIAGNOSIS — C50911 Malignant neoplasm of unspecified site of right female breast: Secondary | ICD-10-CM | POA: Diagnosis not present

## 2014-09-10 DIAGNOSIS — Z6836 Body mass index (BMI) 36.0-36.9, adult: Secondary | ICD-10-CM

## 2014-09-10 DIAGNOSIS — R42 Dizziness and giddiness: Secondary | ICD-10-CM | POA: Diagnosis not present

## 2014-09-10 DIAGNOSIS — I517 Cardiomegaly: Secondary | ICD-10-CM | POA: Diagnosis not present

## 2014-09-10 DIAGNOSIS — J302 Other seasonal allergic rhinitis: Secondary | ICD-10-CM | POA: Diagnosis present

## 2014-09-10 DIAGNOSIS — K219 Gastro-esophageal reflux disease without esophagitis: Secondary | ICD-10-CM | POA: Diagnosis present

## 2014-09-10 DIAGNOSIS — Z923 Personal history of irradiation: Secondary | ICD-10-CM | POA: Diagnosis not present

## 2014-09-10 DIAGNOSIS — J910 Malignant pleural effusion: Secondary | ICD-10-CM | POA: Diagnosis present

## 2014-09-10 HISTORY — DX: Radiodermatitis, unspecified: L58.9

## 2014-09-10 HISTORY — DX: Malignant neoplasm of central portion of left female breast: C50.112

## 2014-09-10 LAB — BLOOD GAS, VENOUS
ACID-BASE EXCESS: 3 mmol/L — AB (ref 0.0–2.0)
Bicarbonate: 26.4 mEq/L — ABNORMAL HIGH (ref 20.0–24.0)
FIO2: 0.21 %
O2 Saturation: 84 %
PCO2 VEN: 37.2 mmHg — AB (ref 45.0–50.0)
Patient temperature: 98.6
TCO2: 24.1 mmol/L (ref 0–100)
pH, Ven: 7.464 — ABNORMAL HIGH (ref 7.250–7.300)
pO2, Ven: 50.2 mmHg — ABNORMAL HIGH (ref 30.0–45.0)

## 2014-09-10 LAB — POCT CBC
Granulocyte percent: 85 %G — AB (ref 37–80)
HCT, POC: 35.1 % — AB (ref 37.7–47.9)
Hemoglobin: 11 g/dL — AB (ref 12.2–16.2)
Lymph, poc: 1.3 (ref 0.6–3.4)
MCH, POC: 28.5 pg (ref 27–31.2)
MCHC: 31.4 g/dL — AB (ref 31.8–35.4)
MCV: 90.7 fL (ref 80–97)
MID (cbc): 0.4 (ref 0–0.9)
MPV: 6.7 fL (ref 0–99.8)
POC Granulocyte: 9.9 — AB (ref 2–6.9)
POC LYMPH PERCENT: 11.6 %L (ref 10–50)
POC MID %: 3.4 %M (ref 0–12)
Platelet Count, POC: 243 10*3/uL (ref 142–424)
RBC: 3.87 M/uL — AB (ref 4.04–5.48)
RDW, POC: 14.1 %
WBC: 11.6 10*3/uL — AB (ref 4.6–10.2)

## 2014-09-10 LAB — COMPREHENSIVE METABOLIC PANEL
ALT: 26 U/L (ref 0–35)
AST: 39 U/L — AB (ref 0–37)
Albumin: 2.9 g/dL — ABNORMAL LOW (ref 3.5–5.2)
Alkaline Phosphatase: 99 U/L (ref 39–117)
Anion gap: 10 (ref 5–15)
BUN: 25 mg/dL — ABNORMAL HIGH (ref 6–23)
CALCIUM: 8.4 mg/dL (ref 8.4–10.5)
CO2: 26 mmol/L (ref 19–32)
Chloride: 98 mmol/L (ref 96–112)
Creatinine, Ser: 1.02 mg/dL (ref 0.50–1.10)
GFR calc non Af Amer: 55 mL/min — ABNORMAL LOW (ref >90)
GFR, EST AFRICAN AMERICAN: 64 mL/min — AB (ref >90)
GLUCOSE: 99 mg/dL (ref 70–99)
Potassium: 3.6 mmol/L (ref 3.5–5.1)
Sodium: 134 mmol/L — ABNORMAL LOW (ref 135–145)
Total Bilirubin: 0.8 mg/dL (ref 0.3–1.2)
Total Protein: 7.1 g/dL (ref 6.0–8.3)

## 2014-09-10 LAB — CBC WITH DIFFERENTIAL/PLATELET
BASOS PCT: 0 % (ref 0–1)
Basophils Absolute: 0 10*3/uL (ref 0.0–0.1)
EOS ABS: 0 10*3/uL (ref 0.0–0.7)
Eosinophils Relative: 0 % (ref 0–5)
HEMATOCRIT: 33.2 % — AB (ref 36.0–46.0)
HEMOGLOBIN: 10.5 g/dL — AB (ref 12.0–15.0)
LYMPHS ABS: 0.9 10*3/uL (ref 0.7–4.0)
Lymphocytes Relative: 9 % — ABNORMAL LOW (ref 12–46)
MCH: 29.6 pg (ref 26.0–34.0)
MCHC: 31.6 g/dL (ref 30.0–36.0)
MCV: 93.5 fL (ref 78.0–100.0)
Monocytes Absolute: 1 10*3/uL (ref 0.1–1.0)
Monocytes Relative: 10 % (ref 3–12)
NEUTROS PCT: 81 % — AB (ref 43–77)
Neutro Abs: 8.2 10*3/uL — ABNORMAL HIGH (ref 1.7–7.7)
Platelets: 207 10*3/uL (ref 150–400)
RBC: 3.55 MIL/uL — AB (ref 3.87–5.11)
RDW: 13.7 % (ref 11.5–15.5)
WBC: 10.2 10*3/uL (ref 4.0–10.5)

## 2014-09-10 LAB — URINE MICROSCOPIC-ADD ON

## 2014-09-10 LAB — URINALYSIS, ROUTINE W REFLEX MICROSCOPIC
Glucose, UA: NEGATIVE mg/dL
KETONES UR: NEGATIVE mg/dL
NITRITE: NEGATIVE
PROTEIN: 100 mg/dL — AB
Specific Gravity, Urine: 1.023 (ref 1.005–1.030)
Urobilinogen, UA: 1 mg/dL (ref 0.0–1.0)
pH: 5.5 (ref 5.0–8.0)

## 2014-09-10 LAB — D-DIMER, QUANTITATIVE (NOT AT ARMC): D DIMER QUANT: 6.1 ug{FEU}/mL — AB (ref 0.00–0.48)

## 2014-09-10 LAB — BRAIN NATRIURETIC PEPTIDE: B Natriuretic Peptide: 80.9 pg/mL (ref 0.0–100.0)

## 2014-09-10 LAB — TSH: TSH: 1.297 u[IU]/mL (ref 0.350–4.500)

## 2014-09-10 MED ORDER — POLYETHYLENE GLYCOL 3350 17 G PO PACK: 17.0000 g | PACK | Freq: Every day | ORAL | Status: DC | PRN

## 2014-09-10 MED ORDER — ENOXAPARIN SODIUM 60 MG/0.6ML ~~LOC~~ SOLN
55.0000 mg | SUBCUTANEOUS | Status: DC
  Administered 2014-09-10 – 2014-09-11 (×2): 55 mg via SUBCUTANEOUS
  Filled 2014-09-10 (×3): qty 0.6

## 2014-09-10 MED ORDER — ALUM & MAG HYDROXIDE-SIMETH 200-200-20 MG/5ML PO SUSP: 30.0000 mL | Freq: Four times a day (QID) | ORAL | Status: DC | PRN

## 2014-09-10 MED ORDER — B COMPLEX-C PO TABS
1.0000 | ORAL_TABLET | Freq: Every day | ORAL | Status: DC
  Administered 2014-09-11 – 2014-09-12 (×2): 1 via ORAL
  Filled 2014-09-10 (×2): qty 1

## 2014-09-10 MED ORDER — SODIUM CHLORIDE 0.9 % IJ SOLN
3.0000 mL | Freq: Two times a day (BID) | INTRAMUSCULAR | Status: DC
  Administered 2014-09-10 – 2014-09-11 (×3): 3 mL via INTRAVENOUS

## 2014-09-10 MED ORDER — FAMOTIDINE 20 MG PO TABS
20.0000 mg | ORAL_TABLET | Freq: Two times a day (BID) | ORAL | Status: DC
  Administered 2014-09-10 – 2014-09-12 (×4): 20 mg via ORAL
  Filled 2014-09-10 (×5): qty 1

## 2014-09-10 MED ORDER — BENZONATATE 100 MG PO CAPS
200.0000 mg | ORAL_CAPSULE | Freq: Two times a day (BID) | ORAL | Status: DC | PRN
  Administered 2014-09-10: 200 mg via ORAL
  Filled 2014-09-10: qty 2

## 2014-09-10 MED ORDER — SODIUM CHLORIDE 0.9 % IV BOLUS (SEPSIS)
1000.0000 mL | Freq: Once | INTRAVENOUS | Status: AC
  Administered 2014-09-10: 1000 mL via INTRAVENOUS

## 2014-09-10 MED ORDER — ONDANSETRON HCL 4 MG/2ML IJ SOLN: 4.0000 mg | Freq: Four times a day (QID) | INTRAMUSCULAR | Status: DC | PRN

## 2014-09-10 MED ORDER — ENOXAPARIN SODIUM 40 MG/0.4ML ~~LOC~~ SOLN
40.0000 mg | SUBCUTANEOUS | Status: DC
  Filled 2014-09-10: qty 0.4

## 2014-09-10 MED ORDER — B COMPLEX PO TABS: 1.0000 | ORAL_TABLET | Freq: Every day | ORAL | Status: DC

## 2014-09-10 MED ORDER — ANASTROZOLE 1 MG PO TABS
1.0000 mg | ORAL_TABLET | Freq: Every day | ORAL | Status: DC
Start: 2014-09-11 — End: 2014-09-12
  Administered 2014-09-11 – 2014-09-12 (×2): 1 mg via ORAL
  Filled 2014-09-10 (×2): qty 1

## 2014-09-10 MED ORDER — ACETAMINOPHEN 650 MG RE SUPP: 650.0000 mg | Freq: Four times a day (QID) | RECTAL | Status: DC | PRN

## 2014-09-10 MED ORDER — DEXTROMETHORPHAN POLISTIREX 30 MG/5ML PO LQCR
30.0000 mg | Freq: Two times a day (BID) | ORAL | Status: DC
  Administered 2014-09-10 – 2014-09-12 (×4): 30 mg via ORAL
  Filled 2014-09-10 (×5): qty 5

## 2014-09-10 MED ORDER — SODIUM CHLORIDE 0.9 % IJ SOLN: 3.0000 mL | INTRAMUSCULAR | Status: DC | PRN

## 2014-09-10 MED ORDER — HYDROCOD POLST-CHLORPHEN POLST 10-8 MG/5ML PO LQCR
5.0000 mL | Freq: Every evening | ORAL | Status: AC | PRN
Start: 2014-09-10 — End: 2014-09-10
  Administered 2014-09-10: 5 mL via ORAL
  Filled 2014-09-10: qty 5

## 2014-09-10 MED ORDER — TAB-A-VITE/IRON PO TABS
1.0000 | ORAL_TABLET | Freq: Every day | ORAL | Status: DC
  Administered 2014-09-11 – 2014-09-12 (×2): 1 via ORAL
  Filled 2014-09-10 (×2): qty 1

## 2014-09-10 MED ORDER — ACETAMINOPHEN 325 MG PO TABS
650.0000 mg | ORAL_TABLET | Freq: Four times a day (QID) | ORAL | Status: DC | PRN
  Administered 2014-09-11: 650 mg via ORAL
  Filled 2014-09-10: qty 2

## 2014-09-10 MED ORDER — ONDANSETRON HCL 4 MG PO TABS: 4.0000 mg | ORAL_TABLET | Freq: Four times a day (QID) | ORAL | Status: DC | PRN

## 2014-09-10 MED ORDER — SODIUM CHLORIDE 0.9 % IV SOLN: 250.0000 mL | INTRAVENOUS | Status: DC | PRN

## 2014-09-10 NOTE — ED Notes (Signed)
Bed: JJ88 Expected date: 09/10/14 Expected time:  Means of arrival:  Comments: Left Effusion from UC/ Ca pt

## 2014-09-10 NOTE — ED Notes (Signed)
Pt with hx of breast cancer seen at Urgent Care today for SOB, x-ray diagnosed left pleural effusion. Pt denies active chemo currently.

## 2014-09-10 NOTE — Progress Notes (Signed)
Subjective:    Patient ID: Linda [name], female    DOB: [birth year], 69 y.o.   MRN: [id number] This chart was scribed for Robyn Haber, MD by Marti Sleigh, Medical Scribe. This patient was seen in Room 3 and the patient's care was started a 12:16 PM.  Chief Complaint  Patient presents with   Cough    cough that is dry.      HPI HPI Comments: Linda [name] is a 69 y.o. female who is currently being treated for breast cancer, and has a past hx HTN, GERD, who presents to [facility] complaining of a dry hacking cough for the last two months. Pt endorses associated intermittent low grade fever (99) and fatigue. Pt states she had allergies while she was going through chemotherapy, but not otherwise.   Pt states that she reported to her PCP six days ago and was diagnosed with subacute cough, and was prescribed tessalon pearls, tussionex and zantac. Pt states her sx are unchanged. Pt's PCP is Raiford Noble PA-C.    Review of Systems  Constitutional: Positive for fever and fatigue. Negative for chills.  HENT: Positive for congestion.   Respiratory: Positive for cough and wheezing.        Objective:   Physical Exam  Constitutional: She is oriented to person, place, and time. She appears well-developed and well-nourished. No distress.  HENT:  Head: Normocephalic and atraumatic.  Eyes: Pupils are equal, round, and reactive to light.  Neck: Neck supple.  Cardiovascular: Normal rate.   Pulmonary/Chest: Effort normal. No respiratory distress.  Decreased breath sounds on the left with bibasilar fine rales  Musculoskeletal: Normal range of motion.  Neurological: She is alert and oriented to person, place, and time. Coordination normal.  Skin: Skin is warm and dry. She is not diaphoretic.  Psychiatric: She has a normal mood and affect. Her behavior is normal.  Nursing note and vitals reviewed.  [facility] reading (PRIMARY) by  Dr. Joseph Art chest x-ray shows large left pleural  effusion.  Results for orders placed or performed in visit on 09/10/14  POCT CBC  Result Value Ref Range   WBC 11.6 (A) 4.6 - 10.2 K/uL   Lymph, poc 1.3 0.6 - 3.4   POC LYMPH PERCENT 11.6 10 - 50 %L   MID (cbc) 0.4 0 - 0.9   POC MID % 3.4 0 - 12 %M   POC Granulocyte 9.9 (A) 2 - 6.9   Granulocyte percent 85.0 (A) 37 - 80 %G   RBC 3.87 (A) 4.04 - 5.48 M/uL   Hemoglobin 11.0 (A) 12.2 - 16.2 g/dL   HCT, POC 35.1 (A) 37.7 - 47.9 %   MCV 90.7 80 - 97 fL   MCH, POC 28.5 27 - 31.2 pg   MCHC 31.4 (A) 31.8 - 35.4 g/dL   RDW, POC 14.1 %   Platelet Count, POC 243 142 - 424 K/uL   MPV 6.7 0 - 99.8 fL        Assessment & Plan:   . This kind lady has a serious pleural effusion with a history of breast cancer. She will need to have the pleural effusion drained and evaluated.  I've instructed her to go directly to [facility] emergency room for further evaluation and treatment. This chart was scribed in my presence and reviewed by me personally.    ICD-9-CM ICD-10-CM   1. Cough 786.2 R05 POCT CBC     DG Chest 2 View  2. Pleural effusion 511.9 J90  3. Anemia due to other cause 285.8 D64.89      Signed, Robyn Haber, MD   Signed, Carola Frost.D.

## 2014-09-10 NOTE — ED Provider Notes (Signed)
CSN: [id number]     Arrival date & time 09/10/14  1321 History   First MD Initiated Contact with Patient 09/10/14 1450     Chief Complaint  Patient presents with  . Pleural Effusion     (Consider location/radiation/quality/duration/timing/severity/associated sxs/prior Treatment) Patient is a 69 y.o. female presenting with cough.  Cough Cough characteristics:  Non-productive Severity:  Moderate Onset quality:  Gradual Duration:  4 weeks Timing:  Constant Progression:  Worsening Chronicity:  New Smoker: no   Context: not upper respiratory infection   Relieved by:  Nothing Worsened by:  Deep breathing Ineffective treatments:  None tried Associated symptoms: chills and shortness of breath   Associated symptoms: no chest pain and no fever     Past Medical History  Diagnosis Date  . Cancer 08-13-11    07-31-11-Dx. Bilarteral Breast cancer-left greater than rt.  . Hematuria, undiagnosed cause 08-13-11    Being evaluated by [facility] 08-14-11  . Hypertension   . Bronchitis     hx  . GERD (gastroesophageal reflux disease)     doing well  . 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 Seroma 02/04/12    right breast  200cc removed  erythema on right side  . Seasonal allergies   . History of radiation therapy 02/20/12-04/15/12    left breast,total 60.4 Gy  . Wears partial dentures     wears upper and lower partial  . Allergy   . Blood transfusion without reported diagnosis    Past Surgical History  Procedure Laterality Date  . Child birth  08-13-11    x3 -NVD  . Portacath placement  08/15/2011    Procedure: INSERTION PORT-A-CATH;  Surgeon: Harl Bowie, MD;  Location: [facility];  Service: General;  Laterality: N/A;  . Mastectomy w/ sentinel node biopsy  01/21/2012    Procedure: MASTECTOMY WITH SENTINEL LYMPH NODE BIOPSY;  Surgeon: Harl Bowie, MD;  Location:  [place];  Service: General;  Laterality: Bilateral;  Left modified radical mastectomy, Rt mastectomy with Sentinel lymphnode biospy  . Breast surgery    . Port-a-cath removal Right 12/22/2012    Procedure: REMOVAL PORT-A-CATH;  Surgeon: Harl Bowie, MD;  Location: [place];  Service: General;  Laterality: Right;   Family History  Problem Relation Age of Onset  . Heart disease Mother   . Cancer Mother 26    breast, , 8 deceased  . Heart attack Father   . Cancer Sister 13    breast  . Colon cancer Neg Hx    History  Substance Use Topics  . Smoking status: Never Smoker   . Smokeless tobacco: Never Used  . Alcohol Use: 0.0 oz/week    0 Standard drinks or equivalent per week     Comment: rare- occ.   OB History    No data available     Review of Systems  Constitutional: Positive for chills. Negative for fever.  Respiratory: Positive for cough and shortness of breath.   Cardiovascular: Negative for chest pain.  All other systems reviewed and are negative.     Allergies  Ace inhibitors  Home Medications   Prior to Admission medications   Medication Sig Start Date End Date Taking? Authorizing Provider  anastrozole (ARIMIDEX) 1 MG tablet Take 1 mg by mouth daily.   Yes Historical Provider, MD  b  complex vitamins tablet Take 1 tablet by mouth daily.   Yes Historical Provider, MD  benzonatate (TESSALON) 200 MG capsule Take 1 capsule (200 mg total) by mouth 3 (three) times daily as needed for cough. 09/04/14  Yes Brunetta Jeans, PA-C  chlorpheniramine-HYDROcodone (TUSSIONEX PENNKINETIC ER) 10-8 MG/5ML LQCR Take 5 mLs by mouth at bedtime as needed for cough. 09/04/14  Yes Brunetta Jeans, PA-C  losartan-hydrochlorothiazide (HYZAAR) 50-12.5 MG per tablet TAKE 1 TABLET BY MOUTH DAILY. 08/10/14  Yes Jolaine Artist, MD  Multiple Vitamins-Iron (MULTIVITAMINS WITH IRON) TABS Take 1 tablet by mouth daily.   Yes Historical Provider, MD   ranitidine (ZANTAC) 75 MG tablet Take 75 mg by mouth 2 (two) times daily.   Yes Historical Provider, MD  MOVIPREP 100 G SOLR Take 1 kit (200 g total) by mouth once. moviprep as directed. No substitutions Patient not taking: Reported on 09/04/2014 08/08/13   Jerene Bears, MD   BP 111/63 mmHg  Pulse 110  Temp(Src) 98.3 F (36.8 C) (Oral)  Resp 22  SpO2 93% Physical Exam  Constitutional: She is oriented to person, place, and time. She appears well-developed and well-nourished.  HENT:  Head: Normocephalic and atraumatic.  Right Ear: External ear normal.  Left Ear: External ear normal.  Eyes: Conjunctivae and EOM are normal. Pupils are equal, round, and reactive to light.  Neck: Normal range of motion. Neck supple.  Cardiovascular: Normal rate, regular rhythm, normal heart sounds and intact distal pulses.   Pulmonary/Chest: Effort normal. She has decreased breath sounds in the left lower field. She has rales in the left middle field and the left lower field.  Abdominal: Soft. Bowel sounds are normal. There is no tenderness.  Musculoskeletal: Normal range of motion.  Neurological: She is alert and oriented to person, place, and time.  Skin: Skin is warm and dry.  Vitals reviewed.   ED Course  Procedures (including critical care time) Labs Review Labs Reviewed  CBC WITH DIFFERENTIAL/PLATELET - Abnormal; Notable for the following:    RBC 3.55 (*)    Hemoglobin 10.5 (*)    HCT 33.2 (*)    Neutrophils Relative % 81 (*)    Neutro Abs 8.2 (*)    Lymphocytes Relative 9 (*)    All other components within normal limits  COMPREHENSIVE METABOLIC PANEL - Abnormal; Notable for the following:    Sodium 134 (*)    BUN 25 (*)    Albumin 2.9 (*)    AST 39 (*)    GFR calc non Af Amer 55 (*)    GFR calc Af Amer 64 (*)    All other components within normal limits  URINALYSIS, ROUTINE W REFLEX MICROSCOPIC  BLOOD GAS, ARTERIAL    Imaging Review Dg Chest 2 View  09/10/2014   CLINICAL DATA:   Two-month history of nonproductive cough  EXAM: CHEST  2 VIEW  COMPARISON:  None.  FINDINGS: There is a sizable left pleural effusion with atelectatic change/consolidation in portions of the left lower lobe and lingula. Lungs elsewhere clear. The heart is slightly enlarged with pulmonary vascularity within normal limits. No adenopathy. There is degenerative change in thoracic spine. There are surgical clips in left axillary region.  IMPRESSION: Left effusion with left lower lobe and inferior lingular atelectasis/consolidation. Right lung clear. Heart slightly enlarged.   Electronically Signed   By: Lowella Grip III M.D.   On: 09/10/2014 13:42     EKG Interpretation   Date/Time:  Sunday September 10 2014 13:38:07 EDT Ventricular Rate:  98 PR Interval:  137 QRS Duration: 91 QT Interval:  332 QTC Calculation: 424 R Axis:   35 Text Interpretation:  Sinus rhythm Probable left atrial enlargement Low  voltage, extremity leads Baseline wander in lead(s) I II aVR aVF V5 No  significant change since last tracing Confirmed by Debby Freiberg [id number])  on 09/10/2014 3:15:03 PM      MDM   Final diagnoses:  Pleural effusion    69 y.o. female with pertinent PMH of prior breast ca in remission presents with new onset L pleural effusion from urgent care.  Physical exam today as above.  Pt mildly tachypneic on my exam to 22, appears uncomfortable, but not in respiratory distress.  Consulted hospitalist for admission.  I have reviewed all laboratory and imaging studies if ordered as above  1. Pleural effusion         Debby Freiberg, MD 09/10/14 [phone]

## 2014-09-10 NOTE — H&P (Signed)
History and Physical:    Linda [name]   BSW:[id number] DOB: December 31, 1945 DOA: 09/10/2014  Referring physician: Dr. Colin Rhein PCP: Leeanne Rio, PA-C   Chief Complaint: Dyspnea  History of Present Illness:   Linda [name] is an 69 y.o. female with a PMH of bilateral breast cancer diagnosed 07/2011, s/p chemotherapy with doxorubicin/ cyclophosphamide followed by paclitaxel and trastuzumab completed 12/09/2011 and bilateral mastectomy 01/21/12 and postmastectomy XRT completed 04/15/12 on treatment with anastrozole followed by Dr. Jana Hakim who presents with a chief complaint of a 4 week history non-productive cough and a 2 week history of worsening dypnea.  The patient saw her PCP about a week ago, was put on cough medicine at that time but did not experience any significant relief of her symptoms.  Exertion makes dyspnea worse.  The patient also reports some low grade fevers.  No sick contacts.  Has not had flu vaccine.  Has had pneumonia vaccine.  Upon initial evaluation in the ED, the patient's VS are stable.  No elevation of the WBC.  CXR shows a left sided pleural effusion with a slightly enlarged heart.  ROS:   Constitutional: + low grade fever, + chills;  Appetite diminished; No weight loss, +weight gain, no fatigue.  HEENT: No blurry vision, no diplopia, no pharyngitis, no dysphagia CV: No chest pain, no palpitations, no PND, no orthopnea, no edema.  Resp: + SOB, + cough, no pleuritic pain. GI: No nausea, no vomiting, no diarrhea, no melena, no hematochezia, + constipation, no abdominal pain.  GU: No dysuria, no hematuria, no frequency, no urgency. MSK: no myalgias, no arthralgias.  Neuro:  No headache, no focal neurological deficits, no history of seizures.  Psych: No depression, no anxiety.  Endo: No heat intolerance, no cold intolerance, no polyuria, no polydipsia  Skin: No rashes, no skin lesions.  Heme: + easy bruising.  Travel history: No recent travel.   Past Medical  History:   Past Medical History  Diagnosis Date  . Cancer 08-13-11    07-31-11-Dx. Bilarteral Breast cancer-left greater than rt.  . Hematuria, undiagnosed cause 08-13-11    Being evaluated by [facility] 08-14-11  . Hypertension   . Bronchitis     hx  . GERD (gastroesophageal reflux disease)     doing well  . 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 Seroma 02/04/12    right breast  200cc removed  erythema on right side  . Seasonal allergies   . History of radiation therapy 02/20/12-04/15/12    left breast,total 60.4 Gy  . Wears partial dentures     wears upper and lower partial  . Allergy   . Blood transfusion without reported diagnosis   . 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   . Cancer of central portion of left female breast 08/01/2011    Past Surgical History:   Past Surgical History  Procedure Laterality Date  . Child birth  08-13-11    x3 -NVD  . Portacath placement  08/15/2011    Procedure: INSERTION PORT-A-CATH;  Surgeon: Harl Bowie, MD;  Location: [facility];  Service: General;  Laterality: N/A;  . Mastectomy w/ sentinel node biopsy  01/21/2012    Procedure: MASTECTOMY WITH SENTINEL LYMPH NODE BIOPSY;  Surgeon: Harl Bowie, MD;  Location: [facility];  Service: General;  Laterality: Bilateral;  Left modified radical mastectomy, Rt mastectomy with Sentinel lymphnode biospy  . Breast surgery    . Port-a-cath removal Right 12/22/2012    Procedure: REMOVAL PORT-A-CATH;  Surgeon: Harl Bowie, MD;  Location: [facility];  Service: General;  Laterality: Right;    Social History:   History   Social History  . Marital Status: Single    Spouse Name: N/A  . Number of Children: 3  . Years of Education: N/A   Occupational History  . Retired    Social History Main Topics  .  Smoking status: Never Smoker   . Smokeless tobacco: Never Used  . Alcohol Use: 0.0 oz/week    0 Standard drinks or equivalent per week     Comment: rare- occ.  . Drug Use: No  . Sexual Activity: No     Comment: gxp3, 1st child age 52, menopause mid 70's,no hrt   Other Topics Concern  . Not on file   Social History Narrative   Lives with son.  Single.  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 She moved to [place] about 2 years ago but has a Games developer in [place]. Son Gerald Stabs lives in [place] and works as a Airline pilot. His wife is a Marine scientist. Son Shanon Brow lives at [place] and is a Advertising account executive in addition to having the [employer]. The patient attends a local [employer] here.    Family history:   Family History  Problem Relation Age of Onset  . Heart disease Mother   . Cancer Mother 61    breast, , 34 deceased  . Heart attack Father   . Cancer Sister 29    breast  . Colon cancer Neg Hx     Allergies   Ace inhibitors  Current Medications:   Prior to Admission medications   Medication Sig Start Date End Date Taking? Authorizing Provider  anastrozole (ARIMIDEX) 1 MG tablet Take 1 mg by mouth daily.   Yes Historical Provider, MD  b complex vitamins tablet Take 1 tablet by mouth daily.   Yes Historical Provider, MD  benzonatate (TESSALON) 200 MG capsule Take 1 capsule (200 mg total) by mouth 3 (three) times daily as needed for cough. 09/04/14  Yes Brunetta Jeans, PA-C  chlorpheniramine-HYDROcodone (TUSSIONEX PENNKINETIC ER) 10-8 MG/5ML LQCR Take 5 mLs by mouth at bedtime as needed for cough. 09/04/14  Yes Brunetta Jeans, PA-C  losartan-hydrochlorothiazide (HYZAAR) 50-12.5 MG per tablet TAKE 1 TABLET BY MOUTH DAILY. 08/10/14  Yes Jolaine Artist, MD  Multiple Vitamins-Iron (MULTIVITAMINS WITH IRON) TABS Take 1 tablet by mouth daily.   Yes Historical  Provider, MD  ranitidine (ZANTAC) 75 MG tablet Take 75 mg by mouth 2 (two) times daily.   Yes Historical Provider, MD  MOVIPREP 100 G SOLR Take 1 kit (200 g total) by mouth once. moviprep as directed. No substitutions Patient not taking: Reported on 09/04/2014 08/08/13   Jerene Bears, MD    Physical Exam:   Filed Vitals:   09/10/14 1415 09/10/14 1627 09/10/14 1647 09/10/14 1703  BP:    119/70  Pulse:  96 94 93  Temp:    98.5 F (36.9 C)  TempSrc:    Oral  Resp: 22 16 19 16   SpO2:  94% 93% 95%     Physical Exam: Blood pressure 119/70, pulse  93, temperature 98.5 F (36.9 C), temperature source Oral, resp. rate 16, SpO2 95 %. Gen: No acute distress. Head: Normocephalic, atraumatic. Eyes: PERRL, EOMI, sclerae nonicteric. Mouth: Oropharynx clear. Neck: Supple, no thyromegaly, no lymphadenopathy, no jugular venous distention. Chest: Lungs diminished on the left with crackles left base.  No axillary lymphadenopathy. CV: Heart sounds are tachy/reg without M/R/G. Abdomen: Soft, nontender, nondistended with normal active bowel sounds. Extremities: Extremities show 1-2+ edema. Skin: Warm and dry. Neuro: Alert and oriented times 3; cranial nerves II through XII grossly intact. Psych: Mood and affect normal.   Data Review:    Labs: Basic Metabolic Panel:  Recent Labs Lab 09/10/14 1513  NA 134*  K 3.6  CL 98  CO2 26  GLUCOSE 99  BUN 25*  CREATININE 1.02  CALCIUM 8.4   Liver Function Tests:  Recent Labs Lab 09/10/14 1513  AST 39*  ALT 26  ALKPHOS 99  BILITOT 0.8  PROT 7.1  ALBUMIN 2.9*   CBC:  Recent Labs Lab 09/10/14 1236 09/10/14 1513  WBC 11.6* 10.2  NEUTROABS  --  8.2*  HGB 11.0* 10.5*  HCT 35.1* 33.2*  MCV 90.7 93.5  PLT  --  207   Radiographic Studies: Dg Chest 2 View  09/10/2014   CLINICAL DATA:  Two-month history of nonproductive cough  EXAM: CHEST  2 VIEW  COMPARISON:  None.  FINDINGS: There is a sizable left pleural effusion with atelectatic  change/consolidation in portions of the left lower lobe and lingula. Lungs elsewhere clear. The heart is slightly enlarged with pulmonary vascularity within normal limits. No adenopathy. There is degenerative change in thoracic spine. There are surgical clips in left axillary region.  IMPRESSION: Left effusion with left lower lobe and inferior lingular atelectasis/consolidation. Right lung clear. Heart slightly enlarged.   Electronically Signed   By: Lowella Grip III M.D.   On: 09/10/2014 13:42   *I have personally reviewed the images above*  EKG: Independently reviewed. NSR at 98 bpm, LAE, no significant change from prior.   Assessment/Plan:   Principal Problem:   Pleural effusion on left - Differential includes CHF secondary to h/o cardiotoxic chemotherapy, parapneumonic effusion versus malignant pleural effusion. - Will check BNP and 2 D Echo to R/O cardiomyopathy.  Last Echo done 11/04/12, EF 93-73%, grade I diastolic dysfunction. - Will get IR to drain pleural effusion.  Send pleural fluid for gram stain, culture, cell count, glucose, protein, LDH and cytology. - Hold off on antibiotics for now.  Non toxic appearing, afebrile with no significant elevation of WBC. - Given tachycardia and h/o malignancy, check D-dimer.  May need to rule out P.E.   Active Problems:   Cough - Hold ACE-I for now.  Try Delsym 30 mg BID, but suspect this will be refractory until pleural fluid drained.    Hypertension - Hold lisinopril/HCTZ.    GERD (gastroesophageal reflux disease) - Continue H2 blocker.    Bilateral breast cancer - Will notify Dr. Jana Hakim of the patient's admission.    DVT prophylaxis - Lovenox ordered.  Code Status: Full. Family Communication: Shanon Brow, son, [phone]. Disposition Plan: Home when stable.  Time spent: 70 minutes.  Saquoia Sianez [facility] Pager [phone] Cell: [phone]   If 7PM-7AM, please contact night-coverage [email or web address] Password  [facility] 09/10/2014, 5:05 PM

## 2014-09-10 NOTE — Patient Instructions (Signed)
You have a large left pleural effusion. This needs to be removed in the emergency department so please go directly to [facility] long emergency room

## 2014-09-11 ENCOUNTER — Encounter (HOSPITAL_COMMUNITY): Payer: Self-pay | Admitting: Radiology

## 2014-09-11 ENCOUNTER — Inpatient Hospital Stay (HOSPITAL_COMMUNITY): Payer: Medicare Other

## 2014-09-11 DIAGNOSIS — I517 Cardiomegaly: Secondary | ICD-10-CM

## 2014-09-11 LAB — GLUCOSE, SEROUS FLUID: Glucose, Fluid: 105 mg/dL

## 2014-09-11 LAB — BASIC METABOLIC PANEL
Anion gap: 8 (ref 5–15)
BUN: 24 mg/dL — AB (ref 6–23)
CALCIUM: 8.6 mg/dL (ref 8.4–10.5)
CO2: 29 mmol/L (ref 19–32)
CREATININE: 0.86 mg/dL (ref 0.50–1.10)
Chloride: 101 mmol/L (ref 96–112)
GFR calc Af Amer: 79 mL/min — ABNORMAL LOW (ref >90)
GFR, EST NON AFRICAN AMERICAN: 68 mL/min — AB (ref >90)
Glucose, Bld: 109 mg/dL — ABNORMAL HIGH (ref 70–99)
Potassium: 3.5 mmol/L (ref 3.5–5.1)
Sodium: 138 mmol/L (ref 135–145)

## 2014-09-11 LAB — BODY FLUID CELL COUNT WITH DIFFERENTIAL
LYMPHS FL: 74 %
Monocyte-Macrophage-Serous Fluid: 12 % — ABNORMAL LOW (ref 50–90)
NEUTROPHIL FLUID: 14 % (ref 0–25)
Total Nucleated Cell Count, Fluid: UNDETERMINED cu mm (ref 0–1000)

## 2014-09-11 LAB — PROTEIN, BODY FLUID: Total protein, fluid: 4.2 g/dL

## 2014-09-11 LAB — CBC
HCT: 32.3 % — ABNORMAL LOW (ref 36.0–46.0)
Hemoglobin: 10.4 g/dL — ABNORMAL LOW (ref 12.0–15.0)
MCH: 29.9 pg (ref 26.0–34.0)
MCHC: 32.2 g/dL (ref 30.0–36.0)
MCV: 92.8 fL (ref 78.0–100.0)
Platelets: 198 10*3/uL (ref 150–400)
RBC: 3.48 MIL/uL — ABNORMAL LOW (ref 3.87–5.11)
RDW: 13.8 % (ref 11.5–15.5)
WBC: 7.5 10*3/uL (ref 4.0–10.5)

## 2014-09-11 LAB — LACTATE DEHYDROGENASE, PLEURAL OR PERITONEAL FLUID: LD, Fluid: 140 U/L — ABNORMAL HIGH (ref 3–23)

## 2014-09-11 MED ORDER — INFLUENZA VAC SPLIT QUAD 0.5 ML IM SUSY
0.5000 mL | PREFILLED_SYRINGE | INTRAMUSCULAR | Status: DC
  Filled 2014-09-11 (×2): qty 0.5

## 2014-09-11 MED ORDER — IOHEXOL 350 MG/ML SOLN
100.0000 mL | Freq: Once | INTRAVENOUS | Status: AC | PRN
  Administered 2014-09-11: 100 mL via INTRAVENOUS

## 2014-09-11 MED ORDER — HYDROCOD POLST-CHLORPHEN POLST 10-8 MG/5ML PO LQCR: 5.0000 mL | Freq: Two times a day (BID) | ORAL | Status: DC | PRN

## 2014-09-11 NOTE — Procedures (Signed)
Successful US guided left thoracentesis. Yielded 900 mL of hazy yellow fluid. The pt began feeling weak and lightheaded. Procedure was terminated and pt laid in the Trendelenberg position. SBP from 133 to 114, but promptly returned to 133 after a few minutes.  Specimen was sent for labs. CXR ordered.  Ascencion Dike PA-C 09/11/2014 2:00 PM

## 2014-09-11 NOTE — Progress Notes (Addendum)
Patient ID: Linda [name], female   DOB: 06/13/1946, 69 y.o.   MRN: [id number]  [facility] PROGRESS NOTE  Linda [name] LOV:[id number] DOB: 09-05-1945 DOA: 09/10/2014 PCP: Leeanne Rio, PA-C   Brief narrative:    69 y.o. female with a PMH of bilateral breast cancer diagnosed 07/2011, s/p chemotherapy with doxorubicin/ cyclophosphamide followed by paclitaxel and trastuzumab completed 12/09/2011 and bilateral mastectomy 01/21/12 and postmastectomy XRT completed 04/15/12 on treatment with anastrozole, followed by Dr. Jana Hakim who presented with a chief complaint of a 4 week history non-productive cough and a 2 week history of worsening dypnea. The patient saw her PCP about a week ago, was put on cough medicine at that time but did not experience any significant relief of her symptoms.In ED, CXR showed a left sided pleural effusion with a slightly enlarged heart. Oxygen saturation noted to be 90% on 2 L [place].  Assessment/Plan:    Principal Problem:  Acute hypoxic respiratory failure secondary to pleural effusion on left - Differential includes CHF secondary to h/o cardiotoxic chemotherapy, parapneumonic effusion versus malignant pleural effusion. - Last ECHO done 11/04/12, EF 88-41%, grade I diastolic dysfunction - appreciate IR assistance, pt is now s/p left thoracentesis, 900 ml of fluid removed and sent for analysis  - ABX were not started on admission as this was not thought to be an infectious process - pt has been afebrile on admission and WBC minimally elevated - pt has remained afebrile and WBL is now WNL, so will continue to hold off on ABX - d-dimer > 6, will proceed with CT angio chest to rule out PE  - 2 D ECHO pending   Active Problems:   Acute on chronic diastolic CHF - 2 D ECHO pending - weights is 110 kg this AM - continue to monitor daily weights, strict I's and O's   Cough - Hold ACE-I for now.  - provide antitussives as needed    Hypertension - Hold  lisinopril/HCTZ for now - reasonable inpatient control off antihypertensive regimen    GERD (gastroesophageal reflux disease) - Continue H2 blocker.   Bilateral breast cancer - Will notify Dr. Jana Hakim of the patient's admission.    Morbid obesity - pt meets criteria with BMI of 36 and presence of comorbid conditions including HTN, diastolic CHF, breast cancer    DVT prophylaxis - Lovenox SQ  Code Status: Full. Family Communication: patient, Shanon Brow, son, [phone] Disposition Plan: Home when stable.  IV access:  Peripheral IV  Procedures and diagnostic studies:    CXR 09/11/2014   Slight decrease in left pleural effusion.     CXR 09/10/2014  Left effusion with left lower lobe and inferior lingular atelectasis/consolidation. Heart slightly enlarged.     Left side thoracentesis 3/28 - 900 mL of fluid removed and sent for analysis, done by IR  Medical Consultants:  IR for thoracentesis   Other Consultants:  None   IAnti-Infectives:   None   Faye Ramsay, MD  [facility] Pager [phone]  If 7PM-7AM, please contact night-coverage [email or web address] Password [email or web address] 09/11/2014, 2:50 PM   LOS: 1 day   HPI/Subjective: No events overnight.   Objective: Filed Vitals:   09/10/14 2114 09/11/14 0602 09/11/14 1345 09/11/14 1354  BP: 143/67 146/72 133/60 114/46  Pulse: 102 98    Temp: 98.9 F (37.2 C) 98.2 F (36.8 C)    TempSrc: Oral Oral    Resp: 18 18    Height:      Weight:  SpO2: 94% 90%      Intake/Output Summary (Last 24 hours) at 09/11/14 1450 Last data filed at 09/11/14 5809  Gross per 24 hour  Intake      0 ml  Output    302 ml  Net   -302 ml    Exam:   General:  Pt is alert, follows commands appropriately, not in acute distress  Cardiovascular: Regular rate and rhythm, no rubs, no gallops  Respiratory: Bilateral rhonchi and crackles on the left side, mostly at bases  Abdomen: Soft, non tender, non distended, bowel sounds present, no  guarding  Extremities: pulses DP and PT palpable bilaterally  Neuro: Grossly nonfocal  Data Reviewed: Basic Metabolic Panel:  Recent Labs Lab 09/10/14 1513 09/11/14 0435  NA 134* 138  K 3.6 3.5  CL 98 101  CO2 26 29  GLUCOSE 99 109*  BUN 25* 24*  CREATININE 1.02 0.86  CALCIUM 8.4 8.6   Liver Function Tests:  Recent Labs Lab 09/10/14 1513  AST 39*  ALT 26  ALKPHOS 99  BILITOT 0.8  PROT 7.1  ALBUMIN 2.9*   CBC:  Recent Labs Lab 09/10/14 1236 09/10/14 1513 09/11/14 0435  WBC 11.6* 10.2 7.5  NEUTROABS  --  8.2*  --   HGB 11.0* 10.5* 10.4*  HCT 35.1* 33.2* 32.3*  MCV 90.7 93.5 92.8  PLT  --  207 198   Scheduled Meds: . anastrozole  1 mg Oral Daily  . dextromethorphan  30 mg Oral BID  . enoxaparin  injection  55 mg Subcutaneous Q24H  . famotidine  20 mg Oral BID  . multivitamins with iron  1 tablet Oral Daily   Continuous Infusions:

## 2014-09-11 NOTE — Progress Notes (Signed)
Echocardiogram 2D Echocardiogram has been performed.  Ludwika Rodd M 09/11/2014, 9:30 AM 

## 2014-09-12 ENCOUNTER — Telehealth: Payer: Self-pay | Admitting: *Deleted

## 2014-09-12 ENCOUNTER — Other Ambulatory Visit: Payer: Self-pay | Admitting: Oncology

## 2014-09-12 LAB — BASIC METABOLIC PANEL
Anion gap: 9 (ref 5–15)
BUN: 18 mg/dL (ref 6–23)
CALCIUM: 8.5 mg/dL (ref 8.4–10.5)
CHLORIDE: 101 mmol/L (ref 96–112)
CO2: 27 mmol/L (ref 19–32)
CREATININE: 0.78 mg/dL (ref 0.50–1.10)
GFR, EST NON AFRICAN AMERICAN: 84 mL/min — AB (ref >90)
Glucose, Bld: 111 mg/dL — ABNORMAL HIGH (ref 70–99)
Potassium: 3.3 mmol/L — ABNORMAL LOW (ref 3.5–5.1)
SODIUM: 137 mmol/L (ref 135–145)

## 2014-09-12 LAB — CBC
HEMATOCRIT: 31.6 % — AB (ref 36.0–46.0)
HEMOGLOBIN: 10 g/dL — AB (ref 12.0–15.0)
MCH: 29.4 pg (ref 26.0–34.0)
MCHC: 31.6 g/dL (ref 30.0–36.0)
MCV: 92.9 fL (ref 78.0–100.0)
Platelets: 216 10*3/uL (ref 150–400)
RBC: 3.4 MIL/uL — ABNORMAL LOW (ref 3.87–5.11)
RDW: 13.9 % (ref 11.5–15.5)
WBC: 7.3 10*3/uL (ref 4.0–10.5)

## 2014-09-12 MED ORDER — DEXTROMETHORPHAN POLISTIREX 30 MG/5ML PO LQCR: 30.0000 mg | Freq: Two times a day (BID) | ORAL | Status: DC

## 2014-09-12 MED ORDER — HYDROCOD POLST-CHLORPHEN POLST 10-8 MG/5ML PO LQCR: 5.0000 mL | Freq: Two times a day (BID) | ORAL | Status: DC | PRN

## 2014-09-12 MED ORDER — POTASSIUM CHLORIDE CRYS ER 20 MEQ PO TBCR
40.0000 meq | EXTENDED_RELEASE_TABLET | Freq: Once | ORAL | Status: AC
  Administered 2014-09-12: 40 meq via ORAL
  Filled 2014-09-12: qty 2

## 2014-09-12 NOTE — Telephone Encounter (Signed)
Per Dr. Doris Cheadle, he can see patient on 09/18/14 @ 11:00 am. No labs needed at this visit. This RN left a message on patient's cell phone regarding appointment date and time.

## 2014-09-12 NOTE — Discharge Summary (Signed)
Physician Discharge Summary  Linda [name] RXV:[id number] DOB: February 10, 1946 DOA: 09/10/2014  PCP: Linda Rio, PA-C  Admit date: 09/10/2014 Discharge date: 09/12/2014  Recommendations for Outpatient Follow-up:  1. Pt will need to follow up with PCP in 2-3 weeks post discharge 2. Please obtain BMP to evaluate electrolytes and kidney function, potassium level 3. Pt given K-dur 40 MEQ prior to discharge  4. Please also check CBC to evaluate Hg and Hct levels 5. Please note that pt insisted on going home today and I have agreed with her, she was made aware that pleural fluid analysis results are not available and needs to be followed up  6. I have asked oncology center to have Dr. Jana [name] see pt earlier than her appointment in May that was already scheduled so that the fluid results can be reviewed  7. I have discussed CT chest reading in detail with pt and will defer further conversation and recommendations to oncologist Dr. Jana [name]  Discharge Diagnoses:  Principal Problem:   Pleural effusion on left Active Problems:   Cough   Hypertension   GERD (gastroesophageal reflux disease)   Bilateral breast cancer   Discharge Condition: Stable  Diet recommendation: Heart healthy diet discussed in details    Brief narrative:    69 y.o. female with a PMH of bilateral breast cancer diagnosed 07/2011, s/p chemotherapy with doxorubicin/ cyclophosphamide followed by paclitaxel and trastuzumab completed 12/09/2011 and bilateral mastectomy 01/21/12 and postmastectomy XRT completed 04/15/12 on treatment with anastrozole, followed by Dr. Jana [name] who presented with a chief complaint of a 4 week history non-productive cough and a 2 week history of worsening dypnea. The patient saw her PCP about a week ago, was put on cough medicine at that time but did not experience any significant relief of her symptoms.In ED, CXR showed a left sided pleural effusion with a slightly enlarged heart. Oxygen  saturation noted to be 90% on 2 L [place].  Assessment/Plan:    Principal Problem:  Acute hypoxic respiratory failure secondary to pleural effusion on left and acute on chronic diastolic CHF  - please note that 900 cc of pleural fluid has been removed and analysis is pending upon discharge - pt made aware that this needs to be follow up on  - Last ECHO done 11/04/12, EF 50-93%, grade I diastolic dysfunction, repeat 2 D ECHO essentially unchanged 09/11/2014  - ABX were not started on admission as this was not thought to be an infectious process - pt has been afebrile on admission and WBC WNL  Active Problems:  Acute on chronic diastolic CHF - 2 D ECHO unchanged from the one in 2014, with EF 55% and grade I diastolic CHG - weights has trended down since admission from 110 kg -- > 108 kg - pt advised to monitor her weight closely and to reports any weight gain over 4 lbs in 24 - 48 hours to her PCP for recommendations  - no need for lasix upon admission    Cough - provide antitussives as needed  - pt reports cough is much better with antitussives provided inpatient  - if cough recurs would consider holding ARB's    Hypertension - continue home medical regimen    GERD (gastroesophageal reflux disease) - Continue H2 blocker.   Bilateral breast cancer - pt to follow up with oncologist    Morbid obesity - pt meets criteria with BMI of 36 and presence of comorbid conditions including HTN, diastolic CHF, breast cancer    Anemia  of chronic disease, malignancy   - no signs of bleeding while inpatient   Code Status: Full. Family Communication: patient, Linda [name], son, [phone] Disposition Plan: Home .  IV access:  Peripheral IV  Procedures and diagnostic studies:   CXR 09/11/2014 Slight decrease in left pleural effusion.   CXR 09/10/2014 Left effusion with left lower lobe and inferior lingular atelectasis/consolidation. Heart slightly enlarged.   Left side  thoracentesis 3/28 - 900 mL of fluid removed and sent for analysis, done by IR  Medical Consultants:  IR for thoracentesis   Other Consultants:  None   IAnti-Infectives:   None      Discharge Exam: Filed Vitals:   09/12/14 0600  BP: 154/80  Pulse: 90  Temp: 98.6 F (37 C)  Resp: 18   Filed Vitals:   09/11/14 1354 09/11/14 2150 09/12/14 0600 09/12/14 0713  BP: 114/46 135/58 154/80   Pulse:  93 90   Temp:  98.5 F (36.9 C) 98.6 F (37 C)   TempSrc:  Oral Oral   Resp:  18 18   Height:      Weight:    108.3 kg (238 lb 12.1 oz)  SpO2:  93% 93%     General: Pt is alert, follows commands appropriately, not in acute distress Cardiovascular: Regular rate and rhythm, S1/S2 +, no murmurs, no rubs, no gallops Respiratory: improved air movement bilaterally with minimal crackles at bases  Abdominal: Soft, non tender, non distended, bowel sounds +, no guarding Extremities: no edema, no cyanosis, pulses palpable bilaterally DP and PT Neuro: Grossly nonfocal  Discharge Instructions  Discharge Instructions    Diet - low sodium heart healthy    Complete by:  As directed      Increase activity slowly    Complete by:  As directed             Medication List    STOP taking these medications        MOVIPREP 100 G Solr  Generic drug:  peg 3350 powder      TAKE these medications        anastrozole 1 MG tablet  Commonly known as:  ARIMIDEX  Take 1 mg by mouth daily.     b complex vitamins tablet  Take 1 tablet by mouth daily.     benzonatate 200 MG capsule  Commonly known as:  TESSALON  Take 1 capsule (200 mg total) by mouth 3 (three) times daily as needed for cough.     chlorpheniramine-HYDROcodone 10-8 MG/5ML Lqcr  Commonly known as:  TUSSIONEX  Take 5 mLs by mouth every 12 (twelve) hours as needed for cough.     dextromethorphan 30 MG/5ML liquid  Commonly known as:  DELSYM  Take 5 mLs (30 mg total) by mouth 2 (two) times daily.      losartan-hydrochlorothiazide 50-12.5 MG per tablet  Commonly known as:  HYZAAR  TAKE 1 TABLET BY MOUTH DAILY.     multivitamins with iron Tabs tablet  Take 1 tablet by mouth daily.     ranitidine 75 MG tablet  Commonly known as:  ZANTAC  Take 75 mg by mouth 2 (two) times daily.           Follow-up Information    Follow up with Linda Ramsay, MD.   Specialty:  Internal Medicine   Why:  call my cell phone [phone]   Contact information:   [address] [place] [address] [phone]  Follow up with Chauncey Cruel, MD On 09/12/2014.   Specialty:  Oncology   Contact information:   [place] [address] [phone]        The results of significant diagnostics from this hospitalization (including imaging, microbiology, ancillary and laboratory) are listed below for reference.     Microbiology: Recent Results (from the past 240 hour(s))  Body fluid culture     Status: None (Preliminary result)   Collection Time: 09/11/14  1:51 PM  Result Value Ref Range Status   Specimen Description PLEURAL  Final   Special Requests PLEURAL  Final   Gram Stain   Final    FEW WBC PRESENT, PREDOMINANTLY MONONUCLEAR NO ORGANISMS SEEN Performed at [facility]    Culture PENDING  Incomplete   Report Status PENDING  Incomplete     Labs: Basic Metabolic Panel:  Recent Labs Lab 09/10/14 1513 09/11/14 0435 09/12/14 0552  NA 134* 138 137  K 3.6 3.5 3.3*  CL 98 101 101  CO2 26 29 27   GLUCOSE 99 109* 111*  BUN 25* 24* 18  CREATININE 1.02 0.86 0.78  CALCIUM 8.4 8.6 8.5   Liver Function Tests:  Recent Labs Lab 09/10/14 1513  AST 39*  ALT 26  ALKPHOS 99  BILITOT 0.8  PROT 7.1  ALBUMIN 2.9*   CBC:  Recent Labs Lab 09/10/14 1236 09/10/14 1513 09/11/14 0435 09/12/14 0552  WBC 11.6* 10.2 7.5 7.3  NEUTROABS  --  8.2*  --   --   HGB 11.0* 10.5* 10.4* 10.0*  HCT 35.1* 33.2* 32.3* 31.6*  MCV 90.7  93.5 92.8 92.9  PLT  --  207 198 216    BNP (last 3 results)  Recent Labs  09/10/14 1740  BNP 80.9   SIGNED: Time coordinating discharge: Over 30 minutes  Linda Ramsay, MD  [facility] 09/12/2014, 9:03 AM Pager [phone]  If 7PM-7AM, please contact night-coverage [email or web address] Password [email or web address]

## 2014-09-12 NOTE — Telephone Encounter (Signed)
PT. WAS ADMITTED WITH SHORTNESS OF BREATH AND LEFT PLEURAL EFFUSION WHICH WAS TAPPED WITH A LITER OF FLUID. SHE NEEDS TO BE SEEN BY DR.MAGRINAT IN ONE TO TWO WEEKS. THIS NOTE WAS ROUTED TO DR.MAGRINAT, STACEY CAMP,RN, AND VAL DODD,RN.

## 2014-09-12 NOTE — Telephone Encounter (Signed)
Unable to reach patient at time of TCM Call. Left message for patient to return call when available.  

## 2014-09-13 ENCOUNTER — Ambulatory Visit: Payer: Medicare Other | Admitting: Physician Assistant

## 2014-09-13 NOTE — Telephone Encounter (Signed)
LMOM with contact name and number for return call RE: Hospital F/U rescheduling [holding slot in schedule for pt today] per provider instructions/SLS

## 2014-09-15 LAB — BODY FLUID CULTURE: Culture: NO GROWTH

## 2014-09-16 NOTE — Progress Notes (Signed)
ID: Linda [name]   DOB: 1945/10/06  MR#: [id number]  OZH#:[id number]  PCP: Leeanne Rio, PA-C GYN: SU: Coralie Keens OTHER MD: Crissie Reese, Linna Hoff Bensimhon  CHIEF COMPLAINT:  Bilateral Breast Cancers  CURRENT TREATMENT: Anastrozole, trastuzumab/pertuzumab   HISTORY OF PRESENT ILLNESS: From the original intake note:  The patient noted a small amount of drainage from her left breast December of 2012. She brought it to her gynecologist's attention in January of 2013 and was set up for bilateral diagnostic mammography at the [facility] Center July 24, 2011. This was the patient's first ever mammogram. It showed a large irregular mass in the left retroareolar region extending to the nipple, with nipple retraction and skin thickening. This measured approximately 8.4 cm. It was associated with pleomorphic microcalcifications. By exam there was moderate distortion and retraction of the nipple with a large palpable ill-defined area in the retroareolar region. In the right right breast there was also a 2 cm hard mass palpated. Ultrasound of the right breast mass showed a complex cystic/solid area measuring 1.9 cm. Ultrasound of the right axilla was negative. Ultrasound of the left breast showed a large hypoechoic mass measuring at least 3.8 cm. The left axilla showed several adjacent abnormal appearing lymph nodes.  With this information biopsy of the right and left breast masses were obtained the same day, and showed ([id number])   (a) on the right, and invasive ductal carcinoma with papillary features, estrogen and progesterone receptor positive, HER-2 negative, with an MIB-1 of 10%.   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 The MIB-1 was 60% for the left-sided tumor.   With this information the patient was presented at the multidisciplinary breast cancer conference 08/06/2011.  Subsequent evaluation and treatments are as detailed below.  INTERVAL HISTORY: Linda [name] returns today for followup of her bilateral breast cancers. Since her last visit she presented to the emergency room with a history of cough and worsening shortness of breath. ACT NGO was obtained 09/10/2014, and did not show a pulmonary embolus, but did show a new per cardio effusion and some mediastinal and hilar adenopathy. Thoracentesis was performed 09/11/2014 and showed ([id number]) malignant cells in the fluid, which were HER-2 positive, with a signals ratio 4.12 and the number per cell being 5.51. The patient is here to discuss her newly found stage IV disease.  REVIEW OF SYSTEMS: Linda [name] still has a significant cough, despite the [employer] These are helping, but they also make her feel very tired and sleepy in the constipated her. She does not have any pleurisy. There have been no fever rash or bleeding problems. She has lost a little weight, has no appetite, and her sense of taste appears altered. Otherwise a detailed review of systems today was noncontributory  PAST MEDICAL HISTORY: Past Medical History  Diagnosis Date  . Cancer 08-13-11    07-31-11-Dx. Bilarteral Breast cancer-left greater than rt.  . Hematuria, undiagnosed cause 08-13-11    Being evaluated by [facility] 08-14-11  . Hypertension   . Bronchitis     hx  . GERD (gastroesophageal reflux disease)     doing well  . 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 Seroma 02/04/12    right breast  200cc removed  erythema on right side  . Seasonal  allergies   . History of radiation therapy 02/20/12-04/15/12    left breast,total 60.4 Gy  . Wears partial dentures     wears upper and lower partial  . Allergy   . Blood transfusion without reported diagnosis   . Radiation-induced dermatitis 03/26/2012    Using radioplex cream, plus  neosporin.   . Cancer of central portion of left female breast 08/01/2011    PAST SURGICAL HISTORY: Past Surgical History  Procedure Laterality Date  . Child birth  08-13-11    x3 -NVD  . Portacath placement  08/15/2011    Procedure: INSERTION PORT-A-CATH;  Surgeon: Harl Bowie, MD;  Location: [facility];  Service: General;  Laterality: N/A;  . Mastectomy w/ sentinel node biopsy  01/21/2012    Procedure: MASTECTOMY WITH SENTINEL LYMPH NODE BIOPSY;  Surgeon: Harl Bowie, MD;  Location: [place];  Service: General;  Laterality: Bilateral;  Left modified radical mastectomy, Rt mastectomy with Sentinel lymphnode biospy  . Breast surgery    . Port-a-cath removal Right 12/22/2012    Procedure: REMOVAL PORT-A-CATH;  Surgeon: Harl Bowie, MD;  Location: [place];  Service: General;  Laterality: Right;    FAMILY HISTORY Family History  Problem Relation Age of Onset  . Heart disease Mother   . Cancer Mother 40    breast, , 71 deceased  . Heart attack Father   . Cancer Sister 27    breast  . Colon cancer Neg Hx   The patient's father died from a heart attack at the age of 39. The patient's mother died from apparently heart problems at the age of 2. The patient had no brothers. She has 3 sisters. One of her sisters was diagnosed with breast cancer in her mid 62s. The patient does not know if his sister was ever genetically tested. The patient's mother had a mastectomy at the age of 62, presumably for breast cancer. There is no other history of breast or in cancer in the family to her knowledge.   GYNECOLOGIC HISTORY:  She does not recall when she had menarche. She had her first child at age 13. She is GX P3. She underwent menopause in her mid 80s. She never took hormone replacement.   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 She  moved to [place] about 2 years ago but has a Games developer in [place]. Son Gerald Stabs lives in [place] and works as a Airline pilot. His wife is a Marine scientist. Son Shanon Brow lives at [place] and is a Advertising account executive in addition to having the [employer]. The patient attends a local [employer] here   ADVANCED DIRECTIVES: Not in place  HEALTH MAINTENANCE: History  Substance Use Topics  . Smoking status: Never Smoker   . Smokeless tobacco: Never Used  . Alcohol Use: 0.0 oz/week    0 Standard drinks or equivalent per week     Comment: rare- occ.     Colonoscopy: Never  PAP: Feb 2013  Bone density: November 2013, normal  Lipid panel:   Allergies  Allergen Reactions  . Ace Inhibitors Cough    Current Outpatient Prescriptions  Medication Sig Dispense Refill  . anastrozole (ARIMIDEX) 1 MG tablet Take 1 mg by mouth daily.    [name] b complex vitamins tablet Take 1 tablet by mouth daily.    . benzonatate (  TESSALON) 200 MG capsule Take 1 capsule (200 mg total) by mouth 3 (three) times daily as needed for cough. 20 capsule 0  . chlorpheniramine-HYDROcodone (TUSSIONEX) 10-8 MG/5ML LQCR Take 5 mLs by mouth every 12 (twelve) hours as needed for cough. 120 mL 1  . dextromethorphan (DELSYM) 30 MG/5ML liquid Take 5 mLs (30 mg total) by mouth 2 (two) times daily. 89 mL 0  . losartan-hydrochlorothiazide (HYZAAR) 50-12.5 MG per tablet TAKE 1 TABLET BY MOUTH DAILY. 30 tablet 3  . Multiple Vitamins-Iron (MULTIVITAMINS WITH IRON) TABS Take 1 tablet by mouth daily.    [name] omeprazole (PRILOSEC) 40 MG capsule Take 1 capsule (40 mg total) by mouth daily. 90 capsule 4  . ranitidine (ZANTAC) 75 MG tablet Take 75 mg by mouth 2 (two) times daily.     No current facility-administered medications for this visit.    OBJECTIVE: [name] who appears stated age 10 Vitals:   09/18/14 1106  BP: 127/59  Pulse: 108  Temp: 98.8 F (37.1 C)  Resp: 18     Body mass index is 35.76 kg/(m^2).     ECOG FS: 1 Filed Weights   09/18/14 1106  Weight: 235 lb 1.6 oz (106.641 kg)   Sclerae unicteric, EOMs intact Oropharynx clear, dentition in good repair No cervical or supraclavicular adenopathy Lungs no rales or rhonchi Heart regular rate and rhythm Abd soft, obese, nontender, positive bowel sounds MSK no focal spinal tenderness, no upper extremity lymphedema Neuro: nonfocal, well oriented, appropriate affect Breasts: Status post bilateral mastectomies. Exam today was deferred    LAB RESULTS: Lab Results  Component Value Date   WBC 7.3 09/12/2014   NEUTROABS 8.2* 09/10/2014   HGB 10.0* 09/12/2014   HCT 31.6* 09/12/2014   MCV 92.9 09/12/2014   PLT 216 09/12/2014      Chemistry      Component Value Date/Time   NA 137 09/12/2014 0552   NA 142 05/15/2014 0857   K 3.3* 09/12/2014 0552   K 3.5 05/15/2014 0857   CL 101 09/12/2014 0552   CL 107 11/01/2012 1045   CO2 27 09/12/2014 0552   CO2 28 05/15/2014 0857   BUN 18 09/12/2014 0552   BUN 10.0 05/15/2014 0857   CREATININE 0.78 09/12/2014 0552   CREATININE 0.7 05/15/2014 0857   CREATININE 0.61 06/30/2013 0959      Component Value Date/Time   CALCIUM 8.5 09/12/2014 0552   CALCIUM 9.6 05/15/2014 0857   ALKPHOS 99 09/10/2014 1513   ALKPHOS 89 05/15/2014 0857   AST 39* 09/10/2014 1513   AST 12 05/15/2014 0857   ALT 26 09/10/2014 1513   ALT 8 05/15/2014 0857   BILITOT 0.8 09/10/2014 1513   BILITOT 0.46 05/15/2014 0857       STUDIES: Dg Chest 1 View  09/11/2014   CLINICAL DATA:  Left-sided pleural effusion status post thoracentesis  EXAM: CHEST  1 VIEW  COMPARISON:  09/10/2014  FINDINGS: Cardiac shadow is enlarged. A left-sided pleural effusion is again noted but smaller in size. No pneumothorax is seen.  IMPRESSION: Slight decrease in left pleural effusion.   Electronically Signed   By: Inez Catalina M.D.   On: 09/11/2014 14:26   Dg Chest 2 View  09/10/2014   CLINICAL DATA:  Two-month history of nonproductive cough   EXAM: CHEST  2 VIEW  COMPARISON:  None.  FINDINGS: There is a sizable left pleural effusion with atelectatic change/consolidation in portions of the left lower lobe and lingula. Lungs elsewhere  clear. The heart is slightly enlarged with pulmonary vascularity within normal limits. No adenopathy. There is degenerative change in thoracic spine. There are surgical clips in left axillary region.  IMPRESSION: Left effusion with left lower lobe and inferior lingular atelectasis/consolidation. Right lung clear. Heart slightly enlarged.   Electronically Signed   By: Lowella Grip III M.D.   On: 09/10/2014 13:42   Ct Angio Chest Pe W/cm &/or Wo Cm  09/11/2014   CLINICAL DATA:  Cough for 2 weeks with shortness of breath. Evaluate for pulmonary embolism. History of breast cancer diagnosed in 2013.  EXAM: CT ANGIOGRAPHY CHEST WITH CONTRAST  TECHNIQUE: Multidetector CT imaging of the chest was performed using the standard protocol during bolus administration of intravenous contrast. Multiplanar CT image reconstructions and MIPs were obtained to evaluate the vascular anatomy.  CONTRAST:  15m OMNIPAQUE IOHEXOL 350 MG/ML SOLN  COMPARISON:  08/13/2011  FINDINGS: THORACIC INLET/BODY WALL:  Bilateral mastectomy. There is distortion in the bilateral axillary, without clear recurrent mass.  MEDIASTINUM:  No cardiomegaly. New pericardial effusion and/or thickening, most notable around the apex. No related chamber distortion. No acute aortic findings. No convincing pulmonary embolism.  There is new mediastinal and hilar lymphadenopathy with unusual asymmetric coarse calcification in left hilar lymph nodes. The nodes are large enough to mildly narrow the hilar bronchi. Index lower right peritracheal lymph node measures 17 mm.  LUNG WINDOWS:  Small layering left pleural effusion without pleural nodularity. This has been recently sampled. There is no complicating pneumothorax. Extensive atelectasis in the left lower lobe. A band of  subpleural opacity in the left upper lobe is most consistent with radiation fibrosis.  UPPER ABDOMEN:  No acute findings.  OSSEOUS:  No acute fracture.  No suspicious lytic or blastic lesions.  Review of the MIP images confirms the above findings.  IMPRESSION: 1. Negative for pulmonary embolism. 2. Mediastinal and bilateral hilar lymphadenopathy, new from staging CT in 2013. Intrathoracic spread of breast cancer or lymphoma is the primary concern, but pattern and dystrophic calcifications raises the possibility of granulomatous disease, including post chemotherapy sarcoid. 3. Small pericardial and left pleural effusion, possibly radiation related. There is moderate radiation fibrosis in the left upper lobe.   Electronically Signed   By: JMonte FantasiaM.D.   On: 09/11/2014 16:47   [place]oreaThoracentesis Asp Pleural Space W/img Guide  09/11/2014   CLINICAL DATA:  Shortness of breath, left-sided pleural effusion. Request diagnostic and therapeutic left thoracentesis  EXAM: ULTRASOUND GUIDED LEFT THORACENTESIS  COMPARISON:  None.  PROCEDURE: An ultrasound guided thoracentesis was thoroughly discussed with the patient and questions answered. The benefits, risks, alternatives and complications were also discussed. The patient understands and wishes to proceed with the procedure. Written consent was obtained.  Ultrasound was performed to localize and mark an adequate pocket of fluid in the left chest. The area was then prepped and draped in the normal sterile fashion. 1% Lidocaine was used for local anesthesia. Under ultrasound guidance a 19 gauge Yueh catheter was introduced. Thoracentesis was performed. The catheter was removed and a dressing applied.  COMPLICATIONS: The patient developed a near syncopal episode complaining of weakness and lightheadedness. The procedure was terminated at this point. Patient was placed in a Trendelenburg position. Systolic blood pressure dropped from 133 to 114 and promptly returned to the  130s after just a minute or so. Patient symptoms resolved and she felt much better.  FINDINGS: A total of approximately 900 mL of hazy, yellow pleural fluid was removed. A  fluid sample wassent for laboratory analysis.  IMPRESSION: Successful ultrasound guided left thoracentesis yielding 900 mL of pleural fluid.  Read by: Ascencion Dike PA-C   Electronically Signed   By: Markus Daft M.D.   On: 09/11/2014 14:29     ASSESSMENT: 69 y.o.  [place] woman   (1)  status post bilateral breast biopsies 07/24/2011, showing,      (a) on the right, a clinical T2 N0 papillary/ductal breast cancer, estrogen and progesterone receptor positive, HER-2 negative, with an MIB-1 of 10%;     (b) on the left, a clinical T3 N1, stage IIIA invasive ductal carcinoma, grade 3, triple positive, with an MIB-1 of 60%.  (2)  Status post 4 dose dense cycles of doxorubicin/ cyclophosphamide followed by 4 dose dense cycles of paclitaxel and trastuzumab completed 12/09/2011  (3) the trastuzumab was continued for a total of one year (to 11/01/2012). Final echo on 11/04/2012 showing a well preserved ejection fraction of 55-60%.  (4) s/p bilateral mastectomies 01/21/2012 showing   (a) on the Right, an 8 mm invasive papillary carcinoma, grade 1, ypT1b ypN0   (b) on the Left, multiple microscopic foci of residual  Invasive ductal carcinoma with evidence of dermal lymphatic involvement, pyT1a/T4 pyN0  (5)  Postmastectomy radiation, completed 04/15/2012  (6) Started anastrazole 04/16/2012; normal dexa scan 05/19/2014 at the [facility] 09/11/2014 (7) CT angiogram 09/11/2014 shows new left pericardial effusion and new mediastinal and hilar lymphadenopathy; there were no suspicious upper abdominal findings and no bony findings. Cytology from the left effusion 09/11/2014 ([id number]) shows malignant cells which are HER-2 positive  (8) to start trastuzumab/pertuzumab 09/26/2014, to be repeated every 21 days  indefinitely  (a) echocardiogram 09/11/2014 shows a well preserved ejection fraction.   PLAN: Linda [name] now has stage IV breast cancer. Luckily we don't see involvement of the lung itself or of the liver. We also don't see evidence of bone involvement by CT scan. We do need to complete the staging with a brain MRI and a bone scan, which I hope will be negative.  This is incurable disease. Hopefully we will be able to turn into a chronic condition with anti-HER-2 immunotherapy. She received trastuzumab before with no complications. We're going to go with trastuzumab and pertuzumab and after 4 cycles restage either with a repeat CT of the chest or a PET scan. If we document response the plan will be to continue those drugs indefinitely. Otherwise we will switch to T-DM 1.  I have a suspicion that her cough actually is not related to the left pleural effusion, but to reflux. She is already on Zantac. We are adding omeprazole and I have asked her to take it daily for the next month. Hopefully that will resolve the cough. That is actually the worst symptom that she is having right now.  I have requested a port to be placed within the next week. We are continuing the anastrozole to treat the estrogen receptor positive contralateral breast cancer.  She knows if her children would like to come with her for the next visit, which will be with her second dose of trastuzumab/pertuzumab, and we can further discuss her prognosis and treatment plan with them and present. Chauncey Cruel, MD    09/18/2014

## 2014-09-18 ENCOUNTER — Telehealth: Payer: Self-pay | Admitting: *Deleted

## 2014-09-18 ENCOUNTER — Ambulatory Visit (HOSPITAL_BASED_OUTPATIENT_CLINIC_OR_DEPARTMENT_OTHER): Payer: Medicare Other | Admitting: Oncology

## 2014-09-18 ENCOUNTER — Telehealth: Payer: Self-pay | Admitting: Oncology

## 2014-09-18 VITALS — BP 127/59 | HR 108 | Temp 98.8°F | Resp 18 | Ht 68.0 in | Wt 235.1 lb

## 2014-09-18 DIAGNOSIS — Z853 Personal history of malignant neoplasm of breast: Secondary | ICD-10-CM

## 2014-09-18 DIAGNOSIS — J910 Malignant pleural effusion: Secondary | ICD-10-CM | POA: Diagnosis present

## 2014-09-18 MED ORDER — OMEPRAZOLE 40 MG PO CPDR: 40.0000 mg | DELAYED_RELEASE_CAPSULE | Freq: Every day | ORAL | Status: DC

## 2014-09-18 NOTE — Telephone Encounter (Signed)
Per staff message and POF I have scheduled appts. Advised scheduler of appts. JMW  

## 2014-09-18 NOTE — Telephone Encounter (Signed)
per pof to sch pt appt-adv pt that [facility] will call to sch SC[facility]-sent MW email to sch pt trmt-pt aware of 10/17/14 appt and will get updated copy of sch @ that time

## 2014-09-20 ENCOUNTER — Other Ambulatory Visit: Payer: Self-pay | Admitting: Radiology

## 2014-09-20 ENCOUNTER — Other Ambulatory Visit: Payer: Self-pay | Admitting: *Deleted

## 2014-09-21 ENCOUNTER — Encounter (HOSPITAL_COMMUNITY): Payer: Self-pay

## 2014-09-21 ENCOUNTER — Other Ambulatory Visit: Payer: Self-pay | Admitting: Oncology

## 2014-09-21 ENCOUNTER — Ambulatory Visit (HOSPITAL_COMMUNITY)
Admission: RE | Admit: 2014-09-21 | Discharge: 2014-09-21 | Disposition: A | Discharge status: Home / Self Care | Payer: Medicare Other | Source: Ambulatory Visit | Attending: Oncology | Admitting: Oncology

## 2014-09-21 DIAGNOSIS — Z9013 Acquired absence of bilateral breasts and nipples: Secondary | ICD-10-CM | POA: Insufficient documentation

## 2014-09-21 DIAGNOSIS — C782 Secondary malignant neoplasm of pleura: Secondary | ICD-10-CM | POA: Insufficient documentation

## 2014-09-21 DIAGNOSIS — J910 Malignant pleural effusion: Secondary | ICD-10-CM

## 2014-09-21 DIAGNOSIS — I10 Essential (primary) hypertension: Secondary | ICD-10-CM | POA: Insufficient documentation

## 2014-09-21 DIAGNOSIS — K219 Gastro-esophageal reflux disease without esophagitis: Secondary | ICD-10-CM | POA: Diagnosis not present

## 2014-09-21 DIAGNOSIS — Z853 Personal history of malignant neoplasm of breast: Secondary | ICD-10-CM | POA: Diagnosis not present

## 2014-09-21 DIAGNOSIS — C50919 Malignant neoplasm of unspecified site of unspecified female breast: Secondary | ICD-10-CM | POA: Diagnosis present

## 2014-09-21 DIAGNOSIS — Z923 Personal history of irradiation: Secondary | ICD-10-CM | POA: Diagnosis not present

## 2014-09-21 LAB — BASIC METABOLIC PANEL
ANION GAP: 10 (ref 5–15)
BUN: 15 mg/dL (ref 6–23)
CALCIUM: 9.4 mg/dL (ref 8.4–10.5)
CO2: 32 mmol/L (ref 19–32)
Chloride: 98 mmol/L (ref 96–112)
Creatinine, Ser: 0.79 mg/dL (ref 0.50–1.10)
GFR calc non Af Amer: 84 mL/min — ABNORMAL LOW (ref >90)
Glucose, Bld: 107 mg/dL — ABNORMAL HIGH (ref 70–99)
POTASSIUM: 4.5 mmol/L (ref 3.5–5.1)
Sodium: 140 mmol/L (ref 135–145)

## 2014-09-21 LAB — CBC WITH DIFFERENTIAL/PLATELET
Basophils Absolute: 0 10*3/uL (ref 0.0–0.1)
Basophils Relative: 0 % (ref 0–1)
Eosinophils Absolute: 0.1 10*3/uL (ref 0.0–0.7)
Eosinophils Relative: 1 % (ref 0–5)
HCT: 33.3 % — ABNORMAL LOW (ref 36.0–46.0)
HEMOGLOBIN: 10.3 g/dL — AB (ref 12.0–15.0)
LYMPHS PCT: 17 % (ref 12–46)
Lymphs Abs: 1.2 10*3/uL (ref 0.7–4.0)
MCH: 28.9 pg (ref 26.0–34.0)
MCHC: 30.9 g/dL (ref 30.0–36.0)
MCV: 93.5 fL (ref 78.0–100.0)
MONO ABS: 0.8 10*3/uL (ref 0.1–1.0)
Monocytes Relative: 11 % (ref 3–12)
Neutro Abs: 5.2 10*3/uL (ref 1.7–7.7)
Neutrophils Relative %: 71 % (ref 43–77)
PLATELETS: 408 10*3/uL — AB (ref 150–400)
RBC: 3.56 MIL/uL — AB (ref 3.87–5.11)
RDW: 13.6 % (ref 11.5–15.5)
WBC: 7.3 10*3/uL (ref 4.0–10.5)

## 2014-09-21 LAB — APTT: aPTT: 27 seconds (ref 24–37)

## 2014-09-21 LAB — PROTIME-INR
INR: 1.09 (ref 0.00–1.49)
Prothrombin Time: 14.3 seconds (ref 11.6–15.2)

## 2014-09-21 MED ORDER — FENTANYL CITRATE 0.05 MG/ML IJ SOLN
INTRAMUSCULAR | Status: AC | PRN
  Administered 2014-09-21 (×2): 25 ug via INTRAVENOUS

## 2014-09-21 MED ORDER — FENTANYL CITRATE 0.05 MG/ML IJ SOLN
INTRAMUSCULAR | Status: AC
  Filled 2014-09-21: qty 4

## 2014-09-21 MED ORDER — LIDOCAINE HCL 1 % IJ SOLN
INTRAMUSCULAR | Status: AC
  Filled 2014-09-21: qty 20

## 2014-09-21 MED ORDER — CEFAZOLIN SODIUM-DEXTROSE 2-3 GM-% IV SOLR
INTRAVENOUS | Status: AC
  Filled 2014-09-21: qty 50

## 2014-09-21 MED ORDER — HEPARIN SOD (PORK) LOCK FLUSH 100 UNIT/ML IV SOLN
INTRAVENOUS | Status: AC
  Filled 2014-09-21: qty 5

## 2014-09-21 MED ORDER — MIDAZOLAM HCL 2 MG/2ML IJ SOLN
INTRAMUSCULAR | Status: AC
  Filled 2014-09-21: qty 6

## 2014-09-21 MED ORDER — HEPARIN SOD (PORK) LOCK FLUSH 100 UNIT/ML IV SOLN
INTRAVENOUS | Status: AC | PRN
  Administered 2014-09-21: 500 [IU]

## 2014-09-21 MED ORDER — MIDAZOLAM HCL 2 MG/2ML IJ SOLN
INTRAMUSCULAR | Status: AC | PRN
  Administered 2014-09-21: 0.5 mg via INTRAVENOUS
  Administered 2014-09-21: 1 mg via INTRAVENOUS
  Administered 2014-09-21: 0.5 mg via INTRAVENOUS

## 2014-09-21 MED ORDER — SODIUM CHLORIDE 0.9 % IV SOLN
INTRAVENOUS | Status: DC
  Administered 2014-09-21: 13:00:00 via INTRAVENOUS

## 2014-09-21 MED ORDER — CEFAZOLIN SODIUM-DEXTROSE 2-3 GM-% IV SOLR
2.0000 g | INTRAVENOUS | Status: AC
  Administered 2014-09-21: 2 g via INTRAVENOUS

## 2014-09-21 NOTE — H&P (Signed)
Chief Complaint: "I'm here to get a port a cath"  Referring Physician(s): Magrinat,Gustav C  History of Present Illness: Linda [name] is a 69 y.o. female with history of bilateral breast cancers in 2013 and prior bilateral mastectomies, chemoradiation as well as previous right upper chest wall Port-A-Cath, since removed. Recent CT has revealed new left pericardial effusion as well as new mediastinal and hilar lymphadenopathy and malignant left pleural effusion. Patient presents today for Port-A-Cath placement for additional chemotherapy.  Past Medical History  Diagnosis Date  . Cancer 08-13-11    07-31-11-Dx. Bilarteral Breast cancer-left greater than rt.  . Hematuria, undiagnosed cause 08-13-11    Being evaluated by [facility] 08-14-11  . Hypertension   . Bronchitis     hx  . GERD (gastroesophageal reflux disease)     doing well  . 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 Seroma 02/04/12    right breast  200cc removed  erythema on right side  . Seasonal allergies   . History of radiation therapy 02/20/12-04/15/12    left breast,total 60.4 Gy  . Wears partial dentures     wears upper and lower partial  . Allergy   . Blood transfusion without reported diagnosis   . 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   . Cancer of central portion of left female breast 08/01/2011    Past Surgical History  Procedure Laterality Date  . Child birth  08-13-11    x3 -NVD  . Portacath placement  08/15/2011    Procedure: INSERTION PORT-A-CATH;  Surgeon: Harl Bowie, MD;  Location: [facility];  Service: General;  Laterality: N/A;  . Mastectomy w/ sentinel node biopsy  01/21/2012    Procedure: MASTECTOMY WITH SENTINEL LYMPH NODE BIOPSY;  Surgeon: Harl Bowie, MD;  Location: [facility];  Service: General;  Laterality:  Bilateral;  Left modified radical mastectomy, Rt mastectomy with Sentinel lymphnode biospy  . Breast surgery    . Port-a-cath removal Right 12/22/2012    Procedure: REMOVAL PORT-A-CATH;  Surgeon: Harl Bowie, MD;  Location: [facility];  Service: General;  Laterality: Right;    Allergies: Ace inhibitors  Medications: Prior to Admission medications   Medication Sig Start Date End Date Taking? Authorizing Provider  anastrozole (ARIMIDEX) 1 MG tablet Take 1 mg by mouth daily.   Yes Historical Provider, MD  b complex vitamins tablet Take 1 tablet by mouth daily.   Yes Historical Provider, MD  benzonatate (TESSALON) 200 MG capsule Take 1 capsule (200 mg total) by mouth 3 (three) times daily as needed for cough. 09/04/14  Yes Brunetta Jeans, PA-C  losartan-hydrochlorothiazide (HYZAAR) 50-12.5 MG per tablet TAKE 1 TABLET BY MOUTH DAILY. 08/10/14  Yes Jolaine Artist, MD  Multiple Vitamins-Iron (MULTIVITAMINS WITH IRON) TABS Take 1 tablet by mouth daily.   Yes Historical Provider, MD  omeprazole (PRILOSEC) 40 MG capsule Take 1 capsule (40 mg total) by mouth daily. 09/18/14  Yes Chauncey Cruel, MD  chlorpheniramine-HYDROcodone (TUSSIONEX) 10-8 MG/5ML LQCR Take 5 mLs by mouth every 12 (twelve) hours as needed for cough. 09/12/14   Theodis Blaze, MD  dextromethorphan (DELSYM) 30 MG/5ML liquid Take 5 mLs (30 mg total) by mouth 2 (two) times daily. 09/12/14   Theodis Blaze, MD    Family History  Problem  Relation Age of Onset  . Heart disease Mother   . Cancer Mother 84    breast, , 57 deceased  . Heart attack Father   . Cancer Sister 47    breast  . Colon cancer Neg Hx     History   Social History  . Marital Status: Single    Spouse Name: N/A  . Number of Children: 3  . Years of Education: N/A   Occupational History  . Retired    Social History Main Topics  . Smoking status: Never Smoker   . Smokeless tobacco: Never Used  . Alcohol Use: 0.0 oz/week    0  Standard drinks or equivalent per week     Comment: rare- occ.  . Drug Use: No  . Sexual Activity: No     Comment: gxp3, 1st child age 26, menopause mid 98's,no hrt   Other Topics Concern  . None   Social History Narrative   Lives with son.  Single.  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 She moved to [place] about 2 years ago but has a Games developer in [place]. Son Gerald Stabs lives in [place] and works as a Airline pilot. His wife is a Marine scientist. Son Shanon Brow lives at [place] and is a Advertising account executive in addition to having the [employer]. The patient attends a local [employer] here.      Review of Systems  Constitutional: Positive for fatigue. Negative for fever and chills.  Respiratory: Positive for cough. Negative for shortness of breath.   Cardiovascular: Negative for chest pain.  Gastrointestinal: Negative for nausea, vomiting, abdominal pain and blood in stool.  Genitourinary: Negative for dysuria and hematuria.  Musculoskeletal: Negative for back pain.  Neurological: Negative for headaches.  Hematological: Does not bruise/bleed easily.    Vital Signs: BP 132/69 mmHg  Pulse 89  Temp(Src) 98 F (36.7 C) (Oral)  Resp 16  SpO2 95%  Physical Exam  Constitutional: She is oriented to person, place, and time. She appears well-developed and well-nourished.  Cardiovascular: Normal rate and regular rhythm.   Pulmonary/Chest: Effort normal.  Breath sounds slightly diminished on left, right clear  Abdominal: Soft. Bowel sounds are normal. There is no tenderness.  Musculoskeletal: She exhibits edema.  Neurological: She is alert and oriented to person, place, and time.    Imaging: Dg Chest 1 View  09/11/2014   CLINICAL DATA:  Left-sided pleural effusion status post thoracentesis  EXAM: CHEST  1 VIEW  COMPARISON:  09/10/2014  FINDINGS: Cardiac shadow is enlarged. A  left-sided pleural effusion is again noted but smaller in size. No pneumothorax is seen.  IMPRESSION: Slight decrease in left pleural effusion.   Electronically Signed   By: Inez Catalina M.D.   On: 09/11/2014 14:26   Dg Chest 2 View  09/10/2014   CLINICAL DATA:  Two-month history of nonproductive cough  EXAM: CHEST  2 VIEW  COMPARISON:  None.  FINDINGS: There is a sizable left pleural effusion with atelectatic change/consolidation in portions of the left lower lobe and lingula. Lungs elsewhere clear. The heart is slightly enlarged with pulmonary vascularity within normal limits. No adenopathy. There is degenerative change in thoracic spine. There are surgical clips in left axillary region.  IMPRESSION: Left effusion with left lower lobe and inferior lingular atelectasis/consolidation. Right lung clear. Heart slightly enlarged.   Electronically Signed   By: Gwyndolyn Saxon  Jasmine December III M.D.   On: 09/10/2014 13:42   Ct Angio Chest Pe W/cm &/or Wo Cm  09/11/2014   CLINICAL DATA:  Cough for 2 weeks with shortness of breath. Evaluate for pulmonary embolism. History of breast cancer diagnosed in 2013.  EXAM: CT ANGIOGRAPHY CHEST WITH CONTRAST  TECHNIQUE: Multidetector CT imaging of the chest was performed using the standard protocol during bolus administration of intravenous contrast. Multiplanar CT image reconstructions and MIPs were obtained to evaluate the vascular anatomy.  CONTRAST:  161mL OMNIPAQUE IOHEXOL 350 MG/ML SOLN  COMPARISON:  08/13/2011  FINDINGS: THORACIC INLET/BODY WALL:  Bilateral mastectomy. There is distortion in the bilateral axillary, without clear recurrent mass.  MEDIASTINUM:  No cardiomegaly. New pericardial effusion and/or thickening, most notable around the apex. No related chamber distortion. No acute aortic findings. No convincing pulmonary embolism.  There is new mediastinal and hilar lymphadenopathy with unusual asymmetric coarse calcification in left hilar lymph nodes. The nodes are large  enough to mildly narrow the hilar bronchi. Index lower right peritracheal lymph node measures 17 mm.  LUNG WINDOWS:  Small layering left pleural effusion without pleural nodularity. This has been recently sampled. There is no complicating pneumothorax. Extensive atelectasis in the left lower lobe. A band of subpleural opacity in the left upper lobe is most consistent with radiation fibrosis.  UPPER ABDOMEN:  No acute findings.  OSSEOUS:  No acute fracture.  No suspicious lytic or blastic lesions.  Review of the MIP images confirms the above findings.  IMPRESSION: 1. Negative for pulmonary embolism. 2. Mediastinal and bilateral hilar lymphadenopathy, new from staging CT in 2013. Intrathoracic spread of breast cancer or lymphoma is the primary concern, but pattern and dystrophic calcifications raises the possibility of granulomatous disease, including post chemotherapy sarcoid. 3. Small pericardial and left pleural effusion, possibly radiation related. There is moderate radiation fibrosis in the left upper lobe.   Electronically Signed   By: Monte Fantasia M.D.   On: 09/11/2014 16:47   US Thoracentesis Asp Pleural Space W/img Guide  09/11/2014   CLINICAL DATA:  Shortness of breath, left-sided pleural effusion. Request diagnostic and therapeutic left thoracentesis  EXAM: ULTRASOUND GUIDED LEFT THORACENTESIS  COMPARISON:  None.  PROCEDURE: An ultrasound guided thoracentesis was thoroughly discussed with the patient and questions answered. The benefits, risks, alternatives and complications were also discussed. The patient understands and wishes to proceed with the procedure. Written consent was obtained.  Ultrasound was performed to localize and mark an adequate pocket of fluid in the left chest. The area was then prepped and draped in the normal sterile fashion. 1% Lidocaine was used for local anesthesia. Under ultrasound guidance a 19 gauge Yueh catheter was introduced. Thoracentesis was performed. The catheter  was removed and a dressing applied.  COMPLICATIONS: The patient developed a near syncopal episode complaining of weakness and lightheadedness. The procedure was terminated at this point. Patient was placed in a Trendelenburg position. Systolic blood pressure dropped from 133 to 114 and promptly returned to the 130s after just a minute or so. Patient symptoms resolved and she felt much better.  FINDINGS: A total of approximately 900 mL of hazy, yellow pleural fluid was removed. A fluid sample wassent for laboratory analysis.  IMPRESSION: Successful ultrasound guided left thoracentesis yielding 900 mL of pleural fluid.  Read by: Ascencion Dike PA-C   Electronically Signed   By: Markus Daft M.D.   On: 09/11/2014 14:29    Labs:  CBC:  Recent Labs  09/10/14 1513 09/11/14 0435 09/12/14  5916 09/21/14 1244  WBC 10.2 7.5 7.3 7.3  HGB 10.5* 10.4* 10.0* 10.3*  HCT 33.2* 32.3* 31.6* 33.3*  PLT 207 198 216 408*    COAGS:  Recent Labs  09/21/14 1244  INR 1.09  APTT 27    BMP:  Recent Labs  05/15/14 0857 09/10/14 1513 09/11/14 0435 09/12/14 0552  NA 142 134* 138 137  K 3.5 3.6 3.5 3.3*  CL  --  98 101 101  CO2 28 26 29 27   GLUCOSE 117 99 109* 111*  BUN 10.0 25* 24* 18  CALCIUM 9.6 8.4 8.6 8.5  CREATININE 0.7 1.02 0.86 0.78  GFRNONAA  --  55* 68* 84*  GFRAA  --  64* 79* >90    LIVER FUNCTION TESTS:  Recent Labs  09/26/13 0814 05/15/14 0857 09/10/14 1513  BILITOT 0.31 0.46 0.8  AST 14 12 39*  ALT 9 8 26   ALKPHOS 100 89 99  PROT 6.5 6.8 7.1  ALBUMIN 3.4* 3.3* 2.9*    TUMOR MARKERS: No results for input(s): AFPTM, CEA, CA199, CHROMGRNA in the last 8760 hours.  Assessment and Plan: Linda [name] is a 69 y.o. female with history of bilateral breast cancers in 2013 and prior bilateral mastectomies, chemoradiation as well as previous right upper chest wall Port-A-Cath, since removed. Recent CT has revealed new left pericardial effusion as well as new mediastinal and  hilar lymphadenopathy and malignant left pleural effusion. Patient presents today for Port-A-Cath placement for additional chemotherapy.Risks and benefits discussed with the patient including, but not limited to bleeding, infection, pneumothorax, or fibrin sheath development and need for additional procedures. All of the patient's questions were answered, patient is agreeable to proceed. Consent signed and in chart.       Signed: Autumn Messing 09/21/2014, 1:47 PM   I spent a total of 20 minutes face to face in clinical consultation, greater than 50% of which was counseling/coordinating care for Port-A-Cath placement

## 2014-09-21 NOTE — Discharge Instructions (Signed)
Implanted Port Insertion, Care After Refer to this sheet in the next few weeks. These instructions provide you with information on caring for yourself after your procedure. Your health care provider may also give you more specific instructions. Your treatment has been planned according to current medical practices, but problems sometimes occur. Call your health care provider if you have any problems or questions after your procedure. WHAT TO EXPECT AFTER THE PROCEDURE After your procedure, it is typical to have the following:   Discomfort at the port insertion site. Ice packs to the area will help.  Bruising on the skin over the port. This will subside in 3-4 days. HOME CARE INSTRUCTIONS  After your port is placed, you will get a manufacturer's information card. The card has information about your port. Keep this card with you at all times.   Know what kind of port you have. There are many types of ports available.   Wear a medical alert bracelet in case of an emergency. This can help alert health care workers that you have a port.   The port can stay in for as long as your health care provider believes it is necessary.   A home health care nurse may give medicines and take care of the port.   You or a family member can get special training and directions for giving medicine and taking care of the port at home.  SEEK MEDICAL CARE IF:   Your port does not flush or you are unable to get a blood return.   You have a fever or chills. SEEK IMMEDIATE MEDICAL CARE IF:  You have new fluid or pus coming from your incision.   You notice a bad smell coming from your incision site.   You have swelling, pain, or more redness at the incision or port site.   You have chest pain or shortness of breath. Document Released: 03/23/2013 Document Revised: 06/07/2013 Document Reviewed: 03/23/2013 [facility] Patient Information 2015 [place], [place]. This information is not intended to replace  advice given to you by your health care provider. Make sure you discuss any questions you have with your health care provider.  May remove dressing and shower 24-48 hours post procedure.  Keep wound clean and dry. Report any signs of infection.  Implanted [facility] Guide An implanted port is a type of central line that is placed under the skin. Central lines are used to provide IV access when treatment or nutrition needs to be given through a person's veins. Implanted ports are used for long-term IV access. An implanted port may be placed because:   You need IV medicine that would be irritating to the small veins in your hands or arms.   You need long-term IV medicines, such as antibiotics.   You need IV nutrition for a long period.   You need frequent blood draws for lab tests.   You need dialysis.  Implanted ports are usually placed in the chest area, but they can also be placed in the upper arm, the abdomen, or the leg. An implanted port has two main parts:   Reservoir. The reservoir is round and will appear as a small, raised area under your skin. The reservoir is the part where a needle is inserted to give medicines or draw blood.   Catheter. The catheter is a thin, flexible tube that extends from the reservoir. The catheter is placed into a large vein. Medicine that is inserted into the reservoir goes into the catheter and  then into the vein.  HOW WILL I CARE FOR MY INCISION SITE? Do not get the incision site wet. Bathe or shower as directed by your health care provider.  HOW IS MY PORT ACCESSED? Special steps must be taken to access the port:   Before the port is accessed, a numbing cream can be placed on the skin. This helps numb the skin over the port site.   Your health care provider uses a sterile technique to access the port.  Your health care provider must put on a mask and sterile gloves.  The skin over your port is cleaned carefully with an antiseptic and  allowed to dry.  The port is gently pinched between sterile gloves, and a needle is inserted into the port.  Only "non-coring" port needles should be used to access the port. Once the port is accessed, a blood return should be checked. This helps ensure that the port is in the vein and is not clogged.   If your port needs to remain accessed for a constant infusion, a clear (transparent) bandage will be placed over the needle site. The bandage and needle will need to be changed every week, or as directed by your health care provider.   Keep the bandage covering the needle clean and dry. Do not get it wet. Follow your health care provider's instructions on how to take a shower or bath while the port is accessed.   If your port does not need to stay accessed, no bandage is needed over the port.  WHAT IS FLUSHING? Flushing helps keep the port from getting clogged. Follow your health care provider's instructions on how and when to flush the port. Ports are usually flushed with saline solution or a medicine called heparin. The need for flushing will depend on how the port is used.   If the port is used for intermittent medicines or blood draws, the port will need to be flushed:   After medicines have been given.   After blood has been drawn.   As part of routine maintenance.   If a constant infusion is running, the port may not need to be flushed.  HOW LONG WILL MY PORT STAY IMPLANTED? The port can stay in for as long as your health care provider thinks it is needed. When it is time for the port to come out, surgery will be done to remove it. The procedure is similar to the one performed when the port was put in.  WHEN SHOULD I SEEK IMMEDIATE MEDICAL CARE? When you have an implanted port, you should seek immediate medical care if:   You notice a bad smell coming from the incision site.   You have swelling, redness, or drainage at the incision site.   You have more swelling or  pain at the port site or the surrounding area.   You have a fever that is not controlled with medicine. Document Released: 06/02/2005 Document Revised: 03/23/2013 Document Reviewed: 02/07/2013 [facility] Patient Information 2015 [place], [place]. This information is not intended to replace advice given to you by your health care provider. Make sure you discuss any questions you have with your health care provider. Conscious Sedation Sedation is the use of medicines to promote relaxation and relieve discomfort and anxiety. Conscious sedation is a type of sedation. Under conscious sedation you are less alert than normal but are still able to respond to instructions or stimulation. Conscious sedation is used during short medical and dental procedures. It is milder  than deep sedation or general anesthesia and allows you to return to your regular activities sooner.  LET [facility] CARE PROVIDER KNOW ABOUT:   Any allergies you have.  All medicines you are taking, including vitamins, herbs, eye drops, creams, and over-the-counter medicines.  Use of steroids (by mouth or creams).  Previous problems you or members of your family have had with the use of anesthetics.  Any blood disorders you have.  Previous surgeries you have had.  Medical conditions you have.  Possibility of pregnancy, if this applies.  Use of cigarettes, alcohol, or illegal drugs. RISKS AND COMPLICATIONS Generally, this is a safe procedure. However, as with any procedure, problems can occur. Possible problems include:  Oversedation.  Trouble breathing on your own. You may need to have a breathing tube until you are awake and breathing on your own.  Allergic reaction to any of the medicines used for the procedure. BEFORE THE PROCEDURE  You may have blood tests done. These tests can help show how well your kidneys and liver are working. They can also show how well your blood clots.  A physical exam will be done.  Only  take medicines as directed by your health care provider. You may need to stop taking medicines (such as blood thinners, aspirin, or nonsteroidal anti-inflammatory drugs) before the procedure.   Do not eat or drink at least 6 hours before the procedure or as directed by your health care provider.  Arrange for a responsible adult, family member, or friend to take you home after the procedure. He or she should stay with you for at least 24 hours after the procedure, until the medicine has worn off. PROCEDURE   An intravenous (IV) catheter will be inserted into one of your veins. Medicine will be able to flow directly into your body through this catheter. You may be given medicine through this tube to help prevent pain and help you relax.  The medical or dental procedure will be done. AFTER THE PROCEDURE  You will stay in a recovery area until the medicine has worn off. Your blood pressure and pulse will be checked.   Depending on the procedure you had, you may be allowed to go home when you can tolerate liquids and your pain is under control. Document Released: 02/25/2001 Document Revised: 06/07/2013 Document Reviewed: 02/07/2013 [facility] Patient Information 2015 [place], [place]. This information is not intended to replace advice given to you by your health care provider. Make sure you discuss any questions you have with your health care provider.

## 2014-09-21 NOTE — Procedures (Signed)
Interventional Radiology Procedure Note  Procedure: Placement of a right IJ approach single lumen PowerPort.  Tip is positioned at the superior cavoatrial junction and catheter is ready for immediate use.  Complications: No immediate Recommendations:  - Ok to shower tomorrow - Routine line care   Somnang Mahan T. Kerri Asche, M.D Pager:  [phone]   

## 2014-09-22 ENCOUNTER — Telehealth: Payer: Self-pay

## 2014-09-22 ENCOUNTER — Ambulatory Visit (HOSPITAL_COMMUNITY): Payer: Medicare Other

## 2014-09-22 ENCOUNTER — Ambulatory Visit (HOSPITAL_COMMUNITY): Payer: Self-pay

## 2014-09-22 NOTE — Telephone Encounter (Signed)
Linda [name] called stating that bone scan is planned for Mon 4/11 at 0930 at [facility]. There is no opening for MRI until 4/20. Linda [name] stated if dr Jana Hakim orders MRI stat she may be able to get it done on Monday as well.

## 2014-09-25 ENCOUNTER — Other Ambulatory Visit: Payer: Self-pay | Admitting: Oncology

## 2014-09-25 ENCOUNTER — Other Ambulatory Visit: Payer: Self-pay | Admitting: *Deleted

## 2014-09-25 ENCOUNTER — Ambulatory Visit (HOSPITAL_COMMUNITY)
Admission: RE | Admit: 2014-09-25 | Discharge: 2014-09-25 | Disposition: A | Discharge status: Home / Self Care | Payer: Medicare Other | Source: Ambulatory Visit | Attending: Oncology | Admitting: Oncology

## 2014-09-25 DIAGNOSIS — C50919 Malignant neoplasm of unspecified site of unspecified female breast: Secondary | ICD-10-CM | POA: Insufficient documentation

## 2014-09-25 DIAGNOSIS — C7931 Secondary malignant neoplasm of brain: Secondary | ICD-10-CM | POA: Diagnosis not present

## 2014-09-25 DIAGNOSIS — C7951 Secondary malignant neoplasm of bone: Secondary | ICD-10-CM | POA: Insufficient documentation

## 2014-09-25 DIAGNOSIS — J910 Malignant pleural effusion: Secondary | ICD-10-CM

## 2014-09-25 DIAGNOSIS — Z9013 Acquired absence of bilateral breasts and nipples: Secondary | ICD-10-CM | POA: Insufficient documentation

## 2014-09-25 MED ORDER — TECHNETIUM TC 99M MEDRONATE IV KIT
25.0000 | PACK | Freq: Once | INTRAVENOUS | Status: AC | PRN
Start: 2014-09-25 — End: 2014-09-25
  Administered 2014-09-25: 25 via INTRAVENOUS

## 2014-09-25 MED ORDER — GADOBENATE DIMEGLUMINE 529 MG/ML IV SOLN
15.0000 mL | Freq: Once | INTRAVENOUS | Status: AC | PRN
  Administered 2014-09-25: 15 mL via INTRAVENOUS

## 2014-09-26 ENCOUNTER — Ambulatory Visit (HOSPITAL_BASED_OUTPATIENT_CLINIC_OR_DEPARTMENT_OTHER): Payer: Medicare Other

## 2014-09-26 DIAGNOSIS — J910 Malignant pleural effusion: Secondary | ICD-10-CM | POA: Diagnosis not present

## 2014-09-26 DIAGNOSIS — C50111 Malignant neoplasm of central portion of right female breast: Secondary | ICD-10-CM | POA: Diagnosis not present

## 2014-09-26 DIAGNOSIS — Z5112 Encounter for antineoplastic immunotherapy: Secondary | ICD-10-CM

## 2014-09-26 DIAGNOSIS — C50912 Malignant neoplasm of unspecified site of left female breast: Secondary | ICD-10-CM

## 2014-09-26 DIAGNOSIS — C50911 Malignant neoplasm of unspecified site of right female breast: Secondary | ICD-10-CM | POA: Diagnosis not present

## 2014-09-26 MED ORDER — HEPARIN SOD (PORK) LOCK FLUSH 100 UNIT/ML IV SOLN
500.0000 [IU] | Freq: Once | INTRAVENOUS | Status: AC | PRN
  Administered 2014-09-26: 500 [IU]
  Filled 2014-09-26: qty 5

## 2014-09-26 MED ORDER — SODIUM CHLORIDE 0.9 % IJ SOLN
10.0000 mL | INTRAMUSCULAR | Status: DC | PRN
  Administered 2014-09-26: 10 mL
  Filled 2014-09-26: qty 10

## 2014-09-26 MED ORDER — ACETAMINOPHEN 325 MG PO TABS
650.0000 mg | ORAL_TABLET | Freq: Once | ORAL | Status: AC
  Administered 2014-09-26: 650 mg via ORAL

## 2014-09-26 MED ORDER — TRASTUZUMAB CHEMO INJECTION 440 MG
8.0000 mg/kg | Freq: Once | INTRAVENOUS | Status: AC
  Administered 2014-09-26: 861 mg via INTRAVENOUS
  Filled 2014-09-26: qty 41

## 2014-09-26 MED ORDER — DIPHENHYDRAMINE HCL 25 MG PO CAPS
ORAL_CAPSULE | ORAL | Status: AC
  Filled 2014-09-26: qty 1

## 2014-09-26 MED ORDER — ACETAMINOPHEN 325 MG PO TABS
ORAL_TABLET | ORAL | Status: AC
  Filled 2014-09-26: qty 2

## 2014-09-26 MED ORDER — DIPHENHYDRAMINE HCL 25 MG PO CAPS
25.0000 mg | ORAL_CAPSULE | Freq: Once | ORAL | Status: AC
  Administered 2014-09-26: 25 mg via ORAL

## 2014-09-26 MED ORDER — SODIUM CHLORIDE 0.9 % IV SOLN
840.0000 mg | Freq: Once | INTRAVENOUS | Status: AC
  Administered 2014-09-26: 840 mg via INTRAVENOUS
  Filled 2014-09-26: qty 28

## 2014-09-26 MED ORDER — SODIUM CHLORIDE 0.9 % IV SOLN
Freq: Once | INTRAVENOUS | Status: AC
  Administered 2014-09-26: 08:00:00 via INTRAVENOUS

## 2014-09-26 NOTE — Patient Instructions (Signed)
[facility] Discharge Instructions for Patients Receiving Chemotherapy  Today you received the following chemotherapy agents Herceptin and Perjeta.   To help prevent nausea and vomiting after your treatment, we encourage you to take your nausea medication.   If you develop nausea and vomiting that is not controlled by your nausea medication, call the clinic.   BELOW ARE SYMPTOMS THAT SHOULD BE REPORTED IMMEDIATELY:  *FEVER GREATER THAN 100.5 F  *CHILLS WITH OR WITHOUT FEVER  NAUSEA AND VOMITING THAT IS NOT CONTROLLED WITH YOUR NAUSEA MEDICATION  *UNUSUAL SHORTNESS OF BREATH  *UNUSUAL BRUISING OR BLEEDING  TENDERNESS IN MOUTH AND THROAT WITH OR WITHOUT PRESENCE OF ULCERS  *URINARY PROBLEMS  *BOWEL PROBLEMS  UNUSUAL RASH Items with * indicate a potential emergency and should be followed up as soon as possible.  Feel free to call the clinic you have any questions or concerns. The clinic phone number is [phone].  Please show the CHEMO ALERT CARD at check-in to the Emergency Department and triage nurse.   

## 2014-09-27 ENCOUNTER — Encounter: Payer: Self-pay | Admitting: *Deleted

## 2014-09-27 ENCOUNTER — Other Ambulatory Visit: Payer: Self-pay | Admitting: Oncology

## 2014-09-28 ENCOUNTER — Other Ambulatory Visit: Payer: Self-pay | Admitting: Oncology

## 2014-09-28 NOTE — Progress Notes (Unsigned)
I called Linda [name] and gave her the results of the brain and bone scans. I let her know she has multiple lesions in the brain and bones. I told her she would be called by the radiation oncologist probably within the next 24 hours to set up an appointment and start radiation.  She is scheduled for her next pertuzumab/trastuzumab treatment the first week in May. We will add zolendronate at that time.

## 2014-09-29 NOTE — Progress Notes (Addendum)
Location/Histology of Brain Tumor: RECOn B/L breast cancer  (stage IV now)now with Brain mets and Bone lesions( whole brain for multiple brain mets)  Patient presented with symptoms of:  Significant coughing, shortness breath worsening,  Past or anticipated interventions, if any, per neurosurgery:   Past or anticipated interventions, if any, per medical oncology Dr. April Holding on  09/18/14:: 2) Status post 4 dose dense cycles of doxorubicin/ cyclophosphamide followed by 4 dose dense cycles of paclitaxel and trastuzumab completed 12/09/2011  (3) the trastuzumab was continued for a total of one year (to 11/01/2012). Final echo on 11/04/2012 showing a well preserved ejection fraction of 55-60%.6) Started anastrazole 04/16/2012; normal dexa scan 05/19/2014 at the [facility] 09/11/2014 (7) CT angiogram 09/11/2014 shows new left pericardial effusion and new mediastinal and hilar lymphadenopathy; there were no suspicious upper abdominal findings and no bony findings. Cytology from the left effusion 09/11/2014 ([facility] 16-203) shows malignant cells which are HER-2 positive  (8) to start trastuzumab/pertuzumab 09/26/2014, to be repeated every 21 days indefinitely (a) echocardiogram 09/11/2014 shows a well preserved ejection fraction.  Dose of Decadron, if applicable:No  Recent neurologic symptoms, if any:   Seizures: NO  Headaches: No  Nausea: NO  Dizziness/ataxia:No   Difficulty with hand coordination: No  Focal numbness/weakness: No  Visual deficits/changes: No  Confusion/Memory deficits: No  Painful bone metastases at present, if any: No  SAFETY ISSUES:  Prior radiation? Yes   Left Breast 02/20/2012-10/31/213   Pacemaker/ICD? No  Possible current pregnancy? NO  Is the patient on methotrexate?NO Additional Complaints / other details: MRI 09/25/14= IMPRESSION: 1. Positive for numerous small enhancing brain metastases, up to 55m diameter. Mild  associated edema and no significant intracranialmass effect at this time. 2. Positive for calvarium/bone metastases. No dural involvement Identified. Patient states she was asymptomatic except for cough.She had fluid build up on lung which was drawn off and tested to be positive for cancer cells which led to ct of chest and mri of brain. BP 149/93 mmHg  Pulse 97  Temp(Src) 98.1 F (36.7 C)  Wt 228 lb 14.4 oz (103.828 kg)

## 2014-10-02 ENCOUNTER — Encounter: Payer: Self-pay | Admitting: Radiation Oncology

## 2014-10-02 ENCOUNTER — Ambulatory Visit
Admission: RE | Admit: 2014-10-02 | Discharge: 2014-10-02 | Disposition: A | Discharge status: Home / Self Care | Payer: Medicare Other | Source: Ambulatory Visit | Attending: Radiation Oncology | Admitting: Radiation Oncology

## 2014-10-02 ENCOUNTER — Telehealth: Payer: Self-pay | Admitting: *Deleted

## 2014-10-02 VITALS — BP 149/93 | HR 97 | Temp 98.1°F | Wt 228.9 lb

## 2014-10-02 DIAGNOSIS — Z9221 Personal history of antineoplastic chemotherapy: Secondary | ICD-10-CM | POA: Diagnosis not present

## 2014-10-02 DIAGNOSIS — C801 Malignant (primary) neoplasm, unspecified: Secondary | ICD-10-CM

## 2014-10-02 DIAGNOSIS — C50112 Malignant neoplasm of central portion of left female breast: Secondary | ICD-10-CM | POA: Diagnosis not present

## 2014-10-02 DIAGNOSIS — Z510 Encounter for antineoplastic radiation therapy: Secondary | ICD-10-CM | POA: Diagnosis not present

## 2014-10-02 DIAGNOSIS — L599 Disorder of the skin and subcutaneous tissue related to radiation, unspecified: Secondary | ICD-10-CM | POA: Insufficient documentation

## 2014-10-02 DIAGNOSIS — C7931 Secondary malignant neoplasm of brain: Secondary | ICD-10-CM

## 2014-10-02 DIAGNOSIS — Z923 Personal history of irradiation: Secondary | ICD-10-CM | POA: Insufficient documentation

## 2014-10-02 DIAGNOSIS — Z170 Estrogen receptor positive status [ER+]: Secondary | ICD-10-CM | POA: Insufficient documentation

## 2014-10-02 NOTE — Progress Notes (Signed)
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  Narrative:  The patient returns today for routine follow-up.  Location/Histology of Brain Tumor: RECOn B/L breast cancer  (stage IV now)now with Brain mets and Bone lesions( whole brain for multiple brain mets). Continuing chemotherapy, most recent since last Tuesday April 12th, next treatment on May 3rd. Patient is also on antibiotics.Patient denies headaches,weakness, nausea. Mask to be created for upcoming radiation treatment.   Patient presented with symptoms of:  Significant coughing, shortness breath worsening,  Past or anticipated interventions, if any, per neurosurgery:   Past or anticipated interventions, if any, per medical oncology Dr. April Holding on  09/18/14:: 2) Status post 4 dose dense cycles of doxorubicin/ cyclophosphamide followed by 4 dose dense cycles of paclitaxel and trastuzumab completed 12/09/2011  (3) the trastuzumab was continued for a total of one year (to 11/01/2012). Final echo on 11/04/2012 showing a well preserved ejection fraction of 55-60%.6) Started anastrazole 04/16/2012; normal dexa scan 05/19/2014 at the [facility] 09/11/2014 (7) CT angiogram 09/11/2014 shows new left pericardial effusion and new mediastinal and hilar lymphadenopathy; there were no suspicious upper abdominal findings and no bony findings. Cytology from the left effusion 09/11/2014 ([facility] 16-203) shows malignant cells which are HER-2 positive  (8) to start trastuzumab/pertuzumab 09/26/2014, to be repeated every 21 days indefinitely (a) echocardiogram 09/11/2014 shows a well preserved ejection fraction.  Dose of  Decadron, if applicable:No  Recent neurologic symptoms, if any:   Seizures: NO  Headaches: No  Nausea: NO  Dizziness/ataxia:No   Difficulty with hand coordination: No  Focal numbness/weakness: No  Visual deficits/changes: No  Confusion/Memory deficits: No  Painful bone metastases at present, if any: No                               ALLERGIES:  is allergic to ace inhibitors.  Meds: Current Outpatient Prescriptions  Medication Sig Dispense Refill  . b complex vitamins tablet Take 1 tablet by mouth daily.    [name] losartan-hydrochlorothiazide (HYZAAR) 50-12.5 MG per tablet TAKE 1 TABLET BY MOUTH DAILY. 30 tablet 3  . Multiple Vitamins-Iron (MULTIVITAMINS WITH IRON) TABS Take 1 tablet by mouth daily.    [name] omeprazole (PRILOSEC) 40 MG capsule Take 1 capsule (40 mg total) by mouth daily. 90 capsule 4  . anastrozole (ARIMIDEX) 1 MG tablet Take 1 mg by mouth daily.     No current facility-administered medications for this encounter.    Physical Findings: The patient is in no acute distress. Patient is alert and oriented.  weight is 228 lb 14.4 oz (103.828 kg). Her temperature is 98.1 F (36.7 C). Her blood pressure is 149/93 and her pulse is 97. .     Lab Findings: Lab Results  Component Value Date   WBC 7.3 09/21/2014   HGB 10.3* 09/21/2014   HCT 33.3* 09/21/2014   MCV 93.5 09/21/2014   PLT 408* 09/21/2014     Radiographic Findings: Dg Chest 1 View  09/11/2014   CLINICAL DATA:  Left-sided pleural effusion status post thoracentesis  EXAM: CHEST  1 VIEW  COMPARISON:  09/10/2014  FINDINGS: Cardiac  shadow is enlarged. A left-sided pleural effusion is again noted but smaller in size. No pneumothorax is seen.  IMPRESSION: Slight decrease in left pleural effusion.   Electronically Signed   By: Inez Catalina M.D.   On: 09/11/2014 14:26   Dg Chest 2 View  09/10/2014   CLINICAL DATA:  Two-month history of nonproductive cough  EXAM: CHEST  2 VIEW  COMPARISON:  None.  FINDINGS:  There is a sizable left pleural effusion with atelectatic change/consolidation in portions of the left lower lobe and lingula. Lungs elsewhere clear. The heart is slightly enlarged with pulmonary vascularity within normal limits. No adenopathy. There is degenerative change in thoracic spine. There are surgical clips in left axillary region.  IMPRESSION: Left effusion with left lower lobe and inferior lingular atelectasis/consolidation. Right lung clear. Heart slightly enlarged.   Electronically Signed   By: Lowella Grip III M.D.   On: 09/10/2014 13:42   Ct Angio Chest Pe W/cm &/or Wo Cm  09/11/2014   CLINICAL DATA:  Cough for 2 weeks with shortness of breath. Evaluate for pulmonary embolism. History of breast cancer diagnosed in 2013.  EXAM: CT ANGIOGRAPHY CHEST WITH CONTRAST  TECHNIQUE: Multidetector CT imaging of the chest was performed using the standard protocol during bolus administration of intravenous contrast. Multiplanar CT image reconstructions and MIPs were obtained to evaluate the vascular anatomy.  CONTRAST:  153m OMNIPAQUE IOHEXOL 350 MG/ML SOLN  COMPARISON:  08/13/2011  FINDINGS: THORACIC INLET/BODY WALL:  Bilateral mastectomy. There is distortion in the bilateral axillary, without clear recurrent mass.  MEDIASTINUM:  No cardiomegaly. New pericardial effusion and/or thickening, most notable around the apex. No related chamber distortion. No acute aortic findings. No convincing pulmonary embolism.  There is new mediastinal and hilar lymphadenopathy with unusual asymmetric coarse calcification in left hilar lymph nodes. The nodes are large enough to mildly narrow the hilar bronchi. Index lower right peritracheal lymph node measures 17 mm.  LUNG WINDOWS:  Small layering left pleural effusion without pleural nodularity. This has been recently sampled. There is no complicating pneumothorax. Extensive atelectasis in the left lower lobe. A band of subpleural opacity in the left upper lobe is most  consistent with radiation fibrosis.  UPPER ABDOMEN:  No acute findings.  OSSEOUS:  No acute fracture.  No suspicious lytic or blastic lesions.  Review of the MIP images confirms the above findings.  IMPRESSION: 1. Negative for pulmonary embolism. 2. Mediastinal and bilateral hilar lymphadenopathy, new from staging CT in 2013. Intrathoracic spread of breast cancer or lymphoma is the primary concern, but pattern and dystrophic calcifications raises the possibility of granulomatous disease, including post chemotherapy sarcoid. 3. Small pericardial and left pleural effusion, possibly radiation related. There is moderate radiation fibrosis in the left upper lobe.   Electronically Signed   By: JMonte FantasiaM.D.   On: 09/11/2014 16:47   Mr BJeri CosWLGContrast  09/25/2014   CLINICAL DATA:  S80-year-old female with breast cancer, staging. Subsequent encounter.  EXAM: MRI HEAD WITHOUT AND WITH CONTRAST  TECHNIQUE: Multiplanar, multiecho pulse sequences of the brain and surrounding structures were obtained without and with intravenous contrast.  CONTRAST:  154mMULTIHANCE GADOBENATE DIMEGLUMINE 529 MG/ML IV SOLN  COMPARISON:  None.  FINDINGS: Numerous small enhancing metastases scattered throughout the brain (approximately 20 metastases in the cerebellum alone. These range in size from punctate to 11 mm diameter.  Milder trace vasogenic edema associated with some of the larger lesions. Edema is maximal in the right cingulate gyrus  and left cerebellum, but there is no significant intracranial mass effect at this time. No definite hemorrhagic metastases.  Superimposed osseous metastatic disease, including among the most conspicuous a 10 mm left frontal bone metastasis on series 6, image 17. No dural thickening or hyper enhancement. No leptomeningeal enhancement identified.  No ventriculomegaly. No restricted diffusion or evidence of acute infarction. Major intracranial vascular flow voids are preserved. Negative  pituitary and cervicomedullary junction. Grossly negative visualized cervical spinal cord.  Visible internal auditory structures appear normal. Mastoids are clear. Paranasal sinuses are clear. Orbits soft tissues are within normal limits. Visualized scalp soft tissues are within normal limits.  IMPRESSION: 1. Positive for numerous small enhancing brain metastases, up to 11 mm diameter. Mild associated edema and no significant intracranial mass effect at this time. 2. Positive for calvarium/bone metastases. No dural involvement identified.   Electronically Signed   By: Genevie Ann M.D.   On: 09/25/2014 15:19   Nm Bone Scan Whole Body  09/25/2014   CLINICAL DATA:  Bilateral mastectomy 2013 for breast cancer. Recent diagnosis of metastatic disease to the chest.  EXAM: NUCLEAR MEDICINE WHOLE BODY BONE SCAN  TECHNIQUE: Whole body anterior and posterior images were obtained approximately 3 hours after intravenous injection of radiopharmaceutical.  RADIOPHARMACEUTICALS:  25.0 mCi Technetium-99 MDP  COMPARISON:  None.  FINDINGS: The study shows multiple foci of increased activity related to the skeleton consistent with widespread osseous metastatic disease. There are numerous foci of increased activity scattered throughout the calvarium. There are numerous foci of increased activity scattered throughout the thoracic spine. Low level activity in the lower lumbar spine is nonspecific and could be degenerative or related to less bulky disease in that region. There are multiple focal areas of activity in the ribs consistent with rib metastases. There is an metastasis in the right humeral diaphysis. There are multiple pelvic metastases, most notable at the right acetabulum and right superior pubic ramus. There is low level activity in the right proximal femur/ femoral neck. There is patchy disease within the diaphyseal region of both femurs. Activity in the right medial femoral condyle could be metastatic or degenerative. Foci of  metastases are seen within the tibial diaphysis regions.  IMPRESSION: Metastatic foci scattered throughout the axial and appendicular skeleton as described above. Areas of most potential concern include disease in the thoracic spine, disease in the right acetabulum, right superior pubic ramus and possibly proximal right femur, disease in the right humeral shaft and in both femoral shafts.   Electronically Signed   By: Nelson Chimes M.D.   On: 09/25/2014 14:36   Ir Fluoro Guide Cv Line Right  09/21/2014   CLINICAL DATA:  Breast carcinoma with recurrence in left chest with malignant pleural effusion. The patient requires a porta cath for further chemotherapy needs.  EXAM: IMPLANTED PORT A CATH PLACEMENT WITH ULTRASOUND AND FLUOROSCOPIC GUIDANCE  ANESTHESIA/SEDATION: 2.0 Mg IV Versed; 50 mcg IV Fentanyl  Total Moderate Sedation Time:  46 minutes  Additional Medications: 2 g IV Ancef. As antibiotic prophylaxis, Ancef was ordered pre-procedure and administered intravenously within one hour of incision.  FLUOROSCOPY TIME:  2 minutes and 18 seconds.  PROCEDURE: The procedure, risks, benefits, and alternatives were explained to the patient. Questions regarding the procedure were encouraged and answered. The patient understands and consents to the procedure. A time-out was performed prior to the procedure.  The right neck and chest were prepped with chlorhexidine in a sterile fashion, and a sterile drape was applied covering the operative  field. Maximum barrier sterile technique with sterile gowns and gloves were used for the procedure. Local anesthesia was provided with 1% lidocaine.  Ultrasound was used to confirm patency of the right internal jugular vein. After creating a small venotomy incision, a 21 gauge needle was advanced into the right internal jugular vein under direct, real-time ultrasound guidance. Ultrasound image documentation was performed. After securing guidewire access, an 8 Fr dilator was placed. A  J-wire was kinked to measure appropriate catheter length.  A subcutaneous port pocket was then created along the upper chest wall utilizing sharp and blunt dissection. Portable cautery was utilized. The pocket was irrigated with sterile saline.  A single lumen power injectable port was chosen for placement. The 8 Fr catheter was tunneled from the port pocket site to the venotomy incision. The port was placed in the pocket. External catheter was trimmed to appropriate length based on guidewire measurement.  At the venotomy, an 8 Fr peel-away sheath was placed over a guidewire. The catheter was then placed through the sheath and the sheath removed. Final catheter positioning was confirmed and documented with a fluoroscopic spot image. The port was accessed with a needle and aspirated and flushed with heparinized saline. The needle was removed.  The venotomy and port pocket incisions were closed with subcutaneous 3-0 Monocryl and subcuticular 4-0 Vicryl. Dermabond was applied to both incisions.  COMPLICATIONS: None  FINDINGS: After catheter placement, the tip lies at the cavoatrial junction. The catheter aspirates normally and is ready for immediate use.  IMPRESSION: Placement of single lumen port a cath via right internal jugular vein. The catheter tip lies at the cavoatrial junction. A power injectable port a cath was placed and is ready for immediate use.   Electronically Signed   By: Aletta Edouard M.D.   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 The patient requires a porta cath for further chemotherapy needs.  EXAM: IMPLANTED PORT A CATH PLACEMENT WITH ULTRASOUND AND FLUOROSCOPIC GUIDANCE  ANESTHESIA/SEDATION: 2.0 Mg IV Versed; 50 mcg IV Fentanyl  Total Moderate Sedation Time:  46 minutes  Additional Medications: 2 g IV Ancef. As antibiotic prophylaxis, Ancef was ordered pre-procedure and  administered intravenously within one hour of incision.  FLUOROSCOPY TIME:  2 minutes and 18 seconds.  PROCEDURE: The procedure, risks, benefits, and alternatives were explained to the patient. Questions regarding the procedure were encouraged and answered. The patient understands and consents to the procedure. A time-out was performed prior to the procedure.  The right neck and chest were prepped with chlorhexidine in a sterile fashion, and a sterile drape was applied covering the operative field. Maximum barrier sterile technique with sterile gowns and gloves were used for the procedure. Local anesthesia was provided with 1% lidocaine.  Ultrasound was used to confirm patency of the right internal jugular vein. After creating a small venotomy incision, a 21 gauge needle was advanced into the right internal jugular vein under direct, real-time ultrasound guidance. Ultrasound image documentation was performed. After securing guidewire access, an 8 Fr dilator was placed. A J-wire was kinked to measure appropriate catheter length.  A subcutaneous port pocket was then created along the upper chest wall utilizing sharp and blunt dissection. Portable cautery was utilized. The pocket was irrigated with sterile saline.  A single lumen power injectable port was chosen for placement. The 8 Fr catheter was tunneled from the  port pocket site to the venotomy incision. The port was placed in the pocket. External catheter was trimmed to appropriate length based on guidewire measurement.  At the venotomy, an 8 Fr peel-away sheath was placed over a guidewire. The catheter was then placed through the sheath and the sheath removed. Final catheter positioning was confirmed and documented with a fluoroscopic spot image. The port was accessed with a needle and aspirated and flushed with heparinized saline. The needle was removed.  The venotomy and port pocket incisions were closed with subcutaneous 3-0 Monocryl and subcuticular 4-0  Vicryl. Dermabond was applied to both incisions.  COMPLICATIONS: None  FINDINGS: After catheter placement, the tip lies at the cavoatrial junction. The catheter aspirates normally and is ready for immediate use.  IMPRESSION: Placement of single lumen port a cath via right internal jugular vein. The catheter tip lies at the cavoatrial junction. A power injectable port a cath was placed and is ready for immediate use.   Electronically Signed   By: Aletta Edouard M.D.   On: 09/21/2014 17:41   US Thoracentesis Asp Pleural Space W/img Guide  09/11/2014   CLINICAL DATA:  Shortness of breath, left-sided pleural effusion. Request diagnostic and therapeutic left thoracentesis  EXAM: ULTRASOUND GUIDED LEFT THORACENTESIS  COMPARISON:  None.  PROCEDURE: An ultrasound guided thoracentesis was thoroughly discussed with the patient and questions answered. The benefits, risks, alternatives and complications were also discussed. The patient understands and wishes to proceed with the procedure. Written consent was obtained.  Ultrasound was performed to localize and mark an adequate pocket of fluid in the left chest. The area was then prepped and draped in the normal sterile fashion. 1% Lidocaine was used for local anesthesia. Under ultrasound guidance a 19 gauge Yueh catheter was introduced. Thoracentesis was performed. The catheter was removed and a dressing applied.  COMPLICATIONS: The patient developed a near syncopal episode complaining of weakness and lightheadedness. The procedure was terminated at this point. Patient was placed in a Trendelenburg position. Systolic blood pressure dropped from 133 to 114 and promptly returned to the 130s after just a minute or so. Patient symptoms resolved and she felt much better.  FINDINGS: A total of approximately 900 mL of hazy, yellow pleural fluid was removed. A fluid sample wassent for laboratory analysis.  IMPRESSION: Successful ultrasound guided left thoracentesis yielding 900 mL  of pleural fluid.  Read by: Ascencion Dike PA-C   Electronically Signed   By: Markus Daft M.D.   On: 09/11/2014 14:29    Impression: Patient tolerating chemotherapy well. Treatment with whole brain radiation. Stage IV breast cancer undergoing chemotherapy. Brain mets.   Plan:  X-rays of right leg to check for weakening of the bone recommended. Whole brain radiation treatment recommended. Recommended scan in 3 months following whole brain radiation treatment.   The patient was seen for 15 minutes today with the majority of the time spent on discussion and coordination of care.   This document serves as a record of services personally performed by Jodelle Gross, MD., Ph.D. It was created on his behalf by Derek Mound, a trained medical scribe. The creation of this record is based on the scribe's personal observations and the provider's statements to them. This document has been checked and approved by the attending provider.    Jodelle Gross, M.D., Ph.D.

## 2014-10-02 NOTE — Progress Notes (Signed)
Please see the Nurse Progress Note in the MD Initial Consult Encounter for this patient. 

## 2014-10-02 NOTE — Telephone Encounter (Signed)
VM message from patient requesting conference call with Dr. Jana Hakim and herself and her daughter-in-law regarding her situation. Please schedule. Thx.  Patient's phone # is [phone]

## 2014-10-03 ENCOUNTER — Telehealth: Payer: Self-pay | Admitting: Oncology

## 2014-10-03 ENCOUNTER — Other Ambulatory Visit: Payer: Self-pay | Admitting: Oncology

## 2014-10-03 ENCOUNTER — Telehealth: Payer: Self-pay | Admitting: *Deleted

## 2014-10-03 NOTE — Telephone Encounter (Signed)
POF entered for 4/29 at 8 AM

## 2014-10-03 NOTE — Telephone Encounter (Signed)
Called patient to inform of x-rays on 10-09-14 @ 10:30 am @ [facility], spoke with patient and she was agreeable to this.

## 2014-10-03 NOTE — Telephone Encounter (Signed)
per pof to sch pt appt-gave pt copy of sch °

## 2014-10-04 DIAGNOSIS — Z510 Encounter for antineoplastic radiation therapy: Secondary | ICD-10-CM | POA: Diagnosis not present

## 2014-10-06 ENCOUNTER — Other Ambulatory Visit: Payer: Self-pay | Admitting: Radiation Oncology

## 2014-10-06 ENCOUNTER — Telehealth: Payer: Self-pay | Admitting: Oncology

## 2014-10-06 MED ORDER — DEXAMETHASONE 4 MG PO TABS: ORAL_TABLET | ORAL | Status: DC

## 2014-10-06 NOTE — Telephone Encounter (Signed)
Called Linda [name] and let her know that Dr. Lisbeth Renshaw has sent in a prescription for decadron to the [facility] and would like her to start taking it today or this weekend.  The directions for taking it as well as directions for a taper will be on the prescription.  Linda [name] verbalized understanding and will pick it up today.

## 2014-10-09 ENCOUNTER — Ambulatory Visit
Admission: RE | Admit: 2014-10-09 | Discharge: 2014-10-09 | Disposition: A | Discharge status: Home / Self Care | Payer: Medicare Other | Source: Ambulatory Visit | Attending: Radiation Oncology | Admitting: Radiation Oncology

## 2014-10-09 ENCOUNTER — Encounter: Payer: Self-pay | Admitting: *Deleted

## 2014-10-09 DIAGNOSIS — Z510 Encounter for antineoplastic radiation therapy: Secondary | ICD-10-CM | POA: Diagnosis not present

## 2014-10-09 DIAGNOSIS — C7951 Secondary malignant neoplasm of bone: Secondary | ICD-10-CM

## 2014-10-10 ENCOUNTER — Ambulatory Visit
Admission: RE | Admit: 2014-10-10 | Discharge: 2014-10-10 | Disposition: A | Discharge status: Home / Self Care | Payer: Medicare Other | Source: Ambulatory Visit | Attending: Radiation Oncology | Admitting: Radiation Oncology

## 2014-10-10 DIAGNOSIS — Z510 Encounter for antineoplastic radiation therapy: Secondary | ICD-10-CM | POA: Diagnosis not present

## 2014-10-11 ENCOUNTER — Ambulatory Visit
Admission: RE | Admit: 2014-10-11 | Discharge: 2014-10-11 | Disposition: A | Discharge status: Home / Self Care | Payer: Medicare Other | Source: Ambulatory Visit | Attending: Radiation Oncology | Admitting: Radiation Oncology

## 2014-10-11 DIAGNOSIS — Z510 Encounter for antineoplastic radiation therapy: Secondary | ICD-10-CM | POA: Diagnosis not present

## 2014-10-12 ENCOUNTER — Ambulatory Visit
Admission: RE | Admit: 2014-10-12 | Discharge: 2014-10-12 | Disposition: A | Discharge status: Home / Self Care | Payer: Medicare Other | Source: Ambulatory Visit | Attending: Radiation Oncology | Admitting: Radiation Oncology

## 2014-10-12 DIAGNOSIS — Z510 Encounter for antineoplastic radiation therapy: Secondary | ICD-10-CM | POA: Diagnosis not present

## 2014-10-13 ENCOUNTER — Ambulatory Visit: Payer: Self-pay | Admitting: Oncology

## 2014-10-13 ENCOUNTER — Telehealth: Payer: Self-pay | Admitting: *Deleted

## 2014-10-13 ENCOUNTER — Ambulatory Visit
Admission: RE | Admit: 2014-10-13 | Discharge: 2014-10-13 | Disposition: A | Discharge status: Home / Self Care | Payer: Medicare Other | Source: Ambulatory Visit | Attending: Radiation Oncology | Admitting: Radiation Oncology

## 2014-10-13 ENCOUNTER — Other Ambulatory Visit: Payer: Self-pay | Admitting: *Deleted

## 2014-10-13 ENCOUNTER — Encounter: Payer: Self-pay | Admitting: Radiation Oncology

## 2014-10-13 VITALS — BP 155/65 | HR 73 | Resp 16 | Wt 236.1 lb

## 2014-10-13 DIAGNOSIS — C7931 Secondary malignant neoplasm of brain: Secondary | ICD-10-CM | POA: Insufficient documentation

## 2014-10-13 DIAGNOSIS — Z510 Encounter for antineoplastic radiation therapy: Secondary | ICD-10-CM | POA: Diagnosis not present

## 2014-10-13 NOTE — Telephone Encounter (Signed)
Patient missed rad tx this am, called left vm to call us,asked if she was okay, also Dr. Jana Hakim called and stated she missed her 8am appt today as well, called [facility],spoke with Raquel Sarna, RT therapist,they haven't heard from her either 10:00 AM

## 2014-10-13 NOTE — Telephone Encounter (Signed)
Patient had called back 1/2 hour after my phone message,stating she didn't know of the time change no one told her or she didn't remember, also I informed her that she missed Dr. Virgie Dad 8am appt visit as well, rescheduled her for rad tx today at 415pm 10:54 AM

## 2014-10-13 NOTE — Progress Notes (Signed)
  Radiation Oncology         [phone] ________________________________  Name: Linda [name] MRN: [id number]  Date: 10/13/2014  DOB: 04-30-1946  Weekly Radiation Therapy Management    ICD-9-CM ICD-10-CM   1. Brain metastases 198.3 C79.31 DG Pelvis Comp Min 3V    Current Dose: 12.5 Gy     Planned Dose:  37.5 Gy  Narrative . . . . . . . . The patient presents for routine under treatment assessment. 5 rad tx out of 15 completed so far. Pt reports that her cough has subsided. Pt states she  is on decadron 4mg  1 x day. Pt denies headaches, dizziness, nausea, vomiting, diplopia, or ringing in the ears. The patient states that her hair has started to fall out, but is without additional complaints at this time.                                 Set-up films were reviewed.                                 The chart was checked. Physical Findings. . .  weight is 236 lb 1.6 oz (107.094 kg). Her blood pressure is 155/65 and her pulse is 73. Her respiration is 16. Weight essentially stable.  No significant changes. No thrush present. Impression . . . . . . . The patient is tolerating radiation. Plan . . . . . . . . . . . . Continue treatment as planned. Scheduled a pelvic x-ray to assess bone scan abnormalities in the right hip on 10/16/14.  This document serves as a record of services personally performed by Tyler Pita, MD. It was created on his behalf by Darcus Austin, a trained medical scribe. The creation of this record is based on the scribe's personal observations and the provider's statements to them. This document has been checked and approved by the attending provider.     ________________________________  Sheral Apley. Tammi Klippel, M.D.

## 2014-10-13 NOTE — Progress Notes (Signed)
Reports cough has resolved. Denies headaches, dizziness, nausea or vomiting. Reports taking decadron 4 mg each day. Denies diplopia or ringing in the ears. Steady gait noted. BP elevated.

## 2014-10-13 NOTE — Telephone Encounter (Signed)
error 

## 2014-10-16 ENCOUNTER — Other Ambulatory Visit: Payer: Self-pay | Admitting: Radiation Oncology

## 2014-10-16 ENCOUNTER — Telehealth: Payer: Self-pay | Admitting: *Deleted

## 2014-10-16 ENCOUNTER — Ambulatory Visit
Admission: RE | Admit: 2014-10-16 | Discharge: 2014-10-16 | Disposition: A | Discharge status: Home / Self Care | Payer: Medicare Other | Source: Ambulatory Visit | Attending: Radiation Oncology | Admitting: Radiation Oncology

## 2014-10-16 ENCOUNTER — Ambulatory Visit (HOSPITAL_COMMUNITY)
Admission: RE | Admit: 2014-10-16 | Discharge: 2014-10-16 | Disposition: A | Discharge status: Home / Self Care | Payer: Medicare Other | Source: Ambulatory Visit | Attending: Radiation Oncology | Admitting: Radiation Oncology

## 2014-10-16 ENCOUNTER — Other Ambulatory Visit: Payer: Self-pay

## 2014-10-16 DIAGNOSIS — R948 Abnormal results of function studies of other organs and systems: Secondary | ICD-10-CM | POA: Insufficient documentation

## 2014-10-16 DIAGNOSIS — C7931 Secondary malignant neoplasm of brain: Secondary | ICD-10-CM | POA: Insufficient documentation

## 2014-10-16 DIAGNOSIS — C50911 Malignant neoplasm of unspecified site of right female breast: Secondary | ICD-10-CM

## 2014-10-16 DIAGNOSIS — C50912 Malignant neoplasm of unspecified site of left female breast: Principal | ICD-10-CM

## 2014-10-16 DIAGNOSIS — Z510 Encounter for antineoplastic radiation therapy: Secondary | ICD-10-CM | POA: Diagnosis not present

## 2014-10-16 NOTE — Telephone Encounter (Signed)
Called patient to ask about coming in for a pelvic x-ray,spoke with patient and  she will have this done today @ [facility] after her treatment today

## 2014-10-17 ENCOUNTER — Other Ambulatory Visit: Payer: Self-pay

## 2014-10-17 ENCOUNTER — Ambulatory Visit (HOSPITAL_BASED_OUTPATIENT_CLINIC_OR_DEPARTMENT_OTHER): Payer: Medicare Other

## 2014-10-17 ENCOUNTER — Other Ambulatory Visit: Payer: Self-pay | Admitting: Oncology

## 2014-10-17 ENCOUNTER — Other Ambulatory Visit (HOSPITAL_BASED_OUTPATIENT_CLINIC_OR_DEPARTMENT_OTHER): Payer: Medicare Other

## 2014-10-17 ENCOUNTER — Ambulatory Visit (HOSPITAL_BASED_OUTPATIENT_CLINIC_OR_DEPARTMENT_OTHER): Payer: Medicare Other | Admitting: Nurse Practitioner

## 2014-10-17 ENCOUNTER — Ambulatory Visit
Admission: RE | Admit: 2014-10-17 | Discharge: 2014-10-17 | Disposition: A | Discharge status: Home / Self Care | Payer: Medicare Other | Source: Ambulatory Visit | Attending: Radiation Oncology | Admitting: Radiation Oncology

## 2014-10-17 ENCOUNTER — Telehealth: Payer: Self-pay | Admitting: Oncology

## 2014-10-17 ENCOUNTER — Encounter: Payer: Self-pay | Admitting: Nurse Practitioner

## 2014-10-17 VITALS — BP 155/69 | HR 85 | Temp 97.9°F | Resp 19 | Ht 68.0 in | Wt 237.3 lb

## 2014-10-17 DIAGNOSIS — C50911 Malignant neoplasm of unspecified site of right female breast: Secondary | ICD-10-CM

## 2014-10-17 DIAGNOSIS — J910 Malignant pleural effusion: Secondary | ICD-10-CM

## 2014-10-17 DIAGNOSIS — C50912 Malignant neoplasm of unspecified site of left female breast: Secondary | ICD-10-CM

## 2014-10-17 DIAGNOSIS — Z170 Estrogen receptor positive status [ER+]: Secondary | ICD-10-CM | POA: Diagnosis not present

## 2014-10-17 DIAGNOSIS — C50511 Malignant neoplasm of lower-outer quadrant of right female breast: Secondary | ICD-10-CM

## 2014-10-17 DIAGNOSIS — Z510 Encounter for antineoplastic radiation therapy: Secondary | ICD-10-CM | POA: Diagnosis not present

## 2014-10-17 DIAGNOSIS — C7951 Secondary malignant neoplasm of bone: Secondary | ICD-10-CM | POA: Diagnosis not present

## 2014-10-17 DIAGNOSIS — C7931 Secondary malignant neoplasm of brain: Secondary | ICD-10-CM

## 2014-10-17 DIAGNOSIS — Z5112 Encounter for antineoplastic immunotherapy: Secondary | ICD-10-CM

## 2014-10-17 LAB — CBC WITH DIFFERENTIAL/PLATELET
BASO%: 0 % (ref 0.0–2.0)
Basophils Absolute: 0 10*3/uL (ref 0.0–0.1)
EOS%: 0.3 % (ref 0.0–7.0)
Eosinophils Absolute: 0 10*3/uL (ref 0.0–0.5)
HEMATOCRIT: 35.2 % (ref 34.8–46.6)
HEMOGLOBIN: 11.3 g/dL — AB (ref 11.6–15.9)
LYMPH%: 10.6 % — AB (ref 14.0–49.7)
MCH: 29.7 pg (ref 25.1–34.0)
MCHC: 32.1 g/dL (ref 31.5–36.0)
MCV: 92.4 fL (ref 79.5–101.0)
MONO#: 0.7 10*3/uL (ref 0.1–0.9)
MONO%: 7.1 % (ref 0.0–14.0)
NEUT#: 7.7 10*3/uL — ABNORMAL HIGH (ref 1.5–6.5)
NEUT%: 82 % — AB (ref 38.4–76.8)
PLATELETS: 233 10*3/uL (ref 145–400)
RBC: 3.81 10*6/uL (ref 3.70–5.45)
RDW: 16.9 % — ABNORMAL HIGH (ref 11.2–14.5)
WBC: 9.3 10*3/uL (ref 3.9–10.3)
lymph#: 1 10*3/uL (ref 0.9–3.3)

## 2014-10-17 LAB — COMPREHENSIVE METABOLIC PANEL (CC13)
ALK PHOS: 229 U/L — AB (ref 40–150)
ALT: 28 U/L (ref 0–55)
AST: 13 U/L (ref 5–34)
Albumin: 3.1 g/dL — ABNORMAL LOW (ref 3.5–5.0)
Anion Gap: 9 mEq/L (ref 3–11)
BUN: 20 mg/dL (ref 7.0–26.0)
CO2: 26 mEq/L (ref 22–29)
CREATININE: 0.7 mg/dL (ref 0.6–1.1)
Calcium: 9.2 mg/dL (ref 8.4–10.4)
Chloride: 104 mEq/L (ref 98–109)
EGFR: >90 mL/min/{1.73_m2} (ref >90)
Glucose: 125 mg/dl (ref 70–140)
Potassium: 4 mEq/L (ref 3.5–5.1)
SODIUM: 139 meq/L (ref 136–145)
Total Bilirubin: 0.4 mg/dL (ref 0.20–1.20)
Total Protein: 6.7 g/dL (ref 6.4–8.3)

## 2014-10-17 MED ORDER — SODIUM CHLORIDE 0.9 % IJ SOLN
10.0000 mL | INTRAMUSCULAR | Status: DC | PRN
  Administered 2014-10-17: 10 mL
  Filled 2014-10-17: qty 10

## 2014-10-17 MED ORDER — ACETAMINOPHEN 325 MG PO TABS
650.0000 mg | ORAL_TABLET | Freq: Once | ORAL | Status: AC
  Administered 2014-10-17: 650 mg via ORAL

## 2014-10-17 MED ORDER — TRASTUZUMAB CHEMO INJECTION 440 MG
6.0000 mg/kg | Freq: Once | INTRAVENOUS | Status: AC
  Administered 2014-10-17: 630 mg via INTRAVENOUS
  Filled 2014-10-17: qty 30

## 2014-10-17 MED ORDER — TOBRAMYCIN-DEXAMETHASONE 0.3-0.1 % OP SUSP: 1.0000 [drp] | OPHTHALMIC | Status: DC

## 2014-10-17 MED ORDER — LIDOCAINE-PRILOCAINE 2.5-2.5 % EX CREA: 1.0000 "application " | TOPICAL_CREAM | CUTANEOUS | Status: DC | PRN

## 2014-10-17 MED ORDER — HEPARIN SOD (PORK) LOCK FLUSH 100 UNIT/ML IV SOLN
500.0000 [IU] | Freq: Once | INTRAVENOUS | Status: AC | PRN
  Administered 2014-10-17: 500 [IU]
  Filled 2014-10-17: qty 5

## 2014-10-17 MED ORDER — DIPHENHYDRAMINE HCL 25 MG PO CAPS
25.0000 mg | ORAL_CAPSULE | Freq: Once | ORAL | Status: AC
  Administered 2014-10-17: 25 mg via ORAL

## 2014-10-17 MED ORDER — DIPHENHYDRAMINE HCL 25 MG PO CAPS
ORAL_CAPSULE | ORAL | Status: AC
  Filled 2014-10-17: qty 1

## 2014-10-17 MED ORDER — SODIUM CHLORIDE 0.9 % IV SOLN
Freq: Once | INTRAVENOUS | Status: AC
  Administered 2014-10-17: 10:00:00 via INTRAVENOUS

## 2014-10-17 MED ORDER — SODIUM CHLORIDE 0.9 % IV SOLN
420.0000 mg | Freq: Once | INTRAVENOUS | Status: AC
  Administered 2014-10-17: 420 mg via INTRAVENOUS
  Filled 2014-10-17: qty 14

## 2014-10-17 MED ORDER — ACETAMINOPHEN 325 MG PO TABS
ORAL_TABLET | ORAL | Status: AC
  Filled 2014-10-17: qty 2

## 2014-10-17 NOTE — Patient Instructions (Signed)
Pertuzumab injection What is this medicine? PERTUZUMAB (per TOOZ ue mab) is a monoclonal antibody that targets a protein called HER2. HER2 is found in some breast cancers. This medicine can stop cancer cell growth. This medicine is used with other cancer treatments. This medicine may be used for other purposes; ask your health care provider or pharmacist if you have questions. COMMON BRAND NAME(S): PERJETA What should I tell my health care provider before I take this medicine? They need to know if you have any of these conditions: -heart disease -heart failure -high blood pressure -history of irregular heart beat -recent or ongoing radiation therapy -an unusual or allergic reaction to pertuzumab, other medicines, foods, dyes, or preservatives -pregnant or trying to get pregnant -breast-feeding How should I use this medicine? This medicine is for infusion into a vein. It is given by a health care professional in a hospital or clinic setting. Talk to your pediatrician regarding the use of this medicine in children. Special care may be needed. Overdosage: If you think you've taken too much of this medicine contact a poison control center or emergency room at once. Overdosage: If you think you have taken too much of this medicine contact a poison control center or emergency room at once. NOTE: This medicine is only for you. Do not share this medicine with others. What if I miss a dose? It is important not to miss your dose. Call your doctor or health care professional if you are unable to keep an appointment. What may interact with this medicine? Interactions are not expected. Give your health care provider a list of all the medicines, herbs, non-prescription drugs, or dietary supplements you use. Also tell them if you smoke, drink alcohol, or use illegal drugs. Some items may interact with your medicine. This list may not describe all possible interactions. Give your health care provider a  list of all the medicines, herbs, non-prescription drugs, or dietary supplements you use. Also tell them if you smoke, drink alcohol, or use illegal drugs. Some items may interact with your medicine. What should I watch for while using this medicine? Your condition will be monitored carefully while you are receiving this medicine. Report any side effects. Continue your course of treatment even though you feel ill unless your doctor tells you to stop. Do not become pregnant while taking this medicine. Women should inform their doctor if they wish to become pregnant or think they might be pregnant. There is a potential for serious side effects to an unborn child. Talk to your health care professional or pharmacist for more information. Do not breast-feed an infant while taking this medicine. Call your doctor or health care professional for advice if you get a fever, chills or sore throat, or other symptoms of a cold or flu. Do not treat yourself. Try to avoid being around people who are sick. You may experience fever, chills, and headache during the infusion. Report any side effects during the infusion to your health care professional. What side effects may I notice from receiving this medicine? Side effects that you should report to your doctor or health care professional as soon as possible: -breathing problems -chest pain or palpitations -dizziness -feeling faint or lightheaded -fever or chills -skin rash, itching or hives -sore throat -swelling of the face, lips, or tongue -swelling of the legs or ankles -unusually weak or tired Side effects that usually do not require medical attention (Report these to your doctor or health care professional if they continue or  are bothersome.): -diarrhea -hair loss -nausea, vomiting -tiredness This list may not describe all possible side effects. Call your doctor for medical advice about side effects. You may report side effects to FDA at  1-800-FDA-1088. Where should I keep my medicine? This drug is given in a hospital or clinic and will not be stored at home. NOTE: This sheet is a summary. It may not cover all possible information. If you have questions about this medicine, talk to your doctor, pharmacist, or health care provider.  2015, [employer] Standard. (2012-03-31 16:54:15) Trastuzumab injection for infusion What is this medicine? TRASTUZUMAB (tras TOO zoo mab) is a monoclonal antibody. It targets a protein called HER2. This protein is found in some stomach and breast cancers. This medicine can stop cancer cell growth. This medicine may be used with other cancer treatments. This medicine may be used for other purposes; ask your health care provider or pharmacist if you have questions. COMMON BRAND NAME(S): Herceptin What should I tell my health care provider before I take this medicine? They need to know if you have any of these conditions: -heart disease -heart failure -infection (especially a virus infection such as chickenpox, cold sores, or herpes) -lung or breathing disease, like asthma -recent or ongoing radiation therapy -an unusual or allergic reaction to trastuzumab, benzyl alcohol, or other medications, foods, dyes, or preservatives -pregnant or trying to get pregnant -breast-feeding How should I use this medicine? This drug is given as an infusion into a vein. It is administered in a hospital or clinic by a specially trained health care professional. Talk to your pediatrician regarding the use of this medicine in children. This medicine is not approved for use in children. Overdosage: If you think you have taken too much of this medicine contact a poison control center or emergency room at once. NOTE: This medicine is only for you. Do not share this medicine with others. What if I miss a dose? It is important not to miss a dose. Call your doctor or health care professional if you are unable to keep an  appointment. What may interact with this medicine? -cyclophosphamide -doxorubicin -warfarin This list may not describe all possible interactions. Give your health care provider a list of all the medicines, herbs, non-prescription drugs, or dietary supplements you use. Also tell them if you smoke, drink alcohol, or use illegal drugs. Some items may interact with your medicine. What should I watch for while using this medicine? Visit your doctor for checks on your progress. Report any side effects. Continue your course of treatment even though you feel ill unless your doctor tells you to stop. Call your doctor or health care professional for advice if you get a fever, chills or sore throat, or other symptoms of a cold or flu. Do not treat yourself. Try to avoid being around people who are sick. You may experience fever, chills and shaking during your first infusion. These effects are usually mild and can be treated with other medicines. Report any side effects during the infusion to your health care professional. Fever and chills usually do not happen with later infusions. What side effects may I notice from receiving this medicine? Side effects that you should report to your doctor or other health care professional as soon as possible: -breathing difficulties -chest pain or palpitations -cough -dizziness or fainting -fever or chills, sore throat -skin rash, itching or hives -swelling of the legs or ankles -unusually weak or tired Side effects that usually do not require medical  attention (report to your doctor or other health care professional if they continue or are bothersome): -loss of appetite -headache -muscle aches -nausea This list may not describe all possible side effects. Call your doctor for medical advice about side effects. You may report side effects to FDA at 1-800-FDA-1088. Where should I keep my medicine? This drug is given in a hospital or clinic and will not be stored at  home. NOTE: This sheet is a summary. It may not cover all possible information. If you have questions about this medicine, talk to your doctor, pharmacist, or health care provider.  2015, [employer]/Gold Standard. (2009-04-06 13:43:15)

## 2014-10-17 NOTE — Addendum Note (Signed)
Addended by: Marcelino Duster on: 10/17/2014 06:07 PM   Modules accepted: Orders

## 2014-10-17 NOTE — Progress Notes (Signed)
ID: Linda [name]   DOB: September 19, 1945  MR#: [id number]  PJS#:[id number]  PCP: Leeanne Rio, PA-C GYN: SU: Coralie Keens OTHER MD: Crissie Reese, Linna Hoff Bensimhon  CHIEF COMPLAINT:  Bilateral Breast Cancers  CURRENT TREATMENT: Anastrozole, trastuzumab/pertuzumab, whole brain radiation  BREAST CANCER HISTORY: From the original intake note:  The patient noted a small amount of drainage from her left breast December of 2012. She brought it to her gynecologist's attention in January of 2013 and was set up for bilateral diagnostic mammography at the [facility] July 24, 2011. This was the patient's first ever mammogram. It showed a large irregular mass in the left retroareolar region extending to the nipple, with nipple retraction and skin thickening. This measured approximately 8.4 cm. It was associated with pleomorphic microcalcifications. By exam there was moderate distortion and retraction of the nipple with a large palpable ill-defined area in the retroareolar region. In the right right breast there was also a 2 cm hard mass palpated. Ultrasound of the right breast mass showed a complex cystic/solid area measuring 1.9 cm. Ultrasound of the right axilla was negative. Ultrasound of the left breast showed a large hypoechoic mass measuring at least 3.8 cm. The left axilla showed several adjacent abnormal appearing lymph nodes.  With this information biopsy of the right and left breast masses were obtained the same day, and showed ([id number])   (a) on the right, and invasive ductal carcinoma with papillary features, estrogen and progesterone receptor positive, HER-2 negative, with an MIB-1 of 10%.   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 The MIB-1 was 60% for the left-sided tumor.   With this information the patient was presented at the multidisciplinary breast cancer conference  08/06/2011. Subsequent evaluation and treatments are as detailed below.  INTERVAL HISTORY: [name] returns today for follow up of her metastatic breast cancer, accompanied by a friend and the patient's son who showed up later in the visit. [name] is due for cycle 2 of trastuzumab and pertuzumab today. She tolerated the first round well. She had some lower back pain during the administration, but she is unsure if it was related to the drugs themselves or just a coincidence. Since her last visit she had scans that were positive for brain and bone mets. She is undergoing whole brain radiation daily x 15 treatments now.    REVIEW OF SYSTEMS: [name] denies fevers, chills ,nausea, vomiting, or changes in bowel or bladder habits. Her cough has completely resolved. She continues on omeprazole daily. She denies shortness of breath, chest pain, or palpitations. Her appetite has increased since radiation started her on dexamethasone. She has been weaning herself off of this slowly. She denies headaches, dizziness, vision changes or weakness. She does have some left eye drainage, but has a history of allergies. A detailed review of systems is otherwise stable.   PAST MEDICAL HISTORY: Past Medical History  Diagnosis Date  . Cancer 08-13-11    07-31-11-Dx. Bilarteral Breast cancer-left greater than rt.  . Hematuria, undiagnosed cause 08-13-11    Being evaluated by [facility] 08-14-11  . Hypertension   . Bronchitis     hx  . GERD (gastroesophageal reflux disease)     doing well  . 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 Seroma 02/04/12  right breast  200cc removed  erythema on right side  . Seasonal allergies   . History of radiation therapy 02/20/12-04/15/12    left breast,total 60.4 Gy  . Wears partial dentures     wears upper and lower partial  . Allergy   . Blood transfusion without reported  diagnosis   . 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   . Cancer of central portion of left female breast 08/01/2011    PAST SURGICAL HISTORY: Past Surgical History  Procedure Laterality Date  . Child birth  08-13-11    x3 -NVD  . Portacath placement  08/15/2011    Procedure: INSERTION PORT-A-CATH;  Surgeon: Harl Bowie, MD;  Location: [facility];  Service: General;  Laterality: N/A;  . Mastectomy w/ sentinel node biopsy  01/21/2012    Procedure: MASTECTOMY WITH SENTINEL LYMPH NODE BIOPSY;  Surgeon: Harl Bowie, MD;  Location: [facility];  Service: General;  Laterality: Bilateral;  Left modified radical mastectomy, Rt mastectomy with Sentinel lymphnode biospy  . Breast surgery    . Port-a-cath removal Right 12/22/2012    Procedure: REMOVAL PORT-A-CATH;  Surgeon: Harl Bowie, MD;  Location: [facility];  Service: General;  Laterality: Right;    FAMILY HISTORY Family History  Problem Relation Age of Onset  . Heart disease Mother   . Cancer Mother 50    breast, , 76 deceased  . Heart attack Father   . Cancer Sister 17    breast  . Colon cancer Neg Hx   The patient's father died from a heart attack at the age of 68. The patient's mother died from apparently heart problems at the age of 71. The patient had no brothers. She has 3 sisters. One of her sisters was diagnosed with breast cancer in her mid 51s. The patient does not know if his sister was ever genetically tested. The patient's mother had a mastectomy at the age of 37, presumably for breast cancer. There is no other history of breast or in cancer in the family to her knowledge.   GYNECOLOGIC HISTORY:  She does not recall when she had menarche. She had her first child at age 66. She is GX P3. She underwent menopause in her mid 82s. She never took hormone replacement.   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 She moved to [place] about 2 years ago but has a Games developer in [place]. Son Gerald Stabs lives in [place] and works as a Airline pilot. His wife is a Marine scientist. Son Shanon Brow lives at [place] and is a Advertising account executive in addition to having the [employer]. The patient attends a local [employer] here   ADVANCED DIRECTIVES: Not in place  HEALTH MAINTENANCE: History  Substance Use Topics  . Smoking status: Never Smoker   . Smokeless tobacco: Never Used  . Alcohol Use: 0.0 oz/week    0 Standard drinks or equivalent per week     Comment: rare- occ.     Colonoscopy: Never  PAP: Feb 2013  Bone density: November 2013, normal  Lipid panel:   Allergies  Allergen Reactions  . Ace Inhibitors Cough    Current Outpatient Prescriptions  Medication Sig Dispense Refill  . anastrozole (ARIMIDEX) 1 MG tablet Take 1 mg by mouth daily.    [name] b complex  vitamins tablet Take 1 tablet by mouth daily.    [name] dexamethasone (DECADRON) 4 MG tablet Take with meals: 2 tabs po x1, then 1 tab Q 12 hr x 5 days, then 1 tab po QD x 5 days, then 1/2 tab QD x 5 days. 30 tablet 0  . losartan-hydrochlorothiazide (HYZAAR) 50-12.5 MG per tablet TAKE 1 TABLET BY MOUTH DAILY. 30 tablet 3  . Multiple Vitamins-Iron (MULTIVITAMINS WITH IRON) TABS Take 1 tablet by mouth daily.    [name] omeprazole (PRILOSEC) 40 MG capsule Take 1 capsule (40 mg total) by mouth daily. 90 capsule 4   No current facility-administered medications for this visit.    OBJECTIVE: [name] who appears stated age 10 Vitals:   10/17/14 0913  BP: 155/69  Pulse: 85  Temp: 97.9 F (36.6 C)  Resp: 19     Body mass index is 36.09 kg/(m^2).    ECOG FS: 1 Filed Weights   10/17/14 0913  Weight: 237 lb 4.8 oz (107.639 kg)   Skin: warm, dry  HEENT: sclerae anicteric, conjunctivae pink, oropharynx clear. No thrush or mucositis.  Lymph Nodes: No  cervical or supraclavicular lymphadenopathy  Lungs: clear to auscultation bilaterally, no rales, wheezes, or rhonci  Heart: regular rate and rhythm  Abdomen: round, soft, non tender, positive bowel sounds  Musculoskeletal: No focal spinal tenderness, no peripheral edema  Neuro: non focal, well oriented, positive affect  Breasts: deferred  LAB RESULTS: Lab Results  Component Value Date   WBC 9.3 10/17/2014   NEUTROABS 7.7* 10/17/2014   HGB 11.3* 10/17/2014   HCT 35.2 10/17/2014   MCV 92.4 10/17/2014   PLT 233 10/17/2014      Chemistry      Component Value Date/Time   NA 139 10/17/2014 0854   NA 140 09/21/2014 1244   K 4.0 10/17/2014 0854   K 4.5 09/21/2014 1244   CL 98 09/21/2014 1244   CL 107 11/01/2012 1045   CO2 26 10/17/2014 0854   CO2 32 09/21/2014 1244   BUN 20.0 10/17/2014 0854   BUN 15 09/21/2014 1244   CREATININE 0.7 10/17/2014 0854   CREATININE 0.79 09/21/2014 1244   CREATININE 0.61 06/30/2013 0959      Component Value Date/Time   CALCIUM 9.2 10/17/2014 0854   CALCIUM 9.4 09/21/2014 1244   ALKPHOS 229* 10/17/2014 0854   ALKPHOS 99 09/10/2014 1513   AST 13 10/17/2014 0854   AST 39* 09/10/2014 1513   ALT 28 10/17/2014 0854   ALT 26 09/10/2014 1513   BILITOT 0.40 10/17/2014 0854   BILITOT 0.8 09/10/2014 1513       STUDIES: Mr Jeri Cos Wo Contrast  10-12-14   CLINICAL DATA:  21 -year-old female with breast cancer, staging. Subsequent encounter.  EXAM: MRI HEAD WITHOUT AND WITH CONTRAST  TECHNIQUE: Multiplanar, multiecho pulse sequences of the brain and surrounding structures were obtained without and with intravenous contrast.  CONTRAST:  11m MULTIHANCE GADOBENATE DIMEGLUMINE 529 MG/ML IV SOLN  COMPARISON:  None.  FINDINGS: Numerous small enhancing metastases scattered throughout the brain (approximately 20 metastases in the cerebellum alone. These range in size from punctate to 11 mm diameter.  Milder trace vasogenic edema associated with some of  the larger lesions. Edema is maximal in the right cingulate gyrus and left cerebellum, but there is no significant intracranial mass effect at this time. No definite hemorrhagic metastases.  Superimposed osseous metastatic disease, including among the most conspicuous a 10 mm left frontal bone metastasis on series  6, image 17. No dural thickening or hyper enhancement. No leptomeningeal enhancement identified.  No ventriculomegaly. No restricted diffusion or evidence of acute infarction. Major intracranial vascular flow voids are preserved. Negative pituitary and cervicomedullary junction. Grossly negative visualized cervical spinal cord.  Visible internal auditory structures appear normal. Mastoids are clear. Paranasal sinuses are clear. Orbits soft tissues are within normal limits. Visualized scalp soft tissues are within normal limits.  IMPRESSION: 1. Positive for numerous small enhancing brain metastases, up to 11 mm diameter. Mild associated edema and no significant intracranial mass effect at this time. 2. Positive for calvarium/bone metastases. No dural involvement identified.   Electronically Signed   By: Genevie Ann M.D.   On: 09/25/2014 15:19   Nm Bone Scan Whole Body  09/25/2014   CLINICAL DATA:  Bilateral mastectomy 2013 for breast cancer. Recent diagnosis of metastatic disease to the chest.  EXAM: NUCLEAR MEDICINE WHOLE BODY BONE SCAN  TECHNIQUE: Whole body anterior and posterior images were obtained approximately 3 hours after intravenous injection of radiopharmaceutical.  RADIOPHARMACEUTICALS:  25.0 mCi Technetium-99 MDP  COMPARISON:  None.  FINDINGS: The study shows multiple foci of increased activity related to the skeleton consistent with widespread osseous metastatic disease. There are numerous foci of increased activity scattered throughout the calvarium. There are numerous foci of increased activity scattered throughout the thoracic spine. Low level activity in the lower lumbar spine is  nonspecific and could be degenerative or related to less bulky disease in that region. There are multiple focal areas of activity in the ribs consistent with rib metastases. There is an metastasis in the right humeral diaphysis. There are multiple pelvic metastases, most notable at the right acetabulum and right superior pubic ramus. There is low level activity in the right proximal femur/ femoral neck. There is patchy disease within the diaphyseal region of both femurs. Activity in the right medial femoral condyle could be metastatic or degenerative. Foci of metastases are seen within the tibial diaphysis regions.  IMPRESSION: Metastatic foci scattered throughout the axial and appendicular skeleton as described above. Areas of most potential concern include disease in the thoracic spine, disease in the right acetabulum, right superior pubic ramus and possibly proximal right femur, disease in the right humeral shaft and in both femoral shafts.   Electronically Signed   By: Nelson Chimes M.D.   On: 09/25/2014 14:36   Ir Fluoro Guide Cv Line Right  09/21/2014   CLINICAL DATA:  Breast carcinoma with recurrence in left chest with malignant pleural effusion. The patient requires a porta cath for further chemotherapy needs.  EXAM: IMPLANTED PORT A CATH PLACEMENT WITH ULTRASOUND AND FLUOROSCOPIC GUIDANCE  ANESTHESIA/SEDATION: 2.0 Mg IV Versed; 50 mcg IV Fentanyl  Total Moderate Sedation Time:  46 minutes  Additional Medications: 2 g IV Ancef. As antibiotic prophylaxis, Ancef was ordered pre-procedure and administered intravenously within one hour of incision.  FLUOROSCOPY TIME:  2 minutes and 18 seconds.  PROCEDURE: The procedure, risks, benefits, and alternatives were explained to the patient. Questions regarding the procedure were encouraged and answered. The patient understands and consents to the procedure. A time-out was performed prior to the procedure.  The right neck and chest were prepped with chlorhexidine in a  sterile fashion, and a sterile drape was applied covering the operative field. Maximum barrier sterile technique with sterile gowns and gloves were used for the procedure. Local anesthesia was provided with 1% lidocaine.  Ultrasound was used to confirm patency of the right internal jugular vein. After creating  a small venotomy incision, a 21 gauge needle was advanced into the right internal jugular vein under direct, real-time ultrasound guidance. Ultrasound image documentation was performed. After securing guidewire access, an 8 Fr dilator was placed. A J-wire was kinked to measure appropriate catheter length.  A subcutaneous port pocket was then created along the upper chest wall utilizing sharp and blunt dissection. Portable cautery was utilized. The pocket was irrigated with sterile saline.  A single lumen power injectable port was chosen for placement. The 8 Fr catheter was tunneled from the port pocket site to the venotomy incision. The port was placed in the pocket. External catheter was trimmed to appropriate length based on guidewire measurement.  At the venotomy, an 8 Fr peel-away sheath was placed over a guidewire. The catheter was then placed through the sheath and the sheath removed. Final catheter positioning was confirmed and documented with a fluoroscopic spot image. The port was accessed with a needle and aspirated and flushed with heparinized saline. The needle was removed.  The venotomy and port pocket incisions were closed with subcutaneous 3-0 Monocryl and subcuticular 4-0 Vicryl. Dermabond was applied to both incisions.  COMPLICATIONS: None  FINDINGS: After catheter placement, the tip lies at the cavoatrial junction. The catheter aspirates normally and is ready for immediate use.  IMPRESSION: Placement of single lumen port a cath via right internal jugular vein. The catheter tip lies at the cavoatrial junction. A power injectable port a cath was placed and is ready for immediate use.    Electronically Signed   By: Aletta Edouard M.D.   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 The patient requires a porta cath for further chemotherapy needs.  EXAM: IMPLANTED PORT A CATH PLACEMENT WITH ULTRASOUND AND FLUOROSCOPIC GUIDANCE  ANESTHESIA/SEDATION: 2.0 Mg IV Versed; 50 mcg IV Fentanyl  Total Moderate Sedation Time:  46 minutes  Additional Medications: 2 g IV Ancef. As antibiotic prophylaxis, Ancef was ordered pre-procedure and administered intravenously within one hour of incision.  FLUOROSCOPY TIME:  2 minutes and 18 seconds.  PROCEDURE: The procedure, risks, benefits, and alternatives were explained to the patient. Questions regarding the procedure were encouraged and answered. The patient understands and consents to the procedure. A time-out was performed prior to the procedure.  The right neck and chest were prepped with chlorhexidine in a sterile fashion, and a sterile drape was applied covering the operative field. Maximum barrier sterile technique with sterile gowns and gloves were used for the procedure. Local anesthesia was provided with 1% lidocaine.  Ultrasound was used to confirm patency of the right internal jugular vein. After creating a small venotomy incision, a 21 gauge needle was advanced into the right internal jugular vein under direct, real-time ultrasound guidance. Ultrasound image documentation was performed. After securing guidewire access, an 8 Fr dilator was placed. A J-wire was kinked to measure appropriate catheter length.  A subcutaneous port pocket was then created along the upper chest wall utilizing sharp and blunt dissection. Portable cautery was utilized. The pocket was irrigated with sterile saline.  A single lumen power injectable port was chosen for placement. The 8 Fr catheter was tunneled from the port pocket site to the venotomy incision. The  port was placed in the pocket. External catheter was trimmed to appropriate length based on guidewire measurement.  At the venotomy, an 8 Fr peel-away sheath was placed over  a guidewire. The catheter was then placed through the sheath and the sheath removed. Final catheter positioning was confirmed and documented with a fluoroscopic spot image. The port was accessed with a needle and aspirated and flushed with heparinized saline. The needle was removed.  The venotomy and port pocket incisions were closed with subcutaneous 3-0 Monocryl and subcuticular 4-0 Vicryl. Dermabond was applied to both incisions.  COMPLICATIONS: None  FINDINGS: After catheter placement, the tip lies at the cavoatrial junction. The catheter aspirates normally and is ready for immediate use.  IMPRESSION: Placement of single lumen port a cath via right internal jugular vein. The catheter tip lies at the cavoatrial junction. A power injectable port a cath was placed and is ready for immediate use.   Electronically Signed   By: Aletta Edouard M.D.   On: 09/21/2014 17:41   Dg Hip Unilat With Pelvis 2-3 Views Right  10/16/2014   CLINICAL DATA:  CNS metastasis, abnormal bone scan  EXAM: RIGHT HIP (WITH PELVIS) 2-3 VIEWS  COMPARISON:  Radionuclide bone scan for with 04/2015  FINDINGS: Osseous mineralization grossly normal.  Minimal RIGHT hip joint space narrowing.  No acute fracture or dislocation.  Questionable tiny lytic lesion at the RIGHT superior pubic ramus versus superimposed artifact, seen only on a single image.  No other definite osseous abnormalities identified.  IMPRESSION: Minimal degenerative changes of the RIGHT hip joint.  Questionable subtle lytic lesion versus artifact at the RIGHT superior pubic ramus.   Electronically Signed   By: Lavonia Dana M.D.   On: 10/16/2014 16:05     ASSESSMENT: 69 y.o.  [place] woman   (1)  status post bilateral breast biopsies 07/24/2011, showing,      (a) on the right, a clinical T2 N0  papillary/ductal breast cancer, estrogen and progesterone receptor positive, HER-2 negative, with an MIB-1 of 10%;     (b) on the left, a clinical T3 N1, stage IIIA invasive ductal carcinoma, grade 3, triple positive, with an MIB-1 of 60%.  (2)  Status post 4 dose dense cycles of doxorubicin/ cyclophosphamide followed by 4 dose dense cycles of paclitaxel and trastuzumab completed 12/09/2011  (3) the trastuzumab was continued for a total of one year (to 11/01/2012). Final echo on 11/04/2012 showing a well preserved ejection fraction of 55-60%.  (4) s/p bilateral mastectomies 01/21/2012 showing   (a) on the Right, an 8 mm invasive papillary carcinoma, grade 1, ypT1b ypN0   (b) on the Left, multiple microscopic foci of residual  Invasive ductal carcinoma with evidence of dermal lymphatic involvement, pyT1a/T4 pyN0  (5)  Postmastectomy radiation, completed 04/15/2012  (6) Started anastrazole 04/16/2012; normal dexa scan 05/19/2014 at the [facility] 09/11/2014 (7) CT angiogram 09/11/2014 shows new left pericardial effusion and new mediastinal and hilar lymphadenopathy; there were no suspicious upper abdominal findings Cytology from the left effusion 09/11/2014 ([id number]) shows malignant cells which are HER-2 positive  (8) Whole body bone scan on 09/25/14 showed metastatic foci throughout axial and appendicular skeleton. Areas of most potential concern include disease in the thoracic spine, disease in the right acetabulum, right superior pubic ramus and possibly proximal right femur, disease in the right humeral shaft and in both femoral shafts.  (8) Started trastuzumab/pertuzumab 09/26/2014, to be repeated every 21 days indefinitely  (a) echocardiogram 09/11/2014 shows a well preserved ejection fraction.  (9) Brain MRI on 09/25/14 was positive for numerous small enhancing brain mets and calvarium/bone mets   (a) started whole brain radiation on  10/09/14  PLAN: Pat's  daughter-in-law was not able to attend this visit, and Pat missed her appointment with Dr. Jana Hakim last week, so I have given him her contact information to answer a few questions. The labs were reviewed in detail and were entirely stable. [name] will proceed with cycle 2 of trastuzumab and pertuzumab as planned today. [name] wanted know how many treatments were "planned" and I explained to her that so long as a particular drug is working, she would continue it indefinitely. So far we are planning to do 4 cycles of the trastuzumab and pertuzumab before restaging scans are performed.   [name] will return for labs, cycle 3 of treatment, and an office visit with Dr. Jana Hakim in 3 weeks. I have advised that her daughter-in-law make every effort to attend, or at least send a written versions of her questions to ensure they get answered. Pat understands and agrees with this plan. She knows the goal of treatment in her case is control. She has been encouraged to call with any issues that might arise before her next visit here.   Laurie Panda, NP   10/17/2014

## 2014-10-17 NOTE — Telephone Encounter (Signed)
Appointments made and avs printed for patient °

## 2014-10-18 ENCOUNTER — Ambulatory Visit
Admission: RE | Admit: 2014-10-18 | Discharge: 2014-10-18 | Disposition: A | Discharge status: Home / Self Care | Payer: Medicare Other | Source: Ambulatory Visit | Attending: Radiation Oncology | Admitting: Radiation Oncology

## 2014-10-18 DIAGNOSIS — Z510 Encounter for antineoplastic radiation therapy: Secondary | ICD-10-CM | POA: Diagnosis not present

## 2014-10-19 ENCOUNTER — Ambulatory Visit
Admission: RE | Admit: 2014-10-19 | Discharge: 2014-10-19 | Disposition: A | Discharge status: Home / Self Care | Payer: Medicare Other | Source: Ambulatory Visit | Attending: Radiation Oncology | Admitting: Radiation Oncology

## 2014-10-19 DIAGNOSIS — Z510 Encounter for antineoplastic radiation therapy: Secondary | ICD-10-CM | POA: Diagnosis not present

## 2014-10-20 ENCOUNTER — Encounter: Payer: Self-pay | Admitting: Radiation Oncology

## 2014-10-20 ENCOUNTER — Ambulatory Visit
Admission: RE | Admit: 2014-10-20 | Discharge: 2014-10-20 | Disposition: A | Discharge status: Home / Self Care | Payer: Medicare Other | Source: Ambulatory Visit | Attending: Radiation Oncology | Admitting: Radiation Oncology

## 2014-10-20 VITALS — BP 151/73 | HR 79 | Temp 97.6°F | Resp 20 | Wt 236.6 lb

## 2014-10-20 DIAGNOSIS — L599 Disorder of the skin and subcutaneous tissue related to radiation, unspecified: Secondary | ICD-10-CM | POA: Insufficient documentation

## 2014-10-20 DIAGNOSIS — K219 Gastro-esophageal reflux disease without esophagitis: Secondary | ICD-10-CM | POA: Diagnosis not present

## 2014-10-20 DIAGNOSIS — C7931 Secondary malignant neoplasm of brain: Secondary | ICD-10-CM | POA: Insufficient documentation

## 2014-10-20 DIAGNOSIS — Z853 Personal history of malignant neoplasm of breast: Secondary | ICD-10-CM | POA: Insufficient documentation

## 2014-10-20 DIAGNOSIS — Z9013 Acquired absence of bilateral breasts and nipples: Secondary | ICD-10-CM | POA: Insufficient documentation

## 2014-10-20 DIAGNOSIS — Z923 Personal history of irradiation: Secondary | ICD-10-CM | POA: Insufficient documentation

## 2014-10-20 DIAGNOSIS — Z79811 Long term (current) use of aromatase inhibitors: Secondary | ICD-10-CM | POA: Insufficient documentation

## 2014-10-20 DIAGNOSIS — C50112 Malignant neoplasm of central portion of left female breast: Secondary | ICD-10-CM

## 2014-10-20 DIAGNOSIS — Z79899 Other long term (current) drug therapy: Secondary | ICD-10-CM | POA: Insufficient documentation

## 2014-10-20 DIAGNOSIS — Z510 Encounter for antineoplastic radiation therapy: Secondary | ICD-10-CM | POA: Insufficient documentation

## 2014-10-20 MED ORDER — BIAFINE EX EMUL
Freq: Every day | CUTANEOUS | Status: DC
  Administered 2014-10-20: 13:00:00 via TOPICAL

## 2014-10-20 NOTE — Progress Notes (Addendum)
Weekly rad txs whole brain, scalp pink colored behind left ear pink will give biafine to use daily after rad txs, no c/o pain, vision changes or head aches, gets slight light headed getting up from treatment table after rad tx, sits a minute before standing, Appetite good, decadron tapered to 4mg  daily this week No pain 12:25 PM BP 151/73 mmHg  Pulse 79  Temp(Src) 97.6 F (36.4 C) (Oral)  Resp 20  Wt 236 lb 9.6 oz (107.321 kg)  SpO2 98%  Wt Readings from Last 3 Encounters:  10/17/14 237 lb 4.8 oz (107.639 kg)  10/13/14 236 lb 1.6 oz (107.094 kg)  09/18/14 235 lb 1.6 oz (106.641 kg)

## 2014-10-21 NOTE — Progress Notes (Signed)
   Department of Radiation Oncology  Phone:  [phone] Fax:        [phone]  Weekly Treatment Note    Name: Linda [name] Date: 10/21/2014 MRN: [id number] DOB: [birth year]   Current dose: 25 Gy  Current fraction: 10   MEDICATIONS: Current Outpatient Prescriptions  Medication Sig Dispense Refill  . anastrozole (ARIMIDEX) 1 MG tablet Take 1 mg by mouth daily.    [name] b complex vitamins tablet Take 1 tablet by mouth daily.    [name] dexamethasone (DECADRON) 4 MG tablet Take with meals: 2 tabs po x1, then 1 tab Q 12 hr x 5 days, then 1 tab po QD x 5 days, then 1/2 tab QD x 5 days. 30 tablet 0  . emollient (BIAFINE) cream Apply 1 application topically daily.    [name] lidocaine-prilocaine (EMLA) cream Apply 1 application topically as needed. 30 g 1  . losartan-hydrochlorothiazide (HYZAAR) 50-12.5 MG per tablet TAKE 1 TABLET BY MOUTH DAILY. 30 tablet 3  . Multiple Vitamins-Iron (MULTIVITAMINS WITH IRON) TABS Take 1 tablet by mouth daily.    [name] omeprazole (PRILOSEC) 40 MG capsule Take 1 capsule (40 mg total) by mouth daily. 90 capsule 4  . tobramycin-dexamethasone (TOBRADEX) ophthalmic solution Place 1 drop into the left eye every 4 (four) hours while awake. 5 mL 0   No current facility-administered medications for this encounter.     ALLERGIES: Ace inhibitors   LABORATORY DATA:  Lab Results  Component Value Date   WBC 9.3 10/17/2014   HGB 11.3* 10/17/2014   HCT 35.2 10/17/2014   MCV 92.4 10/17/2014   PLT 233 10/17/2014   Lab Results  Component Value Date   NA 139 10/17/2014   K 4.0 10/17/2014   CL 98 09/21/2014   CO2 26 10/17/2014   Lab Results  Component Value Date   ALT 28 10/17/2014   AST 13 10/17/2014   ALKPHOS 229* 10/17/2014   BILITOT 0.40 10/17/2014     NARRATIVE: Linda [name] was seen today for weekly treatment management. The chart was checked and the patient's films were reviewed.  Weekly rad txs whole brain, scalp pink colored behind left ear pink  will give biafine to use daily after rad txs, no c/o pain, vision changes or head aches, gets slight light headed getting up from treatment table after rad tx, sits a minute before standing, Appetite good, decadron tapered to 4mg  daily this week No pain 7:36 PM BP 151/73 mmHg  Pulse 79  Temp(Src) 97.6 F (36.4 C) (Oral)  Resp 20  Wt 236 lb 9.6 oz (107.321 kg)  SpO2 98%  Wt Readings from Last 3 Encounters:  10/17/14 237 lb 4.8 oz (107.639 kg)  10/13/14 236 lb 1.6 oz (107.094 kg)  09/18/14 235 lb 1.6 oz (106.641 kg)    PHYSICAL EXAMINATION: weight is 236 lb 9.6 oz (107.321 kg). Her oral temperature is 97.6 F (36.4 C). Her blood pressure is 151/73 and her pulse is 79. Her respiration is 20 and oxygen saturation is 98%.        ASSESSMENT: The patient is doing satisfactorily with treatment.  PLAN: We will continue with the patient's radiation treatment as planned.

## 2014-10-23 ENCOUNTER — Ambulatory Visit
Admission: RE | Admit: 2014-10-23 | Discharge: 2014-10-23 | Disposition: A | Discharge status: Home / Self Care | Payer: Medicare Other | Source: Ambulatory Visit | Attending: Radiation Oncology | Admitting: Radiation Oncology

## 2014-10-23 DIAGNOSIS — Z510 Encounter for antineoplastic radiation therapy: Secondary | ICD-10-CM | POA: Diagnosis not present

## 2014-10-24 ENCOUNTER — Ambulatory Visit
Admission: RE | Admit: 2014-10-24 | Discharge: 2014-10-24 | Disposition: A | Discharge status: Home / Self Care | Payer: Medicare Other | Source: Ambulatory Visit | Attending: Radiation Oncology | Admitting: Radiation Oncology

## 2014-10-24 DIAGNOSIS — Z510 Encounter for antineoplastic radiation therapy: Secondary | ICD-10-CM | POA: Diagnosis not present

## 2014-10-25 ENCOUNTER — Ambulatory Visit
Admission: RE | Admit: 2014-10-25 | Discharge: 2014-10-25 | Disposition: A | Discharge status: Home / Self Care | Payer: Medicare Other | Source: Ambulatory Visit | Attending: Radiation Oncology | Admitting: Radiation Oncology

## 2014-10-25 DIAGNOSIS — Z510 Encounter for antineoplastic radiation therapy: Secondary | ICD-10-CM | POA: Diagnosis not present

## 2014-10-26 ENCOUNTER — Ambulatory Visit
Admission: RE | Admit: 2014-10-26 | Discharge: 2014-10-26 | Disposition: A | Discharge status: Home / Self Care | Payer: Medicare Other | Source: Ambulatory Visit | Attending: Radiation Oncology | Admitting: Radiation Oncology

## 2014-10-26 DIAGNOSIS — Z510 Encounter for antineoplastic radiation therapy: Secondary | ICD-10-CM | POA: Diagnosis not present

## 2014-10-27 ENCOUNTER — Encounter: Payer: Self-pay | Admitting: Radiation Oncology

## 2014-10-27 ENCOUNTER — Ambulatory Visit
Admission: RE | Admit: 2014-10-27 | Discharge: 2014-10-27 | Disposition: A | Discharge status: Home / Self Care | Payer: Medicare Other | Source: Ambulatory Visit | Attending: Radiation Oncology | Admitting: Radiation Oncology

## 2014-10-27 ENCOUNTER — Other Ambulatory Visit: Payer: Self-pay | Admitting: Oncology

## 2014-10-27 VITALS — BP 146/96 | HR 80 | Temp 98.1°F | Resp 20 | Ht 68.0 in | Wt 235.5 lb

## 2014-10-27 DIAGNOSIS — Z510 Encounter for antineoplastic radiation therapy: Secondary | ICD-10-CM | POA: Diagnosis not present

## 2014-10-27 DIAGNOSIS — C50911 Malignant neoplasm of unspecified site of right female breast: Secondary | ICD-10-CM

## 2014-10-27 DIAGNOSIS — C7931 Secondary malignant neoplasm of brain: Secondary | ICD-10-CM

## 2014-10-27 NOTE — Progress Notes (Signed)
Weekly rad txs whole brain, 15/15 completed, mild erythema on scalp,uses biafine bid, no pain, no head aches,  no nausea, or blurred vision, appetite good,energy level mild down, , 1 month f/u appt card given 2:25 PM BP 146/96 mmHg  Pulse 80  Temp(Src) 98.1 F (36.7 C) (Oral)  Resp 20  Ht 5\' 8"  (1.727 m)  Wt 235 lb 8 oz (106.822 kg)  BMI 35.82 kg/m2  SpO2 98%  Wt Readings from Last 3 Encounters:  10/27/14 235 lb 8 oz (106.822 kg)  10/17/14 237 lb 4.8 oz (107.639 kg)  10/13/14 236 lb 1.6 oz (107.094 kg)

## 2014-10-27 NOTE — Progress Notes (Signed)
   Department of Radiation Oncology  Phone:  [phone] Fax:        [phone]  Weekly Treatment Note    Name: Linda [name] Date: 10/27/2014 MRN: [id number] DOB: Sep 06, 1945   Current dose: 37.5 Gy  Current fraction:15   MEDICATIONS: Current Outpatient Prescriptions  Medication Sig Dispense Refill  . anastrozole (ARIMIDEX) 1 MG tablet Take 1 mg by mouth daily.    [name] b complex vitamins tablet Take 1 tablet by mouth daily.    [name] dexamethasone (DECADRON) 4 MG tablet Take with meals: 2 tabs po x1, then 1 tab Q 12 hr x 5 days, then 1 tab po QD x 5 days, then 1/2 tab QD x 5 days. 30 tablet 0  . emollient (BIAFINE) cream Apply 1 application topically daily.    [name] lidocaine-prilocaine (EMLA) cream Apply 1 application topically as needed. 30 g 1  . losartan-hydrochlorothiazide (HYZAAR) 50-12.5 MG per tablet TAKE 1 TABLET BY MOUTH DAILY. 30 tablet 3  . Multiple Vitamins-Iron (MULTIVITAMINS WITH IRON) TABS Take 1 tablet by mouth daily.    [name] tobramycin-dexamethasone (TOBRADEX) ophthalmic solution Place 1 drop into the left eye every 4 (four) hours while awake. 5 mL 0  . omeprazole (PRILOSEC) 40 MG capsule Take 1 capsule (40 mg total) by mouth daily. (Patient not taking: Reported on 10/27/2014) 90 capsule 4   No current facility-administered medications for this encounter.     ALLERGIES: Ace inhibitors   LABORATORY DATA:  Lab Results  Component Value Date   WBC 9.3 10/17/2014   HGB 11.3* 10/17/2014   HCT 35.2 10/17/2014   MCV 92.4 10/17/2014   PLT 233 10/17/2014   Lab Results  Component Value Date   NA 139 10/17/2014   K 4.0 10/17/2014   CL 98 09/21/2014   CO2 26 10/17/2014   Lab Results  Component Value Date   ALT 28 10/17/2014   AST 13 10/17/2014   ALKPHOS 229* 10/17/2014   BILITOT 0.40 10/17/2014     NARRATIVE: VERDENE [name] was seen today for weekly treatment management. The chart was checked and the patient's films were reviewed.  Weekly rad txs whole  brain, 15/15 completed, mild erythema on scalp,uses biafine bid, no pain, no head aches, no nausea, or blurred vision, appetite good,energy level mild down, , 1 month f/u appt card given 2:25 PM  PHYSICAL EXAMINATION: height is 5\' 8"  (1.727 m) and weight is 235 lb 8 oz (106.822 kg). Her oral temperature is 98.1 F (36.7 C). Her blood pressure is 146/96 and her pulse is 80. Her respiration is 20 and oxygen saturation is 98%.       No thrush  ASSESSMENT: The patient is doing satisfactorily with treatment.  PLAN: We will continue with the patient's radiation treatment as planned. Advised to taper off on steroid tablet by half per day for six days.  Follow up in 1 month       This document serves as a record of services personally performed by Kyung Rudd, MD. It was created on his behalf by Derek Mound, a trained medical scribe. The creation of this record is based on the scribe's personal observations and the provider's statements to them. This document has been checked and approved by the attending provider.

## 2014-10-31 ENCOUNTER — Ambulatory Visit: Payer: Self-pay | Admitting: Nurse Practitioner

## 2014-10-31 ENCOUNTER — Other Ambulatory Visit: Payer: Self-pay

## 2014-11-07 ENCOUNTER — Ambulatory Visit: Payer: Self-pay

## 2014-11-07 ENCOUNTER — Other Ambulatory Visit: Payer: Self-pay

## 2014-11-08 ENCOUNTER — Ambulatory Visit (HOSPITAL_BASED_OUTPATIENT_CLINIC_OR_DEPARTMENT_OTHER): Payer: Medicare Other

## 2014-11-08 ENCOUNTER — Other Ambulatory Visit (HOSPITAL_BASED_OUTPATIENT_CLINIC_OR_DEPARTMENT_OTHER): Payer: Medicare Other

## 2014-11-08 ENCOUNTER — Telehealth: Payer: Self-pay | Admitting: Oncology

## 2014-11-08 ENCOUNTER — Ambulatory Visit (HOSPITAL_BASED_OUTPATIENT_CLINIC_OR_DEPARTMENT_OTHER): Payer: Medicare Other | Admitting: Oncology

## 2014-11-08 VITALS — BP 126/62 | HR 110 | Temp 98.3°F | Resp 18 | Ht 68.0 in | Wt 235.7 lb

## 2014-11-08 DIAGNOSIS — Z5112 Encounter for antineoplastic immunotherapy: Secondary | ICD-10-CM

## 2014-11-08 DIAGNOSIS — C50911 Malignant neoplasm of unspecified site of right female breast: Secondary | ICD-10-CM

## 2014-11-08 DIAGNOSIS — Z170 Estrogen receptor positive status [ER+]: Secondary | ICD-10-CM | POA: Diagnosis not present

## 2014-11-08 DIAGNOSIS — J910 Malignant pleural effusion: Secondary | ICD-10-CM | POA: Diagnosis not present

## 2014-11-08 DIAGNOSIS — C7931 Secondary malignant neoplasm of brain: Secondary | ICD-10-CM

## 2014-11-08 DIAGNOSIS — C50912 Malignant neoplasm of unspecified site of left female breast: Secondary | ICD-10-CM

## 2014-11-08 DIAGNOSIS — C7951 Secondary malignant neoplasm of bone: Secondary | ICD-10-CM

## 2014-11-08 LAB — COMPREHENSIVE METABOLIC PANEL (CC13)
ALBUMIN: 3.2 g/dL — AB (ref 3.5–5.0)
ALT: 13 U/L (ref 0–55)
AST: 13 U/L (ref 5–34)
Alkaline Phosphatase: 105 U/L (ref 40–150)
Anion Gap: 11 mEq/L (ref 3–11)
BILIRUBIN TOTAL: 0.43 mg/dL (ref 0.20–1.20)
BUN: 11.9 mg/dL (ref 7.0–26.0)
CALCIUM: 9 mg/dL (ref 8.4–10.4)
CO2: 25 mEq/L (ref 22–29)
Chloride: 105 mEq/L (ref 98–109)
Creatinine: 0.7 mg/dL (ref 0.6–1.1)
EGFR: 84 mL/min/{1.73_m2} — AB (ref >90)
GLUCOSE: 131 mg/dL (ref 70–140)
POTASSIUM: 3.8 meq/L (ref 3.5–5.1)
Sodium: 141 mEq/L (ref 136–145)
TOTAL PROTEIN: 6.8 g/dL (ref 6.4–8.3)

## 2014-11-08 LAB — CBC WITH DIFFERENTIAL/PLATELET
BASO%: 0.2 % (ref 0.0–2.0)
BASOS ABS: 0 10*3/uL (ref 0.0–0.1)
EOS ABS: 0.1 10*3/uL (ref 0.0–0.5)
EOS%: 1.4 % (ref 0.0–7.0)
HEMATOCRIT: 35.7 % (ref 34.8–46.6)
HEMOGLOBIN: 11.6 g/dL (ref 11.6–15.9)
LYMPH#: 1.3 10*3/uL (ref 0.9–3.3)
LYMPH%: 26.1 % (ref 14.0–49.7)
MCH: 30.3 pg (ref 25.1–34.0)
MCHC: 32.5 g/dL (ref 31.5–36.0)
MCV: 93.2 fL (ref 79.5–101.0)
MONO#: 0.5 10*3/uL (ref 0.1–0.9)
MONO%: 10.7 % (ref 0.0–14.0)
NEUT#: 3 10*3/uL (ref 1.5–6.5)
NEUT%: 61.6 % (ref 38.4–76.8)
Platelets: 171 10*3/uL (ref 145–400)
RBC: 3.83 10*6/uL (ref 3.70–5.45)
RDW: 17.3 % — AB (ref 11.2–14.5)
WBC: 4.9 10*3/uL (ref 3.9–10.3)

## 2014-11-08 MED ORDER — CALCIUM CARBONATE-VITAMIN D 500-200 MG-UNIT PO TABS: 1.0000 | ORAL_TABLET | Freq: Two times a day (BID) | ORAL | Status: DC

## 2014-11-08 MED ORDER — DIPHENHYDRAMINE HCL 25 MG PO CAPS
ORAL_CAPSULE | ORAL | Status: AC
  Filled 2014-11-08: qty 1

## 2014-11-08 MED ORDER — TRASTUZUMAB CHEMO INJECTION 440 MG
6.0000 mg/kg | Freq: Once | INTRAVENOUS | Status: AC
  Administered 2014-11-08: 630 mg via INTRAVENOUS
  Filled 2014-11-08: qty 30

## 2014-11-08 MED ORDER — SODIUM CHLORIDE 0.9 % IJ SOLN
10.0000 mL | INTRAMUSCULAR | Status: DC | PRN
  Administered 2014-11-08: 10 mL
  Filled 2014-11-08: qty 10

## 2014-11-08 MED ORDER — ACETAMINOPHEN 325 MG PO TABS
ORAL_TABLET | ORAL | Status: AC
  Filled 2014-11-08: qty 2

## 2014-11-08 MED ORDER — PERTUZUMAB CHEMO INJECTION 420 MG/14ML
420.0000 mg | Freq: Once | INTRAVENOUS | Status: AC
  Administered 2014-11-08: 420 mg via INTRAVENOUS
  Filled 2014-11-08: qty 14

## 2014-11-08 MED ORDER — DIPHENHYDRAMINE HCL 25 MG PO CAPS
25.0000 mg | ORAL_CAPSULE | Freq: Once | ORAL | Status: AC
  Administered 2014-11-08: 25 mg via ORAL

## 2014-11-08 MED ORDER — HEPARIN SOD (PORK) LOCK FLUSH 100 UNIT/ML IV SOLN
500.0000 [IU] | Freq: Once | INTRAVENOUS | Status: AC | PRN
  Administered 2014-11-08: 500 [IU]
  Filled 2014-11-08: qty 5

## 2014-11-08 MED ORDER — VITAMIN D 1000 UNITS PO TABS: 1000.0000 [IU] | ORAL_TABLET | Freq: Every day | ORAL | Status: AC

## 2014-11-08 MED ORDER — ACETAMINOPHEN 325 MG PO TABS
650.0000 mg | ORAL_TABLET | Freq: Once | ORAL | Status: AC
  Administered 2014-11-08: 650 mg via ORAL

## 2014-11-08 MED ORDER — SODIUM CHLORIDE 0.9 % IV SOLN
Freq: Once | INTRAVENOUS | Status: AC
  Administered 2014-11-08: 12:00:00 via INTRAVENOUS

## 2014-11-08 NOTE — Progress Notes (Signed)
ID: [name]   DOB: 1945/07/19  MR#: [id number]  XIH#:[id number]  PCP: Leeanne Rio, PA-C GYN: SU: Coralie Keens OTHER MD: Crissie Reese, Linna Hoff Bensimhon  CHIEF COMPLAINT:  Bilateral Breast Cancers  CURRENT TREATMENT: Anastrozole, trastuzumab/pertuzumab  BREAST CANCER HISTORY: From the original intake note:  The patient noted a small amount of drainage from her left breast December of 2012. She brought it to her gynecologist's attention in January of 2013 and was set up for bilateral diagnostic mammography at the [facility] Center July 24, 2011. This was the patient's first ever mammogram. It showed a large irregular mass in the left retroareolar region extending to the nipple, with nipple retraction and skin thickening. This measured approximately 8.4 cm. It was associated with pleomorphic microcalcifications. By exam there was moderate distortion and retraction of the nipple with a large palpable ill-defined area in the retroareolar region. In the right right breast there was also a 2 cm hard mass palpated. Ultrasound of the right breast mass showed a complex cystic/solid area measuring 1.9 cm. Ultrasound of the right axilla was negative. Ultrasound of the left breast showed a large hypoechoic mass measuring at least 3.8 cm. The left axilla showed several adjacent abnormal appearing lymph nodes.  With this information biopsy of the right and left breast masses were obtained the same day, and showed ([id number])   (a) on the right, and invasive ductal carcinoma with papillary features, estrogen and progesterone receptor positive, HER-2 negative, with an MIB-1 of 10%.   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 The MIB-1 was 60% for the left-sided tumor.   With this information the patient was presented at the multidisciplinary breast cancer conference 08/06/2011. Subsequent  evaluation and treatments are as detailed below.  INTERVAL HISTORY: Linda [name] returns today for follow up of her metastatic breast cancer, accompanied by her friend Blanch Media. Since her last visit here Linda [name] completed her radiation treatments, last dose 10/27/2014. She generally tolerated these well. She did have significant erythema of the skin, of course lost her hair, and sometimes at night when she lies in a certain position her skin and scalp feel like "needles". She is tolerating the Arimidex with no side effects that she is aware of in particular early no hot flashes or vaginal dryness problems. She has no side effects from the anti-HER-2 antibodies reviewed her port is working well.[name]    REVIEW OF SYSTEMS: Linda [name] is feeling quite fatigued at present and is not exercising on a regular basis. Sometimes she takes short walks in her neighborhood. There have been no unusual headaches, no nausea or vomiting. If she stands up abruptly she feels a little dizzy. She is careful not to do that. She doesn't have palpitations, chest pain or pressure. There've been no change in bowel or bladder habits. She's having a little bit of "noise" in her left ear she wants me to look at. Detailed review of systems today was otherwise stable.  PAST MEDICAL HISTORY: Past Medical History  Diagnosis Date  . Cancer 08-13-11    07-31-11-Dx. Bilarteral Breast cancer-left greater than rt.  . Hematuria, undiagnosed cause 08-13-11    Being evaluated by [facility] 08-14-11  . Hypertension   . Bronchitis     hx  . GERD (gastroesophageal reflux disease)     doing well  . Breast cancer 07/30/11 dx    Right  invasive ductal ca 7 0'clock,& left  breast=invasive ductal ca  and dcis, left axilla nlymph node, metastatic ca  . Seroma 02/04/12    right breast  200cc removed  erythema on right side  . Seasonal allergies   . History of radiation therapy 02/20/12-04/15/12    left breast,total 60.4 Gy  . Wears partial dentures     wears upper and  lower partial  . Allergy   . Blood transfusion without reported diagnosis   . 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   . Cancer of central portion of left female breast 08/01/2011    PAST SURGICAL HISTORY: Past Surgical History  Procedure Laterality Date  . Child birth  08-13-11    x3 -NVD  . Portacath placement  08/15/2011    Procedure: INSERTION PORT-A-CATH;  Surgeon: Harl Bowie, MD;  Location: [facility];  Service: General;  Laterality: N/A;  . Mastectomy w/ sentinel node biopsy  01/21/2012    Procedure: MASTECTOMY WITH SENTINEL LYMPH NODE BIOPSY;  Surgeon: Harl Bowie, MD;  Location: [facility];  Service: General;  Laterality: Bilateral;  Left modified radical mastectomy, Rt mastectomy with Sentinel lymphnode biospy  . Breast surgery    . Port-a-cath removal Right 12/22/2012    Procedure: REMOVAL PORT-A-CATH;  Surgeon: Harl Bowie, MD;  Location: [facility];  Service: General;  Laterality: Right;    FAMILY HISTORY Family History  Problem Relation Age of Onset  . Heart disease Mother   . Cancer Mother 4    breast, , 50 deceased  . Heart attack Father   . Cancer Sister 55    breast  . Colon cancer Neg Hx   The patient's father died from a heart attack at the age of 68. The patient's mother died from apparently heart problems at the age of 34. The patient had no brothers. She has 3 sisters. One of her sisters was diagnosed with breast cancer in her mid 45s. The patient does not know if his sister was ever genetically tested. The patient's mother had a mastectomy at the age of 39, presumably for breast cancer. There is no other history of breast or in cancer in the family to her knowledge.   GYNECOLOGIC HISTORY:  She does not recall when she had menarche. She had her first child at age 53. She is GX P3. She underwent menopause in her mid 49s. She never took hormone replacement.   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 She moved to [place] about 2 years ago but has a Games developer in [place]. Son Gerald Stabs lives in [place] and works as a Airline pilot. His wife is a Marine scientist. Son Shanon Brow lives at [place] and is a Advertising account executive in addition to having the [employer]. The patient attends a local [employer] here   ADVANCED DIRECTIVES: Not in place  HEALTH MAINTENANCE: History  Substance Use Topics  . Smoking status: Never Smoker   . Smokeless tobacco: Never Used  . Alcohol Use: 0.0 oz/week    0 Standard drinks or equivalent per week     Comment: rare- occ.     Colonoscopy: Never  PAP: Feb 2013  Bone density: November 2013, normal  Lipid panel:   Allergies  Allergen Reactions  . Ace Inhibitors Cough    Current Outpatient Prescriptions  Medication Sig Dispense Refill  .  anastrozole (ARIMIDEX) 1 MG tablet TAKE 1 TABLET BY MOUTH DAILY. 30 tablet 5  . b complex vitamins tablet Take 1 tablet by mouth daily.    [name] dexamethasone (DECADRON) 4 MG tablet Take with meals: 2 tabs po x1, then 1 tab Q 12 hr x 5 days, then 1 tab po QD x 5 days, then 1/2 tab QD x 5 days. 30 tablet 0  . emollient (BIAFINE) cream Apply 1 application topically daily.    [name] lidocaine-prilocaine (EMLA) cream Apply 1 application topically as needed. 30 g 1  . losartan-hydrochlorothiazide (HYZAAR) 50-12.5 MG per tablet TAKE 1 TABLET BY MOUTH DAILY. 30 tablet 3  . Multiple Vitamins-Iron (MULTIVITAMINS WITH IRON) TABS Take 1 tablet by mouth daily.    [name] omeprazole (PRILOSEC) 40 MG capsule Take 1 capsule (40 mg total) by mouth daily. (Patient not taking: Reported on 10/27/2014) 90 capsule 4  . tobramycin-dexamethasone (TOBRADEX) ophthalmic solution Place 1 drop into the left eye every 4 (four) hours while awake. 5 mL 0   No current facility-administered medications for this visit.     OBJECTIVE: [name] in no acute distress Filed Vitals:   11/08/14 0940  BP: 126/62  Pulse: 110  Temp: 98.3 F (36.8 C)  Resp: 18     Body mass index is 35.85 kg/(m^2).    ECOG FS: 1 Filed Weights   11/08/14 0940  Weight: 235 lb 11.2 oz (106.913 kg)   Sclerae unicteric, pupils round and equal Oropharynx clear and moist-- no thrush or other lesions; waxing left ear No cervical or supraclavicular adenopathy Lungs no rales or rhonchi Heart regular rate and rhythm Abd soft, nontender, positive bowel sounds MSK no focal spinal tenderness, no upper extremity lymphedema Neuro: nonfocal, well oriented, appropriate affect Breasts: Deferred   LAB RESULTS: Lab Results  Component Value Date   WBC 4.9 11/08/2014   NEUTROABS 3.0 11/08/2014   HGB 11.6 11/08/2014   HCT 35.7 11/08/2014   MCV 93.2 11/08/2014   PLT 171 11/08/2014      Chemistry      Component Value Date/Time   NA 141 11/08/2014 0927   NA 140 09/21/2014 1244   K 3.8 11/08/2014 0927   K 4.5 09/21/2014 1244   CL 98 09/21/2014 1244   CL 107 11/01/2012 1045   CO2 25 11/08/2014 0927   CO2 32 09/21/2014 1244   BUN 11.9 11/08/2014 0927   BUN 15 09/21/2014 1244   CREATININE 0.7 11/08/2014 0927   CREATININE 0.79 09/21/2014 1244   CREATININE 0.61 06/30/2013 0959      Component Value Date/Time   CALCIUM 9.0 11/08/2014 0927   CALCIUM 9.4 09/21/2014 1244   ALKPHOS 105 11/08/2014 0927   ALKPHOS 99 09/10/2014 1513   AST 13 11/08/2014 0927   AST 39* 09/10/2014 1513   ALT 13 11/08/2014 0927   ALT 26 09/10/2014 1513   BILITOT 0.43 11/08/2014 0927   BILITOT 0.8 09/10/2014 1513       STUDIES: Dg Hip Unilat With Pelvis 2-3 Views Right  10/16/2014   CLINICAL DATA:  CNS metastasis, abnormal bone scan  EXAM: RIGHT HIP (WITH PELVIS) 2-3 VIEWS  COMPARISON:  Radionuclide bone scan for with 04/2015  FINDINGS: Osseous mineralization grossly normal.  Minimal RIGHT hip joint space narrowing.  No acute fracture or  dislocation.  Questionable tiny lytic lesion at the RIGHT superior pubic ramus versus superimposed artifact, seen only on a single image.  No other definite osseous abnormalities identified.  IMPRESSION: Minimal degenerative  changes of the RIGHT hip joint.  Questionable subtle lytic lesion versus artifact at the RIGHT superior pubic ramus.   Electronically Signed   By: Lavonia Dana M.D.   On: 10/16/2014 16:05     ASSESSMENT: 42 y.o.  [place] woman   (1)  status post bilateral breast biopsies 07/24/2011, showing,      (a) on the right, a clinical T2 N0 papillary/ductal breast cancer, estrogen and progesterone receptor positive, HER-2 negative, with an MIB-1 of 10%;     (b) on the left, a clinical T3 N1, stage IIIA invasive ductal carcinoma, grade 3, triple positive, with an MIB-1 of 60%.  (2)  Status post 4 dose dense cycles of doxorubicin/ cyclophosphamide followed by 4 dose dense cycles of paclitaxel and trastuzumab completed 12/09/2011  (3) the trastuzumab was continued for a total of one year (to 11/01/2012). Final echo on 11/04/2012 showing a well preserved ejection fraction of 55-60%.  (4) s/p bilateral mastectomies 01/21/2012 showing   (a) on the Right, an 8 mm invasive papillary carcinoma, grade 1, ypT1b ypN0   (b) on the Left, multiple microscopic foci of residual  Invasive ductal carcinoma with evidence of dermal lymphatic involvement, pyT1a/T4 pyN0  (5)  Postmastectomy radiation, completed 04/15/2012  (6) Started anastrazole 04/16/2012; normal dexa scan 05/19/2014 at the [facility] 09/11/2014 (7) CT angiogram 09/11/2014 shows new left pericardial effusion and new mediastinal and hilar lymphadenopathy; there were no suspicious upper abdominal findings; Cytology from the left effusion 09/11/2014 ([id number]) shows malignant cells which are HER-2 positive  (8) Whole body bone scan on 09/25/14 showed metastatic foci throughout axial and appendicular  skeleton. Areas of most potential concern include disease in the thoracic spine, disease in the right acetabulum, right superior pubic ramus and possibly proximal right femur, disease in the right humeral shaft and in both femoral shafts.  (8) Started trastuzumab/pertuzumab 09/26/2014, to be repeated every 21 days indefinitely  (a) echocardiogram 09/11/2014 shows a well preserved ejection fraction.  (9) Brain MRI on 09/25/14 was positive for numerous small enhancing brain mets and calvarium/bone mets   (a) whole brain radiation on 10/09/14--10/27/2014  (10) to start zolendronate 11/29/2014, to be repeated every 12 weeks  PLAN: Linda [name] is recovering well from the radiation. I think the bits of orthostasis that she experiences are going to be secondary to her losartan and we are stopping that medication for the time being. If her blood pressure does go up significantly of course we would resume it. In the meantime I have asked her to make sure she is well-hydrated.  She is going to be feeling somewhat tired partly because of the treatments and partly because she is now off steroids. She would get out of the goal quicker if she could exercise. Right now she is not quite up to it but hopefully she will in the near future.  Today we discussed adding zolendronate to her treatments. She has a good understanding of the possible toxicities, side effects and complications of this agent including the possibility of osteonecrosis of the jaw. She is going to see her dentist next week and discuss all that with him. We are going to started with the June 15 dose.  Otherwise the plan is to continue the anastrozole and trastuzumab/pertuzumab so long as there is evidence of response. She will be due for repeat echocardiogram in late June. I will see her again in August. Before that visit I will uptake in a repeat brain MRI. If that  shows a good response, we will restage her peripheral disease before her November  visit.  Linda [name] has a good understanding of the overall plan. She agrees with it. She knows the goal of treatment in her case is control. She will call with any problems that may develop before next visit here. Chauncey Cruel, MD   11/08/2014

## 2014-11-08 NOTE — Patient Instructions (Signed)
[facility] Discharge Instructions for Patients Receiving Chemotherapy  Today you received the following chemotherapy agents:  Herceptin, Perjeta  To help prevent nausea and vomiting after your treatment, we encourage you to take your nausea medication as prescribed.   If you develop nausea and vomiting that is not controlled by your nausea medication, call the clinic.   BELOW ARE SYMPTOMS THAT SHOULD BE REPORTED IMMEDIATELY:  *FEVER GREATER THAN 100.5 F  *CHILLS WITH OR WITHOUT FEVER  NAUSEA AND VOMITING THAT IS NOT CONTROLLED WITH YOUR NAUSEA MEDICATION  *UNUSUAL SHORTNESS OF BREATH  *UNUSUAL BRUISING OR BLEEDING  TENDERNESS IN MOUTH AND THROAT WITH OR WITHOUT PRESENCE OF ULCERS  *URINARY PROBLEMS  *BOWEL PROBLEMS  UNUSUAL RASH Items with * indicate a potential emergency and should be followed up as soon as possible.  Feel free to call the clinic you have any questions or concerns. The clinic phone number is [phone].  Please show the CHEMO ALERT CARD at check-in to the Emergency Department and triage nurse.   

## 2014-11-08 NOTE — Telephone Encounter (Signed)
Appointments made and avs printed for patient,echo to [facility] for precert  Linda [name]

## 2014-11-16 NOTE — Progress Notes (Signed)
  Radiation Oncology         [phone] ________________________________  Name: Linda [name] MRN: [id number]  Date: 10/27/2014  DOB: [birth year]  End of Treatment Note  Diagnosis:   Metastatic breast cancer     Indication for treatment::  palliative       Radiation treatment dates:   10/09/14 - 10/27/14  Site/dose:   The patient received whole brain radiation treatment to a dose of 37.5 Gy in 15 fractions.  Narrative: The patient tolerated radiation treatment relatively well.    Plan: The patient has completed radiation treatment. The patient will return to radiation oncology clinic for routine followup in one month. I advised the patient to call or return sooner if they have any questions or concerns related to their recovery or treatment. ________________________________  Jodelle Gross, M.D., Ph.D.

## 2014-11-16 NOTE — Addendum Note (Signed)
Encounter addended by: Kyung Rudd, MD on: 11/16/2014  4:16 PM<BR>     Documentation filed: Notes Section, Visit Diagnoses

## 2014-11-16 NOTE — Progress Notes (Signed)
  Radiation Oncology         [phone] ________________________________  Name: Linda [name] MRN: [id number]  Date: 10/02/2014  DOB: October 11, 1945   Simulation and treatment planning note  The patient presented for simulation for the patient's upcoming course of whole brain radiation treatment. The patient was placed in a supine position and a customized thermoplastic head cast was constructed to aid in patient immobilization during the treatment. This complex treatment device will be used on a daily basis. In this fashion, a CT scan was obtained through the head and neck region and isocenter was placed near midline within the brain.  The patient will be planned to receive a course of whole brain radiation treatment to a dose of 37.5 gray in 15 fractions at 2.5 gray per fraction. To accomplish this, 2 customized blocks have been designed which corresponds to left and right whole brain radiation fields. These 2 complex treatment devices will be used on a daily basis during the course of radiation. A complex isodose plan is requested to insure that the target area is adequately covered in to facilitate optimization of the treatment plan. A forward planning technique will also be evaluated to determine if this approach significantly improves the plan. A total of 4 customized fields will therefore be used.   ________________________________   Jodelle Gross, MD, PhD

## 2014-11-27 ENCOUNTER — Encounter: Payer: Self-pay | Admitting: Radiation Oncology

## 2014-11-28 ENCOUNTER — Telehealth: Payer: Self-pay | Admitting: Oncology

## 2014-11-28 ENCOUNTER — Other Ambulatory Visit: Payer: Self-pay

## 2014-11-28 NOTE — Telephone Encounter (Signed)
Called and left a message with echo and she can get a new schedule on 6/15

## 2014-11-29 ENCOUNTER — Ambulatory Visit (HOSPITAL_BASED_OUTPATIENT_CLINIC_OR_DEPARTMENT_OTHER): Payer: Medicare Other

## 2014-11-29 ENCOUNTER — Ambulatory Visit (HOSPITAL_COMMUNITY)
Admission: RE | Admit: 2014-11-29 | Discharge: 2014-11-29 | Disposition: A | Discharge status: Home / Self Care | Payer: Medicare Other | Source: Ambulatory Visit | Attending: Oncology | Admitting: Oncology

## 2014-11-29 ENCOUNTER — Other Ambulatory Visit (HOSPITAL_BASED_OUTPATIENT_CLINIC_OR_DEPARTMENT_OTHER): Payer: Medicare Other

## 2014-11-29 ENCOUNTER — Other Ambulatory Visit: Payer: Self-pay | Admitting: Oncology

## 2014-11-29 VITALS — BP 149/81 | HR 107 | Temp 97.3°F

## 2014-11-29 DIAGNOSIS — C7931 Secondary malignant neoplasm of brain: Secondary | ICD-10-CM | POA: Diagnosis not present

## 2014-11-29 DIAGNOSIS — C50912 Malignant neoplasm of unspecified site of left female breast: Secondary | ICD-10-CM

## 2014-11-29 DIAGNOSIS — Z5112 Encounter for antineoplastic immunotherapy: Secondary | ICD-10-CM | POA: Diagnosis present

## 2014-11-29 DIAGNOSIS — C50919 Malignant neoplasm of unspecified site of unspecified female breast: Secondary | ICD-10-CM

## 2014-11-29 DIAGNOSIS — C50911 Malignant neoplasm of unspecified site of right female breast: Secondary | ICD-10-CM | POA: Diagnosis present

## 2014-11-29 DIAGNOSIS — J910 Malignant pleural effusion: Secondary | ICD-10-CM

## 2014-11-29 DIAGNOSIS — C7951 Secondary malignant neoplasm of bone: Secondary | ICD-10-CM | POA: Diagnosis not present

## 2014-11-29 LAB — CBC WITH DIFFERENTIAL/PLATELET
BASO%: 0.4 % (ref 0.0–2.0)
Basophils Absolute: 0 10*3/uL (ref 0.0–0.1)
EOS%: 1.7 % (ref 0.0–7.0)
Eosinophils Absolute: 0.1 10*3/uL (ref 0.0–0.5)
HCT: 33.2 % — ABNORMAL LOW (ref 34.8–46.6)
HEMOGLOBIN: 11 g/dL — AB (ref 11.6–15.9)
LYMPH#: 1.7 10*3/uL (ref 0.9–3.3)
LYMPH%: 21.8 % (ref 14.0–49.7)
MCH: 30.4 pg (ref 25.1–34.0)
MCHC: 33 g/dL (ref 31.5–36.0)
MCV: 92.1 fL (ref 79.5–101.0)
MONO#: 0.8 10*3/uL (ref 0.1–0.9)
MONO%: 11 % (ref 0.0–14.0)
NEUT#: 5 10*3/uL (ref 1.5–6.5)
NEUT%: 65.1 % (ref 38.4–76.8)
PLATELETS: 267 10*3/uL (ref 145–400)
RBC: 3.6 10*6/uL — ABNORMAL LOW (ref 3.70–5.45)
RDW: 17.9 % — AB (ref 11.2–14.5)
WBC: 7.6 10*3/uL (ref 3.9–10.3)

## 2014-11-29 LAB — COMPREHENSIVE METABOLIC PANEL (CC13)
ALBUMIN: 3.2 g/dL — AB (ref 3.5–5.0)
ALK PHOS: 100 U/L (ref 40–150)
ALT: 19 U/L (ref 0–55)
AST: 16 U/L (ref 5–34)
Anion Gap: 10 mEq/L (ref 3–11)
BUN: 12.4 mg/dL (ref 7.0–26.0)
CO2: 24 mEq/L (ref 22–29)
Calcium: 9 mg/dL (ref 8.4–10.4)
Chloride: 108 mEq/L (ref 98–109)
Creatinine: 0.7 mg/dL (ref 0.6–1.1)
EGFR: 84 mL/min/{1.73_m2} — AB (ref >90)
Glucose: 135 mg/dl (ref 70–140)
POTASSIUM: 3.6 meq/L (ref 3.5–5.1)
SODIUM: 142 meq/L (ref 136–145)
TOTAL PROTEIN: 6.7 g/dL (ref 6.4–8.3)
Total Bilirubin: 0.45 mg/dL (ref 0.20–1.20)

## 2014-11-29 MED ORDER — TRASTUZUMAB CHEMO INJECTION 440 MG
6.0000 mg/kg | Freq: Once | INTRAVENOUS | Status: AC
  Administered 2014-11-29: 630 mg via INTRAVENOUS
  Filled 2014-11-29: qty 30

## 2014-11-29 MED ORDER — DIPHENHYDRAMINE HCL 25 MG PO CAPS
25.0000 mg | ORAL_CAPSULE | Freq: Once | ORAL | Status: AC
  Administered 2014-11-29: 25 mg via ORAL

## 2014-11-29 MED ORDER — SODIUM CHLORIDE 0.9 % IV SOLN
Freq: Once | INTRAVENOUS | Status: AC
  Administered 2014-11-29: 13:00:00 via INTRAVENOUS

## 2014-11-29 MED ORDER — ZOLEDRONIC ACID 4 MG/100ML IV SOLN
4.0000 mg | Freq: Once | INTRAVENOUS | Status: AC
  Administered 2014-11-29: 4 mg via INTRAVENOUS
  Filled 2014-11-29: qty 100

## 2014-11-29 MED ORDER — SODIUM CHLORIDE 0.9 % IV SOLN
420.0000 mg | Freq: Once | INTRAVENOUS | Status: AC
  Administered 2014-11-29: 420 mg via INTRAVENOUS
  Filled 2014-11-29: qty 14

## 2014-11-29 MED ORDER — SODIUM CHLORIDE 0.9 % IJ SOLN
10.0000 mL | INTRAMUSCULAR | Status: DC | PRN
  Administered 2014-11-29: 10 mL
  Filled 2014-11-29: qty 10

## 2014-11-29 MED ORDER — ACETAMINOPHEN 325 MG PO TABS
650.0000 mg | ORAL_TABLET | Freq: Once | ORAL | Status: AC
  Administered 2014-11-29: 650 mg via ORAL

## 2014-11-29 MED ORDER — ACETAMINOPHEN 325 MG PO TABS
ORAL_TABLET | ORAL | Status: AC
  Filled 2014-11-29: qty 2

## 2014-11-29 MED ORDER — DIPHENHYDRAMINE HCL 25 MG PO CAPS
ORAL_CAPSULE | ORAL | Status: AC
  Filled 2014-11-29: qty 1

## 2014-11-29 MED ORDER — HEPARIN SOD (PORK) LOCK FLUSH 100 UNIT/ML IV SOLN
500.0000 [IU] | Freq: Once | INTRAVENOUS | Status: AC | PRN
  Administered 2014-11-29: 500 [IU]
  Filled 2014-11-29: qty 5

## 2014-11-29 NOTE — Patient Instructions (Signed)
[facility] Discharge Instructions for Patients Receiving Chemotherapy  Today you received the following chemotherapy agents Herceptin/Perjeta.  To help prevent nausea and vomiting after your treatment, we encourage you to take your nausea medication as prescribed.   If you develop nausea and vomiting that is not controlled by your nausea medication, call the clinic.   BELOW ARE SYMPTOMS THAT SHOULD BE REPORTED IMMEDIATELY:  *FEVER GREATER THAN 100.5 F  *CHILLS WITH OR WITHOUT FEVER  NAUSEA AND VOMITING THAT IS NOT CONTROLLED WITH YOUR NAUSEA MEDICATION  *UNUSUAL SHORTNESS OF BREATH  *UNUSUAL BRUISING OR BLEEDING  TENDERNESS IN MOUTH AND THROAT WITH OR WITHOUT PRESENCE OF ULCERS  *URINARY PROBLEMS  *BOWEL PROBLEMS  UNUSUAL RASH Items with * indicate a potential emergency and should be followed up as soon as possible.  Feel free to call the clinic you have any questions or concerns. The clinic phone number is [phone].  Please show the [facility] at check-in to the Emergency Department and triage nurse.  Zoledronic Acid injection (Hypercalcemia, Oncology) What is this medicine? ZOLEDRONIC ACID (ZOE le dron ik AS id) lowers the amount of calcium loss from bone. It is used to treat too much calcium in your blood from cancer. It is also used to prevent complications of cancer that has spread to the bone. This medicine may be used for other purposes; ask your health care provider or pharmacist if you have questions. COMMON BRAND NAME(S): Zometa What should I tell my health care provider before I take this medicine? They need to know if you have any of these conditions: -aspirin-sensitive asthma -cancer, especially if you are receiving medicines used to treat cancer -dental disease or wear dentures -infection -kidney disease -receiving corticosteroids like dexamethasone or prednisone -an unusual or allergic reaction to zoledronic acid, other  medicines, foods, dyes, or preservatives -pregnant or trying to get pregnant -breast-feeding How should I use this medicine? This medicine is for infusion into a vein. It is given by a health care professional in a hospital or clinic setting. Talk to your pediatrician regarding the use of this medicine in children. Special care may be needed. Overdosage: If you think you have taken too much of this medicine contact a poison control center or emergency room at once. NOTE: This medicine is only for you. Do not share this medicine with others. What if I miss a dose? It is important not to miss your dose. Call your doctor or health care professional if you are unable to keep an appointment. What may interact with this medicine? -certain antibiotics given by injection -NSAIDs, medicines for pain and inflammation, like ibuprofen or naproxen -some diuretics like bumetanide, furosemide -teriparatide -thalidomide This list may not describe all possible interactions. Give your health care provider a list of all the medicines, herbs, non-prescription drugs, or dietary supplements you use. Also tell them if you smoke, drink alcohol, or use illegal drugs. Some items may interact with your medicine. What should I watch for while using this medicine? Visit your doctor or health care professional for regular checkups. It may be some time before you see the benefit from this medicine. Do not stop taking your medicine unless your doctor tells you to. Your doctor may order blood tests or other tests to see how you are doing. Women should inform their doctor if they wish to become pregnant or think they might be pregnant. There is a potential for serious side effects to an unborn child. Talk to your  health care professional or pharmacist for more information. You should make sure that you get enough calcium and vitamin D while you are taking this medicine. Discuss the foods you eat and the vitamins you take with  your health care professional. Some people who take this medicine have severe bone, joint, and/or muscle pain. This medicine may also increase your risk for jaw problems or a broken thigh bone. Tell your doctor right away if you have severe pain in your jaw, bones, joints, or muscles. Tell your doctor if you have any pain that does not go away or that gets worse. Tell your dentist and dental surgeon that you are taking this medicine. You should not have major dental surgery while on this medicine. See your dentist to have a dental exam and fix any dental problems before starting this medicine. Take good care of your teeth while on this medicine. Make sure you see your dentist for regular follow-up appointments. What side effects may I notice from receiving this medicine? Side effects that you should report to your doctor or health care professional as soon as possible: -allergic reactions like skin rash, itching or hives, swelling of the face, lips, or tongue -anxiety, confusion, or depression -breathing problems -changes in vision -eye pain -feeling faint or lightheaded, falls -jaw pain, especially after dental work -mouth sores -muscle cramps, stiffness, or weakness -trouble passing urine or change in the amount of urine Side effects that usually do not require medical attention (report to your doctor or health care professional if they continue or are bothersome): -bone, joint, or muscle pain -constipation -diarrhea -fever -hair loss -irritation at site where injected -loss of appetite -nausea, vomiting -stomach upset -trouble sleeping -trouble swallowing -weak or tired This list may not describe all possible side effects. Call your doctor for medical advice about side effects. You may report side effects to FDA at 1-800-FDA-1088. Where should I keep my medicine? This drug is given in a hospital or clinic and will not be stored at home. NOTE: This sheet is a summary. It may not  cover all possible information. If you have questions about this medicine, talk to your doctor, pharmacist, or health care provider.  2015, [employer]. (2012-11-11 13:03:13)

## 2014-12-04 ENCOUNTER — Encounter: Payer: Self-pay | Admitting: Radiation Oncology

## 2014-12-04 ENCOUNTER — Ambulatory Visit
Admission: RE | Admit: 2014-12-04 | Discharge: 2014-12-04 | Disposition: A | Discharge status: Home / Self Care | Payer: Medicare Other | Source: Ambulatory Visit | Attending: Radiation Oncology | Admitting: Radiation Oncology

## 2014-12-04 VITALS — BP 132/78 | HR 110 | Temp 98.0°F | Resp 24 | Ht 68.0 in | Wt 261.2 lb

## 2014-12-04 DIAGNOSIS — C7931 Secondary malignant neoplasm of brain: Secondary | ICD-10-CM

## 2014-12-04 HISTORY — DX: Personal history of irradiation: Z92.3

## 2014-12-04 NOTE — Progress Notes (Addendum)
Follow up s/p rad tx whole brain 10/09/14-10/27/14, no c/o head aches, dizzynes, vision changes, nuasea, does c/o sob, rr= 24 when ambulating to room, 96% room air sats, vitals wnl, no c/o paion, appetite fair,  Takes arimidex 1mg  oral daily, energy level  Poor addendum: pt also c./o constipation no bm 3 days, notified MD increased weight gain 1 month, =26 lbs  BP 132/78 mmHg  Pulse 110  Temp(Src) 98 F (36.7 C) (Oral)  Resp 24  Ht 5\' 8"  (1.727 m)  Wt 261 lb 3.2 oz (118.48 kg)  BMI 39.72 kg/m2  SpO2 96%  Wt Readings from Last 3 Encounters:  12/04/14 261 lb 3.2 oz (118.48 kg)  11/08/14 235 lb 11.2 oz (106.913 kg)  10/27/14 235 lb 8 oz (106.822 kg)  9:32 AM

## 2014-12-04 NOTE — Progress Notes (Signed)
Radiation Oncology         [phone] ________________________________  Name: Linda [name] MRN: [id number]  Date: 12/04/2014  DOB: March 02, 1946  Follow-Up Visit Note  CC: Leeanne Rio, PA-C  Magrinat, Virgie Dad, MD  Diagnosis:   Stage IV breast cancer with brain metastases  Interval Since Last Radiation:  1 month   Narrative:  The patient returns today for routine follow-up.  Follow up s/p rad tx whole brain 10/09/14-10/27/14, no c/o head aches, dizzynes, vision changes, nausea, does c/o sob, rr= 24 when ambulating to room, 96% room air sats, vitals wnl, no c/o paion, appetite fair, Takes arimidex 1mg  oral daily, energy level Poor. Constipation, no bowel movement in 3 days.                                ALLERGIES:  is allergic to ace inhibitors.  Meds: Current Outpatient Prescriptions  Medication Sig Dispense Refill  . anastrozole (ARIMIDEX) 1 MG tablet TAKE 1 TABLET BY MOUTH DAILY. 30 tablet 5  . b complex vitamins tablet Take 1 tablet by mouth daily.    . calcium-vitamin D (OSCAL WITH D) 500-200 MG-UNIT per tablet Take 1 tablet by mouth 2 (two) times daily. 100 tablet 12  . cholecalciferol (VITAMIN D) 1000 UNITS tablet Take 1 tablet (1,000 Units total) by mouth daily. 100 tablet 12  . emollient (BIAFINE) cream Apply 1 application topically daily.    [name] lidocaine-prilocaine (EMLA) cream Apply 1 application topically as needed. 30 g 1  . Multiple Vitamins-Iron (MULTIVITAMINS WITH IRON) TABS Take 1 tablet by mouth daily.     No current facility-administered medications for this encounter.    Physical Findings: The patient is in no acute distress. Patient is alert and oriented.  height is 5\' 8"  (1.727 m) and weight is 261 lb 3.2 oz (118.48 kg). Her oral temperature is 98 F (36.7 C). Her blood pressure is 132/78 and her pulse is 110. Her respiration is 24 and oxygen saturation is 96%. .     Lab Findings: Lab Results  Component Value Date   WBC 7.6 11/29/2014   HGB  11.0* 11/29/2014   HCT 33.2* 11/29/2014   MCV 92.1 11/29/2014   PLT 267 11/29/2014     Radiographic Findings: Dg Chest 2 View  11/29/2014   CLINICAL DATA:  Breast cancer  EXAM: CHEST  2 VIEW  COMPARISON:  09/11/2014  FINDINGS: Small left pleural effusion shows mild interval improvement. Mild left lower lobe atelectasis also improved. Minimal pleural effusion on the right.  Cardiac enlargement. Pericardial effusion noted on prior CT. Negative for heart failure or edema. Port-A-Cath tip in the lower SVC.  IMPRESSION: Mild improvement in left pleural effusion and left lower lobe atelectasis. Minimal right pleural effusion.  Cardiac enlargement consistent with pericardial effusion.   Electronically Signed   By: Franchot Gallo M.D.   On: 11/29/2014 15:58    Impression: Patient is tolerating treatment with low energy and shortness of breath. Chest X-rays does not show any evidence of major changes.    Plan:  Echocardiogram on 12/13/14 scheduled by Dr.Magrinat. Follow-up brain MRI in 2 months.    Jodelle Gross, M.D., Ph.D.     This document serves as a record of services personally performed by Kyung Rudd, MD. It was created on his behalf by Derek Mound, a trained medical scribe. The creation of this record is based on the scribe's personal observations  and the provider's statements to them. This document has been checked and approved by the attending provider.

## 2014-12-08 ENCOUNTER — Emergency Department (HOSPITAL_BASED_OUTPATIENT_CLINIC_OR_DEPARTMENT_OTHER): Admit: 2014-12-08 | Discharge: 2014-12-08 | Disposition: A | Discharge status: Home / Self Care | Payer: Medicare Other

## 2014-12-08 ENCOUNTER — Encounter (HOSPITAL_COMMUNITY): Payer: Self-pay | Admitting: Emergency Medicine

## 2014-12-08 ENCOUNTER — Other Ambulatory Visit: Payer: Self-pay

## 2014-12-08 ENCOUNTER — Emergency Department (HOSPITAL_COMMUNITY): Payer: Medicare Other

## 2014-12-08 ENCOUNTER — Ambulatory Visit (HOSPITAL_BASED_OUTPATIENT_CLINIC_OR_DEPARTMENT_OTHER): Payer: Medicare Other | Admitting: Nurse Practitioner

## 2014-12-08 ENCOUNTER — Inpatient Hospital Stay (HOSPITAL_COMMUNITY)
Admission: EM | Admit: 2014-12-08 | Discharge: 2014-12-09 | DRG: 187 | Disposition: A | Discharge status: Home / Self Care | Payer: Medicare Other | Attending: Internal Medicine | Admitting: Internal Medicine

## 2014-12-08 ENCOUNTER — Telehealth: Payer: Self-pay

## 2014-12-08 ENCOUNTER — Other Ambulatory Visit (HOSPITAL_BASED_OUTPATIENT_CLINIC_OR_DEPARTMENT_OTHER): Payer: Medicare Other | Admitting: Lab

## 2014-12-08 VITALS — BP 142/80 | HR 103 | Temp 97.7°F | Resp 20 | Wt 264.3 lb

## 2014-12-08 DIAGNOSIS — C50912 Malignant neoplasm of unspecified site of left female breast: Secondary | ICD-10-CM

## 2014-12-08 DIAGNOSIS — R0602 Shortness of breath: Secondary | ICD-10-CM | POA: Diagnosis present

## 2014-12-08 DIAGNOSIS — Z8249 Family history of ischemic heart disease and other diseases of the circulatory system: Secondary | ICD-10-CM

## 2014-12-08 DIAGNOSIS — J910 Malignant pleural effusion: Secondary | ICD-10-CM

## 2014-12-08 DIAGNOSIS — I972 Postmastectomy lymphedema syndrome: Secondary | ICD-10-CM | POA: Diagnosis present

## 2014-12-08 DIAGNOSIS — Z809 Family history of malignant neoplasm, unspecified: Secondary | ICD-10-CM

## 2014-12-08 DIAGNOSIS — C50911 Malignant neoplasm of unspecified site of right female breast: Secondary | ICD-10-CM

## 2014-12-08 DIAGNOSIS — I5032 Chronic diastolic (congestive) heart failure: Secondary | ICD-10-CM | POA: Diagnosis present

## 2014-12-08 DIAGNOSIS — J948 Other specified pleural conditions: Secondary | ICD-10-CM | POA: Diagnosis not present

## 2014-12-08 DIAGNOSIS — R601 Generalized edema: Secondary | ICD-10-CM | POA: Diagnosis not present

## 2014-12-08 DIAGNOSIS — J90 Pleural effusion, not elsewhere classified: Secondary | ICD-10-CM | POA: Diagnosis present

## 2014-12-08 DIAGNOSIS — M7989 Other specified soft tissue disorders: Secondary | ICD-10-CM | POA: Diagnosis present

## 2014-12-08 DIAGNOSIS — R34 Anuria and oliguria: Secondary | ICD-10-CM

## 2014-12-08 DIAGNOSIS — Z923 Personal history of irradiation: Secondary | ICD-10-CM | POA: Diagnosis not present

## 2014-12-08 DIAGNOSIS — R609 Edema, unspecified: Secondary | ICD-10-CM

## 2014-12-08 DIAGNOSIS — I10 Essential (primary) hypertension: Secondary | ICD-10-CM | POA: Diagnosis present

## 2014-12-08 LAB — CBC WITH DIFFERENTIAL/PLATELET
BASO%: 0.3 % (ref 0.0–2.0)
BASOS ABS: 0 10*3/uL (ref 0.0–0.1)
BASOS ABS: 0 10*3/uL (ref 0.0–0.1)
Basophils Relative: 0 % (ref 0–1)
EOS%: 1.3 % (ref 0.0–7.0)
Eosinophils Absolute: 0.1 10*3/uL (ref 0.0–0.5)
Eosinophils Absolute: 0.1 10*3/uL (ref 0.0–0.7)
Eosinophils Relative: 2 % (ref 0–5)
HCT: 33.5 % — ABNORMAL LOW (ref 36.0–46.0)
HEMATOCRIT: 33.3 % — AB (ref 34.8–46.6)
HGB: 10.7 g/dL — ABNORMAL LOW (ref 11.6–15.9)
Hemoglobin: 10.3 g/dL — ABNORMAL LOW (ref 12.0–15.0)
LYMPH#: 1.2 10*3/uL (ref 0.9–3.3)
LYMPH%: 17.9 % (ref 14.0–49.7)
LYMPHS ABS: 1.4 10*3/uL (ref 0.7–4.0)
Lymphocytes Relative: 20 % (ref 12–46)
MCH: 30.5 pg (ref 25.1–34.0)
MCH: 29 pg (ref 26.0–34.0)
MCHC: 32.1 g/dL (ref 31.5–36.0)
MCHC: 30.7 g/dL (ref 30.0–36.0)
MCV: 94.9 fL (ref 79.5–101.0)
MCV: 94.4 fL (ref 78.0–100.0)
MONO ABS: 0.7 10*3/uL (ref 0.1–1.0)
MONO#: 0.8 10*3/uL (ref 0.1–0.9)
MONO%: 11.2 % (ref 0.0–14.0)
Monocytes Relative: 9 % (ref 3–12)
NEUT#: 4.6 10*3/uL (ref 1.5–6.5)
NEUT%: 69.3 % (ref 38.4–76.8)
Neutro Abs: 4.9 10*3/uL (ref 1.7–7.7)
Neutrophils Relative %: 69 % (ref 43–77)
Platelets: 238 10*3/uL (ref 145–400)
Platelets: 257 10*3/uL (ref 150–400)
RBC: 3.51 10*6/uL — AB (ref 3.70–5.45)
RBC: 3.55 MIL/uL — ABNORMAL LOW (ref 3.87–5.11)
RDW: 16.9 % — AB (ref 11.2–14.5)
RDW: 16.7 % — AB (ref 11.5–15.5)
WBC: 6.7 10*3/uL (ref 3.9–10.3)
WBC: 7.1 10*3/uL (ref 4.0–10.5)

## 2014-12-08 LAB — COMPREHENSIVE METABOLIC PANEL (CC13)
ALT: 32 U/L (ref 0–55)
ANION GAP: 9 meq/L (ref 3–11)
AST: 22 U/L (ref 5–34)
Albumin: 3.4 g/dL — ABNORMAL LOW (ref 3.5–5.0)
Alkaline Phosphatase: 84 U/L (ref 40–150)
BUN: 16 mg/dL (ref 7.0–26.0)
CHLORIDE: 104 meq/L (ref 98–109)
CO2: 23 meq/L (ref 22–29)
CREATININE: 0.8 mg/dL (ref 0.6–1.1)
Calcium: 8.7 mg/dL (ref 8.4–10.4)
EGFR: 78 mL/min/{1.73_m2} — AB (ref >90)
GLUCOSE: 122 mg/dL (ref 70–140)
POTASSIUM: 4 meq/L (ref 3.5–5.1)
Sodium: 136 mEq/L (ref 136–145)
Total Bilirubin: 0.47 mg/dL (ref 0.20–1.20)
Total Protein: 6.6 g/dL (ref 6.4–8.3)

## 2014-12-08 LAB — COMPREHENSIVE METABOLIC PANEL
ALT: 33 U/L (ref 14–54)
ANION GAP: 8 (ref 5–15)
AST: 24 U/L (ref 15–41)
Albumin: 3.8 g/dL (ref 3.5–5.0)
Alkaline Phosphatase: 80 U/L (ref 38–126)
BUN: 17 mg/dL (ref 6–20)
CALCIUM: 8.6 mg/dL — AB (ref 8.9–10.3)
CO2: 25 mmol/L (ref 22–32)
Chloride: 103 mmol/L (ref 101–111)
Creatinine, Ser: 0.76 mg/dL (ref 0.44–1.00)
GFR calc non Af Amer: >60 mL/min (ref >60)
GLUCOSE: 119 mg/dL — AB (ref 65–99)
Potassium: 3.9 mmol/L (ref 3.5–5.1)
SODIUM: 136 mmol/L (ref 135–145)
Total Bilirubin: 0.8 mg/dL (ref 0.3–1.2)
Total Protein: 7 g/dL (ref 6.5–8.1)

## 2014-12-08 LAB — TROPONIN I: Troponin I: <0.03 ng/mL (ref <0.031)

## 2014-12-08 LAB — BRAIN NATRIURETIC PEPTIDE: B Natriuretic Peptide: 29.3 pg/mL (ref 0.0–100.0)

## 2014-12-08 MED ORDER — B COMPLEX-C PO TABS
1.0000 | ORAL_TABLET | Freq: Every day | ORAL | Status: DC
  Administered 2014-12-09: 1 via ORAL
  Filled 2014-12-08: qty 1

## 2014-12-08 MED ORDER — ACETAMINOPHEN 325 MG PO TABS: 650.0000 mg | ORAL_TABLET | Freq: Four times a day (QID) | ORAL | Status: DC | PRN

## 2014-12-08 MED ORDER — HYDROMORPHONE HCL 1 MG/ML IJ SOLN: 0.5000 mg | INTRAMUSCULAR | Status: DC | PRN

## 2014-12-08 MED ORDER — HYDROCOD POLST-CPM POLST ER 10-8 MG/5ML PO SUER: 5.0000 mL | Freq: Two times a day (BID) | ORAL | Status: DC

## 2014-12-08 MED ORDER — SODIUM CHLORIDE 0.9 % IJ SOLN
3.0000 mL | Freq: Two times a day (BID) | INTRAMUSCULAR | Status: DC
  Administered 2014-12-08 – 2014-12-09 (×2): 3 mL via INTRAVENOUS

## 2014-12-08 MED ORDER — B COMPLEX PO TABS: 1.0000 | ORAL_TABLET | Freq: Every day | ORAL | Status: DC

## 2014-12-08 MED ORDER — TAB-A-VITE/IRON PO TABS
1.0000 | ORAL_TABLET | Freq: Every day | ORAL | Status: DC
  Administered 2014-12-09: 1 via ORAL
  Filled 2014-12-08: qty 1

## 2014-12-08 MED ORDER — SODIUM CHLORIDE 0.9 % IV SOLN: 250.0000 mL | INTRAVENOUS | Status: DC | PRN

## 2014-12-08 MED ORDER — VITAMIN D3 25 MCG (1000 UNIT) PO TABS
1000.0000 [IU] | ORAL_TABLET | Freq: Every day | ORAL | Status: DC
  Administered 2014-12-09: 1000 [IU] via ORAL
  Filled 2014-12-08: qty 1

## 2014-12-08 MED ORDER — ONDANSETRON HCL 4 MG/2ML IJ SOLN: 4.0000 mg | Freq: Four times a day (QID) | INTRAMUSCULAR | Status: DC | PRN

## 2014-12-08 MED ORDER — ACETAMINOPHEN 650 MG RE SUPP: 650.0000 mg | Freq: Four times a day (QID) | RECTAL | Status: DC | PRN

## 2014-12-08 MED ORDER — BENZONATATE 100 MG PO CAPS
100.0000 mg | ORAL_CAPSULE | Freq: Three times a day (TID) | ORAL | Status: DC | PRN
  Administered 2014-12-08 – 2014-12-09 (×2): 100 mg via ORAL
  Filled 2014-12-08 (×2): qty 1

## 2014-12-08 MED ORDER — OXYCODONE HCL 5 MG PO TABS: 5.0000 mg | ORAL_TABLET | ORAL | Status: DC | PRN

## 2014-12-08 MED ORDER — ALUM & MAG HYDROXIDE-SIMETH 200-200-20 MG/5ML PO SUSP: 30.0000 mL | Freq: Four times a day (QID) | ORAL | Status: DC | PRN

## 2014-12-08 MED ORDER — ANASTROZOLE 1 MG PO TABS
1.0000 mg | ORAL_TABLET | Freq: Every day | ORAL | Status: DC
  Administered 2014-12-09: 1 mg via ORAL
  Filled 2014-12-08 (×2): qty 1

## 2014-12-08 MED ORDER — CALCIUM CARBONATE-VITAMIN D 500-200 MG-UNIT PO TABS
1.0000 | ORAL_TABLET | Freq: Two times a day (BID) | ORAL | Status: DC
  Administered 2014-12-08 – 2014-12-09 (×2): 1 via ORAL
  Filled 2014-12-08 (×3): qty 1

## 2014-12-08 MED ORDER — SODIUM CHLORIDE 0.9 % IJ SOLN: 3.0000 mL | INTRAMUSCULAR | Status: DC | PRN

## 2014-12-08 MED ORDER — ONDANSETRON HCL 4 MG PO TABS: 4.0000 mg | ORAL_TABLET | Freq: Four times a day (QID) | ORAL | Status: DC | PRN

## 2014-12-08 MED ORDER — FUROSEMIDE 10 MG/ML IJ SOLN
40.0000 mg | Freq: Once | INTRAMUSCULAR | Status: AC
  Administered 2014-12-08: 40 mg via INTRAVENOUS
  Filled 2014-12-08: qty 4

## 2014-12-08 MED ORDER — HYDROCOD POLST-CPM POLST ER 10-8 MG/5ML PO SUER
5.0000 mL | Freq: Once | ORAL | Status: AC
  Administered 2014-12-08: 5 mL via ORAL
  Filled 2014-12-08: qty 5

## 2014-12-08 NOTE — ED Provider Notes (Signed)
CSN: [id number]     Arrival date & time 12/08/14  1640 History   First MD Initiated Contact with Patient 12/08/14 1653     Chief Complaint  Patient presents with  . Arm Swelling  . Incisional Pain   Chief complaint dyspnea  (Consider location/radiation/quality/duration/timing/severity/associated sxs/prior Treatment) HPI... Patient with metastatic breast cancer presents with dyspnea and left arm swelling. No chest pain, fever, sweats, chills. She was initially at the cancer center and transported to the emergency department. Severity of symptoms is moderate. Nothing makes symptoms better or worse.  Past Medical History  Diagnosis Date  . Cancer 08-13-11    07-31-11-Dx. Bilarteral Breast cancer-left greater than rt.  . Hematuria, undiagnosed cause 08-13-11    Being evaluated by [facility] 08-14-11  . Hypertension   . Bronchitis     hx  . GERD (gastroesophageal reflux disease)     doing well  . 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 Seroma 02/04/12    right breast  200cc removed  erythema on right side  . Seasonal allergies   . History of radiation therapy 02/20/12-04/15/12    left breast,total 60.4 Gy  . Wears partial dentures     wears upper and lower partial  . Allergy   . Blood transfusion without reported diagnosis   . 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   . Cancer of central portion of left female breast 08/01/2011  . S/P radiation therapy 10/09/14-10/27/14    whole brain 37.5Gy/25fx   Past Surgical History  Procedure Laterality Date  . Child birth  08-13-11    x3 -NVD  . Portacath placement  08/15/2011    Procedure: INSERTION PORT-A-CATH;  Surgeon: Harl Bowie, MD;  Location: [facility];  Service: General;  Laterality: N/A;  . Mastectomy w/ sentinel node biopsy  01/21/2012    Procedure: MASTECTOMY WITH SENTINEL LYMPH  NODE BIOPSY;  Surgeon: Harl Bowie, MD;  Location: [facility];  Service: General;  Laterality: Bilateral;  Left modified radical mastectomy, Rt mastectomy with Sentinel lymphnode biospy  . Breast surgery    . Port-a-cath removal Right 12/22/2012    Procedure: REMOVAL PORT-A-CATH;  Surgeon: Harl Bowie, MD;  Location: [facility];  Service: General;  Laterality: Right;   Family History  Problem Relation Age of Onset  . Heart disease Mother   . Cancer Mother 38    breast, , 62 deceased  . Heart attack Father   . Cancer Sister 56    breast  . Colon cancer Neg Hx    History  Substance Use Topics  . Smoking status: Never Smoker   . Smokeless tobacco: Never Used  . Alcohol Use: 0.0 oz/week    0 Standard drinks or equivalent per week     Comment: rare- occ.   OB History    No data available     Review of Systems  All other systems reviewed and are negative.     Allergies  Ace inhibitors  Home Medications   Prior to Admission medications   Medication Sig Start Date End Date Taking? Authorizing Provider  anastrozole (ARIMIDEX) 1 MG tablet TAKE 1 TABLET BY MOUTH DAILY. 10/27/14  Yes Chauncey Cruel, MD  b complex vitamins tablet Take 1 tablet by mouth daily.   Yes Historical Provider, MD  calcium-vitamin  D (OSCAL WITH D) 500-200 MG-UNIT per tablet Take 1 tablet by mouth 2 (two) times daily. 11/08/14  Yes Chauncey Cruel, MD  cholecalciferol (VITAMIN D) 1000 UNITS tablet Take 1 tablet (1,000 Units total) by mouth daily. 11/08/14  Yes Chauncey Cruel, MD  lidocaine-prilocaine (EMLA) cream Apply 1 application topically as needed. Patient taking differently: Apply 1 application topically as needed (pain, irritation).  10/17/14  Yes Laurie Panda, NP  Multiple Vitamins-Iron (MULTIVITAMINS WITH IRON) TABS Take 1 tablet by mouth daily.   Yes Historical Provider, MD  tobramycin-dexamethasone [facility]) ophthalmic solution Place 1 drop into  the left eye every 4 (four) hours while awake.   Yes Historical Provider, MD   BP 129/67 mmHg  Pulse 92  Temp(Src) 97.8 F (36.6 C) (Oral)  Resp 20  SpO2 97% Physical Exam  Constitutional: She is oriented to person, place, and time.  Patient has alopecia  HENT:  Head: Normocephalic and atraumatic.  Eyes: Conjunctivae and EOM are normal. Pupils are equal, round, and reactive to light.  Neck: Normal range of motion. Neck supple.  Cardiovascular: Normal rate and regular rhythm.   Pulmonary/Chest: Effort normal.  Decreased breath sounds on left  Abdominal: Soft. Bowel sounds are normal.  Musculoskeletal:  Left arm edematous  Neurological: She is alert and oriented to person, place, and time.  Skin: Skin is warm and dry.  Psychiatric: She has a normal mood and affect. Her behavior is normal.  Nursing note and vitals reviewed.   ED Course  Procedures (including critical care time) Labs Review Labs Reviewed  CBC WITH DIFFERENTIAL/PLATELET - Abnormal; Notable for the following:    RBC 3.55 (*)    Hemoglobin 10.3 (*)    HCT 33.5 (*)    RDW 16.7 (*)    All other components within normal limits  COMPREHENSIVE METABOLIC PANEL - Abnormal; Notable for the following:    Glucose, Bld 119 (*)    Calcium 8.6 (*)    All other components within normal limits  BRAIN NATRIURETIC PEPTIDE  TROPONIN I    Imaging Review Dg Chest Port 1 View  12/08/2014   CLINICAL DATA:  Productive cough x1 month, history of pneumonia and breast cancer  EXAM: PORTABLE CHEST - 1 VIEW  COMPARISON:  11/29/2014  FINDINGS: Moderate to large left pleural effusion, increased.  Increased interstitial markings in the left upper lobe with associated subpleural scarring, grossly unchanged from prior CT.  Right lung is essentially clear.  Cardiomegaly.  Right chest port terminates at the cavoatrial junction.  IMPRESSION: Moderate to large left pleural effusion, increased.  Chronic changes in the left upper lobe, unchanged  from prior CT.   Electronically Signed   By: Julian Hy M.D.   On: 12/08/2014 18:03     EKG Interpretation None      MDM   Final diagnoses:  Left upper extremity swelling  Pleural effusion, left    Chest x-ray reveals a moderate to large left-sided pleural effusion. Ultrasound of left upper extremity showed no DVT. Admit to general medicine.    Nat Christen, MD 12/08/14 2024

## 2014-12-08 NOTE — Telephone Encounter (Signed)
Daughter in law Wells Guiles called.  More persistant cough, not going away. Only goes away with codeine, it puts her to sleep and when she wakes up she starts coughing again. Non productive. Is DOE. She also has eye pain and drainage. New eye drop 1 drop q4hrs. (pt is using 2 drops on her own volition). Feels bloated, constipation corrected with prune juice. L arm and hand swelling within the last week. [facility] set up for today, Rebeka asks to be contacted if needed.

## 2014-12-08 NOTE — ED Notes (Signed)
X-ray at bedside

## 2014-12-08 NOTE — ED Notes (Signed)
1st Attempt to call report to 3W RN; Anderson Malta. Secretary stated she will call back.

## 2014-12-08 NOTE — ED Notes (Addendum)
Pt from [facility] with RN c/o L arm edema, bilateral lower extremity edema and L sided mastectomy incisional pain from sx in 2013. Pt adds that she has SOB upon ambulation. Pt is actively receiving chemo for breast CA. Pt sent for eval. Pt is A&O and in NAD.

## 2014-12-08 NOTE — Telephone Encounter (Signed)
rebeka called stating pt has different symptoms with increasing coughing and difficulty breathing. She has swelling of L hand and arm. Pt states is 2x>R arm.

## 2014-12-08 NOTE — ED Notes (Signed)
Ultrasound at bedside

## 2014-12-08 NOTE — Progress Notes (Signed)
VASCULAR LAB PRELIMINARY  PRELIMINARY  PRELIMINARY  PRELIMINARY  Left upper extremity venous duplex completed.    Preliminary report:  Left:  No evidence of DVT or superficial thrombosis.    Malarie Tappen, RVT 12/08/2014, 6:13 PM

## 2014-12-08 NOTE — ED Notes (Signed)
MD at bedside. 

## 2014-12-08 NOTE — H&P (Signed)
[facility] Admission History and Physical       JAILYNE CHIEFFO MGQ:[id number] DOB: 09/24/1945 DOA: 12/08/2014  Referring physician: EDP PCP: Leeanne Rio, PA-C  Specialists:   Chief Complaint: SOB and Left ARM Swelling  HPI: MILISA KIMBELL is a 69 y.o. female with a history of Metastatic Breast Cancer Bilateral Breast dx 07/2011) S/P Rt Mastectomy and Left  Modified Radical Mastectomy S/P Radiation Rx, and  Last Chemo Rx in 09/2014 who presents to the ED with complaints of increased SOB and cough as well as increased Swelling of the Left Arm x 2-3 days.   She denies any fevers or chills, she does report a dry cough.   She was evaluated in the ED and was found to have a large Left sided Pleural Effusion, and a Venous Duplex US was performed of the Left Arm and was negative for a DVT.  She was referred for admission.      Review of Systems: Constitutional: No Weight Loss, No Weight Gain, Night Sweats, Fevers, Chills, Dizziness, Light Headedness, Fatigue, or Generalized Weakness HEENT: No Headaches, Difficulty Swallowing,Tooth/Dental Problems,Sore Throat,  No Sneezing, Rhinitis, Ear Ache, Nasal Congestion, or Post Nasal Drip,  Cardio-vascular:  No Chest pain, Orthopnea, PND, +Edema of Left ARM and in  Both Lower Extremities, Anasarca, Dizziness, Palpitations  Resp: +Dyspnea, No DOE, No Productive Cough, No Non-Productive Cough, No Hemoptysis, No Wheezing.    GI: No Heartburn, Indigestion, Abdominal Pain, Nausea, Vomiting, Diarrhea, Constipation, Hematemesis, Hematochezia, Melena, Change in Bowel Habits,  Loss of Appetite  GU: No Dysuria, No Change in Color of Urine, No Urgency or Urinary Frequency, No Flank pain.  Musculoskeletal: No Joint Pain or Swelling, No Decreased Range of Motion, No Back Pain.  Neurologic: No Syncope, No Seizures, Muscle Weakness, Paresthesia, Vision Disturbance or Loss, No Diplopia, No Vertigo, No Difficulty Walking,  Skin: No Rash or  Lesions. Psych: No Change in Mood or Affect, No Depression or Anxiety, No Memory loss, No Confusion, or Hallucinations   Past Medical History  Diagnosis Date  . Cancer 08-13-11    07-31-11-Dx. Bilarteral Breast cancer-left greater than rt.  . Hematuria, undiagnosed cause 08-13-11    Being evaluated by [facility] 08-14-11  . Hypertension   . Bronchitis     hx  . GERD (gastroesophageal reflux disease)     doing well  . 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 Seroma 02/04/12    right breast  200cc removed  erythema on right side  . Seasonal allergies   . History of radiation therapy 02/20/12-04/15/12    left breast,total 60.4 Gy  . Wears partial dentures     wears upper and lower partial  . Allergy   . Blood transfusion without reported diagnosis   . 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   . Cancer of central portion of left female breast 08/01/2011  . S/P radiation therapy 10/09/14-10/27/14    whole brain 37.5Gy/58fx     Past Surgical History  Procedure Laterality Date  . Child birth  08-13-11    x3 -NVD  . Portacath placement  08/15/2011    Procedure: INSERTION PORT-A-CATH;  Surgeon: Harl Bowie, MD;  Location: [facility];  Service: General;  Laterality: N/A;  . Mastectomy w/ sentinel node biopsy  01/21/2012    Procedure: MASTECTOMY WITH SENTINEL LYMPH NODE  BIOPSY;  Surgeon: Harl Bowie, MD;  Location: [place] [facility];  Service: General;  Laterality: Bilateral;  Left modified radical mastectomy, Rt mastectomy with Sentinel lymphnode biospy  . Breast surgery    . Port-a-cath removal Right 12/22/2012    Procedure: REMOVAL PORT-A-CATH;  Surgeon: Harl Bowie, MD;  Location: [facility];  Service: General;  Laterality: Right;      Prior to Admission medications   Medication Sig Start Date End  Date Taking? Authorizing Provider  anastrozole (ARIMIDEX) 1 MG tablet TAKE 1 TABLET BY MOUTH DAILY. 10/27/14  Yes Chauncey Cruel, MD  b complex vitamins tablet Take 1 tablet by mouth daily.   Yes Historical Provider, MD  calcium-vitamin D (OSCAL WITH D) 500-200 MG-UNIT per tablet Take 1 tablet by mouth 2 (two) times daily. 11/08/14  Yes Chauncey Cruel, MD  cholecalciferol (VITAMIN D) 1000 UNITS tablet Take 1 tablet (1,000 Units total) by mouth daily. 11/08/14  Yes Chauncey Cruel, MD  lidocaine-prilocaine (EMLA) cream Apply 1 application topically as needed. Patient taking differently: Apply 1 application topically as needed (pain, irritation).  10/17/14  Yes Laurie Panda, NP  Multiple Vitamins-Iron (MULTIVITAMINS WITH IRON) TABS Take 1 tablet by mouth daily.   Yes Historical Provider, MD  tobramycin-dexamethasone [facility]) ophthalmic solution Place 1 drop into the left eye every 4 (four) hours while awake.   Yes Historical Provider, MD     Allergies  Allergen Reactions  . Ace Inhibitors Cough    Social History:  reports that she has never smoked. She has never used smokeless tobacco. She reports that she drinks alcohol. She reports that she does not use illicit drugs.    Family History  Problem Relation Age of Onset  . Heart disease Mother   . Cancer Mother 70    breast, , 7 deceased  . Heart attack Father   . Cancer Sister 18    breast  . Colon cancer Neg Hx        Physical Exam:  GEN:  Pleasant  69 y.o. female examined and in no acute distress; cooperative with exam Filed Vitals:   12/08/14 1645 12/08/14 1745 12/08/14 1825 12/08/14 2008  BP: 127/71 113/65 123/71 129/67  Pulse: 94 96 92 92  Temp: 97.8 F (36.6 C)     TempSrc: Oral     Resp: 16 22 22 20   SpO2: 97% 98% 98% 97%   Blood pressure 129/67, pulse 92, temperature 97.8 F (36.6 C), temperature source Oral, resp. rate 20, SpO2 97 %. PSYCH: She is alert and oriented x4; does not appear anxious does not  appear depressed; affect is normal HEENT: Normocephalic and Atraumatic, Mucous membranes pink; PERRLA; EOM intact; Fundi:  Benign;  No scleral icterus, Nares: Patent, Oropharynx: Clear, Edentulous or Fair Dentition,    Neck:  FROM, No Cervical Lymphadenopathy nor Thyromegaly or Carotid Bruit; No JVD; Breasts:: Not examined CHEST WALL: No tenderness CHEST: Normal respiration, clear to auscultation bilaterally HEART: Regular rate and rhythm; no murmurs rubs or gallops BACK: No kyphosis or scoliosis; No CVA tenderness ABDOMEN: Positive Bowel Sounds, Obese, Soft Non-Tender, No Rebound or Guarding; No Masses, No Organomegaly.    Rectal Exam: Not done EXTREMITIES: No Cyanosis, Clubbing, 3+ EDEMA of BLEs, and 4+EDEMA of the LUE, No Ulcerations. Genitalia: not examined PULSES: 2+ and symmetric SKIN: Normal hydration no rash or ulceration CNS:  Alert and Oriented x 4, No Focal Deficits Vascular: pulses palpable throughout    Labs on  Admission:  Basic Metabolic Panel:  Recent Labs Lab 12/08/14 1438 12/08/14 1758  NA 136 136  K 4.0 3.9  CL  --  103  CO2 23 25  GLUCOSE 122 119*  BUN 16.0 17  CREATININE 0.8 0.76  CALCIUM 8.7 8.6*   Liver Function Tests:  Recent Labs Lab 12/08/14 1438 12/08/14 1758  AST 22 24  ALT 32 33  ALKPHOS 84 80  BILITOT 0.47 0.8  PROT 6.6 7.0  ALBUMIN 3.4* 3.8   No results for input(s): LIPASE, AMYLASE in the last 168 hours. No results for input(s): AMMONIA in the last 168 hours. CBC:  Recent Labs Lab 12/08/14 1438 12/08/14 1758  WBC 6.7 7.1  NEUTROABS 4.6 4.9  HGB 10.7* 10.3*  HCT 33.3* 33.5*  MCV 94.9 94.4  PLT 238 257   Cardiac Enzymes:  Recent Labs Lab 12/08/14 1758  TROPONINI <0.03    BNP (last 3 results)  Recent Labs  09/10/14 1740 12/08/14 1758  BNP 80.9 29.3    ProBNP (last 3 results) No results for input(s): PROBNP in the last 8760 hours.  CBG: No results for input(s): GLUCAP in the last 168 hours.  Radiological  Exams on Admission: Dg Chest Port 1 View  12/08/2014   CLINICAL DATA:  Productive cough x1 month, history of pneumonia and breast cancer  EXAM: PORTABLE CHEST - 1 VIEW  COMPARISON:  11/29/2014  FINDINGS: Moderate to large left pleural effusion, increased.  Increased interstitial markings in the left upper lobe with associated subpleural scarring, grossly unchanged from prior CT.  Right lung is essentially clear.  Cardiomegaly.  Right chest port terminates at the cavoatrial junction.  IMPRESSION: Moderate to large left pleural effusion, increased.  Chronic changes in the left upper lobe, unchanged from prior CT.   Electronically Signed   By: Julian Hy M.D.   On: 12/08/2014 18:03     EKG: Independently reviewed. Normal sinus Rhythm rate =93 Diffuse Artifact present and Low Voltage     Assessment/Plan:      69 y.o. female with  Principal Problem:   1.     Pleural effusion, left   IR for Thoracentesis   NPO after Midnight for thoracentesis in AM   O2 PRN     Active Problems:   2.    Anasarca   IV Lasix PRN     3.    SOB (shortness of breath) due to #1   NCO2 PRN     4.    Hypertension   Monitor O2      5.    Bilateral breast cancer- Oncologist is Dr Jana Hakim   Notify Oncology      6.    DVT Prophylaxis   SCDs     Code Status:     FULL CODE        Family Communication:   Family at Bedside       Disposition Plan:    Inpatient Status        Time spent:  [facility] Pager [phone]   If [place] Please Contact the Day Rounding Team MD for [facility]  If 7PM-7AM, Please Contact Night-Floor Coverage  [email or web address] Password [email or web address] 12/08/2014, 9:30 PM     ADDENDUM:   Patient was seen and examined on 12/08/2014

## 2014-12-08 NOTE — ED Notes (Signed)
Pt adds that she has dry, non-productive cough, SHOB with exertion and while talking on the phone.

## 2014-12-08 NOTE — ED Notes (Signed)
Bed: RESB Expected date:  Expected time:  Means of arrival:  Comments: Ca pt/SOB/swelling to L arm and feet

## 2014-12-09 ENCOUNTER — Inpatient Hospital Stay (HOSPITAL_COMMUNITY): Payer: Medicare Other

## 2014-12-09 DIAGNOSIS — J948 Other specified pleural conditions: Secondary | ICD-10-CM

## 2014-12-09 LAB — BASIC METABOLIC PANEL
Anion gap: 8 (ref 5–15)
BUN: 19 mg/dL (ref 6–20)
CALCIUM: 8.9 mg/dL (ref 8.9–10.3)
CHLORIDE: 101 mmol/L (ref 101–111)
CO2: 27 mmol/L (ref 22–32)
Creatinine, Ser: 0.99 mg/dL (ref 0.44–1.00)
GFR calc Af Amer: >60 mL/min (ref >60)
GFR, EST NON AFRICAN AMERICAN: 57 mL/min — AB (ref >60)
GLUCOSE: 118 mg/dL — AB (ref 65–99)
Potassium: 4 mmol/L (ref 3.5–5.1)
Sodium: 136 mmol/L (ref 135–145)

## 2014-12-09 LAB — CBC
HEMATOCRIT: 33.9 % — AB (ref 36.0–46.0)
Hemoglobin: 10.6 g/dL — ABNORMAL LOW (ref 12.0–15.0)
MCH: 30.2 pg (ref 26.0–34.0)
MCHC: 31.3 g/dL (ref 30.0–36.0)
MCV: 96.6 fL (ref 78.0–100.0)
Platelets: 261 10*3/uL (ref 150–400)
RBC: 3.51 MIL/uL — ABNORMAL LOW (ref 3.87–5.11)
RDW: 17.1 % — ABNORMAL HIGH (ref 11.5–15.5)
WBC: 7.5 10*3/uL (ref 4.0–10.5)

## 2014-12-09 LAB — BODY FLUID CELL COUNT WITH DIFFERENTIAL
Eos, Fluid: 0 %
LYMPHS FL: 75 %
Monocyte-Macrophage-Serous Fluid: 15 % — ABNORMAL LOW (ref 50–90)
Neutrophil Count, Fluid: 10 % (ref 0–25)
Total Nucleated Cell Count, Fluid: 578 cu mm (ref 0–1000)

## 2014-12-09 LAB — LACTATE DEHYDROGENASE, PLEURAL OR PERITONEAL FLUID: LD, Fluid: 58 U/L — ABNORMAL HIGH (ref 3–23)

## 2014-12-09 LAB — PROTEIN, BODY FLUID

## 2014-12-09 LAB — PROTEIN, TOTAL: Total Protein: 7 g/dL (ref 6.5–8.1)

## 2014-12-09 LAB — LACTATE DEHYDROGENASE: LDH: 117 U/L (ref 98–192)

## 2014-12-09 MED ORDER — BENZONATATE 100 MG PO CAPS: 100.0000 mg | ORAL_CAPSULE | Freq: Three times a day (TID) | ORAL | Status: DC | PRN

## 2014-12-09 MED ORDER — FUROSEMIDE 40 MG PO TABS: 40.0000 mg | ORAL_TABLET | Freq: Every day | ORAL | Status: DC

## 2014-12-09 MED ORDER — HYDROCODONE-HOMATROPINE 5-1.5 MG/5ML PO SYRP
5.0000 mL | ORAL_SOLUTION | Freq: Four times a day (QID) | ORAL | Status: DC | PRN
  Administered 2014-12-09 (×2): 5 mL via ORAL
  Filled 2014-12-09 (×2): qty 5

## 2014-12-09 MED ORDER — HEPARIN SOD (PORK) LOCK FLUSH 100 UNIT/ML IV SOLN
500.0000 [IU] | Freq: Once | INTRAVENOUS | Status: DC
  Filled 2014-12-09: qty 5

## 2014-12-09 MED ORDER — HYDROCODONE-HOMATROPINE 5-1.5 MG/5ML PO SYRP: 5.0000 mL | ORAL_SOLUTION | Freq: Four times a day (QID) | ORAL | Status: DC | PRN

## 2014-12-09 NOTE — Progress Notes (Signed)
Patient admitted to room 1327 from ED with edema and pleural effusion/SOB. Oriented to room and unit. Family at bedside. Patient denies pain.

## 2014-12-09 NOTE — Discharge Summary (Addendum)
Linda [name], is a 69 y.o. female  DOB [birth year]  MRN [id number].  Admission date:  12/08/2014  Admitting Physician  Theressa Millard, MD  Discharge Date:  12/09/2014   Primary MD  Leeanne Rio, PA-C  Recommendations for primary care physician for things to follow:  - please check CBC, BMP during next visit. - please check 2 view chest xray during next visit, to follow on pleural effusion.   Admission Diagnosis  Pleural effusion on left [J94.8] Pleural effusion, left [J94.8] Left upper extremity swelling [M79.89]   Discharge Diagnosis  Pleural effusion on left [J94.8] Pleural effusion, left [J94.8] Left upper extremity swelling [M79.89]    Principal Problem:   Pleural effusion, left Active Problems:   Hypertension   Bilateral breast cancer   Anasarca   SOB (shortness of breath)   Left upper extremity swelling   Pleural effusion on left      Past Medical History  Diagnosis Date  . Cancer 08-13-11    07-31-11-Dx. Bilarteral Breast cancer-left greater than rt.  . Hematuria, undiagnosed cause 08-13-11    Being evaluated by [facility] 08-14-11  . Hypertension   . Bronchitis     hx  . GERD (gastroesophageal reflux disease)     doing well  . 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 Seroma 02/04/12    right breast  200cc removed  erythema on right side  . Seasonal allergies   . History of radiation therapy 02/20/12-04/15/12    left breast,total 60.4 Gy  . Wears partial dentures     wears upper and lower partial  . Allergy   . Blood transfusion without reported diagnosis   . 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   . Cancer of central portion of left female breast 08/01/2011  . S/P radiation therapy 10/09/14-10/27/14   whole brain 37.5Gy/61fx    Past Surgical History  Procedure Laterality Date  . Child birth  08-13-11    x3 -NVD  . Portacath placement  08/15/2011    Procedure: INSERTION PORT-A-CATH;  Surgeon: Harl Bowie, MD;  Location: [facility];  Service: General;  Laterality: N/A;  . Mastectomy w/ sentinel node biopsy  01/21/2012    Procedure: MASTECTOMY WITH SENTINEL LYMPH NODE BIOPSY;  Surgeon: Harl Bowie, MD;  Location: [facility];  Service: General;  Laterality: Bilateral;  Left modified radical mastectomy, Rt mastectomy with Sentinel lymphnode biospy  . Breast surgery    . Port-a-cath removal Right 12/22/2012    Procedure: REMOVAL PORT-A-CATH;  Surgeon: Harl Bowie, MD;  Location: [facility];  Service: General;  Laterality: Right;       History of present illness and  Hospital Course:     Kindly see H&P for history of present illness and admission details, please review complete Labs, Consult reports and Test reports for all  details in brief  HPI  from the history and physical done on the day of admission  HPI: Linda [name] is a 69 y.o. female with a history of Metastatic Breast Cancer Bilateral Breast dx 07/2011) S/P Rt Mastectomy and Left Modified Radical Mastectomy S/P Radiation Rx, and Last Chemo Rx in 09/2014 who presents to the ED with complaints of increased SOB and cough as well as increased Swelling of the Left Arm x 2-3 days. She denies any fevers or chills, she does report a dry cough. She was evaluated in the ED and was found to have a large Left sided Pleural Effusion, and a Venous Duplex US was performed of the Left Arm and was negative for a DVT. She was referred for admission.  Hospital Course   Left Pleural effusion: - Most likely malignant pleural effusion, as patient with known history of malignant pleural effusion in the past most recent train for right pleural effusion and March 2016, status post thoracentesis by IR,  with 750 mL drained, shortness of breath has resolved.  Left upper extremity swelling - This is most likely lymphedema related to her mastectomy surgery, venous Doppler negative for DVT.  Chronic diastolic CHF - will start on lasix 40 mh oral daily  Discharge Condition:  stable   Follow UP  Follow-up Information    Follow up with Leeanne Rio, PA-C. Schedule an appointment as soon as possible for a visit in 1 week.   Specialty:  Physician Assistant   Why:  post hospitalization follow up.   Contact information:   [place] [address] [phone]         Discharge Instructions  and  Discharge Medications         Discharge Instructions    Discharge instructions    Complete by:  As directed   Follow with Primary MD Leeanne Rio, PA-C in 7 days   Get CBC, CMP, 2 view Chest X ray checked  by Primary MD next visit.    Activity: As tolerated with Full fall precautions use walker/cane & assistance as needed   Disposition Home **   Diet: regular , with feeding assistance and aspiration precautions.  For Heart failure patients - Check your Weight same time everyday, if you gain over 2 pounds, or you develop in leg swelling, experience more shortness of breath or chest pain, call your Primary MD immediately. Follow Cardiac Low Salt Diet and 1.5 lit/day fluid restriction.   On your next visit with your primary care physician please Get Medicines reviewed and adjusted.   Please request your Prim.MD to go over all Hospital Tests and Procedure/Radiological results at the follow up, please get all Hospital records sent to your Prim MD by signing hospital release before you go home.   If you experience worsening of your admission symptoms, develop shortness of breath, life threatening emergency, suicidal or homicidal thoughts you must seek medical attention immediately by calling 911 or calling your MD immediately  if symptoms less  severe.  You Must read complete instructions/literature along with all the possible adverse reactions/side effects for all the Medicines you take and that have been prescribed to you. Take any new Medicines after you have completely understood and accpet all the possible adverse reactions/side effects.   Do not drive, operating heavy machinery, perform activities at heights, swimming or participation in water activities or provide baby sitting services if your were admitted for syncope or siezures until you  have seen by Primary MD or a Neurologist and advised to do so again.  Do not drive when taking Pain medications.    Do not take more than prescribed Pain, Sleep and Anxiety Medications  Special Instructions: If you have smoked or chewed Tobacco  in the last 2 yrs please stop smoking, stop any regular Alcohol  and or any Recreational drug use.  Wear Seat belts while driving.   Please note  You were cared for by a hospitalist during your hospital stay. If you have any questions about your discharge medications or the care you received while you were in the hospital after you are discharged, you can call the unit and asked to speak with the hospitalist on call if the hospitalist that took care of you is not available. Once you are discharged, your primary care physician will handle any further medical issues. Please note that NO REFILLS for any discharge medications will be authorized once you are discharged, as it is imperative that you return to your primary care physician (or establish a relationship with a primary care physician if you do not have one) for your aftercare needs so that they can reassess your need for medications and monitor your lab values.     Increase activity slowly    Complete by:  As directed             Medication List    TAKE these medications        anastrozole 1 MG tablet  Commonly known as:  ARIMIDEX  TAKE 1 TABLET BY MOUTH DAILY.     b complex vitamins  tablet  Take 1 tablet by mouth daily.     benzonatate 100 MG capsule  Commonly known as:  TESSALON  Take 1 capsule (100 mg total) by mouth 3 (three) times daily as needed for cough.     calcium-vitamin D 500-200 MG-UNIT per tablet  Commonly known as:  OSCAL WITH D  Take 1 tablet by mouth 2 (two) times daily.     cholecalciferol 1000 UNITS tablet  Commonly known as:  VITAMIN D  Take 1 tablet (1,000 Units total) by mouth daily.     furosemide 40 MG tablet  Commonly known as:  LASIX  Take 1 tablet (40 mg total) by mouth daily.     HYDROcodone-homatropine 5-1.5 MG/5ML syrup  Commonly known as:  HYCODAN  Take 5 mLs by mouth every 6 (six) hours as needed for cough.     lidocaine-prilocaine cream  Commonly known as:  EMLA  Apply 1 application topically as needed.     multivitamins with iron Tabs tablet  Take 1 tablet by mouth daily.     tobramycin-dexamethasone ophthalmic solution  Commonly known as:  TOBRADEX  Place 1 drop into the left eye every 4 (four) hours while awake.          Diet and Activity recommendation: See Discharge Instructions above   Consults obtained -  none   Major procedures and Radiology Reports - PLEASE review detailed and final reports for all details, in brief  - thoracentesis of left pleural effusion by IR   Dg Chest 1 View  12/09/2014   CLINICAL DATA:  Pleural effusion.  Post left-sided thoracentesis.  EXAM: CHEST  1 VIEW  COMPARISON:  Chest radiograph- 12/08/2014  FINDINGS: Interval reduction in persistent small left-sided effusion post thoracentesis. No pneumothorax.  Grossly unchanged enlarged cardiac silhouette and mediastinal contours given persistently reduced lung volumes. Stable position of support apparatus.  Improved aeration of the left lung base with persistent bibasilar opacities, left greater than right. No new focal airspace opacities.  Surgical clips overlie the left axilla. Osseous structures appear normal.  IMPRESSION: 1.  Interval reduction in persistent small left-sided effusion post thoracentesis. No pneumothorax. 2. Improved aeration the left lung base with residual bibasilar opacities, left greater than right, likely atelectasis.   Electronically Signed   By: Sandi Mariscal M.D.   On: 12/09/2014 11:39   Dg Chest 2 View  11/29/2014   CLINICAL DATA:  Breast cancer  EXAM: CHEST  2 VIEW  COMPARISON:  09/11/2014  FINDINGS: Small left pleural effusion shows mild interval improvement. Mild left lower lobe atelectasis also improved. Minimal pleural effusion on the right.  Cardiac enlargement. Pericardial effusion noted on prior CT. Negative for heart failure or edema. Port-A-Cath tip in the lower SVC.  IMPRESSION: Mild improvement in left pleural effusion and left lower lobe atelectasis. Minimal right pleural effusion.  Cardiac enlargement consistent with pericardial effusion.   Electronically Signed   By: Franchot Gallo M.D.   On: 11/29/2014 15:58   Dg Chest Port 1 View  12/08/2014   CLINICAL DATA:  Productive cough x1 month, history of pneumonia and breast cancer  EXAM: PORTABLE CHEST - 1 VIEW  COMPARISON:  11/29/2014  FINDINGS: Moderate to large left pleural effusion, increased.  Increased interstitial markings in the left upper lobe with associated subpleural scarring, grossly unchanged from prior CT.  Right lung is essentially clear.  Cardiomegaly.  Right chest port terminates at the cavoatrial junction.  IMPRESSION: Moderate to large left pleural effusion, increased.  Chronic changes in the left upper lobe, unchanged from prior CT.   Electronically Signed   By: Julian Hy M.D.   On: 12/08/2014 18:03   US Thoracentesis Asp Pleural Space W/img Guide  12/09/2014   INDICATION: Symptomatic left-sided sided pleural effusion  EXAM: US THORACENTESIS ASP PLEURAL SPACE W/IMG GUIDE  COMPARISON:  None.  MEDICATIONS: 10 cc 1% lidocaine  COMPLICATIONS: None immediate  TECHNIQUE: Informed written consent was obtained from the patient  after a discussion of the risks, benefits and alternatives to treatment. A timeout was performed prior to the initiation of the procedure.  Initial ultrasound scanning demonstrates a left pleural effusion. The lower chest was prepped and draped in the usual sterile fashion. 1% lidocaine was used for local anesthesia. Under direct ultrasound guidance, a 19 gauge, 7-cm, Yueh catheter was introduced. An ultrasound image was saved for documentation purposes. The thoracentesis was performed. The catheter was removed and a dressing was applied. The patient tolerated the procedure well without immediate post procedural complication. The patient was escorted to have an upright chest radiograph.  FINDINGS: A total of approximately 750 cc of yellow fluid was removed. Requested samples were sent to the laboratory.  IMPRESSION: Successful ultrasound-guided L sided thoracentesis yielding 750 cc of pleural fluid.  Read by:  Lavonia Drafts [facility]   Electronically Signed   By: Sandi Mariscal M.D.   On: 12/09/2014 11:30    Micro Results     No results found for this or any previous visit (from the past 240 hour(s)).     Today   Subjective:   Linda [name] today has no headache,no chest abdominal pain,no new weakness tingling or numbness, feels much better wants to go home today. Complaints of cough.  Objective:   Blood pressure 107/78, pulse 89, temperature 98.1 F (36.7 C), temperature source Oral, resp. rate 18, height 5\' 9"  (1.753  m), weight 119.296 kg (263 lb), SpO2 96 %.   Intake/Output Summary (Last 24 hours) at 12/09/14 1534 Last data filed at 12/09/14 1500  Gross per 24 hour  Intake    510 ml  Output    400 ml  Net    110 ml    Exam Awake Alert, Oriented x 3, No new F.N deficits, Normal affect [place].AT,PERRAL Supple Neck,No JVD, No cervical lymphadenopathy appriciated.  Symmetrical Chest wall movement, diminished air movement in left lung, RRR,No Gallops,Rubs or new Murmurs, No Parasternal  Heave +ve B.Sounds, Abd Soft, Non tender, No organomegaly appriciated, No rebound -guarding or rigidity. No Cyanosis, Clubbing , +1 edema B/L lower ext , No new Rash or bruise, left arm lymphedema  Data Review   CBC w Diff:  Lab Results  Component Value Date   WBC 7.5 12/09/2014   WBC 6.7 12/08/2014   WBC 11.6* 09/10/2014   HGB 10.6* 12/09/2014   HGB 10.7* 12/08/2014   HGB 11.0* 09/10/2014   HCT 33.9* 12/09/2014   HCT 33.3* 12/08/2014   HCT 35.1* 09/10/2014   PLT 261 12/09/2014   PLT 238 12/08/2014   LYMPHOPCT 20 12/08/2014   LYMPHOPCT 17.9 12/08/2014   MONOPCT 9 12/08/2014   MONOPCT 11.2 12/08/2014   EOSPCT 2 12/08/2014   EOSPCT 1.3 12/08/2014   BASOPCT 0 12/08/2014   BASOPCT 0.3 12/08/2014    CMP:  Lab Results  Component Value Date   NA 136 12/09/2014   NA 136 12/08/2014   K 4.0 12/09/2014   K 4.0 12/08/2014   CL 101 12/09/2014   CL 107 11/01/2012   CO2 27 12/09/2014   CO2 23 12/08/2014   BUN 19 12/09/2014   BUN 16.0 12/08/2014   CREATININE 0.99 12/09/2014   CREATININE 0.8 12/08/2014   CREATININE 0.61 06/30/2013   PROT 7.0 12/09/2014   PROT 6.6 12/08/2014   ALBUMIN 3.8 12/08/2014   ALBUMIN 3.4* 12/08/2014   BILITOT 0.8 12/08/2014   BILITOT 0.47 12/08/2014   ALKPHOS 80 12/08/2014   ALKPHOS 84 12/08/2014   AST 24 12/08/2014   AST 22 12/08/2014   ALT 33 12/08/2014   ALT 32 12/08/2014  .   Total Time in preparing paper work, data evaluation and todays exam - 35 minutes  ELGERGAWY, DAWOOD M.D on 12/09/2014 at 3:34 PM  [facility]   Office  [phone]

## 2014-12-09 NOTE — Discharge Instructions (Signed)
Follow with Primary MD Leeanne Rio, PA-C in 7 days   Get CBC, CMP, 2 view Chest X ray checked  by Primary MD next visit.    Activity: As tolerated with Full fall precautions use walker/cane & assistance as needed   Disposition Home    Diet: regular , with feeding assistance and aspiration precautions.  For Heart failure patients - Check your Weight same time everyday, if you gain over 2 pounds, or you develop in leg swelling, experience more shortness of breath or chest pain, call your Primary MD immediately. Follow Cardiac Low Salt Diet and 1.5 lit/day fluid restriction.   On your next visit with your primary care physician please Get Medicines reviewed and adjusted.   Please request your Prim.MD to go over all Hospital Tests and Procedure/Radiological results at the follow up, please get all Hospital records sent to your Prim MD by signing hospital release before you go home.   If you experience worsening of your admission symptoms, develop shortness of breath, life threatening emergency, suicidal or homicidal thoughts you must seek medical attention immediately by calling 911 or calling your MD immediately  if symptoms less severe.  You Must read complete instructions/literature along with all the possible adverse reactions/side effects for all the Medicines you take and that have been prescribed to you. Take any new Medicines after you have completely understood and accpet all the possible adverse reactions/side effects.   Do not drive, operating heavy machinery, perform activities at heights, swimming or participation in water activities or provide baby sitting services if your were admitted for syncope or siezures until you have seen by Primary MD or a Neurologist and advised to do so again.  Do not drive when taking Pain medications.    Do not take more than prescribed Pain, Sleep and Anxiety Medications  Special Instructions: If you have smoked or chewed Tobacco  in  the last 2 yrs please stop smoking, stop any regular Alcohol  and or any Recreational drug use.  Wear Seat belts while driving.   Please note  You were cared for by a hospitalist during your hospital stay. If you have any questions about your discharge medications or the care you received while you were in the hospital after you are discharged, you can call the unit and asked to speak with the hospitalist on call if the hospitalist that took care of you is not available. Once you are discharged, your primary care physician will handle any further medical issues. Please note that NO REFILLS for any discharge medications will be authorized once you are discharged, as it is imperative that you return to your primary care physician (or establish a relationship with a primary care physician if you do not have one) for your aftercare needs so that they can reassess your need for medications and monitor your lab values.

## 2014-12-09 NOTE — Procedures (Signed)
  US guided L thora  750 cc yellow fluid Sent for labs  Pt tolerated well  cxr pending

## 2014-12-09 NOTE — Progress Notes (Signed)
Called ED to get report on patient. 

## 2014-12-11 ENCOUNTER — Telehealth: Payer: Self-pay | Admitting: *Deleted

## 2014-12-11 ENCOUNTER — Encounter: Payer: Self-pay | Admitting: Nurse Practitioner

## 2014-12-11 DIAGNOSIS — R609 Edema, unspecified: Secondary | ICD-10-CM | POA: Insufficient documentation

## 2014-12-11 DIAGNOSIS — R34 Anuria and oliguria: Secondary | ICD-10-CM | POA: Insufficient documentation

## 2014-12-11 LAB — PATHOLOGIST SMEAR REVIEW

## 2014-12-11 NOTE — Assessment & Plan Note (Signed)
(  1) status post bilateral breast biopsies 07/24/2011, showing,    (a) on the right, a clinical T2 N0 papillary/ductal breast cancer, estrogen and progesterone receptor positive, HER-2 negative, with an MIB-1 of 10%;   (b) on the left, a clinical T3 N1, stage IIIA invasive ductal carcinoma, grade 3, triple positive, with an MIB-1 of 60%.  (2) Status post 4 dose dense cycles of doxorubicin/ cyclophosphamide followed by 4 dose dense cycles of paclitaxel and trastuzumab completed 12/09/2011  (3) the trastuzumab was continued for a total of one year (to 11/01/2012). Final echo on 11/04/2012 showing a well preserved ejection fraction of 55-60%.  (4) s/p bilateral mastectomies 01/21/2012 showing  (a) on the Right, an 8 mm invasive papillary carcinoma, grade 1, ypT1b ypN0  (b) on the Left, multiple microscopic foci of residual Invasive ductal carcinoma with evidence of dermal lymphatic involvement, pyT1a/T4 pyN0  (5) Postmastectomy radiation, completed 04/15/2012  (6) Started anastrazole 04/16/2012; normal dexa [facility] 05/19/2014 [facility] 09/11/2014 (7) CT angiogram 09/11/2014 shows new left pericardial effusion and new mediastinal and hilar lymphadenopathy; there were no suspicious upper abdominal findings; Cytology from the left effusion 09/11/2014 ([id number]) shows malignant cells which are HER-2 positive  (8) Whole body bone scan on 09/25/14 showed metastatic foci throughout axial and appendicular skeleton. Areas of most potential concern include disease in the thoracic spine, disease in the right acetabulum, right superior pubic ramus and possibly proximal right femur, disease in the right humeral shaft and in both femoral shafts.  (8) Started trastuzumab/pertuzumab 09/26/2014, to be repeated every 21 days indefinitely (a) echocardiogram 09/11/2014 shows a well preserved ejection fraction.  (9) Brain MRI on 09/25/14 was  positive for numerous small enhancing brain mets and calvarium/bone mets  (a) whole brain radiation on 10/09/14--10/27/2014  (10) to start zolendronate 11/29/2014, to be repeated every 12 weeks  Patient last received Herceptin/Perjeta/Zometa infusions on 11/29/2014.  Patient is scheduled for a repeat echo on 12/13/2014.  Patient is scheduled to return for labs and her next Herceptin/Perjeta infusion on 12/19/2014.

## 2014-12-11 NOTE — Telephone Encounter (Signed)
Discharge Date: 12/09/2014 from [facility]   Primary MD Leeanne Rio, [place]  Recommendations for primary care physician for things to follow:  - please check CBC, BMP during next visit. - please check 2 view chest xray during next visit, to follow on pleural effusion.  Unable to reach patient at time of [place] Call.  Left message for patient to return call when available.

## 2014-12-11 NOTE — Assessment & Plan Note (Signed)
Patient is complaining of left upper extremity edema within this past 1-2 weeks.  She denies any known injury or trauma to her left arm.  On exam.-Left arm edema noted; with no obvious injury.  Patient observed with full range of motion.  Also, patient has noted increased bilateral lower extremity peripheral edema within this past week.  She also complains of chronic shortness of breath and cough.  She states she's gained approximately 30 pounds within this past month.  She states that she has discontinued all of her blood pressure medication.  On exam.-+2 bilateral lower extremity edema noted.  Lungs essentially clear; with no obvious shortness of breath or cough.  Also, no wheeze noted.  Patient will be transported to the emergency department for further evaluation and management of all complaints today.  Patient will most likely require a Doppler ultrasound to rule out DVT to the left upper extremity.  If Doppler ultrasound is negative; patient would most likely benefit from a lymphedema clinic referral.  Brief history and report called to the emergency department charge nurse prior to transporting the patient to the emergency department for further evaluation and management via wheelchair.  Per the cancer Center nurse.

## 2014-12-11 NOTE — Assessment & Plan Note (Signed)
Patient reports a one-week history of minimal urine output.  She denies any dysuria, frequency, or hematuria.  She also denies any recent fevers or chills.  On exam.-There is no pelvic discomfort with palpation or flank pain.  Patient will be transported to the emergency department for further evaluation and management of all symptoms.

## 2014-12-11 NOTE — Progress Notes (Signed)
SYMPTOM MANAGEMENT CLINIC   HPI: Linda [name] 69 y.o. female diagnosed with bilateral breast cancer; with both brain and bone metastasis.  Patient is status post bilateral mastectomies, chemotherapy, and radiation therapy.  Currently undergoing Herceptin/Perjeta/Zometa therapy.  Patient presents to the [facility] today with multiple complaints.  She reports new onset left upper extremity edema within the past several days.  She denies any known injury or trauma to her left arm.  She is also noted increased bilateral lower extremity peripheral edema for the past week or so as well.  She complains of continued, chronic shortness of breath and cough.  She skinned approximately 30 pounds within this last month.  She's also noted a minimal urine output within this past week as well.  She denies any dysuria, frequency, hematuria, or fever/chills.  She denies any recent nausea/vomiting or diarrhea/constipation.   HPI  ROS  Past Medical History  Diagnosis Date  . Cancer 08-13-11    07-31-11-Dx. Bilarteral Breast cancer-left greater than rt.  . Hematuria, undiagnosed cause 08-13-11    Being evaluated by [facility] 08-14-11  . Hypertension   . Bronchitis     hx  . GERD (gastroesophageal reflux disease)     doing well  . 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 Seroma 02/04/12    right breast  200cc removed  erythema on right side  . Seasonal allergies   . History of radiation therapy 02/20/12-04/15/12    left breast,total 60.4 Gy  . Wears partial dentures     wears upper and lower partial  . Allergy   . Blood transfusion without reported diagnosis   . 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   . Cancer of central portion of left female breast 08/01/2011  . S/P radiation therapy 10/09/14-10/27/14    whole brain 37.5Gy/22f    Past  Surgical History  Procedure Laterality Date  . Child birth  08-13-11    x3 -NVD  . Portacath placement  08/15/2011    Procedure: INSERTION PORT-A-CATH;  Surgeon: DHarl Bowie MD;  Location: [facility];  Service: General;  Laterality: N/A;  . Mastectomy w/ sentinel node biopsy  01/21/2012    Procedure: MASTECTOMY WITH SENTINEL LYMPH NODE BIOPSY;  Surgeon: DHarl Bowie MD;  Location: [facility]  Service: General;  Laterality: Bilateral;  Left modified radical mastectomy, Rt mastectomy with Sentinel lymphnode biospy  . Breast surgery    . Port-a-cath removal Right 12/22/2012    Procedure: REMOVAL PORT-A-CATH;  Surgeon: DHarl Bowie MD;  Location: [facility]  Service: General;  Laterality: Right;    has Cough; Hypertension; GERD (gastroesophageal reflux disease); Seasonal allergies; Malignant pleural effusion; Bilateral breast cancer; Brain metastases; Pleural effusion, left; Pleural effusion; Anasarca; SOB (shortness of breath); Left upper extremity swelling; Pleural effusion on left; Peripheral edema; and Oliguria on her problem list.    is allergic to ace inhibitors.    Medication List       This list is accurate as of: 12/08/14  4:43 PM.  Always use your most recent med list.               anastrozole 1 MG tablet  Commonly known as:  ARIMIDEX  TAKE 1 TABLET BY MOUTH DAILY.     b complex vitamins tablet  Take 1  tablet by mouth daily.     benzonatate 100 MG capsule  Commonly known as:  TESSALON  Take 1 capsule (100 mg total) by mouth 3 (three) times daily as needed for cough.     calcium-vitamin D 500-200 MG-UNIT per tablet  Commonly known as:  OSCAL WITH D  Take 1 tablet by mouth 2 (two) times daily.     cholecalciferol 1000 UNITS tablet  Commonly known as:  VITAMIN D  Take 1 tablet (1,000 Units total) by mouth daily.     emollient cream  Commonly known as:  BIAFINE  Apply 1 application topically daily.     furosemide 40 MG  tablet  Commonly known as:  LASIX  Take 1 tablet (40 mg total) by mouth daily.     HYDROcodone-homatropine 5-1.5 MG/5ML syrup  Commonly known as:  HYCODAN  Take 5 mLs by mouth every 6 (six) hours as needed for cough.     lidocaine-prilocaine cream  Commonly known as:  EMLA  Apply 1 application topically as needed.     multivitamins with iron Tabs tablet  Take 1 tablet by mouth daily.     tobramycin-dexamethasone ophthalmic solution  Commonly known as:  TOBRADEX  Place 1 drop into the left eye every 4 (four) hours while awake.         PHYSICAL EXAMINATION  Oncology Vitals 12/09/2014 12/09/2014 12/09/2014 12/08/2014 12/08/2014 12/08/2014 12/08/2014  Height - - - - 175 cm - -  Weight - - - - 119.296 kg - -  Weight (lbs) - - - - 263 lbs - -  BMI (kg/m2) - - - - 38.84 kg/m2 - -  Temp 98.1 - 97.7 - 97.9 98.3 -  Pulse 89 - 78 88 101 95 92  Resp 18 - 16 - 16 15 20   Resp (Historical as of 01/15/12) - - - - - - -  SpO2 96 96 96 - 97 94 97  BSA (m2) - - - - 2.41 m2 - -   BP Readings from Last 3 Encounters:  12/09/14 107/78  12/08/14 142/80  12/04/14 132/78    Physical Exam  Constitutional: She is oriented to person, place, and time.  Patient appears fatigued, weak, and chronically ill.  HENT:  Head: Normocephalic and atraumatic.  Mouth/Throat: Oropharynx is clear and moist.  Eyes: Conjunctivae and EOM are normal. Pupils are equal, round, and reactive to light. Right eye exhibits no discharge. Left eye exhibits no discharge. No scleral icterus.  Neck: Normal range of motion. Neck supple. No JVD present. No tracheal deviation present. No thyromegaly present.  Cardiovascular: Normal rate, regular rhythm, normal heart sounds and intact distal pulses.   Pulmonary/Chest: Effort normal and breath sounds normal. No stridor. No respiratory distress. She has no wheezes. She has no rales. She exhibits no tenderness.  Abdominal: Soft. Bowel sounds are normal. She exhibits no distension and no  mass. There is no tenderness. There is no rebound and no guarding.  Musculoskeletal: Normal range of motion. She exhibits edema. She exhibits no tenderness.  +2 bilateral lower extremity edema to lower extremities and ankles/feet.  Also, left upper extremity with marked edema as well.  No obvious injury or trauma.  Lymphadenopathy:    She has no cervical adenopathy.  Neurological: She is alert and oriented to person, place, and time.  Skin: Skin is warm and dry. No rash noted. No erythema. There is pallor.  Psychiatric: Affect normal.  Nursing note and vitals reviewed.   LABORATORY DATA:. Appointment  on 12/08/2014  Component Date Value Ref Range Status  . WBC 12/08/2014 6.7  3.9 - 10.3 10e3/uL Final  . NEUT# 12/08/2014 4.6  1.5 - 6.5 10e3/uL Final  . HGB 12/08/2014 10.7* 11.6 - 15.9 g/dL Final  . HCT 12/08/2014 33.3* 34.8 - 46.6 % Final  . Platelets 12/08/2014 238  145 - 400 10e3/uL Final  . MCV 12/08/2014 94.9  79.5 - 101.0 fL Final  . MCH 12/08/2014 30.5  25.1 - 34.0 pg Final  . MCHC 12/08/2014 32.1  31.5 - 36.0 g/dL Final  . RBC 12/08/2014 3.51* 3.70 - 5.45 10e6/uL Final  . RDW 12/08/2014 16.9* 11.2 - 14.5 % Final  . lymph# 12/08/2014 1.2  0.9 - 3.3 10e3/uL Final  . MONO# 12/08/2014 0.8  0.1 - 0.9 10e3/uL Final  . Eosinophils Absolute 12/08/2014 0.1  0.0 - 0.5 10e3/uL Final  . Basophils Absolute 12/08/2014 0.0  0.0 - 0.1 10e3/uL Final  . NEUT% 12/08/2014 69.3  38.4 - 76.8 % Final  . LYMPH% 12/08/2014 17.9  14.0 - 49.7 % Final  . MONO% 12/08/2014 11.2  0.0 - 14.0 % Final  . EOS% 12/08/2014 1.3  0.0 - 7.0 % Final  . BASO% 12/08/2014 0.3  0.0 - 2.0 % Final  . Sodium 12/08/2014 136  136 - 145 mEq/L Final  . Potassium 12/08/2014 4.0  3.5 - 5.1 mEq/L Final  . Chloride 12/08/2014 104  98 - 109 mEq/L Final  . CO2 12/08/2014 23  22 - 29 mEq/L Final  . Glucose 12/08/2014 122  70 - 140 mg/dl Final  . BUN 12/08/2014 16.0  7.0 - 26.0 mg/dL Final  . Creatinine 12/08/2014 0.8  0.6 - 1.1  mg/dL Final  . Total Bilirubin 12/08/2014 0.47  0.20 - 1.20 mg/dL Final  . Alkaline Phosphatase 12/08/2014 84  40 - 150 U/L Final  . AST 12/08/2014 22  5 - 34 U/L Final  . ALT 12/08/2014 32  0 - 55 U/L Final  . Total Protein 12/08/2014 6.6  6.4 - 8.3 g/dL Final  . Albumin 12/08/2014 3.4* 3.5 - 5.0 g/dL Final  . Calcium 12/08/2014 8.7  8.4 - 10.4 mg/dL Final  . Anion Gap 12/08/2014 9  3 - 11 mEq/L Final  . EGFR 12/08/2014 78* >90 ml/min/1.73 m2 Final   eGFR is calculated using the CKD-EPI Creatinine Equation (2009)     RADIOGRAPHIC STUDIES: Dg Chest 1 View  12/09/2014   CLINICAL DATA:  Pleural effusion.  Post left-sided thoracentesis.  EXAM: CHEST  1 VIEW  COMPARISON:  Chest radiograph- 12/08/2014  FINDINGS: Interval reduction in persistent small left-sided effusion post thoracentesis. No pneumothorax.  Grossly unchanged enlarged cardiac silhouette and mediastinal contours given persistently reduced lung volumes. Stable position of support apparatus.  Improved aeration of the left lung base with persistent bibasilar opacities, left greater than right. No new focal airspace opacities.  Surgical clips overlie the left axilla. Osseous structures appear normal.  IMPRESSION: 1. Interval reduction in persistent small left-sided effusion post thoracentesis. No pneumothorax. 2. Improved aeration the left lung base with residual bibasilar opacities, left greater than right, likely atelectasis.   Electronically Signed   By: Sandi Mariscal M.D.   On: 12/09/2014 11:39   Dg Chest Port 1 View  12/08/2014   CLINICAL DATA:  Productive cough x1 month, history of pneumonia and breast cancer  EXAM: PORTABLE CHEST - 1 VIEW  COMPARISON:  11/29/2014  FINDINGS: Moderate to large left pleural effusion, increased.  Increased interstitial markings in  the left upper lobe with associated subpleural scarring, grossly unchanged from prior CT.  Right lung is essentially clear.  Cardiomegaly.  Right chest port terminates at the  cavoatrial junction.  IMPRESSION: Moderate to large left pleural effusion, increased.  Chronic changes in the left upper lobe, unchanged from prior CT.   Electronically Signed   By: Julian Hy M.D.   On: 12/08/2014 18:03   US Thoracentesis Asp Pleural Space W/img Guide  12/09/2014   INDICATION: Symptomatic left-sided sided pleural effusion  EXAM: US THORACENTESIS ASP PLEURAL SPACE W/IMG GUIDE  COMPARISON:  None.  MEDICATIONS: 10 cc 1% lidocaine  COMPLICATIONS: None immediate  TECHNIQUE: Informed written consent was obtained from the patient after a discussion of the risks, benefits and alternatives to treatment. A timeout was performed prior to the initiation of the procedure.  Initial ultrasound scanning demonstrates a left pleural effusion. The lower chest was prepped and draped in the usual sterile fashion. 1% lidocaine was used for local anesthesia. Under direct ultrasound guidance, a 19 gauge, 7-cm, Yueh catheter was introduced. An ultrasound image was saved for documentation purposes. The thoracentesis was performed. The catheter was removed and a dressing was applied. The patient tolerated the procedure well without immediate post procedural complication. The patient was escorted to have an upright chest radiograph.  FINDINGS: A total of approximately 750 cc of yellow fluid was removed. Requested samples were sent to the laboratory.  IMPRESSION: Successful ultrasound-guided L sided thoracentesis yielding 750 cc of pleural fluid.  Read by:  Lavonia Drafts [facility]   Electronically Signed   By: Sandi Mariscal M.D.   On: 12/09/2014 11:30    ASSESSMENT/PLAN:    Bilateral breast cancer (1) status post bilateral breast biopsies 07/24/2011, showing,    (a) on the right, a clinical T2 N0 papillary/ductal breast cancer, estrogen and progesterone receptor positive, HER-2 negative, with an MIB-1 of 10%;   (b) on the left, a clinical T3 N1, stage IIIA invasive ductal carcinoma, grade 3, triple positive,  with an MIB-1 of 60%.  (2) Status post 4 dose dense cycles of doxorubicin/ cyclophosphamide followed by 4 dose dense cycles of paclitaxel and trastuzumab completed 12/09/2011  (3) the trastuzumab was continued for a total of one year (to 11/01/2012). Final echo on 11/04/2012 showing a well preserved ejection fraction of 55-60%.  (4) s/p bilateral mastectomies 01/21/2012 showing  (a) on the Right, an 8 mm invasive papillary carcinoma, grade 1, ypT1b ypN0  (b) on the Left, multiple microscopic foci of residual Invasive ductal carcinoma with evidence of dermal lymphatic involvement, pyT1a/T4 pyN0  (5) Postmastectomy radiation, completed 04/15/2012  (6) Started anastrazole 04/16/2012; normal dexa scan 05/19/2014 at the [facility] 09/11/2014 (7) CT angiogram 09/11/2014 shows new left pericardial effusion and new mediastinal and hilar lymphadenopathy; there were no suspicious upper abdominal findings; Cytology from the left effusion 09/11/2014 ([id number]) shows malignant cells which are HER-2 positive  (8) Whole body bone scan on 09/25/14 showed metastatic foci throughout axial and appendicular skeleton. Areas of most potential concern include disease in the thoracic spine, disease in the right acetabulum, right superior pubic ramus and possibly proximal right femur, disease in the right humeral shaft and in both femoral shafts.  (8) Started trastuzumab/pertuzumab 09/26/2014, to be repeated every 21 days indefinitely (a) echocardiogram 09/11/2014 shows a well preserved ejection fraction.  (9) Brain MRI on 09/25/14 was positive for numerous small enhancing brain mets and calvarium/bone mets  (a) whole brain radiation on 10/09/14--10/27/2014  (  10) to start zolendronate 11/29/2014, to be repeated every 12 weeks  Patient last received Herceptin/Perjeta/Zometa infusions on 11/29/2014.  Patient is scheduled for a repeat echo  on 12/13/2014.  Patient is scheduled to return for labs and her next Herceptin/Perjeta infusion on 12/19/2014.  Oliguria Patient reports a one-week history of minimal urine output.  She denies any dysuria, frequency, or hematuria.  She also denies any recent fevers or chills.  On exam.-There is no pelvic discomfort with palpation or flank pain.  Patient will be transported to the emergency department for further evaluation and management of all symptoms.  Peripheral edema Patient is complaining of left upper extremity edema within this past 1-2 weeks.  She denies any known injury or trauma to her left arm.  On exam.-Left arm edema noted; with no obvious injury.  Patient observed with full range of motion.  Also, patient has noted increased bilateral lower extremity peripheral edema within this past week.  She also complains of chronic shortness of breath and cough.  She states she's gained approximately 30 pounds within this past month.  She states that she has discontinued all of her blood pressure medication.  On exam.-+2 bilateral lower extremity edema noted.  Lungs essentially clear; with no obvious shortness of breath or cough.  Also, no wheeze noted.  Patient will be transported to the emergency department for further evaluation and management of all complaints today.  Patient will most likely require a Doppler ultrasound to rule out DVT to the left upper extremity.  If Doppler ultrasound is negative; patient would most likely benefit from a lymphedema clinic referral.  Brief history and report called to the emergency department charge nurse prior to transporting the patient to the emergency department for further evaluation and management via wheelchair.  Per the cancer Center nurse.   Patient stated understanding of all instructions; and was in agreement with this plan of care. The patient knows to call the clinic with any problems, questions or concerns.   Review/collaboration with  Dr. Benay Spice (on call) regarding all aspects of patient's visit today.   Total time spent with patient was 40 minutes;  with greater than 75 percent of that time spent in face to face counseling regarding patient's symptoms,  and coordination of care and follow up.  Disclaimer: This note was dictated with voice recognition software. Similar sounding words can inadvertently be transcribed and may not be corrected upon review.   Drue Second, NP 12/11/2014

## 2014-12-12 ENCOUNTER — Telehealth: Payer: Self-pay

## 2014-12-12 ENCOUNTER — Other Ambulatory Visit: Payer: Self-pay | Admitting: *Deleted

## 2014-12-12 ENCOUNTER — Telehealth: Payer: Self-pay | Admitting: Oncology

## 2014-12-12 NOTE — Telephone Encounter (Signed)
Labs/ov added per 06/28 POF, pt is aware along with family who arranges transportation.... KJ

## 2014-12-12 NOTE — Telephone Encounter (Signed)
Called to follow up with pt after 6/24 visit with [facility]. Pt states she feels a little better, still has SOB and still has a little swelling in her arm. Informed pt she has a follow up with Dr.Magrinat tomorrow 12/13/14. Pt states she was unaware but will be in at 9am for labs and MD visit. Also clarified time for pt for ECHO at [facility] [place] tomorrow 6/29. Pt verbalized all understanding and denies any questions or concerns at this time.

## 2014-12-13 ENCOUNTER — Encounter (HOSPITAL_COMMUNITY): Payer: Self-pay

## 2014-12-13 ENCOUNTER — Inpatient Hospital Stay (HOSPITAL_COMMUNITY)
Admission: AD | Admit: 2014-12-13 | Discharge: 2014-12-22 | DRG: 270 | Disposition: A | Discharge status: Hospice | Payer: Medicare Other | Source: Ambulatory Visit | Attending: Internal Medicine | Admitting: Internal Medicine

## 2014-12-13 ENCOUNTER — Inpatient Hospital Stay (HOSPITAL_BASED_OUTPATIENT_CLINIC_OR_DEPARTMENT_OTHER)
Admission: RE | Admit: 2014-12-13 | Discharge: 2014-12-13 | Disposition: A | Discharge status: Home / Self Care | Payer: Medicare Other | Source: Ambulatory Visit | Attending: Oncology | Admitting: Oncology

## 2014-12-13 ENCOUNTER — Telehealth: Payer: Self-pay | Admitting: Oncology

## 2014-12-13 ENCOUNTER — Inpatient Hospital Stay (HOSPITAL_COMMUNITY): Payer: Medicare Other

## 2014-12-13 ENCOUNTER — Ambulatory Visit (HOSPITAL_BASED_OUTPATIENT_CLINIC_OR_DEPARTMENT_OTHER): Payer: Medicare Other | Admitting: Oncology

## 2014-12-13 ENCOUNTER — Telehealth: Payer: Self-pay | Admitting: *Deleted

## 2014-12-13 ENCOUNTER — Other Ambulatory Visit (HOSPITAL_BASED_OUTPATIENT_CLINIC_OR_DEPARTMENT_OTHER): Payer: Medicare Other

## 2014-12-13 VITALS — BP 131/65 | HR 95 | Temp 97.7°F | Resp 20 | Ht 69.0 in | Wt 267.6 lb

## 2014-12-13 DIAGNOSIS — Z79811 Long term (current) use of aromatase inhibitors: Secondary | ICD-10-CM

## 2014-12-13 DIAGNOSIS — I890 Lymphedema, not elsewhere classified: Secondary | ICD-10-CM | POA: Diagnosis present

## 2014-12-13 DIAGNOSIS — C50911 Malignant neoplasm of unspecified site of right female breast: Secondary | ICD-10-CM

## 2014-12-13 DIAGNOSIS — I5033 Acute on chronic diastolic (congestive) heart failure: Secondary | ICD-10-CM | POA: Diagnosis present

## 2014-12-13 DIAGNOSIS — Z923 Personal history of irradiation: Secondary | ICD-10-CM

## 2014-12-13 DIAGNOSIS — J90 Pleural effusion, not elsewhere classified: Secondary | ICD-10-CM

## 2014-12-13 DIAGNOSIS — R635 Abnormal weight gain: Secondary | ICD-10-CM

## 2014-12-13 DIAGNOSIS — R609 Edema, unspecified: Secondary | ICD-10-CM | POA: Diagnosis present

## 2014-12-13 DIAGNOSIS — I5031 Acute diastolic (congestive) heart failure: Secondary | ICD-10-CM | POA: Diagnosis present

## 2014-12-13 DIAGNOSIS — Z170 Estrogen receptor positive status [ER+]: Secondary | ICD-10-CM | POA: Diagnosis not present

## 2014-12-13 DIAGNOSIS — C7931 Secondary malignant neoplasm of brain: Secondary | ICD-10-CM

## 2014-12-13 DIAGNOSIS — I2782 Chronic pulmonary embolism: Secondary | ICD-10-CM | POA: Diagnosis not present

## 2014-12-13 DIAGNOSIS — J9600 Acute respiratory failure, unspecified whether with hypoxia or hypercapnia: Secondary | ICD-10-CM | POA: Diagnosis present

## 2014-12-13 DIAGNOSIS — J910 Malignant pleural effusion: Secondary | ICD-10-CM | POA: Diagnosis present

## 2014-12-13 DIAGNOSIS — C50912 Malignant neoplasm of unspecified site of left female breast: Principal | ICD-10-CM

## 2014-12-13 DIAGNOSIS — D0590 Unspecified type of carcinoma in situ of unspecified breast: Secondary | ICD-10-CM | POA: Diagnosis present

## 2014-12-13 DIAGNOSIS — K219 Gastro-esophageal reflux disease without esophagitis: Secondary | ICD-10-CM | POA: Diagnosis present

## 2014-12-13 DIAGNOSIS — I312 Hemopericardium, not elsewhere classified: Secondary | ICD-10-CM | POA: Diagnosis not present

## 2014-12-13 DIAGNOSIS — Z803 Family history of malignant neoplasm of breast: Secondary | ICD-10-CM | POA: Diagnosis not present

## 2014-12-13 DIAGNOSIS — E876 Hypokalemia: Secondary | ICD-10-CM | POA: Diagnosis not present

## 2014-12-13 DIAGNOSIS — I319 Disease of pericardium, unspecified: Secondary | ICD-10-CM

## 2014-12-13 DIAGNOSIS — I2699 Other pulmonary embolism without acute cor pulmonale: Secondary | ICD-10-CM | POA: Diagnosis not present

## 2014-12-13 DIAGNOSIS — Z888 Allergy status to other drugs, medicaments and biological substances status: Secondary | ICD-10-CM

## 2014-12-13 DIAGNOSIS — I314 Cardiac tamponade: Secondary | ICD-10-CM | POA: Diagnosis not present

## 2014-12-13 DIAGNOSIS — I10 Essential (primary) hypertension: Secondary | ICD-10-CM

## 2014-12-13 DIAGNOSIS — C50511 Malignant neoplasm of lower-outer quadrant of right female breast: Secondary | ICD-10-CM | POA: Diagnosis present

## 2014-12-13 DIAGNOSIS — R0601 Orthopnea: Secondary | ICD-10-CM | POA: Diagnosis present

## 2014-12-13 DIAGNOSIS — C7951 Secondary malignant neoplasm of bone: Secondary | ICD-10-CM | POA: Diagnosis present

## 2014-12-13 DIAGNOSIS — Z9013 Acquired absence of bilateral breasts and nipples: Secondary | ICD-10-CM | POA: Diagnosis present

## 2014-12-13 DIAGNOSIS — K5900 Constipation, unspecified: Secondary | ICD-10-CM | POA: Diagnosis present

## 2014-12-13 DIAGNOSIS — C50919 Malignant neoplasm of unspecified site of unspecified female breast: Secondary | ICD-10-CM | POA: Diagnosis present

## 2014-12-13 DIAGNOSIS — M7989 Other specified soft tissue disorders: Secondary | ICD-10-CM | POA: Diagnosis present

## 2014-12-13 DIAGNOSIS — I071 Rheumatic tricuspid insufficiency: Secondary | ICD-10-CM | POA: Diagnosis present

## 2014-12-13 DIAGNOSIS — Z66 Do not resuscitate: Secondary | ICD-10-CM | POA: Diagnosis present

## 2014-12-13 DIAGNOSIS — I313 Pericardial effusion (noninflammatory): Principal | ICD-10-CM | POA: Diagnosis present

## 2014-12-13 DIAGNOSIS — Z9689 Presence of other specified functional implants: Secondary | ICD-10-CM

## 2014-12-13 DIAGNOSIS — R601 Generalized edema: Secondary | ICD-10-CM | POA: Diagnosis not present

## 2014-12-13 DIAGNOSIS — R05 Cough: Secondary | ICD-10-CM | POA: Diagnosis present

## 2014-12-13 DIAGNOSIS — I3139 Other pericardial effusion (noninflammatory): Secondary | ICD-10-CM | POA: Diagnosis present

## 2014-12-13 DIAGNOSIS — Z79899 Other long term (current) drug therapy: Secondary | ICD-10-CM | POA: Diagnosis not present

## 2014-12-13 DIAGNOSIS — M899 Disorder of bone, unspecified: Secondary | ICD-10-CM

## 2014-12-13 DIAGNOSIS — C7800 Secondary malignant neoplasm of unspecified lung: Secondary | ICD-10-CM | POA: Diagnosis present

## 2014-12-13 DIAGNOSIS — R059 Cough, unspecified: Secondary | ICD-10-CM | POA: Diagnosis present

## 2014-12-13 DIAGNOSIS — R0602 Shortness of breath: Secondary | ICD-10-CM

## 2014-12-13 DIAGNOSIS — Z902 Acquired absence of lung [part of]: Secondary | ICD-10-CM

## 2014-12-13 HISTORY — DX: Other pulmonary embolism without acute cor pulmonale: I26.99

## 2014-12-13 HISTORY — DX: Acute on chronic diastolic (congestive) heart failure: I50.33

## 2014-12-13 LAB — APTT: aPTT: 27 seconds (ref 24–37)

## 2014-12-13 LAB — TYPE AND SCREEN
ABO/RH(D): O POS
Antibody Screen: NEGATIVE

## 2014-12-13 LAB — COMPREHENSIVE METABOLIC PANEL (CC13)
ALK PHOS: 79 U/L (ref 40–150)
ALT: 27 U/L (ref 0–55)
AST: 20 U/L (ref 5–34)
Albumin: 3.4 g/dL — ABNORMAL LOW (ref 3.5–5.0)
Anion Gap: 10 mEq/L (ref 3–11)
BUN: 32.1 mg/dL — AB (ref 7.0–26.0)
CO2: 21 mEq/L — ABNORMAL LOW (ref 22–29)
Calcium: 9.2 mg/dL (ref 8.4–10.4)
Chloride: 106 mEq/L (ref 98–109)
Creatinine: 1.1 mg/dL (ref 0.6–1.1)
EGFR: 51 mL/min/{1.73_m2} — ABNORMAL LOW (ref >90)
Glucose: 127 mg/dl (ref 70–140)
POTASSIUM: 4.2 meq/L (ref 3.5–5.1)
Sodium: 137 mEq/L (ref 136–145)
Total Bilirubin: 0.51 mg/dL (ref 0.20–1.20)
Total Protein: 6.6 g/dL (ref 6.4–8.3)

## 2014-12-13 LAB — PROTIME-INR
INR: 1.31 (ref 0.00–1.49)
Prothrombin Time: 16.4 seconds — ABNORMAL HIGH (ref 11.6–15.2)

## 2014-12-13 LAB — CBC WITH DIFFERENTIAL/PLATELET
BASO%: 0.7 % (ref 0.0–2.0)
Basophils Absolute: 0.1 10*3/uL (ref 0.0–0.1)
EOS%: 0.9 % (ref 0.0–7.0)
Eosinophils Absolute: 0.1 10*3/uL (ref 0.0–0.5)
HCT: 32.8 % — ABNORMAL LOW (ref 34.8–46.6)
HGB: 10.7 g/dL — ABNORMAL LOW (ref 11.6–15.9)
LYMPH#: 1.1 10*3/uL (ref 0.9–3.3)
LYMPH%: 13.6 % — AB (ref 14.0–49.7)
MCH: 30.1 pg (ref 25.1–34.0)
MCHC: 32.6 g/dL (ref 31.5–36.0)
MCV: 92.4 fL (ref 79.5–101.0)
MONO#: 0.8 10*3/uL (ref 0.1–0.9)
MONO%: 10 % (ref 0.0–14.0)
NEUT#: 6.2 10*3/uL (ref 1.5–6.5)
NEUT%: 74.8 % (ref 38.4–76.8)
PLATELETS: 265 10*3/uL (ref 145–400)
RBC: 3.55 10*6/uL — ABNORMAL LOW (ref 3.70–5.45)
RDW: 17.2 % — ABNORMAL HIGH (ref 11.2–14.5)
WBC: 8.3 10*3/uL (ref 3.9–10.3)

## 2014-12-13 LAB — BRAIN NATRIURETIC PEPTIDE: B Natriuretic Peptide: 21.1 pg/mL (ref 0.0–100.0)

## 2014-12-13 LAB — TSH: TSH: 1.542 u[IU]/mL (ref 0.350–4.500)

## 2014-12-13 LAB — ABO/RH: ABO/RH(D): O POS

## 2014-12-13 LAB — MRSA PCR SCREENING: MRSA by PCR: NEGATIVE

## 2014-12-13 MED ORDER — PROCHLORPERAZINE MALEATE 10 MG PO TABS: 10.0000 mg | ORAL_TABLET | Freq: Four times a day (QID) | ORAL | Status: DC | PRN

## 2014-12-13 MED ORDER — VITAMIN D 1000 UNITS PO TABS
1000.0000 [IU] | ORAL_TABLET | Freq: Every day | ORAL | Status: DC
  Administered 2014-12-15 – 2014-12-22 (×8): 1000 [IU] via ORAL
  Filled 2014-12-13 (×11): qty 1

## 2014-12-13 MED ORDER — PROCHLORPERAZINE MALEATE 10 MG PO TABS
10.0000 mg | ORAL_TABLET | Freq: Four times a day (QID) | ORAL | Status: DC | PRN
  Filled 2014-12-13: qty 1

## 2014-12-13 MED ORDER — ZOLPIDEM TARTRATE 5 MG PO TABS: 5.0000 mg | ORAL_TABLET | Freq: Every evening | ORAL | Status: DC | PRN

## 2014-12-13 MED ORDER — IOHEXOL 350 MG/ML SOLN
80.0000 mL | Freq: Once | INTRAVENOUS | Status: AC | PRN
  Administered 2014-12-13: 60 mL via INTRAVENOUS

## 2014-12-13 MED ORDER — NITROGLYCERIN 0.4 MG SL SUBL: 0.4000 mg | SUBLINGUAL_TABLET | SUBLINGUAL | Status: DC | PRN

## 2014-12-13 MED ORDER — ANASTROZOLE 1 MG PO TABS
1.0000 mg | ORAL_TABLET | Freq: Every day | ORAL | Status: DC
  Administered 2014-12-15 – 2014-12-22 (×8): 1 mg via ORAL
  Filled 2014-12-13 (×12): qty 1

## 2014-12-13 MED ORDER — LIDOCAINE-PRILOCAINE 2.5-2.5 % EX CREA: 1.0000 "application " | TOPICAL_CREAM | CUTANEOUS | Status: DC | PRN

## 2014-12-13 MED ORDER — TOBRAMYCIN-DEXAMETHASONE 0.3-0.1 % OP SUSP
1.0000 [drp] | OPHTHALMIC | Status: DC
  Administered 2014-12-13 – 2014-12-18 (×20): 1 [drp] via OPHTHALMIC
  Filled 2014-12-13: qty 2.5

## 2014-12-13 MED ORDER — ALPRAZOLAM 0.25 MG PO TABS: 0.2500 mg | ORAL_TABLET | Freq: Two times a day (BID) | ORAL | Status: DC | PRN

## 2014-12-13 MED ORDER — BENZONATATE 100 MG PO CAPS
200.0000 mg | ORAL_CAPSULE | Freq: Three times a day (TID) | ORAL | Status: DC
  Administered 2014-12-13 – 2014-12-18 (×13): 200 mg via ORAL
  Filled 2014-12-13 (×17): qty 2

## 2014-12-13 MED ORDER — HYDROCODONE-HOMATROPINE 5-1.5 MG/5ML PO SYRP
5.0000 mL | ORAL_SOLUTION | Freq: Four times a day (QID) | ORAL | Status: DC | PRN
  Administered 2014-12-13 – 2014-12-14 (×2): 5 mL via ORAL
  Filled 2014-12-13 (×3): qty 5

## 2014-12-13 MED ORDER — TAB-A-VITE/IRON PO TABS
1.0000 | ORAL_TABLET | Freq: Every day | ORAL | Status: DC
  Administered 2014-12-15 – 2014-12-16 (×2): 1 via ORAL
  Filled 2014-12-13 (×3): qty 1

## 2014-12-13 MED ORDER — ACETAMINOPHEN 325 MG PO TABS: 650.0000 mg | ORAL_TABLET | ORAL | Status: DC | PRN

## 2014-12-13 MED ORDER — B COMPLEX PO TABS: 1.0000 | ORAL_TABLET | Freq: Every day | ORAL | Status: DC

## 2014-12-13 MED ORDER — CALCIUM CARBONATE-VITAMIN D 500-200 MG-UNIT PO TABS
1.0000 | ORAL_TABLET | Freq: Two times a day (BID) | ORAL | Status: DC
  Administered 2014-12-13 – 2014-12-14 (×2): 1 via ORAL
  Filled 2014-12-13 (×5): qty 1

## 2014-12-13 MED ORDER — CEFUROXIME SODIUM 1.5 G IJ SOLR
1.5000 g | INTRAMUSCULAR | Status: AC
  Administered 2014-12-14: 1.5 g via INTRAVENOUS
  Filled 2014-12-13 (×2): qty 1.5

## 2014-12-13 MED ORDER — HEPARIN (PORCINE) IN NACL 100-0.45 UNIT/ML-% IJ SOLN
1650.0000 [IU]/h | INTRAMUSCULAR | Status: DC
  Administered 2014-12-13: 1650 [IU]/h via INTRAVENOUS
  Filled 2014-12-13 (×2): qty 250

## 2014-12-13 MED ORDER — B COMPLEX-C PO TABS
1.0000 | ORAL_TABLET | Freq: Every day | ORAL | Status: DC
  Administered 2014-12-15 – 2014-12-22 (×8): 1 via ORAL
  Filled 2014-12-13 (×10): qty 1

## 2014-12-13 MED ORDER — ONDANSETRON HCL 4 MG/2ML IJ SOLN: 4.0000 mg | Freq: Four times a day (QID) | INTRAMUSCULAR | Status: DC | PRN

## 2014-12-13 NOTE — Telephone Encounter (Signed)
Appointments made and avs printed for pt,pateint will get a call with her mri appointment and phy ther.   Linda [name]

## 2014-12-13 NOTE — Progress Notes (Signed)
Dr. Roxy Manns at bedside to see pt. Made aware of PE per CT results.

## 2014-12-13 NOTE — Telephone Encounter (Signed)
Per staff message and POF I have scheduled appts. Advised scheduler of appts. JMW  

## 2014-12-13 NOTE — Progress Notes (Signed)
*  PRELIMINARY RESULTS* Echocardiogram 2D Echocardiogram has been performed.  Leavy Cella 12/13/2014, 2:00 PM

## 2014-12-13 NOTE — Progress Notes (Signed)
ANTICOAGULATION CONSULT NOTE - Initial Consult  Pharmacy Consult for Heparin Indication: pulmonary embolus  Allergies  Allergen Reactions  . Ace Inhibitors Cough    Patient Measurements: Height: 5' 9"  (175.3 cm) Weight: 268 lb 1.3 oz (121.6 kg) IBW/kg (Calculated) : 66.2 Heparin Dosing Weight:  92 kg  Vital Signs: Temp: 97.8 F (36.6 C) (06/29 1609) Temp Source: Oral (06/29 1609) BP: 111/66 mmHg (06/29 1609) Pulse Rate: 101 (06/29 1609)  Labs:  Recent Labs  12/13/14 0923 12/13/14 0924 12/13/14 1703  HGB 10.7*  --   --   HCT 32.8*  --   --   PLT 265  --   --   APTT  --   --  27  LABPROT  --   --  16.4*  INR  --   --  1.31  CREATININE  --  1.1  --     Estimated Creatinine Clearance: 68.3 mL/min (by C-G formula based on Cr of 1.1).   Medical History: Past Medical History  Diagnosis Date  . Cancer 08-13-11    07-31-11-Dx. Bilarteral Breast cancer-left greater than rt.  . Hematuria, undiagnosed cause 08-13-11    Being evaluated by [facility] 08-14-11  . Hypertension   . Bronchitis     hx  . GERD (gastroesophageal reflux disease)     doing well  . 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 Seroma 02/04/12    right breast  200cc removed  erythema on right side  . Seasonal allergies   . History of radiation therapy 02/20/12-04/15/12    left breast,total 60.4 Gy  . Wears partial dentures     wears upper and lower partial  . Allergy   . Blood transfusion without reported diagnosis   . 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   . Cancer of central portion of left female breast 08/01/2011  . S/P radiation therapy 10/09/14-10/27/14    whole brain 37.5Gy/65f  . Acute pulmonary embolism 12/13/2014    Medications:  Prescriptions prior to admission  Medication Sig Dispense Refill Last Dose  . anastrozole (ARIMIDEX) 1  MG tablet TAKE 1 TABLET BY MOUTH DAILY. 30 tablet 5 12/08/2014 at 0830  . b complex vitamins tablet Take 1 tablet by mouth daily.   12/08/2014 at 0830  . benzonatate (TESSALON) 100 MG capsule Take 1 capsule (100 mg total) by mouth 3 (three) times daily as needed for cough. 20 capsule 2   . calcium-vitamin D (OSCAL WITH D) 500-200 MG-UNIT per tablet Take 1 tablet by mouth 2 (two) times daily. 100 tablet 12 12/08/2014 at 0830  . cholecalciferol (VITAMIN D) 1000 UNITS tablet Take 1 tablet (1,000 Units total) by mouth daily. 100 tablet 12 12/08/2014 at 0830  . furosemide (LASIX) 40 MG tablet Take 1 tablet (40 mg total) by mouth daily. 30 tablet 0   . HYDROcodone-homatropine (HYCODAN) 5-1.5 MG/5ML syrup Take 5 mLs by mouth every 6 (six) hours as needed for cough. 240 mL 0   . lidocaine-prilocaine (EMLA) cream Apply 1 application topically as needed. (Patient taking differently: Apply 1 application topically as needed (pain, irritation). ) 30 g 1 unknown  . Multiple Vitamins-Iron (MULTIVITAMINS WITH IRON) TABS Take 1 tablet by mouth daily.   12/08/2014 at 0830  . prochlorperazine (COMPAZINE) 10 MG tablet Take 1 tablet (10 mg total) by mouth every 6 (  six) hours as needed (Nausea or vomiting). 30 tablet 1   . tobramycin-dexamethasone (TOBRADEX) ophthalmic solution Place 1 drop into the left eye every 4 (four) hours while awake.   12/08/2014 at 1200    Assessment: 69 y/o F with metastatic breast cancer  Hospitalized 09/10/2014>>09/12/2014 with pleural effusion, s/p thoracentesis, pericardial effusion with malignant cells which were HER-2 positive. 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 Admitted 06/24>>06/25 w/ SOB, s/p thoracentesis w/ 750 cc out, L arm edema, but no DVT on [place]. 06/29 een by Dr Jana Hakim (onc) and was SOB, increased LUE edema, weight gain (36 lbs in 5-6 wks), constipation, eye drainage. Came to hospital today for scheduled f/u echo. Large  pericardial effusion seen and cardiology asked to admit. 12/13/2014 EF 55-60%, lg pericardial effusion, + tamponade. 6/29 CT +PE.  Baseline Labs: Scr 1.1, CrCl 68, WBC 8.3, H/H 10.7/32/8, Plts 265, INR 1.31  Goal of Therapy:  Heparin level 0.3-0.7 units/ml Monitor platelets by anticoagulation protocol: Yes   Plan:  Heparin (no bolus per MD request) at 1650 units/hr Stop heparin at 0300 for surgery 6/30. Will not check HL in AM at 0500 with infusion to be turned off for surgery.   Kazoua Gossen S. Alford Highland, PharmD, BCPS Clinical Staff Pharmacist Pager [phone]  Eilene Ghazi Stillinger 12/13/2014,6:51 PM

## 2014-12-13 NOTE — Progress Notes (Signed)
ID: Linda [name]   DOB: 1945/08/27  MR#: [id number]  MAU#:[id number]  PCP: Leeanne Rio, PA-C GYN: SU: Coralie Keens OTHER MD: Crissie Reese, Linna Hoff Bensimhon  CHIEF COMPLAINT:  Bilateral Breast Cancers  CURRENT TREATMENT: Anastrozole, trastuzumab/pertuzumab, abraxane  BREAST CANCER HISTORY: From the original intake note:  The patient noted a small amount of drainage from her left breast December of 2012. She brought it to her gynecologist's attention in January of 2013 and was set up for bilateral diagnostic mammography at the [facility] Center July 24, 2011. This was the patient's first ever mammogram. It showed a large irregular mass in the left retroareolar region extending to the nipple, with nipple retraction and skin thickening. This measured approximately 8.4 cm. It was associated with pleomorphic microcalcifications. By exam there was moderate distortion and retraction of the nipple with a large palpable ill-defined area in the retroareolar region. In the right right breast there was also a 2 cm hard mass palpated. Ultrasound of the right breast mass showed a complex cystic/solid area measuring 1.9 cm. Ultrasound of the right axilla was negative. Ultrasound of the left breast showed a large hypoechoic mass measuring at least 3.8 cm. The left axilla showed several adjacent abnormal appearing lymph nodes.  With this information biopsy of the right and left breast masses were obtained the same day, and showed ([id number])   (a) on the right, and invasive ductal carcinoma with papillary features, estrogen and progesterone receptor positive, HER-2 negative, with an MIB-1 of 10%.   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 The MIB-1 was 60% for the left-sided tumor.   With this information the patient was presented at the multidisciplinary breast cancer conference 08/06/2011.  Subsequent evaluation and treatments are as detailed below.  INTERVAL HISTORY: Linda [name] returns today for follow up of her metastatic breast cancer, accompanied by her daughter-in-law Linda [name] (who is a Marine scientist). Linda [name] has not been doing well. She had significant shortness of breath leading to repeat left thoracentesis last week. She tolerated the procedure well. She did not feel any better after that. Her left upper extremity which was a little swollen is not considerably more swollen. She is significantly constipated, and has not had a bowel movement in a week. She has picked up a lot of weight, from 536 pounds late May 2 268 pounds today. This is going to be fluid weight. She was started on Lasix, but she is not urinating much. She continues to have problems with drainage from her left eye.  REVIEW OF SYSTEMS: Linda [name] not having any headaches, she does have occasional nausea. She doesn't spend any time in bed. She does what housework she is able to do. There has been no rash, no fever, and no bleeding. A detailed review of systems today was otherwise stable except as noted above  PAST MEDICAL HISTORY: Past Medical History  Diagnosis Date  . Cancer 08-13-11    07-31-11-Dx. Bilarteral Breast cancer-left greater than rt.  . Hematuria, undiagnosed cause 08-13-11    Being evaluated by [facility] 08-14-11  . Hypertension   . Bronchitis     hx  . GERD (gastroesophageal reflux disease)     doing well  . 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 Seroma 02/04/12    right breast  200cc removed  erythema on right side  . Seasonal allergies   . History of radiation therapy 02/20/12-04/15/12    left breast,total 60.4 Gy  . Wears partial dentures     wears upper and lower partial  . Allergy   . Blood transfusion without reported diagnosis   . Radiation-induced dermatitis 03/26/2012    Using radioplex cream,  plus neosporin.   . Cancer of central portion of left female breast 08/01/2011  . S/P radiation therapy 10/09/14-10/27/14    whole brain 37.5Gy/59f    PAST SURGICAL HISTORY: Past Surgical History  Procedure Laterality Date  . Child birth  08-13-11    x3 -NVD  . Portacath placement  08/15/2011    Procedure: INSERTION PORT-A-CATH;  Surgeon: DHarl Bowie MD;  Location: [facility];  Service: General;  Laterality: N/A;  . Mastectomy w/ sentinel node biopsy  01/21/2012    Procedure: MASTECTOMY WITH SENTINEL LYMPH NODE BIOPSY;  Surgeon: DHarl Bowie MD;  [facility]  Service: General;  Laterality: Bilateral;  Left modified radical mastectomy, Rt mastectomy with Sentinel lymphnode biospy  . Breast surgery    . Port-a-cath removal Right 12/22/2012    Procedure: REMOVAL PORT-A-CATH;  Surgeon: DHarl Bowie MD;  [facility]  Service: General;  Laterality: Right;    FAMILY HISTORY Family History  Problem Relation Age of Onset  . Heart disease Mother   . Cancer Mother 448   breast, , 774deceased  . Heart attack Father   . Cancer Sister 657   breast  . Colon cancer Neg Hx   The patient's father died from a heart attack at the age of 864 The patient's mother died from apparently heart problems at the age of 870 The patient had no brothers. She has 3 sisters. One of her sisters was diagnosed with breast cancer in her mid 621s The patient does not know if his sister was ever genetically tested. The patient's mother had a mastectomy at the age of 380 presumably for breast cancer. There is no other history of breast or in cancer in the family to her knowledge.   GYNECOLOGIC HISTORY:  She does not recall when she had menarche. She had her first child at age 69 She is GX P3. She underwent menopause in her mid 467s She never took hormone replacement.   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 [place] has a cGames developer[place] Son [place]erald [place]ab[place]ives [place] works as a fAirline pilot His wife is a nMarine scientist Son DShanon Browlives at [employer]and is a dAdvertising account executivein addition to having the mTenne[employer] Inc The patient attends a local BLehman Brothershere   ADVANCED DIRECTIVES: Not in place; Not in place. At the clinic visit 12/13/2014 the patient was given the appropriate forms to complete and notarize at her discretion. She tells me she intends to name her daughter-in-law BJacqlyn Larsenas her healthcare power of attorney  HEALTH MAINTENANCE: History  Substance Use Topics  . Smoking status: Never Smoker   . Smokeless tobacco: Never Used  . Alcohol Use: 0.0 oz/week    0 Standard drinks or equivalent per week     Comment: rare- occ.     Colonoscopy: Never  PAP: Feb 2013  Bone density:  November 2013, normal  Lipid panel:   Allergies  Allergen Reactions  . Ace Inhibitors Cough    Current Outpatient Prescriptions  Medication Sig Dispense Refill  . anastrozole (ARIMIDEX) 1 MG tablet TAKE 1 TABLET BY MOUTH DAILY. 30 tablet 5  . b complex vitamins tablet Take 1 tablet by mouth daily.    . benzonatate (TESSALON) 100 MG capsule Take 1 capsule (100 mg total) by mouth 3 (three) times daily as needed for cough. 20 capsule 2  . calcium-vitamin D (OSCAL WITH D) 500-200 MG-UNIT per tablet Take 1 tablet by mouth 2 (two) times daily. 100 tablet 12  . cholecalciferol (VITAMIN D) 1000 UNITS tablet Take 1 tablet (1,000 Units total) by mouth daily. 100 tablet 12  . furosemide (LASIX) 40 MG tablet Take 1 tablet (40 mg total) by mouth daily. 30 tablet 0  . HYDROcodone-homatropine (HYCODAN) 5-1.5 MG/5ML syrup Take 5 mLs by mouth every 6 (six) hours as needed for cough. 240 mL 0  . lidocaine-prilocaine (EMLA) cream Apply 1 application topically as needed. (Patient taking differently: Apply 1  application topically as needed (pain, irritation). ) 30 g 1  . Multiple Vitamins-Iron (MULTIVITAMINS WITH IRON) TABS Take 1 tablet by mouth daily.    [name] tobramycin-dexamethasone (TOBRADEX) ophthalmic solution Place 1 drop into the left eye every 4 (four) hours while awake.     No current facility-administered medications for this visit.    OBJECTIVE: [name] who appears stated age 37 Vitals:   12/13/14 0935  BP: 131/65  Pulse: 95  Temp: 97.7 F (36.5 C)  Resp: 20     Body mass index is 39.5 kg/(m^2).    ECOG FS: 2 Filed Weights   12/13/14 0935  Weight: 267 lb 9.6 oz (121.383 kg)   Sclerae unicteric, EOMs intact Oropharynx clear, slightly dry No cervical or supraclavicular adenopathy Lungs no rales or rhonchi Heart regular rate and rhythm Abd soft, obese, nontender, positive bowel sounds MSK no focal spinal tenderness, grade 2 left upper extremity lymphedema; anasarca Neuro: nonfocal, well oriented, stoical affect Breasts: Deferred   LAB RESULTS: Lab Results  Component Value Date   WBC 8.3 12/13/2014   NEUTROABS 6.2 12/13/2014   HGB 10.7* 12/13/2014   HCT 32.8* 12/13/2014   MCV 92.4 12/13/2014   PLT 265 12/13/2014      Chemistry      Component Value Date/Time   NA 136 12/09/2014 0555   NA 136 12/08/2014 1438   K 4.0 12/09/2014 0555   K 4.0 12/08/2014 1438   CL 101 12/09/2014 0555   CL 107 11/01/2012 1045   CO2 27 12/09/2014 0555   CO2 23 12/08/2014 1438   BUN 19 12/09/2014 0555   BUN 16.0 12/08/2014 1438   CREATININE 0.99 12/09/2014 0555   CREATININE 0.8 12/08/2014 1438   CREATININE 0.61 06/30/2013 0959      Component Value Date/Time   CALCIUM 8.9 12/09/2014 0555   CALCIUM 8.7 12/08/2014 1438   ALKPHOS 80 12/08/2014 1758   ALKPHOS 84 12/08/2014 1438   AST 24 12/08/2014 1758   AST 22 12/08/2014 1438   ALT 33 12/08/2014 1758   ALT 32 12/08/2014 1438   BILITOT 0.8 12/08/2014 1758   BILITOT 0.47 12/08/2014 1438       STUDIES: Dg  Chest 1 View  12/09/2014   CLINICAL DATA:  Pleural effusion.  Post left-sided thoracentesis.  EXAM: CHEST  1 VIEW  COMPARISON:  Chest radiograph- 12/08/2014  FINDINGS: Interval reduction in persistent small  left-sided effusion post thoracentesis. No pneumothorax.  Grossly unchanged enlarged cardiac silhouette and mediastinal contours given persistently reduced lung volumes. Stable position of support apparatus.  Improved aeration of the left lung base with persistent bibasilar opacities, left greater than right. No new focal airspace opacities.  Surgical clips overlie the left axilla. Osseous structures appear normal.  IMPRESSION: 1. Interval reduction in persistent small left-sided effusion post thoracentesis. No pneumothorax. 2. Improved aeration the left lung base with residual bibasilar opacities, left greater than right, likely atelectasis.   Electronically Signed   By: Sandi Mariscal M.D.   On: 12/09/2014 11:39   Dg Chest 2 View  11/29/2014   CLINICAL DATA:  Breast cancer  EXAM: CHEST  2 VIEW  COMPARISON:  09/11/2014  FINDINGS: Small left pleural effusion shows mild interval improvement. Mild left lower lobe atelectasis also improved. Minimal pleural effusion on the right.  Cardiac enlargement. Pericardial effusion noted on prior CT. Negative for heart failure or edema. Port-A-Cath tip in the lower SVC.  IMPRESSION: Mild improvement in left pleural effusion and left lower lobe atelectasis. Minimal right pleural effusion.  Cardiac enlargement consistent with pericardial effusion.   Electronically Signed   By: Franchot Gallo M.D.   On: 11/29/2014 15:58   Dg Chest Port 1 View  12/08/2014   CLINICAL DATA:  Productive cough x1 month, history of pneumonia and breast cancer  EXAM: PORTABLE CHEST - 1 VIEW  COMPARISON:  11/29/2014  FINDINGS: Moderate to large left pleural effusion, increased.  Increased interstitial markings in the left upper lobe with associated subpleural scarring, grossly unchanged from prior  CT.  Right lung is essentially clear.  Cardiomegaly.  Right chest port terminates at the cavoatrial junction.  IMPRESSION: Moderate to large left pleural effusion, increased.  Chronic changes in the left upper lobe, unchanged from prior CT.   Electronically Signed   By: Julian Hy M.D.   On: 12/08/2014 18:03   US Thoracentesis Asp Pleural Space W/img Guide  12/09/2014   INDICATION: Symptomatic left-sided sided pleural effusion  EXAM: US THORACENTESIS ASP PLEURAL SPACE W/IMG GUIDE  COMPARISON:  None.  MEDICATIONS: 10 cc 1% lidocaine  COMPLICATIONS: None immediate  TECHNIQUE: Informed written consent was obtained from the patient after a discussion of the risks, benefits and alternatives to treatment. A timeout was performed prior to the initiation of the procedure.  Initial ultrasound scanning demonstrates a left pleural effusion. The lower chest was prepped and draped in the usual sterile fashion. 1% lidocaine was used for local anesthesia. Under direct ultrasound guidance, a 19 gauge, 7-cm, Yueh catheter was introduced. An ultrasound image was saved for documentation purposes. The thoracentesis was performed. The catheter was removed and a dressing was applied. The patient tolerated the procedure well without immediate post procedural complication. The patient was escorted to have an upright chest radiograph.  FINDINGS: A total of approximately 750 cc of yellow fluid was removed. Requested samples were sent to the laboratory.  IMPRESSION: Successful ultrasound-guided L sided thoracentesis yielding 750 cc of pleural fluid.  Read by:  Lavonia Drafts [facility]   Electronically Signed   By: Sandi Mariscal M.D.   On: 12/09/2014 11:30     ASSESSMENT: 69 y.o.  [place] woman   (1)  status post bilateral breast biopsies 07/24/2011, showing,      (a) on the right, a clinical T2 N0 papillary/ductal breast cancer, estrogen and progesterone receptor positive, HER-2 negative, with an MIB-1 of 10%;     (b) on the  left, a clinical T3 N1, stage IIIA invasive ductal carcinoma, grade 3, triple positive, with an MIB-1 of 60%.  (2)  Status post 4 dose dense cycles of doxorubicin/ cyclophosphamide followed by 4 dose dense cycles of paclitaxel and trastuzumab completed 12/09/2011  (3) the trastuzumab was continued for a total of one year (to 11/01/2012). Final echo on 11/04/2012 showing a well preserved ejection fraction of 55-60%.  (4) s/p bilateral mastectomies 01/21/2012 showing   (a) on the Right, an 8 mm invasive papillary carcinoma, grade 1, ypT1b ypN0   (b) on the Left, multiple microscopic foci of residual  Invasive ductal carcinoma with evidence of dermal lymphatic involvement, pyT1a/T4 pyN0  (5)  Postmastectomy radiation, completed 04/15/2012  (6) Started anastrazole 04/16/2012; normal dexa scan 05/19/2014 at the [facility] 09/11/2014 (7) CT angiogram 09/11/2014 shows new left pericardial effusion and new mediastinal and hilar lymphadenopathy; there were no suspicious upper abdominal findings; Cytology from the left effusion 09/11/2014 ([id number]) shows malignant cells which are HER-2 positive  (8) Whole body bone scan on 09/25/14 showed metastatic foci throughout axial and appendicular skeleton. Areas of most potential concern include disease in the thoracic spine, disease in the right acetabulum, right superior pubic ramus and possibly proximal right femur, disease in the right humeral shaft and in both femoral shafts.  (8) Started trastuzumab/pertuzumab 09/26/2014, to be repeated every 21 days indefinitely  (a) echocardiogram 09/11/2014 shows a well preserved ejection fraction.  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 The weight gain can only partly be explained through the low I'll be min, since the albumin is still above 3. I wonder if she is developing congestive heart failure and she is scheduled for an echocardiogram later today. In the meantime she will continue Lasix. She understands this to hydrate center but does not directly removed third space fluid. If we need to repeat thoracentesis she may need a Pleurx placement at some point  The left upper extremity lymphedema needs to be addressed and I am placing a referral to physical therapy to take care of that.  Today I wrote out a bowel prophylaxis regimen for her. She will be on stool softeners 2 tablets twice daily and MiraLAX daily. In addition she will take magnesium citrate today.  We need to intensify her treatment if were going to obtain control. We did discuss the possibility of switching over to a purely comfort care approach, but she feels she could tolerate some therapy, and I am going to add Abraxane to her trastuzumab and pertuzumab. She has a good understanding of the possible toxicities, side effects and complications of Abraxane. We will try to give her the first dose on 12/19/2014, with her next trastuzumab/pertuzumab treatment.  I am also obtaining a brain MRI. If there has been significant progression in the brain, I think we would have to reconsider plans regarding aggressive therapy and consider hospice. In the meantime I think she will benefit from an in home palliative care consult and this was placed today.  I also gave Linda [name] a copy of the living will and health care power of attorney documents. I urged her to  get them completed since she is still totally competent and it will spare her family difficult decisions later on. We will follow-up on this at the next visit here, which will be on July 12, unless problems develop before  then.    Chauncey Cruel, MD   12/13/2014

## 2014-12-13 NOTE — Consult Note (Addendum)
[address]       [place] [address]             [place] REPORT  PCP is Leeanne Rio, PA-C Referring Provider is Bensimhon, Shaune Pascal, MD Primary Oncologist is Magrinat, Virgie Dad, MD  Reason for consultation:  Pericardial Effusion  HPI:  Patient is a 69 year old obese white female who recently presented with metastatic rest cancer and has been referred for surgical consultation due to the presence of an enlarging pericardial effusion. The patient was originally diagnosed with breast cancer in 2013. She did well until February of this year when she developed shortness of breath and cough.  She was hospitalized in March with worsened cough and shortness of breath. Chest CT scan performed at that time revealed a small pericardial effusion and large bilateral pleural effusions, left greater than right. She underwent left thoracentesis which revealed malignant cells in the fluid consistent with stage IV metastatic breast cancer.  Whole body bone scan revealed diffuse metastatic foci throughout the axial skeleton.  MRI of the brain performed 09/25/2014 confirmed the presence of numerous small metastatic foci in the brain and calvarium.  She was treated with chemotherapy and radiation therapy.  She initially did well but she subsequently developed progressive shortness of breath and anasarca.  Her left sided pleural effusion recurred and she underwent repeat thoracentesis on 12/09/2014. The patient underwent follow-up transthoracic echocardiogram earlier today that revealed a large free-flowing pericardial effusion with early signs of pericardial tamponade. The patient was admitted to the hospital and cardia thoracic surgical consultation was requested.  The patient is single and lives with her son locally in [place]. She recently worked as an Optometrist for [employer].  She has been out of work for the last few months  because of her health problems. She lives a sedentary lifestyle. She describes progressive symptoms of exertional shortness of breath, abdominal swelling, and fatigue. She denies any symptoms of chest pain or chest tightness. She denies pleuritic chest pain. She has a persistent nonproductive cough. She has severe lower extremity edema. She is not having headaches or visual disturbances  Past Medical History  Diagnosis Date  . Cancer 08-13-11    07-31-11-Dx. Bilarteral Breast cancer-left greater than rt.  . Hematuria, undiagnosed cause 08-13-11    Being evaluated by [facility] 08-14-11  . Hypertension   . Bronchitis     hx  . GERD (gastroesophageal reflux disease)     doing well  . 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 Seroma 02/04/12    right breast  200cc removed  erythema on right side  . Seasonal allergies   . History of radiation therapy 02/20/12-04/15/12    left breast,total 60.4 Gy  . Wears partial dentures     wears upper and lower partial  . Allergy   . Blood transfusion without reported diagnosis   . 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   . Cancer of central portion of left female breast 08/01/2011  . S/P radiation therapy 10/09/14-10/27/14    whole brain 37.5Gy/75fx    Past Surgical History  Procedure Laterality Date  . Child birth  08-13-11    x3 -NVD  . Portacath placement  08/15/2011    Procedure:  INSERTION PORT-A-CATH;  Surgeon: Harl Bowie, MD;  Location: [facility];  Service: General;  Laterality: N/A;  . Mastectomy w/ sentinel node biopsy  01/21/2012    Procedure: MASTECTOMY WITH SENTINEL LYMPH NODE BIOPSY;  Surgeon: Harl Bowie, MD;  Location: [facility];  Service: General;  Laterality: Bilateral;  Left modified radical mastectomy, Rt mastectomy with Sentinel lymphnode biospy  . Breast  surgery    . Port-a-cath removal Right 12/22/2012    Procedure: REMOVAL PORT-A-CATH;  Surgeon: Harl Bowie, MD;  Location: [facility];  Service: General;  Laterality: Right;    Family History  Problem Relation Age of Onset  . Heart disease Mother   . Cancer Mother 59    breast, , 25 deceased  . Heart attack Father   . Cancer Sister 28    breast  . Colon cancer Neg Hx     History   Social History  . Marital Status: Single    Spouse Name: N/A  . Number of Children: 3  . Years of Education: N/A   Occupational History  . Retired    Social History Main Topics  . Smoking status: Never Smoker   . Smokeless tobacco: Never Used  . Alcohol Use: 0.0 oz/week    0 Standard drinks or equivalent per week     Comment: rare- occ.  . Drug Use: No  . Sexual Activity: No     Comment: gxp3, 1st child age 21, menopause mid 38's,no hrt   Other Topics Concern  . Not on file   Social History Narrative   Lives with son.  Single.  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 She moved to [place] about 2 years ago but has a Games developer in [place]. Son Gerald Stabs lives in [place] and works as a Airline pilot. His wife is a Marine scientist. Son Shanon Brow lives at [place] and is a Advertising account executive in addition to having the [employer]. The patient attends a local [employer] here.    Prior to Admission medications   Medication Sig Start Date End Date Taking? Authorizing Provider  anastrozole (ARIMIDEX) 1 MG tablet TAKE 1 TABLET BY MOUTH DAILY. 10/27/14   Chauncey Cruel, MD  b complex vitamins tablet Take 1 tablet by mouth daily.    Historical Provider, MD  benzonatate (TESSALON) 100 MG capsule Take 1 capsule (100 mg total) by mouth 3 (three) times daily as needed for cough. 12/09/14   Silver Huguenin Elgergawy, MD  calcium-vitamin D (OSCAL WITH D) 500-200 MG-UNIT per tablet Take 1  tablet by mouth 2 (two) times daily. 11/08/14   Chauncey Cruel, MD  cholecalciferol (VITAMIN D) 1000 UNITS tablet Take 1 tablet (1,000 Units total) by mouth daily. 11/08/14   Chauncey Cruel, MD  furosemide (LASIX) 40 MG tablet Take 1 tablet (40 mg total) by mouth daily. 12/09/14   Silver Huguenin Elgergawy, MD  HYDROcodone-homatropine (HYCODAN) 5-1.5 MG/5ML syrup Take 5 mLs by mouth every 6 (six) hours as needed for cough. 12/09/14   Silver Huguenin Elgergawy, MD  lidocaine-prilocaine (EMLA) cream Apply 1 application topically as needed. Patient taking differently: Apply 1 application topically as needed (pain, irritation).  10/17/14   Laurie Panda, NP  Multiple Vitamins-Iron (MULTIVITAMINS WITH IRON) TABS Take 1 tablet by mouth daily.    Historical Provider, MD  prochlorperazine (COMPAZINE) 10 MG  tablet Take 1 tablet (10 mg total) by mouth every 6 (six) hours as needed (Nausea or vomiting). 12/13/14   Chauncey Cruel, MD  tobramycin-dexamethasone [facility]) ophthalmic solution Place 1 drop into the left eye every 4 (four) hours while awake.    Historical Provider, MD    Current Facility-Administered Medications  Medication Dose Route Frequency Provider Last Rate Last Dose  . acetaminophen (TYLENOL) tablet 650 mg  650 mg Oral Q4H PRN Rhonda G Barrett, PA-C      . ALPRAZolam (XANAX) tablet 0.25 mg  0.25 mg Oral BID PRN Lonn Georgia, PA-C      . [START ON 12/14/2014] anastrozole (ARIMIDEX) tablet 1 mg  1 mg Oral Daily Rhonda G Barrett, PA-C      . [START ON 12/14/2014] B-complex with vitamin C tablet 1 tablet  1 tablet Oral Daily Shaune Pascal Bensimhon, MD      . benzonatate (TESSALON) capsule 200 mg  200 mg Oral TID Rhonda G Barrett, PA-C      . calcium-vitamin D (OSCAL WITH D) 500-200 MG-UNIT per tablet 1 tablet  1 tablet Oral BID Rhonda G Barrett, PA-C      . [START ON 12/14/2014] cholecalciferol (VITAMIN D) tablet 1,000 Units  1,000 Units Oral Daily Rhonda G Barrett, PA-C      . HYDROcodone-homatropine  (HYCODAN) 5-1.5 MG/5ML syrup 5 mL  5 mL Oral Q6H PRN Rhonda G Barrett, PA-C      . lidocaine-prilocaine (EMLA) cream 1 application  1 application Topical PRN Lonn Georgia, PA-C      . [START ON 12/14/2014] multivitamins with iron tablet 1 tablet  1 tablet Oral Daily Rhonda G Barrett, PA-C      . nitroGLYCERIN (NITROSTAT) SL tablet 0.4 mg  0.4 mg Sublingual Q5 Min x 3 PRN Rhonda G Barrett, PA-C      . ondansetron (ZOFRAN) injection 4 mg  4 mg Intravenous Q6H PRN Rhonda G Barrett, PA-C      . prochlorperazine (COMPAZINE) tablet 10 mg  10 mg Oral Q6H PRN Rhonda G Barrett, PA-C      . tobramycin-dexamethasone (TOBRADEX) ophthalmic suspension 1 drop  1 drop Left Eye Q4H while awake Rhonda G Barrett, PA-C      . zolpidem (AMBIEN) tablet 5 mg  5 mg Oral QHS PRN Rhonda G Barrett, PA-C        Allergies  Allergen Reactions  . Ace Inhibitors Cough      Review of Systems:   General:  poor appetite, decreased energy, + weight gain, no weight loss, no fever  Cardiac:  no chest pain with exertion, no chest pain at rest, + SOB with exertion, + resting SOB, no PND, + orthopnea, no palpitations, no arrhythmia, no atrial fibrillation, + LE edema, no dizzy spells, no syncope  Respiratory:  + shortness of breath, no home oxygen, no productive cough, + dry cough, no bronchitis, no wheezing, no hemoptysis, no asthma, no pain with inspiration or cough, no sleep apnea, no CPAP at night  GI:   no difficulty swallowing, no reflux, no frequent heartburn, no hiatal hernia, no abdominal pain, + constipation, no diarrhea, no hematochezia, no hematemesis, no melena  GU:   no dysuria,  no frequency, no urinary tract infection, no hematuria, no kidney stones, no kidney disease  Vascular:  no pain suggestive of claudication, no pain in feet, no leg cramps, no varicose veins, no DVT, no non-healing foot ulcer  Neuro:   no stroke, no TIA's, no seizures,  no headaches, no temporary blindness one eye,  no slurred speech, no  peripheral neuropathy, no chronic pain, no instability of gait, no memory/cognitive dysfunction  Musculoskeletal: no arthritis , no joint swelling, no myalgias, mild difficulty walking, decreased mobility   Skin:   no rash, no itching, no skin infections, no pressure sores or ulcerations  Psych:   no anxiety, no depression, no nervousness, + unusual recent stress  Eyes:   no blurry vision, no floaters, no recent vision changes  ENT:   no hearing loss, no loose or painful teeth, no dentures  Hematologic:  no easy bruising, no abnormal bleeding, no clotting disorder, no frequent epistaxis  Endocrine:  no diabetes, does not check CBG's at home     Physical Exam:   BP 111/66 mmHg  Pulse 101  Temp(Src) 97.8 F (36.6 C) (Oral)  Resp 22  Ht 5\' 9"  (1.753 m)  Wt 121.6 kg (268 lb 1.3 oz)  BMI 39.57 kg/m2  SpO2 97%  General:  Obese, pale, chronically ill-appearing  HEENT:  Unremarkable   Neck:   no JVD, no bruits, no adenopathy   Chest:   clear to auscultation, diminished breath sounds, no wheezes, no rhonchi   CV:   Distant heart sounds, RRR, no murmur   Abdomen:  Distended but soft, non-tender, no masses   Extremities:  warm, well-perfused, pulses not palpable, 4+ bilateral lower extremity edema  Rectal/GU  Deferred  Neuro:   Grossly non-focal and symmetrical throughout  Skin:   Clean and dry, no rashes, no breakdown  Diagnostic Tests:  Transthoracic Echocardiography  Patient:  Alonah, Lineback MR #:    [id number] Study Date: 12/13/2014 Gender:   F Age:    66 Height:   175.3 cm Weight:   121.1 kg BSA:    2.48 m^2 Pt. Status: Room:  ATTENDING  Magrinat, Valli Glance   Magrinat, Virgie Dad REFERRING  Magrinat, Virgie Dad SONOGRAPHER Leavy Cella PERFORMING  [facility]  cc:  ------------------------------------------------------------------- LV EF: 60% -   65%  ------------------------------------------------------------------- History:  PMH: GERD, Pleural Effusion, brain Metastsizes. Breast cancer. Risk factors: Hypertension.  ------------------------------------------------------------------- Study Conclusions  - Left ventricle: The cavity size was normal. Wall thickness was normal. Systolic function was normal. The estimated ejection fraction was in the range of 60% to 65%. Although no diagnostic regional wall motion abnormality was identified, this possibility cannot be completely excluded on the basis of this study. - Pericardium, extracardiac: A large, free-flowing pericardial effusion was identified circumferential to the heart. The fluid had no internal echoes. There was moderateright ventricular chamber collapse. There was severeright atrial chamber collapse. Features were consistent with tamponade physiology.  Transthoracic echocardiography. M-mode, limited 2D, limited spectral Doppler, and color Doppler. Birthdate: Patient birthdate: Dec 20, 1945. Age: Patient is 69 yr old. Sex: Gender: female.  BMI: 39.4 kg/m^2. Patient status: Outpatient. Study date: Study date: 12/13/2014. Study time: 01:24 PM. Location: Echo laboratory.  -------------------------------------------------------------------  ------------------------------------------------------------------- Left ventricle: The cavity size was normal. Wall thickness was normal. Systolic function was normal. The estimated ejection fraction was in the range of 60% to 65%. Although no diagnostic regional wall motion abnormality was identified, this possibility cannot be completely excluded on the basis of this study.  ------------------------------------------------------------------- Aortic valve:  Structurally normal valve.  Cusp separation was normal. Doppler: Transvalvular velocity was within the normal range. There was no  stenosis. There was no regurgitation.  ------------------------------------------------------------------- Aorta: Aortic root: The aortic root was normal in size. Ascending aorta: The ascending aorta was  normal in size.  ------------------------------------------------------------------- Mitral valve:  Structurally normal valve.  Leaflet separation was normal. Doppler: Transvalvular velocity was within the normal range. There was no evidence for stenosis. There was no regurgitation.  ------------------------------------------------------------------- Left atrium: The atrium was normal in size.  ------------------------------------------------------------------- Right ventricle: Poorly visualized. The cavity size was normal.  ------------------------------------------------------------------- Pulmonic valve:  Poorly visualized. The valve appears to be grossly normal.  ------------------------------------------------------------------- Tricuspid valve: Poorly visualized.  ------------------------------------------------------------------- Right atrium: Poorly visualized. The atrium was small.  ------------------------------------------------------------------- Pericardium: A large, free-flowing pericardial effusion was identified circumferential to the heart. The fluid had no internal echoes. Doppler: There was moderateright ventricular chamber collapse. There was severeright atrial chamber collapse. Features were consistent with tamponade physiology.  ------------------------------------------------------------------- Systemic veins: Inferior vena cava: The vessel was dilated. The respirophasic diameter changes were blunted (< 50%), consistent with elevated central venous pressure.  ------------------------------------------------------------------- Measurements  Left ventricle             Value    Reference LV ID, ED, PLAX chordal     (L)   34  mm   43 - 52 LV ID, ES, PLAX chordal        25  mm   23 - 38 LV fx shortening, PLAX chordal (L)   26  %   >=29 LV PW thickness, ED          14  mm   --------- IVS/LV PW ratio, ED          1      <=1.3  Ventricular septum           Value    Reference IVS thickness, ED           14  mm   ---------  Aorta                 Value    Reference Aortic root ID, ED           31  mm   ---------  Left atrium              Value    Reference LA ID, A-P, ES             31  mm   --------- LA ID/bsa, A-P             1.25 cm/m^2 <=2.2 LA volume, S              30.7 ml   --------- LA volume/bsa, S            12.4 ml/m^2 --------- LA volume, ES, 1-p A4C         34.3 ml   --------- LA volume/bsa, ES, 1-p A4C       13.8 ml/m^2 --------- LA volume, ES, 1-p A2C         27.3 ml   --------- LA volume/bsa, ES, 1-p A2C       11  ml/m^2 ---------  Systemic veins             Value    Reference Estimated CVP             3   mm Hg ---------  Legend: (L) and (H) mark values outside specified reference range.  ------------------------------------------------------------------- Prepared and Electronically Authenticated by  Sanda Klein, MD 2016-06-29T14:39:52   CT ANGIOGRAPHY CHEST WITH CONTRAST  TECHNIQUE: Multidetector CT imaging of the chest was performed using the standard protocol during bolus administration of intravenous contrast. Multiplanar  CT image reconstructions and MIPs were obtained to evaluate the vascular anatomy.  CONTRAST: 62mL OMNIPAQUE IOHEXOL 350 MG/ML SOLN  COMPARISON: Plain film 12/13/2014. Chest CT of 09/11/2014  FINDINGS: Mediastinum/Nodes: The quality of this exam for  evaluation of pulmonary embolism is sufficient. Relatively small volume but definite pulmonary emboli. Examples within a right upper lobe segmental branch in image 76 of series 6 and a right lower lobe subsegmental branch on image 157 of series 6.  No supraclavicular adenopathy. A right Port-A-Cath which terminates at the mid to high at right atrium. Normal heart size. Moderate to large pericardial effusion, increased. Multivessel coronary artery atherosclerosis. Lad coronary artery atherosclerosis on image 44. Elevated right heart pressures, with reflux of contrast into the IVC and hepatic veins.  Resolution of previously described mediastinal adenopathy. No hilar adenopathy. No internal mammary adenopathy. Left axillary node dissection, without axillary adenopathy. Bilateral mastectomy.  Lungs/Pleura: Moderate left and small right pleural effusions, both enlarged since the prior CT. Motion and patient body habitus degradation. Right base subsegmental atelectasis.  Subpleural radiation fibrosis within the left upper lobe. Collapse/consolidative change in the left lower lobe. Improved since 09/11/2014.  Upper abdomen: Normal imaged portions of the liver, spleen, stomach, adrenal glands. Trace perihepatic ascites.  Musculoskeletal: New heterogeneous sclerosis, consistent with osseous metastasis.  Review of the MIP images confirms the above findings.  IMPRESSION: 1. Small volume pulmonary emboli. These results were called by telephone at the time of interpretation on 12/13/2014 at 6:09 pm to covering nurse, Melvenia Needles, who verbally acknowledged these results. 2. Enlargement of bilateral pleural effusions and a now large pericardial effusion. suggestion of elevated right heart pressures. 3. Atherosclerosis, including within the coronary arteries. 4. Left base aeration which is improved. Residual collapse/consolidative changes could represent atelectasis and/or infection. 5.  Resolution of mediastinal and hilar adenopathy. 6. Development of osseous metastasis. 7. Trace perihepatic ascites.   Electronically Signed  By: Abigail Miyamoto M.D.  On: 12/13/2014 18:13    Impression:  Patient has widely metastatic breast cancer with known malignant left pleural effusion who now presents with enlarging pericardial effusion without hemodynamic signs of pericardial tamponade but early RV diastolic compression on echocardiogram consistent with early pericardial tamponade.  CT angiogram of the chest confirms the presence of the large pericardial effusion as well as bilateral pleural effusions, left greater than right.  In addition, the patient has numerous small pulmonary emboli in both lungs and ascites.    Plan:  I have discussed the implications of these findings at length with the patient and her son. We discussed the indications, risks, and potential benefits of subxiphoid pericardial window to drain the patient's pericardial effusion. We also discussed the potential benefits of bilateral chest tube placement to drain the patient's pleural effusions. The implications regarding the presence of numerous small pulmonary emboli was discussed. Implications regarding the patient's long-term prognosis were discussed. The patient specifically requests an aggressive approach to her care at this point in time.  The patient understands and accepts all potential associated risks of surgery including but not limited to death, stroke, massive pulmonary embolus, bleeding, arrhythmia, recurrence of either pleural or pericardial effusions, pneumonia, respiratory failure, wound infection or incisional hernia.  All of their questions nave been addressed.  We will start intravenous heparin without a bolus. Heparin will need to be temporarily on hold during the patient's surgical procedure. Depending upon operative findings (whether or not her pericardial effusion is hemorrhagic) IVC filter  placement could be considered at a later time.  I spent in excess of 60 minutes during the conduct of this hospital consultation and >50% of this time involved direct face-to-face encounter for counseling and/or coordination of the patient's care.    Valentina Gu. Roxy Manns, MD 12/13/2014 6:40 PM

## 2014-12-13 NOTE — H&P (Signed)
Cardiology History and Physical    Referring Physician: None Primary Physician: Leeanne Rio, PA-C Primary Cardiologist: Dr Haroldine Laws Reason for Consultation: Pericardial Effusion  HPI: Linda [name] is a 69 year-old [name] woman with metastatic breast CA status post bilateral mastectomies 8/13. Post-mastectomy radiation completed 04/15/2012  She had cyclophosphamide and doxorubicin in dose dense fashion x4, to be followed by paclitaxel and trastuzumab again every 2 weeks x4, with the trastuzumab continued, but completed 11/01/2012.  Started anastrazole 04/16/2012; normal dexa scan 05/03/2012 at the [facility]. Still on anastrazole.  Hospitalized 09/10/2014>>09/12/2014 with pleural effusion, s/p thoracentesis w/ 900 cc out. CTA showed pericardial effusion. Effusion (NZB [id number]) showed malignant cells which were HER-2 positive  To start trastuzumab/pertuzumab 09/26/2014, to be repeated every 21 days indefinitely.   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 Started whole brain radiation on 10/09/14. On Decadron.   Admitted 06/24>>06/25 w/ SOB, s/p thoracentesis w/ 750 cc out, L arm edema, but no DVT on [place].  06/29 een by Dr Jana Hakim and was SOB, increased LUE edema, weight gain, constipation, eye drainage.   Came to hospital today for scheduled f/u echo. Large pericardial effusion seen and cardiology asked to admit.   Pt describes orthopnea, but denies PND. She has gained 36 lbs in the last 5-6 weeks. She coughs when she tries to lie down. She cannot walk very far without stopping due to fatigue and SOB. She is a little SOB with conversation. She is unable to do much around the house because of the symptoms.   Echos: 08/20/11 EF 60-65% Lateral S' 8.5 mod LVH  12/04/11 EF 60-65% lateral s' 7.9 Mild LVH (poor window) 04/22/12 EF 55-60% lateral s' 7.0 (poor window) 09/11/2014 Nl LVF, grade 1 DD, small  effusion, prominent epicardial fat (cannot r/o mass in pericardial space) rec cMRI to assess. 12/13/2014 EF 55-60%, lg peric effusion, + tamponade, results below.  Full ROS is otherwise negative except for decreased urination (once daily) for the last several days, and constipation.   Review of Systems:   Cardiac Review of Systems: {Y] = yes _0  = no Chest Pain _1  Resting SOB [ y ]Exertional SOB [ y] Orthopnea [ y ] Pedal Edema [ y ] Palpitations _2 Syncope _3  Presyncope _4  General Review of Systems: [Y] = yes _5 =no Constitional: recent weight change [ y ]; anorexia _6 ; fatigue _7 ; nausea _8 ; night sweats _9 ; fever _10 ; or chills _11 ;   Dental: poor dentition_12 ;  Eye : blurred vision _13 ; diplopia _14 ; vision changes _15 ; Amaurosis fugax_16 ; Resp: cough Blue.Reese ]; wheezing_17 ; hemoptysis_18 ; shortness of breath_19 ; paroxysmal nocturnal dyspnea_20 ; dyspnea on exertion[y ]; or orthopnea[y ];  GI: gallstones_21 , vomiting_22 ; dysphagia_23 ; melena_24 ; hematochezia _25 ; heartburn_26 ; Hx of Colonoscopy_27 ; GU: kidney stones _28 ; hematuria_29 ; dysuria _30 ; nocturia_31 ; history of obstruction _32 ;  Skin: rash, swelling[ y ];, hair loss_33 ; peripheral edema[y]; or itching_34 ; Musculosketetal: myalgias_35 ; joint swelling_36 ; joint erythema_37 ; joint pain_38 ; back pain_39 ; Heme/Lymph: bruising_40 ; bleeding_41 ; anemia_42 ;  Neuro: TIA_43 ; headaches_44 ; stroke_45 ; vertigo_46 ; seizures_47 ; paresthesias_48 ; difficulty walking_49 ; Psych:depression_50 ; anxiety_51 ; Endocrine: diabetes_52 ; thyroid dysfunction[  ]; Immunizations: Flu _53 ; Pneumococcal_54 ; Other:  Past Medical History  Diagnosis Date  . Cancer 08-13-11  07-31-11-Dx. Bilarteral Breast cancer-left greater than rt.  . Hematuria, undiagnosed cause 08-13-11    Being evaluated by [facility] 08-14-11  . Hypertension   . Bronchitis     hx  . GERD (gastroesophageal reflux disease)     doing well  . Breast cancer 07/30/11 dx    Right invasive ductal ca 7 0'clock,& left breast=invasive ductal ca and dcis, left axilla nlymph node, metastatic ca  . Seroma 02/04/12    right breast 200cc removed erythema on right side  . Seasonal allergies   . History of radiation therapy 02/20/12-04/15/12    left breast,total 60.4 Gy  . Wears partial dentures     wears upper and lower partial  . Allergy   . Blood transfusion without reported diagnosis   . 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   . Cancer of central portion of left female breast 08/01/2011  . S/P radiation therapy 10/09/14-10/27/14    whole brain 37.5Gy/77f   Past Surgical History  Procedure Laterality Date  . Child birth  08-13-11    x3 -NVD  . Portacath placement  08/15/2011    Procedure: INSERTION PORT-A-CATH; Surgeon: DHarl Bowie MD; Location: [facility]; Service: General; Laterality: N/A;  . Mastectomy w/ sentinel node biopsy  01/21/2012    Procedure: MASTECTOMY WITH SENTINEL LYMPH NODE BIOPSY; Surgeon: DHarl Bowie MD; Location: [facility] Service: General; Laterality: Bilateral; Left modified radical mastectomy, Rt mastectomy with Sentinel lymphnode biospy  . Breast surgery    . Port-a-cath removal Right 12/22/2012    Procedure: REMOVAL PORT-A-CATH; Surgeon: DHarl Bowie MD; Location: [facility] Service: General; Laterality: Right;     Medication Sig  anastrozole (ARIMIDEX) 1 MG tablet TAKE 1 TABLET BY MOUTH DAILY.  b complex vitamins tablet Take 1 tablet by mouth daily.  benzonatate (TESSALON) 100 MG capsule Take 1 capsule (100 mg total) by mouth 3 (three) times daily as needed for cough.  calcium-vitamin D (OSCAL WITH D) 500-200 MG-UNIT per tablet Take 1 tablet by mouth 2 (two) times daily.  cholecalciferol (VITAMIN D) 1000 UNITS tablet Take 1 tablet (1,000 Units total) by mouth daily.  furosemide (LASIX) 40 MG tablet Take 1 tablet (40 mg total) by mouth daily.  HYDROcodone-homatropine (HYCODAN) 5-1.5 MG/5ML syrup Take 5 mLs by mouth every 6 (six) hours as needed for cough.  lidocaine-prilocaine (EMLA) cream Apply 1 application topically as needed. Patient taking differently: Apply 1 application topically as needed (pain, irritation).   Multiple Vitamins-Iron (MULTIVITAMINS WITH IRON) TABS Take 1 tablet by mouth daily.  prochlorperazine (COMPAZINE) 10 MG tablet Take 1 tablet (10 mg total) by mouth every 6 (six) hours as needed (Nausea or vomiting).  tobramycin-dexamethasone (TOBRADEX) ophthalmic solution Place 1 drop into the left eye every 4 (four) hours while awake.    Allergies  Allergen Reactions  . Ace Inhibitors Cough    History   Social History  . Marital Status: Single    Spouse Name: N/A  . Number of Children: 3  . Years of Education: N/A   Occupational History  . Retired    Social History Main Topics  . Smoking status: Never Smoker   . Smokeless tobacco: Never Used  . Alcohol Use: 0.0 oz/week    0 Standard drinks or equivalent per week     Comment: rare- occ.  . Drug Use: No  . Sexual Activity: No     Comment: gxp3, 1st child age 69 menopause mid  40's,no hrt   Other Topics Concern  . Not on file   Social History Narrative   Lives with son. Single. She works Administrator, sports for [employer].  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 She moved to [place] about 2 years ago but has a Games developer in [place]. Son Gerald Stabs lives in [place] and works as a Airline pilot. His wife is a Marine scientist. Son Shanon Brow lives at [place] and is a Advertising account executive in addition to having the [employer]. The patient attends a local [employer] here.    Family History  Problem Relation Age of Onset  . Heart disease Mother   . Cancer Mother 60    breast, , 66 deceased  . Heart attack Father   . Cancer Sister 52    breast  . Colon cancer Neg Hx    Family Status  Relation Status Death Age  . Mother Deceased 50  . Father Deceased 61    MI  . Sister Alive     PHYSICAL EXAM: General:Fatigued appearing No respiratory difficulty at rest. Very SOB with exertion HEENT: alopecic Neck: supple. JVD difficult to assess 2nd to body habitus. Carotids 2+ bilat; no bruits. No lymphadenopathy or thryomegaly appreciated. Cor: PMI nonpalpable. Distant HS. Regular rate & rhythm. No rubs, gallops, no murmurs. Lungs: rales Abdomen: soft, nontender, nondistended. No hepatosplenomegaly. No bruits or masses. Good bowel sounds. Extremities: no cyanosis, clubbing, rash, 3+ edema L>R Neuro: alert & oriented x 3, cranial nerves grossly intact. moves all 4 extremities w/o difficulty. Affect pleasant.  ECG: ECHO: 12/13/2014 Study Conclusions - Left ventricle: The cavity size was normal. Wall thickness was normal. Systolic function was normal. The estimated ejection fraction was in the range of 60% to 65%. Although no diagnostic regional wall motion abnormality was identified, this possibility cannot be completely excluded on the basis of this study. - Pericardium, extracardiac: A large, free-flowing pericardial effusion was identified circumferential to the heart. The fluid had  no internal echoes. There was moderateright ventricular chamber collapse. There was severeright atrial chamber collapse. Features were consistent with tamponade physiology.   Lab Results Last 24 Hours    Results for orders placed or performed in visit on 12/13/14 (from the past 24 hour(s))  CBC with Differential Status: Abnormal   Collection Time: 12/13/14 9:23 AM  Result Value Ref Range   WBC 8.3 3.9 - 10.3 10e3/uL   NEUT# 6.2 1.5 - 6.5 10e3/uL   HGB 10.7 (L) 11.6 - 15.9 g/dL   HCT 32.8 (L) 34.8 - 46.6 %   Platelets 265 145 - 400 10e3/uL   MCV 92.4 79.5 - 101.0 fL   MCH 30.1 25.1 - 34.0 pg   MCHC 32.6 31.5 - 36.0 g/dL   RBC 3.55 (L) 3.70 - 5.45 10e6/uL   RDW 17.2 (H) 11.2 - 14.5 %   lymph# 1.1 0.9 - 3.3 10e3/uL   MONO# 0.8 0.1 - 0.9 10e3/uL   Eosinophils Absolute 0.1 0.0 - 0.5 10e3/uL   Basophils Absolute 0.1 0.0 - 0.1 10e3/uL   NEUT% 74.8 38.4 - 76.8 %   LYMPH% 13.6 (L) 14.0 - 49.7 %   MONO% 10.0 0.0 - 14.0 %   EOS% 0.9 0.0 - 7.0 %   BASO% 0.7 0.0 - 2.0 %   Narrative   Performed At: [facility] [address]  [place], [place] [address]  Comprehensive metabolic panel Status:  Abnormal   Collection Time: 12/13/14 9:24 AM  Result Value Ref Range   Sodium 137 136 - 145 mEq/L   Potassium 4.2 3.5 - 5.1 mEq/L   Chloride 106 98 - 109 mEq/L   CO2 21 (L) 22 - 29 mEq/L   Glucose 127 70 - 140 mg/dl   BUN 32.1 (H) 7.0 - 26.0 mg/dL   Creatinine 1.1 0.6 - 1.1 mg/dL   Total Bilirubin 0.51 0.20 - 1.20 mg/dL   Alkaline Phosphatase 79 40 - 150 U/L   AST 20 5 - 34 U/L   ALT 27 0 - 55 U/L   Total Protein 6.6 6.4 - 8.3 g/dL   Albumin 3.4 (L) 3.5 - 5.0 g/dL   Calcium 9.2 8.4 - 10.4 mg/dL   Anion Gap 10 3 - 11 mEq/L   EGFR 51 (L) >90 ml/min/1.73 m2   Narrative   Performed At:  [facility] [address]  [place], [place] [address]     Radiology:   Dg Chest 1 View 12/09/2014   CLINICAL DATA:  Pleural effusion.  Post left-sided thoracentesis.  EXAM: CHEST  1 VIEW  COMPARISON:  Chest radiograph- 12/08/2014  FINDINGS: Interval reduction in persistent small left-sided effusion post thoracentesis. No pneumothorax.  Grossly unchanged enlarged cardiac silhouette and mediastinal contours given persistently reduced lung volumes. Stable position of support apparatus.  Improved aeration of the left lung base with persistent bibasilar opacities, left greater than right. No new focal airspace opacities.  Surgical clips overlie the left axilla. Osseous structures appear normal.  IMPRESSION: 1. Interval reduction in persistent small left-sided effusion post thoracentesis. No pneumothorax. 2. Improved aeration the left lung base with residual bibasilar opacities, left greater than right, likely atelectasis.   Electronically Signed   By: Sandi Mariscal M.D.   On: 12/09/2014 11:39   US Thoracentesis Asp Pleural Space W/img Guide 12/09/2014   INDICATION: Symptomatic left-sided sided pleural effusion  EXAM: US THORACENTESIS ASP PLEURAL SPACE W/IMG GUIDE  COMPARISON:  None.  MEDICATIONS: 10 cc 1% lidocaine  COMPLICATIONS: None immediate  TECHNIQUE: Informed written consent was obtained from the patient after a discussion of the risks, benefits and alternatives to treatment. A timeout was performed prior to the initiation of the procedure.  Initial ultrasound scanning demonstrates a left pleural effusion. The lower chest was prepped and draped in the usual sterile fashion. 1% lidocaine was used for local anesthesia. Under direct ultrasound guidance, a 19 gauge, 7-cm, Yueh catheter was introduced. An ultrasound image was saved for documentation purposes. The thoracentesis was performed. The catheter was removed and a dressing was applied. The patient tolerated the  procedure well without immediate post procedural complication. The patient was escorted to have an upright chest radiograph.  FINDINGS: A total of approximately 750 cc of yellow fluid was removed. Requested samples were sent to the laboratory.  IMPRESSION: Successful ultrasound-guided L sided thoracentesis yielding 750 cc of pleural fluid.  Read by:  Lavonia Drafts [facility]   Electronically Signed   By: Sandi Mariscal M.D.   On: 12/09/2014 11:30          ASSESSMENT: Active Problems: Principal Problem:   Pericardial effusion with cardiac tamponade Active Problems:   Cough   Hypertension   Malignant pleural effusion   Bilateral breast cancer   Brain metastases   Anasarca   Left upper extremity swelling   Peripheral edema  PLAN/DISCUSSION: Ms Vanorman is being admitted for management of her pericardial effusion, surgeons to see. She  will also need diuresis. Will discuss plan with MD. With all the edema and an albumin of 3.4, MD advise on assessment for thrombus. Will use SCD's only for VTE prophylaxis for now.  Lenoard Aden 12/13/2014 3:31 PM Beeper [phone]        Patient seen and examined with Rosaria Ferries, PA-C. We discussed all aspects of the encounter. I agree with the assessment and plan as stated above.  She has metastatic breast CA. Now with significant volume overload with massive pericardial effusion - this is likely malignant. Will admit to SDU. Have consulted TCTS for window. Would hold diuretics until effusion drained than can proceed with diuresis. D/w patient and Dr. Jana Hakim.  Bensimhon, Daniel,MD 5:21 PM

## 2014-12-13 NOTE — Progress Notes (Signed)
Radiologist called to notify that pt has PE. Paged Rosaria Ferries, PA. Waiting for page back.

## 2014-12-14 ENCOUNTER — Encounter (HOSPITAL_COMMUNITY)
Admission: AD | Disposition: A | Discharge status: Hospice | Payer: Self-pay | Source: Ambulatory Visit | Attending: Internal Medicine

## 2014-12-14 ENCOUNTER — Inpatient Hospital Stay (HOSPITAL_COMMUNITY): Payer: Medicare Other | Admitting: Certified Registered Nurse Anesthetist

## 2014-12-14 ENCOUNTER — Inpatient Hospital Stay (HOSPITAL_COMMUNITY): Payer: Medicare Other

## 2014-12-14 ENCOUNTER — Encounter (HOSPITAL_COMMUNITY): Payer: Self-pay | Admitting: Certified Registered Nurse Anesthetist

## 2014-12-14 DIAGNOSIS — I2699 Other pulmonary embolism without acute cor pulmonale: Secondary | ICD-10-CM

## 2014-12-14 DIAGNOSIS — I319 Disease of pericardium, unspecified: Secondary | ICD-10-CM

## 2014-12-14 DIAGNOSIS — I2782 Chronic pulmonary embolism: Secondary | ICD-10-CM

## 2014-12-14 HISTORY — PX: TEE WITHOUT CARDIOVERSION: SHX5443

## 2014-12-14 HISTORY — PX: CHEST TUBE INSERTION: SHX231

## 2014-12-14 HISTORY — PX: SUBXYPHOID PERICARDIAL WINDOW: SHX5075

## 2014-12-14 LAB — COMPREHENSIVE METABOLIC PANEL
ALBUMIN: 3.4 g/dL — AB (ref 3.5–5.0)
ALK PHOS: 70 U/L (ref 38–126)
ALT: 30 U/L (ref 14–54)
AST: 31 U/L (ref 15–41)
Anion gap: 9 (ref 5–15)
BUN: 34 mg/dL — ABNORMAL HIGH (ref 6–20)
CALCIUM: 9 mg/dL (ref 8.9–10.3)
CO2: 24 mmol/L (ref 22–32)
CREATININE: 1.12 mg/dL — AB (ref 0.44–1.00)
Chloride: 102 mmol/L (ref 101–111)
GFR calc Af Amer: 57 mL/min — ABNORMAL LOW (ref >60)
GFR calc non Af Amer: 49 mL/min — ABNORMAL LOW (ref >60)
GLUCOSE: 140 mg/dL — AB (ref 65–99)
Potassium: 4.1 mmol/L (ref 3.5–5.1)
SODIUM: 135 mmol/L (ref 135–145)
Total Bilirubin: 0.5 mg/dL (ref 0.3–1.2)
Total Protein: 6.9 g/dL (ref 6.5–8.1)

## 2014-12-14 LAB — BODY FLUID CELL COUNT WITH DIFFERENTIAL
Eos, Fluid: 1 %
LYMPHS FL: 23 %
Monocyte-Macrophage-Serous Fluid: 7 % — ABNORMAL LOW (ref 50–90)
Neutrophil Count, Fluid: 69 % — ABNORMAL HIGH (ref 0–25)
WBC FLUID: 4778 uL — AB (ref 0–1000)

## 2014-12-14 LAB — GRAM STAIN

## 2014-12-14 SURGERY — CREATION, PERICARDIAL WINDOW, SUBXIPHOID APPROACH
Anesthesia: General | Site: Chest

## 2014-12-14 MED ORDER — PROPOFOL 10 MG/ML IV BOLUS
INTRAVENOUS | Status: AC
  Filled 2014-12-14: qty 20

## 2014-12-14 MED ORDER — GLYCOPYRROLATE 0.2 MG/ML IJ SOLN
INTRAMUSCULAR | Status: AC
  Filled 2014-12-14: qty 1

## 2014-12-14 MED ORDER — MIDAZOLAM HCL 2 MG/2ML IJ SOLN
INTRAMUSCULAR | Status: AC
  Filled 2014-12-14: qty 2

## 2014-12-14 MED ORDER — FUROSEMIDE 10 MG/ML IJ SOLN
40.0000 mg | Freq: Two times a day (BID) | INTRAMUSCULAR | Status: DC
  Administered 2014-12-14 – 2014-12-18 (×9): 40 mg via INTRAVENOUS
  Filled 2014-12-14 (×13): qty 4

## 2014-12-14 MED ORDER — FENTANYL CITRATE (PF) 250 MCG/5ML IJ SOLN
INTRAMUSCULAR | Status: AC
  Filled 2014-12-14: qty 5

## 2014-12-14 MED ORDER — ACETAMINOPHEN 160 MG/5ML PO SOLN
1000.0000 mg | Freq: Four times a day (QID) | ORAL | Status: AC
  Administered 2014-12-17 – 2014-12-18 (×5): 1000 mg via ORAL
  Filled 2014-12-14 (×20): qty 40

## 2014-12-14 MED ORDER — FENTANYL CITRATE (PF) 100 MCG/2ML IJ SOLN
INTRAMUSCULAR | Status: AC
  Filled 2014-12-14: qty 2

## 2014-12-14 MED ORDER — FENTANYL CITRATE (PF) 100 MCG/2ML IJ SOLN
25.0000 ug | INTRAMUSCULAR | Status: DC | PRN
  Administered 2014-12-14 (×4): 25 ug via INTRAVENOUS

## 2014-12-14 MED ORDER — ONDANSETRON HCL 4 MG/2ML IJ SOLN
INTRAMUSCULAR | Status: AC
  Filled 2014-12-14: qty 2

## 2014-12-14 MED ORDER — MIDAZOLAM HCL 5 MG/5ML IJ SOLN
INTRAMUSCULAR | Status: DC | PRN
  Administered 2014-12-14 (×2): 1 mg via INTRAVENOUS

## 2014-12-14 MED ORDER — TRAMADOL HCL 50 MG PO TABS
50.0000 mg | ORAL_TABLET | Freq: Four times a day (QID) | ORAL | Status: DC | PRN
  Administered 2014-12-21: 100 mg via ORAL
  Filled 2014-12-14: qty 2

## 2014-12-14 MED ORDER — POTASSIUM CHLORIDE 10 MEQ/50ML IV SOLN: 10.0000 meq | Freq: Every day | INTRAVENOUS | Status: DC | PRN

## 2014-12-14 MED ORDER — SENNOSIDES-DOCUSATE SODIUM 8.6-50 MG PO TABS
1.0000 | ORAL_TABLET | Freq: Every day | ORAL | Status: DC
  Administered 2014-12-14: 1 via ORAL
  Filled 2014-12-14 (×2): qty 1

## 2014-12-14 MED ORDER — DEXTROSE 5 % IV SOLN
1.5000 g | Freq: Two times a day (BID) | INTRAVENOUS | Status: AC
  Administered 2014-12-14 – 2014-12-15 (×2): 1.5 g via INTRAVENOUS
  Filled 2014-12-14 (×2): qty 1.5

## 2014-12-14 MED ORDER — LACTATED RINGERS IV SOLN
INTRAVENOUS | Status: DC | PRN
  Administered 2014-12-14: 08:00:00 via INTRAVENOUS

## 2014-12-14 MED ORDER — ROCURONIUM BROMIDE 100 MG/10ML IV SOLN
INTRAVENOUS | Status: DC | PRN
  Administered 2014-12-14: 20 mg via INTRAVENOUS
  Administered 2014-12-14: 30 mg via INTRAVENOUS

## 2014-12-14 MED ORDER — OXYCODONE HCL 5 MG PO TABS
ORAL_TABLET | ORAL | Status: AC
  Filled 2014-12-14: qty 1

## 2014-12-14 MED ORDER — FENTANYL CITRATE (PF) 100 MCG/2ML IJ SOLN
INTRAMUSCULAR | Status: DC | PRN
  Administered 2014-12-14: 25 ug via INTRAVENOUS
  Administered 2014-12-14: 50 ug via INTRAVENOUS
  Administered 2014-12-14: 25 ug via INTRAVENOUS
  Administered 2014-12-14: 100 ug via INTRAVENOUS
  Administered 2014-12-14 (×2): 25 ug via INTRAVENOUS

## 2014-12-14 MED ORDER — LIDOCAINE HCL (CARDIAC) 20 MG/ML IV SOLN
INTRAVENOUS | Status: AC
  Filled 2014-12-14: qty 5

## 2014-12-14 MED ORDER — 0.9 % SODIUM CHLORIDE (POUR BTL) OPTIME
TOPICAL | Status: DC | PRN
  Administered 2014-12-14: 2000 mL

## 2014-12-14 MED ORDER — EPINEPHRINE HCL 1 MG/ML IJ SOLN
0.5000 ug/min | INTRAVENOUS | Status: DC
  Filled 2014-12-14: qty 4

## 2014-12-14 MED ORDER — ONDANSETRON HCL 4 MG/2ML IJ SOLN
4.0000 mg | Freq: Four times a day (QID) | INTRAMUSCULAR | Status: DC | PRN
  Administered 2014-12-15: 4 mg via INTRAVENOUS
  Filled 2014-12-14: qty 2

## 2014-12-14 MED ORDER — LIDOCAINE HCL 4 % MT SOLN
OROMUCOSAL | Status: DC | PRN
  Administered 2014-12-14: 4 mL via TOPICAL

## 2014-12-14 MED ORDER — ACETAMINOPHEN 500 MG PO TABS
1000.0000 mg | ORAL_TABLET | Freq: Four times a day (QID) | ORAL | Status: AC
  Administered 2014-12-14 – 2014-12-19 (×12): 1000 mg via ORAL
  Filled 2014-12-14 (×24): qty 2

## 2014-12-14 MED ORDER — ETOMIDATE 2 MG/ML IV SOLN
INTRAVENOUS | Status: DC | PRN
  Administered 2014-12-14: 18 mg via INTRAVENOUS

## 2014-12-14 MED ORDER — HEPARIN SOD (PORK) LOCK FLUSH 100 UNIT/ML IV SOLN
500.0000 [IU] | INTRAVENOUS | Status: AC | PRN
  Administered 2014-12-14: 500 [IU]

## 2014-12-14 MED ORDER — OXYCODONE HCL 5 MG PO TABS
5.0000 mg | ORAL_TABLET | Freq: Once | ORAL | Status: AC | PRN
  Administered 2014-12-14: 5 mg via ORAL

## 2014-12-14 MED ORDER — LACTATED RINGERS IV SOLN
INTRAVENOUS | Status: DC | PRN
  Administered 2014-12-14: 07:00:00 via INTRAVENOUS

## 2014-12-14 MED ORDER — ONDANSETRON HCL 4 MG/2ML IJ SOLN: 4.0000 mg | Freq: Once | INTRAMUSCULAR | Status: DC | PRN

## 2014-12-14 MED ORDER — DEXTROSE 5 % IV SOLN: 10.0000 mg | INTRAVENOUS | Status: DC | PRN

## 2014-12-14 MED ORDER — VECURONIUM BROMIDE 10 MG IV SOLR
INTRAVENOUS | Status: DC | PRN
  Administered 2014-12-14: 2 mg via INTRAVENOUS

## 2014-12-14 MED ORDER — OXYCODONE HCL 5 MG/5ML PO SOLN: 5.0000 mg | Freq: Once | ORAL | Status: AC | PRN

## 2014-12-14 MED ORDER — ONDANSETRON HCL 4 MG/2ML IJ SOLN
INTRAMUSCULAR | Status: DC | PRN
  Administered 2014-12-14: 4 mg via INTRAVENOUS

## 2014-12-14 MED ORDER — KCL IN DEXTROSE-NACL 20-5-0.45 MEQ/L-%-% IV SOLN
INTRAVENOUS | Status: DC
  Administered 2014-12-14 – 2014-12-15 (×3): via INTRAVENOUS
  Filled 2014-12-14 (×9): qty 1000

## 2014-12-14 MED ORDER — SODIUM CHLORIDE 0.9 % IJ SOLN
INTRAMUSCULAR | Status: AC
  Filled 2014-12-14: qty 10

## 2014-12-14 MED ORDER — NEOSTIGMINE METHYLSULFATE 10 MG/10ML IV SOLN
INTRAVENOUS | Status: DC | PRN
  Administered 2014-12-14: 5 mg via INTRAVENOUS

## 2014-12-14 MED ORDER — SUCCINYLCHOLINE CHLORIDE 20 MG/ML IJ SOLN
INTRAMUSCULAR | Status: AC
  Filled 2014-12-14: qty 1

## 2014-12-14 MED ORDER — BISACODYL 5 MG PO TBEC
10.0000 mg | DELAYED_RELEASE_TABLET | Freq: Every day | ORAL | Status: DC
Start: 2014-12-14 — End: 2014-12-22
  Administered 2014-12-15 – 2014-12-22 (×8): 10 mg via ORAL
  Filled 2014-12-14 (×8): qty 2

## 2014-12-14 MED ORDER — SUCCINYLCHOLINE CHLORIDE 20 MG/ML IJ SOLN
INTRAMUSCULAR | Status: DC | PRN
  Administered 2014-12-14: 100 mg via INTRAVENOUS

## 2014-12-14 MED ORDER — POTASSIUM CHLORIDE ER 10 MEQ PO TBCR
20.0000 meq | EXTENDED_RELEASE_TABLET | Freq: Two times a day (BID) | ORAL | Status: DC
  Administered 2014-12-14 – 2014-12-17 (×8): 20 meq via ORAL
  Filled 2014-12-14 (×11): qty 2

## 2014-12-14 MED ORDER — GLYCOPYRROLATE 0.2 MG/ML IJ SOLN
INTRAMUSCULAR | Status: DC | PRN
  Administered 2014-12-14: .8 mg via INTRAVENOUS

## 2014-12-14 MED ORDER — FENTANYL CITRATE (PF) 100 MCG/2ML IJ SOLN: 25.0000 ug | INTRAMUSCULAR | Status: DC | PRN

## 2014-12-14 MED ORDER — OXYCODONE HCL 5 MG PO TABS
5.0000 mg | ORAL_TABLET | ORAL | Status: DC | PRN
  Administered 2014-12-14 – 2014-12-15 (×3): 10 mg via ORAL
  Administered 2014-12-15 (×2): 5 mg via ORAL
  Administered 2014-12-19: 10 mg via ORAL
  Filled 2014-12-14: qty 2
  Filled 2014-12-14: qty 1
  Filled 2014-12-14 (×3): qty 2
  Filled 2014-12-14: qty 1

## 2014-12-14 MED ORDER — FENTANYL CITRATE (PF) 100 MCG/2ML IJ SOLN
25.0000 ug | INTRAMUSCULAR | Status: DC | PRN
  Administered 2014-12-14 (×2): 50 ug via INTRAVENOUS
  Administered 2014-12-14: 25 ug via INTRAVENOUS
  Administered 2014-12-14: 50 ug via INTRAVENOUS
  Administered 2014-12-14: 25 ug via INTRAVENOUS
  Administered 2014-12-15 (×3): 50 ug via INTRAVENOUS
  Administered 2014-12-16: 25 ug via INTRAVENOUS
  Administered 2014-12-16: 50 ug via INTRAVENOUS
  Administered 2014-12-16: 25 ug via INTRAVENOUS
  Administered 2014-12-17 – 2014-12-19 (×6): 50 ug via INTRAVENOUS
  Filled 2014-12-14 (×16): qty 2

## 2014-12-14 MED ORDER — PHENYLEPHRINE HCL 10 MG/ML IJ SOLN
10.0000 mg | INTRAVENOUS | Status: DC | PRN
  Administered 2014-12-14: 10 ug/min via INTRAVENOUS

## 2014-12-14 MED ORDER — EPHEDRINE SULFATE 50 MG/ML IJ SOLN
INTRAMUSCULAR | Status: AC
  Filled 2014-12-14: qty 1

## 2014-12-14 SURGICAL SUPPLY — 62 items
ATTRACTOMAT 16X20 MAGNETIC DRP (DRAPES) ×4 IMPLANT
BENZOIN TINCTURE PRP APPL 2/3 (GAUZE/BANDAGES/DRESSINGS) ×4 IMPLANT
BLADE STERNUM SYSTEM 6 (BLADE) ×4 IMPLANT
BLADE SURG 11 STRL SS (BLADE) ×4 IMPLANT
CANISTER SUCTION 2500CC (MISCELLANEOUS) ×4 IMPLANT
CATH THORACIC 28FR (CATHETERS) IMPLANT
CATH THORACIC 28FR RT ANG (CATHETERS) IMPLANT
CATH THORACIC 36FR (CATHETERS) IMPLANT
CATH THORACIC 36FR RT ANG (CATHETERS) IMPLANT
CLEANER TIP ELECTROSURG 2X2 (MISCELLANEOUS) ×4 IMPLANT
CLIP TI MEDIUM 24 (CLIP) ×4 IMPLANT
CLIP TI WIDE RED SMALL 24 (CLIP) ×4 IMPLANT
CONN ST 1/4X3/8  BEN (MISCELLANEOUS) ×1
CONN ST 1/4X3/8 BEN (MISCELLANEOUS) ×3 IMPLANT
CONT SPEC 4OZ CLIKSEAL STRL BL (MISCELLANEOUS) IMPLANT
COVER SURGICAL LIGHT HANDLE (MISCELLANEOUS) ×8 IMPLANT
DRAIN CHANNEL 28F RND 3/8 FF (WOUND CARE) ×8 IMPLANT
DRAIN CHANNEL 32F RND 10.7 FF (WOUND CARE) ×4 IMPLANT
DRAPE LAPAROSCOPIC ABDOMINAL (DRAPES) ×4 IMPLANT
ELECT BLADE 4.0 EZ CLEAN MEGAD (MISCELLANEOUS) ×4
ELECT BLADE 6.5 EXT (BLADE) ×4 IMPLANT
ELECT REM PT RETURN 9FT ADLT (ELECTROSURGICAL) ×4
ELECTRODE BLDE 4.0 EZ CLN MEGD (MISCELLANEOUS) ×3 IMPLANT
ELECTRODE REM PT RTRN 9FT ADLT (ELECTROSURGICAL) ×3 IMPLANT
GAUZE SPONGE 4X4 12PLY STRL (GAUZE/BANDAGES/DRESSINGS) IMPLANT
GLOVE BIO SURGEON STRL SZ 6 (GLOVE) ×4 IMPLANT
GLOVE BIO SURGEON STRL SZ 6.5 (GLOVE) ×8 IMPLANT
GLOVE BIOGEL PI IND STRL 6 (GLOVE) ×3 IMPLANT
GLOVE BIOGEL PI INDICATOR 6 (GLOVE) ×1
GLOVE ORTHO TXT STRL SZ7.5 (GLOVE) ×8 IMPLANT
GOWN STRL REUS W/ TWL XL LVL3 (GOWN DISPOSABLE) ×3 IMPLANT
GOWN STRL REUS W/TWL XL LVL3 (GOWN DISPOSABLE) ×1
HEMOSTAT POWDER SURGIFOAM 1G (HEMOSTASIS) IMPLANT
KIT BASIN OR (CUSTOM PROCEDURE TRAY) ×4 IMPLANT
KIT ROOM TURNOVER OR (KITS) ×4 IMPLANT
LIQUID BAND (GAUZE/BANDAGES/DRESSINGS) ×4 IMPLANT
NS IRRIG 1000ML POUR BTL (IV SOLUTION) ×8 IMPLANT
PACK CHEST (CUSTOM PROCEDURE TRAY) ×4 IMPLANT
PAD ARMBOARD 7.5X6 YLW CONV (MISCELLANEOUS) ×8 IMPLANT
PAD ELECT DEFIB RADIOL ZOLL (MISCELLANEOUS) ×4 IMPLANT
SPONGE GAUZE 4X4 12PLY STER LF (GAUZE/BANDAGES/DRESSINGS) ×4 IMPLANT
STRIP CLOSURE SKIN 1/2X4 (GAUZE/BANDAGES/DRESSINGS) ×4 IMPLANT
SUT MNCRL AB 3-0 PS2 18 (SUTURE) ×4 IMPLANT
SUT PDS AB 1 CTX 36 (SUTURE) ×4 IMPLANT
SUT SILK  1 MH (SUTURE) ×1
SUT SILK 1 MH (SUTURE) ×3 IMPLANT
SUT VIC AB 1 CTX 18 (SUTURE) ×8 IMPLANT
SUT VIC AB 2-0 CT1 27 (SUTURE) ×1
SUT VIC AB 2-0 CT1 36 (SUTURE) ×4 IMPLANT
SUT VIC AB 2-0 CT1 TAPERPNT 27 (SUTURE) ×3 IMPLANT
SUT VIC AB 2-0 CTX 27 (SUTURE) ×4 IMPLANT
SUT VIC AB 3-0 SH 8-18 (SUTURE) ×12 IMPLANT
SWAB COLLECTION DEVICE MRSA (MISCELLANEOUS) IMPLANT
SYR 50ML SLIP (SYRINGE) IMPLANT
SYSTEM SAHARA CHEST DRAIN ATS (WOUND CARE) ×8 IMPLANT
TAPE CLOTH SURG 4X10 WHT LF (GAUZE/BANDAGES/DRESSINGS) ×4 IMPLANT
TOWEL OR 17X24 6PK STRL BLUE (TOWEL DISPOSABLE) ×4 IMPLANT
TOWEL OR 17X26 10 PK STRL BLUE (TOWEL DISPOSABLE) ×4 IMPLANT
TRAP SPECIMEN MUCOUS 40CC (MISCELLANEOUS) ×24 IMPLANT
TRAY FOLEY CATH 14FRSI W/METER (CATHETERS) ×4 IMPLANT
TUBE ANAEROBIC SPECIMEN COL (MISCELLANEOUS) IMPLANT
WATER STERILE IRR 1000ML POUR (IV SOLUTION) ×8 IMPLANT

## 2014-12-14 NOTE — Anesthesia Postprocedure Evaluation (Signed)
  Anesthesia Post-op Note  Patient: Linda [name]  Procedure(s) Performed: Procedure(s): SUBXYPHOID PERICARDIAL WINDOW (N/A) TRANSESOPHAGEAL ECHOCARDIOGRAM (TEE) (N/A) CHEST TUBE INSERTION (Left)  Patient Location: PACU  Anesthesia Type:General  Level of Consciousness: awake, alert  and oriented  Airway and Oxygen Therapy: Patient Spontanous Breathing and Patient connected to nasal cannula oxygen  Post-op Pain: mild  Post-op Assessment: Post-op Vital signs reviewed, Patient's Cardiovascular Status Stable, Respiratory Function Stable, Patent Airway and Pain level controlled              Post-op Vital Signs: stable  Last Vitals:  Filed Vitals:   12/14/14 1304  BP: 128/59  Pulse: 98  Temp:   Resp: 19    Complications: No apparent anesthesia complications

## 2014-12-14 NOTE — Transfer of Care (Signed)
Immediate Anesthesia Transfer of Care Note  Patient: Linda [name]  Procedure(s) Performed: Procedure(s): SUBXYPHOID PERICARDIAL WINDOW (N/A) TRANSESOPHAGEAL ECHOCARDIOGRAM (TEE) (N/A) CHEST TUBE INSERTION (Left)  Patient Location: PACU  Anesthesia Type:General  Level of Consciousness: awake, alert  and oriented  Airway & Oxygen Therapy: Patient Spontanous Breathing and Patient connected to face mask oxygen  Post-op Assessment: Report given to RN and Post -op Vital signs reviewed and stable  Post vital signs: Reviewed and stable  Last Vitals:  Filed Vitals:   12/14/14 0425  BP: 115/76  Pulse: 94  Temp:   Resp: 19    Complications: No apparent anesthesia complications

## 2014-12-14 NOTE — Progress Notes (Signed)
  Echocardiogram Echocardiogram Transesophageal has been performed.  Bobbye Charleston 12/14/2014, 9:06 AM

## 2014-12-14 NOTE — CV Procedure (Signed)
Intra-operative Transesopgageal Echocardiography Report:  Linda [name] is a 69 year old female with stage IV breast cancer diagnosed in February 2013. She is status post chemotherapy and bilateral mastectomies and has developed brain metastases, pleural and pericardial effusions, and bilateral pulmonary emboli. She is now scheduled to undergo pericardial window by Dr. Roxy Manns. She has developed progressive shortness of breath and anasarca. Transthoracic echo arteriogram revealed a large free-flowing pericardial effusion with early signs of tamponade.   The patient was brought to the operating room at [facility] and general anesthesia was induced without difficulty. Following endotracheal intubation and orogastric suctioning, the transesophageal echocardiography probe was inserted into the esophagus without difficulty.  Impression: Pre-drainage findings.  1. Pericardial  Cavity: There was a large circumferential pericardial effusion which appeared free of fibrinous material  or masses. The effusion was largest in the posterior and lateral regions. In the posterior pericardium, the effusion measured 3.9 cm. In the lateral dimension the effusion measured 5.9 cm and in the apical region 3.5 cm. There was evidence of right atrial and right ventricular diastolic invagination but no collapse. The left ventricle was underfilled. The heart appeared to be rocking within the pericardial space.  2. Aortic valve: The aortic valve was trileaflet. The leaflets are mildly thickened and opened normally without aortic insufficiency.  3. Mitral valve: The mitral leaflets appeared to coapt normally. The left ventricle was underfilled and there was an increased zone  of leaflet coaptation. There was trace mitral insufficiency.  4. Left ventricle: The left ventricular cavity was underfilled. It was difficult to assess for wall motion abnormalities but there appeared to be no regional wall motion abnormalities that  could be appreciated.  5. Right ventricle: The right ventricular cavity was underfilled as noted. There was invagination of the free wall of the right ventricle in diastole. There was no diastoliccollapse.  6. Tricuspid valve: There was 1+ tricuspid insufficiency. The tricuspid leaflets showed no vegetations or flail segments.  7. Right atrium: There  was right atrial diastolic invagination without collapse.  8. Interatrial septum: There was a possible small atrial septal defect by color Doppler. This could not be fully confirmed however.  9. Left atrium: There was no thrombus noted within the left atrium or left atrial appendage. The left atrial appendage appeared small.  10. Descending aorta: The ascending aorta showed a well-defined aortic root and sinotubular ridge without dilatation or effacement. The wall of the ascending aorta appeared normal.  11. Descending aorta: The descending aorta appeared free of atheromatous disease and measured 2.7 cm in diameter.  12. Pleural space: There appeared to be a large left pleural effusion.  Post-drainage findings:  1. Pericardial cavity: The pericardial fluid almost entirely evacuated. There was a trace amount of residual pericardial fluid.  2. Aortic valve: The aortic valve was unchanged from the pre-drainage study. There was no aortic insufficiency and the valve opened normally.  3. Valve: The mitral valve appeared to open normally there was trace mitral insufficiency. There were no vegetations or flail segments noted on the mitral leaflets.  4. Left ventricle: The left ventricle appeared to be contracting normally with an ejection fraction estimated 60-65%. There were no wall motion abnormalities.  5. Right ventricle: The right ventricle appeared to be showed improved filling. There was no diastolic invagination of the right ventricular free wall. There was normal contractility of the right ventricle free wall and normal appearing overall  right ventricular function.  6. Left right atrium: There was no residual diastolic invagination of  the right atrium.

## 2014-12-14 NOTE — Progress Notes (Signed)
Pt admitted to [facility] from PACU. Oriented to unit and room. Safety discussed and call bell with in reach. VSS, pt on 4L of oxygen. Pt has 2 chest tubes to 20cm of suction. Dressings are clean dry and intact. Will continue to monitor.  Erick Blinks, RN

## 2014-12-14 NOTE — Brief Op Note (Addendum)
12/13/2014 - 12/14/2014  9:10 AM  PATIENT:  Linda [name]  69 y.o. female  PRE-OPERATIVE DIAGNOSIS:  MALIGNANT PERICARDIAL AND LEFT PLEURAL EFFUSIONS  POST-OPERATIVE DIAGNOSIS:  MALIGNANT PERICARDIAL AND LEFT PLEURAL EFFUSIONS  PROCEDURE:   SUBXYPHOID PERICARDIAL WINDOW LEFT CHEST TUBE INSERTION  SURGEON:  Rexene Alberts, MD  ASSISTANT: Suzzanne Cloud, PA-C  ANESTHESIA:   general  SPECIMEN:  Source of Specimen:  pericardial fluid, pericardium, pleural fluid  DISPOSITION OF SPECIMEN:  Pathology  FINDINGS: around 800 ml bloody fluid drained from the pericardial space, pleural fluid was serous  DRAINS: 28 Blake mediastinal drain, left pleural chest tube  PATIENT CONDITION:  PACU - hemodynamically stable.   Rexene Alberts 12/14/2014 9:48 AM

## 2014-12-14 NOTE — Anesthesia Procedure Notes (Addendum)
Procedure Name: Intubation Date/Time: 12/14/2014 7:55 AM Performed by: Clearnce Sorrel Pre-anesthesia Checklist: Patient identified, Timeout performed, Emergency Drugs available, Suction available and Patient being monitored Patient Re-evaluated:Patient Re-evaluated prior to inductionOxygen Delivery Method: Circle system utilized Preoxygenation: Pre-oxygenation with 100% oxygen Intubation Type: IV induction, Rapid sequence and Cricoid Pressure applied Laryngoscope Size: Miller and 2 Grade View: Grade I Tube type: Oral Tube size: 8.0 mm Number of attempts: 1 Airway Equipment and Method: Stylet Placement Confirmation: ETT inserted through vocal cords under direct vision,  positive ETCO2,  CO2 detector and breath sounds checked- equal and bilateral Secured at: 22 cm Tube secured with: Tape Dental Injury: Teeth and Oropharynx as per pre-operative assessment  Comments: Intubation by Sherley Bounds, SRNA

## 2014-12-14 NOTE — Addendum Note (Signed)
Addendum  created 12/14/14 1705 by Roberts Gaudy, MD   Modules edited: Notes Section   Notes Section:  Pend: [phone]

## 2014-12-14 NOTE — Progress Notes (Signed)
UR COMPLETED  

## 2014-12-14 NOTE — Progress Notes (Signed)
Chaplain responded to page for pt requesting advanced directive. Pt received advanced directive information at her oncology appointment, but was unable to complete it as she was unexpectedly admitted. Chaplain provided patient with advanced directive packet and will report need to complete for daytime chaplain with notary presence.    12/14/14 0000  Clinical Encounter Type  Visited With Patient  Visit Type Spiritual support  Referral From Nurse  Consult/Referral To Chaplain  Spiritual Encounters  Spiritual Needs Emotional;Prayer  Stress Factors  Patient Stress Factors Health changes  Advance Directives (For Healthcare)  Does patient have an advance directive? No  Would patient like information on creating an advanced directive? Yes - Spiritual care consult ordered;Yes - Educational materials given  Marcelino Scot 12/14/2014 12:14 AM

## 2014-12-14 NOTE — Progress Notes (Signed)
COURTESY NOTE:  Greatly appreciate your help to this patient--missed her this AM, will drop by again tomorrow; I will schedule her for a repeat brain MRI and postpone her chemo (scheduled 7/5) until she has recovered. We have also placed a referral to home-based palliative care to start at time of discharge. I also gave pt yesterday advanced directives forms to complete and notarize-- will ask SW to assist while in house  SUMMARY:  69 y.o. [place] woman   (1) status post bilateral breast biopsies 07/24/2011, showing,    (a) on the right, a clinical T2 N0 papillary/ductal breast cancer, estrogen and progesterone receptor positive, HER-2 negative, with an MIB-1 of 10%;   (b) on the left, a clinical T3 N1, stage IIIA invasive ductal carcinoma, grade 3, triple positive, with an MIB-1 of 60%.  (2) Status post 4 dose dense cycles of doxorubicin/ cyclophosphamide followed by 4 dose dense cycles of paclitaxel and trastuzumab completed 12/09/2011  (3) the trastuzumab was continued for a total of one year (to 11/01/2012). Final echo on 11/04/2012 showing a well preserved ejection fraction of 55-60%.  (4) s/p bilateral mastectomies 01/21/2012 showing  (a) on the Right, an 8 mm invasive papillary carcinoma, grade 1, ypT1b ypN0  (b) on the Left, multiple microscopic foci of residual Invasive ductal carcinoma with evidence of dermal lymphatic involvement, pyT1a/T4 pyN0  (5) Postmastectomy radiation, completed 04/15/2012  (6) Started anastrazole 04/16/2012; normal dexa scan 05/19/2014 at the [facility] 09/11/2014 (7) CT angiogram 09/11/2014 shows new left pericardial effusion and new mediastinal and hilar lymphadenopathy; there were no suspicious upper abdominal findings; Cytology from the left effusion 09/11/2014 ([id number]) shows malignant cells which are HER-2 positive  (8) Whole body bone scan on 09/25/14 showed metastatic foci throughout  axial and appendicular skeleton. Areas of most potential concern include disease in the thoracic spine, disease in the right acetabulum, right superior pubic ramus and possibly proximal right femur, disease in the right humeral shaft and in both femoral shafts.  (8) Started trastuzumab/pertuzumab 09/26/2014, to be repeated every 21 days indefinitely (a) echocardiogram 09/11/2014 shows a well preserved ejection fraction.  (9) Brain MRI on 09/25/14 was positive for numerous small enhancing brain mets and calvarium/bone mets  (a) whole brain radiation on 10/09/14--10/27/2014  (10) started zolendronate 11/29/2014, to be repeated every 12 weeks  (11) left pleural effusion re-tapped 12/08/2014  (12) abraxane to be started 12/19/2014  (13) anasarca: pericardial effusion noted on echo 12/13/2014 with evidence of tamponade physiology, for pericardial window placement 12/14/2014  (14) bilateral PEs noted on CT/angio 12/13/2014

## 2014-12-14 NOTE — Progress Notes (Signed)
Advanced Heart Failure Rounding Note   Subjective:    Underwent pericardial window this am with 850 cc of bloody fluid out as well as placement of left chest tube for pleural effusion. Feels sore but breathing better.   CT scan shows metastatic disease and small PEs.     Objective:   Weight Range:  Vital Signs:   Temp:  [97.2 F (36.2 C)-98 F (36.7 C)] 97.5 F (36.4 C) (06/30 1129) Pulse Rate:  [84-139] 98 (06/30 1304) Resp:  [12-24] 19 (06/30 1304) BP: (100-135)/(30-94) 128/59 mmHg (06/30 1304) SpO2:  [92 %-100 %] 100 % (06/30 1304) Arterial Line BP: (98-133)/(65-103) 112/82 mmHg (06/30 1305) Weight:  [121.3 kg (267 lb 6.7 oz)-121.6 kg (268 lb 1.3 oz)] 121.3 kg (267 lb 6.7 oz) (06/30 0144) Last BM Date: 12/08/14  Weight change: Filed Weights   12/13/14 1609 12/14/14 0144  Weight: 121.6 kg (268 lb 1.3 oz) 121.3 kg (267 lb 6.7 oz)    Intake/Output:   Intake/Output Summary (Last 24 hours) at 12/14/14 1440 Last data filed at 12/14/14 1156  Gross per 24 hour  Intake 1748.15 ml  Output    760 ml  Net 988.15 ml     Physical Exam: General:  Alopecic. Ill appearing HEENT: normal Neck: supple. JVP hard to see looks up  . Carotids 2+ bilat; no bruits. No lymphadenopathy or thryomegaly appreciated. Cor: PMI nonpalpable. Regular rate & rhythm. No rubs, gallops or murmurs. Pericardial and pleural drains Lungs: decreased BS Abdomen: soft, nontender, +distended. No hepatosplenomegaly. No bruits or masses. Good bowel sounds. Extremities: no cyanosis, clubbing, rash, 3+ edema L>>>R with LUE lymphedema Neuro: alert & orientedx3, cranial nerves grossly intact. moves all 4 extremities w/o difficulty. Affect pleasant  Telemetry: SR 90s  Labs: Basic Metabolic Panel:  Recent Labs Lab 12/08/14 1438  12/08/14 1758 12/09/14 0555 12/13/14 0924 12/14/14 0242  NA 136  --  136 136 137 135  K 4.0  --  3.9 4.0 4.2 4.1  CL  --   --  103 101  --  102  CO2 23  --  25 27 21* 24   GLUCOSE 122  --  119* 118* 127 140*  BUN 16.0  --  17 19 32.1* 34*  CREATININE 0.8  --  0.76 0.99 1.1 1.12*  CALCIUM 8.7  < > 8.6* 8.9 9.2 9.0  < > = values in this interval not displayed.  Liver Function Tests:  Recent Labs Lab 12/08/14 1438 12/08/14 1758 12/09/14 0555 12/13/14 0924 12/14/14 0242  AST 22 24  --  20 31  ALT 32 33  --  27 30  ALKPHOS 84 80  --  79 70  BILITOT 0.47 0.8  --  0.51 0.5  PROT 6.6 7.0 7.0 6.6 6.9  ALBUMIN 3.4* 3.8  --  3.4* 3.4*   No results for input(s): LIPASE, AMYLASE in the last 168 hours. No results for input(s): AMMONIA in the last 168 hours.  CBC:  Recent Labs Lab 12/08/14 1438 12/08/14 1758 12/09/14 0555 12/13/14 0923  WBC 6.7 7.1 7.5 8.3  NEUTROABS 4.6 4.9  --  6.2  HGB 10.7* 10.3* 10.6* 10.7*  HCT 33.3* 33.5* 33.9* 32.8*  MCV 94.9 94.4 96.6 92.4  PLT 238 257 261 265    Cardiac Enzymes:  Recent Labs Lab 12/08/14 1758  TROPONINI <0.03    BNP: BNP (last 3 results)  Recent Labs  09/10/14 1740 12/08/14 1758 12/13/14 1703  BNP 80.9 29.3 21.1  ProBNP (last 3 results) No results for input(s): PROBNP in the last 8760 hours.    Other results:  Imaging: Dg Chest 1 View  12/13/2014   CLINICAL DATA:  69 year old female with history of breast cancer and shortness of breath.  EXAM: CHEST  1 VIEW  COMPARISON:  Chest radiograph dated 12/09/2014.  FINDINGS: Right IJ central Port-A-Cath with tip at atrial caval junction in stable positioning. There has been interval increase in the left lower lung field opacity likely combination of pleural effusion with associated compressive atelectasis of the adjacent lung. Superimposed pneumonia is not excluded. Small right pleural effusion. No pneumothorax. Stable cardiac silhouette. Grossly unremarkable osseous structures. Left axillary surgical clips.  IMPRESSION: Interval increase in the left pleural effusion with consolidative changes of the adjacent lung likely atelectasis/  pneumonia. Clinical correlation and follow-up recommended.   Electronically Signed   By: Anner Crete M.D.   On: 12/13/2014 17:36   Ct Angio Chest Pe W/cm &/or Wo Cm  12/13/2014   CLINICAL DATA:  Shortness of breath and coughing. Worsening swelling of left arm. Breast cancer diagnosed 2/13 with right mastectomy and left modified radical mastectomy. Radiation therapy. Last chemotherapy 4/16.  EXAM: CT ANGIOGRAPHY CHEST WITH CONTRAST  TECHNIQUE: Multidetector CT imaging of the chest was performed using the standard protocol during bolus administration of intravenous contrast. Multiplanar CT image reconstructions and MIPs were obtained to evaluate the vascular anatomy.  CONTRAST:  38mL OMNIPAQUE IOHEXOL 350 MG/ML SOLN  COMPARISON:  Plain film 12/13/2014.  Chest CT of 09/11/2014  FINDINGS: Mediastinum/Nodes: The quality of this exam for evaluation of pulmonary embolism is sufficient. Relatively small volume but definite pulmonary emboli. Examples within a right upper lobe segmental branch in image 76 of series 6 and a right lower lobe subsegmental branch on image 157 of series 6.  No supraclavicular adenopathy. A right Port-A-Cath which terminates at the mid to high at right atrium. Normal heart size. Moderate to large pericardial effusion, increased. Multivessel coronary artery atherosclerosis. Lad coronary artery atherosclerosis on image 44. Elevated right heart pressures, with reflux of contrast into the IVC and hepatic veins.  Resolution of previously described mediastinal adenopathy. No hilar adenopathy. No internal mammary adenopathy. Left axillary node dissection, without axillary adenopathy. Bilateral mastectomy.  Lungs/Pleura: Moderate left and small right pleural effusions, both enlarged since the prior CT. Motion and patient body habitus degradation. Right base subsegmental atelectasis.  Subpleural radiation fibrosis within the left upper lobe. Collapse/consolidative change in the left lower lobe.  Improved since 09/11/2014.  Upper abdomen: Normal imaged portions of the liver, spleen, stomach, adrenal glands. Trace perihepatic ascites.  Musculoskeletal: New heterogeneous sclerosis, consistent with osseous metastasis.  Review of the MIP images confirms the above findings.  IMPRESSION: 1. Small volume pulmonary emboli. These results were called by telephone at the time of interpretation on 12/13/2014 at 6:09 pm to covering nurse, Melvenia Needles, who verbally acknowledged these results. 2. Enlargement of bilateral pleural effusions and a now large pericardial effusion. suggestion of elevated right heart pressures. 3.  Atherosclerosis, including within the coronary arteries. 4. Left base aeration which is improved. Residual collapse/consolidative changes could represent atelectasis and/or infection. 5. Resolution of mediastinal and hilar adenopathy. 6. Development of osseous metastasis. 7. Trace perihepatic ascites.   Electronically Signed   By: Abigail Miyamoto M.D.   On: 12/13/2014 18:13   Dg Chest Port 1 View  12/14/2014   CLINICAL DATA:  Pericardial window, pericardial tamponade and metastatic breast cancer  EXAM: PORTABLE CHEST - 1 VIEW  COMPARISON:  CT 12/13/2014, chest radiograph 12/13/2014  FINDINGS: Right-sided Port-A-Cath in place with tip over the right atrium. Left-sided chest tubes are noted. No pneumothorax. Interval decrease in now small left pleural effusion with persistent retrocardiac opacity. Hazy right lower lobe airspace opacity could indicate atelectasis or pleural fluid. Moderate enlargement of the cardiac silhouette reidentified. New left IJ central line noted with tip at the brachiocephalic/ SVC junction.  IMPRESSION: Decrease in now small left pleural effusion.  No pneumothorax.   Electronically Signed   By: Conchita Paris M.D.   On: 12/14/2014 10:12      Medications:     Scheduled Medications: . acetaminophen  1,000 mg Oral 4 times per day   Or  . acetaminophen (TYLENOL) oral liquid 160  mg/5 mL  1,000 mg Oral 4 times per day  . anastrozole  1 mg Oral Daily  . B-complex with vitamin C  1 tablet Oral Daily  . benzonatate  200 mg Oral TID  . bisacodyl  10 mg Oral Daily  . calcium-vitamin D  1 tablet Oral BID  . cefUROXime (ZINACEF)  IV  1.5 g Intravenous Q12H  . cholecalciferol  1,000 Units Oral Daily  . fentaNYL      . multivitamins with iron  1 tablet Oral Daily  . oxyCODONE      . senna-docusate  1 tablet Oral QHS  . tobramycin-dexamethasone  1 drop Left Eye Q4H while awake     Infusions: . dextrose 5 % and 0.45 % NaCl with KCl 20 mEq/L       PRN Medications:  ALPRAZolam, fentaNYL (SUBLIMAZE) injection, HYDROcodone-homatropine, lidocaine-prilocaine, nitroGLYCERIN, ondansetron (ZOFRAN) IV, oxyCODONE, potassium chloride, prochlorperazine, traMADol, zolpidem   Assessment:   1.  Large hemorrhagic pericardial effusion with early tamponade    -s/p window 6/30 2. Large pleural effusion   --s/p chest tube 6/30 3. Metastatic/recurrent breast CA 4. Acute diastolic HF with anasarca 5. Acute respiratory failure 6. Small RUL/RLL PE   Plan/Discussion:     Breathing improved after pericardiocentesis/thoracentesis but still with massive volume overload. Will begin IV lasix. Will also likely need lymphedema sleeve on left (OT consulted)  Long talk about prognosis and code status with patient and her family. They would like to talk to Dr. Jana Hakim regarding this and what role there is for more chemo. She does want to be DNR /DNI.   Appreciate Dr. Lianne Moris note about IVC filter. Given hemorrhagic pericardial effusion, not candidate for anticoagulation. Will use SCDs for now. Will ask for Dr. Virgie Dad input. If he thinks further chemo may help her get back to better functional capacity and extend her life then may be worthwhile; otherwise I would forego.  Elric Tirado,MD 2:49 PM  Length of Stay: 1  Mattia Liford MD 12/14/2014, 2:40 PM  Advanced Heart  Failure Team Pager [phone] (M-F; [place])  Please contact [facility] for night-coverage after hours (4p -7a ) and weekends on [email or web address]

## 2014-12-14 NOTE — Care Management Note (Signed)
Case Management Note  Patient Details  Name: Linda [name] MRN: [id number] Date of Birth: 12-20-1945 Subjective/Objective:               Pt from home with family admitted with  pericardial effusion with pericardial tamponade, malignant L pleural effusion and metastatic breast ca.  Action/Plan: Return to home when medically stable. CM to f/u with d/c needs.   Expected Discharge Date:                  Expected Discharge Plan:  Home/Self Care  In-House Referral:     Discharge planning Services  CM Consult  Post Acute Care Choice:    Choice offered to:     DME Arranged:    DME Agency:     HH Arranged:    HH Agency:     Status of Service:  In process, will continue to follow  Medicare Important Message Given:    Date Medicare IM Given:    Medicare IM give by:    Date Additional Medicare IM Given:    Additional Medicare Important Message give by:     If discussed at [place] of Stay Meetings, dates discussed:    Additional Comments: Maricela Schreur [facility]) [phone],  Krystalle Pilkington (Relative)  [phone]  Sharin Mons, RN 12/14/2014, 7:40 PM

## 2014-12-14 NOTE — Addendum Note (Signed)
Addendum  created 12/14/14 1728 by Roberts Gaudy, MD   Modules edited: Notes Section   Notes Section:  File: [id number]; Pend: [id number]; Pend: [id number]; Raelyn Number: [id number]; Raelyn Number: [id number]; Pend: [id number]

## 2014-12-14 NOTE — Anesthesia Preprocedure Evaluation (Addendum)
Anesthesia Evaluation  Patient identified by MRN, date of birth, ID band Patient awake    Reviewed: Allergy & Precautions, NPO status , Patient's Chart, lab work & pertinent test results  Airway Mallampati: II  TM Distance: >3 FB Neck ROM: Full    Dental  (+) Partial Lower, Partial Upper   Pulmonary shortness of breath and lying,    + decreased breath sounds      Cardiovascular hypertension, Pt. on medications Rhythm:Regular Rate:Tachycardia     Neuro/Psych    GI/Hepatic   Endo/Other    Renal/GU      Musculoskeletal   Abdominal   Peds  Hematology   Anesthesia Other Findings   Reproductive/Obstetrics                            Anesthesia Physical Anesthesia Plan  ASA: IV  Anesthesia Plan: General   Post-op Pain Management:    Induction: Intravenous  Airway Management Planned: Oral ETT  Additional Equipment: Arterial line and CVP  Intra-op Plan:   Post-operative Plan: Extubation in OR and Possible Post-op intubation/ventilation  Informed Consent: I have reviewed the patients History and Physical, chart, labs and discussed the procedure including the risks, benefits and alternatives for the proposed anesthesia with the patient or authorized representative who has indicated his/her understanding and acceptance.   Dental advisory given  Plan Discussed with: Anesthesiologist, Surgeon and CRNA  Anesthesia Plan Comments:        Anesthesia Quick Evaluation

## 2014-12-14 NOTE — Progress Notes (Signed)
TCTS BRIEF PROGRESS NOTE  Day of Surgery  S/P Procedure(s) (LRB): SUBXYPHOID PERICARDIAL WINDOW (N/A) TRANSESOPHAGEAL ECHOCARDIOGRAM (TEE) (N/A) CHEST TUBE INSERTION (Left)   Doing well post op Mild soreness but breathing much improved Sinus rhythm w/ stable vitals Chest tube output low  Plan: Continue routine early postop  Rexene Alberts 12/14/2014 6:09 PM

## 2014-12-14 NOTE — Consult Note (Addendum)
Referred by:  Dr. Ricard Dillon ([facility])  Reason for referral: possible need for IVC filter  History of Present Illness  Linda [name] is a 69 y.o. (07-Feb-1946) female who presents with chief complaint: pulmonary embolism.  Patient is recovering from her recent surgery and was difficult to wake, subsequently history if obtained from her chart.  Pt has had Stage IV breast cancer with widely disseminated metastasis.  She has undergone chemo/radiation for such.  She went to [facility] for a TEE today, where a pericardial tamponade was found.  Pt emergently went to OR today for pericardial window with Dr. Ricard Dillon.  Dr. Ricard Dillon noted intraoperatively 800 cc of hemorrhagic fluid drained from the pericardium.  On her CTA on 12/13/14 demonstrated pulmonary emboli in right lung.  Consultation is obtained to help determine if IVC filter needs to be placed.  Past Medical History  Diagnosis Date  . Cancer 08-13-11    07-31-11-Dx. Bilarteral Breast cancer-left greater than rt.  . Hematuria, undiagnosed cause 08-13-11    Being evaluated by [facility] 08-14-11  . Hypertension   . Bronchitis     hx  . GERD (gastroesophageal reflux disease)     doing well  . 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 Seroma 02/04/12    right breast  200cc removed  erythema on right side  . Seasonal allergies   . History of radiation therapy 02/20/12-04/15/12    left breast,total 60.4 Gy  . Wears partial dentures     wears upper and lower partial  . Allergy   . Blood transfusion without reported diagnosis   . 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   . Cancer of central portion of left female breast 08/01/2011  . S/P radiation therapy 10/09/14-10/27/14    whole brain 37.5Gy/30f  . Acute pulmonary embolism 12/13/2014    Past Surgical History  Procedure Laterality Date  . Child  birth  08-13-11    x3 -NVD  . Portacath placement  08/15/2011    Procedure: INSERTION PORT-A-CATH;  Surgeon: DHarl Bowie MD;  Location: [facility];  Service: General;  Laterality: N/A;  . Mastectomy w/ sentinel node biopsy  01/21/2012    Procedure: MASTECTOMY WITH SENTINEL LYMPH NODE BIOPSY;  Surgeon: DHarl Bowie MD;  Location[facility]  Service: General;  Laterality: Bilateral;  Left modified radical mastectomy, Rt mastectomy with Sentinel lymphnode biospy  . Breast surgery    . Port-a-cath removal Right 12/22/2012    Procedure: REMOVAL PORT-A-CATH;  Surgeon: DHarl Bowie MD;  Location: [facility]  Service: General;  Laterality: Right;    History   Social History  . Marital Status: Single    Spouse Name: N/A  . Number of Children: 3  . Years of Education: N/A   Occupational History  . Retired    Social History Main Topics  . Smoking status: Never Smoker   . Smokeless tobacco: Never Used  . Alcohol Use: 0.0 oz/week    0 Standard drinks or equivalent per week     Comment: rare- occ.  . Drug Use: No  . Sexual Activity: No     Comment: gxp3, 1st child age 69 menopause mid 461's,nohrt   Other Topics Concern  . Not on file   Social History Narrative   Lives with son.  Single.  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 She moved to [place] about 2 years ago but has a Games developer in [place]. Son Gerald Stabs lives in [place] and works as a Airline pilot. His wife is a Marine scientist. Son Shanon Brow lives at [place] and is a Advertising account executive in addition to having the [employer]. The patient attends a local [place] here.    Family History  Problem Relation Age of Onset  . Heart disease Mother   . Cancer Mother 19    breast, , 67 deceased  . Heart attack Father   . Cancer Sister 17    breast  . Colon cancer Neg Hx      Current Facility-Administered Medications  Medication Dose Route Frequency Provider Last Rate Last Dose  . 0.9 % irrigation (POUR BTL)    PRN Rexene Alberts, MD   2,000 mL at 12/14/14 0739  . [MAR Hold] acetaminophen (TYLENOL) tablet 650 mg  650 mg Oral Q4H PRN Rhonda G Barrett, PA-C      . [MAR Hold] ALPRAZolam (XANAX) tablet 0.25 mg  0.25 mg Oral BID PRN Evelene Croon Barrett, PA-C      . [MAR Hold] anastrozole (ARIMIDEX) tablet 1 mg  1 mg Oral Daily Rhonda G Barrett, PA-C      . [MAR Hold] B-complex with vitamin C tablet 1 tablet  1 tablet Oral Daily Jolaine Artist, MD      . Doug Sou Hold] benzonatate (TESSALON) capsule 200 mg  200 mg Oral TID Evelene Croon Barrett, PA-C   200 mg at 12/13/14 2111  . [MAR Hold] calcium-vitamin D (OSCAL WITH D) 500-200 MG-UNIT per tablet 1 tablet  1 tablet Oral BID Lonn Georgia, PA-C   1 tablet at 12/13/14 2111  . [MAR Hold] cholecalciferol (VITAMIN D) tablet 1,000 Units  1,000 Units Oral Daily Rhonda G Barrett, PA-C      . EPINEPHrine (ADRENALIN) 4 mg in dextrose 5 % 250 mL (0.016 mg/mL) infusion  0.5-20 mcg/min Intravenous To OR Rexene Alberts, MD      . heparin ADULT infusion 100 units/mL (25000 units/250 mL)  1,650 Units/hr Intravenous Continuous Karren Cobble, [facility]   Stopped at 12/14/14 0304  . [MAR Hold] HYDROcodone-homatropine (HYCODAN) 5-1.5 MG/5ML syrup 5 mL  5 mL Oral Q6H PRN Evelene Croon Barrett, PA-C   5 mL at 12/14/14 0146  . [MAR Hold] lidocaine-prilocaine (EMLA) cream 1 application  1 application Topical PRN Rhonda G Barrett, PA-C      . [MAR Hold] multivitamins with iron tablet 1 tablet  1 tablet Oral Daily Rhonda G Barrett, PA-C      . [MAR Hold] nitroGLYCERIN (NITROSTAT) SL tablet 0.4 mg  0.4 mg Sublingual Q5 Min x 3 PRN Rhonda G Barrett, PA-C      . [MAR Hold] ondansetron (ZOFRAN) injection 4 mg  4 mg Intravenous Q6H PRN Rhonda G Barrett, PA-C      . [MAR Hold] prochlorperazine (COMPAZINE) tablet 10 mg  10 mg Oral Q6H PRN Rhonda G Barrett,  PA-C      . [MAR Hold] tobramycin-dexamethasone (TOBRADEX) ophthalmic suspension 1 drop  1 drop Left Eye Q4H while awake Rhonda G Barrett, PA-C   1 drop at 12/14/14 0517  . [MAR Hold] zolpidem (AMBIEN) tablet 5 mg  5 mg Oral QHS PRN Lonn Georgia, PA-C  Facility-Administered Medications Ordered in Other Encounters  Medication Dose Route Frequency Provider Last Rate Last Dose  . etomidate (AMIDATE) injection    Anesthesia Intra-op Clearnce Sorrel, CRNA   18 mg at 12/14/14 0753  . fentaNYL (SUBLIMAZE) injection    Anesthesia Intra-op Clearnce Sorrel, CRNA   100 mcg at 12/14/14 0827  . glycopyrrolate (ROBINUL) injection    Anesthesia Intra-op Clearnce Sorrel, CRNA   0.8 mg at 12/14/14 6734  . lactated ringers infusion    Continuous PRN Clearnce Sorrel, CRNA      . lactated ringers infusion    Continuous PRN Clearnce Sorrel, CRNA      . Lidocaine HCl 4 % topical solution   Topical Anesthesia Intra-op Clearnce Sorrel, CRNA   4 mL at 12/14/14 0755  . midazolam (VERSED) 5 MG/5ML injection    Anesthesia Intra-op Clearnce Sorrel, CRNA   1 mg at 12/14/14 1937  . neostigmine (BLOXIVERZ) injection   Intravenous Anesthesia Intra-op Clearnce Sorrel, CRNA   5 mg at 12/14/14 9024  . ondansetron (ZOFRAN) injection   Intravenous Anesthesia Intra-op Clearnce Sorrel, CRNA   4 mg at 12/14/14 0973  . phenylephrine (NEO-SYNEPHRINE) 0.04 mg/mL in dextrose 5 % 250 mL infusion  10 mg  Continuous PRN Clearnce Sorrel, CRNA 15 mL/hr at 12/14/14 0845 10 mcg/min at 12/14/14 0845  . rocuronium [facility]) injection    Anesthesia Intra-op Clearnce Sorrel, CRNA   20 mg at 12/14/14 0818  . succinylcholine (ANECTINE) injection    Anesthesia Intra-op Clearnce Sorrel, CRNA   100 mg at 12/14/14 0753  . vecuronium (NORCURON) injection    Anesthesia Intra-op Clearnce Sorrel, CRNA   2 mg at 12/14/14 5329     Allergies  Allergen Reactions  . Ace Inhibitors Cough    REVIEW OF SYSTEMS:  (Positives checked otherwise negative)  CARDIOVASCULAR:   [ ]  chest pain,  [ ]  chest  pressure,  [ ]  palpitations,  [x]  shortness of breath when laying flat,  [x]  shortness of breath with exertion,   [ ]  pain in feet when walking,  [ ]  pain in feet when laying flat, [ ]  history of blood clot in veins (DVT),  [ ]  history of phlebitis,  [x]  swelling in legs,  [ ]  varicose veins  PULMONARY:   [x]  cough,  [ ]  asthma,  [ ]  wheezing  NEUROLOGIC:   [ ]  weakness in arms or legs,  [ ]  numbness in arms or legs,  [ ]  difficulty speaking or slurred speech,  [ ]  temporary loss of vision in one eye,  [ ]  dizziness  HEMATOLOGIC:   [ ]  bleeding problems,  [ ]  problems with blood clotting too easily  MUSCULOSKEL:   [ ]  joint pain, [ ]  joint swelling  GASTROINTEST:   [ ]  vomiting blood,  [ ]  blood in stool     GENITOURINARY:   [ ]  burning with urination,  [ ]  blood in urine  PSYCHIATRIC:   [ ]  history of major depression  INTEGUMENTARY:   [x]  rashes,  [ ]  ulcers  CONSTITUTIONAL:   [ ]  fever,  [ ]  chills   Physical Examination  Filed Vitals:   12/13/14 2252 12/14/14 0144 12/14/14 0420 12/14/14 0425  BP: 114/83   115/76  Pulse: 93   94  Temp:   97.3 F (36.3 C)   TempSrc:   Oral   Resp: 22   19  Height:      Weight:  267 lb 6.7 oz (121.3 kg)  SpO2: 94%   92%    Body mass index is 39.47 kg/(m^2).  General: sedated, bald, ill appearing, obese  Head: [place]/AT  Ear/Nose/Throat: nares w/o erythema or drainage, oropharynx could not be evaluated due to sedation,   Eyes: unable to cooperate with evaluation  Neck: Supple, no nuchal rigidity, no palpable LAD  Pulmonary: shallow inhalation, faint breath sounds, left chest tube with serosanguinous drainage, right chest mediport cannulated  Cardiac: RRR, Nl S1, S2, no Murmurs, rubs or gallops  Vascular: Vessel Right Left  Radial Palpable Palpable  Brachial Palpable Palpable  Carotid Palpable, without bruit Palpable, without bruit  Aorta Not palpable due to pannus N/A  Femoral Not Palpable due to  pannus Not Palpable due to pannus  Popliteal Not palpable Not palpable  PT Not Palpable Not Palpable  DP Palpable Faintly Palpable   Gastrointestinal: soft, hypo BS, abdominal bandage in place, large pannus  Musculoskeletal: limited exam due to sedation, no obvious calf tenderness  Neurologic: cannot be exam due to sedation  Psychiatric: cannot be exam due to sedation  Dermatologic: See M/S exam for extremity exam, bilateral lower limb with some venous skin change  Lymph : No Cervical, Axillary, or Inguinal lymphadenopathy   Laboratory: CBC:    Component Value Date/Time   WBC 8.3 12/13/2014 0923   WBC 7.5 12/09/2014 0555   WBC 11.6* 09/10/2014 1236   RBC 3.55* 12/13/2014 0923   RBC 3.51* 12/09/2014 0555   RBC 3.87* 09/10/2014 1236   HGB 10.7* 12/13/2014 0923   HGB 10.6* 12/09/2014 0555   HGB 11.0* 09/10/2014 1236   HCT 32.8* 12/13/2014 0923   HCT 33.9* 12/09/2014 0555   HCT 35.1* 09/10/2014 1236   PLT 265 12/13/2014 0923   PLT 261 12/09/2014 0555   MCV 92.4 12/13/2014 0923   MCV 96.6 12/09/2014 0555   MCV 90.7 09/10/2014 1236   MCH 30.1 12/13/2014 0923   MCH 30.2 12/09/2014 0555   MCH 28.5 09/10/2014 1236   MCHC 32.6 12/13/2014 0923   MCHC 31.3 12/09/2014 0555   MCHC 31.4* 09/10/2014 1236   RDW 17.2* 12/13/2014 0923   RDW 17.1* 12/09/2014 0555   LYMPHSABS 1.1 12/13/2014 0923   LYMPHSABS 1.4 12/08/2014 1758   MONOABS 0.8 12/13/2014 0923   MONOABS 0.7 12/08/2014 1758   EOSABS 0.1 12/13/2014 0923   EOSABS 0.1 12/08/2014 1758   BASOSABS 0.1 12/13/2014 0923   BASOSABS 0.0 12/08/2014 1758    BMP:    Component Value Date/Time   NA 135 12/14/2014 0242   NA 137 12/13/2014 0924   K 4.1 12/14/2014 0242   K 4.2 12/13/2014 0924   CL 102 12/14/2014 0242   CL 107 11/01/2012 1045   CO2 24 12/14/2014 0242   CO2 21* 12/13/2014 0924   GLUCOSE 140* 12/14/2014 0242   GLUCOSE 127 12/13/2014 0924   GLUCOSE 96 11/01/2012 1045   BUN 34* 12/14/2014 0242   BUN 32.1*  12/13/2014 0924   CREATININE 1.12* 12/14/2014 0242   CREATININE 1.1 12/13/2014 0924   CREATININE 0.61 06/30/2013 0959   CALCIUM 9.0 12/14/2014 0242   CALCIUM 9.2 12/13/2014 0924   GFRNONAA 49* 12/14/2014 0242   GFRAA 57* 12/14/2014 0242    Coagulation: Lab Results  Component Value Date   INR 1.31 12/13/2014   INR 1.09 09/21/2014   No results found for: PTT  Radiology: Dg Chest 1 View  12/13/2014   CLINICAL DATA:  69 year old female with history of breast cancer and shortness of breath.  EXAM: CHEST  1 VIEW  COMPARISON:  Chest radiograph dated 12/09/2014.  FINDINGS: Right IJ central Port-A-Cath with tip at atrial caval junction in stable positioning. There has been interval increase in the left lower lung field opacity likely combination of pleural effusion with associated compressive atelectasis of the adjacent lung. Superimposed pneumonia is not excluded. Small right pleural effusion. No pneumothorax. Stable cardiac silhouette. Grossly unremarkable osseous structures. Left axillary surgical clips.  IMPRESSION: Interval increase in the left pleural effusion with consolidative changes of the adjacent lung likely atelectasis/ pneumonia. Clinical correlation and follow-up recommended.   Electronically Signed   By: Anner Crete M.D.   On: 12/13/2014 17:36   Ct Angio Chest Pe W/cm &/or Wo Cm  12/13/2014   CLINICAL DATA:  Shortness of breath and coughing. Worsening swelling of left arm. Breast cancer diagnosed 2/13 with right mastectomy and left modified radical mastectomy. Radiation therapy. Last chemotherapy 4/16.  EXAM: CT ANGIOGRAPHY CHEST WITH CONTRAST  TECHNIQUE: Multidetector CT imaging of the chest was performed using the standard protocol during bolus administration of intravenous contrast. Multiplanar CT image reconstructions and MIPs were obtained to evaluate the vascular anatomy.  CONTRAST:  19m OMNIPAQUE IOHEXOL 350 MG/ML SOLN  COMPARISON:  Plain film 12/13/2014.  Chest CT of  09/11/2014  FINDINGS: Mediastinum/Nodes: The quality of this exam for evaluation of pulmonary embolism is sufficient. Relatively small volume but definite pulmonary emboli. Examples within a right upper lobe segmental branch in image 76 of series 6 and a right lower lobe subsegmental branch on image 157 of series 6.  No supraclavicular adenopathy. A right Port-A-Cath which terminates at the mid to high at right atrium. Normal heart size. Moderate to large pericardial effusion, increased. Multivessel coronary artery atherosclerosis. Lad coronary artery atherosclerosis on image 44. Elevated right heart pressures, with reflux of contrast into the IVC and hepatic veins.  Resolution of previously described mediastinal adenopathy. No hilar adenopathy. No internal mammary adenopathy. Left axillary node dissection, without axillary adenopathy. Bilateral mastectomy.  Lungs/Pleura: Moderate left and small right pleural effusions, both enlarged since the prior CT. Motion and patient body habitus degradation. Right base subsegmental atelectasis.  Subpleural radiation fibrosis within the left upper lobe. Collapse/consolidative change in the left lower lobe. Improved since 09/11/2014.  Upper abdomen: Normal imaged portions of the liver, spleen, stomach, adrenal glands. Trace perihepatic ascites.  Musculoskeletal: New heterogeneous sclerosis, consistent with osseous metastasis.  Review of the MIP images confirms the above findings.  IMPRESSION: 1. Small volume pulmonary emboli. These results were called by telephone at the time of interpretation on 12/13/2014 at 6:09 pm to covering nurse, KMelvenia Needles who verbally acknowledged these results. 2. Enlargement of bilateral pleural effusions and a now large pericardial effusion. suggestion of elevated right heart pressures. 3.  Atherosclerosis, including within the coronary arteries. 4. Left base aeration which is improved. Residual collapse/consolidative changes could represent atelectasis  and/or infection. 5. Resolution of mediastinal and hilar adenopathy. 6. Development of osseous metastasis. 7. Trace perihepatic ascites.   Electronically Signed   By: KAbigail MiyamotoM.D.   On: 12/13/2014 18:13    Medical Decision Making  PCHANTI GOLUBSKIis a 69y.o. female who presents with: Stage IV breast cancer, hemorrhagic pericardial effusion, pulmonary embolus.   I agree with Dr. ORicard Dillonthat there is an indication for inferior vena cava filter placement.  However, in the big picture, I doubt that placement of such is likely to influence this patient's lifespan.  Unfortunately, thrombosis in multiple vessel beds  is sometimes the presentation  of end-stage disease in cancer.  IVC filters by nature are pro-thrombotic so retrieval of such or subsequent anti-coagulation is frequently needed. I suspect neither is an option in this patient.  An IVC filter in the pro-thrombotic state of end-stage cancer can sometimes predictably result in IVC thrombosis.  Will defer to the patient's preference.  Input from Oncology may be helping in this case.    Thank you for allowing [place] to participate in this patient's care.  Adele Barthel, MD [facility] Office: [phone] Pager: [phone]  12/14/2014, 9:35 AM   Addendum  I met with the patient's family and they think that the patient would NOT desire further procedures at this point.  They plan on readdressing the issue once she is more awke.  If she and they change their mind and want to proceed, they will contact me to arrange for the IVC filter placement.  Adele Barthel, MD [facility] Office: [phone] Pager: [phone]  12/14/2014, 12:17 PM

## 2014-12-14 NOTE — Op Note (Addendum)
CARDIOTHORACIC SURGERY OPERATIVE NOTE  Date of Procedure:   12/14/2014  Preoperative Diagnosis:    Pericardial Effusion with Pericardial Tamponade  Malignant Left Pleural Effusion  Metastatic Breast Cancer  Postoperative Diagnosis:  same  Procedure:      Subxyphoid Pericardial Window  Left Chest Tube Insertion  Surgeon:    Valentina Gu. Roxy Manns, MD  Assistant:    Suzzanne Cloud, PA-C  Anesthesia:    Roberts Gaudy, MD  Operative Findings:   Large hemorrhagic pericardial effusion  Serous left pleural effusion     BRIEF CLINICAL NOTE AND INDICATIONS FOR SURGERY  Patient is a 69 year old obese white female who recently presented with metastatic rest cancer and has been referred for surgical consultation due to the presence of an enlarging pericardial effusion. The patient was originally diagnosed with breast cancer in 2013. She did well until February of this year when she developed shortness of breath and cough. She was hospitalized in March with worsened cough and shortness of breath. Chest CT scan performed at that time revealed a small pericardial effusion and large bilateral pleural effusions, left greater than right. She underwent left thoracentesis which revealed malignant cells in the fluid consistent with stage IV metastatic breast cancer. Whole body bone scan revealed diffuse metastatic foci throughout the axial skeleton. MRI of the brain performed 09/25/2014 confirmed the presence of numerous small metastatic foci in the brain and calvarium. She was treated with chemotherapy and radiation therapy. She initially did well but she subsequently developed progressive shortness of breath and anasarca. Her left sided pleural effusion recurred and she underwent repeat thoracentesis on 12/09/2014. The patient underwent follow-up transthoracic echocardiogram earlier today that revealed a large free-flowing pericardial effusion with early signs of pericardial tamponade. The patient was  admitted to the hospital and cardia thoracic surgical consultation was requested. The patient has been seen in consultation and counseled at length regarding the indications, risks and potential benefits of surgery.  All questions have been answered, and the patient provides full informed consent for the operation as described.      DETAILS OF THE OPERATIVE PROCEDURE  The patient is brought to the operating room on the above mentioned date and placed in the supine position on the operating table. A radial arterial line and central venous catheter placed by the anesthesia team. Intravenous antibiotics are administered. Pneumatic sequential compression boots are placed on both lower extremities. General endotracheal anesthesia is induced uneventfully. A Foley catheter is placed. The patient's anterior chest abdomen and both groins are prepared and draped in a sterile manner.  A time out procedure is performed. A small vertical incision is made in the midline beginning at the xiphoid process and extending towards the umbilicus. The incision is completed through the subcutaneous tissue and linea alba using electrocautery. The left body of rectus abdominis muscle is retracted in a cephalad direction and the subjacent pre-peritoneal fat and fascia retracted inferiorly until the anterior surface of the pericardium was exposed. A small incision is made in the pericardium. A large amount of bloody fluid is immediately evacuated. Portions of the fluid are trapped to be sent for routine laboratory data, culture, and cytology. A total of 800 mL of fluid is evacuated. Transesophageal echocardiogram was performed confirming evacuation of the majority of the pericardial effusion. The pericardial space is drained with a single 32 French Bard chest tube placed through a separate stab incision. The abdominal fascia is closed in the midline using interrupted #1 Vicryl suture. Subcutaneous tissues are closed in layers  and the  skin is closed with a running subcuticular skin closure.  The patient's left lateral chest was separately prepped and draped in a sterile manner.  An incision was made in the anterior axillary line over lying approximately the 5th intercostal space.  A 28 Fr straight chest tube was placed into the pleural space and secured to the skin.  A total of 400 mL of serous fluid was evacuated.  A sample of fluid was sent for cytology and culture.  The patient tolerated the procedure well, was extubated in the operating room, and transported to the postanesthesia care unit in stable condition. Estimated blood loss was trivial. There are no intraoperative complications. All sponge instrument and needle counts are verified correct at completion the operation.  This patient should not be considered a candidate for pharmacologic anticoagulation in the near future due to the hemorrhagic nature of her pericardial effusion.   Valentina Gu. Roxy Manns MD 12/14/2014 9:48 AM

## 2014-12-15 ENCOUNTER — Telehealth: Payer: Self-pay | Admitting: *Deleted

## 2014-12-15 ENCOUNTER — Encounter (HOSPITAL_COMMUNITY): Payer: Self-pay | Admitting: Thoracic Surgery (Cardiothoracic Vascular Surgery)

## 2014-12-15 ENCOUNTER — Inpatient Hospital Stay (HOSPITAL_COMMUNITY): Payer: Medicare Other

## 2014-12-15 DIAGNOSIS — C7951 Secondary malignant neoplasm of bone: Secondary | ICD-10-CM

## 2014-12-15 DIAGNOSIS — I890 Lymphedema, not elsewhere classified: Secondary | ICD-10-CM

## 2014-12-15 DIAGNOSIS — I313 Pericardial effusion (noninflammatory): Principal | ICD-10-CM

## 2014-12-15 DIAGNOSIS — C7931 Secondary malignant neoplasm of brain: Secondary | ICD-10-CM

## 2014-12-15 LAB — CBC
HCT: 31.9 % — ABNORMAL LOW (ref 36.0–46.0)
HEMOGLOBIN: 10.3 g/dL — AB (ref 12.0–15.0)
MCH: 30 pg (ref 26.0–34.0)
MCHC: 32.3 g/dL (ref 30.0–36.0)
MCV: 93 fL (ref 78.0–100.0)
Platelets: 232 10*3/uL (ref 150–400)
RBC: 3.43 MIL/uL — ABNORMAL LOW (ref 3.87–5.11)
RDW: 16.6 % — AB (ref 11.5–15.5)
WBC: 7.7 10*3/uL (ref 4.0–10.5)

## 2014-12-15 LAB — BASIC METABOLIC PANEL
Anion gap: 5 (ref 5–15)
BUN: 22 mg/dL — AB (ref 6–20)
CALCIUM: 7.7 mg/dL — AB (ref 8.9–10.3)
CHLORIDE: 105 mmol/L (ref 101–111)
CO2: 28 mmol/L (ref 22–32)
Creatinine, Ser: 0.89 mg/dL (ref 0.44–1.00)
GFR calc non Af Amer: >60 mL/min (ref >60)
Glucose, Bld: 130 mg/dL — ABNORMAL HIGH (ref 65–99)
Potassium: 4.3 mmol/L (ref 3.5–5.1)
SODIUM: 138 mmol/L (ref 135–145)

## 2014-12-15 LAB — BLOOD GAS, ARTERIAL
ACID-BASE DEFICIT: 0 mmol/L (ref 0.0–2.0)
Bicarbonate: 23.9 mEq/L (ref 20.0–24.0)
FIO2: 0.21 %
O2 Saturation: 91.7 %
PCO2 ART: 37.1 mmHg (ref 35.0–45.0)
PO2 ART: 64.2 mmHg — AB (ref 80.0–100.0)
Patient temperature: 98.6
TCO2: 25 mmol/L (ref 0–100)
pH, Arterial: 7.424 (ref 7.350–7.450)

## 2014-12-15 LAB — PATHOLOGIST SMEAR REVIEW

## 2014-12-15 MED ORDER — SENNOSIDES-DOCUSATE SODIUM 8.6-50 MG PO TABS
2.0000 | ORAL_TABLET | Freq: Every day | ORAL | Status: DC
  Administered 2014-12-15 – 2014-12-16 (×2): 2 via ORAL
  Filled 2014-12-15 (×3): qty 2

## 2014-12-15 MED ORDER — METOLAZONE 5 MG PO TABS
5.0000 mg | ORAL_TABLET | Freq: Every day | ORAL | Status: DC
  Administered 2014-12-15 – 2014-12-17 (×3): 5 mg via ORAL
  Filled 2014-12-15 (×5): qty 1

## 2014-12-15 MED ORDER — POLYETHYLENE GLYCOL 3350 17 G PO PACK
17.0000 g | PACK | Freq: Every day | ORAL | Status: DC
  Filled 2014-12-15: qty 1

## 2014-12-15 MED ORDER — POLYETHYLENE GLYCOL 3350 17 G PO PACK
17.0000 g | PACK | Freq: Two times a day (BID) | ORAL | Status: DC
  Administered 2014-12-16 – 2014-12-17 (×2): 17 g via ORAL
  Filled 2014-12-15 (×6): qty 1

## 2014-12-15 NOTE — Progress Notes (Signed)
PT - Lymphedema Note  Patient evaluated for lymphedema wrapping.  Spoke with MD who reports she is fine to wrap (alleviated concern of pushing fluid toward chest with pleural effusion).  Performed lymphedema wrapping to LUE, wrist to elbow.  Please see full note in paper chart for details.  Will check on patient this pm and tomorrow to assess tolerance and progress.     12/15/2014 [place], [place]

## 2014-12-15 NOTE — Progress Notes (Signed)
PT Cancellation Note  Patient Details Name: CACHE DECOURSEY MRN: [id number] DOB: May 15, 1946   Cancelled Treatment:    Reason Eval/Treat Not Completed: Other (comment).  Order for activity states to Ambulate starting POD #2.  Will f/u tomorrow to initiate PT eval.     Alee Gressman, Thornton Papas 12/15/2014, 2:24 PM

## 2014-12-15 NOTE — Progress Notes (Signed)
Palliative care has received consult for hospice. Appears Dr. Jana Hakim has addressed GOC and options very well with Ms. Burges and I recommend consult to [facility] to assist with referral to home hospice agency of Ms. Hingle's choice. Please call [phone] if palliative care can be of assistance in the care of Ms. Vernard Gambles. Thank you.   Vinie Sill, NP Palliative Medicine Team Pager # [phone] (M-F 8a-5p) Team Phone # [phone] (Nights/Weekends)

## 2014-12-15 NOTE — Progress Notes (Signed)
Advanced Heart Failure Rounding Note   Subjective:    Underwent pericardial window this am with 850 cc of bloody fluid out as well as placement of left chest tube for pleural effusion. .   CT scan shows metastatic disease and small PEs.     Objective:   Weight Range:  Vital Signs:   Temp:  [96.3 F (35.7 C)-97.9 F (36.6 C)] 97.8 F (36.6 C) (07/01 1111) Pulse Rate:  [91-93] 91 (07/01 1111) Resp:  [15-20] 17 (07/01 1111) BP: (106-127)/(54-65) 124/65 mmHg (07/01 1111) SpO2:  [92 %-99 %] 96 % (07/01 1111) Arterial Line BP: (80-130)/(57-106) 80/57 mmHg (06/30 2346) Weight:  [264 lb 15.9 oz (120.2 kg)] 264 lb 15.9 oz (120.2 kg) (07/01 0442) Last BM Date: 12/08/14  Weight change: Filed Weights   12/13/14 1609 12/14/14 0144 12/15/14 0442  Weight: 268 lb 1.3 oz (121.6 kg) 267 lb 6.7 oz (121.3 kg) 264 lb 15.9 oz (120.2 kg)    Intake/Output:   Intake/Output Summary (Last 24 hours) at 12/15/14 1440 Last data filed at 12/15/14 0755  Gross per 24 hour  Intake 2223.32 ml  Output   3190 ml  Net -966.68 ml     Physical Exam: General:  Alopecic. Ill appearing HEENT: normal Neck: supple. JVP hard to see looks up  . Carotids 2+ bilat; no bruits. No lymphadenopathy or thryomegaly appreciated. Cor: PMI nonpalpable. Regular rate & rhythm. No rubs, gallops or murmurs. Pericardial and pleural drains Lungs: decreased BS Abdomen: soft, nontender, +distended. No hepatosplenomegaly. No bruits or masses. Good bowel sounds. Extremities: no cyanosis, clubbing, rash, 3+ edema L>>>R with LUE lymphedema Neuro: alert & orientedx3, cranial nerves grossly intact. moves all 4 extremities w/o difficulty. Affect pleasant  Telemetry: SR 90s  Labs: Basic Metabolic Panel:  Recent Labs Lab 12/08/14 1758 12/09/14 0555 12/13/14 0924 12/14/14 0242 12/15/14 0420  NA 136 136 137 135 138  K 3.9 4.0 4.2 4.1 4.3  CL 103 101  --  102 105  CO2 25 27 21* 24 28  GLUCOSE 119* 118* 127 140* 130*  BUN 17  19 32.1* 34* 22*  CREATININE 0.76 0.99 1.1 1.12* 0.89  CALCIUM 8.6* 8.9 9.2 9.0 7.7*    Liver Function Tests:  Recent Labs Lab 12/08/14 1758 12/09/14 0555 12/13/14 0924 12/14/14 0242  AST 24  --  20 31  ALT 33  --  27 30  ALKPHOS 80  --  79 70  BILITOT 0.8  --  0.51 0.5  PROT 7.0 7.0 6.6 6.9  ALBUMIN 3.8  --  3.4* 3.4*   No results for input(s): LIPASE, AMYLASE in the last 168 hours. No results for input(s): AMMONIA in the last 168 hours.  CBC:  Recent Labs Lab 12/08/14 1758 12/09/14 0555 12/13/14 0923 12/15/14 0420  WBC 7.1 7.5 8.3 7.7  NEUTROABS 4.9  --  6.2  --   HGB 10.3* 10.6* 10.7* 10.3*  HCT 33.5* 33.9* 32.8* 31.9*  MCV 94.4 96.6 92.4 93.0  PLT 257 261 265 232    Cardiac Enzymes:  Recent Labs Lab 12/08/14 1758  TROPONINI <0.03    BNP: BNP (last 3 results)  Recent Labs  09/10/14 1740 12/08/14 1758 12/13/14 1703  BNP 80.9 29.3 21.1    ProBNP (last 3 results) No results for input(s): PROBNP in the last 8760 hours.    Other results:  Imaging: Dg Chest 1 View  12/13/2014   CLINICAL DATA:  69 year old female with history of breast cancer and shortness of breath.  EXAM: CHEST  1 VIEW  COMPARISON:  Chest radiograph dated 12/09/2014.  FINDINGS: Right IJ central Port-A-Cath with tip at atrial caval junction in stable positioning. There has been interval increase in the left lower lung field opacity likely combination of pleural effusion with associated compressive atelectasis of the adjacent lung. Superimposed pneumonia is not excluded. Small right pleural effusion. No pneumothorax. Stable cardiac silhouette. Grossly unremarkable osseous structures. Left axillary surgical clips.  IMPRESSION: Interval increase in the left pleural effusion with consolidative changes of the adjacent lung likely atelectasis/ pneumonia. Clinical correlation and follow-up recommended.   Electronically Signed   By: Anner Crete M.D.   On: 12/13/2014 17:36   Ct Angio  Chest Pe W/cm &/or Wo Cm  12/13/2014   CLINICAL DATA:  Shortness of breath and coughing. Worsening swelling of left arm. Breast cancer diagnosed 2/13 with right mastectomy and left modified radical mastectomy. Radiation therapy. Last chemotherapy 4/16.  EXAM: CT ANGIOGRAPHY CHEST WITH CONTRAST  TECHNIQUE: Multidetector CT imaging of the chest was performed using the standard protocol during bolus administration of intravenous contrast. Multiplanar CT image reconstructions and MIPs were obtained to evaluate the vascular anatomy.  CONTRAST:  59mL OMNIPAQUE IOHEXOL 350 MG/ML SOLN  COMPARISON:  Plain film 12/13/2014.  Chest CT of 09/11/2014  FINDINGS: Mediastinum/Nodes: The quality of this exam for evaluation of pulmonary embolism is sufficient. Relatively small volume but definite pulmonary emboli. Examples within a right upper lobe segmental branch in image 76 of series 6 and a right lower lobe subsegmental branch on image 157 of series 6.  No supraclavicular adenopathy. A right Port-A-Cath which terminates at the mid to high at right atrium. Normal heart size. Moderate to large pericardial effusion, increased. Multivessel coronary artery atherosclerosis. Lad coronary artery atherosclerosis on image 44. Elevated right heart pressures, with reflux of contrast into the IVC and hepatic veins.  Resolution of previously described mediastinal adenopathy. No hilar adenopathy. No internal mammary adenopathy. Left axillary node dissection, without axillary adenopathy. Bilateral mastectomy.  Lungs/Pleura: Moderate left and small right pleural effusions, both enlarged since the prior CT. Motion and patient body habitus degradation. Right base subsegmental atelectasis.  Subpleural radiation fibrosis within the left upper lobe. Collapse/consolidative change in the left lower lobe. Improved since 09/11/2014.  Upper abdomen: Normal imaged portions of the liver, spleen, stomach, adrenal glands. Trace perihepatic ascites.   Musculoskeletal: New heterogeneous sclerosis, consistent with osseous metastasis.  Review of the MIP images confirms the above findings.  IMPRESSION: 1. Small volume pulmonary emboli. These results were called by telephone at the time of interpretation on 12/13/2014 at 6:09 pm to covering nurse, Melvenia Needles, who verbally acknowledged these results. 2. Enlargement of bilateral pleural effusions and a now large pericardial effusion. suggestion of elevated right heart pressures. 3.  Atherosclerosis, including within the coronary arteries. 4. Left base aeration which is improved. Residual collapse/consolidative changes could represent atelectasis and/or infection. 5. Resolution of mediastinal and hilar adenopathy. 6. Development of osseous metastasis. 7. Trace perihepatic ascites.   Electronically Signed   By: Abigail Miyamoto M.D.   On: 12/13/2014 18:13   Dg Chest Port 1 View  12/15/2014   CLINICAL DATA:  Followup exam.  Pericardial effusion.  EXAM: PORTABLE CHEST - 1 VIEW  COMPARISON:  12/14/2014.  FINDINGS: Cardiopericardial silhouette is enlarged but stable.  There is increased opacity at the right lung base with unchanged opacity at the left lung base, likely combination of pleural effusions and atelectasis. No convincing pulmonary edema. No pneumothorax.  The 2 left-sided  chest tubes are stable. Left internal jugular central venous line has its tip at the confluence of the left brachiocephalic vein and superior vena cava, also stable. Stable right anterior chest wall power Port-A-Cath.  IMPRESSION: 1. Increased opacity at the right lung base likely due to a combination pleural fluid and atelectasis. 2. No other change from the previous day's study. Cardiopericardial silhouette is unchanged in size.   Electronically Signed   By: Lajean Manes M.D.   On: 12/15/2014 08:12   Dg Chest Port 1 View  12/14/2014   CLINICAL DATA:  Pericardial window, pericardial tamponade and metastatic breast cancer  EXAM: PORTABLE CHEST - 1  VIEW  COMPARISON:  CT 12/13/2014, chest radiograph 12/13/2014  FINDINGS: Right-sided Port-A-Cath in place with tip over the right atrium. Left-sided chest tubes are noted. No pneumothorax. Interval decrease in now small left pleural effusion with persistent retrocardiac opacity. Hazy right lower lobe airspace opacity could indicate atelectasis or pleural fluid. Moderate enlargement of the cardiac silhouette reidentified. New left IJ central line noted with tip at the brachiocephalic/ SVC junction.  IMPRESSION: Decrease in now small left pleural effusion.  No pneumothorax.   Electronically Signed   By: Conchita Paris M.D.   On: 12/14/2014 10:12     Medications:     Scheduled Medications: . acetaminophen  1,000 mg Oral 4 times per day   Or  . acetaminophen (TYLENOL) oral liquid 160 mg/5 mL  1,000 mg Oral 4 times per day  . anastrozole  1 mg Oral Daily  . B-complex with vitamin C  1 tablet Oral Daily  . benzonatate  200 mg Oral TID  . bisacodyl  10 mg Oral Daily  . cholecalciferol  1,000 Units Oral Daily  . furosemide  40 mg Intravenous BID  . multivitamins with iron  1 tablet Oral Daily  . polyethylene glycol  17 g Oral BID  . potassium chloride  20 mEq Oral BID  . senna-docusate  2 tablet Oral QHS  . tobramycin-dexamethasone  1 drop Left Eye Q4H while awake    Infusions: . dextrose 5 % and 0.45 % NaCl with KCl 20 mEq/L 100 mL/hr at 12/15/14 0252    PRN Medications: ALPRAZolam, fentaNYL (SUBLIMAZE) injection, HYDROcodone-homatropine, lidocaine-prilocaine, nitroGLYCERIN, ondansetron (ZOFRAN) IV, oxyCODONE, potassium chloride, prochlorperazine, traMADol, zolpidem   Assessment:   1.  Large hemorrhagic pericardial effusion with early tamponade    -s/p window 6/30 2. Large pleural effusion   --s/p chest tube 6/30 3. Metastatic/recurrent breast CA 4. Acute diastolic HF with anasarca 5. Acute respiratory failure 6. Small RUL/RLL PE 7. DNR/DNI   Plan/Discussion:    Breathing  improved after pericardiocentesis/thoracentesis but still with marked volume overload. Continue IV lasix. + 5 mg metolazone.  Weight down 3 pounds.   OT following for lymphedema   Appreciate Dr. Lianne Moris note about IVC filter. Family elected to not pursue IVC.   Continue  SCDs for now.   Hospice referral pending.    CLEGG,AMY, NP-C  2:40 PM  Length of Stay: 2  Advanced Heart Failure Team Pager [phone] (M-F; 7a - 4p)  Please contact [facility] for night-coverage after hours (4p -7a ) and weekends on [email or web address]  Patient seen and examined with Darrick Grinder, NP. We discussed all aspects of the encounter. I agree with the assessment and plan as stated above.   Agree. See my earlier note for similar details.   Gracious Renken,MD 6:29 PM

## 2014-12-15 NOTE — Progress Notes (Signed)
Arterial line malpositioned. Would not draw back, dampened waveform, would not flush and low pressure readings of systolic 48'G. Attempted to reposition without success. Discontinued line, pressure held 5 minutes. No bleeding noted. Site cleaned and left open to air. RN aware.

## 2014-12-15 NOTE — Progress Notes (Addendum)
[address]       [place] [id number]             [phone]          1 Day Post-Op Procedure(s) (LRB): SUBXYPHOID PERICARDIAL WINDOW (N/A) TRANSESOPHAGEAL ECHOCARDIOGRAM (TEE) (N/A) CHEST TUBE INSERTION (Left)  Subjective: OOB in chair.  "My breathing is so much better."   Objective: Vital signs in last 24 hours: Patient Vitals for the past 24 hrs:  BP Temp Temp src Pulse Resp SpO2 Height Weight  12/15/14 0703 (!) 108/54 mmHg - - - 18 - - -  12/15/14 0700 - 97.9 F (36.6 C) Oral - - - - -  12/15/14 0442 127/64 mmHg - - - 20 92 % 5\' 9"  (1.753 m) 264 lb 15.9 oz (120.2 kg)  12/15/14 0430 - 97.2 F (36.2 C) Oral - - - - -  12/15/14 0208 (!) 106/59 mmHg - - - 17 92 % - -  12/14/14 2346 (!) 111/57 mmHg - - - 17 98 % - -  12/14/14 2344 - 97.5 F (36.4 C) Oral - - - - -  12/14/14 1940 (!) 127/57 mmHg - - 93 15 99 % - -  12/14/14 1938 - 97.5 F (36.4 C) Oral - - - - -  12/14/14 1630 - (!) 96.3 F (35.7 C) Axillary - - - - -  12/14/14 1304 (!) 128/59 mmHg - - 98 19 100 % - -  12/14/14 1129 (!) 134/93 mmHg 97.5 F (36.4 C) Axillary 93 16 99 % - -  12/14/14 1100 (!) 130/57 mmHg 97.2 F (36.2 C) - 89 13 97 % - -  12/14/14 1045 128/61 mmHg - - 92 14 96 % - -  12/14/14 1030 (!) 115/30 mmHg - - 89 16 96 % - -  12/14/14 1015 100/79 mmHg - - 86 12 99 % - -  12/14/14 1000 135/81 mmHg - - 91 18 93 % - -  12/14/14 0945 123/80 mmHg 97.2 F (36.2 C) - 92 19 94 % - -   Current Weight  12/15/14 264 lb 15.9 oz (120.2 kg)     Intake/Output from previous day: 06/30 0701 - 07/01 0700 In: 3527.3 [P.O.:120; I.V.:3357.3; IV Piggyback:50] Out: 2951 [Urine:2280; Chest Tube:1270]    PHYSICAL EXAM:  Heart: RRR Lungs: Slightly decreased BS in bases Wound: Clean and dry Chest tube: No air leak    Lab Results: CBC: Recent Labs  12/13/14 0923 12/15/14 0420  WBC 8.3 7.7  HGB 10.7* 10.3*  HCT 32.8* 31.9*  PLT 265 232   BMET:  Recent Labs   12/14/14 0242 12/15/14 0420  NA 135 138  K 4.1 4.3  CL 102 105  CO2 24 28  GLUCOSE 140* 130*  BUN 34* 22*  CREATININE 1.12* 0.89  CALCIUM 9.0 7.7*    PT/INR:  Recent Labs  12/13/14 1703  LABPROT 16.4*  INR 1.31   CXR: FINDINGS: Cardiopericardial silhouette is enlarged but stable.  There is increased opacity at the right lung base with unchanged opacity at the left lung base, likely combination of pleural effusions and atelectasis. No convincing pulmonary edema. No pneumothorax.  The 2 left-sided chest tubes are stable. Left internal jugular central venous line has its tip at the confluence of the left brachiocephalic vein and superior vena cava, also stable. Stable right anterior chest wall power Port-A-Cath.  IMPRESSION: 1. Increased opacity at the right lung base likely due to a  combination pleural fluid and atelectasis. 2. No other change from the previous day's study. Cardiopericardial silhouette is unchanged in size.   Assessment/Plan: S/P Procedure(s) (LRB): SUBXYPHOID PERICARDIAL WINDOW (N/A) TRANSESOPHAGEAL ECHOCARDIOGRAM (TEE) (N/A) CHEST TUBE INSERTION (Left) Pleural tube with no air leak, but continues to drain a large amount of serous fluid. Mediastinal tube output decreasing.  Could possibly transition CTs to water seal. Will decrease IVF, leave Foley one more day until more mobile. Dr. Virgie Dad recommendations noted. Will not place IVC filter at this time and palliative care to see pt. Continue current care per medical services.   LOS: 2 days    COLLINS,GINA H 12/15/2014  I have seen and examined the patient and agree with the assessment and plan as outlined.  However, I would leave both tubes on suction until dry.  I would expect that both tubes may continue to drain until her generalized anasarca has resolved, especially the left pleural tube.  Talc pleurodesis would be of no benefit (and could cause harm) if the chest tube output remains high.  I  would consider Pleur-X catheter placement if pericardial tube output resolves but left pleural tube remains high output after generalized anasarca has resolved.  Mobilize.  D/C central line and foley catheter tomorrow.  Dr Magrinat's input is greatly appreciated.  Rexene Alberts 12/15/2014 11:24 AM

## 2014-12-15 NOTE — Progress Notes (Signed)
Advanced Heart Failure Rounding Note   Subjective:    Underwent pericardial window 6/30 with 850 cc of bloody fluid out as well as placement of left chest tube for pleural effusion. Feels sore but breathing better.   CT scan shows metastatic disease and small PEs.   Feels better with diuresis. Less dyspneic.   Has decided on Palliative Care approach.     Objective:   Weight Range:  Vital Signs:   Temp:  [96.3 F (35.7 C)-97.9 F (36.6 C)] 97.9 F (36.6 C) (07/01 0700) Pulse Rate:  [91-93] 91 (07/01 1111) Resp:  [15-20] 17 (07/01 1111) BP: (106-127)/(54-65) 124/65 mmHg (07/01 1111) SpO2:  [92 %-99 %] 96 % (07/01 1111) Arterial Line BP: (80-130)/(57-106) 80/57 mmHg (06/30 2346) Weight:  [264 lb 15.9 oz (120.2 kg)] 264 lb 15.9 oz (120.2 kg) (07/01 0442) Last BM Date: 12/08/14  Weight change: Filed Weights   12/13/14 1609 12/14/14 0144 12/15/14 0442  Weight: 268 lb 1.3 oz (121.6 kg) 267 lb 6.7 oz (121.3 kg) 264 lb 15.9 oz (120.2 kg)    Intake/Output:   Intake/Output Summary (Last 24 hours) at 12/15/14 1335 Last data filed at 12/15/14 0755  Gross per 24 hour  Intake 2223.32 ml  Output   3190 ml  Net -966.68 ml     Physical Exam: General:  Alopecic. NAD HEENT: normal Neck: supple. JVP to jaw  . Carotids 2+ bilat; no bruits. No lymphadenopathy or thryomegaly appreciated. Cor: PMI nonpalpable. Regular rate & rhythm. No rubs, gallops or murmurs. Pericardial and pleural drains Lungs: decreased BS Abdomen: soft, nontender, +distended. No hepatosplenomegaly. No bruits or masses. Good bowel sounds. Extremities: no cyanosis, clubbing, rash, 3+ edema L>>>R with LUE lymphedema sleeve Neuro: alert & orientedx3, cranial nerves grossly intact. moves all 4 extremities w/o difficulty. Affect pleasant  Telemetry: SR 90s  Labs: Basic Metabolic Panel:  Recent Labs Lab 12/08/14 1758 12/09/14 0555 12/13/14 0924 12/14/14 0242 12/15/14 0420  NA 136 136 137 135 138  K 3.9 4.0  4.2 4.1 4.3  CL 103 101  --  102 105  CO2 25 27 21* 24 28  GLUCOSE 119* 118* 127 140* 130*  BUN 17 19 32.1* 34* 22*  CREATININE 0.76 0.99 1.1 1.12* 0.89  CALCIUM 8.6* 8.9 9.2 9.0 7.7*    Liver Function Tests:  Recent Labs Lab 12/08/14 1438 12/08/14 1758 12/09/14 0555 12/13/14 0924 12/14/14 0242  AST 22 24  --  20 31  ALT 32 33  --  27 30  ALKPHOS 84 80  --  79 70  BILITOT 0.47 0.8  --  0.51 0.5  PROT 6.6 7.0 7.0 6.6 6.9  ALBUMIN 3.4* 3.8  --  3.4* 3.4*   No results for input(s): LIPASE, AMYLASE in the last 168 hours. No results for input(s): AMMONIA in the last 168 hours.  CBC:  Recent Labs Lab 12/08/14 1438 12/08/14 1758 12/09/14 0555 12/13/14 0923 12/15/14 0420  WBC 6.7 7.1 7.5 8.3 7.7  NEUTROABS 4.6 4.9  --  6.2  --   HGB 10.7* 10.3* 10.6* 10.7* 10.3*  HCT 33.3* 33.5* 33.9* 32.8* 31.9*  MCV 94.9 94.4 96.6 92.4 93.0  PLT 238 257 261 265 232    Cardiac Enzymes:  Recent Labs Lab 12/08/14 1758  TROPONINI <0.03    BNP: BNP (last 3 results)  Recent Labs  09/10/14 1740 12/08/14 1758 12/13/14 1703  BNP 80.9 29.3 21.1    ProBNP (last 3 results) No results for input(s): PROBNP in the  last 8760 hours.    Other results:  Imaging: Dg Chest 1 View  12/13/2014   CLINICAL DATA:  69 year old female with history of breast cancer and shortness of breath.  EXAM: CHEST  1 VIEW  COMPARISON:  Chest radiograph dated 12/09/2014.  FINDINGS: Right IJ central Port-A-Cath with tip at atrial caval junction in stable positioning. There has been interval increase in the left lower lung field opacity likely combination of pleural effusion with associated compressive atelectasis of the adjacent lung. Superimposed pneumonia is not excluded. Small right pleural effusion. No pneumothorax. Stable cardiac silhouette. Grossly unremarkable osseous structures. Left axillary surgical clips.  IMPRESSION: Interval increase in the left pleural effusion with consolidative changes of the  adjacent lung likely atelectasis/ pneumonia. Clinical correlation and follow-up recommended.   Electronically Signed   By: Anner Crete M.D.   On: 12/13/2014 17:36   Ct Angio Chest Pe W/cm &/or Wo Cm  12/13/2014   CLINICAL DATA:  Shortness of breath and coughing. Worsening swelling of left arm. Breast cancer diagnosed 2/13 with right mastectomy and left modified radical mastectomy. Radiation therapy. Last chemotherapy 4/16.  EXAM: CT ANGIOGRAPHY CHEST WITH CONTRAST  TECHNIQUE: Multidetector CT imaging of the chest was performed using the standard protocol during bolus administration of intravenous contrast. Multiplanar CT image reconstructions and MIPs were obtained to evaluate the vascular anatomy.  CONTRAST:  10mL OMNIPAQUE IOHEXOL 350 MG/ML SOLN  COMPARISON:  Plain film 12/13/2014.  Chest CT of 09/11/2014  FINDINGS: Mediastinum/Nodes: The quality of this exam for evaluation of pulmonary embolism is sufficient. Relatively small volume but definite pulmonary emboli. Examples within a right upper lobe segmental branch in image 76 of series 6 and a right lower lobe subsegmental branch on image 157 of series 6.  No supraclavicular adenopathy. A right Port-A-Cath which terminates at the mid to high at right atrium. Normal heart size. Moderate to large pericardial effusion, increased. Multivessel coronary artery atherosclerosis. Lad coronary artery atherosclerosis on image 44. Elevated right heart pressures, with reflux of contrast into the IVC and hepatic veins.  Resolution of previously described mediastinal adenopathy. No hilar adenopathy. No internal mammary adenopathy. Left axillary node dissection, without axillary adenopathy. Bilateral mastectomy.  Lungs/Pleura: Moderate left and small right pleural effusions, both enlarged since the prior CT. Motion and patient body habitus degradation. Right base subsegmental atelectasis.  Subpleural radiation fibrosis within the left upper lobe. Collapse/consolidative  change in the left lower lobe. Improved since 09/11/2014.  Upper abdomen: Normal imaged portions of the liver, spleen, stomach, adrenal glands. Trace perihepatic ascites.  Musculoskeletal: New heterogeneous sclerosis, consistent with osseous metastasis.  Review of the MIP images confirms the above findings.  IMPRESSION: 1. Small volume pulmonary emboli. These results were called by telephone at the time of interpretation on 12/13/2014 at 6:09 pm to covering nurse, Melvenia Needles, who verbally acknowledged these results. 2. Enlargement of bilateral pleural effusions and a now large pericardial effusion. suggestion of elevated right heart pressures. 3.  Atherosclerosis, including within the coronary arteries. 4. Left base aeration which is improved. Residual collapse/consolidative changes could represent atelectasis and/or infection. 5. Resolution of mediastinal and hilar adenopathy. 6. Development of osseous metastasis. 7. Trace perihepatic ascites.   Electronically Signed   By: Abigail Miyamoto M.D.   On: 12/13/2014 18:13   Dg Chest Port 1 View  12/15/2014   CLINICAL DATA:  Followup exam.  Pericardial effusion.  EXAM: PORTABLE CHEST - 1 VIEW  COMPARISON:  12/14/2014.  FINDINGS: Cardiopericardial silhouette is enlarged but stable.  There  is increased opacity at the right lung base with unchanged opacity at the left lung base, likely combination of pleural effusions and atelectasis. No convincing pulmonary edema. No pneumothorax.  The 2 left-sided chest tubes are stable. Left internal jugular central venous line has its tip at the confluence of the left brachiocephalic vein and superior vena cava, also stable. Stable right anterior chest wall power Port-A-Cath.  IMPRESSION: 1. Increased opacity at the right lung base likely due to a combination pleural fluid and atelectasis. 2. No other change from the previous day's study. Cardiopericardial silhouette is unchanged in size.   Electronically Signed   By: Lajean Manes M.D.   On:  12/15/2014 08:12   Dg Chest Port 1 View  12/14/2014   CLINICAL DATA:  Pericardial window, pericardial tamponade and metastatic breast cancer  EXAM: PORTABLE CHEST - 1 VIEW  COMPARISON:  CT 12/13/2014, chest radiograph 12/13/2014  FINDINGS: Right-sided Port-A-Cath in place with tip over the right atrium. Left-sided chest tubes are noted. No pneumothorax. Interval decrease in now small left pleural effusion with persistent retrocardiac opacity. Hazy right lower lobe airspace opacity could indicate atelectasis or pleural fluid. Moderate enlargement of the cardiac silhouette reidentified. New left IJ central line noted with tip at the brachiocephalic/ SVC junction.  IMPRESSION: Decrease in now small left pleural effusion.  No pneumothorax.   Electronically Signed   By: Conchita Paris M.D.   On: 12/14/2014 10:12     Medications:     Scheduled Medications: . acetaminophen  1,000 mg Oral 4 times per day   Or  . acetaminophen (TYLENOL) oral liquid 160 mg/5 mL  1,000 mg Oral 4 times per day  . anastrozole  1 mg Oral Daily  . B-complex with vitamin C  1 tablet Oral Daily  . benzonatate  200 mg Oral TID  . bisacodyl  10 mg Oral Daily  . cholecalciferol  1,000 Units Oral Daily  . furosemide  40 mg Intravenous BID  . multivitamins with iron  1 tablet Oral Daily  . polyethylene glycol  17 g Oral BID  . potassium chloride  20 mEq Oral BID  . senna-docusate  2 tablet Oral QHS  . tobramycin-dexamethasone  1 drop Left Eye Q4H while awake    Infusions: . dextrose 5 % and 0.45 % NaCl with KCl 20 mEq/L 100 mL/hr at 12/15/14 0252    PRN Medications: ALPRAZolam, fentaNYL (SUBLIMAZE) injection, HYDROcodone-homatropine, lidocaine-prilocaine, nitroGLYCERIN, ondansetron (ZOFRAN) IV, oxyCODONE, potassium chloride, prochlorperazine, traMADol, zolpidem   Assessment:   1.  Large hemorrhagic pericardial effusion with early tamponade    -s/p window 6/30 2. Large pleural effusion   --s/p chest tube 6/30 3.  Metastatic/recurrent breast CA 4. Acute diastolic HF with anasarca 5. Acute respiratory failure 6. Small RUL/RLL PE   Plan/Discussion:     Breathing improved after pericardiocentesis/thoracentesis and diuresis but still with significant volume overload. Continue IV lasix.   Now transitioning to Palliative Care. Have asked [facility] to assume care (thank you). We will stay on board and help with diuresis.  Given hemorrhagic pericardial effusion, not candidate for anticoagulation. Will use SCDs for now. Has decided against IVC filter.  Length of Stay: 2  Tawona Filsinger MD 12/15/2014, 1:35 PM  Advanced Heart Failure Team Pager [phone] (M-F; [place])  Please contact [facility] for night-coverage after hours (4p -7a ) and weekends on [email or web address]

## 2014-12-15 NOTE — Progress Notes (Signed)
Orders to remove foley and central line. Attempted to d/c both, pt did not want me to do so, stating that "the other doctor said he wanted them to stay in." Paged for clarification. [facility] MD on call ordered to keep foley in place for diuresis but remove central line. Communicated to night shift RN, Lavella Lemons.

## 2014-12-15 NOTE — Progress Notes (Signed)
PT - lymphedema  Checked on patient this pm. Noted increased swelling of left hand.  Removed bandages.  Asked nursing to move wristbands to Right UE and will return in am to re-wrap LUE, including hand.  Explained to patient and 2 sons.  12/15/2014 [place], [place]

## 2014-12-15 NOTE — Telephone Encounter (Signed)
[place] IN PT[facility]S HOSPITAL ROOM THIS MORNING DUE TO WORK. SHE WOULD LIKE AN UPDATE CONCERNING THIS MEETING FROM DR.MAGRINAT OR DR.MAGRINAT'S NURSE, VAL DODD,RN.

## 2014-12-15 NOTE — Telephone Encounter (Signed)
This RN spoke with Wells Guiles per her concerns- she states she spoke with [name] and her husband and understands decision for hospice and no further chemo.  Questions answered and support given regarding current plan of care.

## 2014-12-15 NOTE — Progress Notes (Signed)
ID: Linda [name]   DOB: [birth year]  MR#: [id number]  TZG#:[id number]  PCP: Leeanne Rio, PA-C GYN: SU: Coralie Keens OTHER MD: Crissie Reese, Linna Hoff Bensimhon  CHIEF COMPLAINT:  Hemorrhagic pericardial effusion  CURRENT TREATMENT: s/p window placement, CT in place  INTERVAL HISTORY: I met w Linda [name] and he 2 sons, Linda [name] and Linda [name], in her hospital room today. We discussed her diagnosis, treatment history, and prognosis. I gave them a written summary. We also discussed options from this point.  REVIEW OF SYSTEMS: Pat's pain is well controlled on current meds. She denies N/V. Denieas h/a, dizzyness, or visual changes. No BM in >8 days. Breathing is much better. L-CT draining serosanuinous fluid, as is the subxyphoid drain.  BREAST CANCER HISTORY: From the original intake note:  The patient noted a small amount of drainage from her left breast December of 2012. She brought it to her gynecologist's attention in January of 2013 and was set up for bilateral diagnostic mammography at the [facility] Center July 24, 2011. This was the patient's first ever mammogram. It showed a large irregular mass in the left retroareolar region extending to the nipple, with nipple retraction and skin thickening. This measured approximately 8.4 cm. It was associated with pleomorphic microcalcifications. By exam there was moderate distortion and retraction of the nipple with a large palpable ill-defined area in the retroareolar region. In the right right breast there was also a 2 cm hard mass palpated. Ultrasound of the right breast mass showed a complex cystic/solid area measuring 1.9 cm. Ultrasound of the right axilla was negative. Ultrasound of the left breast showed a large hypoechoic mass measuring at least 3.8 cm. The left axilla showed several adjacent abnormal appearing lymph nodes.  With this information biopsy of the right and left breast masses were obtained the same day, and showed ([id number])   (a) on  the right, and invasive ductal carcinoma with papillary features, estrogen and progesterone receptor positive, HER-2 negative, with an MIB-1 of 10%.   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 The MIB-1 was 60% for the left-sided tumor.   With this information the patient was presented at the multidisciplinary breast cancer conference 08/06/2011. Subsequent evaluation and treatments are as detailed below.   PAST MEDICAL HISTORY: Past Medical History  Diagnosis Date  . Cancer 08-13-11    07-31-11-Dx. Bilarteral Breast cancer-left greater than rt.  . Hematuria, undiagnosed cause 08-13-11    Being evaluated by [facility] 08-14-11  . Hypertension   . Bronchitis     hx  . GERD (gastroesophageal reflux disease)     doing well  . 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 Seroma 02/04/12    right breast  200cc removed  erythema on right side  . Seasonal allergies   . History of radiation therapy 02/20/12-04/15/12    left breast,total 60.4 Gy  . Wears partial dentures     wears upper and lower partial  . Allergy   . Blood transfusion without reported diagnosis   . 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   . Cancer of central portion of left female breast 08/01/2011  . S/P radiation therapy 10/09/14-10/27/14    whole brain 37.5Gy/70f  . Acute pulmonary embolism 12/13/2014    PAST  SURGICAL HISTORY: Past Surgical History  Procedure Laterality Date  . Child birth  08-13-11    x3 -NVD  . Portacath placement  08/15/2011    Procedure: INSERTION PORT-A-CATH;  Surgeon: Harl Bowie, MD;  Location: [facility];  Service: General;  Laterality: N/A;  . Mastectomy w/ sentinel node biopsy  01/21/2012    Procedure: MASTECTOMY WITH SENTINEL LYMPH  NODE BIOPSY;  Surgeon: Harl Bowie, MD;  Location: [place];  Service: General;  Laterality: Bilateral;  Left modified radical mastectomy, Rt mastectomy with Sentinel lymphnode biospy  . Breast surgery    . Port-a-cath removal Right 12/22/2012    Procedure: REMOVAL PORT-A-CATH;  Surgeon: Harl Bowie, MD;  Location: [place];  Service: General;  Laterality: Right;    FAMILY HISTORY Family History  Problem Relation Age of Onset  . Heart disease Mother   . Cancer Mother 2    breast, , 49 deceased  . Heart attack Father   . Cancer Sister 24    breast  . Colon cancer Neg Hx   The patient's father died from a heart attack at the age of 68. The patient's mother died from apparently heart problems at the age of 67. The patient had no brothers. She has 3 sisters. One of her sisters was diagnosed with breast cancer in her mid 47s. The patient does not know if his sister was ever genetically tested. The patient's mother had a mastectomy at the age of 73, presumably for breast cancer. There is no other history of breast or in cancer in the family to her knowledge.  GYNECOLOGIC HISTORY:  She does not recall when she had menarche. She had her first child at age 13. She is GX P3. She underwent menopause in her mid 62s. She never took hormone replacement.   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 She moved to [place] about 2 years ago but has a Games developer in [place]. Son Linda [name] lives in [place] and works as a Airline pilot. His wife is a Marine scientist. Son Linda [name] lives at [place] and is a Advertising account executive in addition to having the [employer]. The patient attends a local [employer] here   ADVANCED DIRECTIVES: Discussed 12/15/2014, with both her sons present. The patient is very clear she does not want to be resuscitated in case  of a terminal event.  HEALTH MAINTENANCE: History  Substance Use Topics  . Smoking status: Never Smoker   . Smokeless tobacco: Never Used  . Alcohol Use: 0.0 oz/week    0 Standard drinks or equivalent per week     Comment: rare- occ.     Colonoscopy: Never  PAP: Feb 2013  Bone density: November 2013, normal  Lipid panel:   Allergies  Allergen Reactions  . Ace Inhibitors Cough    Current Facility-Administered Medications  Medication Dose Route Frequency Provider Last Rate Last Dose  . acetaminophen (TYLENOL) tablet 1,000 mg  1,000 mg Oral 4 times per day Coolidge Breeze, PA-C   1,000 mg at 12/15/14 0543   Or  . acetaminophen (TYLENOL) solution 1,000 mg  1,000 mg Oral 4 times per day Coolidge Breeze, PA-C      . ALPRAZolam (XANAX) tablet 0.25 mg  0.25 mg Oral BID PRN Rhonda G Barrett, PA-C      . anastrozole (ARIMIDEX)  tablet 1 mg  1 mg Oral Daily Rhonda G Barrett, PA-C      . B-complex with vitamin C tablet 1 tablet  1 tablet Oral Daily Shaune Pascal Bensimhon, MD      . benzonatate (TESSALON) capsule 200 mg  200 mg Oral TID Evelene Croon Barrett, PA-C   200 mg at 12/14/14 2258  . bisacodyl (DULCOLAX) EC tablet 10 mg  10 mg Oral Daily Coolidge Breeze, PA-C   10 mg at 12/14/14 1400  . calcium-vitamin D (OSCAL WITH D) 500-200 MG-UNIT per tablet 1 tablet  1 tablet Oral BID Lonn Georgia, PA-C   1 tablet at 12/14/14 2258  . cefUROXime (ZINACEF) 1.5 g in dextrose 5 % 50 mL IVPB  1.5 g Intravenous Q12H Coolidge Breeze, PA-C   1.5 g at 12/14/14 2026  . cholecalciferol (VITAMIN D) tablet 1,000 Units  1,000 Units Oral Daily Rhonda G Barrett, PA-C      . dextrose 5 % and 0.45 % NaCl with KCl 20 mEq/L infusion   Intravenous Continuous Coolidge Breeze, PA-C 100 mL/hr at 12/15/14 0252    . fentaNYL (SUBLIMAZE) injection 25-50 mcg  25-50 mcg Intravenous Q2H PRN Coolidge Breeze, PA-C   50 mcg at 12/15/14 0414  . furosemide (LASIX) injection 40 mg  40 mg Intravenous BID Jolaine Artist, MD   40 mg at  12/14/14 1800  . HYDROcodone-homatropine (HYCODAN) 5-1.5 MG/5ML syrup 5 mL  5 mL Oral Q6H PRN Evelene Croon Barrett, PA-C   5 mL at 12/14/14 0146  . lidocaine-prilocaine (EMLA) cream 1 application  1 application Topical PRN Rhonda G Barrett, PA-C      . multivitamins with iron tablet 1 tablet  1 tablet Oral Daily Rhonda G Barrett, PA-C      . nitroGLYCERIN (NITROSTAT) SL tablet 0.4 mg  0.4 mg Sublingual Q5 Min x 3 PRN Rhonda G Barrett, PA-C      . ondansetron (ZOFRAN) injection 4 mg  4 mg Intravenous Q6H PRN Coolidge Breeze, PA-C   4 mg at 12/15/14 0419  . oxyCODONE (Oxy IR/ROXICODONE) immediate release tablet 5-10 mg  5-10 mg Oral Q4H PRN Coolidge Breeze, PA-C   10 mg at 12/15/14 0645  . potassium chloride (K-DUR) CR tablet 20 mEq  20 mEq Oral BID Jolaine Artist, MD   20 mEq at 12/14/14 2026  . potassium chloride 10 mEq in 50 mL *CENTRAL LINE* IVPB  10 mEq Intravenous Daily PRN Coolidge Breeze, PA-C      . prochlorperazine (COMPAZINE) tablet 10 mg  10 mg Oral Q6H PRN Rhonda G Barrett, PA-C      . senna-docusate (Senokot-S) tablet 1 tablet  1 tablet Oral QHS Coolidge Breeze, PA-C   1 tablet at 12/14/14 2258  . tobramycin-dexamethasone (TOBRADEX) ophthalmic suspension 1 drop  1 drop Left Eye Q4H while awake Rhonda G Barrett, PA-C   1 drop at 12/15/14 0544  . traMADol (ULTRAM) tablet 50-100 mg  50-100 mg Oral Q6H PRN Coolidge Breeze, PA-C      . zolpidem (AMBIEN) tablet 5 mg  5 mg Oral QHS PRN Rhonda G Barrett, PA-C        OBJECTIVE: [name] examined in a chair by bedside  Filed Vitals:   12/15/14 0442  BP: 127/64  Pulse:   Temp:   Resp: 20     Body mass index is 39.11 kg/(m^2).    ECOG FS: 2 Filed Weights  12/13/14 1609 12/14/14 0144 12/15/14 0442  Weight: 268 lb 1.3 oz (121.6 kg) 267 lb 6.7 oz (121.3 kg) 264 lb 15.9 oz (120.2 kg)   Sclerae unicteric, EOMs intact, pupils round and equal No cervical or supraclavicular adenopathy Lungs : L CT in place, no wheezes Heart regular  rate and rhythm Abd soft, obese, nontender, positive bowel sounds MSK grade 2 LUE lymphedema Neuro: nonfocal, well oriented x3, positiveaffect Breasts: Deferred   LAB RESULTS: Lab Results  Component Value Date   WBC 7.7 12/15/2014   NEUTROABS 6.2 12/13/2014   HGB 10.3* 12/15/2014   HCT 31.9* 12/15/2014   MCV 93.0 12/15/2014   PLT 232 12/15/2014      Chemistry      Component Value Date/Time   NA 138 12/15/2014 0420   NA 137 12/13/2014 0924   K 4.3 12/15/2014 0420   K 4.2 12/13/2014 0924   CL 105 12/15/2014 0420   CL 107 11/01/2012 1045   CO2 28 12/15/2014 0420   CO2 21* 12/13/2014 0924   BUN 22* 12/15/2014 0420   BUN 32.1* 12/13/2014 0924   CREATININE 0.89 12/15/2014 0420   CREATININE 1.1 12/13/2014 0924   CREATININE 0.61 06/30/2013 0959      Component Value Date/Time   CALCIUM 7.7* 12/15/2014 0420   CALCIUM 9.2 12/13/2014 0924   ALKPHOS 70 12/14/2014 0242   ALKPHOS 79 12/13/2014 0924   AST 31 12/14/2014 0242   AST 20 12/13/2014 0924   ALT 30 12/14/2014 0242   ALT 27 12/13/2014 0924   BILITOT 0.5 12/14/2014 0242   BILITOT 0.51 12/13/2014 0924       STUDIES: Dg Chest 1 View  12/13/2014   CLINICAL DATA:  69 year old female with history of breast cancer and shortness of breath.  EXAM: CHEST  1 VIEW  COMPARISON:  Chest radiograph dated 12/09/2014.  FINDINGS: Right IJ central Port-A-Cath with tip at atrial caval junction in stable positioning. There has been interval increase in the left lower lung field opacity likely combination of pleural effusion with associated compressive atelectasis of the adjacent lung. Superimposed pneumonia is not excluded. Small right pleural effusion. No pneumothorax. Stable cardiac silhouette. Grossly unremarkable osseous structures. Left axillary surgical clips.  IMPRESSION: Interval increase in the left pleural effusion with consolidative changes of the adjacent lung likely atelectasis/ pneumonia. Clinical correlation and follow-up  recommended.   Electronically Signed   By: Anner Crete M.D.   On: 12/13/2014 17:36   Dg Chest 1 View  12/09/2014   CLINICAL DATA:  Pleural effusion.  Post left-sided thoracentesis.  EXAM: CHEST  1 VIEW  COMPARISON:  Chest radiograph- 12/08/2014  FINDINGS: Interval reduction in persistent small left-sided effusion post thoracentesis. No pneumothorax.  Grossly unchanged enlarged cardiac silhouette and mediastinal contours given persistently reduced lung volumes. Stable position of support apparatus.  Improved aeration of the left lung base with persistent bibasilar opacities, left greater than right. No new focal airspace opacities.  Surgical clips overlie the left axilla. Osseous structures appear normal.  IMPRESSION: 1. Interval reduction in persistent small left-sided effusion post thoracentesis. No pneumothorax. 2. Improved aeration the left lung base with residual bibasilar opacities, left greater than right, likely atelectasis.   Electronically Signed   By: Sandi Mariscal M.D.   On: 12/09/2014 11:39   Dg Chest 2 View  11/29/2014   CLINICAL DATA:  Breast cancer  EXAM: CHEST  2 VIEW  COMPARISON:  09/11/2014  FINDINGS: Small left pleural effusion shows mild interval improvement. Mild left lower  lobe atelectasis also improved. Minimal pleural effusion on the right.  Cardiac enlargement. Pericardial effusion noted on prior CT. Negative for heart failure or edema. Port-A-Cath tip in the lower SVC.  IMPRESSION: Mild improvement in left pleural effusion and left lower lobe atelectasis. Minimal right pleural effusion.  Cardiac enlargement consistent with pericardial effusion.   Electronically Signed   By: Franchot Gallo M.D.   On: 11/29/2014 15:58   Ct Angio Chest Pe W/cm &/or Wo Cm  12/13/2014   CLINICAL DATA:  Shortness of breath and coughing. Worsening swelling of left arm. Breast cancer diagnosed 2/13 with right mastectomy and left modified radical mastectomy. Radiation therapy. Last chemotherapy 4/16.   EXAM: CT ANGIOGRAPHY CHEST WITH CONTRAST  TECHNIQUE: Multidetector CT imaging of the chest was performed using the standard protocol during bolus administration of intravenous contrast. Multiplanar CT image reconstructions and MIPs were obtained to evaluate the vascular anatomy.  CONTRAST:  51m OMNIPAQUE IOHEXOL 350 MG/ML SOLN  COMPARISON:  Plain film 12/13/2014.  Chest CT of 09/11/2014  FINDINGS: Mediastinum/Nodes: The quality of this exam for evaluation of pulmonary embolism is sufficient. Relatively small volume but definite pulmonary emboli. Examples within a right upper lobe segmental branch in image 76 of series 6 and a right lower lobe subsegmental branch on image 157 of series 6.  No supraclavicular adenopathy. A right Port-A-Cath which terminates at the mid to high at right atrium. Normal heart size. Moderate to large pericardial effusion, increased. Multivessel coronary artery atherosclerosis. Lad coronary artery atherosclerosis on image 44. Elevated right heart pressures, with reflux of contrast into the IVC and hepatic veins.  Resolution of previously described mediastinal adenopathy. No hilar adenopathy. No internal mammary adenopathy. Left axillary node dissection, without axillary adenopathy. Bilateral mastectomy.  Lungs/Pleura: Moderate left and small right pleural effusions, both enlarged since the prior CT. Motion and patient body habitus degradation. Right base subsegmental atelectasis.  Subpleural radiation fibrosis within the left upper lobe. Collapse/consolidative change in the left lower lobe. Improved since 09/11/2014.  Upper abdomen: Normal imaged portions of the liver, spleen, stomach, adrenal glands. Trace perihepatic ascites.  Musculoskeletal: New heterogeneous sclerosis, consistent with osseous metastasis.  Review of the MIP images confirms the above findings.  IMPRESSION: 1. Small volume pulmonary emboli. These results were called by telephone at the time of interpretation on 12/13/2014  at 6:09 pm to covering nurse, KMelvenia Needles who verbally acknowledged these results. 2. Enlargement of bilateral pleural effusions and a now large pericardial effusion. suggestion of elevated right heart pressures. 3.  Atherosclerosis, including within the coronary arteries. 4. Left base aeration which is improved. Residual collapse/consolidative changes could represent atelectasis and/or infection. 5. Resolution of mediastinal and hilar adenopathy. 6. Development of osseous metastasis. 7. Trace perihepatic ascites.   Electronically Signed   By: KAbigail MiyamotoM.D.   On: 12/13/2014 18:13   Dg Chest Port 1 View  12/14/2014   CLINICAL DATA:  Pericardial window, pericardial tamponade and metastatic breast cancer  EXAM: PORTABLE CHEST - 1 VIEW  COMPARISON:  CT 12/13/2014, chest radiograph 12/13/2014  FINDINGS: Right-sided Port-A-Cath in place with tip over the right atrium. Left-sided chest tubes are noted. No pneumothorax. Interval decrease in now small left pleural effusion with persistent retrocardiac opacity. Hazy right lower lobe airspace opacity could indicate atelectasis or pleural fluid. Moderate enlargement of the cardiac silhouette reidentified. New left IJ central line noted with tip at the brachiocephalic/ SVC junction.  IMPRESSION: Decrease in now small left pleural effusion.  No pneumothorax.   Electronically Signed  By: Conchita Paris M.D.   On: 12/14/2014 10:12   Dg Chest Port 1 View  12/08/2014   CLINICAL DATA:  Productive cough x1 month, history of pneumonia and breast cancer  EXAM: PORTABLE CHEST - 1 VIEW  COMPARISON:  11/29/2014  FINDINGS: Moderate to large left pleural effusion, increased.  Increased interstitial markings in the left upper lobe with associated subpleural scarring, grossly unchanged from prior CT.  Right lung is essentially clear.  Cardiomegaly.  Right chest port terminates at the cavoatrial junction.  IMPRESSION: Moderate to large left pleural effusion, increased.  Chronic changes in  the left upper lobe, unchanged from prior CT.   Electronically Signed   By: Julian Hy M.D.   On: 12/08/2014 18:03   US Thoracentesis Asp Pleural Space W/img Guide  12/09/2014   INDICATION: Symptomatic left-sided sided pleural effusion  EXAM: US THORACENTESIS ASP PLEURAL SPACE W/IMG GUIDE  COMPARISON:  None.  MEDICATIONS: 10 cc 1% lidocaine  COMPLICATIONS: None immediate  TECHNIQUE: Informed written consent was obtained from the patient after a discussion of the risks, benefits and alternatives to treatment. A timeout was performed prior to the initiation of the procedure.  Initial ultrasound scanning demonstrates a left pleural effusion. The lower chest was prepped and draped in the usual sterile fashion. 1% lidocaine was used for local anesthesia. Under direct ultrasound guidance, a 19 gauge, 7-cm, Yueh catheter was introduced. An ultrasound image was saved for documentation purposes. The thoracentesis was performed. The catheter was removed and a dressing was applied. The patient tolerated the procedure well without immediate post procedural complication. The patient was escorted to have an upright chest radiograph.  FINDINGS: A total of approximately 750 cc of yellow fluid was removed. Requested samples were sent to the laboratory.  IMPRESSION: Successful ultrasound-guided L sided thoracentesis yielding 750 cc of pleural fluid.  Read by:  Lavonia Drafts [facility]   Electronically Signed   By: Sandi Mariscal M.D.   On: 12/09/2014 11:30     ASSESSMENT: 69 y.o.  [place] woman   (1)  status post bilateral breast biopsies 07/24/2011, showing,      (a) on the right, a clinical T2 N0, stage IIA papillary/ductal breast cancer, estrogen and progesterone receptor positive, HER-2 negative, with an MIB-1 of 10%;     (b) on the left, a clinical T3 N1, stage IIIA invasive ductal carcinoma, grade 3, triple positive, with an MIB-1 of 60%.  (2)  Status post 4 dose dense cycles of doxorubicin/ cyclophosphamide  followed by 4 dose dense cycles of paclitaxel and trastuzumab completed 12/09/2011  (3) the trastuzumab was continued for a total of one year (to 11/01/2012). Final echo on 11/04/2012 showing a well preserved ejection fraction of 55-60%.  (4) s/p bilateral mastectomies 01/21/2012 showing   (a) on the Right, an 8 mm invasive papillary carcinoma, grade 1, ypT1b ypN0   (b) on the Left, multiple microscopic foci of residual  Invasive ductal carcinoma with evidence of dermal lymphatic involvement, pyT1a/T4 pyN0  (5)  Postmastectomy radiation, completed 04/15/2012  (6) Started anastrazole 04/16/2012; normal dexa scan 05/19/2014 at the [facility] 09/11/2014 (7) CT angiogram 09/11/2014 shows new left pericardial effusion and new mediastinal and hilar lymphadenopathy; there were no suspicious upper abdominal findings; Cytology from the left effusion 09/11/2014 ([id number]) shows malignant cells which are HER-2 positive  (8) Whole body bone scan on 09/25/14 showed metastatic foci throughout axial and appendicular skeleton. Areas of most potential concern include disease in the  thoracic spine, disease in the right acetabulum, right superior pubic ramus and possibly proximal right femur, disease in the right humeral shaft and in both femoral shafts.  (8) Started trastuzumab/pertuzumab 09/26/2014, to be repeated every 21 days indefinitely  (a) echocardiogram 12/13/2014 shows a well preserved ejection fraction.  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 They understand she has stage IV breast cancer, which is not curable with today's knowledge base. Her cancer "eats" estrogen and HER-2 and she has been treated with anti-estrogens and anti-HER-2 treatments. The recent CT showed some evidence of response (decreased adenopathy) but also significant disease progression, including a hemorrhagic pericardial effusion. There was also evidence of PEs.  The fact that the tumor grew while on dual therapy indicates the cancer cells are "eating" something else, which we cannot identify or block. Accordingly, if we wanted to aggressively treat this cancer we would have to add chemotherapy to the current tretments. We would also have to consider an IVC filter--we discussed the pros and cons and possible complications of that procedure longer-term. We would also have to do a brain MRI and see if there is recurrence in the brain, which would mean treatment of the peripheral disease would do little than add toxicity.  The alternative is to move to a palliative mode under Hospice care. We discussed that option in detail as well.  Linda [name] is well-oriented, not at all overmedicated, and very much able to make this decision. She clearly opts for a palliative approach. Her children are in agreement. That is my recommendation as well.  Accordingly we will not place an IVC filter. We will not obtain a brain MRI in the absence of symptoms. We will not add chemotherapy and we will cancer anti-HER-2 therapy, which is not controlling the cancer. We will continue the anastrozole, which is not causing the patient any problems. We will rehab the LUE lymphedema. We will place a Hospice referral so they can provide the patient optimal home support after discharge. We will work aggressively on symptom management (shortness of breath, pain,  constipation, etc.).   Linda [name] should not be resuscitated in case of a terminal event-- order is in place.  I greatly appreciate cardiology and thoracic surgery's help to this patient! If You feel pleurodesis at the time of chest tube removal would be helpful, the patient is agreeable.   Chauncey Cruel, MD   12/15/2014

## 2014-12-15 NOTE — Consult Note (Signed)
Medical Consultation   Linda [name]  KXF:[id number]  DOB: [birth year]  DOA: 12/13/2014  PCP: Leeanne Rio, PA-C  Requesting physician: Dr. Haroldine Laws  Reason for consultation: Medical management and assume care  History of Present Illness: Patient is a 69 year old female with metastatic breast cancer status post bilateral mastectomies in 2013, GERD, hypertension presented for a scheduled follow-up echocardiogram and was found to have large pericardial effusion. Patient did endorse having orthopnea, shortness of breath, significant weight gain in the last month. She also reported chest pain on coughing, generalized weakness and fatigue. Patient also reported shortness of breath on minimal exertion. Patient was admitted by cardiology service and cardiothoracic surgery was consulted. Patient underwent pericardial window with left-sided chest tube insertion on 6/30 intraoperatively, 800 mL of hemorrhagic fluid was drained from the pericardium. Patient underwent CT angiogram of the chest on 6/29 which showed pulmonary embolism in the right lung and metastatic disease.  Vascular surgery was consulted regarding anticoagulation and IVC filter. Oncology was consulted, patient was seen by Dr Jana Hakim who spoke at length with the patient's family and patient. Patient at this time decided to pursue palliative care. Oncology recommended now IVC filter or brain MRI at this time, no additional chemotherapy, continue anastrozole. During my encounter, patient reports that she is feeling some work more comfortable from her breathing standpoint now. Patient's son at the bedside. No chest pain at this time. Her only complaint is constipation, last bowel movement last week.   Allergies:   Allergies  Allergen Reactions  . Ace Inhibitors Cough      Past Medical History  Diagnosis Date  . Cancer 08-13-11    07-31-11-Dx. Bilarteral Breast cancer-left greater than rt.  . Hematuria, undiagnosed cause  08-13-11    Being evaluated by [facility] 08-14-11  . Hypertension   . Bronchitis     hx  . GERD (gastroesophageal reflux disease)     doing well  . 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 Seroma 02/04/12    right breast  200cc removed  erythema on right side  . Seasonal allergies   . History of radiation therapy 02/20/12-04/15/12    left breast,total 60.4 Gy  . Wears partial dentures     wears upper and lower partial  . Allergy   . Blood transfusion without reported diagnosis   . 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   . Cancer of central portion of left female breast 08/01/2011  . S/P radiation therapy 10/09/14-10/27/14    whole brain 37.5Gy/64fx  . Acute pulmonary embolism 12/13/2014    Past Surgical History  Procedure Laterality Date  . Child birth  08-13-11    x3 -NVD  . Portacath placement  08/15/2011    Procedure: INSERTION PORT-A-CATH;  Surgeon: Harl Bowie, MD;  Location: [facility];  Service: General;  Laterality: N/A;  . Mastectomy w/ sentinel node biopsy  01/21/2012    Procedure: MASTECTOMY WITH SENTINEL LYMPH NODE BIOPSY;  Surgeon: Harl Bowie, MD;  Location: [place];  Service: General;  Laterality: Bilateral;  Left modified radical mastectomy, Rt mastectomy with Sentinel lymphnode biospy  . Breast surgery    . Port-a-cath removal Right 12/22/2012    Procedure: REMOVAL PORT-A-CATH;  Surgeon: Harl Bowie, MD;  Location: [place];  Service: General;  Laterality: Right;  .  Subxyphoid pericardial window N/A 12/14/2014    Procedure: SUBXYPHOID PERICARDIAL WINDOW;  Surgeon: Rexene Alberts, MD;  Location: [place];  Service: Thoracic;  Laterality: N/A;  . Tee without cardioversion N/A 12/14/2014    Procedure: TRANSESOPHAGEAL ECHOCARDIOGRAM (TEE);  Surgeon: Rexene Alberts, MD;   Location: [place];  Service: Thoracic;  Laterality: N/A;  . Chest tube insertion Left 12/14/2014    Procedure: CHEST TUBE INSERTION;  Surgeon: Rexene Alberts, MD;  Location: [place];  Service: Thoracic;  Laterality: Left;    Social History:  reports that she has never smoked. She has never used smokeless tobacco. She reports that she drinks alcohol. She reports that she does not use illicit drugs.  Family History  Problem Relation Age of Onset  . Heart disease Mother   . Cancer Mother 47    breast, , 36 deceased  . Heart attack Father   . Cancer Sister 57    breast  . Colon cancer Neg Hx     Review of Systems:  Review of Systems:  Constitutional: Denies fever, chills, diaphoresis, appetite change and fatigue.  HEENT: Denies photophobia, eye pain, redness, hearing loss, ear pain, congestion, sore throat, rhinorrhea, sneezing, mouth sores, trouble swallowing, neck pain, neck stiffness and tinnitus.   Respiratory: Please see history of present illness  Cardiovascular: Denies chest pain, palpitations and leg swelling.  Gastrointestinal: Denies nausea, vomiting, abdominal pain,  constipation, blood in stool and abdominal distention.  Genitourinary: Denies dysuria, urgency, frequency, hematuria, flank pain and difficulty urinating.  Musculoskeletal: Denies myalgias, back pain, joint swelling, arthralgias and gait problem.  Skin: Denies pallor, rash and wound.  Neurological: Denies dizziness, seizures, syncope, weakness, light-headedness, numbness and headaches.  Hematological: Denies adenopathy. Easy bruising, personal or family bleeding history  Psychiatric/Behavioral: Denies suicidal ideation, mood changes, confusion, nervousness, sleep disturbance and agitation   Physical Exam: Blood pressure 108/54, pulse 93, temperature 97.9 F (36.6 C), temperature source Oral, resp. rate 18, height 5\' 9"  (1.753 m), weight 120.2 kg (264 lb 15.9 oz), SpO2 92 %.  General: Alert and awake, oriented x3,  not in any acute distress, ill-appearing. HEENT: normocephalic, atraumatic, anicteric sclera, pupils reactive to light and accommodation, EOMI, oropharynx clear CVS: S1-S2 clear, no murmur rubs or gallops Chest: Decreased breath sounds overall Abdomen: soft nontender, nondistended, normal bowel sounds, no organomegaly Extremities: no cyanosis, clubbing, 2-3+ edema noted bilaterally lower extremity, left upper extremity lymphedema Neuro: Cranial nerves II-XII intact, no focal neurological deficits Psych: alert and oriented, stable mood and affect Skin: no rashes or lesions  Labs on Admission:  Basic Metabolic Panel:  Recent Labs Lab 12/14/14 0242 12/15/14 0420  NA 135 138  K 4.1 4.3  CL 102 105  CO2 24 28  GLUCOSE 140* 130*  BUN 34* 22*  CREATININE 1.12* 0.89  CALCIUM 9.0 7.7*   Liver Function Tests:  Recent Labs Lab 12/13/14 0924 12/14/14 0242  AST 20 31  ALT 27 30  ALKPHOS 79 70  BILITOT 0.51 0.5  PROT 6.6 6.9  ALBUMIN 3.4* 3.4*   No results for input(s): LIPASE, AMYLASE in the last 168 hours. No results for input(s): AMMONIA in the last 168 hours. CBC:  Recent Labs Lab 12/13/14 0923 12/15/14 0420  WBC 8.3 7.7  NEUTROABS 6.2  --   HGB 10.7* 10.3*  HCT 32.8* 31.9*  MCV 92.4 93.0  PLT 265 232   Cardiac Enzymes:  Recent Labs Lab 12/08/14 1758  TROPONINI <0.03   BNP: Invalid input(s): POCBNP CBG:  No results for input(s): GLUCAP in the last 168 hours.  Inpatient Medications:   Scheduled Meds: . acetaminophen  1,000 mg Oral 4 times per day   Or  . acetaminophen (TYLENOL) oral liquid 160 mg/5 mL  1,000 mg Oral 4 times per day  . anastrozole  1 mg Oral Daily  . B-complex with vitamin C  1 tablet Oral Daily  . benzonatate  200 mg Oral TID  . bisacodyl  10 mg Oral Daily  . cholecalciferol  1,000 Units Oral Daily  . furosemide  40 mg Intravenous BID  . multivitamins with iron  1 tablet Oral Daily  . polyethylene glycol  17 g Oral Daily  . potassium  chloride  20 mEq Oral BID  . senna-docusate  2 tablet Oral QHS  . tobramycin-dexamethasone  1 drop Left Eye Q4H while awake   Continuous Infusions: . dextrose 5 % and 0.45 % NaCl with KCl 20 mEq/L 100 mL/hr at 12/15/14 0252     Radiological Exams on Admission: Dg Chest 1 View  12/13/2014   CLINICAL DATA:  69 year old female with history of breast cancer and shortness of breath.  EXAM: CHEST  1 VIEW  COMPARISON:  Chest radiograph dated 12/09/2014.  FINDINGS: Right IJ central Port-A-Cath with tip at atrial caval junction in stable positioning. There has been interval increase in the left lower lung field opacity likely combination of pleural effusion with associated compressive atelectasis of the adjacent lung. Superimposed pneumonia is not excluded. Small right pleural effusion. No pneumothorax. Stable cardiac silhouette. Grossly unremarkable osseous structures. Left axillary surgical clips.  IMPRESSION: Interval increase in the left pleural effusion with consolidative changes of the adjacent lung likely atelectasis/ pneumonia. Clinical correlation and follow-up recommended.   Electronically Signed   By: Anner Crete M.D.   On: 12/13/2014 17:36   Ct Angio Chest Pe W/cm &/or Wo Cm  12/13/2014   CLINICAL DATA:  Shortness of breath and coughing. Worsening swelling of left arm. Breast cancer diagnosed 2/13 with right mastectomy and left modified radical mastectomy. Radiation therapy. Last chemotherapy 4/16.  EXAM: CT ANGIOGRAPHY CHEST WITH CONTRAST  TECHNIQUE: Multidetector CT imaging of the chest was performed using the standard protocol during bolus administration of intravenous contrast. Multiplanar CT image reconstructions and MIPs were obtained to evaluate the vascular anatomy.  CONTRAST:  20mL OMNIPAQUE IOHEXOL 350 MG/ML SOLN  COMPARISON:  Plain film 12/13/2014.  Chest CT of 09/11/2014  FINDINGS: Mediastinum/Nodes: The quality of this exam for evaluation of pulmonary embolism is sufficient.  Relatively small volume but definite pulmonary emboli. Examples within a right upper lobe segmental branch in image 76 of series 6 and a right lower lobe subsegmental branch on image 157 of series 6.  No supraclavicular adenopathy. A right Port-A-Cath which terminates at the mid to high at right atrium. Normal heart size. Moderate to large pericardial effusion, increased. Multivessel coronary artery atherosclerosis. Lad coronary artery atherosclerosis on image 44. Elevated right heart pressures, with reflux of contrast into the IVC and hepatic veins.  Resolution of previously described mediastinal adenopathy. No hilar adenopathy. No internal mammary adenopathy. Left axillary node dissection, without axillary adenopathy. Bilateral mastectomy.  Lungs/Pleura: Moderate left and small right pleural effusions, both enlarged since the prior CT. Motion and patient body habitus degradation. Right base subsegmental atelectasis.  Subpleural radiation fibrosis within the left upper lobe. Collapse/consolidative change in the left lower lobe. Improved since 09/11/2014.  Upper abdomen: Normal imaged portions of the liver, spleen, stomach, adrenal glands. Trace perihepatic ascites.  Musculoskeletal: New heterogeneous sclerosis, consistent with osseous metastasis.  Review of the MIP images confirms the above findings.  IMPRESSION: 1. Small volume pulmonary emboli. These results were called by telephone at the time of interpretation on 12/13/2014 at 6:09 pm to covering nurse, Melvenia Needles, who verbally acknowledged these results. 2. Enlargement of bilateral pleural effusions and a now large pericardial effusion. suggestion of elevated right heart pressures. 3.  Atherosclerosis, including within the coronary arteries. 4. Left base aeration which is improved. Residual collapse/consolidative changes could represent atelectasis and/or infection. 5. Resolution of mediastinal and hilar adenopathy. 6. Development of osseous metastasis. 7. Trace  perihepatic ascites.   Electronically Signed   By: Abigail Miyamoto M.D.   On: 12/13/2014 18:13   Dg Chest Port 1 View  12/15/2014   CLINICAL DATA:  Followup exam.  Pericardial effusion.  EXAM: PORTABLE CHEST - 1 VIEW  COMPARISON:  12/14/2014.  FINDINGS: Cardiopericardial silhouette is enlarged but stable.  There is increased opacity at the right lung base with unchanged opacity at the left lung base, likely combination of pleural effusions and atelectasis. No convincing pulmonary edema. No pneumothorax.  The 2 left-sided chest tubes are stable. Left internal jugular central venous line has its tip at the confluence of the left brachiocephalic vein and superior vena cava, also stable. Stable right anterior chest wall power Port-A-Cath.  IMPRESSION: 1. Increased opacity at the right lung base likely due to a combination pleural fluid and atelectasis. 2. No other change from the previous day's study. Cardiopericardial silhouette is unchanged in size.   Electronically Signed   By: Lajean Manes M.D.   On: 12/15/2014 08:12   Dg Chest Port 1 View  12/14/2014   CLINICAL DATA:  Pericardial window, pericardial tamponade and metastatic breast cancer  EXAM: PORTABLE CHEST - 1 VIEW  COMPARISON:  CT 12/13/2014, chest radiograph 12/13/2014  FINDINGS: Right-sided Port-A-Cath in place with tip over the right atrium. Left-sided chest tubes are noted. No pneumothorax. Interval decrease in now small left pleural effusion with persistent retrocardiac opacity. Hazy right lower lobe airspace opacity could indicate atelectasis or pleural fluid. Moderate enlargement of the cardiac silhouette reidentified. New left IJ central line noted with tip at the brachiocephalic/ SVC junction.  IMPRESSION: Decrease in now small left pleural effusion.  No pneumothorax.   Electronically Signed   By: Conchita Paris M.D.   On: 12/14/2014 10:12    Impression/Recommendations Principal Problem:   Pericardial effusion with cardiac tamponade -  Patient's dyspnea has significantly improved after pericardiocentesis and thoracentesis, IV diureses being managed by cardiology service  Active Problems: Acute pulmonary embolism: Likely due to underlying metastatic breast cancer and hypercoagulable state - Given hemorrhagic pericardial effusion, she is not a candidate for anticoagulation.Oncology was consulted and recommended no IVC filter placement at this time.  Malignant pleural effusion - Status post thoracentesis, continue IV diuresis per cardiology, consider pleurodesis or pleurx cath due to malignancy - Cardiac thoracic surgery following  Hypertension - Currently stable    Bilateral metastatic breast cancer - Oncology following, recommended to continue anastrozole, no further addition of chemotherapy or brain MRI - Recommending hospice referral, placed consult  - Continue pain control  Constipation  - Placed on Dulcolax, MiraLAX, Senokot. May need enema.    [facility] medicine service will assume care in AM per Dr Bensimhon's request   Time Spent  60 mins   RAI,RIPUDEEP M.D. [facility] 12/15/2014, 11:07 AM

## 2014-12-15 NOTE — Progress Notes (Signed)
Pt refused to have CL removed.  States Dr. Haroldine Laws said it was going to stay in.  Pt states when Dr. Haroldine Laws tells her it can be removed then she will let it be removed.

## 2014-12-16 ENCOUNTER — Encounter (HOSPITAL_COMMUNITY): Payer: Self-pay | Admitting: Thoracic Surgery (Cardiothoracic Vascular Surgery)

## 2014-12-16 ENCOUNTER — Inpatient Hospital Stay (HOSPITAL_COMMUNITY): Payer: Medicare Other

## 2014-12-16 DIAGNOSIS — I5031 Acute diastolic (congestive) heart failure: Secondary | ICD-10-CM

## 2014-12-16 LAB — COMPREHENSIVE METABOLIC PANEL WITH GFR
ALT: 18 U/L (ref 14–54)
AST: 18 U/L (ref 15–41)
Albumin: 2.4 g/dL — ABNORMAL LOW (ref 3.5–5.0)
Alkaline Phosphatase: 60 U/L (ref 38–126)
Anion gap: 6 (ref 5–15)
BUN: 11 mg/dL (ref 6–20)
CO2: 30 mmol/L (ref 22–32)
Calcium: 7.7 mg/dL — ABNORMAL LOW (ref 8.9–10.3)
Chloride: 100 mmol/L — ABNORMAL LOW (ref 101–111)
Creatinine, Ser: 0.74 mg/dL (ref 0.44–1.00)
GFR calc Af Amer: >60 mL/min
GFR calc non Af Amer: >60 mL/min
Glucose, Bld: 122 mg/dL — ABNORMAL HIGH (ref 65–99)
Potassium: 4 mmol/L (ref 3.5–5.1)
Sodium: 136 mmol/L (ref 135–145)
Total Bilirubin: 0.9 mg/dL (ref 0.3–1.2)
Total Protein: 5 g/dL — ABNORMAL LOW (ref 6.5–8.1)

## 2014-12-16 LAB — CBC
HCT: 33.4 % — ABNORMAL LOW (ref 36.0–46.0)
Hemoglobin: 10.6 g/dL — ABNORMAL LOW (ref 12.0–15.0)
MCH: 30 pg (ref 26.0–34.0)
MCHC: 31.7 g/dL (ref 30.0–36.0)
MCV: 94.6 fL (ref 78.0–100.0)
Platelets: 217 10*3/uL (ref 150–400)
RBC: 3.53 MIL/uL — ABNORMAL LOW (ref 3.87–5.11)
RDW: 16.4 % — ABNORMAL HIGH (ref 11.5–15.5)
WBC: 8 10*3/uL (ref 4.0–10.5)

## 2014-12-16 MED ORDER — BISACODYL 10 MG RE SUPP
10.0000 mg | Freq: Once | RECTAL | Status: AC
  Administered 2014-12-16: 10 mg via RECTAL
  Filled 2014-12-16: qty 1

## 2014-12-16 NOTE — OR Nursing (Signed)
Late entry for sahara charge.

## 2014-12-16 NOTE — Progress Notes (Addendum)
      [address]       [place] [address]             [phone]       2 Days Post-Op Procedure(s) (LRB): SUBXYPHOID PERICARDIAL WINDOW (N/A) TRANSESOPHAGEAL ECHOCARDIOGRAM (TEE) (N/A) CHEST TUBE INSERTION (Left)  Subjective: Patient states breathing is "pretty good."  Objective: Vital signs in last 24 hours: Temp:  [97.8 F (36.6 C)-98.5 F (36.9 C)] 98.1 F (36.7 C) (07/02 0740) Pulse Rate:  [86-97] 86 (07/02 0743) Cardiac Rhythm:  [-] Normal sinus rhythm (07/02 0741) Resp:  [13-18] 17 (07/02 0743) BP: (105-124)/(54-66) 105/56 mmHg (07/02 0741) SpO2:  [92 %-99 %] 94 % (07/02 0743)      Intake/Output from previous day: 07/01 0701 - 07/02 0700 In: 1300 [I.V.:1300] Out: 3650 [Urine:3200; Chest Tube:450]   Physical Exam:  Cardiovascular: RRR. Pulmonary: Diminished at bases; no rales, wheezes, or rhonchi. Abdomen: Soft, non tender, bowel sounds present. Extremities: Bilateral lower extremity edema. Wounds: Subxiphoid wound is clean and dry.   Chest Tube and pericardial tube: to suction, no air leak from left chest tube  Lab Results: CBC: Recent Labs  12/15/14 0420 12/16/14 0620  WBC 7.7 8.0  HGB 10.3* 10.6*  HCT 31.9* 33.4*  PLT 232 217   BMET:  Recent Labs  12/15/14 0420 12/16/14 0620  NA 138 136  K 4.3 4.0  CL 105 100*  CO2 28 30  GLUCOSE 130* 122*  BUN 22* 11  CREATININE 0.89 0.74  CALCIUM 7.7* 7.7*    PT/INR:  Recent Labs  12/13/14 1703  LABPROT 16.4*  INR 1.31   ABG:  INR: Will add last result for INR, ABG once components are confirmed Will add last 4 CBG results once components are confirmed  Assessment/Plan:  1. CV - SR in the 80's. 2.  Pulmonary - On room air. Left chest tube with 270 cc last 12 hours and pericardial tube with 80 cc last 12 hours. Both tubes to remain to suction for now. CXR shows no pneumothorax, moderate cardiomegaly, small bilateral pleural effusions, and atelectasis on right. Encourage  incentive spirometer and flutter valve. 3. H and H stable at 10.6 and 33.4 4. Remove central line. Foley was to be removed but because she is being diuresed, will be left in until am.   ZIMMERMAN,DONIELLE MPA-C 12/16/2014,9:33 AM  Pericardial and left pleural drain continued to have decreasing but significant output  Histology of pericardial tissue negative for malignancy Cytology of pericardial fluid negative for malignant cells Abdomen slightly distended-Dulcolax suppository ordered patient examined and medical record reviewed,agree with above note. Tharon Aquas Trigt III 12/16/2014

## 2014-12-16 NOTE — Progress Notes (Signed)
[facility] TEAM 1 - Stepdown/ICU TEAM Progress Note  Linda [name]:[id number] DOB: 1946-05-01 DOA: 12/13/2014 PCP: Leeanne Rio, PA-C  Admit HPI / Brief Narrative: 69 year old female with metastatic breast cancer status post bilateral mastectomies in 2013, GERD, and hypertension who presented for a scheduled follow-up echocardiogram and was found to have a large pericardial effusion. Patient did endorse orthopnea, dyspnea, and significant weight gain over a month. She also reported chest pain on coughing, generalized weakness and fatigue.   Patient was admitted by the Cardiology service on 6/29 and TCTS was consulted. Patient underwent pericardial window on 6/30 w/ 800 mL of hemorrhagic fluid drained. CT angiogram of the chest on 6/29 showed pulmonary embolism in the right lung and metastatic disease.   Vascular Surgery was consulted regarding anticoagulation and IVC filter. Oncology was consulted and after an extended discussion with the patient she decided to pursue palliative care.   HPI/Subjective: The patient has no new complaints.  She states she continues to feel much better as diuresis is ongoing.  She denies significant shortness of breath this time.  She states her pain is well controlled.  She denies nausea vomiting or abdominal discomfort.  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 -s/p window 6/30 per TCTS - timing of tube/drain removal per TCTS  Malignant pleural effusion -S/P L chest tube per TCTS - tube care per TCTS - see their recs per tube care/suction, etc (possible need for Pleurx catheter)  Acute pulmonary emboli -given hemorrhagic pericardial effusion, she is not a candidate for anticoagulation - no IVC filter as pt to pursue palliative care    Hypertension -Currently stable  Recurrent widely metastatic breast cancer - Oncology recommended to continue anastrozole, no further addition of chemotherapy or brain  MRI - now to pursue palliative care   Anasarca - acute diastolic CHF  -remains significantly volume overloaded - cont IV lasix - CHF Team following   Constipation  - Placed on Dulcolax, MiraLAX, Senokot. May need enema.   Code Status: NO CODE - DNR  Family Communication: no family present at time of exam Disposition Plan: SDU  Consultants: TCTS Cardiology Oncology   Antibiotics: none  DVT prophylaxis: SCDs  Objective: Blood pressure 105/56, pulse 86, temperature 98.1 F (36.7 C), temperature source Oral, resp. rate 17, height 5\' 9"  (1.753 m), weight 120.2 kg (264 lb 15.9 oz), SpO2 94 %.  Intake/Output Summary (Last 24 hours) at 12/16/14 1510 Last data filed at 12/16/14 1400  Gross per 24 hour  Intake   1300 ml  Output   3355 ml  Net  -2055 ml   Exam: General: No acute respiratory distress - alert and pleasant  Lungs: Clear to auscultation bilaterally but w/ poor air movement in B bases in bed  Cardiovascular: Regular rate and rhythm without murmur gallop or rub  Abdomen: Nontender, protuberent, soft, bowel sounds positive, no rebound, no ascites, no appreciable mass Extremities: No significant cyanosis, or clubbing; 1+ anasarca   Data Reviewed: Basic Metabolic Panel:  Recent Labs Lab 12/13/14 0924 12/14/14 0242 12/15/14 0420 12/16/14 0620  NA 137 135 138 136  K 4.2 4.1 4.3 4.0  CL  --  102 105 100*  CO2 21* 24 28 30   GLUCOSE 127 140* 130* 122*  BUN 32.1* 34* 22* 11  CREATININE 1.1 1.12* 0.89 0.74  CALCIUM 9.2 9.0 7.7* 7.7*    CBC:  Recent Labs Lab 12/13/14 0923 12/15/14 0420 12/16/14 0620  WBC 8.3 7.7 8.0  NEUTROABS  6.2  --   --   HGB 10.7* 10.3* 10.6*  HCT 32.8* 31.9* 33.4*  MCV 92.4 93.0 94.6  PLT 265 232 217    Liver Function Tests:  Recent Labs Lab 12/13/14 0924 12/14/14 0242 12/16/14 0620  AST 20 31 18   ALT 27 30 18   ALKPHOS 79 70 60  BILITOT 0.51 0.5 0.9  PROT 6.6 6.9 5.0*  ALBUMIN 3.4* 3.4* 2.4*   Coags:  Recent  Labs Lab 12/13/14 1703  INR 1.31    Recent Labs Lab 12/13/14 1703  APTT 27    Recent Results (from the past 240 hour(s))  MRSA PCR Screening     Status: None   Collection Time: 12/13/14  4:20 PM  Result Value Ref Range Status   MRSA by PCR NEGATIVE NEGATIVE Final    Comment:        The GeneXpert MRSA Assay (FDA approved for NASAL specimens only), is one component of a comprehensive MRSA colonization surveillance program. It is not intended to diagnose MRSA infection nor to guide or monitor treatment for MRSA infections.   Culture, body fluid-bottle     Status: None (Preliminary result)   Collection Time: 12/14/14  8:01 AM  Result Value Ref Range Status   Specimen Description FLUID PERICARDIAL  Final   Special Requests   Final    BOTTLES DRAWN AEROBIC AND ANAEROBIC 10CC PATIENT ON FOLLOWING ZINACEF   Culture NO GROWTH 2 DAYS  Final   Report Status PENDING  Incomplete  Gram stain     Status: None   Collection Time: 12/14/14  8:01 AM  Result Value Ref Range Status   Specimen Description FLUID PERICARDIAL  Final   Special Requests PATIENT ON FOLLOWING ZINACEF  Final   Gram Stain   Final    MODERATE WBC PRESENT, PREDOMINANTLY MONONUCLEAR NO ORGANISMS SEEN    Report Status 12/14/2014 FINAL  Final  Culture, body fluid-bottle     Status: None (Preliminary result)   Collection Time: 12/14/14  8:05 AM  Result Value Ref Range Status   Specimen Description FLUID LEFT PLEURAL  Final   Special Requests PATIENT ON FOLLOWING ZINACEF  Final   Culture NO GROWTH 2 DAYS  Final   Report Status PENDING  Incomplete  Gram stain     Status: None   Collection Time: 12/14/14  8:05 AM  Result Value Ref Range Status   Specimen Description FLUID LEFT PLEURAL  Final   Special Requests PATIENT ON FOLLOWING ZINACEF  Final   Gram Stain   Final    MODERATE WBC PRESENT,BOTH PMN AND MONONUCLEAR NO ORGANISMS SEEN    Report Status 12/14/2014 FINAL  Final     Studies:   Recent x-ray  studies have been reviewed in detail by the Attending Physician  Scheduled Meds:  Scheduled Meds: . acetaminophen  1,000 mg Oral 4 times per day   Or  . acetaminophen (TYLENOL) oral liquid 160 mg/5 mL  1,000 mg Oral 4 times per day  . anastrozole  1 mg Oral Daily  . B-complex with vitamin C  1 tablet Oral Daily  . benzonatate  200 mg Oral TID  . bisacodyl  10 mg Oral Daily  . cholecalciferol  1,000 Units Oral Daily  . furosemide  40 mg Intravenous BID  . metolazone  5 mg Oral Daily  . multivitamins with iron  1 tablet Oral Daily  . polyethylene glycol  17 g Oral BID  . potassium chloride  20 mEq Oral BID  .  senna-docusate  2 tablet Oral QHS  . tobramycin-dexamethasone  1 drop Left Eye Q4H while awake    Time spent on care of this patient: 35 mins   Claris Pech T , MD   [facility] Office  [phone] Pager - Text Page per Amion as per below:  On-Call/Text Page:      [email or web address]      password [email or web address]  If 7PM-7AM, please contact night-coverage [email or web address] Password [email or web address] 12/16/2014, 3:10 PM   LOS: 3 days

## 2014-12-16 NOTE — Progress Notes (Signed)
Advanced Heart Failure Rounding Note   Subjective:    Continues to improve with diuresis. No weight on chart. I/O -2.2L   Objective:   Weight Range:  Vital Signs:   Temp:  [97.8 F (36.6 C)-98.5 F (36.9 C)] 98.1 F (36.7 C) (07/02 0740) Pulse Rate:  [86-97] 86 (07/02 0743) Resp:  [13-18] 17 (07/02 0743) BP: (105-124)/(54-66) 105/56 mmHg (07/02 0741) SpO2:  [92 %-99 %] 94 % (07/02 0743) Last BM Date: 12/08/14  Weight change: Filed Weights   12/13/14 1609 12/14/14 0144 12/15/14 0442  Weight: 121.6 kg (268 lb 1.3 oz) 121.3 kg (267 lb 6.7 oz) 120.2 kg (264 lb 15.9 oz)    Intake/Output:   Intake/Output Summary (Last 24 hours) at 12/16/14 1045 Last data filed at 12/16/14 1000  Gross per 24 hour  Intake   1400 ml  Output   3705 ml  Net  -2305 ml     Physical Exam: General:  Alopecic. Ill appearing HEENT: normal Neck: supple. JVP hard to see looks up  . Carotids 2+ bilat; no bruits. No lymphadenopathy or thryomegaly appreciated. Cor: PMI nonpalpable. Regular rate & rhythm. No rubs, gallops or murmurs. Pericardial and pleural drains Lungs: decreased BS Abdomen: soft, nontender, +distended. No hepatosplenomegaly. No bruits or masses. Good bowel sounds. Extremities: no cyanosis, clubbing, rash, 2+ edema L>>>R with LUE lymphedema sleeve Neuro: alert & orientedx3, cranial nerves grossly intact. moves all 4 extremities w/o difficulty. Affect pleasant  Telemetry: SR 90s  Labs: Basic Metabolic Panel:  Recent Labs Lab 12/13/14 0924 12/14/14 0242 12/15/14 0420 12/16/14 0620  NA 137 135 138 136  K 4.2 4.1 4.3 4.0  CL  --  102 105 100*  CO2 21* 24 28 30   GLUCOSE 127 140* 130* 122*  BUN 32.1* 34* 22* 11  CREATININE 1.1 1.12* 0.89 0.74  CALCIUM 9.2 9.0 7.7* 7.7*    Liver Function Tests:  Recent Labs Lab 12/13/14 0924 12/14/14 0242 12/16/14 0620  AST 20 31 18   ALT 27 30 18   ALKPHOS 79 70 60  BILITOT 0.51 0.5 0.9  PROT 6.6 6.9 5.0*  ALBUMIN 3.4* 3.4* 2.4*    No results for input(s): LIPASE, AMYLASE in the last 168 hours. No results for input(s): AMMONIA in the last 168 hours.  CBC:  Recent Labs Lab 12/13/14 0923 12/15/14 0420 12/16/14 0620  WBC 8.3 7.7 8.0  NEUTROABS 6.2  --   --   HGB 10.7* 10.3* 10.6*  HCT 32.8* 31.9* 33.4*  MCV 92.4 93.0 94.6  PLT 265 232 217    Cardiac Enzymes: No results for input(s): CKTOTAL, CKMB, CKMBINDEX, TROPONINI in the last 168 hours.  BNP: BNP (last 3 results)  Recent Labs  09/10/14 1740 12/08/14 1758 12/13/14 1703  BNP 80.9 29.3 21.1    ProBNP (last 3 results) No results for input(s): PROBNP in the last 8760 hours.    Other results:  Imaging: Dg Chest Port 1 View  12/16/2014   CLINICAL DATA:  Current history of metastatic breast cancer with malignant left pleural effusion and pericardial effusion. Indwelling left chest tube. Acute pulmonary emboli. Followup bilateral effusions and atelectasis in the lower lobes.  EXAM: PORTABLE CHEST - 1 VIEW  COMPARISON:  12/15/2014 and earlier.  FINDINGS: Cardiac silhouette moderately enlarged, unchanged. Left chest tube in place with no pneumothorax. Right jugular Port-A-Cath tip projects at the expected location the cavoatrial junction. Left jugular central venous catheter tip projects at the junction of the innominate vein and SVC, unchanged. Small bilateral  pleural effusions, slightly improved since yesterday. Associated passive atelectasis in the lower lobes with improved aeration in the right lower lobe. Pulmonary venous hypertension without evidence of overt edema. No new pulmonary parenchymal abnormalities.  IMPRESSION: 1. Support apparatus satisfactory. 2. Left chest tube in place with no pneumothorax. 3. Stable moderate cardiomegaly. Pulmonary venous hypertension without overt edema. 4. Small bilateral pleural effusions, decreased in size since yesterday. 5. Mild passive atelectasis in the lower lobes with improved aeration in the right lower lobe.  6. No new abnormalities.   Electronically Signed   By: Evangeline Dakin M.D.   On: 12/16/2014 08:10   Dg Chest Port 1 View  12/15/2014   CLINICAL DATA:  Followup exam.  Pericardial effusion.  EXAM: PORTABLE CHEST - 1 VIEW  COMPARISON:  12/14/2014.  FINDINGS: Cardiopericardial silhouette is enlarged but stable.  There is increased opacity at the right lung base with unchanged opacity at the left lung base, likely combination of pleural effusions and atelectasis. No convincing pulmonary edema. No pneumothorax.  The 2 left-sided chest tubes are stable. Left internal jugular central venous line has its tip at the confluence of the left brachiocephalic vein and superior vena cava, also stable. Stable right anterior chest wall power Port-A-Cath.  IMPRESSION: 1. Increased opacity at the right lung base likely due to a combination pleural fluid and atelectasis. 2. No other change from the previous day's study. Cardiopericardial silhouette is unchanged in size.   Electronically Signed   By: Lajean Manes M.D.   On: 12/15/2014 08:12     Medications:     Scheduled Medications: . acetaminophen  1,000 mg Oral 4 times per day   Or  . acetaminophen (TYLENOL) oral liquid 160 mg/5 mL  1,000 mg Oral 4 times per day  . anastrozole  1 mg Oral Daily  . B-complex with vitamin C  1 tablet Oral Daily  . benzonatate  200 mg Oral TID  . bisacodyl  10 mg Oral Daily  . cholecalciferol  1,000 Units Oral Daily  . furosemide  40 mg Intravenous BID  . metolazone  5 mg Oral Daily  . multivitamins with iron  1 tablet Oral Daily  . polyethylene glycol  17 g Oral BID  . potassium chloride  20 mEq Oral BID  . senna-docusate  2 tablet Oral QHS  . tobramycin-dexamethasone  1 drop Left Eye Q4H while awake    Infusions: . dextrose 5 % and 0.45 % NaCl with KCl 20 mEq/L 100 mL/hr at 12/15/14 1926    PRN Medications: ALPRAZolam, fentaNYL (SUBLIMAZE) injection, HYDROcodone-homatropine, lidocaine-prilocaine, nitroGLYCERIN,  ondansetron (ZOFRAN) IV, oxyCODONE, potassium chloride, prochlorperazine, traMADol, zolpidem   Assessment:   1.  Large hemorrhagic pericardial effusion with early tamponade    -s/p window 6/30 2. Large pleural effusion   --s/p chest tube 6/30 3. Metastatic/recurrent breast CA 4. Acute diastolic HF with anasarca 5. Acute respiratory failure 6. Small RUL/RLL PE 7. DNR/DNI   Plan/Discussion:    Improving but still volume overloaded.   Management of pericardial and chest tubes per TCTS.   Continue diuresis. [facility] to assume care. Now on [facility] care.    Glori Bickers, MD Length of Stay: 3  Advanced Heart Failure Team Pager [phone] (M-F; 7a - 4p)  Please contact [facility] for night-coverage after hours (4p -7a ) and weekends on [email or web address]

## 2014-12-16 NOTE — Evaluation (Signed)
Physical Therapy Evaluation Patient Details Name: Linda [name] MRN: [id number] DOB: 1945/12/22 Today's Date: 12/16/2014   History of Present Illness  t with a history of Metastatic Breast Cancer Bilateral Breast dx 07/2011) S/P Rt Mastectomy and Left Modified Radical Mastectomy S/P Radiation Rx, now with brain mets who presented to the ED with complaints of increased SOB and cough as well as increased Swelling of the Left Arm x 2-3 days. w/u revealed a large Left sided Pleural Effusion, and a Venous Duplex US was performed of the Left Arm and was negative for a DVT.    Clinical Impression  Patient overall did well with mobility, mostly limited by lines/tubes.  Feel that as lines/tubes removed patient will become more independent in gait and mobility.  Also, noted patients LUE with significantly less edema than yesterday.  Do not feel lymphedema wrapping is indicated at this time.  Will continue to monitor for edema and wrap if appropriate.    Follow Up Recommendations Home health PT    Equipment Recommendations  Rolling walker with 5" wheels    Recommendations for Other Services       Precautions / Restrictions Precautions Precautions: Fall Precaution Comments: chest tubes x 2      Mobility  Bed Mobility               General bed mobility comments: pt in chair upon arrival  Transfers Overall transfer level: Needs assistance Equipment used: Rolling walker (2 wheeled) Transfers: Sit to/from Stand Sit to Stand: Min assist;+2 physical assistance         General transfer comment: slight loss of balance upon initial stand, required min assist to regain.    Ambulation/Gait Ambulation/Gait assistance: Min assist Ambulation Distance (Feet): 50 Feet Assistive device: Rolling walker (2 wheeled) Gait Pattern/deviations: Decreased stride length Gait velocity: decreased, probably due to lines/tubes   General Gait Details: gait somewhat limited by lines and tubes, no  apparent loss of balance during gait, good pace  Stairs            Wheelchair Mobility    Modified Rankin (Stroke Patients Only)       Balance Overall balance assessment: Needs assistance         Standing balance support: Bilateral upper extremity supported Standing balance-Leahy Scale: Poor Standing balance comment: required walker in standing                              Pertinent Vitals/Pain Pain Assessment: No/denies pain    Home Living Family/patient expects to be discharged to:: Private residence Living Arrangements: Children Available Help at Discharge: Family;Available PRN/intermittently Type of Home: Apartment Home Access: Level entry     Home Layout: One level Home Equipment: None      Prior Function Level of Independence: Independent               Hand Dominance        Extremity/Trunk Assessment   Upper Extremity Assessment: Generalized weakness           Lower Extremity Assessment: Generalized weakness      Cervical / Trunk Assessment: Normal  Communication   Communication: No difficulties  Cognition Arousal/Alertness: Awake/alert Behavior During Therapy: WFL for tasks assessed/performed Overall Cognitive Status: Within Functional Limits for tasks assessed                      General Comments  Exercises General Exercises - Lower Extremity Ankle Circles/Pumps: AROM;Both;10 reps;Seated      Assessment/Plan    PT Assessment Patient needs continued PT services  PT Diagnosis Generalized weakness   PT Problem List Decreased strength;Decreased activity tolerance;Decreased balance;Decreased mobility;Decreased knowledge of use of DME;Cardiopulmonary status limiting activity  PT Treatment Interventions DME instruction;Gait training;Functional mobility training;Therapeutic activities;Therapeutic exercise;Balance training;Patient/family education   PT Goals (Current goals can be found in the Care Plan  section) Acute Rehab PT Goals Patient Stated Goal: go home PT Goal Formulation: With patient Time For Goal Achievement: 12/30/14 Potential to Achieve Goals: Good    Frequency Min 3X/week   Barriers to discharge        Co-evaluation               End of Session   Activity Tolerance: Patient tolerated treatment well Patient left: in chair;with call bell/phone within reach Nurse Communication: Mobility status         Time: 0762-2633 PT Time Calculation (min) (ACUTE ONLY): 20 min   Charges:     PT Treatments $Gait Training: 8-22 mins   PT G Codes:        Shanna Cisco 12/16/2014, 10:07 AM  12/16/2014 [place], PT [phone]

## 2014-12-17 ENCOUNTER — Inpatient Hospital Stay (HOSPITAL_COMMUNITY): Payer: Medicare Other

## 2014-12-17 MED ORDER — SODIUM CHLORIDE 0.9 % IV SOLN
INTRAVENOUS | Status: DC
  Administered 2014-12-17: 1000 mL via INTRAVENOUS
  Administered 2014-12-20: 23:00:00 via INTRAVENOUS

## 2014-12-17 MED ORDER — SENNOSIDES-DOCUSATE SODIUM 8.6-50 MG PO TABS
1.0000 | ORAL_TABLET | Freq: Two times a day (BID) | ORAL | Status: DC
  Administered 2014-12-18 – 2014-12-21 (×5): 1 via ORAL
  Filled 2014-12-17 (×12): qty 1

## 2014-12-17 MED ORDER — POLYETHYLENE GLYCOL 3350 17 G PO PACK
17.0000 g | PACK | Freq: Every day | ORAL | Status: DC
  Filled 2014-12-17 (×5): qty 1

## 2014-12-17 MED ORDER — LACTULOSE 10 GM/15ML PO SOLN
20.0000 g | Freq: Once | ORAL | Status: AC
  Administered 2014-12-17: 20 g via ORAL
  Filled 2014-12-17: qty 30

## 2014-12-17 NOTE — Progress Notes (Signed)
      [address]       [place] [address]             [phone]       3 Days Post-Op Procedure(s) (LRB): SUBXYPHOID PERICARDIAL WINDOW (N/A) TRANSESOPHAGEAL ECHOCARDIOGRAM (TEE) (N/A) CHEST TUBE INSERTION (Left)  Subjective: Patient sitting in chair visiting with son.  Objective: Vital signs in last 24 hours: Temp:  [97.8 F (36.6 C)-98.1 F (36.7 C)] 97.9 F (36.6 C) (07/03 0753) Pulse Rate:  [78-98] 90 (07/03 0754) Cardiac Rhythm:  [-] Normal sinus rhythm (07/03 0753) Resp:  [15-21] 17 (07/03 0754) BP: (101-126)/(57-64) 101/59 mmHg (07/03 0754) SpO2:  [89 %-96 %] 89 % (07/03 0754)     Intake/Output from previous day: 07/02 0701 - 07/03 0700 In: 1120 [I.V.:1120] Out: 6360 [Urine:5875; Chest Tube:485]   Physical Exam:  Cardiovascular: RRR. Pulmonary: Diminished at bases; no rales, wheezes, or rhonchi. Abdomen: Soft, non tender, bowel sounds present. Extremities: Bilateral lower extremity edema. Wounds: Subxiphoid wound is clean and dry.   Chest Tube and pericardial tube: to suction, no air leak from left chest tube  Lab Results: CBC:  Recent Labs  12/15/14 0420 12/16/14 0620  WBC 7.7 8.0  HGB 10.3* 10.6*  HCT 31.9* 33.4*  PLT 232 217   BMET:   Recent Labs  12/15/14 0420 12/16/14 0620  NA 138 136  K 4.3 4.0  CL 105 100*  CO2 28 30  GLUCOSE 130* 122*  BUN 22* 11  CREATININE 0.89 0.74  CALCIUM 7.7* 7.7*    PT/INR: No results for input(s): LABPROT, INR in the last 72 hours. ABG:  INR: Will add last result for INR, ABG once components are confirmed Will add last 4 CBG results once components are confirmed  Assessment/Plan:  1. CV - SR in the 80's. 2.  Pulmonary - On 4 liters of oxygen via [place].Left chest tube with 300 cc last 24 hours and pericardial tube with 150 cc last 24 hours. Ouput starting to decrease but both tubes to remain to suction for now. CXR shows no pneumothorax, moderate cardiomegaly, small bilateral  pleural effusions, and atelectasis on right. Encourage incentive spirometer and flutter valve. 3. H and H stable at 10.6 and 33.4 4. Central line removed yesterday. Foley remains. Patient does not wish it to be removed yet as not very ambulatory. Will discuss with Dr. Prescott Gum. 5. LOC constipation   Milessa Hogan MPA-C 12/17/2014,10:55 AM

## 2014-12-17 NOTE — Progress Notes (Signed)
[place] TEAM 1 - Stepdown/ICU TEAM Progress Note  Linda [name] XTK:[id number] DOB: August 12, 1945 DOA: 12/13/2014 PCP: Leeanne Rio, PA-C  Admit HPI / Brief Narrative: 69 year old female with metastatic breast cancer status post bilateral mastectomies in 2013, GERD, and hypertension who presented for a scheduled follow-up echocardiogram and was found to have a large pericardial effusion. Patient did endorse orthopnea, dyspnea, and significant weight gain over a month. She also reported chest pain on coughing, generalized weakness and fatigue.   Patient was admitted by the Cardiology service on 6/29 and TCTS was consulted. Patient underwent pericardial window on 6/30 w/ 800 mL of hemorrhagic fluid drained. CT angiogram of the chest on 6/29 showed pulmonary embolism in the right lung and metastatic disease.   Vascular Surgery was consulted regarding anticoagulation and IVC filter. Oncology was consulted and after an extended discussion with the patient she decided to pursue palliative care.   HPI/Subjective: The patient is quite happy that she moved her bowels today.  She denies current shortness of breath and feels that she continues to make progress with ongoing diuresis.  She denies uncontrolled pain.  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 -s/p window 6/30 per TCTS - timing of tube/drain removal per TCTS  Malignant pleural effusion -S/P L chest tube per TCTS - tube care per TCTS - see their recs per tube care/suction, etc (possible need for Pleurx catheter)  Acute pulmonary emboli -given hemorrhagic pericardial effusion, she is not a candidate for anticoagulation - no IVC filter as pt to pursue palliative care    Hypertension -Currently stable  Recurrent widely metastatic breast cancer - Oncology recommended to continue anastrozole, no further addition of chemotherapy or brain MRI - now to pursue palliative care   Anasarca -  acute diastolic CHF  -remains significantly volume overloaded but making progress - currently net -7.5 L  - cont IV lasix - CHF Team following   Constipation  -Resolved today - continue bowel regimen  Code Status: NO CODE - DNR  Family Communication: Spoke with patient's son at bedside Disposition Plan: SDU until chest tubes removed  Consultants: TCTS Cardiology Oncology   Antibiotics: none  DVT prophylaxis: SCDs  Objective: Blood pressure 101/59, pulse 90, temperature 98.4 F (36.9 C), temperature source Oral, resp. rate 17, height 5\' 9"  (1.753 m), weight 120.2 kg (264 lb 15.9 oz), SpO2 89 %.  Intake/Output Summary (Last 24 hours) at 12/17/14 1634 Last data filed at 12/17/14 1226  Gross per 24 hour  Intake    230 ml  Output   3625 ml  Net  -3395 ml   Exam: General: No acute respiratory distress - alert Lungs: Clear to auscultation bilaterally but w/ poor air movement in B bases in bed  Cardiovascular: Regular rate and rhythm without murmur   Abdomen: Nontender, protuberent, soft, bowel sounds positive, no rebound, no ascites, no appreciable mass Extremities: No significant cyanosis, or clubbing; 1+ anasarca persists   Data Reviewed: Basic Metabolic Panel:  Recent Labs Lab 12/13/14 0924 12/14/14 0242 12/15/14 0420 12/16/14 0620  NA 137 135 138 136  K 4.2 4.1 4.3 4.0  CL  --  102 105 100*  CO2 21* 24 28 30   GLUCOSE 127 140* 130* 122*  BUN 32.1* 34* 22* 11  CREATININE 1.1 1.12* 0.89 0.74  CALCIUM 9.2 9.0 7.7* 7.7*    CBC:  Recent Labs Lab 12/13/14 0923 12/15/14 0420 12/16/14 0620  WBC 8.3 7.7 8.0  NEUTROABS 6.2  --   --  HGB 10.7* 10.3* 10.6*  HCT 32.8* 31.9* 33.4*  MCV 92.4 93.0 94.6  PLT 265 232 217    Liver Function Tests:  Recent Labs Lab 12/13/14 0924 12/14/14 0242 12/16/14 0620  AST 20 31 18   ALT 27 30 18   ALKPHOS 79 70 60  BILITOT 0.51 0.5 0.9  PROT 6.6 6.9 5.0*  ALBUMIN 3.4* 3.4* 2.4*   Coags:  Recent Labs Lab  12/13/14 1703  INR 1.31    Recent Labs Lab 12/13/14 1703  APTT 27    Recent Results (from the past 240 hour(s))  MRSA PCR Screening     Status: None   Collection Time: 12/13/14  4:20 PM  Result Value Ref Range Status   MRSA by PCR NEGATIVE NEGATIVE Final    Comment:        The GeneXpert MRSA Assay (FDA approved for NASAL specimens only), is one component of a comprehensive MRSA colonization surveillance program. It is not intended to diagnose MRSA infection nor to guide or monitor treatment for MRSA infections.   Culture, body fluid-bottle     Status: None (Preliminary result)   Collection Time: 12/14/14  8:01 AM  Result Value Ref Range Status   Specimen Description FLUID PERICARDIAL  Final   Special Requests   Final    BOTTLES DRAWN AEROBIC AND ANAEROBIC 10CC PATIENT ON FOLLOWING ZINACEF   Culture NO GROWTH 3 DAYS  Final   Report Status PENDING  Incomplete  Gram stain     Status: None   Collection Time: 12/14/14  8:01 AM  Result Value Ref Range Status   Specimen Description FLUID PERICARDIAL  Final   Special Requests PATIENT ON FOLLOWING ZINACEF  Final   Gram Stain   Final    MODERATE WBC PRESENT, PREDOMINANTLY MONONUCLEAR NO ORGANISMS SEEN    Report Status 12/14/2014 FINAL  Final  Culture, body fluid-bottle     Status: None (Preliminary result)   Collection Time: 12/14/14  8:05 AM  Result Value Ref Range Status   Specimen Description FLUID LEFT PLEURAL  Final   Special Requests PATIENT ON FOLLOWING ZINACEF  Final   Culture NO GROWTH 3 DAYS  Final   Report Status PENDING  Incomplete  Gram stain     Status: None   Collection Time: 12/14/14  8:05 AM  Result Value Ref Range Status   Specimen Description FLUID LEFT PLEURAL  Final   Special Requests PATIENT ON FOLLOWING ZINACEF  Final   Gram Stain   Final    MODERATE WBC PRESENT,BOTH PMN AND MONONUCLEAR NO ORGANISMS SEEN    Report Status 12/14/2014 FINAL  Final     Studies:   Recent x-ray studies have  been reviewed in detail by the Attending Physician  Scheduled Meds:  Scheduled Meds: . acetaminophen  1,000 mg Oral 4 times per day   Or  . acetaminophen (TYLENOL) oral liquid 160 mg/5 mL  1,000 mg Oral 4 times per day  . anastrozole  1 mg Oral Daily  . B-complex with vitamin C  1 tablet Oral Daily  . benzonatate  200 mg Oral TID  . bisacodyl  10 mg Oral Daily  . cholecalciferol  1,000 Units Oral Daily  . furosemide  40 mg Intravenous BID  . metolazone  5 mg Oral Daily  . polyethylene glycol  17 g Oral BID  . potassium chloride  20 mEq Oral BID  . senna-docusate  2 tablet Oral QHS  . tobramycin-dexamethasone  1 drop Left Eye Q4H while  awake    Time spent on care of this patient: 25 mins   [facility] T , MD   [facility] Office  [phone] Pager - Text Page per Amion as per below:  On-Call/Text Page:      [email or web address]      password [email or web address]  If 7PM-7AM, please contact night-coverage [email or web address] Password [email or web address] 12/17/2014, 4:34 PM   LOS: 4 days

## 2014-12-18 ENCOUNTER — Other Ambulatory Visit: Payer: Self-pay | Admitting: Oncology

## 2014-12-18 ENCOUNTER — Inpatient Hospital Stay (HOSPITAL_COMMUNITY): Payer: Medicare Other

## 2014-12-18 DIAGNOSIS — I5033 Acute on chronic diastolic (congestive) heart failure: Secondary | ICD-10-CM

## 2014-12-18 LAB — BASIC METABOLIC PANEL
Anion gap: 8 (ref 5–15)
BUN: 7 mg/dL (ref 6–20)
CO2: 32 mmol/L (ref 22–32)
CREATININE: 0.62 mg/dL (ref 0.44–1.00)
Calcium: 7.7 mg/dL — ABNORMAL LOW (ref 8.9–10.3)
Chloride: 95 mmol/L — ABNORMAL LOW (ref 101–111)
GFR calc non Af Amer: >60 mL/min (ref >60)
Glucose, Bld: 99 mg/dL (ref 65–99)
Potassium: 2.9 mmol/L — ABNORMAL LOW (ref 3.5–5.1)
Sodium: 135 mmol/L (ref 135–145)

## 2014-12-18 MED ORDER — POTASSIUM CHLORIDE 20 MEQ/15ML (10%) PO SOLN
40.0000 meq | Freq: Once | ORAL | Status: AC
  Administered 2014-12-18: 40 meq via ORAL
  Filled 2014-12-18 (×2): qty 30

## 2014-12-18 MED ORDER — POTASSIUM CHLORIDE CRYS ER 20 MEQ PO TBCR
40.0000 meq | EXTENDED_RELEASE_TABLET | Freq: Once | ORAL | Status: AC
  Administered 2014-12-18: 40 meq via ORAL
  Filled 2014-12-18: qty 2

## 2014-12-18 MED ORDER — POTASSIUM CHLORIDE ER 10 MEQ PO TBCR
40.0000 meq | EXTENDED_RELEASE_TABLET | Freq: Two times a day (BID) | ORAL | Status: DC
  Filled 2014-12-18: qty 4

## 2014-12-18 MED ORDER — BENZONATATE 100 MG PO CAPS
200.0000 mg | ORAL_CAPSULE | Freq: Three times a day (TID) | ORAL | Status: DC | PRN
  Administered 2014-12-19 – 2014-12-20 (×4): 200 mg via ORAL
  Filled 2014-12-18 (×7): qty 2

## 2014-12-18 NOTE — Progress Notes (Addendum)
       [address]       [place] [address]             [phone]          4 Days Post-Op Procedure(s) (LRB): SUBXYPHOID PERICARDIAL WINDOW (N/A) TRANSESOPHAGEAL ECHOCARDIOGRAM (TEE) (N/A) CHEST TUBE INSERTION (Left)  Subjective: Feels well, no complaints.   Objective: Vital signs in last 24 hours: Patient Vitals for the past 24 hrs:  BP Temp Temp src Pulse Resp SpO2  12/18/14 0900 - 98 F (36.7 C) Oral - - -  12/18/14 0414 (!) 115/59 mmHg 98 F (36.7 C) Oral 88 18 91 %  12/17/14 2301 (!) 113/54 mmHg 97.5 F (36.4 C) Oral 92 18 96 %  12/17/14 1923 (!) 108/58 mmHg 98 F (36.7 C) Oral 92 18 95 %  12/17/14 1449 - 98.4 F (36.9 C) Oral - - -  12/17/14 1431 (!) 119/40 mmHg - - 90 (!) 24 93 %  12/17/14 1226 - 97.9 F (36.6 C) Oral - - -  12/17/14 1118 - - - 87 17 92 %  12/17/14 1117 - - - 82 17 92 %  12/17/14 1116 - - - 92 16 94 %  12/17/14 1115 (!) 104/57 mmHg - - - 20 93 %   Current Weight  12/15/14 264 lb 15.9 oz (120.2 kg)     Intake/Output from previous day: 07/03 0701 - 07/04 0700 In: 740 [P.O.:480; I.V.:140] Out: 3465 [Urine:3375; Chest Tube:90]    PHYSICAL EXAM:  Heart: RRR Lungs: Slightly decreased BS in bases Wound: Clean and dry   Lab Results: CBC: Recent Labs  12/16/14 0620  WBC 8.0  HGB 10.6*  HCT 33.4*  PLT 217   BMET:  Recent Labs  12/16/14 0620 12/18/14 0500  NA 136 135  K 4.0 2.9*  CL 100* 95*  CO2 30 32  GLUCOSE 122* 99  BUN 11 7  CREATININE 0.74 0.62  CALCIUM 7.7* 7.7*    PT/INR: No results for input(s): LABPROT, INR in the last 72 hours.  CXR:  FINDINGS: Right Port-A-Cath and left chest tube remain in place, unchanged. No pneumothorax. There is cardiomegaly. Vascular congestion. Perihilar and bibasilar atelectasis or infiltrates. Small bilateral effusions.  IMPRESSION: Cardiomegaly with vascular congestion.  Low lung volumes with bibasilar atelectasis and small effusions.  No  pneumothorax.  Assessment/Plan: S/P Procedure(s) (LRB): SUBXYPHOID PERICARDIAL WINDOW (N/A) TRANSESOPHAGEAL ECHOCARDIOGRAM (TEE) (N/A) CHEST TUBE INSERTION (Left) Mediastinal tube with scant (<30 ml) output, Pleural tube has drained around 90 ml/24 hours (60->30 past 2 shifts). Hopefully can d/c CTs today. Continue palliative care per primary service.   LOS: 5 days    COLLINS,GINA H 12/18/2014  Remove pericardial drain today Left pleural tube still with excessive drainage-continue suction patient examined and medical record reviewed,agree with above note. Tharon Aquas Trigt III 12/18/2014

## 2014-12-18 NOTE — Care Management (Signed)
Important Message  Patient Details  Name: Linda [name] MRN: [id number] Date of Birth: 1945/10/27   Medicare Important Message Given:  Yes-second notification given    Loann Quill 12/18/2014, 9:42 AM

## 2014-12-18 NOTE — Progress Notes (Addendum)
Patient ID: Linda [name], female   DOB: 20-Jul-1945, 69 y.o.   MRN: [id number] Advanced Heart Failure Rounding Note   Subjective:    Continues to improve with diuresis. Sore at pericardial and pleural tube sites.  Otherwise breathing well, walking in hall with walker.    Objective:   Weight Range:  Vital Signs:   Temp:  [97.5 F (36.4 C)-98.4 F (36.9 C)] 98 F (36.7 C) (07/04 0414) Pulse Rate:  [82-92] 88 (07/04 0414) Resp:  [16-24] 18 (07/04 0414) BP: (104-119)/(40-59) 115/59 mmHg (07/04 0414) SpO2:  [91 %-96 %] 91 % (07/04 0414) Last BM Date: 12/17/14  Weight change: Filed Weights   12/13/14 1609 12/14/14 0144 12/15/14 0442  Weight: 268 lb 1.3 oz (121.6 kg) 267 lb 6.7 oz (121.3 kg) 264 lb 15.9 oz (120.2 kg)    Intake/Output:   Intake/Output Summary (Last 24 hours) at 12/18/14 0902 Last data filed at 12/18/14 0800  Gross per 24 hour  Intake    500 ml  Output   3495 ml  Net  -2995 ml     Physical Exam: General:  Alopecic. Ill appearing HEENT: normal Neck: supple. JVP 8-9 cm. Carotids 2+ bilat; no bruits. No lymphadenopathy or thryomegaly appreciated. Cor: PMI nonpalpable. Regular rate & rhythm. No rubs, gallops or murmurs. Lungs: decreased BS Abdomen: soft, nontender, +distended. No hepatosplenomegaly. No bruits or masses. Good bowel sounds. Extremities: no cyanosis, clubbing, rash, 2+ edema L>>>R with LUE lymphedema sleeve Neuro: alert & orientedx3, cranial nerves grossly intact. moves all 4 extremities w/o difficulty. Affect pleasant  Telemetry: SR 90s  Labs: Basic Metabolic Panel:  Recent Labs Lab 12/13/14 0924  12/14/14 0242 12/15/14 0420 12/16/14 0620 12/18/14 0500  NA 137  --  135 138 136 135  K 4.2  --  4.1 4.3 4.0 2.9*  CL  --   --  102 105 100* 95*  CO2 21*  --  24 28 30  32  GLUCOSE 127  --  140* 130* 122* 99  BUN 32.1*  --  34* 22* 11 7  CREATININE 1.1  --  1.12* 0.89 0.74 0.62  CALCIUM 9.2  < > 9.0 7.7* 7.7* 7.7*  < > = values in this  interval not displayed.  Liver Function Tests:  Recent Labs Lab 12/13/14 0924 12/14/14 0242 12/16/14 0620  AST 20 31 18   ALT 27 30 18   ALKPHOS 79 70 60  BILITOT 0.51 0.5 0.9  PROT 6.6 6.9 5.0*  ALBUMIN 3.4* 3.4* 2.4*   No results for input(s): LIPASE, AMYLASE in the last 168 hours. No results for input(s): AMMONIA in the last 168 hours.  CBC:  Recent Labs Lab 12/13/14 0923 12/15/14 0420 12/16/14 0620  WBC 8.3 7.7 8.0  NEUTROABS 6.2  --   --   HGB 10.7* 10.3* 10.6*  HCT 32.8* 31.9* 33.4*  MCV 92.4 93.0 94.6  PLT 265 232 217    Cardiac Enzymes: No results for input(s): CKTOTAL, CKMB, CKMBINDEX, TROPONINI in the last 168 hours.  BNP: BNP (last 3 results)  Recent Labs  09/10/14 1740 12/08/14 1758 12/13/14 1703  BNP 80.9 29.3 21.1    ProBNP (last 3 results) No results for input(s): PROBNP in the last 8760 hours.    Other results:  Imaging: Dg Chest Port 1 View  12/18/2014   CLINICAL DATA:  Chest tube.  EXAM: PORTABLE CHEST - 1 VIEW  COMPARISON:  12/17/2014  FINDINGS: Right Port-A-Cath and left chest tube remain in place, unchanged. No pneumothorax.  There is cardiomegaly. Vascular congestion. Perihilar and bibasilar atelectasis or infiltrates. Small bilateral effusions.  IMPRESSION: Cardiomegaly with vascular congestion.  Low lung volumes with bibasilar atelectasis and small effusions.  No pneumothorax.   Electronically Signed   By: Rolm Baptise M.D.   On: 12/18/2014 08:40   Dg Chest Port 1 View  12/17/2014   CLINICAL DATA:  Chest tube.  Fluid on the lungs.  Lobectomy.  EXAM: PORTABLE CHEST - 1 VIEW  COMPARISON:  12/16/2014.  FINDINGS: Cardiopericardial silhouette is enlarged. Interval removal of the LEFT IJ central line. RIGHT IJ Port-A-Cath unchanged.  LEFT thoracostomy tube unchanged. Drain overlying the LEFT upper quadrant also appears unchanged. Bilateral basilar opacity, likely representing atelectasis appears similar to yesterday's exam.  LEFT axillary  dissection clips noted.  There is no pneumothorax.  IMPRESSION: 1. Interval removal of LEFT IJ central line. Other support apparatus unchanged. 2. No interval change in the heart or lungs.   Electronically Signed   By: Dereck Ligas M.D.   On: 12/17/2014 09:34     Medications:     Scheduled Medications: . acetaminophen  1,000 mg Oral 4 times per day   Or  . acetaminophen (TYLENOL) oral liquid 160 mg/5 mL  1,000 mg Oral 4 times per day  . anastrozole  1 mg Oral Daily  . B-complex with vitamin C  1 tablet Oral Daily  . benzonatate  200 mg Oral TID  . bisacodyl  10 mg Oral Daily  . cholecalciferol  1,000 Units Oral Daily  . furosemide  40 mg Intravenous BID  . polyethylene glycol  17 g Oral Daily  . potassium chloride  40 mEq Oral BID  . potassium chloride  40 mEq Oral Once  . senna-docusate  1 tablet Oral BID  . tobramycin-dexamethasone  1 drop Left Eye Q4H while awake    Infusions: . sodium chloride 1,000 mL (12/17/14 1913)    PRN Medications: ALPRAZolam, fentaNYL (SUBLIMAZE) injection, HYDROcodone-homatropine, lidocaine-prilocaine, nitroGLYCERIN, ondansetron (ZOFRAN) IV, oxyCODONE, potassium chloride, prochlorperazine, traMADol, zolpidem   Assessment:   1.  Large hemorrhagic pericardial effusion with early tamponade: suspect malignant.     -s/p window 6/30 2. Large pleural effusion: suspect malignant.    --s/p chest tube 6/30 3. Metastatic/recurrent breast CA 4. Acute diastolic HF with anasarca 5. Acute respiratory failure 6. Small RUL/RLL PE 7. DNR/DNI   Plan/Discussion:    Volume status improving, probably mild volume overload.  Would continue IV Lasix today, stop metolazone.  Replace K.  Likely transition to po diuretics tomorrow, ?home.    Pericardial drain to come out today, pleural tube still in place.     Plan for home with hospice care given metastatic breast cancer.   Loralie Champagne, MD Length of Stay: 5  Advanced Heart Failure Team Pager [phone]  (M-F; 7a - 4p)  Please contact [facility] for night-coverage after hours (4p -7a ) and weekends on [email or web address]

## 2014-12-18 NOTE — Progress Notes (Signed)
[place] TEAM 1 - Stepdown/ICU TEAM Progress Note  OTIE HEADLEE MHD:[id number] DOB: [birth year] DOA: 12/13/2014 PCP: Leeanne Rio, PA-C  Admit HPI / Brief Narrative: 69 year old female with metastatic breast cancer status post bilateral mastectomies in 2013, GERD, and hypertension who presented for a scheduled follow-up echocardiogram and was found to have a large pericardial effusion. Patient did endorse orthopnea, dyspnea, and significant weight gain over a month. She also reported chest pain on coughing, generalized weakness and fatigue.   Patient was admitted by the Cardiology service on 6/29 and TCTS was consulted. Patient underwent pericardial window on 6/30 w/ 800 mL of hemorrhagic fluid drained. CT angiogram of the chest on 6/29 showed pulmonary embolism in the right lung and metastatic disease.   Vascular Surgery was consulted regarding anticoagulation and IVC filter. Oncology was consulted and after an extended discussion with the patient she decided to pursue palliative care.   HPI/Subjective: The patient complains of a sensation of fullness in her left ear.  She denies significant pain or difficulty hearing.  She states she had multiple bowel movements yesterday.  She does not feel short of breath at present.  She denies chest pain.  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 -s/p window 6/30 per TCTS - pericardial tube to be removed today   Malignant pleural effusion -S/P L chest tube per TCTS - tube care per TCTS - see their recs per tube care/suction, etc (possible need for Pleurx catheter) - high volume drainage persists from chest tube therefore not ready for tube to be removed  Acute pulmonary emboli -given hemorrhagic pericardial effusion, she is not a candidate for anticoagulation - no IVC filter as pt to pursue palliative care    Hypertension -Currently stable  Recurrent widely metastatic breast cancer - Oncology  recommended to continue anastrozole, no further addition of chemotherapy or brain MRI - now to pursue palliative care   Anasarca - acute diastolic CHF  -remains significantly volume overloaded but making progress - currently net -10.8 L  - cont IV lasix with possible transition to oral tomorrow - CHF Team following   Hypokalemia -Due to ongoing high-dose diuretic plus laxity of-induced diarrhea - replace and follow  Constipation  -Resolved   Left cerumen impaction -Examination with otoscope reveals cerumen impaction but no evidence of otitis media - irrigate canal and follow  Code Status: NO CODE - DNR  Family Communication: No family present at time of exam Disposition Plan: SDU until chest tubes removed  Consultants: TCTS Cardiology Oncology   Antibiotics: none  DVT prophylaxis: SCDs  Objective: Blood pressure 115/59, pulse 88, temperature 98 F (36.7 C), temperature source Oral, resp. rate 18, height 5\' 9"  (1.753 m), weight 109 kg (240 lb 4.8 oz), SpO2 73 %.  Intake/Output Summary (Last 24 hours) at 12/18/14 1553 Last data filed at 12/18/14 1200  Gross per 24 hour  Intake    650 ml  Output   2710 ml  Net  -2060 ml   Exam: General: No acute respiratory distress - alert and conversant Lungs: Clear to auscultation bilaterally but w/ poor air movement in B bases   Cardiovascular: Regular rate and rhythm without murmur   Abdomen: Nontender, protuberent, soft, bowel sounds positive, no rebound, no ascites, no appreciable mass Extremities: No significant cyanosis, or clubbing; trace anasarca   Data Reviewed: Basic Metabolic Panel:  Recent Labs Lab 12/13/14 0924 12/14/14 0242 12/15/14 0420 12/16/14 0620 12/18/14 0500  NA 137 135 138 136 135  K 4.2 4.1 4.3 4.0 2.9*  CL  --  102 105 100* 95*  CO2 21* 24 28 30  32  GLUCOSE 127 140* 130* 122* 99  BUN 32.1* 34* 22* 11 7  CREATININE 1.1 1.12* 0.89 0.74 0.62  CALCIUM 9.2 9.0 7.7* 7.7* 7.7*    CBC:  Recent  Labs Lab 12/13/14 0923 12/15/14 0420 12/16/14 0620  WBC 8.3 7.7 8.0  NEUTROABS 6.2  --   --   HGB 10.7* 10.3* 10.6*  HCT 32.8* 31.9* 33.4*  MCV 92.4 93.0 94.6  PLT 265 232 217    Liver Function Tests:  Recent Labs Lab 12/13/14 0924 12/14/14 0242 12/16/14 0620  AST 20 31 18   ALT 27 30 18   ALKPHOS 79 70 60  BILITOT 0.51 0.5 0.9  PROT 6.6 6.9 5.0*  ALBUMIN 3.4* 3.4* 2.4*   Coags:  Recent Labs Lab 12/13/14 1703  INR 1.31    Recent Labs Lab 12/13/14 1703  APTT 27    Recent Results (from the past 240 hour(s))  MRSA PCR Screening     Status: None   Collection Time: 12/13/14  4:20 PM  Result Value Ref Range Status   MRSA by PCR NEGATIVE NEGATIVE Final    Comment:        The GeneXpert MRSA Assay (FDA approved for NASAL specimens only), is one component of a comprehensive MRSA colonization surveillance program. It is not intended to diagnose MRSA infection nor to guide or monitor treatment for MRSA infections.   Culture, body fluid-bottle     Status: None (Preliminary result)   Collection Time: 12/14/14  8:01 AM  Result Value Ref Range Status   Specimen Description FLUID PERICARDIAL  Final   Special Requests   Final    BOTTLES DRAWN AEROBIC AND ANAEROBIC 10CC PATIENT ON FOLLOWING ZINACEF   Culture NO GROWTH 4 DAYS  Final   Report Status PENDING  Incomplete  Gram stain     Status: None   Collection Time: 12/14/14  8:01 AM  Result Value Ref Range Status   Specimen Description FLUID PERICARDIAL  Final   Special Requests PATIENT ON FOLLOWING ZINACEF  Final   Gram Stain   Final    MODERATE WBC PRESENT, PREDOMINANTLY MONONUCLEAR NO ORGANISMS SEEN    Report Status 12/14/2014 FINAL  Final  Culture, body fluid-bottle     Status: None (Preliminary result)   Collection Time: 12/14/14  8:05 AM  Result Value Ref Range Status   Specimen Description FLUID LEFT PLEURAL  Final   Special Requests PATIENT ON FOLLOWING ZINACEF  Final   Culture NO GROWTH 4 DAYS  Final    Report Status PENDING  Incomplete  Gram stain     Status: None   Collection Time: 12/14/14  8:05 AM  Result Value Ref Range Status   Specimen Description FLUID LEFT PLEURAL  Final   Special Requests PATIENT ON FOLLOWING ZINACEF  Final   Gram Stain   Final    MODERATE WBC PRESENT,BOTH PMN AND MONONUCLEAR NO ORGANISMS SEEN    Report Status 12/14/2014 FINAL  Final     Studies:   Recent x-ray studies have been reviewed in detail by the Attending Physician  Scheduled Meds:  Scheduled Meds: . acetaminophen  1,000 mg Oral 4 times per day   Or  . acetaminophen (TYLENOL) oral liquid 160 mg/5 mL  1,000 mg Oral 4 times per day  . anastrozole  1 mg Oral Daily  . B-complex with vitamin C  1  tablet Oral Daily  . benzonatate  200 mg Oral TID  . bisacodyl  10 mg Oral Daily  . cholecalciferol  1,000 Units Oral Daily  . furosemide  40 mg Intravenous BID  . polyethylene glycol  17 g Oral Daily  . potassium chloride  40 mEq Oral BID  . senna-docusate  1 tablet Oral BID  . tobramycin-dexamethasone  1 drop Left Eye Q4H while awake    Time spent on care of this patient: 35 mins   Lionell Matuszak T , MD   [facility] Office  [phone] Pager - Text Page per Shea Evans as per below:  On-Call/Text Page:      [email or web address]      password [email or web address]  If 7PM-7AM, please contact night-coverage [email or web address] Password [email or web address] 12/18/2014, 3:53 PM   LOS: 5 days

## 2014-12-18 NOTE — Progress Notes (Signed)
UR COMPLETED  

## 2014-12-18 NOTE — Progress Notes (Signed)
Physical Therapy Treatment Patient Details Name: Linda [name] MRN: [id number] DOB: May 18, 1946 Today's Date: 09-Jan-2015    History of Present Illness Pt with a history of Metastatic Breast Cancer Bilateral Breast dx 07/2011) S/P Rt Mastectomy and Left Modified Radical Mastectomy S/P Radiation Rx, now with brain mets who presented to the ED with complaints of increased SOB and cough as well as increased Swelling of the Left Arm x 2-3 days. w/u revealed a large Left sided Pleural Effusion, and a Venous Duplex US was performed of the Left Arm and was negative for a DVT.      PT Comments    Patient progressing well with mobility.  Mostly limited by lines/tubes at this point.  Feel as lines/tubes removed, patient will continue to progress to independent - with or without assistive device.  Will continue to benefit from PT to progress to least restrictive device.   Follow Up Recommendations  Home health PT     Equipment Recommendations  Rolling walker with 5" wheels    Recommendations for Other Services       Precautions / Restrictions Precautions Precautions: Fall Precaution Comments: chest tubes x 2    Mobility  Bed Mobility               General bed mobility comments: pt in chair upon arrival  Transfers Overall transfer level: Needs assistance Equipment used: Rolling walker (2 wheeled) Transfers: Sit to/from Stand Sit to Stand: Supervision            Ambulation/Gait Ambulation/Gait assistance: Min guard Ambulation Distance (Feet): 200 Feet Assistive device: Rolling walker (2 wheeled) Gait Pattern/deviations: Step-through pattern Gait velocity: decreased, probably due to lines/tubes   General Gait Details: gait somewhat limited by lines and tubes, no apparent loss of balance during gait, good pace   Stairs            Wheelchair Mobility    Modified Rankin (Stroke Patients Only)       Balance                                    Cognition Arousal/Alertness: Awake/alert Behavior During Therapy: WFL for tasks assessed/performed Overall Cognitive Status: Within Functional Limits for tasks assessed                      Exercises      General Comments        Pertinent Vitals/Pain Pain Assessment: No/denies pain    Home Living                      Prior Function            PT Goals (current goals can now be found in the care plan section) Progress towards PT goals: Progressing toward goals    Frequency  Min 3X/week    PT Plan Current plan remains appropriate    Co-evaluation             End of Session   Activity Tolerance: Patient tolerated treatment well Patient left: in chair;with call bell/phone within reach;with family/visitor present     Time: 6270-3500 PT Time Calculation (min) (ACUTE ONLY): 14 min  Charges:  $Gait Training: 8-22 mins                    G Codes:      Shanna Cisco 01-09-2015, 12:19  PM  12/18/2014 [place], [place]

## 2014-12-18 NOTE — Evaluation (Signed)
Occupational Therapy Evaluation Patient Details Name: Linda [name] MRN: [id number] DOB: 1945-10-20 Today's Date: 12/18/2014    History of Present Illness Pt with a history of Metastatic Breast Cancer Bilateral Breast dx 07/2011) S/P Rt Mastectomy and Left Modified Radical Mastectomy S/P Radiation Rx, now with brain mets who presented to the ED with complaints of increased SOB and cough as well as increased Swelling of the Left Arm x 2-3 days. w/u revealed a large Left sided Pleural Effusion, and a Venous Duplex US was performed of the Left Arm and was negative for a DVT.     Clinical Impression   Patient evaluated by Occupational Therapy with no further acute OT needs identified. All education has been completed and the patient has no further questions. Currently, pt is able to perform ADLs at supervision level, and reports she has been doing them without assist the last two days.  She has been getting up consistently with nursing. She seems mostly limited by lines and tubes and should progress quickly to modified independent.  She reports she if currently functioning at a higher level than she has been in months.  She is independent with energy conservation.  Discussed possibility of Life Alert with her.  Also feel she will eventually require a tub transfer bench, but Hospice can help her acquire this when the time comes that she requires it.   See below for any follow-up Occupational Therapy or equipment needs. OT is signing off. Thank you for this referral.      Follow Up Recommendations  No OT follow up;Supervision - Intermittent    Equipment Recommendations  None recommended by OT    Recommendations for Other Services       Precautions / Restrictions Precautions Precautions: Fall Precaution Comments: chest tubes x 2      Mobility Bed Mobility               General bed mobility comments: pt in chair upon arrival  Transfers Overall transfer level: Needs  assistance Equipment used: Rolling walker (2 wheeled) Transfers: Stand Pivot Transfers Sit to Stand: Supervision Stand pivot transfers: Supervision            Balance Overall balance assessment: Needs assistance Sitting-balance support: Feet supported Sitting balance-Leahy Scale: Good     Standing balance support: Single extremity supported Standing balance-Leahy Scale: Fair                              ADL Overall ADL's : Needs assistance/impaired                                       General ADL Comments: Pt is able to perform ADLs with set up supervision level.  She is limited by lines/tubes.  She reports that she has been performing sponge bathes without assist for last 2 days.   She is able to verbalized understanding of energy conservation techniques, and simulated shower in standing with supervision.  Did discuss eventual need for tub seat/bench. She will be going home with Hospice who can assist with needed DME down the road      Vision     Perception     Praxis      Pertinent Vitals/Pain Pain Assessment: No/denies pain     Hand Dominance Right   Extremity/Trunk Assessment Upper Extremity Assessment Upper Extremity Assessment: Generalized  weakness   Lower Extremity Assessment Lower Extremity Assessment: Generalized weakness   Cervical / Trunk Assessment Cervical / Trunk Assessment: Normal   Communication Communication Communication: No difficulties   Cognition Arousal/Alertness: Awake/alert Behavior During Therapy: WFL for tasks assessed/performed Overall Cognitive Status: Within Functional Limits for tasks assessed                     General Comments       Exercises Exercises: Other exercises Other Exercises Other Exercises: Pt instructed to perform bil. shoulder flexion 5-10 reps several times/day   Shoulder Instructions      Home Living Family/patient expects to be discharged to:: Private  residence Living Arrangements: Children Available Help at Discharge: Family;Available PRN/intermittently Type of Home: Apartment Home Access: Level entry     Home Layout: One level     Bathroom Shower/Tub: Tub/shower unit;Curtain Shower/tub characteristics: Architectural technologist: Standard                Prior Functioning/Environment Level of Independence: Independent        Comments: Pt reports she fatigued rapidly PTA, and was able to engage in activity for 10-15 minutes at a time.  She has adapted her environment and has placed chairs within easy access throughout her condo.      OT Diagnosis: Generalized weakness   OT Problem List: Decreased strength   OT Treatment/Interventions:      OT Goals(Current goals can be found in the care plan section) Acute Rehab OT Goals Patient Stated Goal: go home OT Goal Formulation: All assessment and education complete, DC therapy  OT Frequency:     Barriers to D/C:            Co-evaluation              End of Session Equipment Utilized During Treatment: Rolling walker Nurse Communication: Mobility status  Activity Tolerance: Patient tolerated treatment well Patient left: in chair;with call bell/phone within reach   Time: 1540-0867 OT Time Calculation (min): 33 min Charges:  OT General Charges $OT Visit: 1 Procedure OT Evaluation $Initial OT Evaluation Tier I: 1 Procedure OT Treatments $Self Care/Home Management : 8-22 mins G-Codes:    Linda [name] M 12-31-14, 1:52 PM

## 2014-12-19 ENCOUNTER — Ambulatory Visit: Payer: Self-pay

## 2014-12-19 ENCOUNTER — Other Ambulatory Visit: Payer: Self-pay

## 2014-12-19 ENCOUNTER — Encounter (HOSPITAL_COMMUNITY): Payer: Self-pay | Admitting: Cardiology

## 2014-12-19 DIAGNOSIS — I3139 Other pericardial effusion (noninflammatory): Secondary | ICD-10-CM | POA: Diagnosis present

## 2014-12-19 DIAGNOSIS — I5033 Acute on chronic diastolic (congestive) heart failure: Secondary | ICD-10-CM | POA: Diagnosis present

## 2014-12-19 DIAGNOSIS — I10 Essential (primary) hypertension: Secondary | ICD-10-CM

## 2014-12-19 DIAGNOSIS — Z789 Other specified health status: Secondary | ICD-10-CM

## 2014-12-19 DIAGNOSIS — J90 Pleural effusion, not elsewhere classified: Secondary | ICD-10-CM

## 2014-12-19 DIAGNOSIS — J910 Malignant pleural effusion: Secondary | ICD-10-CM

## 2014-12-19 DIAGNOSIS — I313 Pericardial effusion (noninflammatory): Secondary | ICD-10-CM | POA: Diagnosis present

## 2014-12-19 DIAGNOSIS — E876 Hypokalemia: Secondary | ICD-10-CM

## 2014-12-19 DIAGNOSIS — Z9689 Presence of other specified functional implants: Secondary | ICD-10-CM | POA: Diagnosis present

## 2014-12-19 LAB — BASIC METABOLIC PANEL
Anion gap: 9 (ref 5–15)
BUN: <5 mg/dL — ABNORMAL LOW (ref 6–20)
CALCIUM: 8.1 mg/dL — AB (ref 8.9–10.3)
CO2: 35 mmol/L — AB (ref 22–32)
Chloride: 93 mmol/L — ABNORMAL LOW (ref 101–111)
Creatinine, Ser: 0.65 mg/dL (ref 0.44–1.00)
GFR calc Af Amer: >60 mL/min (ref >60)
GFR calc non Af Amer: >60 mL/min (ref >60)
Glucose, Bld: 101 mg/dL — ABNORMAL HIGH (ref 65–99)
Potassium: 2.8 mmol/L — ABNORMAL LOW (ref 3.5–5.1)
SODIUM: 137 mmol/L (ref 135–145)

## 2014-12-19 LAB — CBC
HCT: 32 % — ABNORMAL LOW (ref 36.0–46.0)
Hemoglobin: 10.4 g/dL — ABNORMAL LOW (ref 12.0–15.0)
MCH: 29.9 pg (ref 26.0–34.0)
MCHC: 32.5 g/dL (ref 30.0–36.0)
MCV: 92 fL (ref 78.0–100.0)
Platelets: 224 10*3/uL (ref 150–400)
RBC: 3.48 MIL/uL — AB (ref 3.87–5.11)
RDW: 16.2 % — ABNORMAL HIGH (ref 11.5–15.5)
WBC: 6.3 10*3/uL (ref 4.0–10.5)

## 2014-12-19 LAB — CULTURE, BODY FLUID W GRAM STAIN -BOTTLE: Culture: NO GROWTH

## 2014-12-19 LAB — CULTURE, BODY FLUID-BOTTLE: CULTURE: NO GROWTH

## 2014-12-19 LAB — POTASSIUM: Potassium: 3.2 mmol/L — ABNORMAL LOW (ref 3.5–5.1)

## 2014-12-19 LAB — MAGNESIUM: Magnesium: 1.5 mg/dL — ABNORMAL LOW (ref 1.7–2.4)

## 2014-12-19 MED ORDER — POTASSIUM CHLORIDE CRYS ER 20 MEQ PO TBCR
60.0000 meq | EXTENDED_RELEASE_TABLET | Freq: Once | ORAL | Status: AC
  Administered 2014-12-19: 60 meq via ORAL
  Filled 2014-12-19: qty 3

## 2014-12-19 MED ORDER — POTASSIUM CHLORIDE CRYS ER 20 MEQ PO TBCR: 40.0000 meq | EXTENDED_RELEASE_TABLET | Freq: Once | ORAL | Status: AC

## 2014-12-19 MED ORDER — MAGNESIUM SULFATE 2 GM/50ML IV SOLN
2.0000 g | Freq: Once | INTRAVENOUS | Status: AC
  Administered 2014-12-19: 2 g via INTRAVENOUS

## 2014-12-19 MED ORDER — MAGNESIUM SULFATE 2 GM/50ML IV SOLN
INTRAVENOUS | Status: AC
  Filled 2014-12-19: qty 50

## 2014-12-19 MED ORDER — FUROSEMIDE 40 MG PO TABS
40.0000 mg | ORAL_TABLET | Freq: Two times a day (BID) | ORAL | Status: DC
  Administered 2014-12-19 (×2): 40 mg via ORAL
  Filled 2014-12-19 (×8): qty 1

## 2014-12-19 NOTE — Progress Notes (Signed)
Received two separates orders for PO potassium. Called for clarification. Dr. Sherral Hammers gave order to give 60 PO potassium. Will give potassium and continue to monitor.  Linda [name]

## 2014-12-19 NOTE — Progress Notes (Signed)
[place] TEAM 1 - Stepdown/ICU TEAM Progress Note  Linda [name] TLX:[id number] DOB: August 09, 1945 DOA: 12/13/2014 PCP: Leeanne Rio, PA-C  Admit HPI / Brief Narrative: 69 year old WF PMHx metastatic breast cancer status post bilateral mastectomies in 2013, GERD, and hypertension who presented for a scheduled follow-up echocardiogram and was found to have a large pericardial effusion. Patient did endorse orthopnea, dyspnea, and significant weight gain over a month. She also reported chest pain on coughing, generalized weakness and fatigue.   Patient was admitted by the Cardiology service on 6/29 and TCTS was consulted. Patient underwent pericardial window on 6/30 w/ 800 mL of hemorrhagic fluid drained. CT angiogram of the chest on 6/29 showed pulmonary embolism in the right lung and metastatic disease.  Vascular Surgery was consulted regarding anticoagulation and IVC filter. Oncology was consulted and after an extended discussion with the patient she decided to pursue palliative care.   HPI/Subjective: 7/5 A/O 4, NAD, anxious to be discharged. 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 -s/p window 6/30 per TCTS  - 7/5 pericardial tube removed   Malignant pleural effusion -S/P Lt chest tube per TCTS -Pathology report on pleural fluid obtained 6/30; MALIGNANT CELLS PRESENT, CONSISTENT WITH BREAST CARCINOMA -Patient and daughter request palliative care consult to address palliative care vs hospice -Patient has follow-up appointment with Dr.Gustav C Magrinat (oncology) on 7/17, to discuss treatment options if any  Acute pulmonary emboli -given hemorrhagic pericardial effusion, she is not a candidate for anticoagulation  - no IVC filter as pt to pursue palliative care   Hypertension -Currently stable  Recurrent widely metastatic breast cancer - Oncology recommended to continue anastrozole 1 mg  daily -No further addition of chemotherapy or brain MRI - now to pursue palliative care  -Consulted Palliative Care; patient wishes discussed palliative Care vs Hospice options   Anasarca - acute diastolic CHF  -remains significantly volume overloaded but making progress -Strict in and out -15.5 L  -Continue Lasix 40 mg BID - CHF Team following   Hypokalemia -Potassium goal > 4  -Total potassium for today 120 mg   Hypomagnesemia -Magnesium goal>2 -Magnesium 2 g IV 1  Constipation  -Resolved   Left cerumen impaction -Examination with otoscope reveals cerumen impaction but no evidence of otitis media  - irrigate canal and follow   Code Status:DNR Family Communication: family present at time of exam Disposition Plan: Palliative care vs hospice    Consultants: Dr.Clarence H Owen (TCTS) Dr.Gustav C Magrinat (oncology) Dr.Daniel R Bensimhon (cardiology)    Procedure/Significant Events: 6/30 left chest tube placed drained 400 serous fluid; pathology shows MALIGNANT CELLS PRESENT, CONSISTENT WITH BREAST CARCINOMA    Culture   Antibiotics: NA  DVT prophylaxis: SCD   Devices    LINES / TUBES:  Left chest tube 6/30>>    Continuous Infusions: . sodium chloride 10 mL/hr at 12/19/14 1600    Objective: VITAL SIGNS: Temp: 98.2 F (36.8 C) (07/05 1935) Temp Source: Oral (07/05 1935) BP: 123/54 mmHg (07/05 1515) Pulse Rate: 84 (07/05 1515) SPO2; FIO2:   Intake/Output Summary (Last 24 hours) at 12/19/14 2116 Last data filed at 12/19/14 1940  Gross per 24 hour  Intake    100 ml  Output   4250 ml  Net  -4150 ml     Exam: General: A/O 4, NAD, No acute respiratory distress Eyes: Negative headache, eye pain, double vision, scotomas, floaters, negative retinal hemorrhage ENT: Negative Runny nose, negative ear pain,  negative tinnitus, negative gingival bleeding Neck:  Negative scars, dressing over left neck area, negative lymphadenopathy,  JVD Lungs: Clear to auscultation right lung fields, positive crackles lingula/LLL. Left chest tube in place with significant serous drainage.  Cardiovascular: Regular rate and rhythm without murmur gallop or rub normal S1 and S2 Abdomen:negative abdominal pain, negative dysphagia, Nontender, nondistended, soft, bowel sounds positive, no rebound, no ascites, no appreciable mass Extremities: No significant cyanosis, clubbing, or edema bilateral lower extremities Psychiatric:  Negative depression, negative anxiety, negative fatigue, negative mania  Neurologic:  Cranial nerves II through XII intact, tongue/uvula midline, all extremities muscle strength 5/5, sensation intact throughout, negative dysarthria, negative expressive aphasia, negative receptive aphasia.      Data Reviewed: Basic Metabolic Panel:  Recent Labs Lab 12/14/14 0242 12/15/14 0420 12/16/14 0620 12/18/14 0500 12/19/14 0440 12/19/14 1232  NA 135 138 136 135 137  --   K 4.1 4.3 4.0 2.9* 2.8* 3.2*  CL 102 105 100* 95* 93*  --   CO2 24 28 30  32 35*  --   GLUCOSE 140* 130* 122* 99 101*  --   BUN 34* 22* 11 7 <5*  --   CREATININE 1.12* 0.89 0.74 0.62 0.65  --   CALCIUM 9.0 7.7* 7.7* 7.7* 8.1*  --   MG  --   --   --   --   --  1.5*   Liver Function Tests:  Recent Labs Lab 12/13/14 0924 12/14/14 0242 12/16/14 0620  AST 20 31 18   ALT 27 30 18   ALKPHOS 79 70 60  BILITOT 0.51 0.5 0.9  PROT 6.6 6.9 5.0*  ALBUMIN 3.4* 3.4* 2.4*   No results for input(s): LIPASE, AMYLASE in the last 168 hours. No results for input(s): AMMONIA in the last 168 hours. CBC:  Recent Labs Lab 12/13/14 0923 12/15/14 0420 12/16/14 0620 12/19/14 0440  WBC 8.3 7.7 8.0 6.3  NEUTROABS 6.2  --   --   --   HGB 10.7* 10.3* 10.6* 10.4*  HCT 32.8* 31.9* 33.4* 32.0*  MCV 92.4 93.0 94.6 92.0  PLT 265 232 217 224   Cardiac Enzymes: No results for input(s): CKTOTAL, CKMB, CKMBINDEX, TROPONINI in the last 168 hours. BNP (last 3  results)  Recent Labs  09/10/14 1740 12/08/14 1758 12/13/14 1703  BNP 80.9 29.3 21.1    ProBNP (last 3 results) No results for input(s): PROBNP in the last 8760 hours.  CBG: No results for input(s): GLUCAP in the last 168 hours.  Recent Results (from the past 240 hour(s))  MRSA PCR Screening     Status: None   Collection Time: 12/13/14  4:20 PM  Result Value Ref Range Status   MRSA by PCR NEGATIVE NEGATIVE Final    Comment:        The GeneXpert MRSA Assay (FDA approved for NASAL specimens only), is one component of a comprehensive MRSA colonization surveillance program. It is not intended to diagnose MRSA infection nor to guide or monitor treatment for MRSA infections.   Culture, body fluid-bottle     Status: None   Collection Time: 12/14/14  8:01 AM  Result Value Ref Range Status   Specimen Description FLUID PERICARDIAL  Final   Special Requests   Final    BOTTLES DRAWN AEROBIC AND ANAEROBIC 10CC PATIENT ON FOLLOWING ZINACEF   Culture NO GROWTH 5 DAYS  Final   Report Status 12/19/2014 FINAL  Final  Gram stain     Status: None   Collection Time: 12/14/14  8:01 AM  Result Value Ref Range Status   Specimen Description FLUID PERICARDIAL  Final   Special Requests PATIENT ON FOLLOWING ZINACEF  Final   Gram Stain   Final    MODERATE WBC PRESENT, PREDOMINANTLY MONONUCLEAR NO ORGANISMS SEEN    Report Status 12/14/2014 FINAL  Final  Culture, body fluid-bottle     Status: None   Collection Time: 12/14/14  8:05 AM  Result Value Ref Range Status   Specimen Description FLUID LEFT PLEURAL  Final   Special Requests PATIENT ON FOLLOWING ZINACEF  Final   Culture NO GROWTH 5 DAYS  Final   Report Status 12/19/2014 FINAL  Final  Gram stain     Status: None   Collection Time: 12/14/14  8:05 AM  Result Value Ref Range Status   Specimen Description FLUID LEFT PLEURAL  Final   Special Requests PATIENT ON FOLLOWING ZINACEF  Final   Gram Stain   Final    MODERATE WBC PRESENT,BOTH  PMN AND MONONUCLEAR NO ORGANISMS SEEN    Report Status 12/14/2014 FINAL  Final     Studies:  Recent x-ray studies have been reviewed in detail by the Attending Physician  Scheduled Meds:  Scheduled Meds: . anastrozole  1 mg Oral Daily  . B-complex with vitamin C  1 tablet Oral Daily  . bisacodyl  10 mg Oral Daily  . cholecalciferol  1,000 Units Oral Daily  . furosemide  40 mg Oral BID  . polyethylene glycol  17 g Oral Daily  . senna-docusate  1 tablet Oral BID  . tobramycin-dexamethasone  1 drop Left Eye Q4H while awake    Time spent on care of this patient: 40 mins   WOODS, Geraldo Docker , MD  [facility] Office  [phone] Pager - [phone]  On-Call/Text Page:      [email or web address]      password [email or web address]  If 7PM-7AM, please contact night-coverage [email or web address] Password [email or web address] 12/19/2014, 9:16 PM   LOS: 6 days   Care during the described time interval was provided by me .  I have reviewed this patient's available data, including medical history, events of note, physical examination, and all test results as part of my evaluation. I have personally reviewed and interpreted all radiology studies.   Dia Crawford, MD [phone] Pager

## 2014-12-19 NOTE — Progress Notes (Signed)
Linda [name]   DOB:1945-12-24   TM#:[id number]   OLI#:[id number]  Subjective: [name] is taking walks, looking forward to getting the tubes out, and hoping to get home this week. -- She had BM "fireworks" yesterday and now appears to be back to her usual bowel routine. Her breathing overall is much improved and she has good sats off O2, no significant cough, no pleurisy. No family in room   Objective: middle aged White woman examined in bed Filed Vitals:   12/19/14 0400  BP: 126/57  Pulse: 93  Temp:   Resp: 21    Body mass index is 35.47 kg/(m^2).  Intake/Output Summary (Last 24 hours) at 12/19/14 0752 Last data filed at 12/19/14 0400  Gross per 24 hour  Intake    610 ml  Output   3820 ml  Net  -3210 ml     Sclerae unicteric  Oropharynx no thrush  Lungs rales L base, CT in place  Heart regular rate and rhythm  Abdomen soft, obese, NT  MSK anasarca improved, LUE lymphedema as before  Neuro nonfocal, well-oriented, postivie affect   CBG (last 3)  No results for input(s): GLUCAP in the last 72 hours.   Labs:  Lab Results  Component Value Date   WBC 6.3 12/19/2014   HGB 10.4* 12/19/2014   HCT 32.0* 12/19/2014   MCV 92.0 12/19/2014   PLT 224 12/19/2014   NEUTROABS 6.2 12/13/2014    @LASTCHEMISTRY @  Urine Studies No results for input(s): UHGB, CRYS in the last 72 hours.  Invalid input(s): UACOL, UAPR, USPG, UPH, UTP, UGL, UKET, UBIL, UNIT, UROB, [place], UEPI, UWBC, Junie Panning [place], [place], [place]  Basic Metabolic Panel:  Recent Labs Lab 12/14/14 0242 12/15/14 0420 12/16/14 0620 12/18/14 0500 12/19/14 0440  NA 135 138 136 135 137  K 4.1 4.3 4.0 2.9* 2.8*  CL 102 105 100* 95* 93*  CO2 24 28 30  32 35*  GLUCOSE 140* 130* 122* 99 101*  BUN 34* 22* 11 7 <5*  CREATININE 1.12* 0.89 0.74 0.62 0.65  CALCIUM 9.0 7.7* 7.7* 7.7* 8.1*   GFR Estimated Creatinine Clearance: 88.5 mL/min (by C-G formula based on Cr of 0.65). Liver Function Tests:  Recent Labs Lab  12/13/14 0924 12/14/14 0242 12/16/14 0620  AST 20 31 18   ALT 27 30 18   ALKPHOS 79 70 60  BILITOT 0.51 0.5 0.9  PROT 6.6 6.9 5.0*  ALBUMIN 3.4* 3.4* 2.4*   No results for input(s): LIPASE, AMYLASE in the last 168 hours. No results for input(s): AMMONIA in the last 168 hours. Coagulation profile  Recent Labs Lab 12/13/14 1703  INR 1.31    CBC:  Recent Labs Lab 12/13/14 0923 12/15/14 0420 12/16/14 0620 12/19/14 0440  WBC 8.3 7.7 8.0 6.3  NEUTROABS 6.2  --   --   --   HGB 10.7* 10.3* 10.6* 10.4*  HCT 32.8* 31.9* 33.4* 32.0*  MCV 92.4 93.0 94.6 92.0  PLT 265 232 217 224   Cardiac Enzymes: No results for input(s): CKTOTAL, CKMB, CKMBINDEX, TROPONINI in the last 168 hours. BNP: Invalid input(s): POCBNP CBG: No results for input(s): GLUCAP in the last 168 hours. D-Dimer No results for input(s): DDIMER in the last 72 hours. Hgb A1c No results for input(s): HGBA1C in the last 72 hours. Lipid Profile No results for input(s): CHOL, HDL, LDLCALC, TRIG, CHOLHDL, LDLDIRECT in the last 72 hours. Thyroid function studies No results for input(s): TSH, T4TOTAL, T3FREE, THYROIDAB in the last 72 hours.  Invalid  input(s): FREET3 Anemia work up No results for input(s): VITAMINB12, FOLATE, FERRITIN, TIBC, IRON, RETICCTPCT in the last 72 hours. Microbiology Recent Results (from the past 240 hour(s))  MRSA PCR Screening     Status: None   Collection Time: 12/13/14  4:20 PM  Result Value Ref Range Status   MRSA by PCR NEGATIVE NEGATIVE Final    Comment:        The GeneXpert MRSA Assay (FDA approved for NASAL specimens only), is one component of a comprehensive MRSA colonization surveillance program. It is not intended to diagnose MRSA infection nor to guide or monitor treatment for MRSA infections.   Culture, body fluid-bottle     Status: None (Preliminary result)   Collection Time: 12/14/14  8:01 AM  Result Value Ref Range Status   Specimen Description FLUID  PERICARDIAL  Final   Special Requests   Final    BOTTLES DRAWN AEROBIC AND ANAEROBIC 10CC PATIENT ON FOLLOWING ZINACEF   Culture NO GROWTH 4 DAYS  Final   Report Status PENDING  Incomplete  Gram stain     Status: None   Collection Time: 12/14/14  8:01 AM  Result Value Ref Range Status   Specimen Description FLUID PERICARDIAL  Final   Special Requests PATIENT ON FOLLOWING ZINACEF  Final   Gram Stain   Final    MODERATE WBC PRESENT, PREDOMINANTLY MONONUCLEAR NO ORGANISMS SEEN    Report Status 12/14/2014 FINAL  Final  Culture, body fluid-bottle     Status: None (Preliminary result)   Collection Time: 12/14/14  8:05 AM  Result Value Ref Range Status   Specimen Description FLUID LEFT PLEURAL  Final   Special Requests PATIENT ON FOLLOWING ZINACEF  Final   Culture NO GROWTH 4 DAYS  Final   Report Status PENDING  Incomplete  Gram stain     Status: None   Collection Time: 12/14/14  8:05 AM  Result Value Ref Range Status   Specimen Description FLUID LEFT PLEURAL  Final   Special Requests PATIENT ON FOLLOWING ZINACEF  Final   Gram Stain   Final    MODERATE WBC PRESENT,BOTH PMN AND MONONUCLEAR NO ORGANISMS SEEN    Report Status 12/14/2014 FINAL  Final      Studies:  Dg Chest Port 1 View  12/18/2014   CLINICAL DATA:  Chest tube.  EXAM: PORTABLE CHEST - 1 VIEW  COMPARISON:  12/17/2014  FINDINGS: Right Port-A-Cath and left chest tube remain in place, unchanged. No pneumothorax. There is cardiomegaly. Vascular congestion. Perihilar and bibasilar atelectasis or infiltrates. Small bilateral effusions.  IMPRESSION: Cardiomegaly with vascular congestion.  Low lung volumes with bibasilar atelectasis and small effusions.  No pneumothorax.   Electronically Signed   By: Rolm Baptise M.D.   On: 12/18/2014 08:40   Patient: [name], Linda [name]: 12/14/2014 Client: [facility] Accession: [id number] Received: 12/14/2014 Linda [name] DOB: 1945/10/26 Age: 69 Gender: F Reported:  12/15/2014 [address] Patient Ph: [phone] MRN #: [id number] [place], [place] [address] Visit #: [id number].[place]-[facility] Chart #: Phone: [phone] Fax: CC: REPORT OF SURGICAL PATHOLOGY FINAL DIAGNOSIS Diagnosis Pericardium, resection - MILDLY INFLAMED MESOTHELIAL LINED FIBROADIPOSE TISSUE. - THERE IS NO EVIDENCE OF MALIGNANCY. Enid Cutter MD Pathologist, Electronic Signature   Assessment: 69 y.o. [place] woman   (1) status post bilateral breast biopsies 07/24/2011, showing,    (a) on the right, a clinical T2 N0, stage IIA papillary/ductal breast cancer, estrogen and progesterone receptor positive, HER-2 negative, with an MIB-1 of 10%;   (  b) on the left, a clinical T3 N1, stage IIIA invasive ductal carcinoma, grade 3, triple positive, with an MIB-1 of 60%.  (2) Status post 4 dose dense cycles of doxorubicin/ cyclophosphamide followed by 4 dose dense cycles of paclitaxel and trastuzumab completed 12/09/2011  (3) the trastuzumab was continued for a total of one year (to 11/01/2012). Final echo on 11/04/2012 showing a well preserved ejection fraction of 55-60%.  (4) s/p bilateral mastectomies 01/21/2012 showing  (a) on the Right, an 8 mm invasive papillary carcinoma, grade 1, ypT1b ypN0  (b) on the Left, multiple microscopic foci of residual Invasive ductal carcinoma with evidence of dermal lymphatic involvement, pyT1a/T4 pyN0  (5) Postmastectomy radiation, completed 04/15/2012  (6) Started anastrazole 04/16/2012; normal dexa scan 05/19/2014 at the [facility] 09/11/2014 (7) CT angiogram 09/11/2014 shows new left pericardial effusion and new mediastinal and hilar lymphadenopathy; there were no suspicious upper abdominal findings; Cytology from the left effusion 09/11/2014 ([id number]) shows malignant cells which are HER-2 positive  (8) Whole body bone scan on 09/25/14 showed metastatic foci throughout axial and appendicular  skeleton. Areas of most potential concern include disease in the thoracic spine, disease in the right acetabulum, right superior pubic ramus and possibly proximal right femur, disease in the right humeral shaft and in both femoral shafts.  (8) Started trastuzumab/pertuzumab 09/26/2014, to be repeated every 21 days indefinitely (a) echocardiogram 12/13/2014 shows a well preserved ejection fraction.  (9) Brain MRI on 09/25/14 was positive for numerous small enhancing brain mets and calvarium/bone mets  (a) whole brain radiation on 10/09/14--10/27/2014  (10) started zolendronate 11/29/2014, to be repeated every 12 weeks  (11) left pleural effusion re-tapped 12/08/2014 (a) left chest tube placement 12/14/2014  (12) echo 12/13/2014 shows large pericardial effusion (a) pericardial window placement 12/14/2014; fluid is hemorrhagic; path negative  (13) Right-sided pulmonary emboli noted on CT scan 12/13/2014     Plan: We discussed the pathology report and from my point of view the decisions made last week are still the best choice for her. She is recovering remarkably well and I would expect she will be home before the weekend. -- She will be at her house, with her son Shanon Brow there in the evenings. She will be under the care of [facility] and I will see her 7/17 in the office. At that time we will review her situation again and I expect reaffirm the decisions made next week, for a supportive/ palliative approach as opposed to aggressive chemotherapy.  Greatly appreciate your help to this patient!   Chauncey Cruel, MD 12/19/2014  7:52 AM Medical Oncology and Hematology [facility] [address] [place],  [address] Tel. [phone]    Fax. [phone]

## 2014-12-19 NOTE — Progress Notes (Addendum)
Patient ID: Linda [name], female   DOB: 28-Apr-1946, 69 y.o.   MRN: [id number] Advanced Heart Failure Rounding Note   Subjective:    Continues to improve with diuresis. Sore at pericardial and pleural tube sites.  Otherwise breathing well, walking in hall with walker. Weight down > 20 lbs now with diuresis.    Objective:   Weight Range:  Vital Signs:   Temp:  [97.9 F (36.6 C)-98.6 F (37 C)] 98.3 F (36.8 C) (07/05 0355) Pulse Rate:  [85-98] 93 (07/05 0400) Resp:  [14-22] 21 (07/05 0400) BP: (108-126)/(56-66) 126/57 mmHg (07/05 0400) SpO2:  [73 %-98 %] 93 % (07/05 0400) Weight:  [240 lb 4.8 oz (109 kg)] 240 lb 4.8 oz (109 kg) (07/04 1000) Last BM Date: 12/18/14  Weight change: Filed Weights   12/14/14 0144 12/15/14 0442 12/18/14 1000  Weight: 267 lb 6.7 oz (121.3 kg) 264 lb 15.9 oz (120.2 kg) 240 lb 4.8 oz (109 kg)    Intake/Output:   Intake/Output Summary (Last 24 hours) at 12/19/14 0805 Last data filed at 12/19/14 0400  Gross per 24 hour  Intake    600 ml  Output   3515 ml  Net  -2915 ml     Physical Exam: General:  Alopecic. Ill appearing HEENT: normal Neck: supple. JVP 7 cm. Carotids 2+ bilat; no bruits. No lymphadenopathy or thryomegaly appreciated. Cor: PMI nonpalpable. Regular rate & rhythm. No rubs, gallops or murmurs. Lungs: decreased BS at bases bilaterally.  Abdomen: soft, nontender, +distended. No hepatosplenomegaly. No bruits or masses. Good bowel sounds. Extremities: no cyanosis, clubbing, rash.  Trace ankle edema.  Neuro: alert & orientedx3, cranial nerves grossly intact. moves all 4 extremities w/o difficulty. Affect pleasant  Telemetry: SR 90s  Labs: Basic Metabolic Panel:  Recent Labs Lab 12/14/14 0242 12/15/14 0420 12/16/14 0620 12/18/14 0500 12/19/14 0440  NA 135 138 136 135 137  K 4.1 4.3 4.0 2.9* 2.8*  CL 102 105 100* 95* 93*  CO2 24 28 30  32 35*  GLUCOSE 140* 130* 122* 99 101*  BUN 34* 22* 11 7 <5*  CREATININE 1.12* 0.89  0.74 0.62 0.65  CALCIUM 9.0 7.7* 7.7* 7.7* 8.1*    Liver Function Tests:  Recent Labs Lab 12/13/14 0924 12/14/14 0242 12/16/14 0620  AST 20 31 18   ALT 27 30 18   ALKPHOS 79 70 60  BILITOT 0.51 0.5 0.9  PROT 6.6 6.9 5.0*  ALBUMIN 3.4* 3.4* 2.4*   No results for input(s): LIPASE, AMYLASE in the last 168 hours. No results for input(s): AMMONIA in the last 168 hours.  CBC:  Recent Labs Lab 12/13/14 0923 12/15/14 0420 12/16/14 0620 12/19/14 0440  WBC 8.3 7.7 8.0 6.3  NEUTROABS 6.2  --   --   --   HGB 10.7* 10.3* 10.6* 10.4*  HCT 32.8* 31.9* 33.4* 32.0*  MCV 92.4 93.0 94.6 92.0  PLT 265 232 217 224    Cardiac Enzymes: No results for input(s): CKTOTAL, CKMB, CKMBINDEX, TROPONINI in the last 168 hours.  BNP: BNP (last 3 results)  Recent Labs  09/10/14 1740 12/08/14 1758 12/13/14 1703  BNP 80.9 29.3 21.1    ProBNP (last 3 results) No results for input(s): PROBNP in the last 8760 hours.    Other results:  Imaging: Dg Chest Port 1 View  12/18/2014   CLINICAL DATA:  Chest tube.  EXAM: PORTABLE CHEST - 1 VIEW  COMPARISON:  12/17/2014  FINDINGS: Right Port-A-Cath and left chest tube remain in place, unchanged. No  pneumothorax. There is cardiomegaly. Vascular congestion. Perihilar and bibasilar atelectasis or infiltrates. Small bilateral effusions.  IMPRESSION: Cardiomegaly with vascular congestion.  Low lung volumes with bibasilar atelectasis and small effusions.  No pneumothorax.   Electronically Signed   By: Rolm Baptise M.D.   On: 12/18/2014 08:40     Medications:     Scheduled Medications: . acetaminophen  1,000 mg Oral 4 times per day   Or  . acetaminophen (TYLENOL) oral liquid 160 mg/5 mL  1,000 mg Oral 4 times per day  . anastrozole  1 mg Oral Daily  . B-complex with vitamin C  1 tablet Oral Daily  . bisacodyl  10 mg Oral Daily  . cholecalciferol  1,000 Units Oral Daily  . polyethylene glycol  17 g Oral Daily  . potassium chloride  40 mEq Oral Once   . potassium chloride  60 mEq Oral Once  . senna-docusate  1 tablet Oral BID  . tobramycin-dexamethasone  1 drop Left Eye Q4H while awake    Infusions: . sodium chloride 1,000 mL (12/17/14 1913)    PRN Medications: ALPRAZolam, benzonatate, fentaNYL (SUBLIMAZE) injection, HYDROcodone-homatropine, lidocaine-prilocaine, nitroGLYCERIN, ondansetron (ZOFRAN) IV, oxyCODONE, traMADol, zolpidem   Assessment:   1.  Large hemorrhagic pericardial effusion with early tamponade: suspect malignant.     -s/p window 6/30 2. Large pleural effusion: suspect malignant.    --s/p chest tube 6/30 3. Metastatic/recurrent breast CA 4. Acute diastolic HF with anasarca: EF 60-65% by echo.  5. Acute respiratory failure 6. Small RUL/RLL PE: Decided against IVC filter given plan for palliative approach.  7. DNR/DNI   Plan/Discussion:    Volume overload improved, weight down > 20 lbs with diuresis.  I am going to transition over to po Lasix today, 40 mg bid.  Needs replacement of K.     Pericardial drain to come out today, pleural tube still in place with significant ongoing drainage.  CVTS following.     Plan for home with hospice care given metastatic breast cancer.   Loralie Champagne, MD Length of Stay: 6  Advanced Heart Failure Team Pager [phone] (M-F; 7a - 4p)  Please contact [facility] for night-coverage after hours (4p -7a ) and weekends on [email or web address]

## 2014-12-19 NOTE — Progress Notes (Addendum)
[address] VernonSuite 411       [place] [address]             [phone]      5 Days Post-Op Procedure(s) (LRB): SUBXYPHOID PERICARDIAL WINDOW (N/A) TRANSESOPHAGEAL ECHOCARDIOGRAM (TEE) (N/A) CHEST TUBE INSERTION (Left) Subjective: Not having pain, breathing is pretty comfortable  Objective: Vital signs in last 24 hours: Temp:  [97.9 F (36.6 C)-98.6 F (37 C)] 98.5 F (36.9 C) (07/05 0700) Pulse Rate:  [88-98] 93 (07/05 0400) Cardiac Rhythm:  [-] Normal sinus rhythm (07/05 0400) Resp:  [14-22] 21 (07/05 0400) BP: (111-126)/(56-66) 126/57 mmHg (07/05 0400) SpO2:  [73 %-98 %] 93 % (07/05 0400) Weight:  [240 lb 4.8 oz (109 kg)] 240 lb 4.8 oz (109 kg) (07/04 1000)  Hemodynamic parameters for last 24 hours:    Intake/Output from previous day: 07/04 0701 - 07/05 0700 In: 610 [P.O.:510; I.V.:100] Out: 4070 [Urine:3850; Chest Tube:220] Intake/Output this shift:    General appearance: alert, cooperative and no distress Heart: regular rate and rhythm Lungs: clear to auscultation bilaterally Wound: incis healing well  Lab Results:  Recent Labs  12/19/14 0440  WBC 6.3  HGB 10.4*  HCT 32.0*  PLT 224   BMET:  Recent Labs  12/18/14 0500 12/19/14 0440  NA 135 137  K 2.9* 2.8*  CL 95* 93*  CO2 32 35*  GLUCOSE 99 101*  BUN 7 <5*  CREATININE 0.62 0.65  CALCIUM 7.7* 8.1*    PT/INR: No results for input(s): LABPROT, INR in the last 72 hours. ABG    Component Value Date/Time   PHART 7.424 12/15/2014 0605   HCO3 23.9 12/15/2014 0605   TCO2 25.0 12/15/2014 0605   ACIDBASEDEF 0.0 12/15/2014 0605   O2SAT 91.7 12/15/2014 0605   CBG (last 3)  No results for input(s): GLUCAP in the last 72 hours.  Meds Scheduled Meds: . acetaminophen  1,000 mg Oral 4 times per day   Or  . acetaminophen (TYLENOL) oral liquid 160 mg/5 mL  1,000 mg Oral 4 times per day  . anastrozole  1 mg Oral Daily  . B-complex with vitamin C  1 tablet Oral Daily  . bisacodyl  10  mg Oral Daily  . cholecalciferol  1,000 Units Oral Daily  . furosemide  40 mg Oral BID  . polyethylene glycol  17 g Oral Daily  . senna-docusate  1 tablet Oral BID  . tobramycin-dexamethasone  1 drop Left Eye Q4H while awake   Continuous Infusions: . sodium chloride 1,000 mL (12/17/14 1913)   PRN Meds:.ALPRAZolam, benzonatate, fentaNYL (SUBLIMAZE) injection, HYDROcodone-homatropine, lidocaine-prilocaine, nitroGLYCERIN, ondansetron (ZOFRAN) IV, oxyCODONE, traMADol, zolpidem  Xrays Dg Chest Port 1 View  12/18/2014   CLINICAL DATA:  Chest tube.  EXAM: PORTABLE CHEST - 1 VIEW  COMPARISON:  12/17/2014  FINDINGS: Right Port-A-Cath and left chest tube remain in place, unchanged. No pneumothorax. There is cardiomegaly. Vascular congestion. Perihilar and bibasilar atelectasis or infiltrates. Small bilateral effusions.  IMPRESSION: Cardiomegaly with vascular congestion.  Low lung volumes with bibasilar atelectasis and small effusions.  No pneumothorax.   Electronically Signed   By: Rolm Baptise M.D.   On: 12/18/2014 08:40    Assessment/Plan: S/P Procedure(s) (LRB): SUBXYPHOID PERICARDIAL WINDOW (N/A) TRANSESOPHAGEAL ECHOCARDIOGRAM (TEE) (N/A) CHEST TUBE INSERTION (Left)  1 remove pericardial drain, leave L CT for now   LOS: 6 days    GOLD,WAYNE E 12/19/2014  Agree with above. Repeat CXR in am. Can remove pleural tube if  drainage continues to decrease.

## 2014-12-19 NOTE — Progress Notes (Signed)
Physical Therapy Treatment Patient Details Name: Linda [name] MRN: [id number] DOB: 1946-05-02 Today's Date: 12/19/2014    History of Present Illness Pt with a history of Metastatic Breast Cancer Bilateral Breast dx 07/2011) S/P Rt Mastectomy and Left Modified Radical Mastectomy S/P Radiation Rx, now with brain mets who presented to the ED with complaints of increased SOB and cough as well as increased Swelling of the Left Arm x 2-3 days. w/u revealed a large Left sided Pleural Effusion, and a Venous Duplex US was performed of the Left Arm and was negative for a DVT.      PT Comments    Progressing well. Stability without device improving.  Will await removal of some lines and tubes to be able to increase challenge to balance.   Follow Up Recommendations  Home health PT     Equipment Recommendations  None recommended by PT    Recommendations for Other Services       Precautions / Restrictions Precautions Precautions: Fall Precaution Comments: chest tubes x 2    Mobility  Bed Mobility Overal bed mobility: Needs Assistance Bed Mobility: Sit to Supine       Sit to supine: Modified independent (Device/Increase time)      Transfers Overall transfer level: Needs assistance Equipment used: Rolling walker (2 wheeled);None Transfers: Sit to/from Stand Sit to Stand: Supervision Stand pivot transfers: Supervision       General transfer comment: steady without asssit  Ambulation/Gait Ambulation/Gait assistance: Supervision Ambulation Distance (Feet): 380 Feet Assistive device: None;Rolling walker (2 wheeled) Gait Pattern/deviations: Step-through pattern Gait velocity: pt able to appreciably increase speed to cue   General Gait Details: majority of time without assistive device.  pt steady and working on changing cadence.  Once some tubes and lines are removed we can work more on balance challenge.   Stairs            Wheelchair Mobility    Modified  [place] (Stroke Patients Only)       Balance Overall balance assessment: Needs assistance Sitting-balance support: No upper extremity supported Sitting balance-Leahy Scale: Good     Standing balance support: No upper extremity supported Standing balance-Leahy Scale: Good                      Cognition Arousal/Alertness: Awake/alert Behavior During Therapy: WFL for tasks assessed/performed Overall Cognitive Status: Within Functional Limits for tasks assessed                      Exercises      General Comments        Pertinent Vitals/Pain Pain Assessment: No/denies pain    Home Living                      Prior Function            PT Goals (current goals can now be found in the care plan section) Acute Rehab PT Goals Patient Stated Goal: go home PT Goal Formulation: With patient Time For Goal Achievement: 12/30/14 Potential to Achieve Goals: Good Progress towards PT goals: Progressing toward goals    Frequency  Min 3X/week    PT Plan Current plan remains appropriate    Co-evaluation             End of Session   Activity Tolerance: Patient tolerated treatment well Patient left: with call bell/phone within reach;with family/visitor present;in bed (to have 1 CT removed.)  Time: 3112-1624 PT Time Calculation (min) (ACUTE ONLY): 19 min  Charges:  $Gait Training: 8-22 mins                    G Codes:      Lindon Kiel, Tessie Fass 12/19/2014, 12:24 PM 12/19/2014  Donnella Sham, PT [phone]  (pager)

## 2014-12-20 ENCOUNTER — Inpatient Hospital Stay (HOSPITAL_COMMUNITY): Payer: Medicare Other

## 2014-12-20 LAB — BASIC METABOLIC PANEL
Anion gap: 8 (ref 5–15)
BUN: <5 mg/dL — ABNORMAL LOW (ref 6–20)
CO2: 35 mmol/L — AB (ref 22–32)
Calcium: 8.1 mg/dL — ABNORMAL LOW (ref 8.9–10.3)
Chloride: 95 mmol/L — ABNORMAL LOW (ref 101–111)
Creatinine, Ser: 0.67 mg/dL (ref 0.44–1.00)
GFR calc Af Amer: >60 mL/min (ref >60)
GLUCOSE: 102 mg/dL — AB (ref 65–99)
POTASSIUM: 3.2 mmol/L — AB (ref 3.5–5.1)
SODIUM: 138 mmol/L (ref 135–145)

## 2014-12-20 LAB — CBC
HCT: 32.8 % — ABNORMAL LOW (ref 36.0–46.0)
Hemoglobin: 10.4 g/dL — ABNORMAL LOW (ref 12.0–15.0)
MCH: 29.5 pg (ref 26.0–34.0)
MCHC: 31.7 g/dL (ref 30.0–36.0)
MCV: 93.2 fL (ref 78.0–100.0)
Platelets: 222 10*3/uL (ref 150–400)
RBC: 3.52 MIL/uL — ABNORMAL LOW (ref 3.87–5.11)
RDW: 16.1 % — AB (ref 11.5–15.5)
WBC: 6.4 10*3/uL (ref 4.0–10.5)

## 2014-12-20 MED ORDER — POTASSIUM CHLORIDE CRYS ER 20 MEQ PO TBCR
40.0000 meq | EXTENDED_RELEASE_TABLET | Freq: Once | ORAL | Status: AC
  Administered 2014-12-20: 40 meq via ORAL
  Filled 2014-12-20: qty 2

## 2014-12-20 MED ORDER — FUROSEMIDE 20 MG PO TABS
20.0000 mg | ORAL_TABLET | Freq: Every day | ORAL | Status: DC
  Administered 2014-12-20 – 2014-12-21 (×2): 20 mg via ORAL
  Filled 2014-12-20 (×3): qty 1

## 2014-12-20 MED ORDER — POTASSIUM CHLORIDE CRYS ER 20 MEQ PO TBCR: 20.0000 meq | EXTENDED_RELEASE_TABLET | Freq: Every day | ORAL | Status: DC

## 2014-12-20 MED ORDER — NYSTATIN 100000 UNIT/GM EX CREA
TOPICAL_CREAM | Freq: Three times a day (TID) | CUTANEOUS | Status: DC
  Administered 2014-12-20 – 2014-12-21 (×3): via TOPICAL
  Filled 2014-12-20: qty 15

## 2014-12-20 MED ORDER — POTASSIUM CHLORIDE CRYS ER 20 MEQ PO TBCR
40.0000 meq | EXTENDED_RELEASE_TABLET | Freq: Every day | ORAL | Status: DC
  Administered 2014-12-20 – 2014-12-21 (×2): 40 meq via ORAL
  Filled 2014-12-20 (×4): qty 2

## 2014-12-20 NOTE — Progress Notes (Signed)
Patient ID: Linda [name], female   DOB: [birth year], 69 y.o.   MRN: [id number] Advanced Heart Failure Rounding Note   Subjective:    Yesterday brisk diuresis noted on po lasix. . Negative 3.4 liters. Weight down 12 pounds. Mag 1.5 and she received 2 grams mag.   Denies SOB.    Objective:   Weight Range:  Vital Signs:   Temp:  [97.8 F (36.6 C)-98.6 F (37 C)] 97.9 F (36.6 C) (07/06 0751) Pulse Rate:  [84-97] 97 (07/05 2300) Resp:  [17-21] 21 (07/05 2300) BP: (123-128)/(50-59) 128/50 mmHg (07/05 2300) SpO2:  [95 %-98 %] 97 % (07/05 2300) Weight:  [228 lb 6.3 oz (103.6 kg)] 228 lb 6.3 oz (103.6 kg) (07/06 0500) Last BM Date: 12/18/14  Weight change: Filed Weights   12/15/14 0442 12/18/14 1000 12/20/14 0500  Weight: 264 lb 15.9 oz (120.2 kg) 240 lb 4.8 oz (109 kg) 228 lb 6.3 oz (103.6 kg)    Intake/Output:   Intake/Output Summary (Last 24 hours) at 12/20/14 0826 Last data filed at 12/20/14 0753  Gross per 24 hour  Intake    260 ml  Output   3700 ml  Net  -3440 ml     Physical Exam: General:  Alopecic. Ill appearing. In bed HEENT: normal Neck: supple. JVP 5-6 cm. Carotids 2+ bilat; no bruits. No lymphadenopathy or thryomegaly appreciated. Cor: PMI nonpalpable. Regular rate & rhythm. No rubs, gallops or murmurs. Lungs: decreased BS at bases bilaterally.  Abdomen: soft, nontender, +distended. No hepatosplenomegaly. No bruits or masses. Good bowel sounds. Extremities: no cyanosis, clubbing, rash.  No edema.   Neuro: alert & orientedx3, cranial nerves grossly intact. moves all 4 extremities w/o difficulty. Affect pleasant  Telemetry: SR 90s  Labs: Basic Metabolic Panel:  Recent Labs Lab 12/15/14 0420 12/16/14 0620 12/18/14 0500 12/19/14 0440 12/19/14 1232 12/20/14 0433  NA 138 136 135 137  --  138  K 4.3 4.0 2.9* 2.8* 3.2* 3.2*  CL 105 100* 95* 93*  --  95*  CO2 28 30 32 35*  --  35*  GLUCOSE 130* 122* 99 101*  --  102*  BUN 22* 11 7 <5*  --  <5*   CREATININE 0.89 0.74 0.62 0.65  --  0.67  CALCIUM 7.7* 7.7* 7.7* 8.1*  --  8.1*  MG  --   --   --   --  1.5*  --     Liver Function Tests:  Recent Labs Lab 12/13/14 0924 12/14/14 0242 12/16/14 0620  AST 20 31 18   ALT 27 30 18   ALKPHOS 79 70 60  BILITOT 0.51 0.5 0.9  PROT 6.6 6.9 5.0*  ALBUMIN 3.4* 3.4* 2.4*   No results for input(s): LIPASE, AMYLASE in the last 168 hours. No results for input(s): AMMONIA in the last 168 hours.  CBC:  Recent Labs Lab 12/13/14 0923 12/15/14 0420 12/16/14 0620 12/19/14 0440 12/20/14 0433  WBC 8.3 7.7 8.0 6.3 6.4  NEUTROABS 6.2  --   --   --   --   HGB 10.7* 10.3* 10.6* 10.4* 10.4*  HCT 32.8* 31.9* 33.4* 32.0* 32.8*  MCV 92.4 93.0 94.6 92.0 93.2  PLT 265 232 217 224 222    Cardiac Enzymes: No results for input(s): CKTOTAL, CKMB, CKMBINDEX, TROPONINI in the last 168 hours.  BNP: BNP (last 3 results)  Recent Labs  09/10/14 1740 12/08/14 1758 12/13/14 1703  BNP 80.9 29.3 21.1    ProBNP (last 3 results) No results for  input(s): PROBNP in the last 8760 hours.    Other results:  Imaging: Dg Chest Port 1 View  12/20/2014   CLINICAL DATA:  Pleural effusion, chest tube treatment  EXAM: PORTABLE CHEST - 1 VIEW  COMPARISON:  Portable chest x-ray of December 18, 2014  FINDINGS: The lower left chest tube appears to been removed. The upper chest tube is stable with its tip projecting over the posterior aspect of the fourth rib. No significant pleural effusion is observed currently. There is persistent left lower lobe atelectasis or pneumonia. On the right the lung is adequately inflated. There is basilar atelectasis and small pleural effusion, unchanged. The cardiac silhouette remains enlarged. The pulmonary vascularity is engorged. The power port appliance tip projects over the distal third of the SVC. The observed bony thorax is unremarkable.  IMPRESSION: 1. Interval removal of the lower left chest tube. The upper chest tube is unchanged.  There is no significant left pleural effusion and no left pneumothorax. 2. Persistent bibasilar atelectasis or infiltrate greater on the left than on the right. A small right pleural effusion is suspected. 3. Mild cardiac enlargement with pulmonary vascular congestion consistent with CHF.   Electronically Signed   By: David  Martinique M.D.   On: 12/20/2014 07:39     Medications:     Scheduled Medications: . anastrozole  1 mg Oral Daily  . B-complex with vitamin C  1 tablet Oral Daily  . bisacodyl  10 mg Oral Daily  . cholecalciferol  1,000 Units Oral Daily  . furosemide  20 mg Oral Daily  . polyethylene glycol  17 g Oral Daily  . potassium chloride  20 mEq Oral Daily  . potassium chloride  40 mEq Oral Once  . senna-docusate  1 tablet Oral BID  . tobramycin-dexamethasone  1 drop Left Eye Q4H while awake    Infusions: . sodium chloride 10 mL/hr at 12/19/14 1600    PRN Medications: ALPRAZolam, benzonatate, fentaNYL (SUBLIMAZE) injection, HYDROcodone-homatropine, lidocaine-prilocaine, nitroGLYCERIN, ondansetron (ZOFRAN) IV, oxyCODONE, traMADol, zolpidem   Assessment:   1.  Large hemorrhagic pericardial effusion with early tamponade: suspect malignant.     -s/p window 6/30 2. Large pleural effusion: suspect malignant.    --s/p chest tube 6/30 3. Metastatic/recurrent breast CA 4. Acute diastolic HF with anasarca: EF 60-65% by echo.  5. Acute respiratory failure 6. Small RUL/RLL PE: Decided against IVC filter given plan for palliative approach.  7. DNR/DNI 8. Hypokalemia   9. Hypomagesium Plan/Discussion:    Volume status much improved. Weight down another 12  lbs with diuresis.  Overall weight down 40 pounds. Cut back oral lasix to 20 mg daily. Supplement K.   Renal function stable.    Pericardial drain out 7/6. Pleural tube still in place with 50 cc over the last 24 hours.  CVTS following.     Plan for home with hospice care given metastatic breast cancer.   CLEGG,AMY, NP-C   Length of Stay: 7  Advanced Heart Failure Team Pager [phone] (M-F; 7a - 4p)  Please contact [facility] for night-coverage after hours (4p -7a ) and weekends on [email or web address]  Patient seen with NP, agree with the above note.  Vigorous diuresis again.  Decrease Lasix to 20 mg daily.  May need to adjust as outpatient.  Think chest tube can come out today with 50 cc output/24 hrs, CVTS to see.  Loralie Champagne 12/20/2014 9:13 AM

## 2014-12-20 NOTE — Progress Notes (Signed)
      [address]       [place] [address]             [phone]       6 Days Post-Op Procedure(s) (LRB): SUBXYPHOID PERICARDIAL WINDOW (N/A) TRANSESOPHAGEAL ECHOCARDIOGRAM (TEE) (N/A) CHEST TUBE INSERTION (Left)  Subjective: Patient tired this morning. No specific complaints  Objective: Vital signs in last 24 hours: Temp:  [97.8 F (36.6 C)-98.6 F (37 C)] 97.9 F (36.6 C) (07/06 0751) Pulse Rate:  [84-97] 97 (07/05 2300) Cardiac Rhythm:  [-] Normal sinus rhythm (07/05 2345) Resp:  [17-21] 21 (07/05 2300) BP: (123-128)/(50-59) 128/50 mmHg (07/05 2300) SpO2:  [95 %-98 %] 97 % (07/05 2300) Weight:  [228 lb 6.3 oz (103.6 kg)] 228 lb 6.3 oz (103.6 kg) (07/06 0500)     Intake/Output from previous day: 07/05 0701 - 07/06 0700 In: 270 [P.O.:180; I.V.:90] Out: 3690 [Urine:3580; Chest Tube:110]   Physical Exam:  Cardiovascular: RRR. Pulmonary: Diminished at bases L>R; no rales, wheezes, or rhonchi. Abdomen: Soft, non tender, bowel sounds present. Extremities: Bilateral lower extremity edema. Wounds: Subxiphoid wound is clean and dry.   Chest Tube: to water seal  Lab Results: CBC:  Recent Labs  12/19/14 0440 12/20/14 0433  WBC 6.3 6.4  HGB 10.4* 10.4*  HCT 32.0* 32.8*  PLT 224 222   BMET:   Recent Labs  12/19/14 0440 12/19/14 1232 12/20/14 0433  NA 137  --  138  K 2.8* 3.2* 3.2*  CL 93*  --  95*  CO2 35*  --  35*  GLUCOSE 101*  --  102*  BUN <5*  --  <5*  CREATININE 0.65  --  0.67  CALCIUM 8.1*  --  8.1*    PT/INR: No results for input(s): LABPROT, INR in the last 72 hours. ABG:  INR: Will add last result for INR, ABG once components are confirmed Will add last 4 CBG results once components are confirmed  Assessment/Plan:  1. CV - SR in the 70's. Pericardial drain removed yesterday. 2.  Pulmonary - On room air. Left chest tube with 200 cc for 24 hours (100 cc last 12 hours). Ouput continues to decrease. Hope to remove left  chest tube soon . CXR shows patient rotated to the left, CHF, no pneumothorax, moderate cardiomegaly, small right pleural effusion, and bibasilar atelectasis. Encourage incentive spirometer and flutter valve. 3. H and H stable at 10.4 and 32.8 4. Central line removed yesterday. Foley remains. Patient does not wish it to be removed yet as not very ambulatory. Will discuss with Dr. Prescott Gum. 5. Supplement potassium 6. On Lasix 40 mg po bid for volume overload  ZIMMERMAN,DONIELLE MPA-C 12/20/2014,8:08 AM

## 2014-12-20 NOTE — Progress Notes (Signed)
[facility] TEAM 1 - Stepdown/ICU TEAM Progress Note  Linda [name] YQM:[id number] DOB: [birth year] DOA: 12/13/2014 PCP: Leeanne Rio, PA-C  Admit HPI / Brief Narrative: 69 year old female with metastatic breast cancer status post bilateral mastectomies in 2013, GERD, and hypertension who presented for a scheduled follow-up echocardiogram and was found to have a large pericardial effusion. Patient did endorse orthopnea, dyspnea, and significant weight gain over a month. She also reported chest pain on coughing, generalized weakness and fatigue.   Patient was admitted by the Cardiology service on 6/29 and TCTS was consulted. Patient underwent pericardial window on 6/30 w/ 800 mL of hemorrhagic fluid drained. CT angiogram of the chest on 6/29 showed pulmonary embolism in the right lung and metastatic disease.   Vascular Surgery was consulted regarding anticoagulation and IVC filter. Oncology was consulted and after an extended discussion with the patient she decided to pursue palliative care.   HPI/Subjective: The patient is resting comfortably in bed and is in remarkably good spirits.  She denies any new complaints.  She is excited about the possibility of going home, perhaps as early as tomorrow.  She denies chest pain shortness of breath or abdominal pain at this time.  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 -s/p window 6/30 per TCTS - pericardial tube removed without incident 7/4   Malignant pleural effusion -S/P L chest tube per TCTS - tube care per TCTS - see their recs per tube care/suction, etc (possible need for Pleurx catheter) - high volume drainage appears to be waning with possible discontinuation of chest tube today - if chest tube able to be removed today patient could potentially be discharged home tomorrow if she remains stable overnight   Acute pulmonary emboli -given hemorrhagic pericardial effusion, she is not a candidate  for anticoagulation - no IVC filter as pt to pursue palliative care    Hypertension -Currently stable  Recurrent widely metastatic breast cancer - Oncology recommended to continue anastrozole, no further addition of chemotherapy or brain MRI - now to pursue palliative care - appreciate ongoing care of Dr. Jana Hakim   Anasarca - acute diastolic CHF making significant progress in regards to diuresis - peak weight 122 kg and presently down to 103.6 - currently net -16.7 L  - CHF Team  titrating diuretics  Hypokalemia -Due to ongoing high-dose diuretic - continue to replace and follow   Constipation  -Resolved   Left cerumen impaction -Examination with otoscope 7/4 revealed cerumen impaction but no evidence of otitis media   Code Status: NO CODE - DNR  Family Communication: No family present at time of exam Disposition Plan: SDU until chest tubes removed - possible d/c home in next 24hrs is CT able to be removed today and pt remains stable post removal   Consultants: TCTS Cardiology - CHF Team  Oncology   Antibiotics: none  DVT prophylaxis: SCDs  Objective: Blood pressure 120/58, pulse 82, temperature 97.9 F (36.6 C), temperature source Oral, resp. rate 20, height 5\' 9"  (1.753 m), weight 103.6 kg (228 lb 6.3 oz), SpO2 95 %.  Intake/Output Summary (Last 24 hours) at 12/20/14 1041 Last data filed at 12/20/14 0800  Gross per 24 hour  Intake    520 ml  Output   3750 ml  Net  -3230 ml   Exam: General: No acute respiratory distress - alert and conversant - jovial  Lungs: Clear to auscultation bilaterally - poor air movement in B bases   Cardiovascular: Regular rate and rhythm  without murmur   Abdomen: Nontender, protuberent, soft, bowel sounds positive, no rebound, no ascites, no appreciable mass Extremities: No significant cyanosis, or clubbing - edema markedly improved   Data Reviewed: Basic Metabolic Panel:  Recent Labs Lab 12/15/14 0420 12/16/14 0620  12/18/14 0500 12/19/14 0440 12/19/14 1232 12/20/14 0433  NA 138 136 135 137  --  138  K 4.3 4.0 2.9* 2.8* 3.2* 3.2*  CL 105 100* 95* 93*  --  95*  CO2 28 30 32 35*  --  35*  GLUCOSE 130* 122* 99 101*  --  102*  BUN 22* 11 7 <5*  --  <5*  CREATININE 0.89 0.74 0.62 0.65  --  0.67  CALCIUM 7.7* 7.7* 7.7* 8.1*  --  8.1*  MG  --   --   --   --  1.5*  --     CBC:  Recent Labs Lab 12/15/14 0420 12/16/14 0620 12/19/14 0440 12/20/14 0433  WBC 7.7 8.0 6.3 6.4  HGB 10.3* 10.6* 10.4* 10.4*  HCT 31.9* 33.4* 32.0* 32.8*  MCV 93.0 94.6 92.0 93.2  PLT 232 217 224 222    Liver Function Tests:  Recent Labs Lab 12/14/14 0242 12/16/14 0620  AST 31 18  ALT 30 18  ALKPHOS 70 60  BILITOT 0.5 0.9  PROT 6.9 5.0*  ALBUMIN 3.4* 2.4*   Coags:  Recent Labs Lab 12/13/14 1703  INR 1.31    Recent Labs Lab 12/13/14 1703  APTT 27    Recent Results (from the past 240 hour(s))  MRSA PCR Screening     Status: None   Collection Time: 12/13/14  4:20 PM  Result Value Ref Range Status   MRSA by PCR NEGATIVE NEGATIVE Final    Comment:        The GeneXpert MRSA Assay (FDA approved for NASAL specimens only), is one component of a comprehensive MRSA colonization surveillance program. It is not intended to diagnose MRSA infection nor to guide or monitor treatment for MRSA infections.   Culture, body fluid-bottle     Status: None   Collection Time: 12/14/14  8:01 AM  Result Value Ref Range Status   Specimen Description FLUID PERICARDIAL  Final   Special Requests   Final    BOTTLES DRAWN AEROBIC AND ANAEROBIC 10CC PATIENT ON FOLLOWING ZINACEF   Culture NO GROWTH 5 DAYS  Final   Report Status 12/19/2014 FINAL  Final  Gram stain     Status: None   Collection Time: 12/14/14  8:01 AM  Result Value Ref Range Status   Specimen Description FLUID PERICARDIAL  Final   Special Requests PATIENT ON FOLLOWING ZINACEF  Final   Gram Stain   Final    MODERATE WBC PRESENT, PREDOMINANTLY  MONONUCLEAR NO ORGANISMS SEEN    Report Status 12/14/2014 FINAL  Final  Culture, body fluid-bottle     Status: None   Collection Time: 12/14/14  8:05 AM  Result Value Ref Range Status   Specimen Description FLUID LEFT PLEURAL  Final   Special Requests PATIENT ON FOLLOWING ZINACEF  Final   Culture NO GROWTH 5 DAYS  Final   Report Status 12/19/2014 FINAL  Final  Gram stain     Status: None   Collection Time: 12/14/14  8:05 AM  Result Value Ref Range Status   Specimen Description FLUID LEFT PLEURAL  Final   Special Requests PATIENT ON FOLLOWING ZINACEF  Final   Gram Stain   Final    MODERATE WBC PRESENT,BOTH PMN  AND MONONUCLEAR NO ORGANISMS SEEN    Report Status 12/14/2014 FINAL  Final     Studies:   Recent x-ray studies have been reviewed in detail by the Attending Physician  Scheduled Meds:  Scheduled Meds: . anastrozole  1 mg Oral Daily  . B-complex with vitamin C  1 tablet Oral Daily  . bisacodyl  10 mg Oral Daily  . cholecalciferol  1,000 Units Oral Daily  . furosemide  20 mg Oral Daily  . polyethylene glycol  17 g Oral Daily  . potassium chloride  40 mEq Oral Daily  . senna-docusate  1 tablet Oral BID  . tobramycin-dexamethasone  1 drop Left Eye Q4H while awake    Time spent on care of this patient: 25 mins   [facility] T , MD   [facility] Office  [phone] Pager - Text Page per Shea Evans as per below:  On-Call/Text Page:      [email or web address]      password [email or web address]  If 7PM-7AM, please contact night-coverage [email or web address] Password [email or web address] 12/20/2014, 10:41 AM   LOS: 7 days

## 2014-12-21 ENCOUNTER — Inpatient Hospital Stay (HOSPITAL_COMMUNITY): Payer: Medicare Other

## 2014-12-21 DIAGNOSIS — I10 Essential (primary) hypertension: Secondary | ICD-10-CM | POA: Diagnosis present

## 2014-12-21 DIAGNOSIS — C50919 Malignant neoplasm of unspecified site of unspecified female breast: Secondary | ICD-10-CM | POA: Diagnosis present

## 2014-12-21 DIAGNOSIS — I312 Hemopericardium, not elsewhere classified: Secondary | ICD-10-CM

## 2014-12-21 DIAGNOSIS — I5031 Acute diastolic (congestive) heart failure: Secondary | ICD-10-CM | POA: Diagnosis present

## 2014-12-21 DIAGNOSIS — C7800 Secondary malignant neoplasm of unspecified lung: Secondary | ICD-10-CM

## 2014-12-21 LAB — BASIC METABOLIC PANEL
Anion gap: 7 (ref 5–15)
BUN: <5 mg/dL — ABNORMAL LOW (ref 6–20)
CHLORIDE: 99 mmol/L — AB (ref 101–111)
CO2: 30 mmol/L (ref 22–32)
Calcium: 7.6 mg/dL — ABNORMAL LOW (ref 8.9–10.3)
Creatinine, Ser: 0.58 mg/dL (ref 0.44–1.00)
GFR calc non Af Amer: >60 mL/min (ref >60)
Glucose, Bld: 95 mg/dL (ref 65–99)
POTASSIUM: 3.1 mmol/L — AB (ref 3.5–5.1)
SODIUM: 136 mmol/L (ref 135–145)

## 2014-12-21 MED ORDER — SPIRONOLACTONE 25 MG PO TABS
12.5000 mg | ORAL_TABLET | Freq: Every day | ORAL | Status: DC
  Administered 2014-12-21: 12.5 mg via ORAL
  Filled 2014-12-21 (×2): qty 1

## 2014-12-21 MED ORDER — POTASSIUM CHLORIDE CRYS ER 20 MEQ PO TBCR
40.0000 meq | EXTENDED_RELEASE_TABLET | Freq: Once | ORAL | Status: AC
  Administered 2014-12-21: 40 meq via ORAL
  Filled 2014-12-21: qty 2

## 2014-12-21 NOTE — Progress Notes (Signed)
Physical Therapy Treatment Patient Details Name: Linda [name] MRN: [id number] DOB: 03/06/1946 Today's Date: 12/21/2014    History of Present Illness Pt with a history of Metastatic Breast Cancer Bilateral Breast dx 07/2011) S/P Rt Mastectomy and Left Modified Radical Mastectomy S/P Radiation Rx, now with brain mets who presented to the ED with complaints of increased SOB and cough as well as increased Swelling of the Left Arm x 2-3 days. w/u revealed a large Left sided Pleural Effusion, and a Venous Duplex US was performed of the Left Arm and was negative for a DVT.      PT Comments    Has progressed steadily.  Steady gait, no lob with balance challenge.   Follow Up Recommendations  Home health PT     Equipment Recommendations  None recommended by PT    Recommendations for Other Services       Precautions / Restrictions Precautions Precautions: Fall    Mobility  Bed Mobility Overal bed mobility: Needs Assistance Bed Mobility: Supine to Sit;Sit to Supine     Supine to sit: Modified independent (Device/Increase time) Sit to supine: Modified independent (Device/Increase time)      Transfers Overall transfer level: Needs assistance Equipment used: Rolling walker (2 wheeled);None Transfers: Sit to/from Stand Sit to Stand: Modified independent (Device/Increase time)         General transfer comment: steady without asssit  Ambulation/Gait Ambulation/Gait assistance: Supervision Ambulation Distance (Feet): 330 Feet Assistive device: None Gait Pattern/deviations: Step-through pattern Gait velocity: able to increase speed to age appropriately levels   General Gait Details: steady, fluid gait without assistive device   Stairs Stairs: Yes Stairs assistance: Supervision Stair Management: One rail Left;Alternating pattern;Forwards Number of Stairs: 2 General stair comments: safe with rails  Wheelchair Mobility    Modified Rankin (Stroke Patients Only)        Balance Overall balance assessment: Needs assistance Sitting-balance support: No upper extremity supported Sitting balance-Leahy Scale: Good     Standing balance support: No upper extremity supported Standing balance-Leahy Scale: Good               High level balance activites: Backward walking;Direction changes;Turns;Sudden stops;Head turns High Level Balance Comments: no overt deviation    Cognition Arousal/Alertness: Awake/alert Behavior During Therapy: WFL for tasks assessed/performed Overall Cognitive Status: Within Functional Limits for tasks assessed                      Exercises      General Comments        Pertinent Vitals/Pain Pain Assessment: No/denies pain    Home Living                      Prior Function            PT Goals (current goals can now be found in the care plan section) Acute Rehab PT Goals Patient Stated Goal: go home PT Goal Formulation: With patient Time For Goal Achievement: 12/30/14 Potential to Achieve Goals: Good Progress towards PT goals: Progressing toward goals    Frequency  Min 3X/week    PT Plan Current plan remains appropriate    Co-evaluation             End of Session   Activity Tolerance: Patient tolerated treatment well Patient left: with call bell/phone within reach;with family/visitor present;in bed     Time: 1321-1340 PT Time Calculation (min) (ACUTE ONLY): 19 min  Charges:  $Gait Training: 8-22 mins  G Codes:      Kirstina Leinweber, Tessie Fass 12/21/2014, 4:48 PM 12/21/2014  Donnella Sham, [place] [phone]  (pager)

## 2014-12-21 NOTE — Progress Notes (Signed)
Patient ID: Linda [name], female   DOB: 09/11/1945, 69 y.o.   MRN: [id number] Advanced Heart Failure Rounding Note   Subjective:    Yesterday lasix cut back to 20 mg po daily. Weight down another pound. Weight down another pound.    Denies SOB.    Objective:   Weight Range:  Vital Signs:   Temp:  [98 F (36.7 C)-98.3 F (36.8 C)] 98.3 F (36.8 C) (07/07 0721) Pulse Rate:  [90-101] 98 (07/07 0500) Resp:  [14-23] 21 (07/07 0500) BP: (90-132)/(58-79) 115/67 mmHg (07/07 0500) SpO2:  [92 %-96 %] 94 % (07/07 0500) Weight:  [227 lb 15.3 oz (103.4 kg)] 227 lb 15.3 oz (103.4 kg) (07/07 0500) Last BM Date: 12/20/14  Weight change: Filed Weights   12/18/14 1000 12/20/14 0500 12/21/14 0500  Weight: 240 lb 4.8 oz (109 kg) 228 lb 6.3 oz (103.6 kg) 227 lb 15.3 oz (103.4 kg)    Intake/Output:   Intake/Output Summary (Last 24 hours) at 12/21/14 0828 Last data filed at 12/21/14 0547  Gross per 24 hour  Intake 877.84 ml  Output   1147 ml  Net -269.16 ml     Physical Exam: General:  Alopecic. Ill appearing. In bed HEENT: normal Neck: supple. JVP 5-6 cm. Carotids 2+ bilat; no bruits. No lymphadenopathy or thryomegaly appreciated. Cor: PMI nonpalpable. Regular rate & rhythm. No rubs, gallops or murmurs. Lungs: decreased BS at bases bilaterally.  Abdomen: soft, nontender, +distended. No hepatosplenomegaly. No bruits or masses. Good bowel sounds. Extremities: no cyanosis, clubbing, rash.  No edema.   Neuro: alert & orientedx3, cranial nerves grossly intact. moves all 4 extremities w/o difficulty. Affect pleasant  Telemetry: SR 90s  Labs: Basic Metabolic Panel:  Recent Labs Lab 12/16/14 0620 12/18/14 0500 12/19/14 0440 12/19/14 1232 12/20/14 0433 12/21/14 0235  NA 136 135 137  --  138 136  K 4.0 2.9* 2.8* 3.2* 3.2* 3.1*  CL 100* 95* 93*  --  95* 99*  CO2 30 32 35*  --  35* 30  GLUCOSE 122* 99 101*  --  102* 95  BUN 11 7 <5*  --  <5* <5*  CREATININE 0.74 0.62 0.65  --   0.67 0.58  CALCIUM 7.7* 7.7* 8.1*  --  8.1* 7.6*  MG  --   --   --  1.5*  --   --     Liver Function Tests:  Recent Labs Lab 12/16/14 0620  AST 18  ALT 18  ALKPHOS 60  BILITOT 0.9  PROT 5.0*  ALBUMIN 2.4*   No results for input(s): LIPASE, AMYLASE in the last 168 hours. No results for input(s): AMMONIA in the last 168 hours.  CBC:  Recent Labs Lab 12/15/14 0420 12/16/14 0620 12/19/14 0440 12/20/14 0433  WBC 7.7 8.0 6.3 6.4  HGB 10.3* 10.6* 10.4* 10.4*  HCT 31.9* 33.4* 32.0* 32.8*  MCV 93.0 94.6 92.0 93.2  PLT 232 217 224 222    Cardiac Enzymes: No results for input(s): CKTOTAL, CKMB, CKMBINDEX, TROPONINI in the last 168 hours.  BNP: BNP (last 3 results)  Recent Labs  09/10/14 1740 12/08/14 1758 12/13/14 1703  BNP 80.9 29.3 21.1    ProBNP (last 3 results) No results for input(s): PROBNP in the last 8760 hours.    Other results:  Imaging: Dg Chest Port 1 View  12/21/2014   CLINICAL DATA:  Sub xiphoid pericardial window creation, chest tube check  EXAM: PORTABLE CHEST - 1 VIEW  COMPARISON:  Portable chest x-ray  of December 20, 2014  FINDINGS: The left upper chest tube is unchanged in position. There is no pneumothorax. No significant pleural effusion on the left is observed. On the right there is small amount of pleural fluid as well as basilar atelectasis which is stable. The cardiac silhouette is mildly enlarged but stable. The pulmonary vascularity is less engorged today. The power port appliance tip projects over the distal third of the SVC.  IMPRESSION: 1. Stable appearance of the left chest tube. There is no pneumothorax or significant left pleural effusion. 2. Persistent right lower lobe atelectasis or infiltrate and small right pleural effusion. 3. Decreased pulmonary vascular congestion.   Electronically Signed   By: David  Martinique M.D.   On: 12/21/2014 08:01   Dg Chest Port 1 View  12/20/2014   CLINICAL DATA:  Pleural effusion, chest tube treatment  EXAM:  PORTABLE CHEST - 1 VIEW  COMPARISON:  Portable chest x-ray of December 18, 2014  FINDINGS: The lower left chest tube appears to been removed. The upper chest tube is stable with its tip projecting over the posterior aspect of the fourth rib. No significant pleural effusion is observed currently. There is persistent left lower lobe atelectasis or pneumonia. On the right the lung is adequately inflated. There is basilar atelectasis and small pleural effusion, unchanged. The cardiac silhouette remains enlarged. The pulmonary vascularity is engorged. The power port appliance tip projects over the distal third of the SVC. The observed bony thorax is unremarkable.  IMPRESSION: 1. Interval removal of the lower left chest tube. The upper chest tube is unchanged. There is no significant left pleural effusion and no left pneumothorax. 2. Persistent bibasilar atelectasis or infiltrate greater on the left than on the right. A small right pleural effusion is suspected. 3. Mild cardiac enlargement with pulmonary vascular congestion consistent with CHF.   Electronically Signed   By: David  Martinique M.D.   On: 12/20/2014 07:39     Medications:     Scheduled Medications: . anastrozole  1 mg Oral Daily  . B-complex with vitamin C  1 tablet Oral Daily  . bisacodyl  10 mg Oral Daily  . cholecalciferol  1,000 Units Oral Daily  . furosemide  20 mg Oral Daily  . nystatin cream   Topical TID  . polyethylene glycol  17 g Oral Daily  . potassium chloride  40 mEq Oral Daily  . senna-docusate  1 tablet Oral BID  . tobramycin-dexamethasone  1 drop Left Eye Q4H while awake    Infusions: . sodium chloride 10 mL/hr at 12/20/14 2240    PRN Medications: ALPRAZolam, benzonatate, fentaNYL (SUBLIMAZE) injection, HYDROcodone-homatropine, lidocaine-prilocaine, nitroGLYCERIN, ondansetron (ZOFRAN) IV, oxyCODONE, traMADol, zolpidem   Assessment:   1.  Large hemorrhagic pericardial effusion with early tamponade: suspect malignant.      -s/p window 6/30 2. Large pleural effusion: suspect malignant.    --s/p chest tube 6/30 3. Metastatic/recurrent breast CA 4. Acute diastolic HF with anasarca: EF 60-65% by echo.  5. Acute respiratory failure 6. Small RUL/RLL PE: Decided against IVC filter given plan for palliative approach.  7. DNR/DNI 8. Hypokalemia   9. Hypomagesium Plan/Discussion:    Volume status stable. Weight down another pound.Overall weight down 41 pounds. Continue lasix 20 mg daily. Add 12.5 spiro.  Supplement K.   Renal function stable.    Pericardial drain out 7/6. Pleural tube still in place with 70 cc over the last 24 hours.  CVTS following.     Plan for home with  hospice care given metastatic breast cancer.   CLEGG,AMY, NP-C  Length of Stay: 8  Advanced Heart Failure Team Pager [phone] (M-F; 7a - 4p)  Please contact [facility] for night-coverage after hours (4p -7a ) and weekends on [email or web address]  Patient seen with NP, agree with the above note.  Continue Lasix 20 mg daily.  Hopefully pleural tube out today and home with hospice.   Loralie Champagne 12/21/2014 9:26 AM

## 2014-12-21 NOTE — Progress Notes (Signed)
[facility] DAILY ICU PROGRESS NOTE                   [address]            [place] [address]          [phone]   7 Days Post-Op Procedure(s) (LRB): SUBXYPHOID PERICARDIAL WINDOW (N/A) TRANSESOPHAGEAL ECHOCARDIOGRAM (TEE) (N/A) CHEST TUBE INSERTION (Left)  Total Length of Stay:  LOS: 8 days   Subjective: Doing better clinically, ambulating in halls. Breathing is comfortable   Objective: Vital signs in last 24 hours: Temp:  [98 F (36.7 C)-98.3 F (36.8 C)] 98.3 F (36.8 C) (07/07 0721) Pulse Rate:  [90-101] 97 (07/07 0721) Cardiac Rhythm:  [-] Sinus tachycardia (07/07 0721) Resp:  [14-25] 25 (07/07 0721) BP: (94-132)/(58-78) 118/67 mmHg (07/07 0721) SpO2:  [92 %-96 %] 93 % (07/07 0721) Weight:  [227 lb 15.3 oz (103.4 kg)] 227 lb 15.3 oz (103.4 kg) (07/07 0500)  Filed Weights   12/18/14 1000 12/20/14 0500 12/21/14 0500  Weight: 240 lb 4.8 oz (109 kg) 228 lb 6.3 oz (103.6 kg) 227 lb 15.3 oz (103.4 kg)    Weight change: -7.1 oz (-0.2 kg)   Hemodynamic parameters for last 24 hours:    Intake/Output from previous day: 07/06 0701 - 07/07 0700 In: 1157.8 [P.O.:780; I.V.:377.8] Out: 1297 [Urine:1226; Stool:1; Chest Tube:70]  Intake/Output this shift:    Current Meds: Scheduled Meds: . anastrozole  1 mg Oral Daily  . B-complex with vitamin C  1 tablet Oral Daily  . bisacodyl  10 mg Oral Daily  . cholecalciferol  1,000 Units Oral Daily  . furosemide  20 mg Oral Daily  . nystatin cream   Topical TID  . polyethylene glycol  17 g Oral Daily  . potassium chloride  40 mEq Oral Daily  . senna-docusate  1 tablet Oral BID  . spironolactone  12.5 mg Oral Daily  . tobramycin-dexamethasone  1 drop Left Eye Q4H while awake   Continuous Infusions: . sodium chloride 10 mL/hr at 12/20/14 2240   PRN Meds:.ALPRAZolam, benzonatate, fentaNYL (SUBLIMAZE) injection, HYDROcodone-homatropine, lidocaine-prilocaine, nitroGLYCERIN, ondansetron (ZOFRAN) IV, oxyCODONE,  traMADol, zolpidem  General appearance: alert, cooperative and no distress Heart: regular rate and rhythm Lungs: clear to auscultation bilaterally Abdomen: soft, nontender Extremities: some LE edema Wound: incis ok  Lab Results: CBC: Recent Labs  12/19/14 0440 12/20/14 0433  WBC 6.3 6.4  HGB 10.4* 10.4*  HCT 32.0* 32.8*  PLT 224 222   BMET:  Recent Labs  12/20/14 0433 12/21/14 0235  NA 138 136  K 3.2* 3.1*  CL 95* 99*  CO2 35* 30  GLUCOSE 102* 95  BUN <5* <5*  CREATININE 0.67 0.58  CALCIUM 8.1* 7.6*    PT/INR: No results for input(s): LABPROT, INR in the last 72 hours. Radiology: Dg Chest Port 1 View  12/21/2014   CLINICAL DATA:  Sub xiphoid pericardial window creation, chest tube check  EXAM: PORTABLE CHEST - 1 VIEW  COMPARISON:  Portable chest x-ray of December 20, 2014  FINDINGS: The left upper chest tube is unchanged in position. There is no pneumothorax. No significant pleural effusion on the left is observed. On the right there is small amount of pleural fluid as well as basilar atelectasis which is stable. The cardiac silhouette is mildly enlarged but stable. The pulmonary vascularity is less engorged today. The power port appliance tip projects over the distal third of the SVC.  IMPRESSION: 1. Stable appearance of  the left chest tube. There is no pneumothorax or significant left pleural effusion. 2. Persistent right lower lobe atelectasis or infiltrate and small right pleural effusion. 3. Decreased pulmonary vascular congestion.   Electronically Signed   By: David  Martinique M.D.   On: 12/21/2014 08:01     Assessment/Plan: S/P Procedure(s) (LRB): SUBXYPHOID PERICARDIAL WINDOW (N/A) TRANSESOPHAGEAL ECHOCARDIOGRAM (TEE) (N/A) CHEST TUBE INSERTION (Left)  1 improving clinically, physically stronger 2 d/c foley 3 D/C chest tube- no sig drainage, CXR is stable     GOLD,WAYNE E 12/21/2014 12:46 PM

## 2014-12-21 NOTE — Progress Notes (Signed)
Both chest tube and f/c d/c'd per MD order. Pt tolerated procedure well. Will continue to monitor.

## 2014-12-21 NOTE — Progress Notes (Signed)
[facility] TEAM 1 - Stepdown/ICU TEAM Progress Note  Linda [name] SFK:[id number] DOB: 21-Feb-1946 DOA: 12/13/2014 PCP: Leeanne Rio, PA-C  Admit HPI / Brief Narrative: 69 year old WF PMHx metastatic breast cancer status post bilateral mastectomies in 2013, GERD, and hypertension who presented for a scheduled follow-up echocardiogram and was found to have a large pericardial effusion. Patient did endorse orthopnea, dyspnea, and significant weight gain over a month. She also reported chest pain on coughing, generalized weakness and fatigue.   Patient was admitted by the Cardiology service on 6/29 and [facility] was consulted. Patient underwent pericardial window on 6/30 w/ 800 mL of hemorrhagic fluid drained. CT angiogram of the chest on 6/29 showed pulmonary embolism in the right lung and metastatic disease.  Vascular Surgery was consulted regarding anticoagulation and IVC filter. Oncology was consulted and after an extended discussion with the patient she decided to pursue palliative care.   HPI/Subjective: 7/7 A/O 4, NAD, anxious to be discharged. 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  Acute pulmonary emboli -given hemorrhagic pericardial effusion, she is not a candidate for anticoagulation  - no IVC filter as pt to pursue palliative care   Hypertension -Currently stable  Recurrent widely metastatic breast  cancer - Oncology recommended to continue anastrozole 1 mg daily -No further addition of chemotherapy or brain MRI - now to pursue palliative care  -She will be under the care of [facility]   Anasarca - acute diastolic CHF  -Strict in and out -17 L  -Continue Lasix 20 mg daily - CHF Team following   Hypokalemia -Potassium goal > 4   Hypomagnesemia -Magnesium goal>2   Constipation  -Resolved   Left cerumen impaction -Examination with otoscope reveals cerumen impaction but no evidence of otitis media  - irrigate canal and follow   Code Status:DNR Family Communication: No family present at time of exam Disposition Plan: Home with [facility]    Consultants: Dr.Clarence H Owen ([facility]) Dr.Gustav C Magrinat (oncology) Dr.Daniel R Bensimhon (cardiology)    Procedure/Significant Events: 6/30 left chest tube placed drained 400 serous fluid; pathology shows MALIGNANT CELLS PRESENT, CONSISTENT WITH BREAST CARCINOMA    Culture   Antibiotics: NA  DVT prophylaxis: SCD   Devices    LINES / TUBES:  Left chest tube 6/30>>    Continuous Infusions: . sodium chloride 10 mL/hr at 12/20/14 2240    Objective: VITAL SIGNS: Temp: 98.3 F (36.8 C) (07/07 1552) Temp Source: Oral (07/07 1552) BP: 130/70 mmHg (07/07 1552) Pulse Rate: 106 (07/07 1552) SPO2; FIO2:   Intake/Output Summary (Last 24 hours) at 12/21/14 1907 Last data filed at 12/21/14 0547  Gross per 24 hour  Intake 347.84 ml  Output    645 ml  Net -297.16 ml     Exam: General: A/O 4, NAD, No acute respiratory distress Eyes: Negative headache, eye pain, double vision, scotomas, floaters, negative retinal hemorrhage ENT: Negative Runny nose, negative ear pain, negative tinnitus, negative  gingival bleeding Neck:  Negative scars, dressing over left neck area, negative lymphadenopathy, JVD Lungs: clear to auscultation bilateral. Left  chest tube removed area covered and clean negative air leak Cardiovascular: Regular rate and rhythm without murmur gallop or rub normal S1 and S2 Abdomen:negative abdominal pain, negative dysphagia, Nontender, nondistended, soft, bowel sounds positive, no rebound, no ascites, no appreciable mass Extremities: No significant cyanosis, clubbing, or edema bilateral lower extremities Psychiatric:  Negative depression, negative anxiety, negative fatigue, negative mania  Neurologic:  Cranial nerves II through XII intact, tongue/uvula midline, all extremities muscle strength 5/5, sensation intact throughout, negative dysarthria, negative expressive aphasia, negative receptive aphasia.      Data Reviewed: Basic Metabolic Panel:  Recent Labs Lab 12/16/14 0620 12/18/14 0500 12/19/14 0440 12/19/14 1232 12/20/14 0433 12/21/14 0235  NA 136 135 137  --  138 136  K 4.0 2.9* 2.8* 3.2* 3.2* 3.1*  CL 100* 95* 93*  --  95* 99*  CO2 30 32 35*  --  35* 30  GLUCOSE 122* 99 101*  --  102* 95  BUN 11 7 <5*  --  <5* <5*  CREATININE 0.74 0.62 0.65  --  0.67 0.58  CALCIUM 7.7* 7.7* 8.1*  --  8.1* 7.6*  MG  --   --   --  1.5*  --   --    Liver Function Tests:  Recent Labs Lab 12/16/14 0620  AST 18  ALT 18  ALKPHOS 60  BILITOT 0.9  PROT 5.0*  ALBUMIN 2.4*   No results for input(s): LIPASE, AMYLASE in the last 168 hours. No results for input(s): AMMONIA in the last 168 hours. CBC:  Recent Labs Lab 12/15/14 0420 12/16/14 0620 12/19/14 0440 12/20/14 0433  WBC 7.7 8.0 6.3 6.4  HGB 10.3* 10.6* 10.4* 10.4*  HCT 31.9* 33.4* 32.0* 32.8*  MCV 93.0 94.6 92.0 93.2  PLT 232 217 224 222   Cardiac Enzymes: No results for input(s): CKTOTAL, CKMB, CKMBINDEX, TROPONINI in the last 168 hours. BNP (last 3 results)  Recent Labs  09/10/14 1740 12/08/14 1758 12/13/14 1703  BNP 80.9 29.3 21.1    ProBNP (last 3 results) No results for input(s): PROBNP in the last 8760 hours.  CBG: No results for  input(s): GLUCAP in the last 168 hours.  Recent Results (from the past 240 hour(s))  MRSA PCR Screening     Status: None   Collection Time: 12/13/14  4:20 PM  Result Value Ref Range Status   MRSA by PCR NEGATIVE NEGATIVE Final    Comment:        The GeneXpert MRSA Assay (FDA approved for NASAL specimens only), is one component of a comprehensive MRSA colonization surveillance program. It is not intended to diagnose MRSA infection nor to guide or monitor treatment for MRSA infections.   Culture, body fluid-bottle     Status: None   Collection Time: 12/14/14  8:01 AM  Result Value Ref Range Status   Specimen Description FLUID PERICARDIAL  Final   Special Requests   Final    BOTTLES DRAWN AEROBIC AND ANAEROBIC 10CC PATIENT ON FOLLOWING ZINACEF   Culture NO GROWTH 5 DAYS  Final   Report Status 12/19/2014 FINAL  Final  Gram stain     Status: None   Collection Time: 12/14/14  8:01 AM  Result Value Ref Range Status   Specimen Description FLUID PERICARDIAL  Final   Special Requests PATIENT ON FOLLOWING ZINACEF  Final   Gram Stain   Final  MODERATE WBC PRESENT, PREDOMINANTLY MONONUCLEAR NO ORGANISMS SEEN    Report Status 12/14/2014 FINAL  Final  Culture, body fluid-bottle     Status: None   Collection Time: 12/14/14  8:05 AM  Result Value Ref Range Status   Specimen Description FLUID LEFT PLEURAL  Final   Special Requests PATIENT ON FOLLOWING ZINACEF  Final   Culture NO GROWTH 5 DAYS  Final   Report Status 12/19/2014 FINAL  Final  Gram stain     Status: None   Collection Time: 12/14/14  8:05 AM  Result Value Ref Range Status   Specimen Description FLUID LEFT PLEURAL  Final   Special Requests PATIENT ON FOLLOWING ZINACEF  Final   Gram Stain   Final    MODERATE WBC PRESENT,BOTH PMN AND MONONUCLEAR NO ORGANISMS SEEN    Report Status 12/14/2014 FINAL  Final     Studies:  Recent x-ray studies have been reviewed in detail by the Attending Physician  Scheduled  Meds:  Scheduled Meds: . anastrozole  1 mg Oral Daily  . B-complex with vitamin C  1 tablet Oral Daily  . bisacodyl  10 mg Oral Daily  . cholecalciferol  1,000 Units Oral Daily  . furosemide  20 mg Oral Daily  . nystatin cream   Topical TID  . polyethylene glycol  17 g Oral Daily  . potassium chloride  40 mEq Oral Daily  . senna-docusate  1 tablet Oral BID  . spironolactone  12.5 mg Oral Daily  . tobramycin-dexamethasone  1 drop Left Eye Q4H while awake    Time spent on care of this patient: 40 mins   Raymont Andreoni, Geraldo Docker , MD  [facility] Office  [phone] Pager - [phone]  On-Call/Text Page:      [email or web address]      password [email or web address]  If 7PM-7AM, please contact night-coverage [email or web address] Password [email or web address] 12/21/2014, 7:07 PM   LOS: 8 days   Care during the described time interval was provided by me .  I have reviewed this patient's available data, including medical history, events of note, physical examination, and all test results as part of my evaluation. I have personally reviewed and interpreted all radiology studies.   Dia Crawford, MD [phone] Pager

## 2014-12-22 ENCOUNTER — Inpatient Hospital Stay (HOSPITAL_COMMUNITY): Payer: Medicare Other

## 2014-12-22 ENCOUNTER — Telehealth: Payer: Self-pay | Admitting: *Deleted

## 2014-12-22 DIAGNOSIS — D0590 Unspecified type of carcinoma in situ of unspecified breast: Secondary | ICD-10-CM

## 2014-12-22 LAB — MAGNESIUM: Magnesium: 1.9 mg/dL (ref 1.7–2.4)

## 2014-12-22 LAB — BASIC METABOLIC PANEL
Anion gap: 6 (ref 5–15)
CO2: 30 mmol/L (ref 22–32)
CREATININE: 0.67 mg/dL (ref 0.44–1.00)
Calcium: 8.2 mg/dL — ABNORMAL LOW (ref 8.9–10.3)
Chloride: 100 mmol/L — ABNORMAL LOW (ref 101–111)
GFR calc non Af Amer: >60 mL/min (ref >60)
Glucose, Bld: 100 mg/dL — ABNORMAL HIGH (ref 65–99)
Potassium: 3.2 mmol/L — ABNORMAL LOW (ref 3.5–5.1)
Sodium: 136 mmol/L (ref 135–145)

## 2014-12-22 MED ORDER — SPIRONOLACTONE 25 MG PO TABS
25.0000 mg | ORAL_TABLET | Freq: Every day | ORAL | Status: DC
  Administered 2014-12-22: 25 mg via ORAL
  Filled 2014-12-22: qty 1

## 2014-12-22 MED ORDER — MAGNESIUM OXIDE 400 (241.3 MG) MG PO TABS: 400.0000 mg | ORAL_TABLET | Freq: Every day | ORAL | Status: DC

## 2014-12-22 MED ORDER — NYSTATIN 100000 UNIT/GM EX CREA: TOPICAL_CREAM | Freq: Three times a day (TID) | CUTANEOUS | Status: DC

## 2014-12-22 MED ORDER — POTASSIUM CHLORIDE CRYS ER 20 MEQ PO TBCR
60.0000 meq | EXTENDED_RELEASE_TABLET | Freq: Once | ORAL | Status: AC
  Administered 2014-12-22: 60 meq via ORAL
  Filled 2014-12-22: qty 3

## 2014-12-22 MED ORDER — SPIRONOLACTONE 25 MG PO TABS: 25.0000 mg | ORAL_TABLET | Freq: Every day | ORAL | Status: DC

## 2014-12-22 MED ORDER — HEPARIN SOD (PORK) LOCK FLUSH 100 UNIT/ML IV SOLN
500.0000 [IU] | INTRAVENOUS | Status: AC | PRN
  Administered 2014-12-22: 500 [IU]

## 2014-12-22 MED ORDER — POTASSIUM CHLORIDE CRYS ER 20 MEQ PO TBCR: 40.0000 meq | EXTENDED_RELEASE_TABLET | Freq: Every day | ORAL | Status: DC

## 2014-12-22 MED ORDER — FUROSEMIDE 20 MG PO TABS: 20.0000 mg | ORAL_TABLET | ORAL | Status: DC

## 2014-12-22 MED ORDER — ZOLPIDEM TARTRATE 5 MG PO TABS: 5.0000 mg | ORAL_TABLET | Freq: Every evening | ORAL | Status: DC | PRN

## 2014-12-22 MED ORDER — TRAMADOL HCL 50 MG PO TABS: 50.0000 mg | ORAL_TABLET | Freq: Four times a day (QID) | ORAL | Status: DC | PRN

## 2014-12-22 MED ORDER — ALPRAZOLAM 0.25 MG PO TABS: 0.2500 mg | ORAL_TABLET | Freq: Two times a day (BID) | ORAL | Status: DC | PRN

## 2014-12-22 MED ORDER — MAGNESIUM OXIDE 400 (241.3 MG) MG PO TABS
400.0000 mg | ORAL_TABLET | Freq: Every day | ORAL | Status: DC
  Administered 2014-12-22: 400 mg via ORAL
  Filled 2014-12-22: qty 1

## 2014-12-22 MED ORDER — MAGNESIUM SULFATE 2 GM/50ML IV SOLN
2.0000 g | Freq: Once | INTRAVENOUS | Status: AC
  Filled 2014-12-22: qty 50

## 2014-12-22 MED ORDER — OXYCODONE HCL 5 MG PO TABS: 5.0000 mg | ORAL_TABLET | ORAL | Status: DC | PRN

## 2014-12-22 NOTE — Telephone Encounter (Signed)
TC from [facility] - Will Dr. Jana Hakim be the attending physician for this patient for Hospice and is Dr. Jana Hakim ok with hospice MD providing symptom management? Pleas return call to [phone]

## 2014-12-22 NOTE — Progress Notes (Signed)
Patient ID: Linda [name], female   DOB: 17-Nov-1945, 69 y.o.   MRN: [id number] Advanced Heart Failure Rounding Note   Subjective:    Yesterday CT removed. 12.5 mg spiro added and she continued on lasix 20 mg daily. Renal function ok.    Denies SOB.    Objective:   Weight Range:  Vital Signs:   Temp:  [98 F (36.7 C)-98.5 F (36.9 C)] 98.2 F (36.8 C) (07/08 0527) Pulse Rate:  [85-106] 92 (07/08 0527) Resp:  [16-25] 18 (07/08 0527) BP: (107-133)/(46-70) 132/68 mmHg (07/08 0527) SpO2:  [94 %-98 %] 96 % (07/08 0527) Weight:  [228 lb 4.8 oz (103.556 kg)-228 lb 6.4 oz (103.602 kg)] 228 lb 6.4 oz (103.602 kg) (07/08 0527) Last BM Date: 12/21/14  Weight change: Filed Weights   12/21/14 0500 12/22/14 0500 12/22/14 0527  Weight: 227 lb 15.3 oz (103.4 kg) 228 lb 4.8 oz (103.556 kg) 228 lb 6.4 oz (103.602 kg)    Intake/Output:   Intake/Output Summary (Last 24 hours) at 12/22/14 0844 Last data filed at 12/22/14 0527  Gross per 24 hour  Intake    360 ml  Output      0 ml  Net    360 ml     Physical Exam: General:  Alopecic. Ill appearing. Walking in room.  HEENT: normal Neck: supple. JVP 5-6 cm. Carotids 2+ bilat; no bruits. No lymphadenopathy or thryomegaly appreciated. Cor: PMI nonpalpable. Regular rate & rhythm. No rubs, gallops or murmurs. Lungs: decreased BS at bases bilaterally.  Abdomen: soft, nontender, +distended. No hepatosplenomegaly. No bruits or masses. Good bowel sounds. Extremities: no cyanosis, clubbing, rash.  No edema.   Neuro: alert & orientedx3, cranial nerves grossly intact. moves all 4 extremities w/o difficulty. Affect pleasant  Telemetry: SR 90s  Labs: Basic Metabolic Panel:  Recent Labs Lab 12/18/14 0500 12/19/14 0440 12/19/14 1232 12/20/14 0433 12/21/14 0235 12/22/14 0446  NA 135 137  --  138 136 136  K 2.9* 2.8* 3.2* 3.2* 3.1* 3.2*  CL 95* 93*  --  95* 99* 100*  CO2 32 35*  --  35* 30 30  GLUCOSE 99 101*  --  102* 95 100*  BUN 7  <5*  --  <5* <5* <5*  CREATININE 0.62 0.65  --  0.67 0.58 0.67  CALCIUM 7.7* 8.1*  --  8.1* 7.6* 8.2*  MG  --   --  1.5*  --   --  1.9    Liver Function Tests:  Recent Labs Lab 12/16/14 0620  AST 18  ALT 18  ALKPHOS 60  BILITOT 0.9  PROT 5.0*  ALBUMIN 2.4*   No results for input(s): LIPASE, AMYLASE in the last 168 hours. No results for input(s): AMMONIA in the last 168 hours.  CBC:  Recent Labs Lab 12/16/14 0620 12/19/14 0440 12/20/14 0433  WBC 8.0 6.3 6.4  HGB 10.6* 10.4* 10.4*  HCT 33.4* 32.0* 32.8*  MCV 94.6 92.0 93.2  PLT 217 224 222    Cardiac Enzymes: No results for input(s): CKTOTAL, CKMB, CKMBINDEX, TROPONINI in the last 168 hours.  BNP: BNP (last 3 results)  Recent Labs  09/10/14 1740 12/08/14 1758 12/13/14 1703  BNP 80.9 29.3 21.1    ProBNP (last 3 results) No results for input(s): PROBNP in the last 8760 hours.    Other results:  Imaging: Dg Chest 2 View  12/22/2014   CLINICAL DATA:  Status post removal of the left-sided chest tube yesterday  EXAM: CHEST  2  VIEW  COMPARISON:  Portable chest x-ray of December 21, 2014  FINDINGS: The lungs are mildly hyperinflated with hemidiaphragm flattening. There is no definite pneumothorax on the left. There small amount of pleural fluid blunting the costophrenic angles bilaterally. There is density corresponding to the previous chest tube track on the left. There is biapical pleural thickening. The cardiac silhouette is top normal in size. The pulmonary vascularity is not engorged. Port-A-Cath appliance tip projects over the distal third of the SVC. The mediastinum is normal in width. The bony thorax exhibits no acute abnormality. Patient has undergone previous left axillary lymph node dissection.  IMPRESSION: COPD. No recurrent left-sided pneumothorax. Small bilateral pleural effusions.   Electronically Signed   By: David  Martinique M.D.   On: 12/22/2014 07:55   Dg Chest Port 1 View  12/21/2014   CLINICAL DATA:   Chest tube removal.  EXAM: PORTABLE CHEST - 1 VIEW  COMPARISON:  12/21/2014  FINDINGS: Left chest tube removed.  Negative for pneumothorax.  Port-A-Cath tip at the cavoatrial junction unchanged.  Bibasilar atelectasis with interval improvement in aeration in the bases since earlier today.  IMPRESSION: Left chest tube removal.  No pneumothorax  Improved aeration in the lung bases.   Electronically Signed   By: Franchot Gallo M.D.   On: 12/21/2014 13:55   Dg Chest Port 1 View  12/21/2014   CLINICAL DATA:  Sub xiphoid pericardial window creation, chest tube check  EXAM: PORTABLE CHEST - 1 VIEW  COMPARISON:  Portable chest x-ray of December 20, 2014  FINDINGS: The left upper chest tube is unchanged in position. There is no pneumothorax. No significant pleural effusion on the left is observed. On the right there is small amount of pleural fluid as well as basilar atelectasis which is stable. The cardiac silhouette is mildly enlarged but stable. The pulmonary vascularity is less engorged today. The power port appliance tip projects over the distal third of the SVC.  IMPRESSION: 1. Stable appearance of the left chest tube. There is no pneumothorax or significant left pleural effusion. 2. Persistent right lower lobe atelectasis or infiltrate and small right pleural effusion. 3. Decreased pulmonary vascular congestion.   Electronically Signed   By: David  Martinique M.D.   On: 12/21/2014 08:01     Medications:     Scheduled Medications: . anastrozole  1 mg Oral Daily  . B-complex with vitamin C  1 tablet Oral Daily  . bisacodyl  10 mg Oral Daily  . cholecalciferol  1,000 Units Oral Daily  . furosemide  20 mg Oral Daily  . magnesium sulfate 1 - 4 g bolus IVPB  2 g Intravenous Once  . nystatin cream   Topical TID  . polyethylene glycol  17 g Oral Daily  . potassium chloride  40 mEq Oral Daily  . potassium chloride  60 mEq Oral Once  . senna-docusate  1 tablet Oral BID  . spironolactone  12.5 mg Oral Daily  .  tobramycin-dexamethasone  1 drop Left Eye Q4H while awake    Infusions: . sodium chloride 10 mL/hr at 12/20/14 2240    PRN Medications: ALPRAZolam, benzonatate, fentaNYL (SUBLIMAZE) injection, HYDROcodone-homatropine, lidocaine-prilocaine, nitroGLYCERIN, ondansetron (ZOFRAN) IV, oxyCODONE, traMADol, zolpidem   Assessment:   1.  Large hemorrhagic pericardial effusion with early tamponade: suspect malignant.     -s/p window 6/30 2. Large pleural effusion: suspect malignant.    --s/p chest tube 6/30 3. Metastatic/recurrent breast CA 4. Acute diastolic HF with anasarca: EF 60-65% by echo.  5. Acute respiratory failure 6. Small RUL/RLL PE: Decided against IVC filter given plan for palliative approach.  7. DNR/DNI 8. Hypokalemia   9. Hypomagesium Plan/Discussion:    Volume status stable. Weight unchanged. Overall weight down 41 pounds. Change lasix to 20 mg every other day.  Increase spiro to 25 mg daily. Supplement K. Pericardial drain out 7/6. Last pleural tube removed 7/7. CVTS following.   Plan for home today with hospice care given metastatic breast cancer.   Will need BMET next week. Follow up in HF clinic in the next few weeks.   CLEGG,AMY, NP-C  Length of Stay: 9  Advanced Heart Failure Team Pager [phone] (M-F; 7a - 4p)  Please contact [facility] for night-coverage after hours (4p -7a ) and weekends on [email or web address]  Agree with above, no changes.  Will arrange CHF clinic followup.   Loralie Champagne 12/22/2014 11:45 AM

## 2014-12-22 NOTE — Telephone Encounter (Signed)
TC from intake dept @ [facility]. Pt is currently in the hospital but ready for discharge. Will pt be palliative care of will she need full hospice care at time of discharge? Return call # is [phone]

## 2014-12-22 NOTE — Telephone Encounter (Signed)
Juliann Pulse RN with Hospice called.  Patient has been discharged.  Verbal order received and read back from Dr. Candace Gallus that he will be glad to be te attending provider and Hospice may have Symptom management orders.  Orders given to [facility] with this call.

## 2014-12-22 NOTE — Progress Notes (Signed)
Dr. Lorre Nick notified to ask if pt can transfer because there is a need for a stepdown bed. New orders received to transfer patient to telemetry.

## 2014-12-22 NOTE — Discharge Summary (Addendum)
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 -Patient stable rate for discharge  Acute pulmonary emboli -given hemorrhagic pericardial effusion, she is not a candidate for anticoagulation  - no IVC filter as pt to pursue palliative care   Hypertension -Currently stable  Recurrent widely metastatic breast cancer - Oncology recommended to continue anastrozole 1 mg daily -No further addition of chemotherapy or brain MRI - now to pursue palliative care  -She will be under the care of [facility]  -7/8 patient states she is a remaining contact with hospice and palliative care [place] who plan to contact her to set up appointments on Monday.  Anasarca - acute diastolic CHF  -Strict in and out -16.4 L  -Continue Lasix 20 mg daily - CHF Team following   Hypokalemia -Potassium goal > 4  -K Dur 60 mEq 1 prior to discharge -Request that Dr.Gustav C Magrinat (oncology) check electrolyte levels at her appointment on 7/12 at 0945,  Hypomagnesemia -Magnesium goal>2 -Magnesium IV 2 g 1 prior to discharge -Request that Dr.Gustav C Magrinat (oncology) check electrolyte levels at her appointment on 7/12  at 0945,     Discharge Diagnoses:  Principal Problem:   Pericardial effusion with cardiac tamponade Active Problems:   Cough   Hypertension   Malignant pleural effusion   Bilateral breast cancer   Brain metastases   Anasarca   Left upper extremity swelling   Peripheral edema   Pericardial tamponade   Acute pulmonary embolism   Acute on chronic diastolic CHF (congestive heart failure)   Chest tube in place   Pericardial effusion   Hypokalemia   Hypomagnesemia   Hemorrhagic pericardial effusion   Essential hypertension   Breast cancer metastasized to lung   Acute diastolic CHF (congestive heart failure)   Breast cancer in situ   Discharge Condition: Stable  Diet recommendation: Regular  Filed Weights   12/21/14 0500 12/22/14 0500 12/22/14 0527  Weight: 103.4 kg (227 lb 15.3 oz) 103.556 kg (228 lb 4.8 oz) 103.602 kg (228 lb 6.4 oz)    History of present illness:  69 year old WF PMHx metastatic breast cancer status post bilateral mastectomies in 2013, GERD, and hypertension who presented for a scheduled follow-up echocardiogram and was found to have a large pericardial effusion. Patient did endorse orthopnea, dyspnea, and significant weight gain over a month. She also reported chest pain on coughing, generalized weakness and fatigue.   Patient was admitted by the Cardiology service on 6/29 and TCTS was consulted. Patient underwent pericardial window on 6/30 w/ 800 mL of hemorrhagic fluid drained. CT angiogram of the chest on 6/29 showed pulmonary embolism in the right lung and metastatic disease.  Vascular Surgery was consulted regarding anticoagulation and IVC filter. Oncology was consulted and after an extended discussion with the patient she decided to pursue palliative  care.  During his hospitalization patient was diagnosed with a left sided malignant pleural effusion which showed malignant cells present consistent with breast carcinoma. In addition patient had a  pericardial window placed to drain hemorrhagic pericardial effusion. Unfortunately with the positive pathology report for malignant cells in the pleural effusion, patient understands this is a poor prognostic indicator. Patient has chosen to be discharged under the care of [facility]. Patient will follow-up with Dr.Gustav C Magrinat (oncology) on 7/17, to discuss any possible additional treatment options (oncology notes not encouraging).    Consultants: Dr.Clarence Keturah Barre (TCTS) Dr.Gustav C Magrinat (oncology) Dr.Daniel R Bensimhon (cardiology)    Procedure/Significant Events: 6/30 left chest tube placed drained 400 serous fluid; pathology shows MALIGNANT CELLS PRESENT, CONSISTENT WITH BREAST CARCINOMA      Discharge Exam: Filed Vitals:   12/22/14 0041 12/22/14 0357 12/22/14 0500 12/22/14 0527  BP: 117/46 133/59  132/68  Pulse: 86 90  92  Temp: 98.4 F (36.9 C) 98.1 F (36.7 C)  98.2 F (36.8 C)  TempSrc: Oral Oral  Oral  Resp: 19 16  18   Height:    5\' 9"  (1.753 m)  Weight:   103.556 kg (228 lb 4.8 oz) 103.602 kg (228 lb 6.4 oz)  SpO2: 94% 94%  96%    General: A/O 4, NAD, No acute respiratory distress Eyes: Negative headache, eye pain, double vision, scotomas, floaters, negative retinal hemorrhage ENT: Negative Runny nose, negative ear pain, negative tinnitus, negative gingival bleeding Neck: Negative scars, dressing over left neck area, negative lymphadenopathy, JVD Lungs: clear to auscultation bilateral. Left chest tube removed area covered and clean negative air leak Cardiovascular: Regular rate and rhythm without murmur gallop or rub normal S1 and S2 Abdomen:negative abdominal pain, negative dysphagia, Nontender, nondistended, soft, bowel sounds positive, no rebound, no ascites, no appreciable mass Extremities: No significant cyanosis, clubbing, or edema bilateral lower extremities Psychiatric: Negative depression, negative anxiety,  negative fatigue, negative mania  Neurologic: Cranial nerves II through XII intact, tongue/uvula midline, all extremities muscle strength 5/5, sensation intact throughout, negative dysarthria, negative expressive aphasia, negative receptive aphasia.   Discharge Instructions     Medication List    STOP taking these medications        calcium-vitamin D 500-200 MG-UNIT per tablet  Commonly known as:  OSCAL WITH D      TAKE these medications        ALPRAZolam 0.25 MG tablet  Commonly known as:  XANAX  Take 1 tablet (0.25 mg total) by mouth 2 (two) times daily as needed for anxiety.     anastrozole 1 MG tablet  Commonly known as:  ARIMIDEX  TAKE 1 TABLET BY MOUTH DAILY.     b complex vitamins tablet  Take 1 tablet by mouth daily.     benzonatate 100 MG capsule  Commonly known as:  TESSALON  Take 1 capsule (100 mg total) by mouth 3 (three) times daily as needed for cough.     cholecalciferol 1000 UNITS tablet  Commonly known as:  VITAMIN D  Take 1 tablet (1,000 Units total) by mouth daily.     furosemide 20 MG tablet  Commonly known as:  LASIX  Take 1 tablet (20 mg total) by mouth every other day.  Start taking on:  12/23/2014     HYDROcodone-homatropine 5-1.5 MG/5ML syrup  Commonly known as:  HYCODAN  Take 5 mLs by mouth every 6 (six) hours as needed for cough.  lidocaine-prilocaine cream  Commonly known as:  EMLA  Apply 1 application topically as needed.     magnesium oxide 400 (241.3 MG) MG tablet  Commonly known as:  MAG-OX  Take 1 tablet (400 mg total) by mouth daily.     multivitamins with iron Tabs tablet  Take 1 tablet by mouth daily.     nystatin cream  Commonly known as:  MYCOSTATIN  Apply topically 3 (three) times daily.     oxyCODONE 5 MG immediate release tablet  Commonly known as:  Oxy IR/ROXICODONE  Take 1-2 tablets (5-10 mg total) by mouth every 4 (four) hours as needed for severe pain.     potassium chloride SA 20 MEQ tablet  Commonly  known as:  K-DUR,KLOR-CON  Take 2 tablets (40 mEq total) by mouth daily.     spironolactone 25 MG tablet  Commonly known as:  ALDACTONE  Take 1 tablet (25 mg total) by mouth daily.     tobramycin-dexamethasone ophthalmic solution  Commonly known as:  TOBRADEX  Place 1 drop into the left eye every 4 (four) hours while awake.     traMADol 50 MG tablet  Commonly known as:  ULTRAM  Take 1-2 tablets (50-100 mg total) by mouth every 6 (six) hours as needed (mild pain).     zolpidem 5 MG tablet  Commonly known as:  AMBIEN  Take 1 tablet (5 mg total) by mouth at bedtime as needed for sleep.       Allergies  Allergen Reactions  . Ace Inhibitors Cough   Follow-up Information    Follow up with Rexene Alberts, MD On 01/01/2015.   Specialty:  Cardiothoracic Surgery   Why:  PA/LAT CXR to be taken (at [facility] which is in the same building as Dr. Guy Sandifer office) on 01/01/2015 at 12:45 pm ;Appointment time is at 1:30 pm   Contact information:   [place] [address] [phone]       Follow up with Chauncey Cruel, MD. Go on 12/26/2014.   Specialty:  Oncology   Why:  Patient has follow-up appointment on 7/12 at [facility] information:   [place] [id number] [phone]        The results of significant diagnostics from this hospitalization (including imaging, microbiology, ancillary and laboratory) are listed below for reference.    Significant Diagnostic Studies: Dg Chest 1 View  12/13/2014   CLINICAL DATA:  69 year old female with history of breast cancer and shortness of breath.  EXAM: CHEST  1 VIEW  COMPARISON:  Chest radiograph dated 12/09/2014.  FINDINGS: Right IJ central Port-A-Cath with tip at atrial caval junction in stable positioning. There has been interval increase in the left lower lung field opacity likely combination of pleural effusion with associated compressive atelectasis of the adjacent lung.  Superimposed pneumonia is not excluded. Small right pleural effusion. No pneumothorax. Stable cardiac silhouette. Grossly unremarkable osseous structures. Left axillary surgical clips.  IMPRESSION: Interval increase in the left pleural effusion with consolidative changes of the adjacent lung likely atelectasis/ pneumonia. Clinical correlation and follow-up recommended.   Electronically Signed   By: Anner Crete M.D.   On: 12/13/2014 17:36   Dg Chest 1 View  12/09/2014   CLINICAL DATA:  Pleural effusion.  Post left-sided thoracentesis.  EXAM: CHEST  1 VIEW  COMPARISON:  Chest radiograph- 12/08/2014  FINDINGS: Interval reduction in persistent small left-sided effusion post thoracentesis. No pneumothorax.  Grossly unchanged enlarged  cardiac silhouette and mediastinal contours given persistently reduced lung volumes. Stable position of support apparatus.  Improved aeration of the left lung base with persistent bibasilar opacities, left greater than right. No new focal airspace opacities.  Surgical clips overlie the left axilla. Osseous structures appear normal.  IMPRESSION: 1. Interval reduction in persistent small left-sided effusion post thoracentesis. No pneumothorax. 2. Improved aeration the left lung base with residual bibasilar opacities, left greater than right, likely atelectasis.   Electronically Signed   By: Sandi Mariscal M.D.   On: 12/09/2014 11:39   Dg Chest 2 View  12/22/2014   CLINICAL DATA:  Status post removal of the left-sided chest tube yesterday  EXAM: CHEST  2 VIEW  COMPARISON:  Portable chest x-ray of December 21, 2014  FINDINGS: The lungs are mildly hyperinflated with hemidiaphragm flattening. There is no definite pneumothorax on the left. There small amount of pleural fluid blunting the costophrenic angles bilaterally. There is density corresponding to the previous chest tube track on the left. There is biapical pleural thickening. The cardiac silhouette is top normal in size. The pulmonary  vascularity is not engorged. Port-A-Cath appliance tip projects over the distal third of the SVC. The mediastinum is normal in width. The bony thorax exhibits no acute abnormality. Patient has undergone previous left axillary lymph node dissection.  IMPRESSION: COPD. No recurrent left-sided pneumothorax. Small bilateral pleural effusions.   Electronically Signed   By: David  Martinique M.D.   On: 12/22/2014 07:55   Dg Chest 2 View  11/29/2014   CLINICAL DATA:  Breast cancer  EXAM: CHEST  2 VIEW  COMPARISON:  09/11/2014  FINDINGS: Small left pleural effusion shows mild interval improvement. Mild left lower lobe atelectasis also improved. Minimal pleural effusion on the right.  Cardiac enlargement. Pericardial effusion noted on prior CT. Negative for heart failure or edema. Port-A-Cath tip in the lower SVC.  IMPRESSION: Mild improvement in left pleural effusion and left lower lobe atelectasis. Minimal right pleural effusion.  Cardiac enlargement consistent with pericardial effusion.   Electronically Signed   By: Franchot Gallo M.D.   On: 11/29/2014 15:58   Ct Angio Chest Pe W/cm &/or Wo Cm  12/13/2014   CLINICAL DATA:  Shortness of breath and coughing. Worsening swelling of left arm. Breast cancer diagnosed 2/13 with right mastectomy and left modified radical mastectomy. Radiation therapy. Last chemotherapy 4/16.  EXAM: CT ANGIOGRAPHY CHEST WITH CONTRAST  TECHNIQUE: Multidetector CT imaging of the chest was performed using the standard protocol during bolus administration of intravenous contrast. Multiplanar CT image reconstructions and MIPs were obtained to evaluate the vascular anatomy.  CONTRAST:  57mL OMNIPAQUE IOHEXOL 350 MG/ML SOLN  COMPARISON:  Plain film 12/13/2014.  Chest CT of 09/11/2014  FINDINGS: Mediastinum/Nodes: The quality of this exam for evaluation of pulmonary embolism is sufficient. Relatively small volume but definite pulmonary emboli. Examples within a right upper lobe segmental branch in image  76 of series 6 and a right lower lobe subsegmental branch on image 157 of series 6.  No supraclavicular adenopathy. A right Port-A-Cath which terminates at the mid to high at right atrium. Normal heart size. Moderate to large pericardial effusion, increased. Multivessel coronary artery atherosclerosis. Lad coronary artery atherosclerosis on image 44. Elevated right heart pressures, with reflux of contrast into the IVC and hepatic veins.  Resolution of previously described mediastinal adenopathy. No hilar adenopathy. No internal mammary adenopathy. Left axillary node dissection, without axillary adenopathy. Bilateral mastectomy.  Lungs/Pleura: Moderate left and small right pleural effusions,  both enlarged since the prior CT. Motion and patient body habitus degradation. Right base subsegmental atelectasis.  Subpleural radiation fibrosis within the left upper lobe. Collapse/consolidative change in the left lower lobe. Improved since 09/11/2014.  Upper abdomen: Normal imaged portions of the liver, spleen, stomach, adrenal glands. Trace perihepatic ascites.  Musculoskeletal: New heterogeneous sclerosis, consistent with osseous metastasis.  Review of the MIP images confirms the above findings.  IMPRESSION: 1. Small volume pulmonary emboli. These results were called by telephone at the time of interpretation on 12/13/2014 at 6:09 pm to covering nurse, Melvenia Needles, who verbally acknowledged these results. 2. Enlargement of bilateral pleural effusions and a now large pericardial effusion. suggestion of elevated right heart pressures. 3.  Atherosclerosis, including within the coronary arteries. 4. Left base aeration which is improved. Residual collapse/consolidative changes could represent atelectasis and/or infection. 5. Resolution of mediastinal and hilar adenopathy. 6. Development of osseous metastasis. 7. Trace perihepatic ascites.   Electronically Signed   By: Abigail Miyamoto M.D.   On: 12/13/2014 18:13   Dg Chest Port 1  View  12/21/2014   CLINICAL DATA:  Chest tube removal.  EXAM: PORTABLE CHEST - 1 VIEW  COMPARISON:  12/21/2014  FINDINGS: Left chest tube removed.  Negative for pneumothorax.  Port-A-Cath tip at the cavoatrial junction unchanged.  Bibasilar atelectasis with interval improvement in aeration in the bases since earlier today.  IMPRESSION: Left chest tube removal.  No pneumothorax  Improved aeration in the lung bases.   Electronically Signed   By: Franchot Gallo M.D.   On: 12/21/2014 13:55   Dg Chest Port 1 View  12/21/2014   CLINICAL DATA:  Sub xiphoid pericardial window creation, chest tube check  EXAM: PORTABLE CHEST - 1 VIEW  COMPARISON:  Portable chest x-ray of December 20, 2014  FINDINGS: The left upper chest tube is unchanged in position. There is no pneumothorax. No significant pleural effusion on the left is observed. On the right there is small amount of pleural fluid as well as basilar atelectasis which is stable. The cardiac silhouette is mildly enlarged but stable. The pulmonary vascularity is less engorged today. The power port appliance tip projects over the distal third of the SVC.  IMPRESSION: 1. Stable appearance of the left chest tube. There is no pneumothorax or significant left pleural effusion. 2. Persistent right lower lobe atelectasis or infiltrate and small right pleural effusion. 3. Decreased pulmonary vascular congestion.   Electronically Signed   By: David  Martinique M.D.   On: 12/21/2014 08:01   Dg Chest Port 1 View  12/20/2014   CLINICAL DATA:  Pleural effusion, chest tube treatment  EXAM: PORTABLE CHEST - 1 VIEW  COMPARISON:  Portable chest x-ray of December 18, 2014  FINDINGS: The lower left chest tube appears to been removed. The upper chest tube is stable with its tip projecting over the posterior aspect of the fourth rib. No significant pleural effusion is observed currently. There is persistent left lower lobe atelectasis or pneumonia. On the right the lung is adequately inflated. There is  basilar atelectasis and small pleural effusion, unchanged. The cardiac silhouette remains enlarged. The pulmonary vascularity is engorged. The power port appliance tip projects over the distal third of the SVC. The observed bony thorax is unremarkable.  IMPRESSION: 1. Interval removal of the lower left chest tube. The upper chest tube is unchanged. There is no significant left pleural effusion and no left pneumothorax. 2. Persistent bibasilar atelectasis or infiltrate greater on the left than on the right.  A small right pleural effusion is suspected. 3. Mild cardiac enlargement with pulmonary vascular congestion consistent with CHF.   Electronically Signed   By: David  Martinique M.D.   On: 12/20/2014 07:39   Dg Chest Port 1 View  12/18/2014   CLINICAL DATA:  Chest tube.  EXAM: PORTABLE CHEST - 1 VIEW  COMPARISON:  12/17/2014  FINDINGS: Right Port-A-Cath and left chest tube remain in place, unchanged. No pneumothorax. There is cardiomegaly. Vascular congestion. Perihilar and bibasilar atelectasis or infiltrates. Small bilateral effusions.  IMPRESSION: Cardiomegaly with vascular congestion.  Low lung volumes with bibasilar atelectasis and small effusions.  No pneumothorax.   Electronically Signed   By: Rolm Baptise M.D.   On: 12/18/2014 08:40   Dg Chest Port 1 View  12/17/2014   CLINICAL DATA:  Chest tube.  Fluid on the lungs.  Lobectomy.  EXAM: PORTABLE CHEST - 1 VIEW  COMPARISON:  12/16/2014.  FINDINGS: Cardiopericardial silhouette is enlarged. Interval removal of the LEFT IJ central line. RIGHT IJ Port-A-Cath unchanged.  LEFT thoracostomy tube unchanged. Drain overlying the LEFT upper quadrant also appears unchanged. Bilateral basilar opacity, likely representing atelectasis appears similar to yesterday's exam.  LEFT axillary dissection clips noted.  There is no pneumothorax.  IMPRESSION: 1. Interval removal of LEFT IJ central line. Other support apparatus unchanged. 2. No interval change in the heart or lungs.    Electronically Signed   By: Dereck Ligas M.D.   On: 12/17/2014 09:34   Dg Chest Port 1 View  12/16/2014   CLINICAL DATA:  Current history of metastatic breast cancer with malignant left pleural effusion and pericardial effusion. Indwelling left chest tube. Acute pulmonary emboli. Followup bilateral effusions and atelectasis in the lower lobes.  EXAM: PORTABLE CHEST - 1 VIEW  COMPARISON:  12/15/2014 and earlier.  FINDINGS: Cardiac silhouette moderately enlarged, unchanged. Left chest tube in place with no pneumothorax. Right jugular Port-A-Cath tip projects at the expected location the cavoatrial junction. Left jugular central venous catheter tip projects at the junction of the innominate vein and SVC, unchanged. Small bilateral pleural effusions, slightly improved since yesterday. Associated passive atelectasis in the lower lobes with improved aeration in the right lower lobe. Pulmonary venous hypertension without evidence of overt edema. No new pulmonary parenchymal abnormalities.  IMPRESSION: 1. Support apparatus satisfactory. 2. Left chest tube in place with no pneumothorax. 3. Stable moderate cardiomegaly. Pulmonary venous hypertension without overt edema. 4. Small bilateral pleural effusions, decreased in size since yesterday. 5. Mild passive atelectasis in the lower lobes with improved aeration in the right lower lobe. 6. No new abnormalities.   Electronically Signed   By: Evangeline Dakin M.D.   On: 12/16/2014 08:10   Dg Chest Port 1 View  12/15/2014   CLINICAL DATA:  Followup exam.  Pericardial effusion.  EXAM: PORTABLE CHEST - 1 VIEW  COMPARISON:  12/14/2014.  FINDINGS: Cardiopericardial silhouette is enlarged but stable.  There is increased opacity at the right lung base with unchanged opacity at the left lung base, likely combination of pleural effusions and atelectasis. No convincing pulmonary edema. No pneumothorax.  The 2 left-sided chest tubes are stable. Left internal jugular central venous  line has its tip at the confluence of the left brachiocephalic vein and superior vena cava, also stable. Stable right anterior chest wall power Port-A-Cath.  IMPRESSION: 1. Increased opacity at the right lung base likely due to a combination pleural fluid and atelectasis. 2. No other change from the previous day's study. Cardiopericardial silhouette is unchanged  in size.   Electronically Signed   By: Lajean Manes M.D.   On: 12/15/2014 08:12   Dg Chest Port 1 View  12/14/2014   CLINICAL DATA:  Pericardial window, pericardial tamponade and metastatic breast cancer  EXAM: PORTABLE CHEST - 1 VIEW  COMPARISON:  CT 12/13/2014, chest radiograph 12/13/2014  FINDINGS: Right-sided Port-A-Cath in place with tip over the right atrium. Left-sided chest tubes are noted. No pneumothorax. Interval decrease in now small left pleural effusion with persistent retrocardiac opacity. Hazy right lower lobe airspace opacity could indicate atelectasis or pleural fluid. Moderate enlargement of the cardiac silhouette reidentified. New left IJ central line noted with tip at the brachiocephalic/ SVC junction.  IMPRESSION: Decrease in now small left pleural effusion.  No pneumothorax.   Electronically Signed   By: Conchita Paris M.D.   On: 12/14/2014 10:12   Dg Chest Port 1 View  12/08/2014   CLINICAL DATA:  Productive cough x1 month, history of pneumonia and breast cancer  EXAM: PORTABLE CHEST - 1 VIEW  COMPARISON:  11/29/2014  FINDINGS: Moderate to large left pleural effusion, increased.  Increased interstitial markings in the left upper lobe with associated subpleural scarring, grossly unchanged from prior CT.  Right lung is essentially clear.  Cardiomegaly.  Right chest port terminates at the cavoatrial junction.  IMPRESSION: Moderate to large left pleural effusion, increased.  Chronic changes in the left upper lobe, unchanged from prior CT.   Electronically Signed   By: Julian Hy M.D.   On: 12/08/2014 18:03   US  Thoracentesis Asp Pleural Space W/img Guide  12/09/2014   INDICATION: Symptomatic left-sided sided pleural effusion  EXAM: US THORACENTESIS ASP PLEURAL SPACE W/IMG GUIDE  COMPARISON:  None.  MEDICATIONS: 10 cc 1% lidocaine  COMPLICATIONS: None immediate  TECHNIQUE: Informed written consent was obtained from the patient after a discussion of the risks, benefits and alternatives to treatment. A timeout was performed prior to the initiation of the procedure.  Initial ultrasound scanning demonstrates a left pleural effusion. The lower chest was prepped and draped in the usual sterile fashion. 1% lidocaine was used for local anesthesia. Under direct ultrasound guidance, a 19 gauge, 7-cm, Yueh catheter was introduced. An ultrasound image was saved for documentation purposes. The thoracentesis was performed. The catheter was removed and a dressing was applied. The patient tolerated the procedure well without immediate post procedural complication. The patient was escorted to have an upright chest radiograph.  FINDINGS: A total of approximately 750 cc of yellow fluid was removed. Requested samples were sent to the laboratory.  IMPRESSION: Successful ultrasound-guided L sided thoracentesis yielding 750 cc of pleural fluid.  Read by:  Lavonia Drafts [facility]   Electronically Signed   By: Sandi Mariscal M.D.   On: 12/09/2014 11:30    Microbiology: Recent Results (from the past 240 hour(s))  MRSA PCR Screening     Status: None   Collection Time: 12/13/14  4:20 PM  Result Value Ref Range Status   MRSA by PCR NEGATIVE NEGATIVE Final    Comment:        The GeneXpert MRSA Assay (FDA approved for NASAL specimens only), is one component of a comprehensive MRSA colonization surveillance program. It is not intended to diagnose MRSA infection nor to guide or monitor treatment for MRSA infections.   Culture, body fluid-bottle     Status: None   Collection Time: 12/14/14  8:01 AM  Result Value Ref Range Status   Specimen  Description FLUID PERICARDIAL  Final   Special Requests   Final    BOTTLES DRAWN AEROBIC AND ANAEROBIC 10CC PATIENT ON FOLLOWING ZINACEF   Culture NO GROWTH 5 DAYS  Final   Report Status 12/19/2014 FINAL  Final  Gram stain     Status: None   Collection Time: 12/14/14  8:01 AM  Result Value Ref Range Status   Specimen Description FLUID PERICARDIAL  Final   Special Requests PATIENT ON FOLLOWING ZINACEF  Final   Gram Stain   Final    MODERATE WBC PRESENT, PREDOMINANTLY MONONUCLEAR NO ORGANISMS SEEN    Report Status 12/14/2014 FINAL  Final  Culture, body fluid-bottle     Status: None   Collection Time: 12/14/14  8:05 AM  Result Value Ref Range Status   Specimen Description FLUID LEFT PLEURAL  Final   Special Requests PATIENT ON FOLLOWING ZINACEF  Final   Culture NO GROWTH 5 DAYS  Final   Report Status 12/19/2014 FINAL  Final  Gram stain     Status: None   Collection Time: 12/14/14  8:05 AM  Result Value Ref Range Status   Specimen Description FLUID LEFT PLEURAL  Final   Special Requests PATIENT ON FOLLOWING ZINACEF  Final   Gram Stain   Final    MODERATE WBC PRESENT,BOTH PMN AND MONONUCLEAR NO ORGANISMS SEEN    Report Status 12/14/2014 FINAL  Final     Labs: Basic Metabolic Panel:  Recent Labs Lab 12/18/14 0500 12/19/14 0440 12/19/14 1232 12/20/14 0433 12/21/14 0235 12/22/14 0446  NA 135 137  --  138 136 136  K 2.9* 2.8* 3.2* 3.2* 3.1* 3.2*  CL 95* 93*  --  95* 99* 100*  CO2 32 35*  --  35* 30 30  GLUCOSE 99 101*  --  102* 95 100*  BUN 7 <5*  --  <5* <5* <5*  CREATININE 0.62 0.65  --  0.67 0.58 0.67  CALCIUM 7.7* 8.1*  --  8.1* 7.6* 8.2*  MG  --   --  1.5*  --   --  1.9   Liver Function Tests:  Recent Labs Lab 12/16/14 0620  AST 18  ALT 18  ALKPHOS 60  BILITOT 0.9  PROT 5.0*  ALBUMIN 2.4*   No results for input(s): LIPASE, AMYLASE in the last 168 hours. No results for input(s): AMMONIA in the last 168 hours. CBC:  Recent Labs Lab 12/16/14 0620  12/19/14 0440 12/20/14 0433  WBC 8.0 6.3 6.4  HGB 10.6* 10.4* 10.4*  HCT 33.4* 32.0* 32.8*  MCV 94.6 92.0 93.2  PLT 217 224 222   Cardiac Enzymes: No results for input(s): CKTOTAL, CKMB, CKMBINDEX, TROPONINI in the last 168 hours. BNP: BNP (last 3 results)  Recent Labs  09/10/14 1740 12/08/14 1758 12/13/14 1703  BNP 80.9 29.3 21.1    ProBNP (last 3 results) No results for input(s): PROBNP in the last 8760 hours.  CBG: No results for input(s): GLUCAP in the last 168 hours.     Signed:  Dia Crawford, MD [facility] [phone] pager

## 2014-12-22 NOTE — Progress Notes (Addendum)
       [address]       [place] [address]             [phone]          8 Days Post-Op Procedure(s) (LRB): SUBXYPHOID PERICARDIAL WINDOW (N/A) TRANSESOPHAGEAL ECHOCARDIOGRAM (TEE) (N/A) CHEST TUBE INSERTION (Left)  Subjective: Vomited after coughing, otherwise has been feeling ok. Hopeful to go home today.   Objective: Vital signs in last 24 hours: Patient Vitals for the past 24 hrs:  BP Temp Temp src Pulse Resp SpO2 Height Weight  12/22/14 0527 132/68 mmHg 98.2 F (36.8 C) Oral 92 18 96 % 5\' 9"  (1.753 m) 228 lb 6.4 oz (103.602 kg)  12/22/14 0500 - - - - - - - 228 lb 4.8 oz (103.556 kg)  12/22/14 0357 (!) 133/59 mmHg 98.1 F (36.7 C) Oral 90 16 94 % - -  12/22/14 0041 (!) 117/46 mmHg 98.4 F (36.9 C) Oral 86 19 94 % - -  12/21/14 2020 107/60 mmHg 98 F (36.7 C) Oral - (!) 25 - - -  12/21/14 1552 130/70 mmHg 98.3 F (36.8 C) Oral (!) 106 (!) 23 96 % - -  12/21/14 1206 (!) 117/52 mmHg 98.5 F (36.9 C) Oral 85 (!) 23 98 % - -   Current Weight  12/22/14 228 lb 6.4 oz (103.602 kg)     Intake/Output from previous day: 07/07 0701 - 07/08 0700 In: 360 [P.O.:360] Out: -     PHYSICAL EXAM:  Heart: RRR Lungs: Slightly decreased BS in bases, overall clear Wound: Clean and dry    Lab Results: CBC: Recent Labs  12/20/14 0433  WBC 6.4  HGB 10.4*  HCT 32.8*  PLT 222   BMET:  Recent Labs  12/21/14 0235 12/22/14 0446  NA 136 136  K 3.1* 3.2*  CL 99* 100*  CO2 30 30  GLUCOSE 95 100*  BUN <5* <5*  CREATININE 0.58 0.67  CALCIUM 7.6* 8.2*    PT/INR: No results for input(s): LABPROT, INR in the last 72 hours.  CXR: FINDINGS: The lungs are mildly hyperinflated with hemidiaphragm flattening. There is no definite pneumothorax on the left. There small amount of pleural fluid blunting the costophrenic angles bilaterally. There is density corresponding to the previous chest tube track on the left. There is biapical pleural thickening.  The cardiac silhouette is top normal in size. The pulmonary vascularity is not engorged. Port-A-Cath appliance tip projects over the distal third of the SVC. The mediastinum is normal in width. The bony thorax exhibits no acute abnormality. Patient has undergone previous left axillary lymph node dissection.  IMPRESSION: COPD. No recurrent left-sided pneumothorax. Small bilateral pleural effusions.   Assessment/Plan: S/P Procedure(s) (LRB): SUBXYPHOID PERICARDIAL WINDOW (N/A) TRANSESOPHAGEAL ECHOCARDIOGRAM (TEE) (N/A) CHEST TUBE INSERTION (Left) Stable from thoracic surgery standpoint. Ok to d/c home with hospice when ok with medical service. She will need to have her sutures removed in 1 week and this can be done by the [facility].    LOS: 9 days    COLLINS,GINA H 12/22/2014  I have seen and examined the patient and agree with the assessment and plan as outlined.  Rexene Alberts 12/22/2014 8:15 AM

## 2014-12-22 NOTE — Care Management Note (Signed)
Case Management Note  Patient Details  Name: Linda [name] MRN: [id number] Date of Birth: 09-Aug-1945  Subjective/Objective:   Pt tx from [facility]S. Plan for d/c home today with Palliative Services via [place].                Action/Plan: CM did call [facility] to verify plan. Pt had been referred via Chauncey Cruel, MD. Merrily Pew NP had been in contact with pt and has f/u appointment 12-26-14 with Chauncey Cruel, MD. CM did speak with pt in regards to [facility] PT services and pt is refusing services at this time. No further needs from CM at this time.    Expected Discharge Date:                  Expected Discharge Plan:  Home w Hospice Care  In-House Referral:     Discharge planning Services  CM Consult  Post Acute Care Choice:    Choice offered to:  Patient  DME Arranged:    DME Agency:     HH Arranged:  RN [place] Agency:  [facility] ([place] did call [place] and the referral was sent in from Chauncey Cruel, MD)  Status of Service:  Completed, signed off  Medicare Important Message Given:  Yes-fourth notification given Date Medicare IM Given:    Medicare IM give by:    Date Additional Medicare IM Given:    Additional Medicare Important Message give by:     If discussed at [place] of Stay Meetings, dates discussed:    Additional Comments:  Bethena Roys, RN 12/22/2014, 12:24 PM

## 2014-12-22 NOTE — Progress Notes (Signed)
Pt discharge paperwork gone over in detail, including all follow up appointments. Pt verbalizes understanding. Pt discharged home with son.

## 2014-12-22 NOTE — Care Management (Signed)
Important Message  Patient Details  Name: Linda [name] MRN: [id number] Date of Birth: [birth year]   Medicare Important Message Given:  Yes-fourth notification given    Delorse Lek 12/22/2014, 10:34 AM

## 2014-12-22 NOTE — Care Management (Signed)
Important Message  Patient Details  Name: Linda [name] MRN: [id number] Date of Birth: 04-03-1946   Medicare Important Message Given:  Yes-third notification given    Nathen May 12/22/2014, 10:25 AM

## 2014-12-25 ENCOUNTER — Other Ambulatory Visit: Payer: Self-pay | Admitting: *Deleted

## 2014-12-25 ENCOUNTER — Telehealth (HOSPITAL_COMMUNITY): Payer: Self-pay | Admitting: Vascular Surgery

## 2014-12-25 NOTE — Telephone Encounter (Signed)
Left message to change pt 01/12/15 to another date will try back later if I dint receive a call back soon

## 2014-12-26 ENCOUNTER — Telehealth (HOSPITAL_COMMUNITY): Payer: Self-pay | Admitting: Vascular Surgery

## 2014-12-26 ENCOUNTER — Other Ambulatory Visit (HOSPITAL_BASED_OUTPATIENT_CLINIC_OR_DEPARTMENT_OTHER): Payer: Medicare Other

## 2014-12-26 ENCOUNTER — Telehealth: Payer: Self-pay | Admitting: Nurse Practitioner

## 2014-12-26 ENCOUNTER — Encounter: Payer: Self-pay | Admitting: Nurse Practitioner

## 2014-12-26 ENCOUNTER — Ambulatory Visit (HOSPITAL_BASED_OUTPATIENT_CLINIC_OR_DEPARTMENT_OTHER): Payer: Medicare Other | Admitting: Nurse Practitioner

## 2014-12-26 ENCOUNTER — Ambulatory Visit: Payer: Medicare Other

## 2014-12-26 VITALS — BP 130/61 | HR 86 | Temp 98.2°F | Resp 18 | Ht 69.0 in | Wt 229.4 lb

## 2014-12-26 DIAGNOSIS — C7931 Secondary malignant neoplasm of brain: Secondary | ICD-10-CM

## 2014-12-26 DIAGNOSIS — C7951 Secondary malignant neoplasm of bone: Secondary | ICD-10-CM

## 2014-12-26 DIAGNOSIS — I2699 Other pulmonary embolism without acute cor pulmonale: Secondary | ICD-10-CM

## 2014-12-26 DIAGNOSIS — Z170 Estrogen receptor positive status [ER+]: Secondary | ICD-10-CM | POA: Diagnosis not present

## 2014-12-26 DIAGNOSIS — C50912 Malignant neoplasm of unspecified site of left female breast: Secondary | ICD-10-CM

## 2014-12-26 DIAGNOSIS — C50911 Malignant neoplasm of unspecified site of right female breast: Secondary | ICD-10-CM

## 2014-12-26 LAB — CBC WITH DIFFERENTIAL/PLATELET
BASO%: 1 % (ref 0.0–2.0)
BASOS ABS: 0.1 10*3/uL (ref 0.0–0.1)
EOS ABS: 0.2 10*3/uL (ref 0.0–0.5)
EOS%: 3.7 % (ref 0.0–7.0)
HCT: 34.4 % — ABNORMAL LOW (ref 34.8–46.6)
HEMOGLOBIN: 11.3 g/dL — AB (ref 11.6–15.9)
LYMPH#: 1.3 10*3/uL (ref 0.9–3.3)
LYMPH%: 18.9 % (ref 14.0–49.7)
MCH: 30.2 pg (ref 25.1–34.0)
MCHC: 32.9 g/dL (ref 31.5–36.0)
MCV: 91.7 fL (ref 79.5–101.0)
MONO#: 0.6 10*3/uL (ref 0.1–0.9)
MONO%: 8.9 % (ref 0.0–14.0)
NEUT#: 4.5 10*3/uL (ref 1.5–6.5)
NEUT%: 67.5 % (ref 38.4–76.8)
Platelets: 318 10*3/uL (ref 145–400)
RBC: 3.75 10*6/uL (ref 3.70–5.45)
RDW: 16.2 % — ABNORMAL HIGH (ref 11.2–14.5)
WBC: 6.7 10*3/uL (ref 3.9–10.3)

## 2014-12-26 LAB — COMPREHENSIVE METABOLIC PANEL (CC13)
ALK PHOS: 70 U/L (ref 40–150)
ALT: 11 U/L (ref 0–55)
AST: 18 U/L (ref 5–34)
Albumin: 2.8 g/dL — ABNORMAL LOW (ref 3.5–5.0)
Anion Gap: 9 mEq/L (ref 3–11)
BILIRUBIN TOTAL: 0.27 mg/dL (ref 0.20–1.20)
BUN: 8.6 mg/dL (ref 7.0–26.0)
CALCIUM: 9 mg/dL (ref 8.4–10.4)
CHLORIDE: 108 meq/L (ref 98–109)
CO2: 25 meq/L (ref 22–29)
Creatinine: 0.7 mg/dL (ref 0.6–1.1)
EGFR: 83 mL/min/{1.73_m2} — ABNORMAL LOW (ref >90)
GLUCOSE: 148 mg/dL — AB (ref 70–140)
Potassium: 3.5 mEq/L (ref 3.5–5.1)
Sodium: 142 mEq/L (ref 136–145)
Total Protein: 6.2 g/dL — ABNORMAL LOW (ref 6.4–8.3)

## 2014-12-26 NOTE — Telephone Encounter (Signed)
This will be my 3rd time calling and leaving a message to get pt to change her appt 01/12/15

## 2014-12-26 NOTE — Telephone Encounter (Signed)
Left pt a message to get appt scheduled

## 2014-12-26 NOTE — Progress Notes (Signed)
ID: Linda [name]   DOB: February 08, 1946  MR#: [id number]  AJO#:[id number]  PCP: Leeanne Rio, PA-C GYN: SU: Coralie Keens OTHER MD: Crissie Reese, Linna Hoff Bensimhon  CHIEF COMPLAINT:  Bilateral Breast Cancers  CURRENT TREATMENT: Anastrozole  BREAST CANCER HISTORY: From the original intake note:  The patient noted a small amount of drainage from her left breast December of 2012. She brought it to her gynecologist's attention in January of 2013 and was set up for bilateral diagnostic mammography at the breast Center July 24, 2011. This was the patient's first ever mammogram. It showed a large irregular mass in the left retroareolar region extending to the nipple, with nipple retraction and skin thickening. This measured approximately 8.4 cm. It was associated with pleomorphic microcalcifications. By exam there was moderate distortion and retraction of the nipple with a large palpable ill-defined area in the retroareolar region. In the right right breast there was also a 2 cm hard mass palpated. Ultrasound of the right breast mass showed a complex cystic/solid area measuring 1.9 cm. Ultrasound of the right axilla was negative. Ultrasound of the left breast showed a large hypoechoic mass measuring at least 3.8 cm. The left axilla showed several adjacent abnormal appearing lymph nodes.  With this information biopsy of the right and left breast masses were obtained the same day, and showed ([id number])   (a) on the right, and invasive ductal carcinoma with papillary features, estrogen and progesterone receptor positive, HER-2 negative, with an MIB-1 of 10%.   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 The MIB-1 was 60% for the left-sided tumor.   With this information the patient was presented at the multidisciplinary breast cancer conference 08/06/2011. Subsequent evaluation and treatments  are as detailed below.  INTERVAL HISTORY: Linda [name] returns today for follow up of her metastatic breast cancer, accompanied by both of her sons. She was discharged from the hospital on 7/8 after needing drainage of a large hemorrhagic pericardial effusion and pleural effusion, both malignant. There was discussion in the hospital about palliative care, and someone from [facility] called, but she says the message that was left was to talk about hospice which she has not agreed to.  REVIEW OF SYSTEMS: Linda [name] has improved since her hospital stay. The generalized swelling she experienced is now resolving, specifically the edema to her left arm. Her main complaint is fatigue and weakness. She denies fevers, chills, nausea, or vomiting. She takes colace daily for her stools. Her appetite is fair, but she drinks hardly any water. She continues on anastrozole daily and this does not cause her any side effects. The only pain she has is to her chest tube sutures. She denies shortness of breath, cough, or palpitations. She does not sleep well as she cannot lay flat. A detailed review of systems is otherwise stable.   PAST MEDICAL HISTORY: Past Medical History  Diagnosis Date  . Cancer 08-13-11    07-31-11-Dx. Bilarteral Breast cancer-left greater than rt.  . Hematuria, undiagnosed cause 08-13-11    Being evaluated by [facility] 08-14-11  . Hypertension   . Bronchitis     hx  . GERD (gastroesophageal reflux disease)     doing well  . 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 Seroma 02/04/12    right breast  200cc removed  erythema on right side  . Seasonal allergies   . History of radiation therapy 02/20/12-04/15/12    left breast,total 60.4 Gy  . Wears partial dentures     wears upper and lower partial  . Allergy   . Blood transfusion without reported diagnosis   . Radiation-induced  dermatitis 03/26/2012    Using radioplex cream, plus neosporin.   . Cancer of central portion of left female breast 08/01/2011  . S/P radiation therapy 10/09/14-10/27/14    whole brain 37.5Gy/77f  . Acute pulmonary embolism 12/13/2014  . Acute on chronic diastolic CHF (congestive heart failure)     PAST SURGICAL HISTORY: Past Surgical History  Procedure Laterality Date  . Child birth  08-13-11    x3 -NVD  . Portacath placement  08/15/2011    Procedure: INSERTION PORT-A-CATH;  Surgeon: DHarl Bowie MD;  Location: [facility];  Service: General;  Laterality: N/A;  . Mastectomy w/ sentinel node biopsy  01/21/2012    Procedure: MASTECTOMY WITH SENTINEL LYMPH NODE BIOPSY;  Surgeon: DHarl Bowie MD;  [facility]  Service: General;  Laterality: Bilateral;  Left modified radical mastectomy, Rt mastectomy with Sentinel lymphnode biospy  . Breast surgery    . Port-a-cath removal Right 12/22/2012    Procedure: REMOVAL PORT-A-CATH;  Surgeon: DHarl Bowie MD;  [facility]  Service: General;  Laterality: Right;  . Subxyphoid pericardial window N/A 12/14/2014    Procedure: SUBXYPHOID PERICARDIAL WINDOW;  Surgeon: CRexene Alberts MD;  [place] [facility]  Service: Thoracic;  Laterality: N/A;  . Tee without cardioversion N/A 12/14/2014    Procedure: TRANSESOPHAGEAL ECHOCARDIOGRAM (TEE);  Surgeon: CRexene Alberts MD;  [facility]  Service: Thoracic;  Laterality: N/A;  . Chest tube insertion Left 12/14/2014    Procedure: CHEST TUBE INSERTION;  Surgeon: CRexene Alberts MD;  [facility];  Service: Thoracic;  Laterality: Left;    FAMILY HISTORY Family History  Problem Relation Age of Onset  . Heart disease Mother   . Cancer Mother 449   breast, , 766deceased  . Heart attack Father   . Cancer Sister 645   breast  . Colon cancer Neg Hx   The patient's father died from a heart attack at the age of 829 The patient's mother died from  apparently heart problems at the age of 840 The patient had no brothers. She has 3 sisters. One of her sisters was diagnosed with breast cancer in her mid 653s The patient does not know if his sister was ever genetically tested. The patient's mother had a mastectomy at the age of 339 presumably for breast cancer. There is no other history of breast or in cancer in the family to her knowledge.   GYNECOLOGIC HISTORY:  She does not recall when she had menarche. She had her first child at age 69 She is GX P3. She underwent menopause in her mid 465s She never took hormone replacement.   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 [place] has a cGames developerin [place] Son [place]erald [place]ives [place] a fAirline pilot His wife is a nMarine scientist Son DShanon Browlives at [employer]and is a dAdvertising account executivein addition to having the m[employer] The patient attends  a local [employer] here   ADVANCED DIRECTIVES: Not in place; Not in place. At the clinic visit 12/26/2014 the patient was given the appropriate forms to complete and notarize at her discretion. She tells me she intends to name her daughter-in-law Jacqlyn Larsen as her healthcare power of attorney  HEALTH MAINTENANCE: History  Substance Use Topics  . Smoking status: Never Smoker   . Smokeless tobacco: Never Used  . Alcohol Use: 0.0 oz/week    0 Standard drinks or equivalent per week     Comment: rare- occ.     Colonoscopy: Never  PAP: Feb 2013  Bone density: November 2013, normal  Lipid panel:   Allergies  Allergen Reactions  . Ace Inhibitors Cough    Current Outpatient Prescriptions  Medication Sig Dispense Refill  . anastrozole (ARIMIDEX) 1 MG tablet TAKE 1 TABLET BY MOUTH DAILY. 30 tablet 5  . b complex vitamins tablet Take 1 tablet by mouth daily.    . cholecalciferol  (VITAMIN D) 1000 UNITS tablet Take 1 tablet (1,000 Units total) by mouth daily. 100 tablet 12  . furosemide (LASIX) 20 MG tablet Take 1 tablet (20 mg total) by mouth every other day. 30 tablet 0  . magnesium oxide (MAG-OX) 400 (241.3 MG) MG tablet Take 1 tablet (400 mg total) by mouth daily. 30 tablet 0  . Multiple Vitamins-Iron (MULTIVITAMINS WITH IRON) TABS Take 1 tablet by mouth daily.    . potassium chloride SA (K-DUR,KLOR-CON) 20 MEQ tablet Take 2 tablets (40 mEq total) by mouth daily. 30 tablet 0  . spironolactone (ALDACTONE) 25 MG tablet Take 1 tablet (25 mg total) by mouth daily. 30 tablet 0  . ALPRAZolam (XANAX) 0.25 MG tablet Take 1 tablet (0.25 mg total) by mouth 2 (two) times daily as needed for anxiety. (Patient not taking: Reported on 12/26/2014) 30 tablet 0  . benzonatate (TESSALON) 100 MG capsule Take 1 capsule (100 mg total) by mouth 3 (three) times daily as needed for cough. (Patient not taking: Reported on 12/26/2014) 20 capsule 2  . HYDROcodone-homatropine (HYCODAN) 5-1.5 MG/5ML syrup Take 5 mLs by mouth every 6 (six) hours as needed for cough. (Patient not taking: Reported on 12/26/2014) 240 mL 0  . lidocaine-prilocaine (EMLA) cream Apply 1 application topically as needed. (Patient not taking: Reported on 12/26/2014) 30 g 1  . nystatin cream (MYCOSTATIN) Apply topically 3 (three) times daily. (Patient not taking: Reported on 12/26/2014) 30 g 0  . oxyCODONE (OXY IR/ROXICODONE) 5 MG immediate release tablet Take 1-2 tablets (5-10 mg total) by mouth every 4 (four) hours as needed for severe pain. (Patient not taking: Reported on 12/26/2014) 30 tablet 0  . tobramycin-dexamethasone (TOBRADEX) ophthalmic solution Place 1 drop into the left eye every 4 (four) hours while awake.    . traMADol (ULTRAM) 50 MG tablet Take 1-2 tablets (50-100 mg total) by mouth every 6 (six) hours as needed (mild pain). (Patient not taking: Reported on 12/26/2014) 30 tablet 0  . zolpidem (AMBIEN) 5 MG tablet Take 1  tablet (5 mg total) by mouth at bedtime as needed for sleep. (Patient not taking: Reported on 12/26/2014) 30 tablet 0   No current facility-administered medications for this visit.    OBJECTIVE: [name] who appears stated age 71 Vitals:   12/26/14 1004  BP: 130/61  Pulse: 86  Temp: 98.2 F (36.8 C)  Resp: 18     Body mass index is 33.86 kg/(m^2).    ECOG FS: 2 Filed Weights   12/26/14  1004  Weight: 229 lb 6.4 oz (104.055 kg)   Skin: warm, dry  HEENT: sclerae anicteric, conjunctivae pink, oropharynx clear. No thrush or mucositis.  Lymph Nodes: No cervical or supraclavicular lymphadenopathy  Lungs: clear to auscultation bilaterally, no rales, wheezes, or rhonci  Heart: regular rate and rhythm  Abdomen: round, soft, non tender, positive bowel sounds  Musculoskeletal: No focal spinal tenderness, grade 1 left upper extremity edema.  Neuro: non focal, well oriented, positive affect  Breasts: deferre   LAB RESULTS: Lab Results  Component Value Date   WBC 6.7 12/26/2014   NEUTROABS 4.5 12/26/2014   HGB 11.3* 12/26/2014   HCT 34.4* 12/26/2014   MCV 91.7 12/26/2014   PLT 318 12/26/2014      Chemistry      Component Value Date/Time   NA 142 12/26/2014 0948   NA 136 12/22/2014 0446   K 3.5 12/26/2014 0948   K 3.2* 12/22/2014 0446   CL 100* 12/22/2014 0446   CL 107 11/01/2012 1045   CO2 25 12/26/2014 0948   CO2 30 12/22/2014 0446   BUN 8.6 12/26/2014 0948   BUN <5* 12/22/2014 0446   CREATININE 0.7 12/26/2014 0948   CREATININE 0.67 12/22/2014 0446   CREATININE 0.61 06/30/2013 0959      Component Value Date/Time   CALCIUM 9.0 12/26/2014 0948   CALCIUM 8.2* 12/22/2014 0446   ALKPHOS 70 12/26/2014 0948   ALKPHOS 60 12/16/2014 0620   AST 18 12/26/2014 0948   AST 18 12/16/2014 0620   ALT 11 12/26/2014 0948   ALT 18 12/16/2014 0620   BILITOT 0.27 12/26/2014 0948   BILITOT 0.9 12/16/2014 0620       STUDIES: Dg Chest 1 View  12/13/2014   CLINICAL  DATA:  69 year old female with history of breast cancer and shortness of breath.  EXAM: CHEST  1 VIEW  COMPARISON:  Chest radiograph dated 12/09/2014.  FINDINGS: Right IJ central Port-A-Cath with tip at atrial caval junction in stable positioning. There has been interval increase in the left lower lung field opacity likely combination of pleural effusion with associated compressive atelectasis of the adjacent lung. Superimposed pneumonia is not excluded. Small right pleural effusion. No pneumothorax. Stable cardiac silhouette. Grossly unremarkable osseous structures. Left axillary surgical clips.  IMPRESSION: Interval increase in the left pleural effusion with consolidative changes of the adjacent lung likely atelectasis/ pneumonia. Clinical correlation and follow-up recommended.   Electronically Signed   By: Anner Crete M.D.   On: 12/13/2014 17:36   Dg Chest 1 View  12/09/2014   CLINICAL DATA:  Pleural effusion.  Post left-sided thoracentesis.  EXAM: CHEST  1 VIEW  COMPARISON:  Chest radiograph- 12/08/2014  FINDINGS: Interval reduction in persistent small left-sided effusion post thoracentesis. No pneumothorax.  Grossly unchanged enlarged cardiac silhouette and mediastinal contours given persistently reduced lung volumes. Stable position of support apparatus.  Improved aeration of the left lung base with persistent bibasilar opacities, left greater than right. No new focal airspace opacities.  Surgical clips overlie the left axilla. Osseous structures appear normal.  IMPRESSION: 1. Interval reduction in persistent small left-sided effusion post thoracentesis. No pneumothorax. 2. Improved aeration the left lung base with residual bibasilar opacities, left greater than right, likely atelectasis.   Electronically Signed   By: Sandi Mariscal M.D.   On: 12/09/2014 11:39   Dg Chest 2 View  12/22/2014   CLINICAL DATA:  Status post removal of the left-sided chest tube yesterday  EXAM: CHEST  2 VIEW  COMPARISON:  Portable chest x-ray of December 21, 2014  FINDINGS: The lungs are mildly hyperinflated with hemidiaphragm flattening. There is no definite pneumothorax on the left. There small amount of pleural fluid blunting the costophrenic angles bilaterally. There is density corresponding to the previous chest tube track on the left. There is biapical pleural thickening. The cardiac silhouette is top normal in size. The pulmonary vascularity is not engorged. Port-A-Cath appliance tip projects over the distal third of the SVC. The mediastinum is normal in width. The bony thorax exhibits no acute abnormality. Patient has undergone previous left axillary lymph node dissection.  IMPRESSION: COPD. No recurrent left-sided pneumothorax. Small bilateral pleural effusions.   Electronically Signed   By: David  Martinique M.D.   On: 12/22/2014 07:55   Dg Chest 2 View  11/29/2014   CLINICAL DATA:  Breast cancer  EXAM: CHEST  2 VIEW  COMPARISON:  09/11/2014  FINDINGS: Small left pleural effusion shows mild interval improvement. Mild left lower lobe atelectasis also improved. Minimal pleural effusion on the right.  Cardiac enlargement. Pericardial effusion noted on prior CT. Negative for heart failure or edema. Port-A-Cath tip in the lower SVC.  IMPRESSION: Mild improvement in left pleural effusion and left lower lobe atelectasis. Minimal right pleural effusion.  Cardiac enlargement consistent with pericardial effusion.   Electronically Signed   By: Franchot Gallo M.D.   On: 11/29/2014 15:58   Ct Angio Chest Pe W/cm &/or Wo Cm  12/13/2014   CLINICAL DATA:  Shortness of breath and coughing. Worsening swelling of left arm. Breast cancer diagnosed 2/13 with right mastectomy and left modified radical mastectomy. Radiation therapy. Last chemotherapy 4/16.  EXAM: CT ANGIOGRAPHY CHEST WITH CONTRAST  TECHNIQUE: Multidetector CT imaging of the chest was performed using the standard protocol during bolus administration of intravenous contrast.  Multiplanar CT image reconstructions and MIPs were obtained to evaluate the vascular anatomy.  CONTRAST:  30m OMNIPAQUE IOHEXOL 350 MG/ML SOLN  COMPARISON:  Plain film 12/13/2014.  Chest CT of 09/11/2014  FINDINGS: Mediastinum/Nodes: The quality of this exam for evaluation of pulmonary embolism is sufficient. Relatively small volume but definite pulmonary emboli. Examples within a right upper lobe segmental branch in image 76 of series 6 and a right lower lobe subsegmental branch on image 157 of series 6.  No supraclavicular adenopathy. A right Port-A-Cath which terminates at the mid to high at right atrium. Normal heart size. Moderate to large pericardial effusion, increased. Multivessel coronary artery atherosclerosis. Lad coronary artery atherosclerosis on image 44. Elevated right heart pressures, with reflux of contrast into the IVC and hepatic veins.  Resolution of previously described mediastinal adenopathy. No hilar adenopathy. No internal mammary adenopathy. Left axillary node dissection, without axillary adenopathy. Bilateral mastectomy.  Lungs/Pleura: Moderate left and small right pleural effusions, both enlarged since the prior CT. Motion and patient body habitus degradation. Right base subsegmental atelectasis.  Subpleural radiation fibrosis within the left upper lobe. Collapse/consolidative change in the left lower lobe. Improved since 09/11/2014.  Upper abdomen: Normal imaged portions of the liver, spleen, stomach, adrenal glands. Trace perihepatic ascites.  Musculoskeletal: New heterogeneous sclerosis, consistent with osseous metastasis.  Review of the MIP images confirms the above findings.  IMPRESSION: 1. Small volume pulmonary emboli. These results were called by telephone at the time of interpretation on 12/13/2014 at 6:09 pm to covering nurse, KMelvenia Needles who verbally acknowledged these results. 2. Enlargement of bilateral pleural effusions and a now large pericardial effusion. suggestion of elevated  right heart pressures. 3.  Atherosclerosis, including within  the coronary arteries. 4. Left base aeration which is improved. Residual collapse/consolidative changes could represent atelectasis and/or infection. 5. Resolution of mediastinal and hilar adenopathy. 6. Development of osseous metastasis. 7. Trace perihepatic ascites.   Electronically Signed   By: Abigail Miyamoto M.D.   On: 12/13/2014 18:13   Dg Chest Port 1 View  12/21/2014   CLINICAL DATA:  Chest tube removal.  EXAM: PORTABLE CHEST - 1 VIEW  COMPARISON:  12/21/2014  FINDINGS: Left chest tube removed.  Negative for pneumothorax.  Port-A-Cath tip at the cavoatrial junction unchanged.  Bibasilar atelectasis with interval improvement in aeration in the bases since earlier today.  IMPRESSION: Left chest tube removal.  No pneumothorax  Improved aeration in the lung bases.   Electronically Signed   By: Franchot Gallo M.D.   On: 12/21/2014 13:55   Dg Chest Port 1 View  12/21/2014   CLINICAL DATA:  Sub xiphoid pericardial window creation, chest tube check  EXAM: PORTABLE CHEST - 1 VIEW  COMPARISON:  Portable chest x-ray of December 20, 2014  FINDINGS: The left upper chest tube is unchanged in position. There is no pneumothorax. No significant pleural effusion on the left is observed. On the right there is small amount of pleural fluid as well as basilar atelectasis which is stable. The cardiac silhouette is mildly enlarged but stable. The pulmonary vascularity is less engorged today. The power port appliance tip projects over the distal third of the SVC.  IMPRESSION: 1. Stable appearance of the left chest tube. There is no pneumothorax or significant left pleural effusion. 2. Persistent right lower lobe atelectasis or infiltrate and small right pleural effusion. 3. Decreased pulmonary vascular congestion.   Electronically Signed   By: David  Martinique M.D.   On: 12/21/2014 08:01   Dg Chest Port 1 View  12/20/2014   CLINICAL DATA:  Pleural effusion, chest tube  treatment  EXAM: PORTABLE CHEST - 1 VIEW  COMPARISON:  Portable chest x-ray of December 18, 2014  FINDINGS: The lower left chest tube appears to been removed. The upper chest tube is stable with its tip projecting over the posterior aspect of the fourth rib. No significant pleural effusion is observed currently. There is persistent left lower lobe atelectasis or pneumonia. On the right the lung is adequately inflated. There is basilar atelectasis and small pleural effusion, unchanged. The cardiac silhouette remains enlarged. The pulmonary vascularity is engorged. The power port appliance tip projects over the distal third of the SVC. The observed bony thorax is unremarkable.  IMPRESSION: 1. Interval removal of the lower left chest tube. The upper chest tube is unchanged. There is no significant left pleural effusion and no left pneumothorax. 2. Persistent bibasilar atelectasis or infiltrate greater on the left than on the right. A small right pleural effusion is suspected. 3. Mild cardiac enlargement with pulmonary vascular congestion consistent with CHF.   Electronically Signed   By: David  Martinique M.D.   On: 12/20/2014 07:39   Dg Chest Port 1 View  12/18/2014   CLINICAL DATA:  Chest tube.  EXAM: PORTABLE CHEST - 1 VIEW  COMPARISON:  12/17/2014  FINDINGS: Right Port-A-Cath and left chest tube remain in place, unchanged. No pneumothorax. There is cardiomegaly. Vascular congestion. Perihilar and bibasilar atelectasis or infiltrates. Small bilateral effusions.  IMPRESSION: Cardiomegaly with vascular congestion.  Low lung volumes with bibasilar atelectasis and small effusions.  No pneumothorax.   Electronically Signed   By: Rolm Baptise M.D.   On: 12/18/2014 08:40  Dg Chest Port 1 View  12/17/2014   CLINICAL DATA:  Chest tube.  Fluid on the lungs.  Lobectomy.  EXAM: PORTABLE CHEST - 1 VIEW  COMPARISON:  12/16/2014.  FINDINGS: Cardiopericardial silhouette is enlarged. Interval removal of the LEFT IJ central line. RIGHT  IJ Port-A-Cath unchanged.  LEFT thoracostomy tube unchanged. Drain overlying the LEFT upper quadrant also appears unchanged. Bilateral basilar opacity, likely representing atelectasis appears similar to yesterday's exam.  LEFT axillary dissection clips noted.  There is no pneumothorax.  IMPRESSION: 1. Interval removal of LEFT IJ central line. Other support apparatus unchanged. 2. No interval change in the heart or lungs.   Electronically Signed   By: Dereck Ligas M.D.   On: 12/17/2014 09:34   Dg Chest Port 1 View  12/16/2014   CLINICAL DATA:  Current history of metastatic breast cancer with malignant left pleural effusion and pericardial effusion. Indwelling left chest tube. Acute pulmonary emboli. Followup bilateral effusions and atelectasis in the lower lobes.  EXAM: PORTABLE CHEST - 1 VIEW  COMPARISON:  12/15/2014 and earlier.  FINDINGS: Cardiac silhouette moderately enlarged, unchanged. Left chest tube in place with no pneumothorax. Right jugular Port-A-Cath tip projects at the expected location the cavoatrial junction. Left jugular central venous catheter tip projects at the junction of the innominate vein and SVC, unchanged. Small bilateral pleural effusions, slightly improved since yesterday. Associated passive atelectasis in the lower lobes with improved aeration in the right lower lobe. Pulmonary venous hypertension without evidence of overt edema. No new pulmonary parenchymal abnormalities.  IMPRESSION: 1. Support apparatus satisfactory. 2. Left chest tube in place with no pneumothorax. 3. Stable moderate cardiomegaly. Pulmonary venous hypertension without overt edema. 4. Small bilateral pleural effusions, decreased in size since yesterday. 5. Mild passive atelectasis in the lower lobes with improved aeration in the right lower lobe. 6. No new abnormalities.   Electronically Signed   By: Evangeline Dakin M.D.   On: 12/16/2014 08:10   Dg Chest Port 1 View  12/15/2014   CLINICAL DATA:  Followup exam.   Pericardial effusion.  EXAM: PORTABLE CHEST - 1 VIEW  COMPARISON:  12/14/2014.  FINDINGS: Cardiopericardial silhouette is enlarged but stable.  There is increased opacity at the right lung base with unchanged opacity at the left lung base, likely combination of pleural effusions and atelectasis. No convincing pulmonary edema. No pneumothorax.  The 2 left-sided chest tubes are stable. Left internal jugular central venous line has its tip at the confluence of the left brachiocephalic vein and superior vena cava, also stable. Stable right anterior chest wall power Port-A-Cath.  IMPRESSION: 1. Increased opacity at the right lung base likely due to a combination pleural fluid and atelectasis. 2. No other change from the previous day's study. Cardiopericardial silhouette is unchanged in size.   Electronically Signed   By: Lajean Manes M.D.   On: 12/15/2014 08:12   Dg Chest Port 1 View  12/14/2014   CLINICAL DATA:  Pericardial window, pericardial tamponade and metastatic breast cancer  EXAM: PORTABLE CHEST - 1 VIEW  COMPARISON:  CT 12/13/2014, chest radiograph 12/13/2014  FINDINGS: Right-sided Port-A-Cath in place with tip over the right atrium. Left-sided chest tubes are noted. No pneumothorax. Interval decrease in now small left pleural effusion with persistent retrocardiac opacity. Hazy right lower lobe airspace opacity could indicate atelectasis or pleural fluid. Moderate enlargement of the cardiac silhouette reidentified. New left IJ central line noted with tip at the brachiocephalic/ SVC junction.  IMPRESSION: Decrease in now small left pleural  effusion.  No pneumothorax.   Electronically Signed   By: Conchita Paris M.D.   On: 12/14/2014 10:12   Dg Chest Port 1 View  12/08/2014   CLINICAL DATA:  Productive cough x1 month, history of pneumonia and breast cancer  EXAM: PORTABLE CHEST - 1 VIEW  COMPARISON:  11/29/2014  FINDINGS: Moderate to large left pleural effusion, increased.  Increased interstitial markings  in the left upper lobe with associated subpleural scarring, grossly unchanged from prior CT.  Right lung is essentially clear.  Cardiomegaly.  Right chest port terminates at the cavoatrial junction.  IMPRESSION: Moderate to large left pleural effusion, increased.  Chronic changes in the left upper lobe, unchanged from prior CT.   Electronically Signed   By: Julian Hy M.D.   On: 12/08/2014 18:03   US Thoracentesis Asp Pleural Space W/img Guide  12/09/2014   INDICATION: Symptomatic left-sided sided pleural effusion  EXAM: US THORACENTESIS ASP PLEURAL SPACE W/IMG GUIDE  COMPARISON:  None.  MEDICATIONS: 10 cc 1% lidocaine  COMPLICATIONS: None immediate  TECHNIQUE: Informed written consent was obtained from the patient after a discussion of the risks, benefits and alternatives to treatment. A timeout was performed prior to the initiation of the procedure.  Initial ultrasound scanning demonstrates a left pleural effusion. The lower chest was prepped and draped in the usual sterile fashion. 1% lidocaine was used for local anesthesia. Under direct ultrasound guidance, a 19 gauge, 7-cm, Yueh catheter was introduced. An ultrasound image was saved for documentation purposes. The thoracentesis was performed. The catheter was removed and a dressing was applied. The patient tolerated the procedure well without immediate post procedural complication. The patient was escorted to have an upright chest radiograph.  FINDINGS: A total of approximately 750 cc of yellow fluid was removed. Requested samples were sent to the laboratory.  IMPRESSION: Successful ultrasound-guided L sided thoracentesis yielding 750 cc of pleural fluid.  Read by:  Lavonia Drafts [facility]   Electronically Signed   By: Sandi Mariscal M.D.   On: 12/09/2014 11:30     ASSESSMENT: 69 y.o.  [place] woman   (1)  status post bilateral breast biopsies 07/24/2011, showing,      (a) on the right, a clinical T2 N0 papillary/ductal breast cancer, estrogen  and progesterone receptor positive, HER-2 negative, with an MIB-1 of 10%;     (b) on the left, a clinical T3 N1, stage IIIA invasive ductal carcinoma, grade 3, triple positive, with an MIB-1 of 60%.  (2)  Status post 4 dose dense cycles of doxorubicin/ cyclophosphamide followed by 4 dose dense cycles of paclitaxel and trastuzumab completed 12/09/2011  (3) the trastuzumab was continued for a total of one year (to 11/01/2012). Final echo on 11/04/2012 showing a well preserved ejection fraction of 55-60%.  (4) s/p bilateral mastectomies 01/21/2012 showing   (a) on the Right, an 8 mm invasive papillary carcinoma, grade 1, ypT1b ypN0   (b) on the Left, multiple microscopic foci of residual  Invasive ductal carcinoma with evidence of dermal lymphatic involvement, pyT1a/T4 pyN0  (5)  Postmastectomy radiation, completed 04/15/2012  (6) Started anastrazole 04/16/2012; normal dexa scan 05/19/2014 at the [facility] 09/11/2014 (7) CT angiogram 09/11/2014 shows new left pericardial effusion and new mediastinal and hilar lymphadenopathy; there were no suspicious upper abdominal findings; Cytology from the left effusion 09/11/2014 ([id number]) shows malignant cells which are HER-2 positive  (8) Whole body bone scan on 09/25/14 showed metastatic foci throughout axial and appendicular skeleton. Areas  of most potential concern include disease in the thoracic spine, disease in the right acetabulum, right superior pubic ramus and possibly proximal right femur, disease in the right humeral shaft and in both femoral shafts.  (8) Started trastuzumab/pertuzumab 09/26/2014, to be repeated every 21 days indefinitely  (a) echocardiogram 09/11/2014 shows a well preserved ejection fraction.  (9) Brain MRI on 09/25/14 was positive for numerous small enhancing brain mets and calvarium/bone mets   (a) whole brain radiation on 10/09/14--10/27/2014  (10) started zolendronate 11/29/2014, to be repeated  every 12 weeks  11) left pleural effusion re-tapped 12/08/2014 (a) left chest tube placement 12/14/2014  (12) echo 12/13/2014 showed large pericardial effusion (a) pericardial window placement 12/14/2014; fluid is hemorrhagic; path negative  (13) Right-sided pulmonary emboli noted on CT scan 12/13/2014  PLAN: Dr. Jana Hakim was brought into the visit as Linda [name] is agreeable to palliative care, but she prefers to continue with active treatment with the options she has left available. The first drug Dr. Jana Hakim would like to try is TDM-1, which is given every 3 weeks. He explained to her that is similar to the trastuzumab that she tolerated well previously, with a chemo molecule attached to it. It can cause fatigue and irritation to the liver and this was discussed with the patient. She had an echocardiogram 2 weeks ago that showed a well preserved ejection fraction.   Linda [name]'s daughter-in-law will return the call to [place] with the understanding that [place] will require in home palliative care alone at this time.  She will begin this treatment on 7/19 and continue anastrozole daily. She is concerned about the risk of increased fatigue, seeing as how this is something she is battling now. Prior to cycle 3, she will discuss whether or not this will be a good fit with Dr. Jana Hakim. If it is, they will also discuss the timing of scans in the future. Linda [name] and her sons understand and agree with this plan. They know the goal of treatment in her case is control. She has been encouraged to call with any issues that might arise before her next visit here.   Laurie Panda, NP   12/26/2014   ADDENDUM: Linda [name] is feeling much better so she is considering more life prolonging treatment. One of her sons is very much in favor of this and wonders if we should also place an IVC filter given her history of clots.  Even though pattern is clearly  better her functional status is very marginal. I don't know how she is going to tolerate even the relatively mild treatments she will receive, which is T-DM 1. She may feel exceedingly fatigued from this. We did discuss the possible toxicities, side effects and complications and at this point they are willing to proceed. She is going to see me after 2 cycles and at that point we will make a decision whether to continue or go back to the full hospice idea. At that time also we will decide whether she should be anticoagulated or have an IVC filter placed  Today we are placing a home care palliative consult.  I personally saw this patient and performed a substantive portion of this encounter with the listed APP documented above.   Chauncey Cruel, MD Medical Oncology and Hematology [facility] [address] [place], [place] [address] Tel. [phone]    Fax. [phone]

## 2014-12-26 NOTE — Telephone Encounter (Signed)
Gave avs & calendar for July/August. °

## 2014-12-29 ENCOUNTER — Telehealth: Payer: Self-pay | Admitting: *Deleted

## 2014-12-29 ENCOUNTER — Encounter: Payer: Self-pay | Admitting: Oncology

## 2014-12-29 ENCOUNTER — Other Ambulatory Visit: Payer: Self-pay | Admitting: Thoracic Surgery (Cardiothoracic Vascular Surgery)

## 2014-12-29 DIAGNOSIS — I313 Pericardial effusion (noninflammatory): Secondary | ICD-10-CM

## 2014-12-29 DIAGNOSIS — I3139 Other pericardial effusion (noninflammatory): Secondary | ICD-10-CM

## 2014-12-29 NOTE — Progress Notes (Signed)
I faxed fmla for chris to  [phone] and called and left him a message copy is waiting for pick at front desk with [facility] for his records

## 2014-12-29 NOTE — Telephone Encounter (Signed)
[facility] and spoke to Vickie(referral) concerning the orders for pt to receive in home palliative care. Referral coordinator says everything looks to be in place and active as far as orders are concerned and she will get Josh to visit pt and family next week in the home. Message to be fwd to Gentry Fitz, NP.

## 2015-01-01 ENCOUNTER — Ambulatory Visit (INDEPENDENT_AMBULATORY_CARE_PROVIDER_SITE_OTHER): Payer: Self-pay | Admitting: Physician Assistant

## 2015-01-01 ENCOUNTER — Ambulatory Visit
Admission: RE | Admit: 2015-01-01 | Discharge: 2015-01-01 | Disposition: A | Discharge status: Home / Self Care | Payer: Medicare Other | Source: Ambulatory Visit | Attending: Thoracic Surgery (Cardiothoracic Vascular Surgery) | Admitting: Thoracic Surgery (Cardiothoracic Vascular Surgery)

## 2015-01-01 VITALS — BP 119/75 | HR 95 | Resp 16 | Ht 69.0 in | Wt 228.0 lb

## 2015-01-01 DIAGNOSIS — Z9889 Other specified postprocedural states: Secondary | ICD-10-CM

## 2015-01-01 DIAGNOSIS — I313 Pericardial effusion (noninflammatory): Secondary | ICD-10-CM

## 2015-01-01 DIAGNOSIS — J90 Pleural effusion, not elsewhere classified: Secondary | ICD-10-CM

## 2015-01-01 DIAGNOSIS — I3139 Other pericardial effusion (noninflammatory): Secondary | ICD-10-CM

## 2015-01-01 DIAGNOSIS — J948 Other specified pleural conditions: Secondary | ICD-10-CM

## 2015-01-01 DIAGNOSIS — I319 Disease of pericardium, unspecified: Secondary | ICD-10-CM

## 2015-01-01 DIAGNOSIS — Z9289 Personal history of other medical treatment: Secondary | ICD-10-CM

## 2015-01-01 NOTE — Progress Notes (Signed)
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 Since hospital discharge the patient reports she has been doing okay.  She states that she has been experiencing some dizziness recently.  She states she was recently instructed to resume a blood pressure medication, she does not however know the name of this.  She states she did not take the medication this morning because she was dizzy.  Her BP in the office is normal today at 119/75.  She otherwise has no complaints.   Current Outpatient Prescriptions  Medication Sig Dispense Refill  . anastrozole (ARIMIDEX) 1 MG tablet TAKE 1 TABLET BY MOUTH DAILY. 30 tablet 5  . b complex vitamins tablet Take 1 tablet by mouth daily.    . benzonatate (TESSALON) 100 MG capsule Take 1 capsule (100 mg total) by mouth 3 (three) times daily as needed for cough. 20 capsule 2  . cholecalciferol (VITAMIN D) 1000 UNITS tablet Take 1 tablet (1,000 Units total) by mouth daily. 100 tablet 12  . furosemide (LASIX) 20 MG tablet Take 1 tablet (20 mg total) by mouth every other day. 30 tablet 0  . magnesium oxide (MAG-OX) 400 (241.3 MG) MG tablet Take 1 tablet (400 mg total) by mouth daily. 30 tablet 0  . Multiple Vitamins-Iron (MULTIVITAMINS WITH IRON) TABS Take 1 tablet by mouth daily.    . potassium chloride SA (K-DUR,KLOR-CON) 20 MEQ tablet Take 2 tablets (40 mEq total) by mouth daily. 30 tablet 0  . spironolactone (ALDACTONE) 25 MG tablet Take 1 tablet (25 mg total) by mouth daily. 30 tablet 0  . tobramycin-dexamethasone (TOBRADEX) ophthalmic solution Place 1 drop into the left eye every 4 (four) hours while awake.    [name] ALPRAZolam (XANAX) 0.25 MG tablet Take 1 tablet (0.25 mg total) by mouth 2 (two) times daily as needed for anxiety. (Patient not taking: Reported on 12/26/2014) 30 tablet 0  . HYDROcodone-homatropine (HYCODAN)  5-1.5 MG/5ML syrup Take 5 mLs by mouth every 6 (six) hours as needed for cough. (Patient not taking: Reported on 12/26/2014) 240 mL 0  . lidocaine-prilocaine (EMLA) cream Apply 1 application topically as needed. (Patient not taking: Reported on 12/26/2014) 30 g 1  . nystatin cream (MYCOSTATIN) Apply topically 3 (three) times daily. (Patient not taking: Reported on 12/26/2014) 30 g 0  . oxyCODONE (OXY IR/ROXICODONE) 5 MG immediate release tablet Take 1-2 tablets (5-10 mg total) by mouth every 4 (four) hours as needed for severe pain. (Patient not taking: Reported on 12/26/2014) 30 tablet 0  . traMADol (ULTRAM) 50 MG tablet Take 1-2 tablets (50-100 mg total) by mouth every 6 (six) hours as needed (mild pain). (Patient not taking: Reported on 12/26/2014) 30 tablet 0  . zolpidem (AMBIEN) 5 MG tablet Take 1 tablet (5 mg total) by mouth at bedtime as needed for sleep. (Patient not taking: Reported on 12/26/2014) 30 tablet 0   No current facility-administered medications for this visit.    Physical Exam:  BP 119/75 mmHg  Pulse 95  Resp 16  Ht 5\' 9"  (1.753 m)  Wt 228 lb (103.42 kg)  BMI 33.65 kg/m2  SpO2 98%  Gen: no apparent distress Heart: RRR Lungs: CTA bilaterally Wound: clean and dry  Diagnostic Tests:  CXR: small bilateral pleural effusions, some residual pleural scarring   1. S/P Sub Xiphoid Pericardial Window/Chest tube placement- doing well from surgery standpoint.  Wound s are healing 2.  Dizziness- likely due to diuretic use or blood pressure medication.  Will defer to Cardiology... Patient states she has an appointment with Dr. Sung Amabile tomorrow  Ellwood Handler, PA-C [facility] [phone]

## 2015-01-02 ENCOUNTER — Other Ambulatory Visit: Payer: Self-pay | Admitting: *Deleted

## 2015-01-02 ENCOUNTER — Ambulatory Visit (HOSPITAL_BASED_OUTPATIENT_CLINIC_OR_DEPARTMENT_OTHER): Payer: Medicare Other

## 2015-01-02 ENCOUNTER — Other Ambulatory Visit: Payer: Self-pay | Admitting: Oncology

## 2015-01-02 ENCOUNTER — Other Ambulatory Visit (HOSPITAL_BASED_OUTPATIENT_CLINIC_OR_DEPARTMENT_OTHER): Payer: Medicare Other

## 2015-01-02 VITALS — BP 121/68 | HR 93 | Temp 98.4°F | Resp 18

## 2015-01-02 DIAGNOSIS — C7931 Secondary malignant neoplasm of brain: Secondary | ICD-10-CM

## 2015-01-02 DIAGNOSIS — C50912 Malignant neoplasm of unspecified site of left female breast: Secondary | ICD-10-CM

## 2015-01-02 DIAGNOSIS — C7951 Secondary malignant neoplasm of bone: Secondary | ICD-10-CM | POA: Diagnosis not present

## 2015-01-02 DIAGNOSIS — Z5112 Encounter for antineoplastic immunotherapy: Secondary | ICD-10-CM | POA: Diagnosis present

## 2015-01-02 DIAGNOSIS — C50911 Malignant neoplasm of unspecified site of right female breast: Secondary | ICD-10-CM | POA: Diagnosis present

## 2015-01-02 DIAGNOSIS — J910 Malignant pleural effusion: Secondary | ICD-10-CM

## 2015-01-02 LAB — CBC WITH DIFFERENTIAL/PLATELET
BASO%: 0.7 % (ref 0.0–2.0)
BASOS ABS: 0 10*3/uL (ref 0.0–0.1)
EOS%: 2 % (ref 0.0–7.0)
Eosinophils Absolute: 0.1 10*3/uL (ref 0.0–0.5)
HCT: 35.1 % (ref 34.8–46.6)
HGB: 11.4 g/dL — ABNORMAL LOW (ref 11.6–15.9)
LYMPH#: 1.7 10*3/uL (ref 0.9–3.3)
LYMPH%: 24.2 % (ref 14.0–49.7)
MCH: 29.8 pg (ref 25.1–34.0)
MCHC: 32.3 g/dL (ref 31.5–36.0)
MCV: 92.2 fL (ref 79.5–101.0)
MONO#: 0.8 10*3/uL (ref 0.1–0.9)
MONO%: 10.9 % (ref 0.0–14.0)
NEUT%: 62.2 % (ref 38.4–76.8)
NEUTROS ABS: 4.3 10*3/uL (ref 1.5–6.5)
Platelets: 293 10*3/uL (ref 145–400)
RBC: 3.81 10*6/uL (ref 3.70–5.45)
RDW: 15.8 % — ABNORMAL HIGH (ref 11.2–14.5)
WBC: 6.9 10*3/uL (ref 3.9–10.3)

## 2015-01-02 LAB — COMPREHENSIVE METABOLIC PANEL (CC13)
ALT: 15 U/L (ref 0–55)
ANION GAP: 7 meq/L (ref 3–11)
AST: 22 U/L (ref 5–34)
Albumin: 3.1 g/dL — ABNORMAL LOW (ref 3.5–5.0)
Alkaline Phosphatase: 80 U/L (ref 40–150)
BUN: 10.6 mg/dL (ref 7.0–26.0)
CHLORIDE: 107 meq/L (ref 98–109)
CO2: 28 meq/L (ref 22–29)
Calcium: 9.4 mg/dL (ref 8.4–10.4)
Creatinine: 0.7 mg/dL (ref 0.6–1.1)
EGFR: 87 mL/min/{1.73_m2} — ABNORMAL LOW (ref >90)
Glucose: 110 mg/dl (ref 70–140)
Potassium: 4.5 mEq/L (ref 3.5–5.1)
Sodium: 141 mEq/L (ref 136–145)
TOTAL PROTEIN: 6.9 g/dL (ref 6.4–8.3)
Total Bilirubin: 0.34 mg/dL (ref 0.20–1.20)

## 2015-01-02 MED ORDER — DIPHENHYDRAMINE HCL 25 MG PO CAPS
25.0000 mg | ORAL_CAPSULE | Freq: Once | ORAL | Status: AC
  Administered 2015-01-02: 25 mg via ORAL

## 2015-01-02 MED ORDER — DIPHENHYDRAMINE HCL 25 MG PO CAPS
ORAL_CAPSULE | ORAL | Status: AC
  Filled 2015-01-02: qty 1

## 2015-01-02 MED ORDER — ACETAMINOPHEN 325 MG PO TABS
650.0000 mg | ORAL_TABLET | Freq: Once | ORAL | Status: AC
Start: 2015-01-02 — End: 2015-01-02
  Administered 2015-01-02: 650 mg via ORAL

## 2015-01-02 MED ORDER — SODIUM CHLORIDE 0.9 % IV SOLN
Freq: Once | INTRAVENOUS | Status: AC
  Administered 2015-01-02: 15:00:00 via INTRAVENOUS

## 2015-01-02 MED ORDER — SODIUM CHLORIDE 0.9 % IJ SOLN
10.0000 mL | INTRAMUSCULAR | Status: DC | PRN
  Administered 2015-01-02: 10 mL
  Filled 2015-01-02: qty 10

## 2015-01-02 MED ORDER — SODIUM CHLORIDE 0.9 % IV SOLN
3.6000 mg/kg | Freq: Once | INTRAVENOUS | Status: DC
  Filled 2015-01-02: qty 19

## 2015-01-02 MED ORDER — ACETAMINOPHEN 325 MG PO TABS
ORAL_TABLET | ORAL | Status: AC
  Filled 2015-01-02: qty 2

## 2015-01-02 MED ORDER — HEPARIN SOD (PORK) LOCK FLUSH 100 UNIT/ML IV SOLN
500.0000 [IU] | Freq: Once | INTRAVENOUS | Status: AC | PRN
  Administered 2015-01-02: 500 [IU]
  Filled 2015-01-02: qty 5

## 2015-01-02 MED ORDER — ADO-TRASTUZUMAB EMTANSINE CHEMO INJECTION 160 MG
3.6000 mg/kg | Freq: Once | INTRAVENOUS | Status: AC
  Administered 2015-01-02: 360 mg via INTRAVENOUS
  Filled 2015-01-02: qty 18

## 2015-01-02 NOTE — Patient Instructions (Addendum)
[facility] Discharge Instructions for Patients Receiving Chemotherapy  Today you received the following chemotherapy agents :  Kadcyla.  To help prevent nausea and vomiting after your treatment, we encourage you to take your nausea medication as prescribed.   If you develop nausea and vomiting that is not controlled by your nausea medication, call the clinic.   BELOW ARE SYMPTOMS THAT SHOULD BE REPORTED IMMEDIATELY:  *FEVER GREATER THAN 100.5 F  *CHILLS WITH OR WITHOUT FEVER  NAUSEA AND VOMITING THAT IS NOT CONTROLLED WITH YOUR NAUSEA MEDICATION  *UNUSUAL SHORTNESS OF BREATH  *UNUSUAL BRUISING OR BLEEDING  TENDERNESS IN MOUTH AND THROAT WITH OR WITHOUT PRESENCE OF ULCERS  *URINARY PROBLEMS  *BOWEL PROBLEMS  UNUSUAL RASH Items with * indicate a potential emergency and should be followed up as soon as possible.  Feel free to call the clinic you have any questions or concerns. The clinic phone number is [phone].  Please show the [facility] at check-in to the Emergency Department and triage nurse.  Ado-Trastuzumab Emtansine for injection What is this medicine? Ado-trastuzumab emtansine (ADD oh traz TOO zuh mab em TAN zine) is a monoclonal antibody combined with chemotherapy. It targets a protein called HER2. This protein is found in some stomach and breast cancers. This medicine can stop cancer cell growth. This medicine may be used for other purposes; ask your health care provider or pharmacist if you have questions. COMMON BRAND NAME(S): Kadcyla What should I tell my health care provider before I take this medicine? They need to know if you have any of these conditions: -heart disease -heart failure -infection (especially a virus infection such as chickenpox, cold sores, or herpes) -liver disease -lung or breathing disease, like asthma -an unusual or allergic reaction to ado-trastuzumab emtansine, other medications, foods, dyes, or  preservatives -pregnant or trying to get pregnant -breast-feeding How should I use this medicine? This medicine is for infusion into a vein. It is given by a health care professional in a hospital or clinic setting. Talk to your pediatrician regarding the use of this medicine in children. Special care may be needed. Overdosage: If you think you've taken too much of this medicine contact a poison control center or emergency room at once. Overdosage: If you think you have taken too much of this medicine contact a poison control center or emergency room at once. NOTE: This medicine is only for you. Do not share this medicine with others. What if I miss a dose? It is important not to miss your dose. Call your doctor or health care professional if you are unable to keep an appointment. What may interact with this medicine? This medicine may also interact with the following medications: -atazanavir -boceprevir -clarithromycin -delavirdine -indinavir -dalfopristin; quinupristin -isoniazid, INH -itraconazole -ketoconazole -nefazodone -nelfinavir -ritonavir -telaprevir -telithromycin -tipranavir -voriconazole This list may not describe all possible interactions. Give your health care provider a list of all the medicines, herbs, non-prescription drugs, or dietary supplements you use. Also tell them if you smoke, drink alcohol, or use illegal drugs. Some items may interact with your medicine. What should I watch for while using this medicine? Visit your doctor for checks on your progress. This drug may make you feel generally unwell. This is not uncommon, as chemotherapy can affect healthy cells as well as cancer cells. Report any side effects. Continue your course of treatment even though you feel ill unless your doctor tells you to stop. Call your doctor or health care professional for advice  if you get a fever, chills or sore throat, or other symptoms of a cold or flu. Do not treat yourself.  This drug decreases your body's ability to fight infections. Try to avoid being around people who are sick. Be careful brushing and flossing your teeth or using a toothpick because you may get an infection or bleed more easily. If you have any dental work done, tell your dentist you are receiving this medicine. Avoid taking products that contain aspirin, acetaminophen, ibuprofen, naproxen, or ketoprofen unless instructed by your doctor. These medicines may hide a fever. Do not become pregnant while taking this medicine. Women should inform their doctor if they wish to become pregnant or think they might be pregnant. There is a potential for serious side effects to an unborn child. [Men should inform their doctors if they wish to father a child. This medicine may lower sperm counts.] Talk to your health care professional or pharmacist for more information. Do not breast-feed an infant while taking this medicine. You may need blood work done while you are taking this medicine. What side effects may I notice from receiving this medicine? Side effects that you should report to your doctor or health care professional as soon as possible: -allergic reactions like skin rash, itching or hives, swelling of the face, lips, or tongue -breathing problems -chest pain or palpitations -fever or chills, sore throat -general ill feeling or flu-like symptoms -light-colored stools -nausea, vomiting -pain, tingling, numbness in the hands or feet -signs and symptoms of bleeding such as bloody or black, tarry stools; red or dark-brown urine; spitting up blood or brown material that looks like coffee grounds; red spots on the skin; unusual bruising or bleeding from the eye, gums, or nose -swelling of the legs or ankles -yellowing of the eyes or skin Side effects that usually do not require medical attention (Report these to your doctor or health care professional if they continue or are bothersome.): -changes in  taste -constipation -dizziness -headache -joint pain -muscle pain -trouble sleeping -unusually weak or tired This list may not describe all possible side effects. Call your doctor for medical advice about side effects. You may report side effects to FDA at 1-800-FDA-1088. Where should I keep my medicine? This drug is given in a hospital or clinic and will not be stored at home. NOTE: This sheet is a summary. It may not cover all possible information. If you have questions about this medicine, talk to your doctor, pharmacist, or health care provider.  2015, [employer]vier/Gold Standard. (2012-10-12 12:55:10)

## 2015-01-04 ENCOUNTER — Other Ambulatory Visit: Payer: Self-pay | Admitting: *Deleted

## 2015-01-05 ENCOUNTER — Encounter (HOSPITAL_COMMUNITY): Payer: Self-pay

## 2015-01-05 ENCOUNTER — Ambulatory Visit (HOSPITAL_COMMUNITY)
Admission: RE | Admit: 2015-01-05 | Discharge: 2015-01-05 | Disposition: A | Discharge status: Home / Self Care | Payer: Medicare Other | Source: Ambulatory Visit | Attending: Internal Medicine | Admitting: Internal Medicine

## 2015-01-05 VITALS — BP 108/70 | HR 107 | Wt 224.8 lb

## 2015-01-05 DIAGNOSIS — I319 Disease of pericardium, unspecified: Secondary | ICD-10-CM | POA: Diagnosis not present

## 2015-01-05 DIAGNOSIS — I313 Pericardial effusion (noninflammatory): Secondary | ICD-10-CM

## 2015-01-05 DIAGNOSIS — C7931 Secondary malignant neoplasm of brain: Secondary | ICD-10-CM | POA: Insufficient documentation

## 2015-01-05 DIAGNOSIS — J90 Pleural effusion, not elsewhere classified: Secondary | ICD-10-CM | POA: Insufficient documentation

## 2015-01-05 DIAGNOSIS — I5031 Acute diastolic (congestive) heart failure: Secondary | ICD-10-CM | POA: Diagnosis not present

## 2015-01-05 DIAGNOSIS — I3139 Other pericardial effusion (noninflammatory): Secondary | ICD-10-CM

## 2015-01-05 DIAGNOSIS — Z66 Do not resuscitate: Secondary | ICD-10-CM | POA: Insufficient documentation

## 2015-01-05 DIAGNOSIS — C50911 Malignant neoplasm of unspecified site of right female breast: Secondary | ICD-10-CM | POA: Diagnosis not present

## 2015-01-05 NOTE — Patient Instructions (Signed)
A chest x-ray takes a picture of the organs and structures inside the chest, including the heart, lungs, and blood vessels. This test can show several things, including, whether the heart is enlarges; whether fluid is building up in the lungs; and whether pacemaker / defibrillator leads are still in place.  Your physician has requested that you have an echocardiogram. Echocardiography is a painless test that uses sound waves to create images of your heart. It provides your doctor with information about the size and shape of your heart and how well your heart's chambers and valves are working. This procedure takes approximately one hour. There are no restrictions for this procedure.  Your physician recommends that you schedule a follow-up appointment in: 2 months

## 2015-01-05 NOTE — Progress Notes (Signed)
Patient ID: Linda [name], female   DOB: 10/08/1945, 69 y.o.   MRN: [id number]  Primary Physician: Linda Rio, PA-C Primary Cardiologist: Dr Linda [name]  HPI:  Linda [name] is a 69 year-old [name] woman with metastatic breast CA status post bilateral mastectomies 8/13. Post-mastectomy radiation completed 04/15/2012  She had cyclophosphamide and doxorubicin in dose dense fashion x4, to be followed by paclitaxel and trastuzumab again every 2 weeks x4, with the trastuzumab continued, but completed 11/01/2012.  Started anastrazole 04/16/2012; normal dexa scan 05/03/2012 at the [facility]. Still on anastrazole.  Hospitalized 09/10/2014>>09/12/2014 with pleural effusion, s/p thoracentesis w/ 900 cc out. CTA showed pericardial effusion. Effusion ([id number]) showed malignant cells which were HER-2 positive  Started trastuzumab/pertuzumab 09/26/2014, to be repeated every 21 days indefinitely.   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 Started whole brain radiation on 10/09/14. On Decadron.   Admitted 06/29>>7/8 w/massive pericardial effusion and volume overload. Underwent pericardial window (malingant) and thoracentesis. Diuresed about 40 pounds. Placed on Hospice care. Has since started salvage chemo RX. Here for f/u. Breathing much better. Weight has come down 4 more pounds. Using lasix 32m daily. (was at 433mbut dropped back). Gets dizzy at times  CXR 7/19: Small bilateral effusions L>R  Labs: 01/02/15 Creatinine 0.7  Potassium 4.5  ECHO 12/13/14 EF 60-65% large pericardial effusion with tamponade   Past Medical History  Diagnosis Date  . Cancer 08-13-11    07-31-11-Dx. Bilarteral Breast cancer-left greater than rt.  . Hematuria, undiagnosed cause 08-13-11    Being evaluated by [facility] 08-14-11  . Hypertension   . Bronchitis     hx  . GERD (gastroesophageal reflux disease)     doing well  . Breast  cancer 07/30/11 dx    Right  invasive ductal ca 7 0'clock,& left breast=invasive ductal ca  and dcis, left axilla nlymph node, metastatic ca  . Seroma 02/04/12    right breast  200cc removed  erythema on right side  . Seasonal allergies   . History of radiation therapy 02/20/12-04/15/12    left breast,total 60.4 Gy  . Wears partial dentures     wears upper and lower partial  . Allergy   . Blood transfusion without reported diagnosis   . 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   . Cancer of central portion of left female breast 08/01/2011  . S/P radiation therapy 10/09/14-10/27/14    whole brain 37.5Gy/1512f. Acute pulmonary embolism 12/13/2014  . Acute on chronic diastolic CHF (congestive heart failure)     Current Outpatient Prescriptions  Medication Sig Dispense Refill  . anastrozole (ARIMIDEX) 1 MG tablet TAKE 1 TABLET BY MOUTH DAILY. 30 tablet 5  . b complex vitamins tablet Take 1 tablet by mouth daily.    . cholecalciferol (VITAMIN D) 1000 UNITS tablet Take 1 tablet (1,000 Units total) by mouth daily. 100 tablet 12  . furosemide (LASIX) 20 MG tablet Take 1 tablet (20 mg total) by mouth every other day. 30 tablet 0  . magnesium oxide (MAG-OX) 400 (241.3 MG) MG tablet Take 1 tablet (400 mg total) by mouth daily. 30 tablet 0  . Multiple Vitamins-Iron (MULTIVITAMINS WITH IRON) TABS Take 1 tablet by mouth daily.    . nMarland Kitchenstatin cream (MYCOSTATIN) Apply topically 3 (three) times daily. 30 g 0  . oxyCODONE (OXY IR/ROXICODONE) 5 MG immediate release tablet Take 1-2 tablets (5-10 mg total) by mouth  every 4 (four) hours as needed for severe pain. 30 tablet 0  . potassium chloride SA (K-DUR,KLOR-CON) 20 MEQ tablet Take 2 tablets (40 mEq total) by mouth daily. 30 tablet 0  . tobramycin-dexamethasone (TOBRADEX) ophthalmic solution Place 1 drop into the left eye every 4 (four) hours while awake.    [name] ALPRAZolam (XANAX) 0.25 MG tablet Take 1 tablet (0.25 mg  total) by mouth 2 (two) times daily as needed for anxiety. (Patient not taking: Reported on 01/05/2015) 30 tablet 0  . benzonatate (TESSALON) 100 MG capsule Take 1 capsule (100 mg total) by mouth 3 (three) times daily as needed for cough. (Patient not taking: Reported on 01/05/2015) 20 capsule 2  . HYDROcodone-homatropine (HYCODAN) 5-1.5 MG/5ML syrup Take 5 mLs by mouth every 6 (six) hours as needed for cough. (Patient not taking: Reported on 01/05/2015) 240 mL 0  . lidocaine-prilocaine (EMLA) cream Apply 1 application topically as needed. (Patient not taking: Reported on 01/05/2015) 30 g 1  . traMADol (ULTRAM) 50 MG tablet Take 1-2 tablets (50-100 mg total) by mouth every 6 (six) hours as needed (mild pain). (Patient not taking: Reported on 01/05/2015) 30 tablet 0  . zolpidem (AMBIEN) 5 MG tablet Take 1 tablet (5 mg total) by mouth at bedtime as needed for sleep. (Patient not taking: Reported on 01/05/2015) 30 tablet 0   No current facility-administered medications for this encounter.    Allergies  Allergen Reactions  . Ace Inhibitors Cough      History   Social History  . Marital Status: Single    Spouse Name: N/A  . Number of Children: 3  . Years of Education: N/A   Occupational History  . Retired    Social History Main Topics  . Smoking status: Never Smoker   . Smokeless tobacco: Never Used  . Alcohol Use: 0.0 oz/week    0 Standard drinks or equivalent per week     Comment: rare- occ.  . Drug Use: No  . Sexual Activity: No     Comment: gxp3, 1st child age 59, menopause mid 69's,no hrt   Other Topics Concern  . Not on file   Social History Narrative   Lives with son.  Single.  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 She moved to [place] about 2 years ago but has a Games developer in [place]. Son Gerald Stabs lives in [place] and works as a Airline pilot. His wife is a Marine scientist. Son Shanon Brow  lives at [employer] and is a Advertising account executive in addition to having the [employer]. The patient attends a local [place] here.      Family History  Problem Relation Age of Onset  . Heart disease Mother   . Cancer Mother 73    breast, , 51 deceased  . Heart attack Father   . Cancer Sister 47    breast  . Colon cancer Neg Hx     Filed Vitals:   01/05/15 0939  BP: 108/70  Pulse: 107  Weight: 224 lb 12.8 oz (101.969 kg)  SpO2: 95%    PHYSICAL EXAM: General:  No respiratory difficulty HEENT: normal. alopecic Neck: supple. no JVD. Carotids 2+ bilat; no bruits. No lymphadenopathy or thryomegaly appreciated. Cor: PMI nondisplaced. Regular rate & rhythm. No rubs, gallops or murmurs. Lungs: clear. Decreased left base Abdomen: soft, nontender, nondistended. No hepatosplenomegaly. No bruits or masses. Good  bowel sounds. Extremities: no cyanosis, clubbing, rash, edema mild lymphedema in L arm Neuro: alert & oriented x 3, cranial nerves grossly intact. moves all 4 extremities w/o difficulty. Affect pleasa   ASSESSMENT & PLAN: 1. Metastatic/recurrent breast CA - hospice care 2. Chronic diastolic HF  3. Large hemorrhagic pericardial effusion with early tamponade  -s/p window 6/30 4. Pleural effusion 5. DNR/DNI - Now in hospice care.  Looks much better today. Volume status looks very good and perhaps even a bit low. Would use lasix only as needed for weight gain or swelling. CXR reviewed small bilateral pleural effusions L > R. Will repeat CXR in 2 weeks to reassess. If reaccumulating can consider Pleurex catheter as needed. Will also repeat echo at that time.   F/u with me in 2 months or sooner as needed.  Jaeliana Lococo,MD 10:55 AM

## 2015-01-05 NOTE — Addendum Note (Signed)
Encounter addended by: Scarlette Calico, RN on: 01/05/2015 11:02 AM<BR>     Documentation filed: Visit Diagnoses, Dx Association, Patient Instructions Section, Orders

## 2015-01-07 ENCOUNTER — Ambulatory Visit
Admission: RE | Admit: 2015-01-07 | Discharge: 2015-01-07 | Disposition: A | Discharge status: Home / Self Care | Payer: Medicare Other | Source: Ambulatory Visit | Attending: Oncology | Admitting: Oncology

## 2015-01-07 DIAGNOSIS — C50912 Malignant neoplasm of unspecified site of left female breast: Principal | ICD-10-CM

## 2015-01-07 DIAGNOSIS — C50911 Malignant neoplasm of unspecified site of right female breast: Secondary | ICD-10-CM

## 2015-01-07 DIAGNOSIS — C7931 Secondary malignant neoplasm of brain: Secondary | ICD-10-CM

## 2015-01-07 MED ORDER — GADOBENATE DIMEGLUMINE 529 MG/ML IV SOLN
20.0000 mL | Freq: Once | INTRAVENOUS | Status: AC | PRN
  Administered 2015-01-07: 20 mL via INTRAVENOUS

## 2015-01-09 ENCOUNTER — Ambulatory Visit: Payer: Self-pay

## 2015-01-09 ENCOUNTER — Other Ambulatory Visit: Payer: Self-pay

## 2015-01-12 ENCOUNTER — Other Ambulatory Visit (HOSPITAL_COMMUNITY): Payer: Self-pay | Admitting: *Deleted

## 2015-01-12 ENCOUNTER — Encounter: Payer: Self-pay | Admitting: *Deleted

## 2015-01-12 ENCOUNTER — Encounter (HOSPITAL_COMMUNITY): Payer: Self-pay

## 2015-01-12 MED ORDER — POTASSIUM CHLORIDE CRYS ER 20 MEQ PO TBCR: 40.0000 meq | EXTENDED_RELEASE_TABLET | Freq: Every day | ORAL | Status: DC

## 2015-01-15 ENCOUNTER — Telehealth (HOSPITAL_COMMUNITY): Payer: Self-pay | Admitting: Vascular Surgery

## 2015-01-15 NOTE — Telephone Encounter (Signed)
Pt called left message top confirm appt for tomorrow , Left pt a message to let her know she does not have a appt tomorrow.[name]

## 2015-01-16 ENCOUNTER — Encounter (HOSPITAL_COMMUNITY): Payer: Self-pay

## 2015-01-17 ENCOUNTER — Encounter: Payer: Self-pay | Admitting: Radiation Therapy

## 2015-01-17 NOTE — Progress Notes (Signed)
Dr. Ida Rogue note from 12/04/14 requested an MRI in 2 months, however, an MRI was done on 01/07/15, ordered by Med [facility]. The scan showed a dramatic positive response to treatment and No residual or new brain lesions visible. Per Dr. Lisbeth Renshaw, instead of getting a scan in [facility] August, we will get a scan 3 mo after the 7/24 scan.   Mont Dutton R.T. (R) (T) [facility] [place] [phone] Office [phone] Pager [phone] Fax [email or web address]

## 2015-01-22 ENCOUNTER — Other Ambulatory Visit (HOSPITAL_COMMUNITY): Payer: Self-pay

## 2015-01-23 ENCOUNTER — Other Ambulatory Visit (HOSPITAL_BASED_OUTPATIENT_CLINIC_OR_DEPARTMENT_OTHER): Payer: Medicare Other

## 2015-01-23 ENCOUNTER — Ambulatory Visit (HOSPITAL_BASED_OUTPATIENT_CLINIC_OR_DEPARTMENT_OTHER): Payer: Medicare Other

## 2015-01-23 ENCOUNTER — Telehealth: Payer: Self-pay | Admitting: Nurse Practitioner

## 2015-01-23 ENCOUNTER — Ambulatory Visit (HOSPITAL_BASED_OUTPATIENT_CLINIC_OR_DEPARTMENT_OTHER): Payer: Medicare Other | Admitting: Nurse Practitioner

## 2015-01-23 ENCOUNTER — Encounter: Payer: Self-pay | Admitting: Nurse Practitioner

## 2015-01-23 VITALS — BP 138/76 | HR 107 | Temp 98.0°F | Resp 18 | Ht 69.0 in | Wt 227.4 lb

## 2015-01-23 DIAGNOSIS — Z170 Estrogen receptor positive status [ER+]: Secondary | ICD-10-CM

## 2015-01-23 DIAGNOSIS — J90 Pleural effusion, not elsewhere classified: Secondary | ICD-10-CM | POA: Diagnosis not present

## 2015-01-23 DIAGNOSIS — C50912 Malignant neoplasm of unspecified site of left female breast: Secondary | ICD-10-CM

## 2015-01-23 DIAGNOSIS — I2699 Other pulmonary embolism without acute cor pulmonale: Secondary | ICD-10-CM | POA: Diagnosis not present

## 2015-01-23 DIAGNOSIS — C7931 Secondary malignant neoplasm of brain: Secondary | ICD-10-CM | POA: Diagnosis not present

## 2015-01-23 DIAGNOSIS — C7951 Secondary malignant neoplasm of bone: Secondary | ICD-10-CM

## 2015-01-23 DIAGNOSIS — C50911 Malignant neoplasm of unspecified site of right female breast: Secondary | ICD-10-CM

## 2015-01-23 DIAGNOSIS — J910 Malignant pleural effusion: Secondary | ICD-10-CM

## 2015-01-23 DIAGNOSIS — Z5112 Encounter for antineoplastic immunotherapy: Secondary | ICD-10-CM | POA: Diagnosis present

## 2015-01-23 DIAGNOSIS — R5383 Other fatigue: Secondary | ICD-10-CM | POA: Diagnosis not present

## 2015-01-23 LAB — CBC WITH DIFFERENTIAL/PLATELET
BASO%: 0.6 % (ref 0.0–2.0)
Basophils Absolute: 0 10*3/uL (ref 0.0–0.1)
EOS%: 1.8 % (ref 0.0–7.0)
Eosinophils Absolute: 0.1 10*3/uL (ref 0.0–0.5)
HCT: 36.1 % (ref 34.8–46.6)
HEMOGLOBIN: 11.9 g/dL (ref 11.6–15.9)
LYMPH%: 17.4 % (ref 14.0–49.7)
MCH: 29.7 pg (ref 25.1–34.0)
MCHC: 32.9 g/dL (ref 31.5–36.0)
MCV: 90.4 fL (ref 79.5–101.0)
MONO#: 0.6 10*3/uL (ref 0.1–0.9)
MONO%: 8 % (ref 0.0–14.0)
NEUT%: 72.2 % (ref 38.4–76.8)
NEUTROS ABS: 5.4 10*3/uL (ref 1.5–6.5)
PLATELETS: 258 10*3/uL (ref 145–400)
RBC: 4 10*6/uL (ref 3.70–5.45)
RDW: 15.3 % — ABNORMAL HIGH (ref 11.2–14.5)
WBC: 7.5 10*3/uL (ref 3.9–10.3)
lymph#: 1.3 10*3/uL (ref 0.9–3.3)

## 2015-01-23 LAB — COMPREHENSIVE METABOLIC PANEL (CC13)
ALK PHOS: 67 U/L (ref 40–150)
ALT: 18 U/L (ref 0–55)
AST: 22 U/L (ref 5–34)
Albumin: 3.2 g/dL — ABNORMAL LOW (ref 3.5–5.0)
Anion Gap: 7 mEq/L (ref 3–11)
BUN: 10 mg/dL (ref 7.0–26.0)
CHLORIDE: 110 meq/L — AB (ref 98–109)
CO2: 26 mEq/L (ref 22–29)
CREATININE: 0.7 mg/dL (ref 0.6–1.1)
Calcium: 9.3 mg/dL (ref 8.4–10.4)
EGFR: 87 mL/min/{1.73_m2} — ABNORMAL LOW (ref >90)
GLUCOSE: 157 mg/dL — AB (ref 70–140)
Potassium: 4.2 mEq/L (ref 3.5–5.1)
Sodium: 142 mEq/L (ref 136–145)
TOTAL PROTEIN: 7.1 g/dL (ref 6.4–8.3)
Total Bilirubin: 0.26 mg/dL (ref 0.20–1.20)

## 2015-01-23 MED ORDER — HEPARIN SOD (PORK) LOCK FLUSH 100 UNIT/ML IV SOLN
500.0000 [IU] | Freq: Once | INTRAVENOUS | Status: AC | PRN
  Administered 2015-01-23: 500 [IU]
  Filled 2015-01-23: qty 5

## 2015-01-23 MED ORDER — ACETAMINOPHEN 325 MG PO TABS
ORAL_TABLET | ORAL | Status: AC
  Filled 2015-01-23: qty 2

## 2015-01-23 MED ORDER — SODIUM CHLORIDE 0.9 % IV SOLN
3.5000 mg/kg | Freq: Once | INTRAVENOUS | Status: AC
  Administered 2015-01-23: 360 mg via INTRAVENOUS
  Filled 2015-01-23: qty 18

## 2015-01-23 MED ORDER — ACETAMINOPHEN 325 MG PO TABS
650.0000 mg | ORAL_TABLET | Freq: Once | ORAL | Status: AC
  Administered 2015-01-23: 650 mg via ORAL

## 2015-01-23 MED ORDER — DIPHENHYDRAMINE HCL 25 MG PO CAPS
25.0000 mg | ORAL_CAPSULE | Freq: Once | ORAL | Status: AC
  Administered 2015-01-23: 25 mg via ORAL

## 2015-01-23 MED ORDER — SODIUM CHLORIDE 0.9 % IJ SOLN
10.0000 mL | INTRAMUSCULAR | Status: DC | PRN
  Administered 2015-01-23: 10 mL
  Filled 2015-01-23: qty 10

## 2015-01-23 MED ORDER — DIPHENHYDRAMINE HCL 25 MG PO CAPS
ORAL_CAPSULE | ORAL | Status: AC
  Filled 2015-01-23: qty 2

## 2015-01-23 MED ORDER — SODIUM CHLORIDE 0.9 % IV SOLN
Freq: Once | INTRAVENOUS | Status: AC
  Administered 2015-01-23: 15:00:00 via INTRAVENOUS

## 2015-01-23 NOTE — Telephone Encounter (Signed)
Appointments made and avs printed for patient °

## 2015-01-23 NOTE — Patient Instructions (Signed)
[facility] Discharge Instructions for Patients Receiving Chemotherapy  Today you received the following chemotherapy agents Kadcyla.  To help prevent nausea and vomiting after your treatment, we encourage you to take your nausea medication.   If you develop nausea and vomiting that is not controlled by your nausea medication, call the clinic.   BELOW ARE SYMPTOMS THAT SHOULD BE REPORTED IMMEDIATELY:  *FEVER GREATER THAN 100.5 F  *CHILLS WITH OR WITHOUT FEVER  NAUSEA AND VOMITING THAT IS NOT CONTROLLED WITH YOUR NAUSEA MEDICATION  *UNUSUAL SHORTNESS OF BREATH  *UNUSUAL BRUISING OR BLEEDING  TENDERNESS IN MOUTH AND THROAT WITH OR WITHOUT PRESENCE OF ULCERS  *URINARY PROBLEMS  *BOWEL PROBLEMS  UNUSUAL RASH Items with * indicate a potential emergency and should be followed up as soon as possible.  Feel free to call the clinic you have any questions or concerns. The clinic phone number is [phone].  Please show the CHEMO ALERT CARD at check-in to the Emergency Department and triage nurse.   

## 2015-01-23 NOTE — Progress Notes (Signed)
ID: Wilma Flavin   DOB: [birth year]  MR#: [id number]  WIO#:[id number]  PCP: Leeanne Rio, PA-C GYN: SU: Coralie Keens OTHER MD: Crissie Reese, Linna Hoff Bensimhon  CHIEF COMPLAINT:  Bilateral Breast Cancers  CURRENT TREATMENT: Anastrozole  BREAST CANCER HISTORY: From the original intake note:  The patient noted a small amount of drainage from her left breast December of 2012. She brought it to her gynecologist's attention in January of 2013 and was set up for bilateral diagnostic mammography at the [facility] Center July 24, 2011. This was the patient's first ever mammogram. It showed a large irregular mass in the left retroareolar region extending to the nipple, with nipple retraction and skin thickening. This measured approximately 8.4 cm. It was associated with pleomorphic microcalcifications. By exam there was moderate distortion and retraction of the nipple with a large palpable ill-defined area in the retroareolar region. In the right right breast there was also a 2 cm hard mass palpated. Ultrasound of the right breast mass showed a complex cystic/solid area measuring 1.9 cm. Ultrasound of the right axilla was negative. Ultrasound of the left breast showed a large hypoechoic mass measuring at least 3.8 cm. The left axilla showed several adjacent abnormal appearing lymph nodes.  With this information biopsy of the right and left breast masses were obtained the same day, and showed ([id number])   (a) on the right, and invasive ductal carcinoma with papillary features, estrogen and progesterone receptor positive, HER-2 negative, with an MIB-1 of 10%.   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 The MIB-1 was 60% for the left-sided tumor.   With this information the patient was presented at the multidisciplinary breast cancer conference 08/06/2011. Subsequent evaluation and treatments  are as detailed below.  INTERVAL HISTORY: [name] returns today for follow up of her metastatic breast cancer, accompanied by a friend. She is due for cycle 2 of kadcyla today. She tolerated the first well with no reactions or major complications. She continues on anastrozole daily as well and has no side effects that she is aware of from that.  REVIEW OF SYSTEMS: [name] denies fevers, chills, nausea, vomiting, or changes in bowel or bladder habits. She has no pain. She denies shortness, cough, or palpitations. She has no chest pain any more. She takes lasix every 3 days and keeps track of her weight. Her main complaint is fatigue. Her appetite is fair, but she is drinking better. She denies headaches, dizziness, or weakness. A detailed review of systems is otherwise stable.  PAST MEDICAL HISTORY: Past Medical History  Diagnosis Date  . Cancer 08-13-11    07-31-11-Dx. Bilarteral Breast cancer-left greater than rt.  . Hematuria, undiagnosed cause 08-13-11    Being evaluated by [facility] 08-14-11  . Hypertension   . Bronchitis     hx  . GERD (gastroesophageal reflux disease)     doing well  . 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 Seroma 02/04/12    right breast  200cc removed  erythema on right side  . Seasonal allergies   . History of radiation therapy 02/20/12-04/15/12    left breast,total 60.4 Gy  . Wears partial dentures     wears upper and lower partial  . Allergy   . Blood transfusion without reported diagnosis   .  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   . Cancer of central portion of left female breast 08/01/2011  . S/P radiation therapy 10/09/14-10/27/14    whole brain 37.5Gy/76f  . Acute pulmonary embolism 12/13/2014  . Acute on chronic diastolic CHF (congestive heart failure)     PAST SURGICAL HISTORY: Past Surgical History   Procedure Laterality Date  . Child birth  08-13-11    x3 -NVD  . Portacath placement  08/15/2011    Procedure: INSERTION PORT-A-CATH;  Surgeon: DHarl Bowie MD;  Location: [facility];  Service: General;  Laterality: N/A;  . Mastectomy w/ sentinel node biopsy  01/21/2012    Procedure: MASTECTOMY WITH SENTINEL LYMPH NODE BIOPSY;  Surgeon: DHarl Bowie MD;  [place] [facility]  Service: General;  Laterality: Bilateral;  Left modified radical mastectomy, Rt mastectomy with Sentinel lymphnode biospy  . Breast surgery    . Port-a-cath removal Right 12/22/2012    Procedure: REMOVAL PORT-A-CATH;  Surgeon: DHarl Bowie MD;  [place] [facility]  Service: General;  Laterality: Right;  . Subxyphoid pericardial window N/A 12/14/2014    Procedure: SUBXYPHOID PERICARDIAL WINDOW;  Surgeon: CRexene Alberts MD;  [place] [facility]  Service: Thoracic;  Laterality: N/A;  . Tee without cardioversion N/A 12/14/2014    Procedure: TRANSESOPHAGEAL ECHOCARDIOGRAM (TEE);  Surgeon: CRexene Alberts MD;  [place] [facility]  Service: Thoracic;  Laterality: N/A;  . Chest tube insertion Left 12/14/2014    Procedure: CHEST TUBE INSERTION;  Surgeon: CRexene Alberts MD;  [facility];  Service: Thoracic;  Laterality: Left;    FAMILY HISTORY Family History  Problem Relation Age of Onset  . Heart disease Mother   . Cancer Mother 430   breast, , 780deceased  . Heart attack Father   . Cancer Sister 645   breast  . Colon cancer Neg Hx   The patient's father died from a heart attack at the age of 834 The patient's mother died from apparently heart problems at the age of 81 The patient had no brothers. She has 3 sisters. One of her sisters was diagnosed with breast cancer in her mid 652s The patient does not know if his sister was ever genetically tested. The patient's mother had a mastectomy at the age of 367 presumably for breast cancer. There is no other history of breast or in  cancer in the family to her knowledge.   GYNECOLOGIC HISTORY:  She does not recall when she had menarche. She had her first child at age 69 She is GX P3. She underwent menopause in her mid 453s She never took hormone replacement.   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 [place] a [place]Games developer[place] Son CGerald Stabslives [place] as a fAirline pilot His wife is a nMarine scientist Son DShanon Browlives at WB[employer]and is a dAdvertising account executivein addition to having the mTenne[employer] Inc The patient attends a local BLehman Brothershere   ADVANCED DIRECTIVES: Not in place; Not in place. At the clinic visit 01/23/2015 the patient was given the appropriate forms to complete and notarize at her discretion. She tells me she intends to name her daughter-in-law BJacqlyn Larsenas her healthcare power of attorney  HEALTH MAINTENANCE: History  Substance Use Topics  . Smoking status: Never Smoker   . Smokeless tobacco: Never Used  . Alcohol Use: 0.0 oz/week    0 Standard drinks or equivalent per week     Comment: rare- occ.     Colonoscopy: Never  PAP: Feb 2013  Bone density: November 2013, normal  Lipid panel:   Allergies  Allergen Reactions  . Ace Inhibitors Cough    Current Outpatient Prescriptions  Medication Sig Dispense Refill  . anastrozole (ARIMIDEX) 1 MG tablet TAKE 1 TABLET BY MOUTH DAILY. 30 tablet 5  . b complex vitamins tablet Take 1 tablet by mouth daily.    . cholecalciferol (VITAMIN D) 1000 UNITS tablet Take 1 tablet (1,000 Units total) by mouth daily. 100 tablet 12  . furosemide (LASIX) 20 MG tablet Take 1 tablet (20 mg total) by mouth every other day. (Patient taking differently: Take 20 mg by mouth every 3 (three) days. ) 30 tablet 0  . magnesium oxide (MAG-OX) 400 (241.3 MG) MG tablet Take 1 tablet (400 mg total) by  mouth daily. 30 tablet 0  . Multiple Vitamins-Iron (MULTIVITAMINS WITH IRON) TABS Take 1 tablet by mouth daily.    . potassium chloride SA (K-DUR,KLOR-CON) 20 MEQ tablet Take 2 tablets (40 mEq total) by mouth daily. 30 tablet 3  . ALPRAZolam (XANAX) 0.25 MG tablet Take 1 tablet (0.25 mg total) by mouth 2 (two) times daily as needed for anxiety. (Patient not taking: Reported on 01/05/2015) 30 tablet 0  . benzonatate (TESSALON) 100 MG capsule Take 1 capsule (100 mg total) by mouth 3 (three) times daily as needed for cough. (Patient not taking: Reported on 01/05/2015) 20 capsule 2  . HYDROcodone-homatropine (HYCODAN) 5-1.5 MG/5ML syrup Take 5 mLs by mouth every 6 (six) hours as needed for cough. (Patient not taking: Reported on 01/05/2015) 240 mL 0  . lidocaine-prilocaine (EMLA) cream Apply 1 application topically as needed. (Patient not taking: Reported on 01/05/2015) 30 g 1  . nystatin cream (MYCOSTATIN) Apply topically 3 (three) times daily. (Patient not taking: Reported on 01/23/2015) 30 g 0  . oxyCODONE (OXY IR/ROXICODONE) 5 MG immediate release tablet Take 1-2 tablets (5-10 mg total) by mouth every 4 (four) hours as needed for severe pain. (Patient not taking: Reported on 01/23/2015) 30 tablet 0  . tobramycin-dexamethasone (TOBRADEX) ophthalmic solution Place 1 drop into the left eye every 4 (four) hours while awake.    . traMADol (ULTRAM) 50 MG tablet Take 1-2 tablets (50-100 mg total) by mouth every 6 (six) hours as needed (mild pain). (Patient not taking: Reported on 01/05/2015) 30 tablet 0  . zolpidem (AMBIEN) 5 MG tablet Take 1 tablet (5 mg total) by mouth at bedtime as needed for sleep. (Patient not taking: Reported on 01/05/2015) 30 tablet 0   No current facility-administered medications for this visit.    OBJECTIVE: [name] who appears stated age 69 Vitals:   01/23/15 1328  BP: 138/76  Pulse: 107  Temp: 98 F (36.7 C)  Resp: 18     Body mass index is 33.57 kg/(m^2).     ECOG FS: 2 Filed Weights   01/23/15 1328  Weight: 227 lb 6.4 oz (103.148 kg)   Skin: warm, dry  HEENT: sclerae anicteric, conjunctivae pink, oropharynx clear. No thrush or mucositis.  Lymph Nodes: No cervical or supraclavicular lymphadenopathy  Lungs: clear to auscultation bilaterally, no rales, wheezes, or rhonci  Heart: regular rate and rhythm  Abdomen: round, soft, non tender, positive bowel sounds  Musculoskeletal: No focal spinal tenderness, grade 1 left upper extremity edema.  Neuro: non focal, well oriented, positive affect  Breasts: deferred   LAB RESULTS: Lab Results  Component Value Date   WBC 7.5 01/23/2015   NEUTROABS 5.4 01/23/2015   HGB 11.9 01/23/2015   HCT 36.1 01/23/2015   MCV 90.4 01/23/2015   PLT 258 01/23/2015      Chemistry      Component Value Date/Time   NA 142 01/23/2015 1318   NA 136 12/22/2014 0446   K 4.2 01/23/2015 1318   K 3.2* 12/22/2014 0446   CL 100* 12/22/2014 0446   CL 107 11/01/2012 1045   CO2 26 01/23/2015 1318   CO2 30 12/22/2014 0446   BUN 10.0 01/23/2015 1318   BUN <5* 12/22/2014 0446   CREATININE 0.7 01/23/2015 1318   CREATININE 0.67 12/22/2014 0446   CREATININE 0.61 06/30/2013 0959      Component Value Date/Time   CALCIUM 9.3 01/23/2015 1318   CALCIUM 8.2* 12/22/2014 0446   ALKPHOS 67 01/23/2015 1318   ALKPHOS 60 12/16/2014 0620   AST 22 01/23/2015 1318   AST 18 12/16/2014 0620   ALT 18 01/23/2015 1318   ALT 18 12/16/2014 0620   BILITOT 0.26 01/23/2015 1318   BILITOT 0.9 12/16/2014 0620       STUDIES: Dg Chest 2 View  01/01/2015   CLINICAL DATA:  Breast cancer.  EXAM: CHEST  2 VIEW  COMPARISON:  None.  FINDINGS: Power port catheter noted in good anatomic position. Mediastinum and hilar structures are normal. Heart size normal. Mild left base pleural parenchymal thickening consistent scarring. Bilateral small pleural effusions and/or pleural scarring noted. Degenerative changes thoracic spine.  IMPRESSION: 1. Power  port catheter noted in good anatomic position. 2. Basilar pleural parenchymal thickening consistent scarring. Bilateral small pleural effusions noted.   Electronically Signed   By: Marcello Moores  Register   On: 01/01/2015 13:56   Mr Jeri Cos Wo Contrast  01/07/2015   CLINICAL DATA:  Extensive intracranial metastatic disease diagnosed previously, treated with chemotherapy. Dizziness and nausea up. Breast cancer.  EXAM: MRI HEAD WITHOUT AND WITH CONTRAST  TECHNIQUE: Multiplanar, multiecho pulse sequences of the brain and surrounding structures were obtained without and with intravenous contrast.  CONTRAST:  72m MULTIHANCE GADOBENATE DIMEGLUMINE 529 MG/ML IV SOLN  COMPARISON:  09/25/2014  FINDINGS: There has been complete imaging response with respect to the innumerable enhancing lesions previously demonstrated scattered throughout the cerebellum and cerebral hemispheres. Today's study does not show any residual intracranial lesion. There are a few foci of residual signal abnormality in the calvarium, most notably in the left frontal in the right parietal region, but these are diminished in conspicuity. These may be scars, and can be followed on previous studies. A few old minor small vessel changes remain evident in the white matter. No cortical or large vessel territory infarction. No hemorrhage, hydrocephalus or extra-axial collection. Fluid is present throughout the mastoid air cells, much more extensive on the left than the right. Paranasal sinuses are clear. Major vessels at the base of the brain show flow.  IMPRESSION: Dramatic positive response to treatment. No residual or new brain lesions visible on today's study.  Few foci of signal disturbance in the calvarium that could represent residual disease or simply scarring.  Fluid throughout the mastoid air cells, more extensive on the left than the right.   Electronically Signed   By: MNelson ChimesM.D.   On: 01/07/2015 18:26  ASSESSMENT: 69 y.o.  [place]  woman   (1)  status post bilateral breast biopsies 07/24/2011, showing,      (a) on the right, a clinical T2 N0 papillary/ductal breast cancer, estrogen and progesterone receptor positive, HER-2 negative, with an MIB-1 of 10%;     (b) on the left, a clinical T3 N1, stage IIIA invasive ductal carcinoma, grade 3, triple positive, with an MIB-1 of 60%.  (2)  Status post 4 dose dense cycles of doxorubicin/ cyclophosphamide followed by 4 dose dense cycles of paclitaxel and trastuzumab completed 12/09/2011  (3) the trastuzumab was continued for a total of one year (to 11/01/2012). Final echo on 11/04/2012 showing a well preserved ejection fraction of 55-60%.  (4) s/p bilateral mastectomies 01/21/2012 showing   (a) on the Right, an 8 mm invasive papillary carcinoma, grade 1, ypT1b ypN0   (b) on the Left, multiple microscopic foci of residual  Invasive ductal carcinoma with evidence of dermal lymphatic involvement, pyT1a/T4 pyN0  (5)  Postmastectomy radiation, completed 04/15/2012  (6) Started anastrazole 04/16/2012; normal dexa scan 05/19/2014 at the [facility] 09/11/2014 (7) CT angiogram 09/11/2014 shows new left pericardial effusion and new mediastinal and hilar lymphadenopathy; there were no suspicious upper abdominal findings; Cytology from the left effusion 09/11/2014 ([id number]) shows malignant cells which are HER-2 positive  (8) Whole body bone scan on 09/25/14 showed metastatic foci throughout axial and appendicular skeleton. Areas of most potential concern include disease in the thoracic spine, disease in the right acetabulum, right superior pubic ramus and possibly proximal right femur, disease in the right humeral shaft and in both femoral shafts.  (8) Started trastuzumab/pertuzumab 09/26/2014, to be repeated every 21 days indefinitely  (a) echocardiogram 09/11/2014 shows a well preserved ejection fraction.  (9) Brain MRI on 09/25/14 was positive for numerous  small enhancing brain mets and calvarium/bone mets   (a) whole brain radiation on 10/09/14--10/27/2014  (10) started zolendronate 11/29/2014, to be repeated every 12 weeks  11) left pleural effusion re-tapped 12/08/2014 (a) left chest tube placement 12/14/2014  (12) echo 12/13/2014 showed large pericardial effusion (a) pericardial window placement 12/14/2014; fluid is hemorrhagic; path negative  (13) Right-sided pulmonary emboli noted on CT scan 12/13/2014  PLAN: Besides fatigue, Adara is doing well today. The labs were reviewed in detail and were stable. She will proceed with cycle 2 of kadcyla as planned today.   Nike will return in 3 week for labs and a follow up visit with Dr. Jana Hakim. She understands and agrees with this plan. She knows the goal of treatment in her case is control. She has been encouraged to call with any issues that might arise before her next visit here.   Laurie Panda, NP

## 2015-01-30 ENCOUNTER — Other Ambulatory Visit: Payer: Self-pay

## 2015-01-30 ENCOUNTER — Ambulatory Visit: Payer: Self-pay

## 2015-01-30 ENCOUNTER — Other Ambulatory Visit (HOSPITAL_COMMUNITY): Payer: Self-pay | Admitting: Internal Medicine

## 2015-01-30 ENCOUNTER — Ambulatory Visit: Payer: Self-pay | Admitting: Oncology

## 2015-01-30 ENCOUNTER — Ambulatory Visit (HOSPITAL_COMMUNITY): Payer: Medicare Other | Attending: Cardiovascular Disease

## 2015-01-30 DIAGNOSIS — I319 Disease of pericardium, unspecified: Secondary | ICD-10-CM | POA: Diagnosis not present

## 2015-01-30 DIAGNOSIS — I10 Essential (primary) hypertension: Secondary | ICD-10-CM | POA: Diagnosis not present

## 2015-01-30 DIAGNOSIS — I3139 Other pericardial effusion (noninflammatory): Secondary | ICD-10-CM

## 2015-01-30 DIAGNOSIS — I313 Pericardial effusion (noninflammatory): Secondary | ICD-10-CM | POA: Diagnosis not present

## 2015-01-30 DIAGNOSIS — I509 Heart failure, unspecified: Secondary | ICD-10-CM | POA: Insufficient documentation

## 2015-01-30 DIAGNOSIS — I5031 Acute diastolic (congestive) heart failure: Secondary | ICD-10-CM

## 2015-01-30 DIAGNOSIS — Z853 Personal history of malignant neoplasm of breast: Secondary | ICD-10-CM | POA: Insufficient documentation

## 2015-02-13 ENCOUNTER — Other Ambulatory Visit: Payer: Self-pay

## 2015-02-13 ENCOUNTER — Ambulatory Visit: Payer: Self-pay

## 2015-02-14 ENCOUNTER — Other Ambulatory Visit (HOSPITAL_BASED_OUTPATIENT_CLINIC_OR_DEPARTMENT_OTHER): Payer: Medicare Other

## 2015-02-14 ENCOUNTER — Telehealth: Payer: Self-pay | Admitting: Oncology

## 2015-02-14 ENCOUNTER — Ambulatory Visit (HOSPITAL_BASED_OUTPATIENT_CLINIC_OR_DEPARTMENT_OTHER): Payer: Medicare Other | Admitting: Oncology

## 2015-02-14 ENCOUNTER — Ambulatory Visit (HOSPITAL_BASED_OUTPATIENT_CLINIC_OR_DEPARTMENT_OTHER): Payer: Medicare Other

## 2015-02-14 ENCOUNTER — Other Ambulatory Visit: Payer: Self-pay | Admitting: Oncology

## 2015-02-14 VITALS — BP 145/80 | HR 91 | Temp 98.2°F | Resp 18 | Ht 69.0 in | Wt 225.5 lb

## 2015-02-14 DIAGNOSIS — I2699 Other pulmonary embolism without acute cor pulmonale: Secondary | ICD-10-CM | POA: Diagnosis not present

## 2015-02-14 DIAGNOSIS — C7931 Secondary malignant neoplasm of brain: Secondary | ICD-10-CM

## 2015-02-14 DIAGNOSIS — C50912 Malignant neoplasm of unspecified site of left female breast: Secondary | ICD-10-CM

## 2015-02-14 DIAGNOSIS — C50911 Malignant neoplasm of unspecified site of right female breast: Secondary | ICD-10-CM

## 2015-02-14 DIAGNOSIS — C7951 Secondary malignant neoplasm of bone: Secondary | ICD-10-CM

## 2015-02-14 DIAGNOSIS — I313 Pericardial effusion (noninflammatory): Secondary | ICD-10-CM

## 2015-02-14 DIAGNOSIS — R590 Localized enlarged lymph nodes: Secondary | ICD-10-CM | POA: Diagnosis not present

## 2015-02-14 DIAGNOSIS — Z5112 Encounter for antineoplastic immunotherapy: Secondary | ICD-10-CM | POA: Diagnosis present

## 2015-02-14 DIAGNOSIS — J910 Malignant pleural effusion: Secondary | ICD-10-CM

## 2015-02-14 DIAGNOSIS — C50919 Malignant neoplasm of unspecified site of unspecified female breast: Secondary | ICD-10-CM

## 2015-02-14 LAB — COMPREHENSIVE METABOLIC PANEL (CC13)
ALBUMIN: 3.4 g/dL — AB (ref 3.5–5.0)
ALT: 14 U/L (ref 0–55)
AST: 21 U/L (ref 5–34)
Alkaline Phosphatase: 73 U/L (ref 40–150)
Anion Gap: 6 mEq/L (ref 3–11)
BUN: 12.8 mg/dL (ref 7.0–26.0)
CO2: 30 meq/L — AB (ref 22–29)
Calcium: 10 mg/dL (ref 8.4–10.4)
Chloride: 106 mEq/L (ref 98–109)
Creatinine: 0.7 mg/dL (ref 0.6–1.1)
EGFR: 86 mL/min/{1.73_m2} — ABNORMAL LOW (ref >90)
GLUCOSE: 105 mg/dL (ref 70–140)
POTASSIUM: 4.3 meq/L (ref 3.5–5.1)
SODIUM: 143 meq/L (ref 136–145)
Total Bilirubin: 0.29 mg/dL (ref 0.20–1.20)
Total Protein: 7.4 g/dL (ref 6.4–8.3)

## 2015-02-14 LAB — CBC WITH DIFFERENTIAL/PLATELET
BASO%: 0.8 % (ref 0.0–2.0)
BASOS ABS: 0.1 10*3/uL (ref 0.0–0.1)
EOS%: 2.1 % (ref 0.0–7.0)
Eosinophils Absolute: 0.1 10*3/uL (ref 0.0–0.5)
HCT: 36.8 % (ref 34.8–46.6)
HGB: 12.2 g/dL (ref 11.6–15.9)
LYMPH%: 26 % (ref 14.0–49.7)
MCH: 29.7 pg (ref 25.1–34.0)
MCHC: 33.1 g/dL (ref 31.5–36.0)
MCV: 89.7 fL (ref 79.5–101.0)
MONO#: 0.7 10*3/uL (ref 0.1–0.9)
MONO%: 10.1 % (ref 0.0–14.0)
NEUT#: 4 10*3/uL (ref 1.5–6.5)
NEUT%: 61 % (ref 38.4–76.8)
Platelets: 265 10*3/uL (ref 145–400)
RBC: 4.1 10*6/uL (ref 3.70–5.45)
RDW: 16.4 % — ABNORMAL HIGH (ref 11.2–14.5)
WBC: 6.6 10*3/uL (ref 3.9–10.3)
lymph#: 1.7 10*3/uL (ref 0.9–3.3)

## 2015-02-14 MED ORDER — HEPARIN SOD (PORK) LOCK FLUSH 100 UNIT/ML IV SOLN
500.0000 [IU] | Freq: Once | INTRAVENOUS | Status: AC | PRN
  Administered 2015-02-14: 500 [IU]
  Filled 2015-02-14: qty 5

## 2015-02-14 MED ORDER — SODIUM CHLORIDE 0.9 % IV SOLN
Freq: Once | INTRAVENOUS | Status: AC
  Administered 2015-02-14: 15:00:00 via INTRAVENOUS

## 2015-02-14 MED ORDER — SODIUM CHLORIDE 0.9 % IV SOLN
3.5000 mg/kg | Freq: Once | INTRAVENOUS | Status: AC
  Administered 2015-02-14: 360 mg via INTRAVENOUS
  Filled 2015-02-14: qty 10

## 2015-02-14 MED ORDER — DIPHENHYDRAMINE HCL 25 MG PO CAPS
ORAL_CAPSULE | ORAL | Status: AC
  Filled 2015-02-14: qty 1

## 2015-02-14 MED ORDER — ACETAMINOPHEN 325 MG PO TABS
ORAL_TABLET | ORAL | Status: AC
Start: 2015-02-14 — End: 2015-02-14
  Filled 2015-02-14: qty 2

## 2015-02-14 MED ORDER — SODIUM CHLORIDE 0.9 % IJ SOLN
10.0000 mL | INTRAMUSCULAR | Status: DC | PRN
Start: 2015-02-14 — End: 2015-02-14
  Administered 2015-02-14: 10 mL
  Filled 2015-02-14: qty 10

## 2015-02-14 MED ORDER — ACETAMINOPHEN 325 MG PO TABS
650.0000 mg | ORAL_TABLET | Freq: Once | ORAL | Status: AC
Start: 2015-02-14 — End: 2015-02-14
  Administered 2015-02-14: 650 mg via ORAL

## 2015-02-14 MED ORDER — DIPHENHYDRAMINE HCL 25 MG PO CAPS
25.0000 mg | ORAL_CAPSULE | Freq: Once | ORAL | Status: AC
  Administered 2015-02-14: 25 mg via ORAL

## 2015-02-14 NOTE — Telephone Encounter (Signed)
Appointments made and avs printed for patient,email to dr Jana Hakim to order a new mri at [facility]

## 2015-02-14 NOTE — Patient Instructions (Signed)
[facility] Discharge Instructions for Patients Receiving Chemotherapy  Today you received the following chemotherapy agents Kadcyla.  To help prevent nausea and vomiting after your treatment, we encourage you to take your nausea medication.   If you develop nausea and vomiting that is not controlled by your nausea medication, call the clinic.   BELOW ARE SYMPTOMS THAT SHOULD BE REPORTED IMMEDIATELY:  *FEVER GREATER THAN 100.5 F  *CHILLS WITH OR WITHOUT FEVER  NAUSEA AND VOMITING THAT IS NOT CONTROLLED WITH YOUR NAUSEA MEDICATION  *UNUSUAL SHORTNESS OF BREATH  *UNUSUAL BRUISING OR BLEEDING  TENDERNESS IN MOUTH AND THROAT WITH OR WITHOUT PRESENCE OF ULCERS  *URINARY PROBLEMS  *BOWEL PROBLEMS  UNUSUAL RASH Items with * indicate a potential emergency and should be followed up as soon as possible.  Feel free to call the clinic you have any questions or concerns. The clinic phone number is [phone].  Please show the CHEMO ALERT CARD at check-in to the Emergency Department and triage nurse.   

## 2015-02-14 NOTE — Progress Notes (Signed)
ID: TAHJ LINDSETH   DOB: [birth year]  MR#: [id number]  WJX#:[id number]  PCP: Leeanne Rio, PA-C GYN: SU: Coralie Keens OTHER MD: Crissie Reese, Linna Hoff Bensimhon  CHIEF COMPLAINT:  Bilateral Breast Cancers  CURRENT TREATMENT: Anastrozole  BREAST CANCER HISTORY: From the original intake note:  The patient noted a small amount of drainage from her left breast December of 2012. She brought it to her gynecologist's attention in January of 2013 and was set up for bilateral diagnostic mammography at the [facility] Center July 24, 2011. This was the patient's first ever mammogram. It showed a large irregular mass in the left retroareolar region extending to the nipple, with nipple retraction and skin thickening. This measured approximately 8.4 cm. It was associated with pleomorphic microcalcifications. By exam there was moderate distortion and retraction of the nipple with a large palpable ill-defined area in the retroareolar region. In the right right breast there was also a 2 cm hard mass palpated. Ultrasound of the right breast mass showed a complex cystic/solid area measuring 1.9 cm. Ultrasound of the right axilla was negative. Ultrasound of the left breast showed a large hypoechoic mass measuring at least 3.8 cm. The left axilla showed several adjacent abnormal appearing lymph nodes.  With this information biopsy of the right and left breast masses were obtained the same day, and showed ([id number])   (a) on the right, and invasive ductal carcinoma with papillary features, estrogen and progesterone receptor positive, HER-2 negative, with an MIB-1 of 10%.   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 The MIB-1 was 60% for the left-sided tumor.   With this information the patient was presented at the multidisciplinary breast cancer conference 08/06/2011. Subsequent evaluation and treatments  are as detailed below.  INTERVAL HISTORY: Linda [name] returns today for follow up of her metastatic breast cancer, accompanied by her friend Butch Penny. Today is cycle 3 of T-DM 1, which she receives every 21 days. She just had a repeat echocardiogram with a very favorable ejection fraction. She is tolerating this treatment with no side effects that she is aware of.  REVIEW OF SYSTEMS: Linda [name] has had no unusual headaches, visual changes, nausea, vomiting, dizziness, or gait imbalance. She feels a little forgetful. She thought her visit here was yesterday and she actually came here before of finding out that it was today. She is doing a lot of housework, just visited one of her children who has a 20 step climbing to the back porch and did that a couple of times, and in general does her own activities of daily living. Of course she does not drive. Sometimes she takes a nap late mornings. Her ear drainage problems were cleared after her visit with Dr. Erik Obey. She denies cough, phlegm production, pleurisy, or worsening shortness of breath. A detailed review of systems today was otherwise negative  PAST MEDICAL HISTORY: Past Medical History  Diagnosis Date  . Cancer 08-13-11    07-31-11-Dx. Bilarteral Breast cancer-left greater than rt.  . Hematuria, undiagnosed cause 08-13-11    Being evaluated by [facility] 08-14-11  . Hypertension   . Bronchitis     hx  . GERD (gastroesophageal reflux disease)     doing well  . 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 Seroma 02/04/12    right  breast  200cc removed  erythema on right side  . Seasonal allergies   . History of radiation therapy 02/20/12-04/15/12    left breast,total 60.4 Gy  . Wears partial dentures     wears upper and lower partial  . Allergy   . Blood transfusion without reported diagnosis   . Radiation-induced dermatitis 03/26/2012    Using  radioplex cream, plus neosporin.   . Cancer of central portion of left female breast 08/01/2011  . S/P radiation therapy 10/09/14-10/27/14    whole brain 37.5Gy/21f  . Acute pulmonary embolism 12/13/2014  . Acute on chronic diastolic CHF (congestive heart failure)     PAST SURGICAL HISTORY: Past Surgical History  Procedure Laterality Date  . Child birth  08-13-11    x3 -NVD  . Portacath placement  08/15/2011    Procedure: INSERTION PORT-A-CATH;  Surgeon: DHarl Bowie MD;  Location: [facility];  Service: General;  Laterality: N/A;  . Mastectomy w/ sentinel node biopsy  01/21/2012    Procedure: MASTECTOMY WITH SENTINEL LYMPH NODE BIOPSY;  Surgeon: DHarl Bowie MD;  [place] [facility]  Service: General;  Laterality: Bilateral;  Left modified radical mastectomy, Rt mastectomy with Sentinel lymphnode biospy  . Breast surgery    . Port-a-cath removal Right 12/22/2012    Procedure: REMOVAL PORT-A-CATH;  Surgeon: DHarl Bowie MD;  [facility]  Service: General;  Laterality: Right;  . Subxyphoid pericardial window N/A 12/14/2014    Procedure: SUBXYPHOID PERICARDIAL WINDOW;  Surgeon: CRexene Alberts MD;  [facility] [facility]  Service: Thoracic;  Laterality: N/A;  . Tee without cardioversion N/A 12/14/2014    Procedure: TRANSESOPHAGEAL ECHOCARDIOGRAM (TEE);  Surgeon: CRexene Alberts MD;  [facility] [facility]  Service: Thoracic;  Laterality: N/A;  . Chest tube insertion Left 12/14/2014    Procedure: CHEST TUBE INSERTION;  Surgeon: CRexene Alberts MD;  [facility];  Service: Thoracic;  Laterality: Left;    FAMILY HISTORY Family History  Problem Relation Age of Onset  . Heart disease Mother   . Cancer Mother 68   breast, , 764deceased  . Heart attack Father   . Cancer Sister 640   breast  . Colon cancer Neg Hx   The patient's father died from a heart attack at the age of 846 The patient's mother died from apparently heart problems at the age of  68 The patient had no brothers. She has 3 sisters. One of her sisters was diagnosed with breast cancer in her mid 68s The patient does not know if his sister was ever genetically tested. The patient's mother had a mastectomy at the age of 358 presumably for breast cancer. There is no other history of breast or in cancer in the family to her knowledge.   GYNECOLOGIC HISTORY:  She does not recall when she had menarche. She had her first child at age 69 She is GX P3. She underwent menopause in her mid 417s She never took hormone replacement.   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 [place] has a cGames developerin [place] Son CGerald Stabslives [place] works as a fAirline pilot His wife is a nMarine scientist Son DShanon Browlives at [employer]and is a dAdvertising account executivein addition to having the mTenne[employer] Inc The  patient attends a local [employer] here   ADVANCED DIRECTIVES: Not in place. At the clinic visit 02/14/2015 the patient was given the appropriate forms to complete and notarize at her discretion. She tells me she intends to name her daughter-in-law Jacqlyn Larsen as her healthcare power of attorney  HEALTH MAINTENANCE: Social History  Substance Use Topics  . Smoking status: Never Smoker   . Smokeless tobacco: Never Used  . Alcohol Use: 0.0 oz/week    0 Standard drinks or equivalent per week     Comment: rare- occ.     Colonoscopy: Never  PAP: Feb 2013  Bone density: November 2013, normal  Lipid panel:   Allergies  Allergen Reactions  . Ace Inhibitors Cough    Current Outpatient Prescriptions  Medication Sig Dispense Refill  . ALPRAZolam (XANAX) 0.25 MG tablet Take 1 tablet (0.25 mg total) by mouth 2 (two) times daily as needed for anxiety. (Patient not taking: Reported on 01/05/2015) 30 tablet 0  . anastrozole (ARIMIDEX) 1 MG  tablet TAKE 1 TABLET BY MOUTH DAILY. 30 tablet 5  . b complex vitamins tablet Take 1 tablet by mouth daily.    . benzonatate (TESSALON) 100 MG capsule Take 1 capsule (100 mg total) by mouth 3 (three) times daily as needed for cough. (Patient not taking: Reported on 01/05/2015) 20 capsule 2  . cholecalciferol (VITAMIN D) 1000 UNITS tablet Take 1 tablet (1,000 Units total) by mouth daily. 100 tablet 12  . furosemide (LASIX) 20 MG tablet Take 1 tablet (20 mg total) by mouth every other day. (Patient taking differently: Take 20 mg by mouth every 3 (three) days. ) 30 tablet 0  . HYDROcodone-homatropine (HYCODAN) 5-1.5 MG/5ML syrup Take 5 mLs by mouth every 6 (six) hours as needed for cough. (Patient not taking: Reported on 01/05/2015) 240 mL 0  . lidocaine-prilocaine (EMLA) cream Apply 1 application topically as needed. (Patient not taking: Reported on 01/05/2015) 30 g 1  . magnesium oxide (MAG-OX) 400 (241.3 MG) MG tablet Take 1 tablet (400 mg total) by mouth daily. 30 tablet 0  . Multiple Vitamins-Iron (MULTIVITAMINS WITH IRON) TABS Take 1 tablet by mouth daily.    [name] nystatin cream (MYCOSTATIN) Apply topically 3 (three) times daily. (Patient not taking: Reported on 01/23/2015) 30 g 0  . oxyCODONE (OXY IR/ROXICODONE) 5 MG immediate release tablet Take 1-2 tablets (5-10 mg total) by mouth every 4 (four) hours as needed for severe pain. (Patient not taking: Reported on 01/23/2015) 30 tablet 0  . potassium chloride SA (K-DUR,KLOR-CON) 20 MEQ tablet Take 2 tablets (40 mEq total) by mouth daily. 30 tablet 3  . tobramycin-dexamethasone (TOBRADEX) ophthalmic solution Place 1 drop into the left eye every 4 (four) hours while awake.    . traMADol (ULTRAM) 50 MG tablet Take 1-2 tablets (50-100 mg total) by mouth every 6 (six) hours as needed (mild pain). (Patient not taking: Reported on 01/05/2015) 30 tablet 0  . zolpidem (AMBIEN) 5 MG tablet Take 1 tablet (5 mg total) by mouth at bedtime as needed for sleep. (Patient not  taking: Reported on 01/05/2015) 30 tablet 0   No current facility-administered medications for this visit.    OBJECTIVE: [name] in no acute distress Filed Vitals:   02/14/15 1339  BP: 145/80  Pulse: 91  Temp: 98.2 F (36.8 C)  Resp: 18     Body mass index is 33.29 kg/(m^2).    ECOG FS: 1 Filed Weights   02/14/15 1339  Weight: 225 lb 8  oz (102.286 kg)   Sclerae unicteric, pupils round and equal Oropharynx clear and moist-- no thrush or other lesions No cervical or supraclavicular adenopathy Lungs no rales or rhonchi Heart regular rate and rhythm Abd soft, nontender, positive bowel sounds MSK no focal spinal tenderness, no upper extremity lymphedema Neuro: nonfocal, well oriented, appropriate affect Breasts: Deferred     LAB RESULTS: Lab Results  Component Value Date   WBC 6.6 02/14/2015   NEUTROABS 4.0 02/14/2015   HGB 12.2 02/14/2015   HCT 36.8 02/14/2015   MCV 89.7 02/14/2015   PLT 265 02/14/2015      Chemistry      Component Value Date/Time   NA 143 02/14/2015 1302   NA 136 12/22/2014 0446   K 4.3 02/14/2015 1302   K 3.2* 12/22/2014 0446   CL 100* 12/22/2014 0446   CL 107 11/01/2012 1045   CO2 30* 02/14/2015 1302   CO2 30 12/22/2014 0446   BUN 12.8 02/14/2015 1302   BUN <5* 12/22/2014 0446   CREATININE 0.7 02/14/2015 1302   CREATININE 0.67 12/22/2014 0446   CREATININE 0.61 06/30/2013 0959      Component Value Date/Time   CALCIUM 10.0 02/14/2015 1302   CALCIUM 8.2* 12/22/2014 0446   ALKPHOS 73 02/14/2015 1302   ALKPHOS 60 12/16/2014 0620   AST 21 02/14/2015 1302   AST 18 12/16/2014 0620   ALT 14 02/14/2015 1302   ALT 18 12/16/2014 0620   BILITOT 0.29 02/14/2015 1302   BILITOT 0.9 12/16/2014 0620       STUDIES: Transthoracic Echocardiography  Patient:  Ahlaya, Ende MR #:    [id number] Study Date: 01/30/2015 Gender:   F Age:    69 Height:   175.3 cm Weight:   103 kg BSA:    2.27 m^2 Pt.  Status: Room:  SONOGRAPHER Oletta Lamas, Will ORDERING   Bensimhon, Daniel REFERRING  Bensimhon, Daniel PERFORMING  [facility], Outpatient ATTENDING  Skeet Latch, MD  cc:  ------------------------------------------------------------------- LV EF: 60% -  65%   ASSESSMENT: 69 y.o.  [place] woman   (1)  status post bilateral breast biopsies 07/24/2011, showing,      (a) on the right, a clinical T2 N0 papillary/ductal breast cancer, estrogen and progesterone receptor positive, HER-2 negative, with an MIB-1 of 10%;     (b) on the left, a clinical T3 N1, stage IIIA invasive ductal carcinoma, grade 3, triple positive, with an MIB-1 of 60%.  (2)  Status post 4 dose dense cycles of doxorubicin/ cyclophosphamide followed by 4 dose dense cycles of paclitaxel and trastuzumab completed 12/09/2011  (3) the trastuzumab was continued for a total of one year (to 11/01/2012). Final echo on 11/04/2012 showing a well preserved ejection fraction of 55-60%.  (4) s/p bilateral mastectomies 01/21/2012 showing   (a) on the Right, an 8 mm invasive papillary carcinoma, grade 1, ypT1b ypN0   (b) on the Left, multiple microscopic foci of residual  Invasive ductal carcinoma with evidence of dermal lymphatic involvement, pyT1a/T4 pyN0  (5)  Postmastectomy radiation, completed 04/15/2012  (6) Started anastrazole 04/16/2012; normal dexa scan 05/19/2014 [facility] 09/11/2014: brain, bones, left effusion (7) CT angiogram 09/11/2014 shows new left pericardial effusion and new mediastinal and hilar lymphadenopathy; there were no suspicious upper abdominal findings; Cytology from the left effusion 09/11/2014 ([id number]) shows malignant cells which are HER-2 positive  (8) Whole body bone scan on 09/25/14 showed metastatic foci throughout axial and appendicular skeleton. Areas of most potential concern  include disease in the thoracic spine, disease in the right acetabulum,  right superior pubic ramus and possibly proximal right femur, disease in the right humeral shaft and in both femoral shafts.  (8) Started trastuzumab/pertuzumab 09/26/2014, discontinued after 11/29/2014 dose, w progression  (a) T-DM1 started 01/02/2015  (b) echocardiogram 01/30/2015 shows a well preserved ejection fraction.  (9) Brain MRI on 09/25/14 was positive for numerous small enhancing brain mets and calvarium/bone mets   (a) whole brain radiation on 10/09/14--10/27/2014  (b) brain MRI 01/07/2015 shows a complete clinical response  (10) started zolendronate 11/29/2014, to be repeated every 12 weeks  11) left pleural effusion re-tapped 12/08/2014 (a) left chest tube placement 12/14/2014  (12) large pericardial effusion per echo 12/13/2014  (a) pericardial window placement 12/14/2014; fluid is hemorrhagic; path negative  (13) Right-sided pulmonary emboli noted on CT scan 12/13/2014  PLAN: Linda [name] is doing remarkably well. She is tolerating the T-the M1 without any side effects that she is aware of. A repeat echocardiogram shows a well preserved ejection fraction. She will be receiving that today and every 3 weeks, and she will receive zolendronate with her next dose, mid-September.  A repeat brain MRI in July was very favorable. We will repeat that in early October and she will be seen by radiation oncology shortly after that.  She is due for zolendronate mid-September.  I am setting her up for a CT scan of the chest before she sees me mid October to assess her pulmonary embolus, mediastinal adenopathy, and effusion. I am hoping to see some evidence of response  Linda [name] knows to call for any problems that may develop before her next visit here.   Chauncey Cruel, MD

## 2015-02-28 ENCOUNTER — Telehealth: Payer: Self-pay | Admitting: Oncology

## 2015-02-28 NOTE — Telephone Encounter (Signed)
Called and left a message that dr Jana Hakim has now placed the order for mri and it is now ready for her to call and schedule

## 2015-03-07 ENCOUNTER — Other Ambulatory Visit (HOSPITAL_BASED_OUTPATIENT_CLINIC_OR_DEPARTMENT_OTHER): Payer: Medicare Other

## 2015-03-07 ENCOUNTER — Ambulatory Visit (HOSPITAL_BASED_OUTPATIENT_CLINIC_OR_DEPARTMENT_OTHER): Payer: Medicare Other

## 2015-03-07 ENCOUNTER — Other Ambulatory Visit: Payer: Self-pay | Admitting: Oncology

## 2015-03-07 VITALS — BP 142/82 | HR 79 | Temp 97.7°F | Resp 20

## 2015-03-07 DIAGNOSIS — C7931 Secondary malignant neoplasm of brain: Secondary | ICD-10-CM | POA: Diagnosis not present

## 2015-03-07 DIAGNOSIS — C50911 Malignant neoplasm of unspecified site of right female breast: Secondary | ICD-10-CM

## 2015-03-07 DIAGNOSIS — C7951 Secondary malignant neoplasm of bone: Secondary | ICD-10-CM

## 2015-03-07 DIAGNOSIS — C50912 Malignant neoplasm of unspecified site of left female breast: Secondary | ICD-10-CM

## 2015-03-07 DIAGNOSIS — J910 Malignant pleural effusion: Secondary | ICD-10-CM

## 2015-03-07 DIAGNOSIS — Z5111 Encounter for antineoplastic chemotherapy: Secondary | ICD-10-CM | POA: Diagnosis present

## 2015-03-07 LAB — CBC WITH DIFFERENTIAL/PLATELET
BASO%: 0.8 % (ref 0.0–2.0)
Basophils Absolute: 0.1 10*3/uL (ref 0.0–0.1)
EOS%: 1.6 % (ref 0.0–7.0)
Eosinophils Absolute: 0.1 10*3/uL (ref 0.0–0.5)
HCT: 36.5 % (ref 34.8–46.6)
HGB: 12.2 g/dL (ref 11.6–15.9)
LYMPH%: 24.3 % (ref 14.0–49.7)
MCH: 29.7 pg (ref 25.1–34.0)
MCHC: 33.5 g/dL (ref 31.5–36.0)
MCV: 88.6 fL (ref 79.5–101.0)
MONO#: 0.7 10*3/uL (ref 0.1–0.9)
MONO%: 10 % (ref 0.0–14.0)
NEUT#: 4.6 10*3/uL (ref 1.5–6.5)
NEUT%: 63.3 % (ref 38.4–76.8)
Platelets: 254 10*3/uL (ref 145–400)
RBC: 4.12 10*6/uL (ref 3.70–5.45)
RDW: 16.1 % — ABNORMAL HIGH (ref 11.2–14.5)
WBC: 7.3 10*3/uL (ref 3.9–10.3)
lymph#: 1.8 10*3/uL (ref 0.9–3.3)

## 2015-03-07 LAB — COMPREHENSIVE METABOLIC PANEL (CC13)
ALT: 17 U/L (ref 0–55)
ANION GAP: 8 meq/L (ref 3–11)
AST: 22 U/L (ref 5–34)
Albumin: 3.6 g/dL (ref 3.5–5.0)
Alkaline Phosphatase: 71 U/L (ref 40–150)
BUN: 11.6 mg/dL (ref 7.0–26.0)
CHLORIDE: 107 meq/L (ref 98–109)
CO2: 27 mEq/L (ref 22–29)
CREATININE: 0.7 mg/dL (ref 0.6–1.1)
Calcium: 10 mg/dL (ref 8.4–10.4)
EGFR: 84 mL/min/{1.73_m2} — ABNORMAL LOW (ref >90)
Glucose: 117 mg/dl (ref 70–140)
Potassium: 4.1 mEq/L (ref 3.5–5.1)
SODIUM: 141 meq/L (ref 136–145)
Total Bilirubin: 0.44 mg/dL (ref 0.20–1.20)
Total Protein: 7.7 g/dL (ref 6.4–8.3)

## 2015-03-07 MED ORDER — DIPHENHYDRAMINE HCL 25 MG PO CAPS
ORAL_CAPSULE | ORAL | Status: AC
  Filled 2015-03-07: qty 1

## 2015-03-07 MED ORDER — SODIUM CHLORIDE 0.9 % IV SOLN
3.4000 mg/kg | Freq: Once | INTRAVENOUS | Status: AC
  Administered 2015-03-07: 360 mg via INTRAVENOUS
  Filled 2015-03-07: qty 13

## 2015-03-07 MED ORDER — ACETAMINOPHEN 325 MG PO TABS
ORAL_TABLET | ORAL | Status: AC
Start: 2015-03-07 — End: 2015-03-07
  Filled 2015-03-07: qty 2

## 2015-03-07 MED ORDER — SODIUM CHLORIDE 0.9 % IJ SOLN
10.0000 mL | INTRAMUSCULAR | Status: DC | PRN
  Administered 2015-03-07: 10 mL
  Filled 2015-03-07: qty 10

## 2015-03-07 MED ORDER — HEPARIN SOD (PORK) LOCK FLUSH 100 UNIT/ML IV SOLN
500.0000 [IU] | Freq: Once | INTRAVENOUS | Status: AC | PRN
  Administered 2015-03-07: 500 [IU]
  Filled 2015-03-07: qty 5

## 2015-03-07 MED ORDER — DIPHENHYDRAMINE HCL 25 MG PO CAPS
25.0000 mg | ORAL_CAPSULE | Freq: Once | ORAL | Status: AC
  Administered 2015-03-07: 25 mg via ORAL

## 2015-03-07 MED ORDER — ZOLEDRONIC ACID 4 MG/100ML IV SOLN
4.0000 mg | Freq: Once | INTRAVENOUS | Status: AC
  Administered 2015-03-07: 4 mg via INTRAVENOUS
  Filled 2015-03-07: qty 100

## 2015-03-07 MED ORDER — ACETAMINOPHEN 325 MG PO TABS
650.0000 mg | ORAL_TABLET | Freq: Once | ORAL | Status: AC
  Administered 2015-03-07: 650 mg via ORAL

## 2015-03-07 MED ORDER — SODIUM CHLORIDE 0.9 % IV SOLN
Freq: Once | INTRAVENOUS | Status: AC
  Administered 2015-03-07: 13:00:00 via INTRAVENOUS

## 2015-03-07 NOTE — Patient Instructions (Signed)
[facility] Discharge Instructions for Patients Receiving Chemotherapy  Today you received the following chemotherapy agents: Kadcyla.  To help prevent nausea and vomiting after your treatment, we encourage you to take your nausea medication.   If you develop nausea and vomiting that is not controlled by your nausea medication, call the clinic.   BELOW ARE SYMPTOMS THAT SHOULD BE REPORTED IMMEDIATELY:  *FEVER GREATER THAN 100.5 F  *CHILLS WITH OR WITHOUT FEVER  NAUSEA AND VOMITING THAT IS NOT CONTROLLED WITH YOUR NAUSEA MEDICATION  *UNUSUAL SHORTNESS OF BREATH  *UNUSUAL BRUISING OR BLEEDING  TENDERNESS IN MOUTH AND THROAT WITH OR WITHOUT PRESENCE OF ULCERS  *URINARY PROBLEMS  *BOWEL PROBLEMS  UNUSUAL RASH Items with * indicate a potential emergency and should be followed up as soon as possible.  Feel free to call the clinic you have any questions or concerns. The clinic phone number is [phone].  Please show the [facility] at check-in to the Emergency Department and triage nurse.  Zoledronic Acid injection (Hypercalcemia, Oncology) What is this medicine? ZOLEDRONIC ACID (ZOE le dron ik AS id) lowers the amount of calcium loss from bone. It is used to treat too much calcium in your blood from cancer. It is also used to prevent complications of cancer that has spread to the bone. This medicine may be used for other purposes; ask your health care provider or pharmacist if you have questions. COMMON BRAND NAME(S): Zometa What should I tell my health care provider before I take this medicine? They need to know if you have any of these conditions: -aspirin-sensitive asthma -cancer, especially if you are receiving medicines used to treat cancer -dental disease or wear dentures -infection -kidney disease -receiving corticosteroids like dexamethasone or prednisone -an unusual or allergic reaction to zoledronic acid, other medicines, foods, dyes, or  preservatives -pregnant or trying to get pregnant -breast-feeding How should I use this medicine? This medicine is for infusion into a vein. It is given by a health care professional in a hospital or clinic setting. Talk to your pediatrician regarding the use of this medicine in children. Special care may be needed. Overdosage: If you think you have taken too much of this medicine contact a poison control center or emergency room at once. NOTE: This medicine is only for you. Do not share this medicine with others. What if I miss a dose? It is important not to miss your dose. Call your doctor or health care professional if you are unable to keep an appointment. What may interact with this medicine? -certain antibiotics given by injection -NSAIDs, medicines for pain and inflammation, like ibuprofen or naproxen -some diuretics like bumetanide, furosemide -teriparatide -thalidomide This list may not describe all possible interactions. Give your health care provider a list of all the medicines, herbs, non-prescription drugs, or dietary supplements you use. Also tell them if you smoke, drink alcohol, or use illegal drugs. Some items may interact with your medicine. What should I watch for while using this medicine? Visit your doctor or health care professional for regular checkups. It may be some time before you see the benefit from this medicine. Do not stop taking your medicine unless your doctor tells you to. Your doctor may order blood tests or other tests to see how you are doing. Women should inform their doctor if they wish to become pregnant or think they might be pregnant. There is a potential for serious side effects to an unborn child. Talk to your health care  professional or pharmacist for more information. You should make sure that you get enough calcium and vitamin D while you are taking this medicine. Discuss the foods you eat and the vitamins you take with your health care  professional. Some people who take this medicine have severe bone, joint, and/or muscle pain. This medicine may also increase your risk for jaw problems or a broken thigh bone. Tell your doctor right away if you have severe pain in your jaw, bones, joints, or muscles. Tell your doctor if you have any pain that does not go away or that gets worse. Tell your dentist and dental surgeon that you are taking this medicine. You should not have major dental surgery while on this medicine. See your dentist to have a dental exam and fix any dental problems before starting this medicine. Take good care of your teeth while on this medicine. Make sure you see your dentist for regular follow-up appointments. What side effects may I notice from receiving this medicine? Side effects that you should report to your doctor or health care professional as soon as possible: -allergic reactions like skin rash, itching or hives, swelling of the face, lips, or tongue -anxiety, confusion, or depression -breathing problems -changes in vision -eye pain -feeling faint or lightheaded, falls -jaw pain, especially after dental work -mouth sores -muscle cramps, stiffness, or weakness -trouble passing urine or change in the amount of urine Side effects that usually do not require medical attention (report to your doctor or health care professional if they continue or are bothersome): -bone, joint, or muscle pain -constipation -diarrhea -fever -hair loss -irritation at site where injected -loss of appetite -nausea, vomiting -stomach upset -trouble sleeping -trouble swallowing -weak or tired This list may not describe all possible side effects. Call your doctor for medical advice about side effects. You may report side effects to FDA at 1-800-FDA-1088. Where should I keep my medicine? This drug is given in a hospital or clinic and will not be stored at home. NOTE: This sheet is a summary. It may not cover all possible  information. If you have questions about this medicine, talk to your doctor, pharmacist, or health care provider.  2015, Elsevier/Gold Standard. (2012-11-11 13:03:13)

## 2015-03-18 ENCOUNTER — Ambulatory Visit
Admission: RE | Admit: 2015-03-18 | Discharge: 2015-03-18 | Disposition: A | Discharge status: Home / Self Care | Payer: Medicare Other | Source: Ambulatory Visit | Attending: Oncology | Admitting: Oncology

## 2015-03-18 DIAGNOSIS — C50919 Malignant neoplasm of unspecified site of unspecified female breast: Secondary | ICD-10-CM

## 2015-03-18 MED ORDER — GADOBENATE DIMEGLUMINE 529 MG/ML IV SOLN
20.0000 mL | Freq: Once | INTRAVENOUS | Status: AC | PRN
  Administered 2015-03-18: 20 mL via INTRAVENOUS

## 2015-03-23 ENCOUNTER — Telehealth: Payer: Self-pay | Admitting: *Deleted

## 2015-03-23 NOTE — Telephone Encounter (Signed)
Pt called with complaints of not being able to sleep x 1 week. She wanted to know if she could have a Rx for Hydrocodone to help her sleep. Pt denies any pain. I advised her to take Benadryl 1-2 tablets at bedtime to help her to sleep. I also told her about melatonin as well. I told pt to give this a week or two, if it doesn't help, give [place] a call back. Pt agreed with the plan. Message to be fwd to Gentry Fitz, NP.

## 2015-03-28 ENCOUNTER — Ambulatory Visit (HOSPITAL_BASED_OUTPATIENT_CLINIC_OR_DEPARTMENT_OTHER): Payer: Medicare Other

## 2015-03-28 ENCOUNTER — Encounter: Payer: Self-pay | Admitting: Nurse Practitioner

## 2015-03-28 ENCOUNTER — Ambulatory Visit (HOSPITAL_BASED_OUTPATIENT_CLINIC_OR_DEPARTMENT_OTHER): Payer: Medicare Other | Admitting: Nurse Practitioner

## 2015-03-28 ENCOUNTER — Telehealth: Payer: Self-pay | Admitting: Nurse Practitioner

## 2015-03-28 ENCOUNTER — Other Ambulatory Visit (HOSPITAL_BASED_OUTPATIENT_CLINIC_OR_DEPARTMENT_OTHER): Payer: Medicare Other

## 2015-03-28 VITALS — BP 129/76 | HR 93 | Temp 98.2°F | Resp 18 | Ht 69.0 in | Wt 220.1 lb

## 2015-03-28 DIAGNOSIS — C50911 Malignant neoplasm of unspecified site of right female breast: Secondary | ICD-10-CM

## 2015-03-28 DIAGNOSIS — I2699 Other pulmonary embolism without acute cor pulmonale: Secondary | ICD-10-CM | POA: Diagnosis not present

## 2015-03-28 DIAGNOSIS — C50912 Malignant neoplasm of unspecified site of left female breast: Secondary | ICD-10-CM

## 2015-03-28 DIAGNOSIS — C7951 Secondary malignant neoplasm of bone: Secondary | ICD-10-CM | POA: Diagnosis not present

## 2015-03-28 DIAGNOSIS — I313 Pericardial effusion (noninflammatory): Secondary | ICD-10-CM

## 2015-03-28 DIAGNOSIS — Z170 Estrogen receptor positive status [ER+]: Secondary | ICD-10-CM | POA: Diagnosis not present

## 2015-03-28 DIAGNOSIS — J910 Malignant pleural effusion: Secondary | ICD-10-CM | POA: Diagnosis not present

## 2015-03-28 DIAGNOSIS — Z5112 Encounter for antineoplastic immunotherapy: Secondary | ICD-10-CM | POA: Diagnosis present

## 2015-03-28 DIAGNOSIS — C7931 Secondary malignant neoplasm of brain: Secondary | ICD-10-CM

## 2015-03-28 DIAGNOSIS — C50919 Malignant neoplasm of unspecified site of unspecified female breast: Secondary | ICD-10-CM

## 2015-03-28 DIAGNOSIS — C7800 Secondary malignant neoplasm of unspecified lung: Principal | ICD-10-CM

## 2015-03-28 DIAGNOSIS — G4700 Insomnia, unspecified: Secondary | ICD-10-CM

## 2015-03-28 LAB — COMPREHENSIVE METABOLIC PANEL (CC13)
ALBUMIN: 3.6 g/dL (ref 3.5–5.0)
ALK PHOS: 75 U/L (ref 40–150)
ALT: 14 U/L (ref 0–55)
ANION GAP: 8 meq/L (ref 3–11)
AST: 21 U/L (ref 5–34)
BUN: 11 mg/dL (ref 7.0–26.0)
CHLORIDE: 107 meq/L (ref 98–109)
CO2: 25 mEq/L (ref 22–29)
CREATININE: 0.7 mg/dL (ref 0.6–1.1)
Calcium: 9.6 mg/dL (ref 8.4–10.4)
EGFR: 84 mL/min/{1.73_m2} — ABNORMAL LOW (ref >90)
Glucose: 109 mg/dl (ref 70–140)
POTASSIUM: 3.9 meq/L (ref 3.5–5.1)
Sodium: 140 mEq/L (ref 136–145)
Total Bilirubin: 0.42 mg/dL (ref 0.20–1.20)
Total Protein: 7.7 g/dL (ref 6.4–8.3)

## 2015-03-28 LAB — CBC WITH DIFFERENTIAL/PLATELET
BASO%: 0.3 % (ref 0.0–2.0)
Basophils Absolute: 0 10*3/uL (ref 0.0–0.1)
EOS ABS: 0.1 10*3/uL (ref 0.0–0.5)
EOS%: 1.6 % (ref 0.0–7.0)
HEMATOCRIT: 37.3 % (ref 34.8–46.6)
HEMOGLOBIN: 12.2 g/dL (ref 11.6–15.9)
LYMPH#: 1.5 10*3/uL (ref 0.9–3.3)
LYMPH%: 21.8 % (ref 14.0–49.7)
MCH: 30.2 pg (ref 25.1–34.0)
MCHC: 32.7 g/dL (ref 31.5–36.0)
MCV: 92.3 fL (ref 79.5–101.0)
MONO#: 0.7 10*3/uL (ref 0.1–0.9)
MONO%: 10.6 % (ref 0.0–14.0)
NEUT#: 4.4 10*3/uL (ref 1.5–6.5)
NEUT%: 65.7 % (ref 38.4–76.8)
Platelets: 231 10*3/uL (ref 145–400)
RBC: 4.04 10*6/uL (ref 3.70–5.45)
RDW: 15.3 % — AB (ref 11.2–14.5)
WBC: 6.7 10*3/uL (ref 3.9–10.3)

## 2015-03-28 MED ORDER — DIPHENHYDRAMINE HCL 25 MG PO CAPS
ORAL_CAPSULE | ORAL | Status: AC
  Filled 2015-03-28: qty 1

## 2015-03-28 MED ORDER — SODIUM CHLORIDE 0.9 % IV SOLN
3.5000 mg/kg | Freq: Once | INTRAVENOUS | Status: AC
  Administered 2015-03-28: 360 mg via INTRAVENOUS
  Filled 2015-03-28: qty 8

## 2015-03-28 MED ORDER — ACETAMINOPHEN 325 MG PO TABS
650.0000 mg | ORAL_TABLET | Freq: Once | ORAL | Status: AC
  Administered 2015-03-28: 650 mg via ORAL

## 2015-03-28 MED ORDER — ACETAMINOPHEN 325 MG PO TABS
ORAL_TABLET | ORAL | Status: AC
  Filled 2015-03-28: qty 2

## 2015-03-28 MED ORDER — SODIUM CHLORIDE 0.9 % IV SOLN
Freq: Once | INTRAVENOUS | Status: AC
  Administered 2015-03-28: 14:00:00 via INTRAVENOUS

## 2015-03-28 MED ORDER — DIPHENHYDRAMINE HCL 25 MG PO CAPS
25.0000 mg | ORAL_CAPSULE | Freq: Once | ORAL | Status: AC
  Administered 2015-03-28: 25 mg via ORAL

## 2015-03-28 MED ORDER — HEPARIN SOD (PORK) LOCK FLUSH 100 UNIT/ML IV SOLN
500.0000 [IU] | Freq: Once | INTRAVENOUS | Status: AC | PRN
  Administered 2015-03-28: 500 [IU]
  Filled 2015-03-28: qty 5

## 2015-03-28 MED ORDER — SODIUM CHLORIDE 0.9 % IJ SOLN
10.0000 mL | INTRAMUSCULAR | Status: DC | PRN
  Administered 2015-03-28: 10 mL
  Filled 2015-03-28: qty 10

## 2015-03-28 NOTE — Progress Notes (Signed)
ID: KC SEDLAK   DOB: [birth year]  MR#: [id number]  GXQ#:[id number]  PCP: Leeanne Rio, PA-C GYN: SU: Coralie Keens OTHER MD: Crissie Reese, Linna Hoff Bensimhon  CHIEF COMPLAINT:  Bilateral Breast Cancers  CURRENT TREATMENT: Anastrozole  BREAST CANCER HISTORY: From the original intake note:  The patient noted a small amount of drainage from her left breast December of 2012. She brought it to her gynecologist's attention in January of 2013 and was set up for bilateral diagnostic mammography at the [facility] Center July 24, 2011. This was the patient's first ever mammogram. It showed a large irregular mass in the left retroareolar region extending to the nipple, with nipple retraction and skin thickening. This measured approximately 8.4 cm. It was associated with pleomorphic microcalcifications. By exam there was moderate distortion and retraction of the nipple with a large palpable ill-defined area in the retroareolar region. In the right right breast there was also a 2 cm hard mass palpated. Ultrasound of the right breast mass showed a complex cystic/solid area measuring 1.9 cm. Ultrasound of the right axilla was negative. Ultrasound of the left breast showed a large hypoechoic mass measuring at least 3.8 cm. The left axilla showed several adjacent abnormal appearing lymph nodes.  With this information biopsy of the right and left breast masses were obtained the same day, and showed ([id number])   (a) on the right, and invasive ductal carcinoma with papillary features, estrogen and progesterone receptor positive, HER-2 negative, with an MIB-1 of 10%.   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 The MIB-1 was 60% for the left-sided tumor.   With this information the patient was presented at the multidisciplinary breast cancer conference 08/06/2011. Subsequent evaluation and treatments  are as detailed below.  INTERVAL HISTORY: Linda [name] returns today for follow up of her metastatic breast cancer, accompanied by her friend Butch Penny. Today she is due for cycle 5 of T-DM 1, which she receives every 21 days. The interval history is remarkable for a fall last week. She was reaching for a pot and simply got her feet tied up. She landed on her hands and knees and there is some bruising, but minimal pain today.  REVIEW OF SYSTEMS: Linda [name] denies fevers, chills, nausea, vomiting, or changes in bowel or bladder habits. Her appetite is good and she is staying well hydrated. She is down to lasix just weekly now. She denies swelling to her arms or legs. She has shortness of breath with exertion, but denies chest pain, cough, or palpitations. 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 She denies headaches, dizziness, or vision changes. She does not sleep well at night and wanted a sleep aid. She tried benadryl once and this made her too groggy the next day. A detailed review of systems is otherwise stable.  PAST MEDICAL HISTORY: Past Medical History  Diagnosis Date  . Cancer 08-13-11    07-31-11-Dx. Bilarteral Breast cancer-left greater than rt.  . Hematuria, undiagnosed cause 08-13-11    Being evaluated by [facility] 08-14-11  . Hypertension   . Bronchitis     hx  . GERD (gastroesophageal reflux disease)     doing well  . 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 Seroma 02/04/12    right breast  200cc removed  erythema on right side  . Seasonal allergies   . History of radiation therapy 02/20/12-04/15/12    left breast,total 60.4 Gy  . Wears partial dentures     wears upper and lower partial  . Allergy   . Blood transfusion without reported diagnosis   . 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   . Cancer of central  portion of left female breast 08/01/2011  . S/P radiation therapy 10/09/14-10/27/14    whole brain 37.5Gy/4f  . Acute pulmonary embolism 12/13/2014  . Acute on chronic diastolic CHF (congestive heart failure)     PAST SURGICAL HISTORY: Past Surgical History  Procedure Laterality Date  . Child birth  08-13-11    x3 -NVD  . Portacath placement  08/15/2011    Procedure: INSERTION PORT-A-CATH;  Surgeon: DHarl Bowie MD;  Location: [facility];  Service: General;  Laterality: N/A;  . Mastectomy w/ sentinel node biopsy  01/21/2012    Procedure: MASTECTOMY WITH SENTINEL LYMPH NODE BIOPSY;  Surgeon: DHarl Bowie MD;  [facility]  Service: General;  Laterality: Bilateral;  Left modified radical mastectomy, Rt mastectomy with Sentinel lymphnode biospy  . Breast surgery    . Port-a-cath removal Right 12/22/2012    Procedure: REMOVAL PORT-A-CATH;  Surgeon: DHarl Bowie MD;  [facility]  Service: General;  Laterality: Right;  . Subxyphoid pericardial window N/A 12/14/2014    Procedure: SUBXYPHOID PERICARDIAL WINDOW;  Surgeon: CRexene Alberts MD;  [facility]  Service: Thoracic;  Laterality: N/A;  . Tee without cardioversion N/A 12/14/2014    Procedure: TRANSESOPHAGEAL ECHOCARDIOGRAM (TEE);  Surgeon: CRexene Alberts MD;  [facility]  Service: Thoracic;  Laterality: N/A;  . Chest tube insertion Left 12/14/2014    Procedure: CHEST TUBE INSERTION;  Surgeon: CRexene Alberts MD;  [facility];  Service: Thoracic;  Laterality: Left;    FAMILY HISTORY Family History  Problem Relation Age of Onset  . Heart disease Mother   . Cancer Mother 437   breast, , 71deceased  . Heart attack Father   . Cancer Sister 679   breast  . Colon cancer Neg Hx   The patient's father died from a heart attack at the age of 870 The patient's mother died from apparently heart problems at the age of 862 The patient had no brothers. She has 3 sisters. One  of her sisters was diagnosed with breast cancer in her mid 692s The patient does not know if his sister was ever genetically tested. The patient's mother had a mastectomy at the age of 34 presumably for breast cancer. There is no other history of breast or in cancer in the family to her knowledge.   GYNECOLOGIC HISTORY:  She does not recall when she had menarche. She had her first child at age 69 She is GX P3. She underwent menopause in her mid 472s She never took hormone replacement.   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 [place] has a cGames developer[place] Son CGerald Stabslives [place] works as a fAirline pilot His wife is a nMarine scientist Son DShanon Browlives at [employer]and is a dAdvertising account executivein addition to having the m[employer] The patient attends a local [employer]  Church here   ADVANCED DIRECTIVES: Not in place. At the clinic visit 03/28/2015 the patient was given the appropriate forms to complete and notarize at her discretion. She tells me she intends to name her daughter-in-law Jacqlyn Larsen as her healthcare power of attorney  HEALTH MAINTENANCE: Social History  Substance Use Topics  . Smoking status: Never Smoker   . Smokeless tobacco: Never Used  . Alcohol Use: 0.0 oz/week    0 Standard drinks or equivalent per week     Comment: rare- occ.     Colonoscopy: Never  PAP: Feb 2013  Bone density: November 2013, normal  Lipid panel:   Allergies  Allergen Reactions  . Ace Inhibitors Cough    Current Outpatient Prescriptions  Medication Sig Dispense Refill  . anastrozole (ARIMIDEX) 1 MG tablet TAKE 1 TABLET BY MOUTH DAILY. 30 tablet 5  . b complex vitamins tablet Take 1 tablet by mouth daily.    . cholecalciferol (VITAMIN D) 1000 UNITS tablet Take 1 tablet (1,000 Units total) by mouth daily. 100 tablet 12  .  furosemide (LASIX) 20 MG tablet Take 1 tablet (20 mg total) by mouth every other day. (Patient taking differently: Take 20 mg by mouth once a week. ) 30 tablet 0  . magnesium oxide (MAG-OX) 400 (241.3 MG) MG tablet Take 1 tablet (400 mg total) by mouth daily. 30 tablet 0  . Multiple Vitamins-Iron (MULTIVITAMINS WITH IRON) TABS Take 1 tablet by mouth daily.    . potassium chloride SA (K-DUR,KLOR-CON) 20 MEQ tablet Take 2 tablets (40 mEq total) by mouth daily. 30 tablet 3  . tobramycin-dexamethasone (TOBRADEX) ophthalmic solution Place 1 drop into the left eye every 4 (four) hours while awake.     No current facility-administered medications for this visit.    OBJECTIVE: [name] in no acute distress Filed Vitals:   03/28/15 1314  BP: 129/76  Pulse: 93  Temp: 98.2 F (36.8 C)  Resp: 18     Body mass index is 32.49 kg/(m^2).    ECOG FS: 1 Filed Weights   03/28/15 1314  Weight: 220 lb 1.6 oz (99.837 kg)   Skin: warm, dry, bruising to bilateral knees  HEENT: sclerae anicteric, conjunctivae pink, oropharynx clear. No thrush or mucositis.  Lymph Nodes: No cervical or supraclavicular lymphadenopathy  Lungs: clear to auscultation bilaterally, no rales, wheezes, or rhonci  Heart: regular rate and rhythm  Abdomen: round, soft, non tender, positive bowel sounds  Musculoskeletal: No focal spinal tenderness, no peripheral edema  Neuro: non focal, well oriented, positive affect  Breasts: deferred   LAB RESULTS: Lab Results  Component Value Date   WBC 6.7 03/28/2015   NEUTROABS 4.4 03/28/2015   HGB 12.2 03/28/2015   HCT 37.3 03/28/2015   MCV 92.3 03/28/2015   PLT 231 03/28/2015      Chemistry      Component Value Date/Time   NA 140 03/28/2015 1241   NA 136 12/22/2014 0446   K 3.9 03/28/2015 1241   K 3.2* 12/22/2014 0446   CL 100* 12/22/2014 0446   CL 107 11/01/2012 1045   CO2 25 03/28/2015 1241   CO2 30 12/22/2014 0446   BUN 11.0 03/28/2015 1241   BUN <5*  12/22/2014 0446   CREATININE 0.7 03/28/2015 1241   CREATININE 0.67 12/22/2014 0446   CREATININE 0.61 06/30/2013 0959      Component Value Date/Time   CALCIUM 9.6 03/28/2015 1241   CALCIUM 8.2* 12/22/2014 0446   ALKPHOS 75 03/28/2015 1241  ALKPHOS 60 12/16/2014 0620   AST 21 03/28/2015 1241   AST 18 12/16/2014 0620   ALT 14 03/28/2015 1241   ALT 18 12/16/2014 0620   BILITOT 0.42 03/28/2015 1241   BILITOT 0.9 12/16/2014 0620       STUDIES: .Mr Kizzie Fantasia Contrast  03/18/2015  CLINICAL DATA:  69 year old female with breast cancer treated with chemotherapy. No new symptoms. Subsequent encounter. EXAM: MRI HEAD WITHOUT AND WITH CONTRAST TECHNIQUE: Multiplanar, multiecho pulse sequences of the brain and surrounding structures were obtained without and with intravenous contrast. CONTRAST:  59m MULTIHANCE GADOBENATE DIMEGLUMINE 529 MG/ML IV SOLN COMPARISON:  01/07/2015 and 09/25/2014. FINDINGS: No brain parenchymal enhancing lesion. Calvarial enhancing lesions largest left frontal calvarium measures up to 1.2 cm is unchanged and may represent treated/ quiescent tumor. Mild progression of nonspecific patchy periventricular white matter type changes may reflect result of treatment of tumor and/or small vessel disease type changes. No acute infarct. No intracranial hemorrhage. No hydrocephalus. Major intracranial vascular structures are patent. Persistent opacification mastoid air cells and middle ear cavities much more notable on the left without obstructing mass identified. Small pituitary gland unchanged. Cervical medullary junction, pineal region and orbital structures unremarkable. IMPRESSION: No brain parenchymal enhancing lesion. Calvarial enhancing lesions largest left frontal calvarium measures up to 1.2 cm is unchanged and may represent treated/ quiescent tumor. Mild progression of nonspecific patchy periventricular white matter type changes may reflect result of treatment of tumor and/or  small vessel disease type changes. Persistent opacification mastoid air cells and middle ear cavities much more notable on the left without obstructing mass identified. Electronically Signed   By: SGenia DelM.D.   On: 03/18/2015 15:48    ASSESSMENT: 69y.o.  [place] woman   (1)  status post bilateral breast biopsies 07/24/2011, showing,      (a) on the right, a clinical T2 N0 papillary/ductal breast cancer, estrogen and progesterone receptor positive, HER-2 negative, with an MIB-1 of 10%;     (b) on the left, a clinical T3 N1, stage IIIA invasive ductal carcinoma, grade 3, triple positive, with an MIB-1 of 60%.  (2)  Status post 4 dose dense cycles of doxorubicin/ cyclophosphamide followed by 4 dose dense cycles of paclitaxel and trastuzumab completed 12/09/2011  (3) the trastuzumab was continued for a total of one year (to 11/01/2012). Final echo on 11/04/2012 showing a well preserved ejection fraction of 55-60%.  (4) s/p bilateral mastectomies 01/21/2012 showing   (a) on the Right, an 8 mm invasive papillary carcinoma, grade 1, ypT1b ypN0   (b) on the Left, multiple microscopic foci of residual  Invasive ductal carcinoma with evidence of dermal lymphatic involvement, pyT1a/T4 pyN0  (5)  Postmastectomy radiation, completed 04/15/2012  (6) Started anastrazole 04/16/2012; [facility], bones, left effusion (7) CT angiogram 09/11/2014 shows new left pericardial effusion and new mediastinal and hilar lymphadenopathy; there were no suspicious upper abdominal findings; Cytology from the left effusion 09/11/2014 ([id number]) shows malignant cells which are HER-2 positive  (8) Whole body bone scan on 09/25/14 showed metastatic foci throughout axial and appendicular skeleton. Areas of most potential concern include disease in the thoracic spine, disease in the right acetabulum, right superior pubic ramus and possibly  proximal right femur, disease in the right humeral shaft and in both femoral shafts.  (8) Started trastuzumab/pertuzumab 09/26/2014, discontinued after 11/29/2014 dose, w progression  (a) T-DM1 started 01/02/2015  (b) echocardiogram 01/30/2015 shows a well  preserved ejection fraction.  (9) Brain MRI on 09/25/14 was positive for numerous small enhancing brain mets and calvarium/bone mets   (a) whole brain radiation on 10/09/14--10/27/2014  (b) brain MRI 01/07/2015 shows a complete clinical response  (10) started zolendronate 11/29/2014, to be repeated every 12 weeks  11) left pleural effusion re-tapped 12/08/2014 (a) left chest tube placement 12/14/2014  (12) large pericardial effusion per echo 12/13/2014  (a) pericardial window placement 12/14/2014; fluid is hemorrhagic; path negative  (13) Right-sided pulmonary emboli noted on CT scan 12/13/2014  PLAN: Linda [name] continues to manage treatment well. The labs were reviewed in detail and were stable. Her potassium level is stable, and she would like to reduce her supplementation to 1 day weekly on the day she is already taking lasix. She will proceed with cycle 5 of T-DM1 as planned today.   We discussed her sleeping habits, and benadryl made her too groggy. She will try melatonin nightly instead.   Linda [name] will return in 3 weeks for her next cycle of treatment and follow up with Dr. Jana Hakim. Prior to this day she will have a repeat chest CT. She understands and agrees with this plan. She knows the goal of treatment in her case is control. She has been encouraged to call with any issues that might arise before her next visit here.  Laurie Panda, NP

## 2015-03-28 NOTE — Progress Notes (Signed)
Pt wishes to leave infusion without waiting for 30 minute observation period.  Pt discharged without any complications or complaints.

## 2015-03-28 NOTE — Patient Instructions (Signed)
[facility] Discharge Instructions for Patients Receiving Chemotherapy  Today you received the following chemotherapy agents:  Kadcyla  To help prevent nausea and vomiting after your treatment, we encourage you to take your nausea medication as prescribed.   If you develop nausea and vomiting that is not controlled by your nausea medication, call the clinic.   BELOW ARE SYMPTOMS THAT SHOULD BE REPORTED IMMEDIATELY:  *FEVER GREATER THAN 100.5 F  *CHILLS WITH OR WITHOUT FEVER  NAUSEA AND VOMITING THAT IS NOT CONTROLLED WITH YOUR NAUSEA MEDICATION  *UNUSUAL SHORTNESS OF BREATH  *UNUSUAL BRUISING OR BLEEDING  TENDERNESS IN MOUTH AND THROAT WITH OR WITHOUT PRESENCE OF ULCERS  *URINARY PROBLEMS  *BOWEL PROBLEMS  UNUSUAL RASH Items with * indicate a potential emergency and should be followed up as soon as possible.  Feel free to call the clinic you have any questions or concerns. The clinic phone number is [phone].  Please show the CHEMO ALERT CARD at check-in to the Emergency Department and triage nurse.   

## 2015-03-28 NOTE — Telephone Encounter (Signed)
Appointments made and avs printed for patient °

## 2015-04-02 ENCOUNTER — Other Ambulatory Visit (HOSPITAL_COMMUNITY): Payer: Self-pay | Admitting: Internal Medicine

## 2015-04-03 ENCOUNTER — Other Ambulatory Visit: Payer: Self-pay | Admitting: Nurse Practitioner

## 2015-04-03 ENCOUNTER — Telehealth: Payer: Self-pay | Admitting: *Deleted

## 2015-04-03 NOTE — Telephone Encounter (Signed)
Confirmed appointment for 04/11/15 with Dr. Tammi Klippel.  Reconfirmed she wants to switch from Dr. Lisbeth Renshaw.

## 2015-04-10 ENCOUNTER — Encounter: Payer: Self-pay | Admitting: Radiation Oncology

## 2015-04-10 NOTE — Progress Notes (Signed)
Location/Histology of Brain Tumor: Bilateral breast cancer with brain mets  Patient referred to Dr. Tammi Klippel following routine brain MRI on 03/18/15  Past or anticipated interventions, if any, per neurosurgery: no  Past or anticipated interventions, if any, per medical oncology:  Status post 4 dose dense cycles of doxorubicin/ cyclophosphamide followed by 4 dose dense cycles of paclitaxel and trastuzumab completed 12/09/2011  Trastuzumab was continued for a total of one year   Started anastrazole 04/16/2012  Started trastuzumab/pertuzumab 09/26/2014, discontinued after 11/29/2014 dose, w progression  Dose of Decadron, if applicable: no  Recent neurologic symptoms, if any:   Seizures: no  Headaches: no  Nausea: no  Dizziness/ataxia: no  Difficulty with hand coordination: no  Focal numbness/weakness: no  Visual deficits/changes: no  Confusion/Memory deficits: only complaint is forgetfulness  Painful bone metastases at present, if any: no  SAFETY ISSUES:  Prior radiation? yes  Pacemaker/ICD? no  Possible current pregnancy? no  Is the patient on methotrexate? no  Additional Complaints / other details: 69 year old female. Referred back to radiation oncology following 03/18/15 MRI (see results below). No brain parenchymal enhancing lesion.  Calvarial enhancing lesions largest left frontal calvarium measures up to 1.2 cm is unchanged and may represent treated/ quiescent tumor.  Mild progression of nonspecific patchy periventricular white matter type changes may reflect result of treatment of tumor and/or small vessel disease type changes.  Persistent opacification mastoid air cells and middle ear cavities much more notable on the left without obstructing mass identified

## 2015-04-11 ENCOUNTER — Encounter: Payer: Self-pay | Admitting: Radiation Oncology

## 2015-04-11 ENCOUNTER — Ambulatory Visit
Admission: RE | Admit: 2015-04-11 | Discharge: 2015-04-11 | Disposition: A | Discharge status: Home / Self Care | Payer: Medicare Other | Source: Ambulatory Visit | Attending: Radiation Oncology | Admitting: Radiation Oncology

## 2015-04-11 ENCOUNTER — Ambulatory Visit: Payer: Self-pay | Admitting: Radiation Oncology

## 2015-04-11 VITALS — BP 119/75 | HR 98 | Resp 16 | Ht 69.0 in | Wt 218.9 lb

## 2015-04-11 DIAGNOSIS — Z79899 Other long term (current) drug therapy: Secondary | ICD-10-CM | POA: Diagnosis not present

## 2015-04-11 DIAGNOSIS — C7931 Secondary malignant neoplasm of brain: Secondary | ICD-10-CM | POA: Insufficient documentation

## 2015-04-11 NOTE — Progress Notes (Signed)
Radiation Oncology         [phone] ________________________________  Name: Linda [name] MRN: [id number]  Date: 04/11/2015  DOB: 1945-07-19  Reconsult Visit Note  CC: Linda Rio, PA-C  Magrinat, Virgie Dad, MD  Diagnosis: 69 yo woman with bilateral breast cancer with brain mets.    ICD-9-CM ICD-10-CM   1. Brain metastases ([place]) 198.3 C79.31     Radiation treatment dates:   10/09/14 - 10/27/14  Site/dose:   The patient received whole brain radiation treatment to a dose of 37.5 Gy in 15 fractions with Dr. Lisbeth [name].  Narrative:  Patient referred to Dr. Tammi [name] following routine brain MRI on 03/18/15. Patient denies pain, headaches, and problems walking and talking. She reports some forgetfulness.  ALLERGIES:  is allergic to ace inhibitors.  Meds: Current Outpatient Prescriptions  Medication Sig Dispense Refill  . anastrozole (ARIMIDEX) 1 MG tablet TAKE 1 TABLET BY MOUTH DAILY. 30 tablet 5  . b complex vitamins tablet Take 1 tablet by mouth daily.    . cholecalciferol (VITAMIN D) 1000 UNITS tablet Take 1 tablet (1,000 Units total) by mouth daily. 100 tablet 12  . furosemide (LASIX) 20 MG tablet Take 1 tablet (20 mg total) by mouth every other day. (Patient taking differently: Take 20 mg by mouth once a week. Patient reports taking this medication once per week.) 30 tablet 0  . magnesium oxide (MAG-OX) 400 (241.3 MG) MG tablet Take 1 tablet (400 mg total) by mouth daily. 30 tablet 0  . Multiple Vitamins-Iron (MULTIVITAMINS WITH IRON) TABS Take 1 tablet by mouth daily.    . potassium chloride SA (K-DUR,KLOR-CON) 20 MEQ tablet TAKE 2 TABLETS BY MOUTH DAILY (Patient taking differently: TAKE 1 TABLETS BY MOUTH DAILY) 30 tablet 3   No current facility-administered medications for this encounter.    Physical Findings: The patient is in no acute distress. Patient is alert and oriented.  height is 5\' 9"  (1.753 m) and weight is 218 lb 14.4 oz (99.292 kg). Her blood pressure is  119/75 and her pulse is 98. Her respiration is 16 and oxygen saturation is 100%. .  No significant changes.  Lab Findings: Lab Results  Component Value Date   WBC 6.7 03/28/2015   WBC 6.4 12/20/2014   WBC 11.6* 09/10/2014   HGB 12.2 03/28/2015   HGB 10.4* 12/20/2014   HGB 11.0* 09/10/2014   HCT 37.3 03/28/2015   HCT 32.8* 12/20/2014   HCT 35.1* 09/10/2014   PLT 231 03/28/2015   PLT 222 12/20/2014    Lab Results  Component Value Date   NA 140 03/28/2015   NA 136 12/22/2014   K 3.9 03/28/2015   K 3.2* 12/22/2014   CHLORIDE 107 03/28/2015   CO2 25 03/28/2015   CO2 30 12/22/2014   GLUCOSE 109 03/28/2015   GLUCOSE 100* 12/22/2014   GLUCOSE 96 11/01/2012   BUN 11.0 03/28/2015   BUN <5* 12/22/2014   CREATININE 0.7 03/28/2015   CREATININE 0.67 12/22/2014   CREATININE 0.61 06/30/2013   BILITOT 0.42 03/28/2015   BILITOT 0.9 12/16/2014   ALKPHOS 75 03/28/2015   ALKPHOS 60 12/16/2014   AST 21 03/28/2015   AST 18 12/16/2014   ALT 14 03/28/2015   ALT 18 12/16/2014   PROT 7.7 03/28/2015   PROT 5.0* 12/16/2014   ALBUMIN 3.6 03/28/2015   ALBUMIN 2.4* 12/16/2014   CALCIUM 9.6 03/28/2015   CALCIUM 8.2* 12/22/2014   ANIONGAP 8 03/28/2015   ANIONGAP 6 12/22/2014    Radiographic  Findings: Mr Linda [name] Contrast  03/18/2015  CLINICAL DATA:  69 year old female with breast cancer treated with chemotherapy. No new symptoms. Subsequent encounter. EXAM: MRI HEAD WITHOUT AND WITH CONTRAST TECHNIQUE: Multiplanar, multiecho pulse sequences of the brain and surrounding structures were obtained without and with intravenous contrast. CONTRAST:  66mL MULTIHANCE GADOBENATE DIMEGLUMINE 529 MG/ML IV SOLN COMPARISON:  01/07/2015 and 09/25/2014. FINDINGS: No brain parenchymal enhancing lesion. Calvarial enhancing lesions largest left frontal calvarium measures up to 1.2 cm is unchanged and may represent treated/ quiescent tumor. Mild progression of nonspecific patchy periventricular white matter type  changes may reflect result of treatment of tumor and/or small vessel disease type changes. No acute infarct. No intracranial hemorrhage. No hydrocephalus. Major intracranial vascular structures are patent. Persistent opacification mastoid air cells and middle ear cavities much more notable on the left without obstructing mass identified. Small pituitary gland unchanged. Cervical medullary junction, pineal region and orbital structures unremarkable. IMPRESSION: No brain parenchymal enhancing lesion. Calvarial enhancing lesions largest left frontal calvarium measures up to 1.2 cm is unchanged and may represent treated/ quiescent tumor. Mild progression of nonspecific patchy periventricular white matter type changes may reflect result of treatment of tumor and/or small vessel disease type changes. Persistent opacification mastoid air cells and middle ear cavities much more notable on the left without obstructing mass identified. Electronically Signed   By: Linda [name] M.D.   On: 03/18/2015 15:48    Impression:  The patient is recovering from the effects of radiation. Patients most recent brain MRI shows no active brain metastasis. She maintains an excellent performance status and is planning on going back to work.  Plan: Repeat brain MRI in 3 months with follow up after.  _____________________________________  Linda [name]. Linda [name], M.D.   This document serves as a record of services personally performed by Linda Pita, MD. It was created on his behalf by Linda [name], a trained medical scribe. The creation of this record is based on the scribe's personal observations and the provider's statements to them. This document has been checked and approved by the attending provider.

## 2015-04-11 NOTE — Progress Notes (Signed)
See progress note under physician encounter. 

## 2015-04-12 ENCOUNTER — Other Ambulatory Visit: Payer: Self-pay | Admitting: *Deleted

## 2015-04-12 DIAGNOSIS — C50911 Malignant neoplasm of unspecified site of right female breast: Secondary | ICD-10-CM

## 2015-04-12 MED ORDER — ANASTROZOLE 1 MG PO TABS: 1.0000 mg | ORAL_TABLET | Freq: Every day | ORAL | Status: DC

## 2015-04-13 ENCOUNTER — Encounter (HOSPITAL_COMMUNITY): Payer: Self-pay

## 2015-04-13 ENCOUNTER — Ambulatory Visit (HOSPITAL_COMMUNITY)
Admission: RE | Admit: 2015-04-13 | Discharge: 2015-04-13 | Disposition: A | Discharge status: Home / Self Care | Payer: Medicare Other | Source: Ambulatory Visit | Attending: Nurse Practitioner | Admitting: Nurse Practitioner

## 2015-04-13 DIAGNOSIS — C50912 Malignant neoplasm of unspecified site of left female breast: Secondary | ICD-10-CM | POA: Insufficient documentation

## 2015-04-13 DIAGNOSIS — C7800 Secondary malignant neoplasm of unspecified lung: Secondary | ICD-10-CM

## 2015-04-13 DIAGNOSIS — I7090 Unspecified atherosclerosis: Secondary | ICD-10-CM | POA: Diagnosis not present

## 2015-04-13 DIAGNOSIS — M899 Disorder of bone, unspecified: Secondary | ICD-10-CM | POA: Diagnosis not present

## 2015-04-13 DIAGNOSIS — C50911 Malignant neoplasm of unspecified site of right female breast: Secondary | ICD-10-CM | POA: Diagnosis present

## 2015-04-13 DIAGNOSIS — Z08 Encounter for follow-up examination after completed treatment for malignant neoplasm: Secondary | ICD-10-CM | POA: Insufficient documentation

## 2015-04-13 DIAGNOSIS — C50919 Malignant neoplasm of unspecified site of unspecified female breast: Secondary | ICD-10-CM

## 2015-04-13 MED ORDER — IOHEXOL 300 MG/ML  SOLN
75.0000 mL | Freq: Once | INTRAMUSCULAR | Status: AC | PRN
  Administered 2015-04-13: 75 mL via INTRAVENOUS

## 2015-04-17 ENCOUNTER — Telehealth: Payer: Self-pay | Admitting: Nurse Practitioner

## 2015-04-17 ENCOUNTER — Telehealth: Payer: Self-pay | Admitting: Oncology

## 2015-04-17 ENCOUNTER — Ambulatory Visit (HOSPITAL_BASED_OUTPATIENT_CLINIC_OR_DEPARTMENT_OTHER): Payer: Medicare Other

## 2015-04-17 ENCOUNTER — Other Ambulatory Visit (HOSPITAL_BASED_OUTPATIENT_CLINIC_OR_DEPARTMENT_OTHER): Payer: Medicare Other

## 2015-04-17 ENCOUNTER — Other Ambulatory Visit: Payer: Self-pay | Admitting: *Deleted

## 2015-04-17 ENCOUNTER — Ambulatory Visit (HOSPITAL_BASED_OUTPATIENT_CLINIC_OR_DEPARTMENT_OTHER): Payer: Medicare Other | Admitting: Oncology

## 2015-04-17 VITALS — BP 136/77 | HR 89 | Temp 98.1°F | Resp 18 | Ht 69.0 in | Wt 217.1 lb

## 2015-04-17 DIAGNOSIS — C7931 Secondary malignant neoplasm of brain: Secondary | ICD-10-CM

## 2015-04-17 DIAGNOSIS — Z86711 Personal history of pulmonary embolism: Secondary | ICD-10-CM | POA: Diagnosis not present

## 2015-04-17 DIAGNOSIS — C50911 Malignant neoplasm of unspecified site of right female breast: Secondary | ICD-10-CM

## 2015-04-17 DIAGNOSIS — C50211 Malignant neoplasm of upper-inner quadrant of right female breast: Secondary | ICD-10-CM

## 2015-04-17 DIAGNOSIS — C50812 Malignant neoplasm of overlapping sites of left female breast: Secondary | ICD-10-CM

## 2015-04-17 DIAGNOSIS — J910 Malignant pleural effusion: Secondary | ICD-10-CM | POA: Diagnosis not present

## 2015-04-17 DIAGNOSIS — Z5112 Encounter for antineoplastic immunotherapy: Secondary | ICD-10-CM | POA: Diagnosis present

## 2015-04-17 DIAGNOSIS — C7951 Secondary malignant neoplasm of bone: Secondary | ICD-10-CM

## 2015-04-17 DIAGNOSIS — C50212 Malignant neoplasm of upper-inner quadrant of left female breast: Principal | ICD-10-CM

## 2015-04-17 DIAGNOSIS — C50912 Malignant neoplasm of unspecified site of left female breast: Secondary | ICD-10-CM

## 2015-04-17 DIAGNOSIS — C50919 Malignant neoplasm of unspecified site of unspecified female breast: Secondary | ICD-10-CM

## 2015-04-17 DIAGNOSIS — C50811 Malignant neoplasm of overlapping sites of right female breast: Secondary | ICD-10-CM

## 2015-04-17 DIAGNOSIS — C7800 Secondary malignant neoplasm of unspecified lung: Secondary | ICD-10-CM

## 2015-04-17 LAB — CBC WITH DIFFERENTIAL/PLATELET
BASO%: 0.8 % (ref 0.0–2.0)
BASOS ABS: 0.1 10*3/uL (ref 0.0–0.1)
EOS%: 2.6 % (ref 0.0–7.0)
Eosinophils Absolute: 0.2 10*3/uL (ref 0.0–0.5)
HEMATOCRIT: 37.7 % (ref 34.8–46.6)
HGB: 12.3 g/dL (ref 11.6–15.9)
LYMPH#: 1.7 10*3/uL (ref 0.9–3.3)
LYMPH%: 25.8 % (ref 14.0–49.7)
MCH: 30.1 pg (ref 25.1–34.0)
MCHC: 32.7 g/dL (ref 31.5–36.0)
MCV: 92.1 fL (ref 79.5–101.0)
MONO#: 0.6 10*3/uL (ref 0.1–0.9)
MONO%: 9.8 % (ref 0.0–14.0)
NEUT#: 3.9 10*3/uL (ref 1.5–6.5)
NEUT%: 61 % (ref 38.4–76.8)
PLATELETS: 221 10*3/uL (ref 145–400)
RBC: 4.09 10*6/uL (ref 3.70–5.45)
RDW: 15 % — ABNORMAL HIGH (ref 11.2–14.5)
WBC: 6.5 10*3/uL (ref 3.9–10.3)

## 2015-04-17 LAB — COMPREHENSIVE METABOLIC PANEL (CC13)
ALT: 16 U/L (ref 0–55)
AST: 23 U/L (ref 5–34)
Albumin: 3.6 g/dL (ref 3.5–5.0)
Alkaline Phosphatase: 68 U/L (ref 40–150)
Anion Gap: 7 mEq/L (ref 3–11)
BUN: 11.8 mg/dL (ref 7.0–26.0)
CHLORIDE: 106 meq/L (ref 98–109)
CO2: 28 meq/L (ref 22–29)
CREATININE: 0.7 mg/dL (ref 0.6–1.1)
Calcium: 10 mg/dL (ref 8.4–10.4)
EGFR: 83 mL/min/{1.73_m2} — ABNORMAL LOW (ref >90)
GLUCOSE: 105 mg/dL (ref 70–140)
POTASSIUM: 4 meq/L (ref 3.5–5.1)
Sodium: 141 mEq/L (ref 136–145)
Total Bilirubin: 0.39 mg/dL (ref 0.20–1.20)
Total Protein: 7.9 g/dL (ref 6.4–8.3)

## 2015-04-17 MED ORDER — ADO-TRASTUZUMAB EMTANSINE CHEMO INJECTION 160 MG
3.5000 mg/kg | Freq: Once | INTRAVENOUS | Status: AC
  Administered 2015-04-17: 360 mg via INTRAVENOUS
  Filled 2015-04-17: qty 13

## 2015-04-17 MED ORDER — SODIUM CHLORIDE 0.9 % IV SOLN
Freq: Once | INTRAVENOUS | Status: AC
  Administered 2015-04-17: 14:00:00 via INTRAVENOUS

## 2015-04-17 MED ORDER — DIPHENHYDRAMINE HCL 25 MG PO CAPS
ORAL_CAPSULE | ORAL | Status: AC
  Filled 2015-04-17: qty 1

## 2015-04-17 MED ORDER — ACETAMINOPHEN 325 MG PO TABS
ORAL_TABLET | ORAL | Status: AC
  Filled 2015-04-17: qty 2

## 2015-04-17 MED ORDER — ACETAMINOPHEN 325 MG PO TABS
650.0000 mg | ORAL_TABLET | Freq: Once | ORAL | Status: AC
  Administered 2015-04-17: 650 mg via ORAL

## 2015-04-17 MED ORDER — DIPHENHYDRAMINE HCL 25 MG PO CAPS
25.0000 mg | ORAL_CAPSULE | Freq: Once | ORAL | Status: AC
Start: 2015-04-17 — End: 2015-04-17
  Administered 2015-04-17: 25 mg via ORAL

## 2015-04-17 MED ORDER — HEPARIN SOD (PORK) LOCK FLUSH 100 UNIT/ML IV SOLN
500.0000 [IU] | Freq: Once | INTRAVENOUS | Status: AC | PRN
  Administered 2015-04-17: 500 [IU]
  Filled 2015-04-17: qty 5

## 2015-04-17 MED ORDER — SODIUM CHLORIDE 0.9 % IJ SOLN
10.0000 mL | INTRAMUSCULAR | Status: DC | PRN
  Administered 2015-04-17: 10 mL
  Filled 2015-04-17: qty 10

## 2015-04-17 NOTE — Progress Notes (Signed)
ID: [name]   DOB: [birth year]  MR#: [id number]  NKN#:[id number]  PCP: Leeanne Rio, PA-C GYN: SU: Coralie Keens OTHER MD: Crissie Reese, Linna Hoff Bensimhon  CHIEF COMPLAINT:  Bilateral Breast Cancers  CURRENT TREATMENT: Anastrozole  BREAST CANCER HISTORY: From the original intake note:  The patient noted a small amount of drainage from her left breast December of 2012. She brought it to her gynecologist's attention in January of 2013 and was set up for bilateral diagnostic mammography at the [facility] Center July 24, 2011. This was the patient's first ever mammogram. It showed a large irregular mass in the left retroareolar region extending to the nipple, with nipple retraction and skin thickening. This measured approximately 8.4 cm. It was associated with pleomorphic microcalcifications. By exam there was moderate distortion and retraction of the nipple with a large palpable ill-defined area in the retroareolar region. In the right right breast there was also a 2 cm hard mass palpated. Ultrasound of the right breast mass showed a complex cystic/solid area measuring 1.9 cm. Ultrasound of the right axilla was negative. Ultrasound of the left breast showed a large hypoechoic mass measuring at least 3.8 cm. The left axilla showed several adjacent abnormal appearing lymph nodes.  With this information biopsy of the right and left breast masses were obtained the same day, and showed ([id number])   (a) on the right, and invasive ductal carcinoma with papillary features, estrogen and progesterone receptor positive, HER-2 negative, with an MIB-1 of 10%.   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 The MIB-1 was 60% for the left-sided tumor.   With this information the patient was presented at the multidisciplinary breast cancer conference 08/06/2011. Subsequent evaluation and treatments  are as detailed below.  INTERVAL HISTORY: Linda [name] returns today for follow up of her metastatic breast cancer, accompanied by her friend Butch Penny. Today she is due for cycle 6 of T-DM 1, given every 21 days. She tolerates the treatment with no side effects whatsoever that she is aware of.Inparticularshedeniesanyrash,fever,fatigue or any problems with her port. Since her last visit here she also had a restaging chest CT which shows a wonderful response, with clearing of her effusions and greatly improved lung aeration. She still has sclerotic bone lesions of course. These appear treated   REVIEW OF SYSTEMS: Linda [name] had a fall about 4 years ago but has had no further problems regarding that. She denies headaches, she does have some blurred vision and tells me she needs to get new glasses. She's had no nausea or vomiting problems. She would like to drive. Recall she had a negative brain MRI in October and has never had a seizure. She is taking classes with [employer] and is trying to get back to work in January. She cleans her house and goes to church on Sunday, but does not: Walks on a regular basis. Aside from the symptoms described above a detailed review of systems today was noncontributory.  PAST MEDICAL HISTORY: Past Medical History  Diagnosis Date  . Cancer ([place]) 08-13-11    07-31-11-Dx. Bilarteral Breast cancer-left greater than rt.  . Hematuria, undiagnosed cause 08-13-11    Being evaluated by [facility] 08-14-11  . Hypertension   . Bronchitis     hx  . GERD (gastroesophageal reflux disease)     doing well  . Breast cancer ([place]) 07/30/11 dx    Right  invasive ductal  ca 7 0'clock,& left breast=invasive ductal ca  and dcis, left axilla nlymph node, metastatic ca  . Seroma 02/04/12    right breast  200cc removed  erythema on right side  . Seasonal allergies   . History of radiation therapy 02/20/12-04/15/12    left breast,total 60.4 Gy  . Wears partial dentures     wears upper and lower partial   . Allergy   . Blood transfusion without reported diagnosis   . 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   . Cancer of central portion of left female breast ([place]) 08/01/2011  . S/P radiation therapy 10/09/14-10/27/14    whole brain 37.5Gy/67f  . [place]ism [place] [place] 12/13/2014  . Acute on chronic diastolic CHF (congestive heart failure) ([place]     PAST SURGICAL HISTORY: Past Surgical History  Procedure Laterality Date  . Child birth  08-13-11    x3 -NVD  . Portacath placement  08/15/2011    Procedure: INSERTION PORT-A-CATH;  Surgeon: DHarl Bowie MD;  Location: [facility];  Service: General;  Laterality: N/A;  . Mastectomy w/ sentinel node biopsy  01/21/2012    Procedure: MASTECTOMY WITH SENTINEL LYMPH NODE BIOPSY;  Surgeon: DHarl Bowie MD;  [facility]  Service: General;  Laterality: Bilateral;  Left modified radical mastectomy, Rt mastectomy with Sentinel lymphnode biospy  . Breast surgery    . Port-a-cath removal Right 12/22/2012    Procedure: REMOVAL PORT-A-CATH;  Surgeon: DHarl Bowie MD;  [facility]  Service: General;  Laterality: Right;  . Subxyphoid pericardial window N/A 12/14/2014    Procedure: SUBXYPHOID PERICARDIAL WINDOW;  Surgeon: CRexene Alberts MD;  [facility]  Service: Thoracic;  Laterality: N/A;  . Tee without cardioversion N/A 12/14/2014    Procedure: TRANSESOPHAGEAL ECHOCARDIOGRAM (TEE);  Surgeon: CRexene Alberts MD;  [facility]  Service: Thoracic;  Laterality: N/A;  . Chest tube insertion Left 12/14/2014    Procedure: CHEST TUBE INSERTION;  Surgeon: CRexene Alberts MD;  [facility]  Service: Thoracic;  Laterality: Left;    FAMILY HISTORY Family History  Problem Relation Age of Onset  . Heart disease Mother   . Cancer Mother 459   breast, , 762deceased  . Heart attack Father   . Cancer Sister 625   breast  . Colon cancer Neg Hx    The patient's father died from a heart attack at the age of 842 The patient's mother died from apparently heart problems at the age of 873 The patient had no brothers. She has 3 sisters. One of her sisters was diagnosed with breast cancer in her mid 622s The patient does not know if his sister was ever genetically tested. The patient's mother had a mastectomy at the age of 344 presumably for breast cancer. There is no other history of breast or in cancer in the family to her knowledge.   GYNECOLOGIC HISTORY:  She does not recall when she had menarche. She had her first child at age 69 She is GX P3. She underwent menopause in her mid 44s She never took hormone replacement.   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 His wife is a Marine scientist. Son Shanon Brow lives at [place] and is a Advertising account executive in addition to having the [employer]. The patient attends a local [employer] here   ADVANCED DIRECTIVES: Not in place. At the clinic visit 04/17/2015 the patient was given the appropriate forms to complete and notarize at her discretion. She tells me she ihas named her daughter-in-law Jacqlyn Larsen as her healthcare power of attorney  HEALTH MAINTENANCE: Social History  Substance Use Topics  . Smoking status: Never Smoker   . Smokeless tobacco: Never Used  . Alcohol Use: 0.0 oz/week    0 Standard drinks or equivalent per week     Comment: rare- occ.     Colonoscopy: Never  PAP: Feb 2013  Bone density: November 2013, normal  Lipid panel:   Allergies  Allergen Reactions  . Ace Inhibitors Cough    Current Outpatient Prescriptions  Medication Sig Dispense Refill  . anastrozole (ARIMIDEX) 1 MG tablet Take 1 tablet (1 mg total) by mouth  daily. 30 tablet 5  . b complex vitamins tablet Take 1 tablet by mouth daily.    . cholecalciferol (VITAMIN D) 1000 UNITS tablet Take 1 tablet (1,000 Units total) by mouth daily. 100 tablet 12  . furosemide (LASIX) 20 MG tablet Take 1 tablet (20 mg total) by mouth every other day. (Patient taking differently: Take 20 mg by mouth once a week. Patient reports taking this medication once per week.) 30 tablet 0  . magnesium oxide (MAG-OX) 400 (241.3 MG) MG tablet Take 1 tablet (400 mg total) by mouth daily. 30 tablet 0  . Multiple Vitamins-Iron (MULTIVITAMINS WITH IRON) TABS Take 1 tablet by mouth daily.    . potassium chloride SA (K-DUR,KLOR-CON) 20 MEQ tablet TAKE 2 TABLETS BY MOUTH DAILY (Patient taking differently: TAKE 1 TABLETS BY MOUTH DAILY) 30 tablet 3   No current facility-administered medications for this visit.    OBJECTIVE: [name] who appears stated age 60 Vitals:   04/17/15 1143  BP: 136/77  Pulse: 89  Temp: 98.1 F (36.7 C)  Resp: 18     Body mass index is 32.05 kg/(m^2).    ECOG FS: 1 Filed Weights   04/17/15 1143  Weight: 217 lb 1.6 oz (98.476 kg)   Sclerae unicteric, pupils round and equal Oropharynx clear and moist-- no thrush or other lesions No cervical or supraclavicular adenopathy Lungs no rales or rhonchi Heart regular rate and rhythm Abd soft, nontender, positive bowel sounds MSK no focal spinal tenderness, no upper extremity lymphedema Neuro: nonfocal, well oriented 3, normal short-term memory, appropriate affect Breasts: Status post bilateral mastectomies. There is no evidence of chest wall recurrence. Both axillae are benign.     LAB RESULTS: Lab Results  Component Value Date   WBC 6.5 04/17/2015   NEUTROABS 3.9 04/17/2015   HGB 12.3 04/17/2015   HCT 37.7 04/17/2015   MCV 92.1 04/17/2015   PLT 221 04/17/2015      Chemistry      Component Value Date/Time   NA 140 03/28/2015 1241   NA 136 12/22/2014 0446   K 3.9  03/28/2015 1241   K 3.2* 12/22/2014 0446   CL 100* 12/22/2014 0446   CL 107 11/01/2012 1045   CO2 25 03/28/2015 1241   CO2 30 12/22/2014 0446   BUN 11.0 03/28/2015 1241   BUN <5* 12/22/2014 0446   CREATININE 0.7 03/28/2015 1241   CREATININE 0.67 12/22/2014 0446   CREATININE 0.61 06/30/2013 0959  Component Value Date/Time   CALCIUM 9.6 03/28/2015 1241   CALCIUM 8.2* 12/22/2014 0446   ALKPHOS 75 03/28/2015 1241   ALKPHOS 60 12/16/2014 0620   AST 21 03/28/2015 1241   AST 18 12/16/2014 0620   ALT 14 03/28/2015 1241   ALT 18 12/16/2014 0620   BILITOT 0.42 03/28/2015 1241   BILITOT 0.9 12/16/2014 0620       STUDIES: .Ct Chest W Contrast  04/13/2015  CLINICAL DATA:  Restaging bilateral breast cancer status post bilateral mastectomy in 2013. No current complaints. Subsequent encounter. EXAM: CT CHEST WITH CONTRAST TECHNIQUE: Multidetector CT imaging of the chest was performed during intravenous contrast administration. CONTRAST:  52m OMNIPAQUE IOHEXOL 300 MG/ML  SOLN COMPARISON:  Radiographs 01/01/2015.  CT 12/13/2014. FINDINGS: Mediastinum/Nodes: There are no enlarged mediastinal, hilar or axillary lymph nodes.Patient is status post bilateral mastectomy and axillary node dissection. The thyroid gland, esophagus and trachea demonstrate no significant findings. The heart size is stable. The previously demonstrated pericardial effusion has resolved. No recurrent pulmonary emboli demonstrated on non dedicated imaging. There is stable mild atherosclerosis of the aorta, great vessels and coronary arteries. Right IJ Port-A-Cath tip extends to SVC right atrial junction. Lungs/Pleura: Bilateral pleural effusions have resolved. There is significant improvement in the pulmonary aeration. Mild linear scarring remains anteriorly in the left upper lobe, likely influenced by previous radiation therapy. No confluent airspace opacity, endobronchial lesion or suspicious pulmonary nodule. Upper abdomen: Gas  containing stones are noted within the gallbladder lumen. The visualized liver and adrenal glands appear unremarkable. Musculoskeletal/Chest wall: Grossly stable multifocal sclerosis throughout the spine most consistent with treated metastatic disease. This sclerosis is most prominent within the T4 and T5 vertebral bodies. No evidence of pathologic fracture or epidural tumor. No evidence of chest wall mass. IMPRESSION: 1. Interval resolution of bilateral pleural and pericardial effusions with significantly improved aeration of the lungs. 2. No evidence of local recurrence or progressive metastatic disease. 3. Stable atherosclerosis. No recurrent pulmonary emboli identified on non dedicated imaging. 4. Grossly stable sclerotic lesions throughout the thoracic spine consistent with treated metastatic disease. . Electronically Signed   By: WRichardean SaleM.D.   On: 04/13/2015 15:46   Mr BJeri CosWKVContrast  03/18/2015  CLINICAL DATA:  69year old female with breast cancer treated with chemotherapy. No new symptoms. Subsequent encounter. EXAM: MRI HEAD WITHOUT AND WITH CONTRAST TECHNIQUE: Multiplanar, multiecho pulse sequences of the brain and surrounding structures were obtained without and with intravenous contrast. CONTRAST:  246mMULTIHANCE GADOBENATE DIMEGLUMINE 529 MG/ML IV SOLN COMPARISON:  01/07/2015 and 09/25/2014. FINDINGS: No brain parenchymal enhancing lesion. Calvarial enhancing lesions largest left frontal calvarium measures up to 1.2 cm is unchanged and may represent treated/ quiescent tumor. Mild progression of nonspecific patchy periventricular white matter type changes may reflect result of treatment of tumor and/or small vessel disease type changes. No acute infarct. No intracranial hemorrhage. No hydrocephalus. Major intracranial vascular structures are patent. Persistent opacification mastoid air cells and middle ear cavities much more notable on the left without obstructing mass identified.  Small pituitary gland unchanged. Cervical medullary junction, pineal region and orbital structures unremarkable. IMPRESSION: No brain parenchymal enhancing lesion. Calvarial enhancing lesions largest left frontal calvarium measures up to 1.2 cm is unchanged and may represent treated/ quiescent tumor. Mild progression of nonspecific patchy periventricular white matter type changes may reflect result of treatment of tumor and/or small vessel disease type changes. Persistent opacification mastoid air cells and middle ear cavities much more notable on the left without  obstructing mass identified. Electronically Signed   By: Genia Del M.D.   On: 03/18/2015 15:48    ASSESSMENT: 69 y.o.  [place] woman   (1)  status post bilateral breast biopsies 07/24/2011, showing,      (a) on the right, a clinical T2 N0 papillary/ductal breast cancer, estrogen and progesterone receptor positive, HER-2 negative, with an MIB-1 of 10%;     (b) on the left, a clinical T3 N1, stage IIIA invasive ductal carcinoma, grade 3, triple positive, with an MIB-1 of 60%.  (2)  Status post 4 dose dense cycles of doxorubicin/ cyclophosphamide followed by 4 dose dense cycles of paclitaxel and trastuzumab completed 12/09/2011  (3) the trastuzumab was continued for a total of one year (to 11/01/2012). Final echo on 11/04/2012 showing a well preserved ejection fraction of 55-60%.  (4) s/p bilateral mastectomies 01/21/2012 showing   (a) on the Right, an 8 mm invasive papillary carcinoma, grade 1, ypT1b ypN0   (b) on the Left, multiple microscopic foci of residual  Invasive ductal carcinoma with evidence of dermal lymphatic involvement, pyT1a/T4 pyN0  (5)  Postmastectomy radiation, completed 04/15/2012  (6) Started anastrazole 04/16/2012; normal dexa scan 05/19/2014 at the [facility] 09/11/2014: brain, bones, left effusion (7) CT angiogram 09/11/2014 shows new left pericardial effusion and new mediastinal  and hilar lymphadenopathy; there were no suspicious upper abdominal findings; Cytology from the left effusion 09/11/2014 ([id number]) shows malignant cells which are HER-2 positive  (8) Whole body bone scan on 09/25/14 showed metastatic foci throughout axial and appendicular skeleton. Areas of most potential concern include disease in the thoracic spine, disease in the right acetabulum, right superior pubic ramus and possibly proximal right femur, disease in the right humeral shaft and in both femoral shafts.  (8) Started trastuzumab/pertuzumab 09/26/2014, discontinued after 11/29/2014 dose, w progression  (a) T-DM1 started 01/02/2015  (b) echocardiogram 01/30/2015 shows a well preserved ejection fraction.  (9) Brain MRI on 09/25/14 was positive for numerous small enhancing brain mets and calvarium/bone mets   (a) whole brain radiation on 10/09/14--10/27/2014  (b) brain MRI 01/07/2015 shows a complete clinical response; REPEAT BRAIN mri 03/18/2015 SHOWS NO NEW LESIONS  (10) started zolendronate 11/29/2014, to be repeated every 12 weeks  11) left pleural effusion re-tapped 12/08/2014 (a) left chest tube placement 12/14/2014  (12) large pericardial effusion per echo 12/13/2014  (a) pericardial window placement 12/14/2014; fluid is hemorrhagic; path negative  (13) Right-sided pulmonary emboli noted on CT scan 12/13/2014  (a) no pulmonary emboli demonstrated on non-dedicated chest CT 04/13/2015  PLAN: Linda [name] is having an excellent response to the TDM 1, with essentially no evidence of active disease at this point.  She would like to drive and I think it is safe for her to do that. She has had no seizures, her pulmonary emboli appear to have resolved, she is not on pain medication, and there is no evidence of brain metastatic recurrence.  She is taking courses so shaking get to work with H and R [employer] and she is getting grades in the 80s and 90s. This argues against  cognitive dysfunction.  The overall plan is to continue T-DM 1 indefinitely, so long as there is no evidence of disease progression and so long as there is no end organ toxicity. You're going to continue to check call cardiograms every 3 months and the next one will be mid January. She will also have a brain MRI and chest x-ray mid January and then see me  the fourth week in January to discuss results  Linda [name] knows to call for any problems that may develop before her next visit here.  Chauncey Cruel, MD

## 2015-04-17 NOTE — Patient Instructions (Signed)
[facility] Discharge Instructions for Patients Receiving Chemotherapy  Today you received the following chemotherapy agents: Kadcyla.  To help prevent nausea and vomiting after your treatment, we encourage you to take your nausea medication.   If you develop nausea and vomiting that is not controlled by your nausea medication, call the clinic.   BELOW ARE SYMPTOMS THAT SHOULD BE REPORTED IMMEDIATELY:  *FEVER GREATER THAN 100.5 F  *CHILLS WITH OR WITHOUT FEVER  NAUSEA AND VOMITING THAT IS NOT CONTROLLED WITH YOUR NAUSEA MEDICATION  *UNUSUAL SHORTNESS OF BREATH  *UNUSUAL BRUISING OR BLEEDING  TENDERNESS IN MOUTH AND THROAT WITH OR WITHOUT PRESENCE OF ULCERS  *URINARY PROBLEMS  *BOWEL PROBLEMS  UNUSUAL RASH Items with * indicate a potential emergency and should be followed up as soon as possible.  Feel free to call the clinic you have any questions or concerns. The clinic phone number is [phone].  Please show the [place] at check-in to the Emergency Department and triage nurse.  Zoledronic Acid injection (Hypercalcemia, Oncology) What is this medicine? ZOLEDRONIC ACID (ZOE le dron ik AS id) lowers the amount of calcium loss from bone. It is used to treat too much calcium in your blood from cancer. It is also used to prevent complications of cancer that has spread to the bone. This medicine may be used for other purposes; ask your health care provider or pharmacist if you have questions. COMMON BRAND NAME(S): Zometa What should I tell my health care provider before I take this medicine? They need to know if you have any of these conditions: -aspirin-sensitive asthma -cancer, especially if you are receiving medicines used to treat cancer -dental disease or wear dentures -infection -kidney disease -receiving corticosteroids like dexamethasone or prednisone -an unusual or allergic reaction to zoledronic acid, other medicines, foods, dyes, or  preservatives -pregnant or trying to get pregnant -breast-feeding How should I use this medicine? This medicine is for infusion into a vein. It is given by a health care professional in a hospital or clinic setting. Talk to your pediatrician regarding the use of this medicine in children. Special care may be needed. Overdosage: If you think you have taken too much of this medicine contact a poison control center or emergency room at once. NOTE: This medicine is only for you. Do not share this medicine with others. What if I miss a dose? It is important not to miss your dose. Call your doctor or health care professional if you are unable to keep an appointment. What may interact with this medicine? -certain antibiotics given by injection -NSAIDs, medicines for pain and inflammation, like ibuprofen or naproxen -some diuretics like bumetanide, furosemide -teriparatide -thalidomide This list may not describe all possible interactions. Give your health care provider a list of all the medicines, herbs, non-prescription drugs, or dietary supplements you use. Also tell them if you smoke, drink alcohol, or use illegal drugs. Some items may interact with your medicine. What should I watch for while using this medicine? Visit your doctor or health care professional for regular checkups. It may be some time before you see the benefit from this medicine. Do not stop taking your medicine unless your doctor tells you to. Your doctor may order blood tests or other tests to see how you are doing. Women should inform their doctor if they wish to become pregnant or think they might be pregnant. There is a potential for serious side effects to an unborn child. Talk to your health care  professional or pharmacist for more information. You should make sure that you get enough calcium and vitamin D while you are taking this medicine. Discuss the foods you eat and the vitamins you take with your health care  professional. Some people who take this medicine have severe bone, joint, and/or muscle pain. This medicine may also increase your risk for jaw problems or a broken thigh bone. Tell your doctor right away if you have severe pain in your jaw, bones, joints, or muscles. Tell your doctor if you have any pain that does not go away or that gets worse. Tell your dentist and dental surgeon that you are taking this medicine. You should not have major dental surgery while on this medicine. See your dentist to have a dental exam and fix any dental problems before starting this medicine. Take good care of your teeth while on this medicine. Make sure you see your dentist for regular follow-up appointments. What side effects may I notice from receiving this medicine? Side effects that you should report to your doctor or health care professional as soon as possible: -allergic reactions like skin rash, itching or hives, swelling of the face, lips, or tongue -anxiety, confusion, or depression -breathing problems -changes in vision -eye pain -feeling faint or lightheaded, falls -jaw pain, especially after dental work -mouth sores -muscle cramps, stiffness, or weakness -trouble passing urine or change in the amount of urine Side effects that usually do not require medical attention (report to your doctor or health care professional if they continue or are bothersome): -bone, joint, or muscle pain -constipation -diarrhea -fever -hair loss -irritation at site where injected -loss of appetite -nausea, vomiting -stomach upset -trouble sleeping -trouble swallowing -weak or tired This list may not describe all possible side effects. Call your doctor for medical advice about side effects. You may report side effects to FDA at 1-800-FDA-1088. Where should I keep my medicine? This drug is given in a hospital or clinic and will not be stored at home. NOTE: This sheet is a summary. It may not cover all possible  information. If you have questions about this medicine, talk to your doctor, pharmacist, or health care provider.  2015, Elsevier/Gold Standard. (2012-11-11 13:03:13)

## 2015-04-17 NOTE — Telephone Encounter (Signed)
Appointments made and avs printed for patient °

## 2015-05-08 ENCOUNTER — Encounter: Payer: Self-pay | Admitting: Physician Assistant

## 2015-05-08 ENCOUNTER — Ambulatory Visit (HOSPITAL_BASED_OUTPATIENT_CLINIC_OR_DEPARTMENT_OTHER): Payer: Medicare Other | Admitting: Physician Assistant

## 2015-05-08 ENCOUNTER — Ambulatory Visit (HOSPITAL_BASED_OUTPATIENT_CLINIC_OR_DEPARTMENT_OTHER): Payer: Medicare Other

## 2015-05-08 ENCOUNTER — Telehealth: Payer: Self-pay | Admitting: Oncology

## 2015-05-08 ENCOUNTER — Other Ambulatory Visit (HOSPITAL_BASED_OUTPATIENT_CLINIC_OR_DEPARTMENT_OTHER): Payer: Medicare Other

## 2015-05-08 VITALS — BP 117/64 | HR 106 | Temp 97.7°F | Resp 18 | Ht 69.0 in | Wt 214.7 lb

## 2015-05-08 DIAGNOSIS — R0602 Shortness of breath: Secondary | ICD-10-CM | POA: Diagnosis not present

## 2015-05-08 DIAGNOSIS — R609 Edema, unspecified: Secondary | ICD-10-CM

## 2015-05-08 DIAGNOSIS — C50911 Malignant neoplasm of unspecified site of right female breast: Secondary | ICD-10-CM

## 2015-05-08 DIAGNOSIS — J910 Malignant pleural effusion: Secondary | ICD-10-CM | POA: Diagnosis not present

## 2015-05-08 DIAGNOSIS — Z5112 Encounter for antineoplastic immunotherapy: Secondary | ICD-10-CM | POA: Diagnosis present

## 2015-05-08 DIAGNOSIS — R601 Generalized edema: Secondary | ICD-10-CM

## 2015-05-08 DIAGNOSIS — M7989 Other specified soft tissue disorders: Secondary | ICD-10-CM

## 2015-05-08 DIAGNOSIS — C50912 Malignant neoplasm of unspecified site of left female breast: Secondary | ICD-10-CM

## 2015-05-08 DIAGNOSIS — C50812 Malignant neoplasm of overlapping sites of left female breast: Secondary | ICD-10-CM | POA: Diagnosis not present

## 2015-05-08 DIAGNOSIS — C50811 Malignant neoplasm of overlapping sites of right female breast: Secondary | ICD-10-CM | POA: Diagnosis not present

## 2015-05-08 LAB — COMPREHENSIVE METABOLIC PANEL (CC13)
ALBUMIN: 3.4 g/dL — AB (ref 3.5–5.0)
ALK PHOS: 76 U/L (ref 40–150)
ALT: 16 U/L (ref 0–55)
ANION GAP: 11 meq/L (ref 3–11)
AST: 26 U/L (ref 5–34)
BILIRUBIN TOTAL: 0.42 mg/dL (ref 0.20–1.20)
BUN: 12.5 mg/dL (ref 7.0–26.0)
CO2: 23 mEq/L (ref 22–29)
CREATININE: 0.8 mg/dL (ref 0.6–1.1)
Calcium: 9.7 mg/dL (ref 8.4–10.4)
Chloride: 105 mEq/L (ref 98–109)
EGFR: 78 mL/min/{1.73_m2} — AB (ref >90)
GLUCOSE: 130 mg/dL (ref 70–140)
Potassium: 3.9 mEq/L (ref 3.5–5.1)
SODIUM: 139 meq/L (ref 136–145)
TOTAL PROTEIN: 7.6 g/dL (ref 6.4–8.3)

## 2015-05-08 LAB — CBC WITH DIFFERENTIAL/PLATELET
BASO%: 0.4 % (ref 0.0–2.0)
Basophils Absolute: 0 10*3/uL (ref 0.0–0.1)
EOS ABS: 0.1 10*3/uL (ref 0.0–0.5)
EOS%: 1.6 % (ref 0.0–7.0)
HCT: 38 % (ref 34.8–46.6)
HEMOGLOBIN: 12.4 g/dL (ref 11.6–15.9)
LYMPH%: 23.4 % (ref 14.0–49.7)
MCH: 30.5 pg (ref 25.1–34.0)
MCHC: 32.6 g/dL (ref 31.5–36.0)
MCV: 93.6 fL (ref 79.5–101.0)
MONO#: 0.7 10*3/uL (ref 0.1–0.9)
MONO%: 12.5 % (ref 0.0–14.0)
NEUT%: 62.1 % (ref 38.4–76.8)
NEUTROS ABS: 3.5 10*3/uL (ref 1.5–6.5)
PLATELETS: 198 10*3/uL (ref 145–400)
RBC: 4.06 10*6/uL (ref 3.70–5.45)
RDW: 14.5 % (ref 11.2–14.5)
WBC: 5.6 10*3/uL (ref 3.9–10.3)
lymph#: 1.3 10*3/uL (ref 0.9–3.3)

## 2015-05-08 MED ORDER — ACETAMINOPHEN 325 MG PO TABS
ORAL_TABLET | ORAL | Status: AC
  Filled 2015-05-08: qty 2

## 2015-05-08 MED ORDER — SODIUM CHLORIDE 0.9 % IJ SOLN
10.0000 mL | INTRAMUSCULAR | Status: DC | PRN
  Administered 2015-05-08: 10 mL
  Filled 2015-05-08: qty 10

## 2015-05-08 MED ORDER — ACETAMINOPHEN 325 MG PO TABS
650.0000 mg | ORAL_TABLET | Freq: Once | ORAL | Status: AC
  Administered 2015-05-08: 650 mg via ORAL

## 2015-05-08 MED ORDER — HEPARIN SOD (PORK) LOCK FLUSH 100 UNIT/ML IV SOLN
500.0000 [IU] | Freq: Once | INTRAVENOUS | Status: AC | PRN
  Administered 2015-05-08: 500 [IU]
  Filled 2015-05-08: qty 5

## 2015-05-08 MED ORDER — ANASTROZOLE 1 MG PO TABS: 1.0000 mg | ORAL_TABLET | Freq: Every day | ORAL | Status: DC

## 2015-05-08 MED ORDER — SODIUM CHLORIDE 0.9 % IV SOLN
Freq: Once | INTRAVENOUS | Status: AC
  Administered 2015-05-08: 11:00:00 via INTRAVENOUS

## 2015-05-08 MED ORDER — DIPHENHYDRAMINE HCL 25 MG PO CAPS
ORAL_CAPSULE | ORAL | Status: AC
  Filled 2015-05-08: qty 1

## 2015-05-08 MED ORDER — ADO-TRASTUZUMAB EMTANSINE CHEMO INJECTION 160 MG
3.5000 mg/kg | Freq: Once | INTRAVENOUS | Status: AC
  Administered 2015-05-08: 360 mg via INTRAVENOUS
  Filled 2015-05-08: qty 10

## 2015-05-08 MED ORDER — DIPHENHYDRAMINE HCL 25 MG PO CAPS
25.0000 mg | ORAL_CAPSULE | Freq: Once | ORAL | Status: AC
  Administered 2015-05-08: 25 mg via ORAL

## 2015-05-08 MED ORDER — FUROSEMIDE 20 MG PO TABS: 20.0000 mg | ORAL_TABLET | ORAL | Status: DC

## 2015-05-08 NOTE — Progress Notes (Signed)
ID: AMRY CATHY   DOB: 1946/04/29  MR#: [id number]  XBD#:[id number]  PCP: Leeanne Rio, PA-C GYN: SU: Coralie Keens OTHER MD: Crissie Reese, Linna Hoff Bensimhon  CHIEF COMPLAINT:  Bilateral Breast Cancers  CURRENT TREATMENT: Anastrozole  BREAST CANCER HISTORY: From the original intake note:  The patient noted a small amount of drainage from her left breast December of 2012. She brought it to her gynecologist's attention in January of 2013 and was set up for bilateral diagnostic mammography at the breast Center July 24, 2011. This was the patient's first ever mammogram. It showed a large irregular mass in the left retroareolar region extending to the nipple, with nipple retraction and skin thickening. This measured approximately 8.4 cm. It was associated with pleomorphic microcalcifications. By exam there was moderate distortion and retraction of the nipple with a large palpable ill-defined area in the retroareolar region. In the right right breast there was also a 2 cm hard mass palpated. Ultrasound of the right breast mass showed a complex cystic/solid area measuring 1.9 cm. Ultrasound of the right axilla was negative. Ultrasound of the left breast showed a large hypoechoic mass measuring at least 3.8 cm. The left axilla showed several adjacent abnormal appearing lymph nodes.  With this information biopsy of the right and left breast masses were obtained the same day, and showed ([id number])   (a) on the right, and invasive ductal carcinoma with papillary features, estrogen and progesterone receptor positive, HER-2 negative, with an MIB-1 of 10%.   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 The MIB-1 was 60% for the left-sided tumor.   With this information the patient was presented at the multidisciplinary breast cancer conference 08/06/2011. Subsequent evaluation and treatments  are as detailed below.  INTERVAL HISTORY: Linda [name] returns today for follow up of her metastatic breast cancer, accompanied by her friend Butch Penny. Today she is due for cycle 6 of T-DM 1, given every 21 days. She tolerates the treatment with no side effects whatsoever that she is aware of. In particular she denies any rash,fever,fatigue or any problems with her port. She is driving without any complications  REVIEW OF SYSTEMS: Linda [name] had a fall about 4 years ago but has had no further problems regarding that. She denies headaches, she does have some blurred vision and tells me she needs to get new glasses. She's had no nausea or vomiting problems. She is taking classes with [employer] and is trying to get back to work in January. She cleans her house and goes to church on Sunday, but does not: Walks on a regular basis. Aside from the symptoms described above a detailed review of systems today was noncontributory.  PAST MEDICAL HISTORY: Past Medical History  Diagnosis Date  . Cancer ([place]) 08-13-11    07-31-11-Dx. Bilarteral Breast cancer-left greater than rt.  . Hematuria, undiagnosed cause 08-13-11    Being evaluated by [facility] 08-14-11  . Hypertension   . Bronchitis     hx  . GERD (gastroesophageal reflux disease)     doing well  . Breast cancer ([place]) 07/30/11 dx    Right  invasive ductal ca 7 0'clock,& left breast=invasive ductal ca  and dcis, left axilla nlymph node, metastatic ca  . Seroma 02/04/12    right breast  200cc removed  erythema on right side  . Seasonal allergies   . History of radiation therapy 02/20/12-04/15/12  left breast,total 60.4 Gy  . Wears partial dentures     wears upper and lower partial  . Allergy   . Blood transfusion without reported diagnosis   . 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   . Cancer of central portion of left female breast ([place]) 08/01/2011  . S/P radiation therapy 10/09/14-10/27/14    whole brain  37.5Gy/17f  . Acute pulmonary embolism ([place] 12/13/2014  . Acute on chronic diastolic CHF (congestive heart failure) [place] [place]     PAST SURGICAL HISTORY: Past Surgical History  Procedure Laterality Date  . Child birth  08-13-11    x3 -NVD  . Portacath placement  08/15/2011    Procedure: INSERTION PORT-A-CATH;  Surgeon: DHarl Bowie MD;  [facility];  Service: General;  Laterality: N/A;  . Mastectomy w/ sentinel node biopsy  01/21/2012    Procedure: MASTECTOMY WITH SENTINEL LYMPH NODE BIOPSY;  Surgeon: DHarl Bowie MD;  [facility]  Service: General;  Laterality: Bilateral;  Left modified radical mastectomy, Rt mastectomy with Sentinel lymphnode biospy  . Breast surgery    . Port-a-cath removal Right 12/22/2012    Procedure: REMOVAL PORT-A-CATH;  Surgeon: DHarl Bowie MD;  [facility]  Service: General;  Laterality: Right;  . Subxyphoid pericardial window N/A 12/14/2014    Procedure: SUBXYPHOID PERICARDIAL WINDOW;  Surgeon: CRexene Alberts MD;  [facility]  Service: Thoracic;  Laterality: N/A;  . Tee without cardioversion N/A 12/14/2014    Procedure: TRANSESOPHAGEAL ECHOCARDIOGRAM (TEE);  Surgeon: CRexene Alberts MD;  [facility]  Service: Thoracic;  Laterality: N/A;  . Chest tube insertion Left 12/14/2014    Procedure: CHEST TUBE INSERTION;  Surgeon: CRexene Alberts MD;  [facility];  Service: Thoracic;  Laterality: Left;    FAMILY HISTORY Family History  Problem Relation Age of Onset  . Heart disease Mother   . Cancer Mother 456   breast, , 740deceased  . Heart attack Father   . Cancer Sister 674   breast  . Colon cancer Neg Hx   The patient's father died from a heart attack at the age of 83 The patient's mother died from apparently heart problems at the age of 837 The patient had no brothers. She has 3 sisters. One of her sisters was diagnosed with breast cancer in her mid 621s The patient does not  know if his sister was ever genetically tested. The patient's mother had a mastectomy at the age of 347 presumably for breast cancer. There is no other history of breast or in cancer in the family to her knowledge.   GYNECOLOGIC HISTORY:  She does not recall when she had menarche. She had her first child at age 69 She is GX P3. She underwent menopause in her mid 461s She never took hormone replacement.   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 [place] a cGames developer[place] Son CGerald [place]ab[place]ives [place] works as a fAirline pilot His wife is a nMarine scientist Son DShanon Browlives at WB[employer]and is a dAdvertising account executivein addition to having the mTenneco Inc The patient attends a local BLehman Brothershere   ADVANCED DIRECTIVES: Not in place. At the clinic visit 05/08/2015 the patient  was given the appropriate forms to complete and notarize at her discretion. She tells me she ihas named her daughter-in-law Jacqlyn Larsen as her healthcare power of attorney  HEALTH MAINTENANCE: Social History  Substance Use Topics  . Smoking status: Never Smoker   . Smokeless tobacco: Never Used  . Alcohol Use: 0.0 oz/week    0 Standard drinks or equivalent per week     Comment: rare- occ.     Colonoscopy: Never  PAP: Feb 2013  Bone density: November 2013, normal  Lipid panel:   Allergies  Allergen Reactions  . Ace Inhibitors Cough    Current Outpatient Prescriptions  Medication Sig Dispense Refill  . anastrozole (ARIMIDEX) 1 MG tablet Take 1 tablet (1 mg total) by mouth daily. 30 tablet 5  . b complex vitamins tablet Take 1 tablet by mouth daily.    . cholecalciferol (VITAMIN D) 1000 UNITS tablet Take 1 tablet (1,000 Units total) by mouth daily. 100 tablet 12  . furosemide (LASIX) 20 MG tablet Take 1 tablet (20 mg total) by mouth every other  day. 30 tablet 0  . magnesium oxide (MAG-OX) 400 (241.3 MG) MG tablet Take 1 tablet (400 mg total) by mouth daily. 30 tablet 0  . Multiple Vitamins-Iron (MULTIVITAMINS WITH IRON) TABS Take 1 tablet by mouth daily.    . potassium chloride SA (K-DUR,KLOR-CON) 20 MEQ tablet TAKE 2 TABLETS BY MOUTH DAILY (Patient taking differently: TAKE 1 TABLETS BY MOUTH DAILY) 30 tablet 3   No current facility-administered medications for this visit.   Facility-Administered Medications Ordered in Other Visits  Medication Dose Route Frequency Provider Last Rate Last Dose  . ado-trastuzumab emtansine (KADCYLA) 360 mg in sodium chloride 0.9 % 250 mL chemo infusion  3.5 mg/kg (Treatment Plan Actual) Intravenous Once Chauncey Cruel, MD 536 mL/hr at 05/08/15 1207 360 mg at 05/08/15 1207  . heparin lock flush 100 unit/mL  500 Units Intracatheter Once PRN Chauncey Cruel, MD      . sodium chloride 0.9 % injection 10 mL  10 mL Intracatheter PRN Chauncey Cruel, MD        OBJECTIVE: [name] who appears stated age 64 Vitals:   05/08/15 1019  BP: 117/64  Pulse: 106  Temp: 97.7 F (36.5 C)  Resp: 18     Body mass index is 31.69 kg/(m^2).    ECOG FS: 1 Filed Weights   05/08/15 1019  Weight: 214 lb 11.2 oz (97.387 kg)   Sclerae unicteric, pupils round and equal Oropharynx clear and moist-- no thrush or other lesions No cervical or supraclavicular adenopathy Lungs no rales or rhonchi Heart regular rate and rhythm Abd soft, nontender, positive bowel sounds MSK no focal spinal tenderness, no upper extremity lymphedema Neuro: nonfocal, well oriented 3, normal short-term memory, appropriate affect Breasts: Status post bilateral mastectomies. There is no evidence of chest wall recurrence. Both axillae are benign.     LAB RESULTS: CBC Latest Ref Rng 05/08/2015 04/17/2015 03/28/2015  WBC 3.9 - 10.3 10e3/uL 5.6 6.5 6.7  Hemoglobin 11.6 - 15.9 g/dL 12.4 12.3 12.2  Hematocrit 34.8 - 46.6 %  38.0 37.7 37.3  Platelets 145 - 400 10e3/uL 198 221 231   CMP Latest Ref Rng 05/08/2015 04/17/2015 03/28/2015  Glucose 70 - 140 mg/dl 130 105 109  BUN 7.0 - 26.0 mg/dL 12.5 11.8 11.0  Creatinine 0.6 - 1.1 mg/dL 0.8 0.7 0.7  Sodium 136 - 145 mEq/L 139 141 140  Potassium 3.5 - 5.1 mEq/L 3.9 4.0  3.9  Chloride 101 - 111 mmol/L - - -  CO2 22 - 29 mEq/L _0 Calcium 8.4 - 10.4 mg/dL 9.7 10.0 9.6  Total Protein 6.4 - 8.3 g/dL 7.6 7.9 7.7  Total Bilirubin 0.20 - 1.20 mg/dL 0.42 0.39 0.42  Alkaline Phos 40 - 150 U/L 76 68 75  AST 5 - 34 U/L _1 ALT 0 - 55 U/L _2 STUDIES: .Ct Chest W Contrast  04/13/2015  CLINICAL DATA:  Restaging bilateral breast cancer status post bilateral mastectomy in 2013. No current complaints. Subsequent encounter. EXAM: CT CHEST WITH CONTRAST TECHNIQUE: Multidetector CT imaging of the chest was performed during intravenous contrast administration. CONTRAST:  70m OMNIPAQUE IOHEXOL 300 MG/ML  SOLN COMPARISON:  Radiographs 01/01/2015.  CT 12/13/2014. FINDINGS: Mediastinum/Nodes: There are no enlarged mediastinal, hilar or axillary lymph nodes.Patient is status post bilateral mastectomy and axillary node dissection. The thyroid gland, esophagus and trachea demonstrate no significant findings. The heart size is stable. The previously demonstrated pericardial effusion has resolved. No recurrent pulmonary emboli demonstrated on non dedicated imaging. There is stable mild atherosclerosis of the aorta, great vessels and coronary arteries. Right IJ Port-A-Cath tip extends to SVC right atrial junction. Lungs/Pleura: Bilateral pleural effusions have resolved. There is significant improvement in the pulmonary aeration. Mild linear scarring remains anteriorly in the left upper lobe, likely influenced by previous radiation therapy. No confluent airspace opacity, endobronchial lesion or suspicious pulmonary nodule. Upper abdomen: Gas containing stones are noted within the  gallbladder lumen. The visualized liver and adrenal glands appear unremarkable. Musculoskeletal/Chest wall: Grossly stable multifocal sclerosis throughout the spine most consistent with treated metastatic disease. This sclerosis is most prominent within the T4 and T5 vertebral bodies. No evidence of pathologic fracture or epidural tumor. No evidence of chest wall mass. IMPRESSION: 1. Interval resolution of bilateral pleural and pericardial effusions with significantly improved aeration of the lungs. 2. No evidence of local recurrence or progressive metastatic disease. 3. Stable atherosclerosis. No recurrent pulmonary emboli identified on non dedicated imaging. 4. Grossly stable sclerotic lesions throughout the thoracic spine consistent with treated metastatic disease. . Electronically Signed   By: WRichardean SaleM.D.   On: 04/13/2015 15:46    ASSESSMENT: 69y.o.  [place] woman   (1)  status post bilateral breast biopsies 07/24/2011, showing,      (a) on the right, a clinical T2 N0 papillary/ductal breast cancer, estrogen and progesterone receptor positive, HER-2 negative, with an MIB-1 of 10%;     (b) on the left, a clinical T3 N1, stage IIIA invasive ductal carcinoma, grade 3, triple positive, with an MIB-1 of 60%.  (2)  Status post 4 dose dense cycles of doxorubicin/ cyclophosphamide followed by 4 dose dense cycles of paclitaxel and trastuzumab completed 12/09/2011  (3) the trastuzumab was continued for a total of one year (to 11/01/2012). Final echo on 11/04/2012 showing a well preserved ejection fraction of 55-60%.  (4) s/p bilateral mastectomies 01/21/2012 showing   (a) on the Right, an 8 mm invasive papillary carcinoma, grade 1, ypT1b ypN0   (b) on the Left, multiple microscopic foci of residual  Invasive ductal carcinoma with evidence of dermal lymphatic involvement, pyT1a/T4 pyN0  (5)  Postmastectomy radiation, completed 04/15/2012  (6) Started anastrazole 04/16/2012; [facility]  [facility] brain, bones, left effusion (7) CT angiogram 09/11/2014 shows new left pericardial effusion and new mediastinal and hilar lymphadenopathy; there were no  suspicious upper abdominal findings; Cytology from the left effusion 09/11/2014 ([id number]) shows malignant cells which are HER-2 positive  (8) Whole body bone scan on 09/25/14 showed metastatic foci throughout axial and appendicular skeleton. Areas of most potential concern include disease in the thoracic spine, disease in the right acetabulum, right superior pubic ramus and possibly proximal right femur, disease in the right humeral shaft and in both femoral shafts.  (8) Started trastuzumab/pertuzumab 09/26/2014, discontinued after 11/29/2014 dose, w progression  (a) T-DM1 started 01/02/2015  (b) echocardiogram 01/30/2015 shows a well preserved ejection fraction.  (9) Brain MRI on 09/25/14 was positive for numerous small enhancing brain mets and calvarium/bone mets   (a) whole brain radiation on 10/09/14--10/27/2014  (b) brain MRI 01/07/2015 shows a complete clinical response; REPEAT BRAIN mri 03/18/2015 SHOWS NO NEW LESIONS  (10) started zolendronate 11/29/2014, to be repeated every 12 weeks  11) left pleural effusion re-tapped 12/08/2014 (a) left chest tube placement 12/14/2014  (12) large pericardial effusion per echo 12/13/2014  (a) pericardial window placement 12/14/2014; fluid is hemorrhagic; path negative  (13) Right-sided pulmonary emboli noted on CT scan 12/13/2014  (a) no pulmonary emboli demonstrated on non-dedicated chest CT 04/13/2015  PLAN: Linda [name] is having an excellent response to the TDM 1, with essentially no evidence of active disease at this point.  She is taking courses so shaking get to work with H [employer] R [employer] and she is getting grades in the 80s and 90s. This argues against cognitive dysfunction.  The overall plan is to  continue T-DM 1 indefinitely, so long as there is no evidence of disease progression and so long as there is no end organ toxicity.Will continue to check echocardiograms every 3 months and the next one will be mid January. She will also have a brain MRI and chest x-ray mid January and then see Dr. Jana Hakim the fourth week in January to discuss results  A Refill for Lasix and Arimidex have been issued without modification of dose.  Linda [name] knows to call for any problems that may develop before her next visit here.  Sharene Butters E, PA-C

## 2015-05-08 NOTE — Telephone Encounter (Signed)
Appointments moved per patient request and avs printed

## 2015-05-08 NOTE — Patient Instructions (Signed)
[facility] Discharge Instructions for Patients Receiving Chemotherapy  Today you received the following chemotherapy agents kadcycla  To help prevent nausea and vomiting after your treatment, we encourage you to take your nausea medication  If you develop nausea and vomiting that is not controlled by your nausea medication, call the clinic.   BELOW ARE SYMPTOMS THAT SHOULD BE REPORTED IMMEDIATELY:  *FEVER GREATER THAN 100.5 F  *CHILLS WITH OR WITHOUT FEVER  NAUSEA AND VOMITING THAT IS NOT CONTROLLED WITH YOUR NAUSEA MEDICATION  *UNUSUAL SHORTNESS OF BREATH  *UNUSUAL BRUISING OR BLEEDING  TENDERNESS IN MOUTH AND THROAT WITH OR WITHOUT PRESENCE OF ULCERS  *URINARY PROBLEMS  *BOWEL PROBLEMS  UNUSUAL RASH Items with * indicate a potential emergency and should be followed up as soon as possible.  Feel free to call the clinic you have any questions or concerns. The clinic phone number is [phone].  Please show the CHEMO ALERT CARD at check-in to the Emergency Department and triage nurse.   

## 2015-05-28 ENCOUNTER — Encounter: Payer: Self-pay | Admitting: Nurse Practitioner

## 2015-05-28 ENCOUNTER — Ambulatory Visit (HOSPITAL_BASED_OUTPATIENT_CLINIC_OR_DEPARTMENT_OTHER): Payer: Medicare Other | Admitting: Nurse Practitioner

## 2015-05-28 ENCOUNTER — Other Ambulatory Visit (HOSPITAL_BASED_OUTPATIENT_CLINIC_OR_DEPARTMENT_OTHER): Payer: Medicare Other

## 2015-05-28 ENCOUNTER — Ambulatory Visit (HOSPITAL_BASED_OUTPATIENT_CLINIC_OR_DEPARTMENT_OTHER): Payer: Medicare Other

## 2015-05-28 VITALS — BP 150/68 | HR 97 | Temp 97.7°F | Resp 19 | Ht 69.0 in | Wt 216.2 lb

## 2015-05-28 DIAGNOSIS — C50911 Malignant neoplasm of unspecified site of right female breast: Secondary | ICD-10-CM

## 2015-05-28 DIAGNOSIS — C7931 Secondary malignant neoplasm of brain: Secondary | ICD-10-CM

## 2015-05-28 DIAGNOSIS — Z170 Estrogen receptor positive status [ER+]: Secondary | ICD-10-CM | POA: Diagnosis not present

## 2015-05-28 DIAGNOSIS — C50812 Malignant neoplasm of overlapping sites of left female breast: Secondary | ICD-10-CM

## 2015-05-28 DIAGNOSIS — Z5112 Encounter for antineoplastic immunotherapy: Secondary | ICD-10-CM

## 2015-05-28 DIAGNOSIS — C50912 Malignant neoplasm of unspecified site of left female breast: Secondary | ICD-10-CM

## 2015-05-28 DIAGNOSIS — I313 Pericardial effusion (noninflammatory): Secondary | ICD-10-CM | POA: Diagnosis not present

## 2015-05-28 DIAGNOSIS — Z86711 Personal history of pulmonary embolism: Secondary | ICD-10-CM | POA: Diagnosis not present

## 2015-05-28 DIAGNOSIS — J910 Malignant pleural effusion: Secondary | ICD-10-CM

## 2015-05-28 DIAGNOSIS — C50212 Malignant neoplasm of upper-inner quadrant of left female breast: Principal | ICD-10-CM

## 2015-05-28 DIAGNOSIS — C7951 Secondary malignant neoplasm of bone: Secondary | ICD-10-CM

## 2015-05-28 DIAGNOSIS — C50211 Malignant neoplasm of upper-inner quadrant of right female breast: Secondary | ICD-10-CM

## 2015-05-28 DIAGNOSIS — C50811 Malignant neoplasm of overlapping sites of right female breast: Secondary | ICD-10-CM

## 2015-05-28 LAB — CBC WITH DIFFERENTIAL/PLATELET
BASO%: 0.5 % (ref 0.0–2.0)
BASOS ABS: 0 10*3/uL (ref 0.0–0.1)
EOS ABS: 0.1 10*3/uL (ref 0.0–0.5)
EOS%: 2 % (ref 0.0–7.0)
HCT: 37.4 % (ref 34.8–46.6)
HGB: 12.3 g/dL (ref 11.6–15.9)
LYMPH%: 27.8 % (ref 14.0–49.7)
MCH: 29.8 pg (ref 25.1–34.0)
MCHC: 32.8 g/dL (ref 31.5–36.0)
MCV: 90.8 fL (ref 79.5–101.0)
MONO#: 0.6 10*3/uL (ref 0.1–0.9)
MONO%: 9.6 % (ref 0.0–14.0)
NEUT%: 60.1 % (ref 38.4–76.8)
NEUTROS ABS: 3.9 10*3/uL (ref 1.5–6.5)
PLATELETS: 192 10*3/uL (ref 145–400)
RBC: 4.12 10*6/uL (ref 3.70–5.45)
RDW: 14.5 % (ref 11.2–14.5)
WBC: 6.5 10*3/uL (ref 3.9–10.3)
lymph#: 1.8 10*3/uL (ref 0.9–3.3)

## 2015-05-28 LAB — COMPREHENSIVE METABOLIC PANEL
ALBUMIN: 3.3 g/dL — AB (ref 3.5–5.0)
ALK PHOS: 79 U/L (ref 40–150)
ALT: 16 U/L (ref 0–55)
AST: 25 U/L (ref 5–34)
Anion Gap: 8 mEq/L (ref 3–11)
BUN: 13.3 mg/dL (ref 7.0–26.0)
CHLORIDE: 105 meq/L (ref 98–109)
CO2: 27 meq/L (ref 22–29)
Calcium: 9.9 mg/dL (ref 8.4–10.4)
Creatinine: 0.8 mg/dL (ref 0.6–1.1)
EGFR: 81 mL/min/{1.73_m2} — AB (ref >90)
Glucose: 118 mg/dl (ref 70–140)
POTASSIUM: 3.8 meq/L (ref 3.5–5.1)
Sodium: 140 mEq/L (ref 136–145)
Total Bilirubin: 0.3 mg/dL (ref 0.20–1.20)
Total Protein: 7.8 g/dL (ref 6.4–8.3)

## 2015-05-28 MED ORDER — ZOLEDRONIC ACID 4 MG/100ML IV SOLN
4.0000 mg | Freq: Once | INTRAVENOUS | Status: AC
  Administered 2015-05-28: 4 mg via INTRAVENOUS
  Filled 2015-05-28: qty 100

## 2015-05-28 MED ORDER — ACETAMINOPHEN 325 MG PO TABS
650.0000 mg | ORAL_TABLET | Freq: Once | ORAL | Status: AC
  Administered 2015-05-28: 650 mg via ORAL

## 2015-05-28 MED ORDER — DIPHENHYDRAMINE HCL 25 MG PO CAPS
ORAL_CAPSULE | ORAL | Status: AC
  Filled 2015-05-28: qty 1

## 2015-05-28 MED ORDER — SODIUM CHLORIDE 0.9 % IJ SOLN
10.0000 mL | INTRAMUSCULAR | Status: DC | PRN
  Administered 2015-05-28: 10 mL
  Filled 2015-05-28: qty 10

## 2015-05-28 MED ORDER — SODIUM CHLORIDE 0.9 % IV SOLN
Freq: Once | INTRAVENOUS | Status: AC
  Administered 2015-05-28: 15:00:00 via INTRAVENOUS

## 2015-05-28 MED ORDER — HEPARIN SOD (PORK) LOCK FLUSH 100 UNIT/ML IV SOLN
500.0000 [IU] | Freq: Once | INTRAVENOUS | Status: AC | PRN
  Administered 2015-05-28: 500 [IU]
  Filled 2015-05-28: qty 5

## 2015-05-28 MED ORDER — ADO-TRASTUZUMAB EMTANSINE CHEMO INJECTION 160 MG
3.5000 mg/kg | Freq: Once | INTRAVENOUS | Status: AC
  Administered 2015-05-28: 360 mg via INTRAVENOUS
  Filled 2015-05-28: qty 18

## 2015-05-28 MED ORDER — ACETAMINOPHEN 325 MG PO TABS
ORAL_TABLET | ORAL | Status: AC
  Filled 2015-05-28: qty 2

## 2015-05-28 MED ORDER — DIPHENHYDRAMINE HCL 25 MG PO CAPS
25.0000 mg | ORAL_CAPSULE | Freq: Once | ORAL | Status: AC
  Administered 2015-05-28: 25 mg via ORAL

## 2015-05-28 NOTE — Patient Instructions (Addendum)
[facility] Discharge Instructions for Patients Receiving Chemotherapy  Today you received the following chemotherapy agents kadcycla  To help prevent nausea and vomiting after your treatment, we encourage you to take your nausea medication    If you develop nausea and vomiting that is not controlled by your nausea medication, call the clinic.   BELOW ARE SYMPTOMS THAT SHOULD BE REPORTED IMMEDIATELY:  *FEVER GREATER THAN 100.5 F  *CHILLS WITH OR WITHOUT FEVER  NAUSEA AND VOMITING THAT IS NOT CONTROLLED WITH YOUR NAUSEA MEDICATION  *UNUSUAL SHORTNESS OF BREATH  *UNUSUAL BRUISING OR BLEEDING  TENDERNESS IN MOUTH AND THROAT WITH OR WITHOUT PRESENCE OF ULCERS  *URINARY PROBLEMS  *BOWEL PROBLEMS  UNUSUAL RASH Items with * indicate a potential emergency and should be followed up as soon as possible.  Feel free to call the clinic you have any questions or concerns. The clinic phone number is [phone].  Please show the [place] at check-in to the Emergency Department and triage nurse.  Zoledronic Acid injection (Hypercalcemia, Oncology) What is this medicine? ZOLEDRONIC ACID (ZOE le dron ik AS id) lowers the amount of calcium loss from bone. It is used to treat too much calcium in your blood from cancer. It is also used to prevent complications of cancer that has spread to the bone. This medicine may be used for other purposes; ask your health care provider or pharmacist if you have questions. What should I tell my health care provider before I take this medicine? They need to know if you have any of these conditions: -aspirin-sensitive asthma -cancer, especially if you are receiving medicines used to treat cancer -dental disease or wear dentures -infection -kidney disease -receiving corticosteroids like dexamethasone or prednisone -an unusual or allergic reaction to zoledronic acid, other medicines, foods, dyes, or preservatives -pregnant or trying  to get pregnant -breast-feeding How should I use this medicine? This medicine is for infusion into a vein. It is given by a health care professional in a hospital or clinic setting. Talk to your pediatrician regarding the use of this medicine in children. Special care may be needed. Overdosage: If you think you have taken too much of this medicine contact a poison control center or emergency room at once. NOTE: This medicine is only for you. Do not share this medicine with others. What if I miss a dose? It is important not to miss your dose. Call your doctor or health care professional if you are unable to keep an appointment. What may interact with this medicine? -certain antibiotics given by injection -NSAIDs, medicines for pain and inflammation, like ibuprofen or naproxen -some diuretics like bumetanide, furosemide -teriparatide -thalidomide This list may not describe all possible interactions. Give your health care provider a list of all the medicines, herbs, non-prescription drugs, or dietary supplements you use. Also tell them if you smoke, drink alcohol, or use illegal drugs. Some items may interact with your medicine. What should I watch for while using this medicine? Visit your doctor or health care professional for regular checkups. It may be some time before you see the benefit from this medicine. Do not stop taking your medicine unless your doctor tells you to. Your doctor may order blood tests or other tests to see how you are doing. Women should inform their doctor if they wish to become pregnant or think they might be pregnant. There is a potential for serious side effects to an unborn child. Talk to your health care professional or pharmacist  for more information. You should make sure that you get enough calcium and vitamin D while you are taking this medicine. Discuss the foods you eat and the vitamins you take with your health care professional. Some people who take this  medicine have severe bone, joint, and/or muscle pain. This medicine may also increase your risk for jaw problems or a broken thigh bone. Tell your doctor right away if you have severe pain in your jaw, bones, joints, or muscles. Tell your doctor if you have any pain that does not go away or that gets worse. Tell your dentist and dental surgeon that you are taking this medicine. You should not have major dental surgery while on this medicine. See your dentist to have a dental exam and fix any dental problems before starting this medicine. Take good care of your teeth while on this medicine. Make sure you see your dentist for regular follow-up appointments. What side effects may I notice from receiving this medicine? Side effects that you should report to your doctor or health care professional as soon as possible: -allergic reactions like skin rash, itching or hives, swelling of the face, lips, or tongue -anxiety, confusion, or depression -breathing problems -changes in vision -eye pain -feeling faint or lightheaded, falls -jaw pain, especially after dental work -mouth sores -muscle cramps, stiffness, or weakness -redness, blistering, peeling or loosening of the skin, including inside the mouth -trouble passing urine or change in the amount of urine Side effects that usually do not require medical attention (report to your doctor or health care professional if they continue or are bothersome): -bone, joint, or muscle pain -constipation -diarrhea -fever -hair loss -irritation at site where injected -loss of appetite -nausea, vomiting -stomach upset -trouble sleeping -trouble swallowing -weak or tired This list may not describe all possible side effects. Call your doctor for medical advice about side effects. You may report side effects to FDA at 1-800-FDA-1088. Where should I keep my medicine? This drug is given in a hospital or clinic and will not be stored at home. NOTE: This sheet is  a summary. It may not cover all possible information. If you have questions about this medicine, talk to your doctor, pharmacist, or health care provider.    2016, Elsevier/Gold Standard. (2013-10-29 14:19:39)

## 2015-05-28 NOTE — Progress Notes (Signed)
Pt refused to stay for 30 minute post observation after Kadcyla

## 2015-05-28 NOTE — Progress Notes (Signed)
ID: ZALEA PETE   DOB: 07/02/1945  MR#: [id number]  JJO#:[id number]  PCP: Leeanne Rio, PA-C GYN: SU: Coralie Keens OTHER MD: Crissie Reese, Linna Hoff Bensimhon  CHIEF COMPLAINT:  Bilateral Breast Cancers  CURRENT TREATMENT: Anastrozole  BREAST CANCER HISTORY: From the original intake note:  The patient noted a small amount of drainage from her left breast December of 2012. She brought it to her gynecologist's attention in January of 2013 and was set up for bilateral diagnostic mammography at the [facility] Center July 24, 2011. This was the patient's first ever mammogram. It showed a large irregular mass in the left retroareolar region extending to the nipple, with nipple retraction and skin thickening. This measured approximately 8.4 cm. It was associated with pleomorphic microcalcifications. By exam there was moderate distortion and retraction of the nipple with a large palpable ill-defined area in the retroareolar region. In the right right breast there was also a 2 cm hard mass palpated. Ultrasound of the right breast mass showed a complex cystic/solid area measuring 1.9 cm. Ultrasound of the right axilla was negative. Ultrasound of the left breast showed a large hypoechoic mass measuring at least 3.8 cm. The left axilla showed several adjacent abnormal appearing lymph nodes.  With this information biopsy of the right and left breast masses were obtained the same day, and showed ([id number])   (a) on the right, and invasive ductal carcinoma with papillary features, estrogen and progesterone receptor positive, HER-2 negative, with an MIB-1 of 10%.   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 The MIB-1 was 60% for the left-sided tumor.   With this information the patient was presented at the multidisciplinary breast cancer conference 08/06/2011. Subsequent evaluation and treatments  are as detailed below.  INTERVAL HISTORY: Linda [name] returns today for follow up of her metastatic breast cancer, accompanied by her friend Linda [name]. Today she is due for cycle 8 of T-DM 1, given every 21 days. The interval history is generally unremarkable. Overall she feels well with few complaints.  REVIEW OF SYSTEMS: Linda [name] denies fevers, chills, nausea, vomiting, or changes in bowel or bladder habits. Her appetite is picking up some since Thanksgiving and she has gained 2 pounds back. She denies headaches, but has felt some "pin prick" sensations to her forehead off and on. She still has not made an ophthalmologist appointment to have her eyes rechecked. She believes she needs new glasses. Her energy level is decent. She denies shortness of breath, chest pain, cough, or palpitations. A detailed review of systems is otherwise stable.   PAST MEDICAL HISTORY: Past Medical History  Diagnosis Date  . Cancer ([place]) 08-13-11    07-31-11-Dx. Bilarteral Breast cancer-left greater than rt.  . Hematuria, undiagnosed cause 08-13-11    Being evaluated by [facility] 08-14-11  . Hypertension   . Bronchitis     hx  . GERD (gastroesophageal reflux disease)     doing well  . Breast cancer ([place]) 07/30/11 dx    Right  invasive ductal ca 7 0'clock,& left breast=invasive ductal ca  and dcis, left axilla nlymph node, metastatic ca  . Seroma 02/04/12    right breast  200cc removed  erythema on right side  . Seasonal allergies   . History of radiation therapy 02/20/12-04/15/12    left breast,total 60.4 Gy  . Wears partial dentures     wears upper and lower partial  . Allergy   .  Blood transfusion without reported diagnosis   . 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   . Cancer of central portion of left female breast ([place]) 08/01/2011  . S/P radiation therapy 10/09/14-10/27/14    whole brain 37.5Gy/53f  . Acute pulmonary embolism ([place] 12/13/2014  . Acute on chronic diastolic  CHF (congestive heart failure) ([facility]     PAST SURGICAL HISTORY: Past Surgical History  Procedure Laterality Date  . Child birth  08-13-11    x3 -NVD  . Portacath placement  08/15/2011    Procedure: INSERTION PORT-A-CATH;  Surgeon: DHarl Bowie MD;  Location: [facility];  Service: General;  Laterality: N/A;  . Mastectomy w/ sentinel node biopsy  01/21/2012    Procedure: MASTECTOMY WITH SENTINEL LYMPH NODE BIOPSY;  Surgeon: DHarl Bowie MD;  [facility]  Service: General;  Laterality: Bilateral;  Left modified radical mastectomy, Rt mastectomy with Sentinel lymphnode biospy  . Breast surgery    . Port-a-cath removal Right 12/22/2012    Procedure: REMOVAL PORT-A-CATH;  Surgeon: DHarl Bowie MD;  [facility]  Service: General;  Laterality: Right;  . Subxyphoid pericardial window N/A 12/14/2014    Procedure: SUBXYPHOID PERICARDIAL WINDOW;  Surgeon: CRexene Alberts MD;  [facility] [facility]  Service: Thoracic;  Laterality: N/A;  . Tee without cardioversion N/A 12/14/2014    Procedure: TRANSESOPHAGEAL ECHOCARDIOGRAM (TEE);  Surgeon: CRexene Alberts MD;  [facility]  Service: Thoracic;  Laterality: N/A;  . Chest tube insertion Left 12/14/2014    Procedure: CHEST TUBE INSERTION;  Surgeon: CRexene Alberts MD;  [facility];  Service: Thoracic;  Laterality: Left;    FAMILY HISTORY Family History  Problem Relation Age of Onset  . Heart disease Mother   . Cancer Mother 456   breast, , 720deceased  . Heart attack Father   . Cancer Sister 662   breast  . Colon cancer Neg Hx   The patient's father died from a heart attack at the age of 845 The patient's mother died from apparently heart problems at the age of 840 The patient had no brothers. She has 3 sisters. One of her sisters was diagnosed with breast cancer in her mid 673s The patient does not know if his sister was ever genetically tested. The patient's mother had a mastectomy at  the age of 358 presumably for breast cancer. There is no other history of breast or in cancer in the family to her knowledge.   GYNECOLOGIC HISTORY:  She does not recall when she had menarche. She had her first child at age 69 She is GX P3. She underwent menopause in her mid 435s She never took hormone replacement.   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 [place] has a cGames developer[place] Son CGerald Stabslives [place] works as a fAirline pilot His wife is a nMarine scientist Son DShanon Browlives at [employer]and is a dAdvertising account executivein addition to having the mTenneco Inc The patient attends a local BLehman Brothershere   ADVANCED DIRECTIVES: Not in place. At the clinic visit 05/28/2015 the patient was given the appropriate forms to complete and notarize at her discretion. She tells me she ihas named her daughter-in-law BJacqlyn Larsenas her healthcare  power of attorney  HEALTH MAINTENANCE: Social History  Substance Use Topics  . Smoking status: Never Smoker   . Smokeless tobacco: Never Used  . Alcohol Use: 0.0 oz/week    0 Standard drinks or equivalent per week     Comment: rare- occ.     Colonoscopy: Never  PAP: Feb 2013  Bone density: November 2013, normal  Lipid panel:   Allergies  Allergen Reactions  . Ace Inhibitors Cough    Current Outpatient Prescriptions  Medication Sig Dispense Refill  . anastrozole (ARIMIDEX) 1 MG tablet Take 1 tablet (1 mg total) by mouth daily. 30 tablet 5  . b complex vitamins tablet Take 1 tablet by mouth daily.    . cholecalciferol (VITAMIN D) 1000 UNITS tablet Take 1 tablet (1,000 Units total) by mouth daily. 100 tablet 12  . furosemide (LASIX) 20 MG tablet Take 1 tablet (20 mg total) by mouth every other day. 30 tablet 0  . magnesium oxide (MAG-OX) 400 (241.3 MG) MG tablet Take 1 tablet (400  mg total) by mouth daily. 30 tablet 0  . Multiple Vitamins-Iron (MULTIVITAMINS WITH IRON) TABS Take 1 tablet by mouth daily.    . potassium chloride SA (K-DUR,KLOR-CON) 20 MEQ tablet TAKE 2 TABLETS BY MOUTH DAILY (Patient taking differently: TAKE 1 TABLETS BY MOUTH DAILY) 30 tablet 3   No current facility-administered medications for this visit.   Facility-Administered Medications Ordered in Other Visits  Medication Dose Route Frequency Provider Last Rate Last Dose  . ado-trastuzumab emtansine (KADCYLA) 360 mg in sodium chloride 0.9 % 250 mL chemo infusion  3.5 mg/kg (Treatment Plan Actual) Intravenous Once Chauncey Cruel, MD      . heparin lock flush 100 unit/mL  500 Units Intracatheter Once PRN Chauncey Cruel, MD      . sodium chloride 0.9 % injection 10 mL  10 mL Intracatheter PRN Chauncey Cruel, MD      . Zoledronic Acid (ZOMETA) 4 mg IVPB  4 mg Intravenous Once Laurie Panda, NP 400 mL/hr at 05/28/15 1516 4 mg at 05/28/15 1516    OBJECTIVE: [name] who appears stated age 25 Vitals:   05/28/15 1347  BP: 150/68  Pulse: 97  Temp: 97.7 F (36.5 C)  Resp: 19     Body mass index is 31.91 kg/(m^2).    ECOG FS: 1 Filed Weights   05/28/15 1347  Weight: 216 lb 3.2 oz (98.068 kg)   Skin: warm, dry  HEENT: sclerae anicteric, conjunctivae pink, oropharynx clear. No thrush or mucositis.  Lymph Nodes: No cervical or supraclavicular lymphadenopathy  Lungs: clear to auscultation bilaterally, no rales, wheezes, or rhonci  Heart: regular rate and rhythm  Abdomen: round, soft, non tender, positive bowel sounds  Musculoskeletal: No focal spinal tenderness, no peripheral edema  Neuro: non focal, well oriented, positive affect  Breasts: deferred  LAB RESULTS: CBC Latest Ref Rng 05/28/2015 05/08/2015 04/17/2015  WBC 3.9 - 10.3 10e3/uL 6.5 5.6 6.5  Hemoglobin 11.6 - 15.9 g/dL 12.3 12.4 12.3  Hematocrit 34.8 - 46.6 % 37.4 38.0 37.7  Platelets 145 - 400 10e3/uL 192  198 221   CMP Latest Ref Rng 05/28/2015 05/08/2015 04/17/2015  Glucose 70 - 140 mg/dl 118 130 105  BUN 7.0 - 26.0 mg/dL 13.3 12.5 11.8  Creatinine 0.6 - 1.1 mg/dL 0.8 0.8 0.7  Sodium 136 - 145 mEq/L 140 139 141  Potassium 3.5 - 5.1 mEq/L 3.8 3.9 4.0  Chloride 101 - 111 mmol/L - - -  CO2 22 - 29 mEq/L 27 23 28   Calcium 8.4 - 10.4 mg/dL 9.9 9.7 10.0  Total Protein 6.4 - 8.3 g/dL 7.8 7.6 7.9  Total Bilirubin 0.20 - 1.20 mg/dL 0.30 0.42 0.39  Alkaline Phos 40 - 150 U/L 79 76 68  AST 5 - 34 U/L 25 26 23   ALT 0 - 55 U/L 16 16 16     STUDIES: .No results found.  ASSESSMENT: 69 y.o.  [place] woman   (1)  status post bilateral breast biopsies 07/24/2011, showing,      (a) on the right, a clinical T2 N0 papillary/ductal breast cancer, estrogen and progesterone receptor positive, HER-2 negative, with an MIB-1 of 10%;     (b) on the left, a clinical T3 N1, stage IIIA invasive ductal carcinoma, grade 3, triple positive, with an MIB-1 of 60%.  (2)  Status post 4 dose dense cycles of doxorubicin/ cyclophosphamide followed by 4 dose dense cycles of paclitaxel and trastuzumab completed 12/09/2011  (3) the trastuzumab was continued for a total of one year (to 11/01/2012). Final echo on 11/04/2012 showing a well preserved ejection fraction of 55-60%.  (4) s/p bilateral mastectomies 01/21/2012 showing   (a) on the Right, an 8 mm invasive papillary carcinoma, grade 1, ypT1b ypN0   (b) on the Left, multiple microscopic foci of residual  Invasive ductal carcinoma with evidence of dermal lymphatic involvement, pyT1a/T4 pyN0  (5)  Postmastectomy radiation, completed 04/15/2012  (6) Started anastrazole 04/16/2012; normal dexa scan 05/19/2014 at the [facility] 09/11/2014: brain, bones, left effusion (7) CT angiogram 09/11/2014 shows new left pericardial effusion and new mediastinal and hilar lymphadenopathy; there were no suspicious upper abdominal findings; Cytology from  the left effusion 09/11/2014 ([id number]) shows malignant cells which are HER-2 positive  (8) Whole body bone scan on 09/25/14 showed metastatic foci throughout axial and appendicular skeleton. Areas of most potential concern include disease in the thoracic spine, disease in the right acetabulum, right superior pubic ramus and possibly proximal right femur, disease in the right humeral shaft and in both femoral shafts.  (8) Started trastuzumab/pertuzumab 09/26/2014, discontinued after 11/29/2014 dose, w progression  (a) T-DM1 started 01/02/2015  (b) echocardiogram 01/30/2015 shows a well preserved ejection fraction.  (9) Brain MRI on 09/25/14 was positive for numerous small enhancing brain mets and calvarium/bone mets   (a) whole brain radiation on 10/09/14--10/27/2014  (b) brain MRI 01/07/2015 shows a complete clinical response; REPEAT BRAIN mri 03/18/2015 SHOWS NO NEW LESIONS  (10) started zolendronate 11/29/2014, to be repeated every 12 weeks  11) left pleural effusion re-tapped 12/08/2014 (a) left chest tube placement 12/14/2014  (12) large pericardial effusion per echo 12/13/2014  (a) pericardial window placement 12/14/2014; fluid is hemorrhagic; path negative  (13) Right-sided pulmonary emboli noted on CT scan 12/13/2014  (a) no pulmonary emboli demonstrated on non-dedicated chest CT 04/13/2015  PLAN: Lulamae is doing well today. The labs were reviewed in detail and were stable. She will proceed with her next dose of T-DM1 as planned. She will be due for a repeat echo in January, so the appropriate orders were placed today.   Teka will return in 3 weeks for her next cycle of treatment. In 6 weeks she will see Dr. Jana Hakim. Prior to this visit she will have a repeat brain MRI and chest xray. She understands and agrees with this plan. She knows the goal of treatment in her case is control. She has been encouraged to call with any issues that might  arise before  her next visit here.   Laurie Panda, NP

## 2015-05-29 ENCOUNTER — Other Ambulatory Visit: Payer: Self-pay

## 2015-05-29 ENCOUNTER — Ambulatory Visit: Payer: Self-pay

## 2015-05-29 ENCOUNTER — Ambulatory Visit: Payer: Self-pay | Admitting: Nurse Practitioner

## 2015-06-06 ENCOUNTER — Encounter (HOSPITAL_COMMUNITY): Payer: Self-pay | Admitting: Vascular Surgery

## 2015-06-06 ENCOUNTER — Telehealth (HOSPITAL_COMMUNITY): Payer: Self-pay | Admitting: Vascular Surgery

## 2015-06-06 NOTE — Telephone Encounter (Signed)
Left pt message to make appt w/ echo 

## 2015-06-14 ENCOUNTER — Other Ambulatory Visit: Payer: Self-pay | Admitting: Radiation Therapy

## 2015-06-14 DIAGNOSIS — C7949 Secondary malignant neoplasm of other parts of nervous system: Principal | ICD-10-CM

## 2015-06-14 DIAGNOSIS — C7931 Secondary malignant neoplasm of brain: Secondary | ICD-10-CM

## 2015-06-15 ENCOUNTER — Ambulatory Visit (HOSPITAL_COMMUNITY)
Admission: RE | Admit: 2015-06-15 | Discharge: 2015-06-15 | Disposition: A | Discharge status: Home / Self Care | Payer: Medicare Other | Source: Ambulatory Visit | Attending: Oncology | Admitting: Oncology

## 2015-06-15 DIAGNOSIS — Z9013 Acquired absence of bilateral breasts and nipples: Secondary | ICD-10-CM | POA: Insufficient documentation

## 2015-06-15 DIAGNOSIS — J910 Malignant pleural effusion: Secondary | ICD-10-CM

## 2015-06-15 DIAGNOSIS — C50212 Malignant neoplasm of upper-inner quadrant of left female breast: Secondary | ICD-10-CM

## 2015-06-15 DIAGNOSIS — Z853 Personal history of malignant neoplasm of breast: Secondary | ICD-10-CM | POA: Insufficient documentation

## 2015-06-15 DIAGNOSIS — C50211 Malignant neoplasm of upper-inner quadrant of right female breast: Secondary | ICD-10-CM

## 2015-06-19 ENCOUNTER — Ambulatory Visit: Payer: Self-pay

## 2015-06-19 ENCOUNTER — Other Ambulatory Visit: Payer: Self-pay

## 2015-06-19 ENCOUNTER — Encounter: Payer: Self-pay | Admitting: Nurse Practitioner

## 2015-06-19 ENCOUNTER — Ambulatory Visit: Payer: Self-pay | Admitting: Nurse Practitioner

## 2015-06-19 ENCOUNTER — Other Ambulatory Visit (HOSPITAL_BASED_OUTPATIENT_CLINIC_OR_DEPARTMENT_OTHER): Payer: Medicare Other

## 2015-06-19 ENCOUNTER — Ambulatory Visit (HOSPITAL_BASED_OUTPATIENT_CLINIC_OR_DEPARTMENT_OTHER): Payer: Medicare Other

## 2015-06-19 ENCOUNTER — Ambulatory Visit (HOSPITAL_BASED_OUTPATIENT_CLINIC_OR_DEPARTMENT_OTHER): Payer: Medicare Other | Admitting: Nurse Practitioner

## 2015-06-19 DIAGNOSIS — C50911 Malignant neoplasm of unspecified site of right female breast: Secondary | ICD-10-CM

## 2015-06-19 DIAGNOSIS — C7931 Secondary malignant neoplasm of brain: Secondary | ICD-10-CM

## 2015-06-19 DIAGNOSIS — C50912 Malignant neoplasm of unspecified site of left female breast: Secondary | ICD-10-CM | POA: Diagnosis not present

## 2015-06-19 DIAGNOSIS — Z170 Estrogen receptor positive status [ER+]: Secondary | ICD-10-CM | POA: Diagnosis not present

## 2015-06-19 DIAGNOSIS — C50812 Malignant neoplasm of overlapping sites of left female breast: Secondary | ICD-10-CM

## 2015-06-19 DIAGNOSIS — J910 Malignant pleural effusion: Secondary | ICD-10-CM

## 2015-06-19 DIAGNOSIS — C50811 Malignant neoplasm of overlapping sites of right female breast: Secondary | ICD-10-CM

## 2015-06-19 DIAGNOSIS — C7951 Secondary malignant neoplasm of bone: Secondary | ICD-10-CM | POA: Diagnosis not present

## 2015-06-19 DIAGNOSIS — C50212 Malignant neoplasm of upper-inner quadrant of left female breast: Principal | ICD-10-CM

## 2015-06-19 DIAGNOSIS — Z5112 Encounter for antineoplastic immunotherapy: Secondary | ICD-10-CM

## 2015-06-19 DIAGNOSIS — C50211 Malignant neoplasm of upper-inner quadrant of right female breast: Secondary | ICD-10-CM

## 2015-06-19 LAB — COMPREHENSIVE METABOLIC PANEL
ALT: 19 U/L (ref 0–55)
ANION GAP: 8 meq/L (ref 3–11)
AST: 29 U/L (ref 5–34)
Albumin: 3.6 g/dL (ref 3.5–5.0)
Alkaline Phosphatase: 82 U/L (ref 40–150)
BUN: 16.8 mg/dL (ref 7.0–26.0)
CHLORIDE: 107 meq/L (ref 98–109)
CO2: 27 meq/L (ref 22–29)
CREATININE: 0.7 mg/dL (ref 0.6–1.1)
Calcium: 9.4 mg/dL (ref 8.4–10.4)
EGFR: 85 mL/min/{1.73_m2} — ABNORMAL LOW (ref >90)
Glucose: 91 mg/dl (ref 70–140)
POTASSIUM: 3.8 meq/L (ref 3.5–5.1)
Sodium: 141 mEq/L (ref 136–145)
Total Bilirubin: 0.41 mg/dL (ref 0.20–1.20)
Total Protein: 8.1 g/dL (ref 6.4–8.3)

## 2015-06-19 LAB — CBC WITH DIFFERENTIAL/PLATELET
BASO%: 0.5 % (ref 0.0–2.0)
Basophils Absolute: 0 10*3/uL (ref 0.0–0.1)
EOS%: 0.9 % (ref 0.0–7.0)
Eosinophils Absolute: 0.1 10*3/uL (ref 0.0–0.5)
HCT: 36.5 % (ref 34.8–46.6)
HGB: 12 g/dL (ref 11.6–15.9)
LYMPH%: 26.2 % (ref 14.0–49.7)
MCH: 30.1 pg (ref 25.1–34.0)
MCHC: 32.9 g/dL (ref 31.5–36.0)
MCV: 91.6 fL (ref 79.5–101.0)
MONO#: 0.6 10*3/uL (ref 0.1–0.9)
MONO%: 10.4 % (ref 0.0–14.0)
NEUT#: 3.7 10*3/uL (ref 1.5–6.5)
NEUT%: 62 % (ref 38.4–76.8)
PLATELETS: 195 10*3/uL (ref 145–400)
RBC: 3.99 10*6/uL (ref 3.70–5.45)
RDW: 15.2 % — ABNORMAL HIGH (ref 11.2–14.5)
WBC: 5.9 10*3/uL (ref 3.9–10.3)
lymph#: 1.6 10*3/uL (ref 0.9–3.3)

## 2015-06-19 MED ORDER — SODIUM CHLORIDE 0.9 % IJ SOLN
10.0000 mL | INTRAMUSCULAR | Status: DC | PRN
  Administered 2015-06-19: 10 mL
  Filled 2015-06-19: qty 10

## 2015-06-19 MED ORDER — HEPARIN SOD (PORK) LOCK FLUSH 100 UNIT/ML IV SOLN
500.0000 [IU] | Freq: Once | INTRAVENOUS | Status: AC | PRN
  Administered 2015-06-19: 500 [IU]
  Filled 2015-06-19: qty 5

## 2015-06-19 MED ORDER — SODIUM CHLORIDE 0.9 % IV SOLN
Freq: Once | INTRAVENOUS | Status: AC
  Administered 2015-06-19: 15:00:00 via INTRAVENOUS

## 2015-06-19 MED ORDER — SODIUM CHLORIDE 0.9 % IV SOLN
3.5000 mg/kg | Freq: Once | INTRAVENOUS | Status: AC
  Administered 2015-06-19: 360 mg via INTRAVENOUS
  Filled 2015-06-19: qty 13

## 2015-06-19 MED ORDER — ACETAMINOPHEN 325 MG PO TABS
650.0000 mg | ORAL_TABLET | Freq: Once | ORAL | Status: AC
  Administered 2015-06-19: 650 mg via ORAL

## 2015-06-19 MED ORDER — DIPHENHYDRAMINE HCL 25 MG PO CAPS
25.0000 mg | ORAL_CAPSULE | Freq: Once | ORAL | Status: AC
  Administered 2015-06-19: 25 mg via ORAL

## 2015-06-19 MED ORDER — DIPHENHYDRAMINE HCL 25 MG PO CAPS
ORAL_CAPSULE | ORAL | Status: AC
  Filled 2015-06-19: qty 1

## 2015-06-19 MED ORDER — ACETAMINOPHEN 325 MG PO TABS
ORAL_TABLET | ORAL | Status: AC
  Filled 2015-06-19: qty 2

## 2015-06-19 NOTE — Progress Notes (Signed)
ID: Linda [name]   DOB: August 08, 1945  MR#: [id number]  OXB#:[id number]  PCP: Leeanne Rio, PA-C GYN: SU: Coralie Keens OTHER MD: Crissie Reese, Linna Hoff Bensimhon  CHIEF COMPLAINT:  Bilateral Breast Cancers  CURRENT TREATMENT: Anastrozole, T-DM 1  BREAST CANCER HISTORY: From the original intake note:  The patient noted a small amount of drainage from her left breast December of 2012. She brought it to her gynecologist's attention in January of 2013 and was set up for bilateral diagnostic mammography at the [facility] Center July 24, 2011. This was the patient's first ever mammogram. It showed a large irregular mass in the left retroareolar region extending to the nipple, with nipple retraction and skin thickening. This measured approximately 8.4 cm. It was associated with pleomorphic microcalcifications. By exam there was moderate distortion and retraction of the nipple with a large palpable ill-defined area in the retroareolar region. In the right right breast there was also a 2 cm hard mass palpated. Ultrasound of the right breast mass showed a complex cystic/solid area measuring 1.9 cm. Ultrasound of the right axilla was negative. Ultrasound of the left breast showed a large hypoechoic mass measuring at least 3.8 cm. The left axilla showed several adjacent abnormal appearing lymph nodes.  With this information biopsy of the right and left breast masses were obtained the same day, and showed ([id number])   (a) on the right, and invasive ductal carcinoma with papillary features, estrogen and progesterone receptor positive, HER-2 negative, with an MIB-1 of 10%.   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 The MIB-1 was 60% for the left-sided tumor.   With this information the patient was presented at the multidisciplinary breast cancer conference 08/06/2011. Subsequent evaluation and  treatments are as detailed below.  INTERVAL HISTORY: [name] returns today for follow up of her metastatic breast cancer, alone. Today she is due for cycle 9 of T-DM 1, given every 21 days. She is also on anastrozole daily. She tolerates both drug well with no side effects that she is aware of. The interval history is generally unremarkable. Her birthday was yesterday and she enjoyed her day  REVIEW OF SYSTEMS: Pat's overall weight is trending down, but she has maintained her weight +/- 2lb over the past 2 months. Her appetite is decent. She denies fevers, chills, nausea, vomiting, or changes in bowel or bladder habits. Her energy level is good, and she is back to work at [employer]. She denies shortness of breath, chest pain, cough, or palpitations. A detailed review of systems is otherwise stable.   PAST MEDICAL HISTORY: Past Medical History  Diagnosis Date  . Cancer ([place]) 08-13-11    07-31-11-Dx. Bilarteral Breast cancer-left greater than rt.  . Hematuria, undiagnosed cause 08-13-11    Being evaluated by [facility] 08-14-11  . Hypertension   . Bronchitis     hx  . GERD (gastroesophageal reflux disease)     doing well  . Breast cancer ([place]) 07/30/11 dx    Right  invasive ductal ca 7 0'clock,& left breast=invasive ductal ca  and dcis, left axilla nlymph node, metastatic ca  . Seroma 02/04/12    right breast  200cc removed  erythema on right side  . Seasonal allergies   . History of radiation therapy 02/20/12-04/15/12    left breast,total 60.4 Gy  . Wears partial dentures     wears upper and lower partial  . Allergy   .  Blood transfusion without reported diagnosis   . 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   . Cancer of central portion of left female breast ([place]) 08/01/2011  . S/P radiation therapy 10/09/14-10/27/14    whole brain 37.5Gy/55f  . [place] pulmonary embolism ([facility] 12/13/2014  . Acute on chronic diastolic CHF (congestive heart failure)  ([facility]     PAST SURGICAL HISTORY: Past Surgical History  Procedure Laterality Date  . Child birth  08-13-11    x3 -NVD  . Portacath placement  08/15/2011    Procedure: INSERTION PORT-A-CATH;  Surgeon: DHarl Bowie MD;  Location: [facility];  Service: General;  Laterality: N/A;  . Mastectomy w/ sentinel node biopsy  01/21/2012    Procedure: MASTECTOMY WITH SENTINEL LYMPH NODE BIOPSY;  Surgeon: DHarl Bowie MD;  [facility]  Service: General;  Laterality: Bilateral;  Left modified radical mastectomy, Rt mastectomy with Sentinel lymphnode biospy  . Breast surgery    . Port-a-cath removal Right 12/22/2012    Procedure: REMOVAL PORT-A-CATH;  Surgeon: DHarl Bowie MD;  [facility]  Service: General;  Laterality: Right;  . Subxyphoid pericardial window N/A 12/14/2014    Procedure: SUBXYPHOID PERICARDIAL WINDOW;  Surgeon: CRexene Alberts MD;  [facility]  Service: Thoracic;  Laterality: N/A;  . Tee without cardioversion N/A 12/14/2014    Procedure: TRANSESOPHAGEAL ECHOCARDIOGRAM (TEE);  Surgeon: CRexene Alberts MD;  [facility]  Service: Thoracic;  Laterality: N/A;  . Chest tube insertion Left 12/14/2014    Procedure: CHEST TUBE INSERTION;  Surgeon: CRexene Alberts MD;  [facility];  Service: Thoracic;  Laterality: Left;    FAMILY HISTORY Family History  Problem Relation Age of Onset  . Heart disease Mother   . Cancer Mother 433   breast, , [id number]  . Heart attack Father   . Cancer Sister 615   breast  . Colon cancer Neg Hx   The patient's father died from a heart attack at the age of 859 The patient's mother died from apparently heart problems at the age of 829 The patient had no brothers. She has 3 sisters. One of her sisters was diagnosed with breast cancer in her mid 668s The patient does not know if his sister was ever genetically tested. The patient's mother had a mastectomy at the age of 311 presumably for  breast cancer. There is no other history of breast or in cancer in the family to her knowledge.   GYNECOLOGIC HISTORY:  She does not recall when she had menarche. She had her first child at age 70 She is GX P3. She underwent menopause in her mid 471s She never took hormone replacement.   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 [place] has a cGames developerin [place] Son CGerald Stabslives in [place] works as a fAirline pilot His wife is a nMarine scientist Son DShanon Browlives at [employer]and is a dAdvertising account executivein addition to having the m[employer] The patient attends a local BLehman Brothershere   ADVANCED DIRECTIVES: Not in place. At the clinic visit 06/19/2015 the patient was given the appropriate forms to complete and notarize at her discretion. She tells me she ihas named her daughter-in-law BJacqlyn Larsenas her healthcare  power of attorney  HEALTH MAINTENANCE: Social History  Substance Use Topics  . Smoking status: Never Smoker   . Smokeless tobacco: Never Used  . Alcohol Use: 0.0 oz/week    0 Standard drinks or equivalent per week     Comment: rare- occ.     Colonoscopy: Never  PAP: Feb 2013  Bone density: November 2013, normal  Lipid panel:   Allergies  Allergen Reactions  . Ace Inhibitors Cough    Current Outpatient Prescriptions  Medication Sig Dispense Refill  . anastrozole (ARIMIDEX) 1 MG tablet Take 1 tablet (1 mg total) by mouth daily. 30 tablet 5  . b complex vitamins tablet Take 1 tablet by mouth daily.    . cholecalciferol (VITAMIN D) 1000 UNITS tablet Take 1 tablet (1,000 Units total) by mouth daily. 100 tablet 12  . furosemide (LASIX) 20 MG tablet Take 1 tablet (20 mg total) by mouth every other day. (Patient taking differently: Take 20 mg by mouth 2 (two) times a week. ) 30 tablet 0  . magnesium oxide (MAG-OX)  400 (241.3 MG) MG tablet Take 1 tablet (400 mg total) by mouth daily. 30 tablet 0  . Multiple Vitamins-Iron (MULTIVITAMINS WITH IRON) TABS Take 1 tablet by mouth daily.    . potassium chloride SA (K-DUR,KLOR-CON) 20 MEQ tablet TAKE 2 TABLETS BY MOUTH DAILY (Patient taking differently: TAKE 1 TABLETS BY MOUTH DAILY) 30 tablet 3   No current facility-administered medications for this visit.    OBJECTIVE: [name] who appears stated age There were no vitals filed for this visit.   There is no weight on file to calculate BMI.    ECOG FS: 1 There were no vitals filed for this visit.   Skin: warm, dry  HEENT: sclerae anicteric, conjunctivae pink, oropharynx clear. No thrush or mucositis.  Lymph Nodes: No cervical or supraclavicular lymphadenopathy  Lungs: clear to auscultation bilaterally, no rales, wheezes, or rhonci  Heart: regular rate and rhythm  Abdomen: round, soft, non tender, positive bowel sounds  Musculoskeletal: No focal spinal tenderness, no peripheral edema  Neuro: non focal, well oriented, positive affect  Breasts: deferred  LAB RESULTS: CBC Latest Ref Rng 06/19/2015 05/28/2015 05/08/2015  WBC 3.9 - 10.3 10e3/uL 5.9 6.5 5.6  Hemoglobin 11.6 - 15.9 g/dL 12.0 12.3 12.4  Hematocrit 34.8 - 46.6 % 36.5 37.4 38.0  Platelets 145 - 400 10e3/uL 195 192 198   CMP Latest Ref Rng 05/28/2015 05/08/2015 04/17/2015  Glucose 70 - 140 mg/dl 118 130 105  BUN 7.0 - 26.0 mg/dL 13.3 12.5 11.8  Creatinine 0.6 - 1.1 mg/dL 0.8 0.8 0.7  Sodium 136 - 145 mEq/L 140 139 141  Potassium 3.5 - 5.1 mEq/L 3.8 3.9 4.0  Chloride 101 - 111 mmol/L - - -  CO2 22 - 29 mEq/L _0 Calcium 8.4 - 10.4 mg/dL 9.9 9.7 10.0  Total Protein 6.4 - 8.3 g/dL 7.8 7.6 7.9  Total Bilirubin 0.20 - 1.20 mg/dL 0.30 0.42 0.39  Alkaline Phos 40 - 150 U/L 79 76 68  AST 5 - 34 U/L _1 ALT 0 - 55 U/L _2 STUDIES: .Dg Chest 2 View  06/15/2015  CLINICAL DATA:  History of breast carcinoma.   Hypertension. EXAM: CHEST  2 VIEW COMPARISON:  Chest radiograph January 01, 2015 and chest CT April 13, 2015 FINDINGS: There is no edema or consolidation. Heart size and pulmonary vascularity are normal. No pleural effusions. No adenopathy.  Patient is status post mastectomies. There are surgical clips in left axillary region. Port-A-Cath tip is at the cavoatrial junction. No pneumothorax. There is degenerative change in the thoracic spine. There are no blastic or lytic bone lesions. IMPRESSION: No edema or consolidation. No adenopathy or effusions. Postoperative change with evidence of bilateral mastectomies. Port-A-Cath tip at cavoatrial junction without pneumothorax. Electronically Signed   By: Lowella Grip III M.D.   On: 06/15/2015 10:54    ASSESSMENT: 70 y.o.  [place] woman   (1)  status post bilateral breast biopsies 07/24/2011, showing,      (a) on the right, a clinical T2 N0 papillary/ductal breast cancer, estrogen and progesterone receptor positive, HER-2 negative, with an MIB-1 of 10%;     (b) on the left, a clinical T3 N1, stage IIIA invasive ductal carcinoma, grade 3, triple positive, with an MIB-1 of 60%.  (2)  Status post 4 dose dense cycles of doxorubicin/ cyclophosphamide followed by 4 dose dense cycles of paclitaxel and trastuzumab completed 12/09/2011  (3) the trastuzumab was continued for a total of one year (to 11/01/2012). Final echo on 11/04/2012 showing a well preserved ejection fraction of 55-60%.  (4) s/p bilateral mastectomies 01/21/2012 showing   (a) on the Right, an 8 mm invasive papillary carcinoma, grade 1, ypT1b ypN0   (b) on the Left, multiple microscopic foci of residual  Invasive ductal carcinoma with evidence of dermal lymphatic involvement, pyT1a/T4 pyN0  (5)  Postmastectomy radiation, completed 04/15/2012  (6) Started anastrazole 04/16/2012; normal dexa scan 05/19/2014 at the [facility] 09/11/2014: brain, bones, left  effusion (7) CT angiogram 09/11/2014 shows new left pericardial effusion and new mediastinal and hilar lymphadenopathy; there were no suspicious upper abdominal findings; Cytology from the left effusion 09/11/2014 ([id number]) shows malignant cells which are HER-2 positive  (8) Whole body bone scan on 09/25/14 showed metastatic foci throughout axial and appendicular skeleton. Areas of most potential concern include disease in the thoracic spine, disease in the right acetabulum, right superior pubic ramus and possibly proximal right femur, disease in the right humeral shaft and in both femoral shafts.  (8) Started trastuzumab/pertuzumab 09/26/2014, discontinued after 11/29/2014 dose, w progression  (a) T-DM1 started 01/02/2015  (b) echocardiogram 01/30/2015 shows a well preserved ejection fraction.  (9) Brain MRI on 09/25/14 was positive for numerous small enhancing brain mets and calvarium/bone mets   (a) whole brain radiation on 10/09/14--10/27/2014  (b) brain MRI 01/07/2015 shows a complete clinical response; REPEAT BRAIN mri 03/18/2015 SHOWS NO NEW LESIONS  (10) started zolendronate 11/29/2014, to be repeated every 12 weeks  11) left pleural effusion re-tapped 12/08/2014 (a) left chest tube placement 12/14/2014  (12) large pericardial effusion per echo 12/13/2014  (a) pericardial window placement 12/14/2014; fluid is hemorrhagic; path negative  (13) Right-sided pulmonary emboli noted on CT scan 12/13/2014  (a) no pulmonary emboli demonstrated on non-dedicated chest CT 04/13/2015  PLAN: Mardene Celeste and I reviewed the results of her chest xray. There were no pleural effusions or pulmonary nodules noted. She is breathing well and is tolerating her treatments well. The labs were reviewed in detail and were stable. She will proceed with cycle 9 of kadcyla (T-DM 1) as planned. Her next echocardiogram is planned for 1/16.   Renae will return in 3 weeks for cycle 10 of  treatment as well as a follow up visit with Dr. Jana Hakim. Prior to this visit she will have a repeat brain MRI. She understands and agrees with this plan. She knows  the goal of treatment in her case is control. She has been encouraged to call with any issues that might arise before her next visit here.   Laurie Panda, NP

## 2015-06-19 NOTE — Patient Instructions (Signed)
Ado-Trastuzumab Emtansine for injection  What is this medicine?  ADO-TRASTUZUMAB EMTANSINE (ADD oh traz TOO zuh mab em TAN zine) is a monoclonal antibody combined with chemotherapy. It is used to treat breast cancer.  This medicine may be used for other purposes; ask your health care provider or pharmacist if you have questions.  What should I tell my health care provider before I take this medicine?  They need to know if you have any of these conditions:  -heart disease  -heart failure  -infection (especially a virus infection such as chickenpox, cold sores, or herpes)  -liver disease  -lung or breathing disease, like asthma  -an unusual or allergic reaction to ado-trastuzumab emtansine, other medications, foods, dyes, or preservatives  -pregnant or trying to get pregnant  -breast-feeding  How should I use this medicine?  This medicine is for infusion into a vein. It is given by a health care professional in a hospital or clinic setting.  Talk to your pediatrician regarding the use of this medicine in children. Special care may be needed.  Overdosage: If you think you have taken too much of this medicine contact a poison control center or emergency room at once.  NOTE: This medicine is only for you. Do not share this medicine with others.  What if I miss a dose?  It is important not to miss your dose. Call your doctor or health care professional if you are unable to keep an appointment.  What may interact with this medicine?  This medicine may also interact with the following medications:  -atazanavir  -boceprevir  -clarithromycin  -delavirdine  -indinavir  -dalfopristin; quinupristin  -isoniazid, INH  -itraconazole  -ketoconazole  -nefazodone  -nelfinavir  -ritonavir  -telaprevir  -telithromycin  -tipranavir  -voriconazole  This list may not describe all possible interactions. Give your health care provider a list of all the medicines, herbs, non-prescription drugs, or dietary supplements you use. Also tell them  if you smoke, drink alcohol, or use illegal drugs. Some items may interact with your medicine.  What should I watch for while using this medicine?  Visit your doctor for checks on your progress. This drug may make you feel generally unwell. This is not uncommon, as chemotherapy can affect healthy cells as well as cancer cells. Report any side effects. Continue your course of treatment even though you feel ill unless your doctor tells you to stop.  Call your doctor or health care professional for advice if you get a fever, chills or sore throat, or other symptoms of a cold or flu. Do not treat yourself. This drug decreases your body's ability to fight infections. Try to avoid being around people who are sick.  Be careful brushing and flossing your teeth or using a toothpick because you may get an infection or bleed more easily. If you have any dental work done, tell your dentist you are receiving this medicine.  Avoid taking products that contain aspirin, acetaminophen, ibuprofen, naproxen, or ketoprofen unless instructed by your doctor. These medicines may hide a fever.  Do not become pregnant while taking this medicine or for 7 months after stopping it. Women should inform their doctor if they wish to become pregnant or think they might be pregnant. There is a potential for serious side effects to an unborn child. [Men should inform their doctors if they wish to father a child. This medicine may lower sperm counts.] Talk to your health care professional or pharmacist for more information. Do not breast-feed an   infant while taking this medicine.  You may need blood work done while you are taking this medicine.  What side effects may I notice from receiving this medicine?  Side effects that you should report to your doctor or health care professional as soon as possible:  -allergic reactions like skin rash, itching or hives, swelling of the face, lips, or tongue  -breathing problems  -chest pain or  palpitations  -fever or chills, sore throat  -general ill feeling or flu-like symptoms  -light-colored stools  -nausea, vomiting  -pain, tingling, numbness in the hands or feet  -signs and symptoms of bleeding such as bloody or black, tarry stools; red or dark-brown urine; spitting up blood or brown material that looks like coffee grounds; red spots on the skin; unusual bruising or bleeding from the eye, gums, or nose  -swelling of the legs or ankles  -yellowing of the eyes or skin  Side effects that usually do not require medical attention (Report these to your doctor or health care professional if they continue or are bothersome.):  -changes in taste  -constipation  -dizziness  -headache  -joint pain  -muscle pain  -trouble sleeping  -unusually weak or tired  This list may not describe all possible side effects. Call your doctor for medical advice about side effects. You may report side effects to FDA at 1-800-FDA-1088.  Where should I keep my medicine?  This drug is given in a hospital or clinic and will not be stored at home.  NOTE: This sheet is a summary. It may not cover all possible information. If you have questions about this medicine, talk to your doctor, pharmacist, or health care provider.      2016, Elsevier/Gold Standard. (2014-08-01 16:46:53)

## 2015-06-29 ENCOUNTER — Ambulatory Visit (HOSPITAL_COMMUNITY)
Admission: RE | Admit: 2015-06-29 | Discharge: 2015-06-29 | Disposition: A | Discharge status: Home / Self Care | Payer: Medicare Other | Source: Ambulatory Visit | Attending: Oncology | Admitting: Oncology

## 2015-06-29 ENCOUNTER — Ambulatory Visit (HOSPITAL_COMMUNITY): Payer: Medicare Other

## 2015-06-29 DIAGNOSIS — C50919 Malignant neoplasm of unspecified site of unspecified female breast: Secondary | ICD-10-CM | POA: Insufficient documentation

## 2015-06-29 DIAGNOSIS — C7931 Secondary malignant neoplasm of brain: Secondary | ICD-10-CM | POA: Diagnosis not present

## 2015-06-29 DIAGNOSIS — C7949 Secondary malignant neoplasm of other parts of nervous system: Secondary | ICD-10-CM | POA: Diagnosis not present

## 2015-06-29 DIAGNOSIS — C7951 Secondary malignant neoplasm of bone: Secondary | ICD-10-CM | POA: Insufficient documentation

## 2015-06-29 MED ORDER — GADOBENATE DIMEGLUMINE 529 MG/ML IV SOLN
20.0000 mL | Freq: Once | INTRAVENOUS | Status: AC | PRN
  Administered 2015-06-29: 20 mL via INTRAVENOUS

## 2015-07-01 NOTE — Progress Notes (Signed)
Quick Note:  Please call patient with normal result.  Thanks. MM ______ 

## 2015-07-02 ENCOUNTER — Ambulatory Visit: Admission: RE | Admit: 2015-07-02 | Payer: Medicare Other | Source: Ambulatory Visit | Admitting: Radiation Oncology

## 2015-07-02 ENCOUNTER — Ambulatory Visit (HOSPITAL_BASED_OUTPATIENT_CLINIC_OR_DEPARTMENT_OTHER)
Admission: RE | Admit: 2015-07-02 | Discharge: 2015-07-02 | Disposition: A | Discharge status: Home / Self Care | Payer: Medicare Other | Source: Ambulatory Visit | Attending: Internal Medicine | Admitting: Internal Medicine

## 2015-07-02 ENCOUNTER — Telehealth: Payer: Self-pay

## 2015-07-02 ENCOUNTER — Ambulatory Visit (HOSPITAL_COMMUNITY)
Admission: RE | Admit: 2015-07-02 | Discharge: 2015-07-02 | Disposition: A | Discharge status: Home / Self Care | Payer: Medicare Other | Source: Ambulatory Visit | Attending: Physician Assistant | Admitting: Physician Assistant

## 2015-07-02 ENCOUNTER — Encounter (HOSPITAL_COMMUNITY): Payer: Self-pay | Admitting: Internal Medicine

## 2015-07-02 ENCOUNTER — Telehealth: Payer: Self-pay | Admitting: Radiation Oncology

## 2015-07-02 VITALS — BP 126/78 | HR 78 | Wt 220.8 lb

## 2015-07-02 DIAGNOSIS — C50212 Malignant neoplasm of upper-inner quadrant of left female breast: Secondary | ICD-10-CM

## 2015-07-02 DIAGNOSIS — Z9013 Acquired absence of bilateral breasts and nipples: Secondary | ICD-10-CM | POA: Diagnosis not present

## 2015-07-02 DIAGNOSIS — J910 Malignant pleural effusion: Secondary | ICD-10-CM | POA: Diagnosis not present

## 2015-07-02 DIAGNOSIS — I110 Hypertensive heart disease with heart failure: Secondary | ICD-10-CM | POA: Diagnosis not present

## 2015-07-02 DIAGNOSIS — I509 Heart failure, unspecified: Secondary | ICD-10-CM | POA: Diagnosis not present

## 2015-07-02 DIAGNOSIS — C7931 Secondary malignant neoplasm of brain: Secondary | ICD-10-CM | POA: Diagnosis not present

## 2015-07-02 DIAGNOSIS — E876 Hypokalemia: Secondary | ICD-10-CM | POA: Diagnosis not present

## 2015-07-02 DIAGNOSIS — I10 Essential (primary) hypertension: Secondary | ICD-10-CM

## 2015-07-02 DIAGNOSIS — I5032 Chronic diastolic (congestive) heart failure: Secondary | ICD-10-CM | POA: Diagnosis not present

## 2015-07-02 DIAGNOSIS — I313 Pericardial effusion (noninflammatory): Secondary | ICD-10-CM

## 2015-07-02 DIAGNOSIS — C50211 Malignant neoplasm of upper-inner quadrant of right female breast: Secondary | ICD-10-CM | POA: Insufficient documentation

## 2015-07-02 DIAGNOSIS — I071 Rheumatic tricuspid insufficiency: Secondary | ICD-10-CM | POA: Insufficient documentation

## 2015-07-02 DIAGNOSIS — I319 Disease of pericardium, unspecified: Secondary | ICD-10-CM | POA: Diagnosis not present

## 2015-07-02 DIAGNOSIS — I3139 Other pericardial effusion (noninflammatory): Secondary | ICD-10-CM

## 2015-07-02 NOTE — Telephone Encounter (Signed)
Phoned patient to inform her of normal result and discuss cancelling her follow up appointment today. No answer. Left message. Phoned patient's son, Gerald Stabs, with the same intent. No answer. Left message.

## 2015-07-02 NOTE — Progress Notes (Signed)
Patient ID: Linda [name], female   DOB: 06-13-1946, 70 y.o.   MRN: [id number]  Primary Physician: Leeanne Rio, PA-C Primary Cardiologist: Dr Haroldine Laws  HPI:  Linda [name] is a 70 year-old [name] woman with metastatic breast CA status post bilateral mastectomies 8/13. Post-mastectomy radiation completed 04/15/2012  She had cyclophosphamide and doxorubicin in dose dense fashion x4, to be followed by paclitaxel and trastuzumab again every 2 weeks x4, with the trastuzumab continued, but completed 11/01/2012.  Started anastrazole 04/16/2012; normal dexa scan 05/03/2012 at the [facility]. Still on anastrazole.  Hospitalized 09/10/2014>>09/12/2014 with pleural effusion, s/p thoracentesis w/ 900 cc out. CTA showed pericardial effusion. Effusion (NZB [id number]) showed malignant cells which were HER-2 positive  Started trastuzumab/pertuzumab 09/26/2014, to be repeated every 21 days indefinitely.   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 Started whole brain radiation on 10/09/14. On Decadron.   Admitted 06/29>>7/8 w/massive pericardial effusion and volume overload. Underwent pericardial window (malingant) and thoracentesis. Diuresed about 40 pounds. Placed on Hospice care. Has since started salvage chemo RX.   She returns for follow up. Overall feeling well. Recently had MRI and appeared stable.  Denies SOB/PND/Orthopnea.  Weight at home 216-220 pounds. Has not had extra lasix. Appetite improved. Went back to work full time.Taking all medications.   CXR 7/19: Small bilateral effusions L>R  Labs: 01/02/15 Creatinine 0.7  Potassium 4.5 Labs: 06/19/2015: K 3.8 Creatinine 0.7 Hgb 12.0   ECHO 62017 EF 60-65% large pericardial effusion without tamponade ECHO 01/2016 EF 60-65% pericardial effusion with tamponade   Past Medical History  Diagnosis Date  . Cancer ([place]) 08-13-11    07-31-11-Dx. Bilarteral Breast cancer-left  greater than rt.  . Hematuria, undiagnosed cause 08-13-11    Being evaluated by [facility] 08-14-11  . Hypertension   . Bronchitis     hx  . GERD (gastroesophageal reflux disease)     doing well  . Breast cancer ([employer]) 07/30/11 dx    Right  invasive ductal ca 7 0'clock,& left breast=invasive ductal ca  and dcis, left axilla nlymph node, metastatic ca  . Seroma 02/04/12    right breast  200cc removed  erythema on right side  . Seasonal allergies   . History of radiation therapy 02/20/12-04/15/12    left breast,total 60.4 Gy  . Wears partial dentures     wears upper and lower partial  . Allergy   . Blood transfusion without reported diagnosis   . 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   . Cancer of central portion of left female breast ([place]) 08/01/2011  . S/P radiation therapy 10/09/14-10/27/14    whole brain 37.5Gy/37f  . Acute pulmonary embolism ([facility] 12/13/2014  . Acute on chronic diastolic CHF (congestive heart failure) ([facility]     Current Outpatient Prescriptions  Medication Sig Dispense Refill  . anastrozole (ARIMIDEX) 1 MG tablet Take 1 tablet (1 mg total) by mouth daily. 30 tablet 5  . b complex vitamins tablet Take 1 tablet by mouth daily.    . cholecalciferol (VITAMIN D) 1000 UNITS tablet Take 1 tablet (1,000 Units total) by mouth daily. 100 tablet 12  . furosemide (LASIX) 20 MG tablet Take 1 tablet (20 mg total) by mouth every other day. (Patient taking differently: Take 20 mg by mouth 2 (two) times a week. ) 30 tablet 0  . magnesium oxide (MAG-OX) 400 (241.3 MG) MG tablet Take 1 tablet (400 mg total) by  mouth daily. 30 tablet 0  . Multiple Vitamins-Iron (MULTIVITAMINS WITH IRON) TABS Take 1 tablet by mouth daily.    . potassium chloride SA (K-DUR,KLOR-CON) 20 MEQ tablet TAKE 2 TABLETS BY MOUTH DAILY (Patient taking differently: TAKE 1 TABLETS BY MOUTH DAILY) 30 tablet 3   No current facility-administered medications for this  encounter.   Facility-Administered Medications Ordered in Other Encounters  Medication Dose Route Frequency Provider Last Rate Last Dose  . sodium chloride 0.9 % injection 10 mL  10 mL Intracatheter PRN Chauncey Cruel, MD   10 mL at 06/19/15 1554    Allergies  Allergen Reactions  . Ace Inhibitors Cough      Social History   Social History  . Marital Status: Single    Spouse Name: N/A  . Number of Children: 3  . Years of Education: N/A   Occupational History  . Retired    Social History Main Topics  . Smoking status: Never Smoker   . Smokeless tobacco: Never Used  . Alcohol Use: 0.0 oz/week    0 Standard drinks or equivalent per week     Comment: rare- occ.  . Drug Use: No  . Sexual Activity: No     Comment: gxp3, 1st child age 4, menopause mid 62's,no hrt   Other Topics Concern  . Not on file   Social History Narrative   Lives with son.  Single.  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 She moved to [place] about 2 years ago but has a Games developer in [place]. Son Gerald Stabs lives in [place] and works as a Airline pilot. His wife is a Marine scientist. Son Shanon Brow lives at [place] and is a Advertising account executive in addition to having the [employer]. The patient attends a local [employer] here.      Family History  Problem Relation Age of Onset  . Heart disease Mother   . Cancer Mother 58    breast, , 33 deceased  . Heart attack Father   . Cancer Sister 67    breast  . Colon cancer Neg Hx     Filed Vitals:   07/02/15 1353  BP: 126/78  Pulse: 78  Weight: 220 lb 12 oz (100.132 kg)  SpO2: 99%    PHYSICAL EXAM: General:  No respiratory difficulty HEENT: normal. alopecic Neck: supple. no JVD. Carotids 2+ bilat; no bruits. No lymphadenopathy or thryomegaly appreciated. Cor: PMI nondisplaced. Regular rate & rhythm. No rubs, gallops or murmurs. Lungs:  clear. Decreased left base Abdomen: soft, nontender, nondistended. No hepatosplenomegaly. No bruits or masses. Good bowel sounds. Extremities: no cyanosis, clubbing, rash, edema mild lymphedema in L arm Neuro: alert & oriented x 3, cranial nerves grossly intact. moves all 4 extremities w/o difficulty. Affect pleasa   ASSESSMENT & PLAN: 1. Metastatic/recurrent breast CA, stage IV s/p salvage chemo  2. Chronic diastolic HF - Today ECHO was discussed and reviewed by Dr Haroldine Laws. EF 55-60%. No pericardial effusion. Volume status stable. Continue lasix 20 mg twice a week.  3. Large hemorrhagic pericardial effusion (malignant) with early tamponade  -s/p window 6/30 4. Pleural effusion, resolved  Follow up in 1 year with an ECHO .   Darrick Grinder, NP-C  1:52 PM   Patient seen and examined with Darrick Grinder, NP. We discussed all aspects of the encounter. I agree with the assessment and plan as stated  above.   She is doing incredibly well. Echo reviewed personally. Normal EF with no recurrent effusion. Volume status is well controlled. Continue current lasix dose. We will see back in 1 year or sooner, if needed.

## 2015-07-02 NOTE — Patient Instructions (Signed)
Follow up in 1 year with an ECHO 

## 2015-07-02 NOTE — Progress Notes (Signed)
*  PRELIMINARY RESULTS* Echocardiogram 2D Echocardiogram has been performed.  Leavy Cella 07/02/2015, 1:01 PM

## 2015-07-02 NOTE — Telephone Encounter (Signed)
Writer called patient per Dr. Jana Hakim to let her know that her MRI of the brain is stable.  Patient stated that she received a call this morning by [facility] who gave her the same news.  She was happy with the reported information.

## 2015-07-03 NOTE — Telephone Encounter (Signed)
Received return call from patient. Patient verbalized understanding of normal scan results and expressed appreciation for return call. Patient understands Mont Dutton, [facility] navigator, will be in touch with future scan and follow up appointments.

## 2015-07-10 ENCOUNTER — Ambulatory Visit (HOSPITAL_BASED_OUTPATIENT_CLINIC_OR_DEPARTMENT_OTHER): Payer: Medicare Other | Admitting: Oncology

## 2015-07-10 ENCOUNTER — Ambulatory Visit (HOSPITAL_BASED_OUTPATIENT_CLINIC_OR_DEPARTMENT_OTHER): Payer: Medicare Other

## 2015-07-10 ENCOUNTER — Telehealth: Payer: Self-pay | Admitting: Nurse Practitioner

## 2015-07-10 ENCOUNTER — Other Ambulatory Visit (HOSPITAL_BASED_OUTPATIENT_CLINIC_OR_DEPARTMENT_OTHER): Payer: Medicare Other

## 2015-07-10 ENCOUNTER — Encounter: Payer: Self-pay | Admitting: Oncology

## 2015-07-10 VITALS — BP 154/60 | HR 89 | Temp 98.1°F | Resp 18 | Ht 69.0 in | Wt 219.6 lb

## 2015-07-10 DIAGNOSIS — C50912 Malignant neoplasm of unspecified site of left female breast: Secondary | ICD-10-CM

## 2015-07-10 DIAGNOSIS — J910 Malignant pleural effusion: Secondary | ICD-10-CM

## 2015-07-10 DIAGNOSIS — C7800 Secondary malignant neoplasm of unspecified lung: Secondary | ICD-10-CM

## 2015-07-10 DIAGNOSIS — C7931 Secondary malignant neoplasm of brain: Secondary | ICD-10-CM | POA: Diagnosis not present

## 2015-07-10 DIAGNOSIS — C7951 Secondary malignant neoplasm of bone: Secondary | ICD-10-CM

## 2015-07-10 DIAGNOSIS — Z170 Estrogen receptor positive status [ER+]: Secondary | ICD-10-CM

## 2015-07-10 DIAGNOSIS — Z86711 Personal history of pulmonary embolism: Secondary | ICD-10-CM | POA: Diagnosis not present

## 2015-07-10 DIAGNOSIS — C50811 Malignant neoplasm of overlapping sites of right female breast: Secondary | ICD-10-CM

## 2015-07-10 DIAGNOSIS — C50812 Malignant neoplasm of overlapping sites of left female breast: Principal | ICD-10-CM

## 2015-07-10 DIAGNOSIS — C50911 Malignant neoplasm of unspecified site of right female breast: Secondary | ICD-10-CM

## 2015-07-10 DIAGNOSIS — Z5112 Encounter for antineoplastic immunotherapy: Secondary | ICD-10-CM

## 2015-07-10 DIAGNOSIS — C50919 Malignant neoplasm of unspecified site of unspecified female breast: Secondary | ICD-10-CM

## 2015-07-10 LAB — COMPREHENSIVE METABOLIC PANEL
ALBUMIN: 3.2 g/dL — AB (ref 3.5–5.0)
ALK PHOS: 90 U/L (ref 40–150)
ALT: 17 U/L (ref 0–55)
AST: 26 U/L (ref 5–34)
Anion Gap: 6 mEq/L (ref 3–11)
BILIRUBIN TOTAL: 0.45 mg/dL (ref 0.20–1.20)
BUN: 13.4 mg/dL (ref 7.0–26.0)
CALCIUM: 9.8 mg/dL (ref 8.4–10.4)
CO2: 31 mEq/L — ABNORMAL HIGH (ref 22–29)
CREATININE: 0.8 mg/dL (ref 0.6–1.1)
Chloride: 105 mEq/L (ref 98–109)
EGFR: 79 mL/min/{1.73_m2} — ABNORMAL LOW (ref >90)
GLUCOSE: 108 mg/dL (ref 70–140)
Potassium: 3.9 mEq/L (ref 3.5–5.1)
SODIUM: 142 meq/L (ref 136–145)
TOTAL PROTEIN: 7.9 g/dL (ref 6.4–8.3)

## 2015-07-10 LAB — CBC WITH DIFFERENTIAL/PLATELET
BASO%: 0.7 % (ref 0.0–2.0)
BASOS ABS: 0 10*3/uL (ref 0.0–0.1)
EOS ABS: 0.1 10*3/uL (ref 0.0–0.5)
EOS%: 1.6 % (ref 0.0–7.0)
HEMATOCRIT: 35.3 % (ref 34.8–46.6)
HEMOGLOBIN: 11.8 g/dL (ref 11.6–15.9)
LYMPH#: 1.2 10*3/uL (ref 0.9–3.3)
LYMPH%: 24.2 % (ref 14.0–49.7)
MCH: 30.3 pg (ref 25.1–34.0)
MCHC: 33.4 g/dL (ref 31.5–36.0)
MCV: 90.7 fL (ref 79.5–101.0)
MONO#: 0.7 10*3/uL (ref 0.1–0.9)
MONO%: 13.9 % (ref 0.0–14.0)
NEUT%: 59.6 % (ref 38.4–76.8)
NEUTROS ABS: 3 10*3/uL (ref 1.5–6.5)
Platelets: 179 10*3/uL (ref 145–400)
RBC: 3.89 10*6/uL (ref 3.70–5.45)
RDW: 15.5 % — AB (ref 11.2–14.5)
WBC: 5 10*3/uL (ref 3.9–10.3)

## 2015-07-10 MED ORDER — SODIUM CHLORIDE 0.9 % IV SOLN
Freq: Once | INTRAVENOUS | Status: AC
  Administered 2015-07-10: 11:00:00 via INTRAVENOUS

## 2015-07-10 MED ORDER — HEPARIN SOD (PORK) LOCK FLUSH 100 UNIT/ML IV SOLN
500.0000 [IU] | Freq: Once | INTRAVENOUS | Status: AC | PRN
  Administered 2015-07-10: 500 [IU]
  Filled 2015-07-10: qty 5

## 2015-07-10 MED ORDER — SODIUM CHLORIDE 0.9 % IV SOLN
3.5000 mg/kg | Freq: Once | INTRAVENOUS | Status: AC
  Administered 2015-07-10: 360 mg via INTRAVENOUS
  Filled 2015-07-10: qty 10

## 2015-07-10 MED ORDER — ACETAMINOPHEN 325 MG PO TABS
ORAL_TABLET | ORAL | Status: AC
  Filled 2015-07-10: qty 2

## 2015-07-10 MED ORDER — ACETAMINOPHEN 325 MG PO TABS
650.0000 mg | ORAL_TABLET | Freq: Once | ORAL | Status: AC
  Administered 2015-07-10: 650 mg via ORAL

## 2015-07-10 MED ORDER — DIPHENHYDRAMINE HCL 25 MG PO CAPS
ORAL_CAPSULE | ORAL | Status: AC
Start: 2015-07-10 — End: 2015-07-10
  Filled 2015-07-10: qty 1

## 2015-07-10 MED ORDER — DOCUSATE SODIUM 100 MG PO CAPS: 100.0000 mg | ORAL_CAPSULE | Freq: Two times a day (BID) | ORAL | Status: DC

## 2015-07-10 MED ORDER — DIPHENHYDRAMINE HCL 25 MG PO CAPS
25.0000 mg | ORAL_CAPSULE | Freq: Once | ORAL | Status: AC
  Administered 2015-07-10: 25 mg via ORAL

## 2015-07-10 MED ORDER — SODIUM CHLORIDE 0.9 % IJ SOLN
10.0000 mL | INTRAMUSCULAR | Status: DC | PRN
  Administered 2015-07-10: 10 mL
  Filled 2015-07-10: qty 10

## 2015-07-10 NOTE — Progress Notes (Signed)
Pt does not wish to stay for 30 min observation period post infusion of Kadcyla.  Notified to call us with any questions or concerns.

## 2015-07-10 NOTE — Telephone Encounter (Signed)
Appointments made and avs printed for patient °

## 2015-07-10 NOTE — Patient Instructions (Addendum)
[facility] Discharge Instructions for Patients Receiving Chemotherapy  Today you received the following chemotherapy agents: Kadcyla  To help prevent nausea and vomiting after your treatment, we encourage you to take your nausea medication as directed.    If you develop nausea and vomiting that is not controlled by your nausea medication, call the clinic.   BELOW ARE SYMPTOMS THAT SHOULD BE REPORTED IMMEDIATELY:  *FEVER GREATER THAN 100.5 F  *CHILLS WITH OR WITHOUT FEVER  NAUSEA AND VOMITING THAT IS NOT CONTROLLED WITH YOUR NAUSEA MEDICATION  *UNUSUAL SHORTNESS OF BREATH  *UNUSUAL BRUISING OR BLEEDING  TENDERNESS IN MOUTH AND THROAT WITH OR WITHOUT PRESENCE OF ULCERS  *URINARY PROBLEMS  *BOWEL PROBLEMS  UNUSUAL RASH Items with * indicate a potential emergency and should be followed up as soon as possible.  Feel free to call the clinic you have any questions or concerns. The clinic phone number is [phone].  Please show the CHEMO ALERT CARD at check-in to the Emergency Department and triage nurse.   

## 2015-07-10 NOTE — Progress Notes (Signed)
ID: Linda [name]   DOB: February 09, 1946  MR#: [id number]  LAG#:[id number]  PCP: Leeanne Rio, PA-C GYN: SU: Coralie Keens OTHER MD: Crissie Reese, Linna Hoff Bensimhon  CHIEF COMPLAINT:  Bilateral Breast Cancers  CURRENT TREATMENT: Anastrozole, T-DM 1  BREAST CANCER HISTORY: From the original intake note:  The patient noted a small amount of drainage from her left breast December of 2012. She brought it to her gynecologist's attention in January of 2013 and was set up for bilateral diagnostic mammography at the [facility] Center July 24, 2011. This was the patient's first ever mammogram. It showed a large irregular mass in the left retroareolar region extending to the nipple, with nipple retraction and skin thickening. This measured approximately 8.4 cm. It was associated with pleomorphic microcalcifications. By exam there was moderate distortion and retraction of the nipple with a large palpable ill-defined area in the retroareolar region. In the right right breast there was also a 2 cm hard mass palpated. Ultrasound of the right breast mass showed a complex cystic/solid area measuring 1.9 cm. Ultrasound of the right axilla was negative. Ultrasound of the left breast showed a large hypoechoic mass measuring at least 3.8 cm. The left axilla showed several adjacent abnormal appearing lymph nodes.  With this information biopsy of the right and left breast masses were obtained the same day, and showed ([id number])   (a) on the right, and invasive ductal carcinoma with papillary features, estrogen and progesterone receptor positive, HER-2 negative, with an MIB-1 of 10%.   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 The MIB-1 was 60% for the left-sided tumor.   With this information the patient was presented at the multidisciplinary breast cancer conference 08/06/2011. Subsequent evaluation and  treatments are as detailed below.  INTERVAL HISTORY: Linda [name] returns today for follow up of her stage IV, triple positive breast cancer accompanied by a friend. Today she is due for her current cycle of 2-D and 1. She feels a little tired overnight but the next day she is back to work. She is also on anastrozole. She denies hot flashes, vaginal dryness, or other side effects from that medication. Finally she receives zolendronate every 3 months, last dose mid December. She has no side effects related to that. She gets dental work regularly and understands the need to avoid extractions if at all possible.  REVIEW OF SYSTEMS: Linda [name] enjoyed the holidays. She said it was unusual to have both her children in at the same time and for them to behave. The grandchildren even behaved well, she says. She is working more than 40 hours a week. She exercises by walking. She never did get a fit bit. I suggested she consider a steps program on her computer. She denies unusual headaches, visual changes, nausea, vomiting, dizziness, or gait imbalance. There has been no cough, phlegm production, or pleurisy. She denies chest pain or pressure. A detailed review of systems today was otherwise stable  PAST MEDICAL HISTORY: Past Medical History  Diagnosis Date  . Cancer ([place]) 08-13-11    07-31-11-Dx. Bilarteral Breast cancer-left greater than rt.  . Hematuria, undiagnosed cause 08-13-11    Being evaluated by [facility] 08-14-11  . Hypertension   . Bronchitis     hx  . GERD (gastroesophageal reflux disease)     doing well  . Breast cancer ([place]) 07/30/11 dx    Right  invasive ductal ca 7 0'clock,& left  breast=invasive ductal ca  and dcis, left axilla nlymph node, metastatic ca  . Seroma 02/04/12    right breast  200cc removed  erythema on right side  . Seasonal allergies   . History of radiation therapy 02/20/12-04/15/12    left breast,total 60.4 Gy  . Wears partial dentures     wears upper and lower partial  . Allergy    . Blood transfusion without reported diagnosis   . 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   . Cancer of central portion of left female breast ([place]) 08/01/2011  . S/P radiation therapy 10/09/14-10/27/14    whole brain 37.5Gy/78f  . Acute pulmonary embolism ([place] 12/13/2014  . Acute on chronic diastolic CHF (congestive heart failure) ([place]     PAST SURGICAL HISTORY: Past Surgical History  Procedure Laterality Date  . Child birth  08-13-11    x3 -NVD  . Portacath placement  08/15/2011    Procedure: INSERTION PORT-A-CATH;  Surgeon: DHarl Bowie MD;  Location: [facility];  Service: General;  Laterality: N/A;  . Mastectomy w/ sentinel node biopsy  01/21/2012    Procedure: MASTECTOMY WITH SENTINEL LYMPH NODE BIOPSY;  Surgeon: DHarl Bowie MD;  [facility]  Service: General;  Laterality: Bilateral;  Left modified radical mastectomy, Rt mastectomy with Sentinel lymphnode biospy  . Breast surgery    . Port-a-cath removal Right 12/22/2012    Procedure: REMOVAL PORT-A-CATH;  Surgeon: DHarl Bowie MD;  [facility]  Service: General;  Laterality: Right;  . Subxyphoid pericardial window N/A 12/14/2014    Procedure: SUBXYPHOID PERICARDIAL WINDOW;  Surgeon: CRexene Alberts MD;  [facility]  Service: Thoracic;  Laterality: N/A;  . Tee without cardioversion N/A 12/14/2014    Procedure: TRANSESOPHAGEAL ECHOCARDIOGRAM (TEE);  Surgeon: CRexene Alberts MD;  [place] [facility]  Service: Thoracic;  Laterality: N/A;  . Chest tube insertion Left 12/14/2014    Procedure: CHEST TUBE INSERTION;  Surgeon: CRexene Alberts MD;  [facility];  Service: Thoracic;  Laterality: Left;    FAMILY HISTORY Family History  Problem Relation Age of Onset  . Heart disease Mother   . Cancer Mother 44   breast, , 753deceased  . Heart attack Father   . Cancer Sister 646   breast  . Colon cancer Neg Hx   The patient's  father died from a heart attack at the age of 841 The patient's mother died from apparently heart problems at the age of 847 The patient had no brothers. She has 3 sisters. One of her sisters was diagnosed with breast cancer in her mid 653s The patient does not know if his sister was ever genetically tested. The patient's mother had a mastectomy at the age of 342 presumably for breast cancer. There is no other history of breast or in cancer in the family to her knowledge.   GYNECOLOGIC HISTORY:  She does not recall when she had menarche. She had her first child at age 70 She is GX P3. She underwent menopause in her mid 417s She never took hormone replacement.   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 [place] Son [place] as a f[place]line pilot His wife is  a nurse. Son Shanon Brow lives at [place] and is a Advertising account executive in addition to having the [employer] The patient attends a local [employer] here   ADVANCED DIRECTIVES: Not in place. At the clinic visit 07/10/2015 the patient was given the appropriate forms to complete and notarize at her discretion. She tells me she ihas named her daughter-in-law Jacqlyn Larsen as her healthcare power of attorney  HEALTH MAINTENANCE: Social History  Substance Use Topics  . Smoking status: Never Smoker   . Smokeless tobacco: Never Used  . Alcohol Use: 0.0 oz/week    0 Standard drinks or equivalent per week     Comment: rare- occ.     Colonoscopy: Never  PAP: Feb 2013  Bone density: November 2013, normal  Lipid panel:   Allergies  Allergen Reactions  . Ace Inhibitors Cough    Current Outpatient Prescriptions  Medication Sig Dispense Refill  . anastrozole (ARIMIDEX) 1 MG tablet Take 1 tablet (1 mg total) by mouth daily. 30 tablet 5   . b complex vitamins tablet Take 1 tablet by mouth daily.    . cholecalciferol (VITAMIN D) 1000 UNITS tablet Take 1 tablet (1,000 Units total) by mouth daily. 100 tablet 12  . furosemide (LASIX) 20 MG tablet Take 20 mg by mouth 2 (two) times a week.    . magnesium oxide (MAG-OX) 400 (241.3 MG) MG tablet Take 1 tablet (400 mg total) by mouth daily. 30 tablet 0  . Multiple Vitamins-Iron (MULTIVITAMINS WITH IRON) TABS Take 1 tablet by mouth daily.    . potassium chloride SA (K-DUR,KLOR-CON) 20 MEQ tablet Take 20 mEq by mouth once.     No current facility-administered medications for this visit.   Facility-Administered Medications Ordered in Other Visits  Medication Dose Route Frequency Provider Last Rate Last Dose  . sodium chloride 0.9 % injection 10 mL  10 mL Intracatheter PRN Chauncey Cruel, MD   10 mL at 06/19/15 1554    OBJECTIVE: [name] in no acute distres0 Filed Vitals:   07/10/15 0942  BP: 154/60  Pulse: 89  Temp: 98.1 F (36.7 C)  Resp: 18     Body mass index is 32.41 kg/(m^2).    ECOG FS: 0 Filed Weights   07/10/15 0942  Weight: 219 lb 9.6 oz (99.61 kg)     Sclerae unicteric, EOMs intact Oropharynx clear, dentition in good repair No cervical or supraclavicular adenopathy Lungs no rales or rhonchi Heart regular rate and rhythm Abd soft, nontender, positive bowel sounds MSK no focal spinal tenderness, no upper extremity lymphedema Neuro: nonfocal, well oriented, appropriate affect Breasts: Deferred   LAB RESULTS: CBC Latest Ref Rng 07/10/2015 06/19/2015 05/28/2015  WBC 3.9 - 10.3 10e3/uL 5.0 5.9 6.5  Hemoglobin 11.6 - 15.9 g/dL 11.8 12.0 12.3  Hematocrit 34.8 - 46.6 % 35.3 36.5 37.4  Platelets 145 - 400 10e3/uL 179 195 192   CMP Latest Ref Rng 06/19/2015 05/28/2015 05/08/2015  Glucose 70 - 140 mg/dl 91 118 130  BUN 7.0 - 26.0 mg/dL 16.8 13.3 12.5  Creatinine 0.6 - 1.1 mg/dL 0.7 0.8 0.8  Sodium 136 - 145 mEq/L 141 140 139  Potassium 3.5 - 5.1  mEq/L 3.8 3.8 3.9  Chloride 101 - 111 mmol/L - - -  CO2 22 - 29 mEq/L 27 27 23   Calcium 8.4 - 10.4 mg/dL 9.4 9.9 9.7  Total Protein 6.4 - 8.3 g/dL 8.1 7.8 7.6  Total Bilirubin 0.20 - 1.20 mg/dL 0.41 0.30 0.42  Alkaline Phos 40 - 150 U/L 82 79 76  AST 5 - 34 U/L 29 25 26   ALT 0 - 55 U/L 19 16 16     STUDIES: .Dg Chest 2 View  06/15/2015  CLINICAL DATA:  History of breast carcinoma.  Hypertension. EXAM: CHEST  2 VIEW COMPARISON:  Chest radiograph January 01, 2015 and chest CT April 13, 2015 FINDINGS: There is no edema or consolidation. Heart size and pulmonary vascularity are normal. No pleural effusions. No adenopathy. Patient is status post mastectomies. There are surgical clips in left axillary region. Port-A-Cath tip is at the cavoatrial junction. No pneumothorax. There is degenerative change in the thoracic spine. There are no blastic or lytic bone lesions. IMPRESSION: No edema or consolidation. No adenopathy or effusions. Postoperative change with evidence of bilateral mastectomies. Port-A-Cath tip at cavoatrial junction without pneumothorax. Electronically Signed   By: Lowella Grip III M.D.   On: 06/15/2015 10:54   Mr Jeri Cos HX Contrast  06/29/2015  ADDENDUM REPORT: 06/29/2015 16:53 ADDENDUM: Left mastoid sinus effusion unchanged. Small right mastoid sinus effusion unchanged. Minimal mucosal edema in the paranasal sinuses. Electronically Signed   By: Franchot Gallo M.D.   On: 06/29/2015 16:53  06/29/2015  CLINICAL DATA:  Metastatic breast cancer. History of metastatic disease to brain. EXAM: MRI HEAD WITHOUT AND WITH CONTRAST TECHNIQUE: Multiplanar, multiecho pulse sequences of the brain and surrounding structures were obtained without and with intravenous contrast. CONTRAST:  45m MULTIHANCE GADOBENATE DIMEGLUMINE 529 MG/ML IV SOLN COMPARISON:  MRI head 03/18/2015, 01/07/2015, 09/25/2014 FINDINGS: Metastatic disease to the brain on the prior MRI of 09/25/2014 has resolved. Currently, no  enhancing lesions in the brain. 11 mm enhancing lesion left frontal bone is unchanged from prior studies and may be treated metastatic disease. No other skull lesions identified. Lesion in the C4 vertebral body consistent with metastatic disease. No cord compression. Negative for hydrocephalus. Diffuse cerebral white matter hyperintensity has progressed since the most recent study and is most compatible with chemo and radiation. Negative for intracranial hemorrhage.  No acute infarct. IMPRESSION: Previously noted metastatic disease to the brain has resolved. No evidence of metastatic disease to brain on the current study Enhancing 11 mm lesion left frontal bone consistent metastatic disease which is stable. There is also metastatic disease C4 vertebral body. Electronically Signed: By: CFranchot GalloM.D. On: 06/29/2015 16:33    ASSESSMENT: 70y.o.   woman   (1)  status post bilateral breast biopsies 07/24/2011, showing,      (a) on the right, a clinical T2 N0 papillary/ductal breast cancer, estrogen and progesterone receptor positive, HER-2 negative, with an MIB-1 of 10%;     (b) on the left, a clinical T3 N1, stage IIIA invasive ductal carcinoma, grade 3, triple positive, with an MIB-1 of 60%.  (2)  Status post 4 dose dense cycles of doxorubicin/ cyclophosphamide followed by 4 dose dense cycles of paclitaxel and trastuzumab completed 12/09/2011  (3) the trastuzumab was continued for a total of one year (to 11/01/2012). Final echo on 11/04/2012 showing a well preserved ejection fraction of 55-60%.  (4) s/p bilateral mastectomies 01/21/2012 showing   (a) on the Right, an 8 mm invasive papillary carcinoma, grade 1, ypT1b ypN0   (b) on the Left, multiple microscopic foci of residual  Invasive ductal carcinoma with evidence of dermal lymphatic involvement, pyT1a/T4 pyN0  (5)  Postmastectomy radiation, completed 04/15/2012  (6) Started anastrazole 04/16/2012; normal dexa scan [facility]/19/2014  [facility]  brain, bones, left effusion (7) CT angiogram 09/11/2014 shows new left pericardial effusion and new mediastinal and hilar lymphadenopathy; there were no suspicious upper abdominal findings; Cytology from the left effusion 09/11/2014 ([id number]) shows malignant cells which are HER-2 positive  (8) Whole body bone scan on 09/25/14 showed metastatic foci throughout axial and appendicular skeleton. Areas of most potential concern include disease in the thoracic spine, disease in the right acetabulum, right superior pubic ramus and possibly proximal right femur, disease in the right humeral shaft and in both femoral shafts.  (8) Started trastuzumab/pertuzumab 09/26/2014, discontinued after 11/29/2014 dose, w progression  (a) T-DM1 started 01/02/2015  (b) echocardiogram 07/02/2015 showed an ejection fraction between 60 and 65%  (9) Brain MRI on 09/25/14 was positive for numerous small enhancing brain mets and calvarium/bone mets   (a) whole brain radiation on 10/09/14--10/27/2014  (b) brain MRI 01/07/2015 shows a complete clinical response  (c) REPEAT BRAIN mri 03/18/2015 SHOWS NO NEW LESIONS  (d) repeat brain MRI with and without contrast 06/29/2015 continues to show no recurrent lesions in the brain  (10) started zolendronate 11/29/2014, to be repeated every 12 weeks, most recent dose 05/28/2015  11) left pleural effusion re-tapped 12/08/2014 (a) left chest tube placement 12/14/2014, removedd 12/21/2014  (12) large pericardial effusion per echo 12/13/2014  (a) pericardial window placement 12/14/2014; fluid is hemorrhagic; path negative  (13) Right-sided pulmonary emboli noted on CT scan 12/13/2014  (a) no pulmonary emboli demonstrated on non-dedicated chest CT 04/13/2015  PLAN: Linda [name] continues to do remarkably well, now nearly a year out from diagnosis of her widely metastatic disease, with no evidence of active cancer  at present. The negative brain scan is particularly gratifying.  She maintains an excellent ejection fraction and functional status. In fact she is working full-time again. She is also exercising regularly.  We're going to continue the T-DM1 every 3 weeks indefinitely, with anastrozole concurrently. We will repeat an echo in 3 months and I'm not repeating a brain MRI in the absence of symptoms until June. We will obtain a chest x-ray in March.  Linda [name] has a good understanding of this plan. She agrees with it. She knows to call for any problems that may develop before her next visit here.  Chauncey Cruel, MD

## 2015-07-31 ENCOUNTER — Ambulatory Visit: Payer: Self-pay | Admitting: Nurse Practitioner

## 2015-07-31 ENCOUNTER — Other Ambulatory Visit: Payer: Self-pay

## 2015-08-01 ENCOUNTER — Ambulatory Visit (HOSPITAL_BASED_OUTPATIENT_CLINIC_OR_DEPARTMENT_OTHER): Payer: Medicare Other

## 2015-08-01 ENCOUNTER — Ambulatory Visit (HOSPITAL_BASED_OUTPATIENT_CLINIC_OR_DEPARTMENT_OTHER): Payer: Medicare Other | Admitting: Nurse Practitioner

## 2015-08-01 ENCOUNTER — Encounter: Payer: Self-pay | Admitting: Nurse Practitioner

## 2015-08-01 ENCOUNTER — Telehealth: Payer: Self-pay | Admitting: Oncology

## 2015-08-01 ENCOUNTER — Other Ambulatory Visit (HOSPITAL_BASED_OUTPATIENT_CLINIC_OR_DEPARTMENT_OTHER): Payer: Medicare Other

## 2015-08-01 ENCOUNTER — Ambulatory Visit: Payer: Self-pay

## 2015-08-01 ENCOUNTER — Other Ambulatory Visit: Payer: Self-pay | Admitting: *Deleted

## 2015-08-01 VITALS — BP 136/57 | HR 87 | Temp 98.3°F | Resp 18 | Ht 69.0 in | Wt 215.6 lb

## 2015-08-01 DIAGNOSIS — Z170 Estrogen receptor positive status [ER+]: Secondary | ICD-10-CM

## 2015-08-01 DIAGNOSIS — J910 Malignant pleural effusion: Secondary | ICD-10-CM | POA: Diagnosis not present

## 2015-08-01 DIAGNOSIS — C50912 Malignant neoplasm of unspecified site of left female breast: Secondary | ICD-10-CM | POA: Diagnosis not present

## 2015-08-01 DIAGNOSIS — C7931 Secondary malignant neoplasm of brain: Secondary | ICD-10-CM

## 2015-08-01 DIAGNOSIS — C7951 Secondary malignant neoplasm of bone: Secondary | ICD-10-CM

## 2015-08-01 DIAGNOSIS — Z86711 Personal history of pulmonary embolism: Secondary | ICD-10-CM | POA: Diagnosis not present

## 2015-08-01 DIAGNOSIS — C50812 Malignant neoplasm of overlapping sites of left female breast: Principal | ICD-10-CM

## 2015-08-01 DIAGNOSIS — Z5112 Encounter for antineoplastic immunotherapy: Secondary | ICD-10-CM | POA: Diagnosis present

## 2015-08-01 DIAGNOSIS — C50811 Malignant neoplasm of overlapping sites of right female breast: Secondary | ICD-10-CM

## 2015-08-01 DIAGNOSIS — C50911 Malignant neoplasm of unspecified site of right female breast: Secondary | ICD-10-CM

## 2015-08-01 LAB — CBC WITH DIFFERENTIAL/PLATELET
BASO%: 0.5 % (ref 0.0–2.0)
BASOS ABS: 0 10*3/uL (ref 0.0–0.1)
EOS%: 0.9 % (ref 0.0–7.0)
Eosinophils Absolute: 0 10*3/uL (ref 0.0–0.5)
HEMATOCRIT: 36.2 % (ref 34.8–46.6)
HGB: 11.9 g/dL (ref 11.6–15.9)
LYMPH%: 29.5 % (ref 14.0–49.7)
MCH: 30 pg (ref 25.1–34.0)
MCHC: 32.7 g/dL (ref 31.5–36.0)
MCV: 91.5 fL (ref 79.5–101.0)
MONO#: 0.6 10*3/uL (ref 0.1–0.9)
MONO%: 10.8 % (ref 0.0–14.0)
NEUT#: 3.1 10*3/uL (ref 1.5–6.5)
NEUT%: 58.3 % (ref 38.4–76.8)
PLATELETS: 168 10*3/uL (ref 145–400)
RBC: 3.96 10*6/uL (ref 3.70–5.45)
RDW: 15.2 % — ABNORMAL HIGH (ref 11.2–14.5)
WBC: 5.4 10*3/uL (ref 3.9–10.3)
lymph#: 1.6 10*3/uL (ref 0.9–3.3)

## 2015-08-01 LAB — COMPREHENSIVE METABOLIC PANEL
ALT: 19 U/L (ref 0–55)
ANION GAP: 7 meq/L (ref 3–11)
AST: 31 U/L (ref 5–34)
Albumin: 3.2 g/dL — ABNORMAL LOW (ref 3.5–5.0)
Alkaline Phosphatase: 97 U/L (ref 40–150)
BUN: 14.6 mg/dL (ref 7.0–26.0)
CALCIUM: 9.4 mg/dL (ref 8.4–10.4)
CHLORIDE: 105 meq/L (ref 98–109)
CO2: 31 meq/L — AB (ref 22–29)
Creatinine: 0.8 mg/dL (ref 0.6–1.1)
EGFR: 79 mL/min/{1.73_m2} — AB (ref >90)
Glucose: 125 mg/dl (ref 70–140)
POTASSIUM: 4.1 meq/L (ref 3.5–5.1)
Sodium: 143 mEq/L (ref 136–145)
Total Bilirubin: 0.36 mg/dL (ref 0.20–1.20)
Total Protein: 8 g/dL (ref 6.4–8.3)

## 2015-08-01 MED ORDER — SODIUM CHLORIDE 0.9 % IJ SOLN
10.0000 mL | INTRAMUSCULAR | Status: DC | PRN
  Administered 2015-08-01: 10 mL
  Filled 2015-08-01: qty 10

## 2015-08-01 MED ORDER — ACETAMINOPHEN 325 MG PO TABS
ORAL_TABLET | ORAL | Status: AC
  Filled 2015-08-01: qty 2

## 2015-08-01 MED ORDER — ANASTROZOLE 1 MG PO TABS: 1.0000 mg | ORAL_TABLET | Freq: Every day | ORAL | Status: DC

## 2015-08-01 MED ORDER — ADO-TRASTUZUMAB EMTANSINE CHEMO INJECTION 160 MG
3.5000 mg/kg | Freq: Once | INTRAVENOUS | Status: AC
  Administered 2015-08-01: 360 mg via INTRAVENOUS
  Filled 2015-08-01: qty 13

## 2015-08-01 MED ORDER — HEPARIN SOD (PORK) LOCK FLUSH 100 UNIT/ML IV SOLN
500.0000 [IU] | Freq: Once | INTRAVENOUS | Status: AC | PRN
  Administered 2015-08-01: 500 [IU]
  Filled 2015-08-01: qty 5

## 2015-08-01 MED ORDER — DIPHENHYDRAMINE HCL 25 MG PO CAPS
ORAL_CAPSULE | ORAL | Status: AC
  Filled 2015-08-01: qty 1

## 2015-08-01 MED ORDER — ACETAMINOPHEN 325 MG PO TABS
650.0000 mg | ORAL_TABLET | Freq: Once | ORAL | Status: AC
  Administered 2015-08-01: 650 mg via ORAL

## 2015-08-01 MED ORDER — DIPHENHYDRAMINE HCL 25 MG PO CAPS
25.0000 mg | ORAL_CAPSULE | Freq: Once | ORAL | Status: AC
  Administered 2015-08-01: 25 mg via ORAL

## 2015-08-01 MED ORDER — SODIUM CHLORIDE 0.9 % IV SOLN
Freq: Once | INTRAVENOUS | Status: AC
  Administered 2015-08-01: 14:00:00 via INTRAVENOUS

## 2015-08-01 MED ORDER — HYDROCODONE-HOMATROPINE 5-1.5 MG/5ML PO SYRP: 5.0000 mL | ORAL_SOLUTION | Freq: Four times a day (QID) | ORAL | Status: DC | PRN

## 2015-08-01 NOTE — Telephone Encounter (Signed)
Added f/u to 5/9 lab/tx per 2/15 pof. Gave patient avs report and appointments for March thru May.

## 2015-08-01 NOTE — Progress Notes (Signed)
ID: Linda [name]   DOB: 1946-05-13  MR#: [id number]  VOJ#:[id number]  PCP: Leeanne Rio, PA-C GYN: SU: Coralie Keens OTHER MD: Crissie Reese, Linna Hoff Bensimhon  CHIEF COMPLAINT:  Bilateral Breast Cancers  CURRENT TREATMENT: Anastrozole, T-DM 1, zolendrnate  BREAST CANCER HISTORY: From the original intake note:  The patient noted a small amount of drainage from her left breast December of 2012. She brought it to her gynecologist's attention in January of 2013 and was set up for bilateral diagnostic mammography at the breast Center July 24, 2011. This was the patient's first ever mammogram. It showed a large irregular mass in the left retroareolar region extending to the nipple, with nipple retraction and skin thickening. This measured approximately 8.4 cm. It was associated with pleomorphic microcalcifications. By exam there was moderate distortion and retraction of the nipple with a large palpable ill-defined area in the retroareolar region. In the right right breast there was also a 2 cm hard mass palpated. Ultrasound of the right breast mass showed a complex cystic/solid area measuring 1.9 cm. Ultrasound of the right axilla was negative. Ultrasound of the left breast showed a large hypoechoic mass measuring at least 3.8 cm. The left axilla showed several adjacent abnormal appearing lymph nodes.  With this information biopsy of the right and left breast masses were obtained the same day, and showed ([id number])   (a) on the right, and invasive ductal carcinoma with papillary features, estrogen and progesterone receptor positive, HER-2 negative, with an MIB-1 of 10%.   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 The MIB-1 was 60% for the left-sided tumor.   With this information the patient was presented at the multidisciplinary breast cancer conference 08/06/2011. Subsequent  evaluation and treatments are as detailed below.  INTERVAL HISTORY: Linda [name] returns today for follow up of her metastatic breast cancer, accompanied by a friend. Today she is due for cycle 11 of T-DM 1, given every 21 days. She is also on anastrozole daily. She tolerates both drug well with no side effects that she is aware of. She is next due for zometa in March. The interval history is completely unremarkable.   REVIEW OF SYSTEMS: Linda [name] has few physical complaints today. She denies fevers, chills, nausea, or vomiting. She does have a slight cough. She is taking just 1 colace daily to avoid constipation. Her appetite is good. She denies shortness of breath, chest pain, cough, or palpitations. She continues to walk for exercise. Her hair is thinning, and this is visually frustrating to her. She denies headaches, dizziness, or vision changes. She has no swelling. A detailed review of systems is otherwise stable.  PAST MEDICAL HISTORY: Past Medical History  Diagnosis Date  . Cancer ([place]) 08-13-11    07-31-11-Dx. Bilarteral Breast cancer-left greater than rt.  . Hematuria, undiagnosed cause 08-13-11    Being evaluated by [facility] 08-14-11  . Hypertension   . Bronchitis     hx  . GERD (gastroesophageal reflux disease)     doing well  . Breast cancer ([place]) 07/30/11 dx    Right  invasive ductal ca 7 0'clock,& left breast=invasive ductal ca  and dcis, left axilla nlymph node, metastatic ca  . Seroma 02/04/12    right breast  200cc removed  erythema on right side  . Seasonal allergies   . History of radiation therapy 02/20/12-04/15/12    left breast,total 60.4 Gy  . Wears  partial dentures     wears upper and lower partial  . Allergy   . Blood transfusion without reported diagnosis   . 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   . Cancer of central portion of left female breast ([place]) 08/01/2011  . S/P radiation therapy 10/09/14-10/27/14    whole brain  37.5Gy/74f  . [place] 12/13[place]2014  . Acute on chronic diastolic CHF (congestive heart failure) ([facility]     PAST SURGICAL HISTORY: Past Surgical History  Procedure Laterality Date  . Child birth  08-13-11    x3 -NVD  . Portacath placement  08/15/2011    Procedure: INSERTION PORT-A-CATH;  Surgeon: DHarl Bowie MD;  Location: [facility];  Service: General;  Laterality: N/A;  . Mastectomy w/ sentinel node biopsy  01/21/2012    Procedure: MASTECTOMY WITH SENTINEL LYMPH NODE BIOPSY;  Surgeon: DHarl Bowie MD;  [facility]  Service: General;  Laterality: Bilateral;  Left modified radical mastectomy, Rt mastectomy with Sentinel lymphnode biospy  . Breast surgery    . Port-a-cath removal Right 12/22/2012    Procedure: REMOVAL PORT-A-CATH;  Surgeon: DHarl Bowie MD;  [facility]  Service: General;  Laterality: Right;  . Subxyphoid pericardial window N/A 12/14/2014    Procedure: SUBXYPHOID PERICARDIAL WINDOW;  Surgeon: CRexene Alberts MD;  [facility]  Service: Thoracic;  Laterality: N/A;  . Tee without cardioversion N/A 12/14/2014    Procedure: TRANSESOPHAGEAL ECHOCARDIOGRAM (TEE);  Surgeon: CRexene Alberts MD;  [facility]  Service: Thoracic;  Laterality: N/A;  . Chest tube insertion Left 12/14/2014    Procedure: CHEST TUBE INSERTION;  Surgeon: CRexene Alberts MD;  [facility];  Service: Thoracic;  Laterality: Left;    FAMILY HISTORY Family History  Problem Relation Age of Onset  . Heart disease Mother   . Cancer Mother 429   breast, , [id number]  . Heart attack Father   . Cancer Sister 622   breast  . Colon cancer Neg Hx   The patient's father died from a heart attack at the age of 866 The patient's mother died from apparently heart problems at the age of 899 The patient had no brothers. She has 3 sisters. One of her sisters was diagnosed with breast cancer in her mid 62s The patient does not  know if his sister was ever genetically tested. The patient's mother had a mastectomy at the age of 364 presumably for breast cancer. There is no other history of breast or in cancer in the family to her knowledge.   GYNECOLOGIC HISTORY:  She does not recall when she had menarche. She had her first child at age 89 She is GX P3. She underwent menopause in her mid 461s She never took hormone replacement.   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 [place] has a cGames developerin [place] Son CGerald Stab[place]ives [place] a fAirline pilot His wife is a nMarine scientist Son DShanon Browlives at [employer]and is a dAdvertising account executivein addition to having the mTenneco Inc The patient attends a local BLehman Brothershere   ADVANCED DIRECTIVES: Not in place. At the clinic visit 08/01/2015 the patient was given the appropriate forms to complete  and notarize at her discretion. She tells me she ihas named her daughter-in-law Jacqlyn Larsen as her healthcare power of attorney  HEALTH MAINTENANCE: Social History  Substance Use Topics  . Smoking status: Never Smoker   . Smokeless tobacco: Never Used  . Alcohol Use: 0.0 oz/week    0 Standard drinks or equivalent per week     Comment: rare- occ.     Colonoscopy: Never  PAP: Feb 2013  Bone density: November 2013, normal  Lipid panel:   Allergies  Allergen Reactions  . Ace Inhibitors Cough    Current Outpatient Prescriptions  Medication Sig Dispense Refill  . anastrozole (ARIMIDEX) 1 MG tablet Take 1 tablet (1 mg total) by mouth daily. 30 tablet 5  . b complex vitamins tablet Take 1 tablet by mouth daily.    . cholecalciferol (VITAMIN D) 1000 UNITS tablet Take 1 tablet (1,000 Units total) by mouth daily. 100 tablet 12  . docusate sodium (COLACE) 100 MG capsule Take 1 capsule (100 mg total) by mouth 2  (two) times daily. 10 capsule 0  . furosemide (LASIX) 20 MG tablet Take 20 mg by mouth 2 (two) times a week.    . magnesium oxide (MAG-OX) 400 (241.3 MG) MG tablet Take 1 tablet (400 mg total) by mouth daily. 30 tablet 0  . Multiple Vitamins-Iron (MULTIVITAMINS WITH IRON) TABS Take 1 tablet by mouth daily.    . potassium chloride SA (K-DUR,KLOR-CON) 20 MEQ tablet Take 20 mEq by mouth once.    [name] HYDROcodone-homatropine (HYCODAN) 5-1.5 MG/5ML syrup Take 5 mLs by mouth every 6 (six) hours as needed for cough. 240 mL 0   No current facility-administered medications for this visit.   Facility-Administered Medications Ordered in Other Visits  Medication Dose Route Frequency Provider Last Rate Last Dose  . sodium chloride 0.9 % injection 10 mL  10 mL Intracatheter PRN Chauncey Cruel, MD   10 mL at 06/19/15 1554    OBJECTIVE: [name] in no acute distres0 Filed Vitals:   08/01/15 1306  BP: 136/57  Pulse: 87  Temp: 98.3 F (36.8 C)  Resp: 18     Body mass index is 31.82 kg/(m^2).    ECOG FS: 0 Filed Weights   08/01/15 1306  Weight: 215 lb 9.6 oz (97.796 kg)     Sclerae unicteric, EOMs intact Oropharynx clear, dentition in good repair No cervical or supraclavicular adenopathy Lungs no rales or rhonchi Heart regular rate and rhythm Abd soft, nontender, positive bowel sounds MSK no focal spinal tenderness, no upper extremity lymphedema Neuro: nonfocal, well oriented, appropriate affect Breasts: Deferred   LAB RESULTS: CBC Latest Ref Rng 08/01/2015 07/10/2015 06/19/2015  WBC 3.9 - 10.3 10e3/uL 5.4 5.0 5.9  Hemoglobin 11.6 - 15.9 g/dL 11.9 11.8 12.0  Hematocrit 34.8 - 46.6 % 36.2 35.3 36.5  Platelets 145 - 400 10e3/uL 168 179 195   CMP Latest Ref Rng 08/01/2015 07/10/2015 06/19/2015  Glucose 70 - 140 mg/dl 125 108 91  BUN 7.0 - 26.0 mg/dL 14.6 13.4 16.8  Creatinine 0.6 - 1.1 mg/dL 0.8 0.8 0.7  Sodium 136 - 145 mEq/L 143 142 141  Potassium 3.5 - 5.1 mEq/L 4.1 3.9 3.8   CO2 22 - 29 mEq/L 31(H) 31(H) 27  Calcium 8.4 - 10.4 mg/dL 9.4 9.8 9.4  Total Protein 6.4 - 8.3 g/dL 8.0 7.9 8.1  Total Bilirubin 0.20 - 1.20 mg/dL 0.36 0.45 0.41  Alkaline Phos 40 - 150 U/L 97 90 82  AST 5 -  34 U/L _0 ALT 0 - 55 U/L _1 STUDIES: .No results found.  ASSESSMENT: 70 y.o.  [place] woman   (1)  status post bilateral breast biopsies 07/24/2011, showing,      (a) on the right, a clinical T2 N0 papillary/ductal breast cancer, estrogen and progesterone receptor positive, HER-2 negative, with an MIB-1 of 10%;     (b) on the left, a clinical T3 N1, stage IIIA invasive ductal carcinoma, grade 3, triple positive, with an MIB-1 of 60%.  (2)  Status post 4 dose dense cycles of doxorubicin/ cyclophosphamide followed by 4 dose dense cycles of paclitaxel and trastuzumab completed 12/09/2011  (3) the trastuzumab was continued for a total of one year (to 11/01/2012). Final echo on 11/04/2012 showing a well preserved ejection fraction of 55-60%.  (4) s/p bilateral mastectomies 01/21/2012 showing   (a) on the Right, an 8 mm invasive papillary carcinoma, grade 1, ypT1b ypN0   (b) on the Left, multiple microscopic foci of residual  Invasive ductal carcinoma with evidence of dermal lymphatic involvement, pyT1a/T4 pyN0  (5)  Postmastectomy radiation, completed 04/15/2012  (6) Started anastrazole 04/16/2012; normal dexa scan 05/19/2014 at the [facility] 09/11/2014: brain, bones, left effusion (7) CT angiogram 09/11/2014 shows new left pericardial effusion and new mediastinal and hilar lymphadenopathy; there were no suspicious upper abdominal findings; Cytology from the left effusion 09/11/2014 ([id number]) shows malignant cells which are HER-2 positive  (8) Whole body bone scan on 09/25/14 showed metastatic foci throughout axial and appendicular skeleton. Areas of most potential concern include disease in the thoracic spine, disease in the right  acetabulum, right superior pubic ramus and possibly proximal right femur, disease in the right humeral shaft and in both femoral shafts.  (8) Started trastuzumab/pertuzumab 09/26/2014, discontinued after 11/29/2014 dose, w progression  (a) T-DM1 started 01/02/2015  (b) echocardiogram 07/02/2015 showed an ejection fraction between 60 and 65%  (9) Brain MRI on 09/25/14 was positive for numerous small enhancing brain mets and calvarium/bone mets   (a) whole brain radiation on 10/09/14--10/27/2014  (b) brain MRI 01/07/2015 shows a complete clinical response  (c) REPEAT BRAIN mri 03/18/2015 SHOWS NO NEW LESIONS  (d) repeat brain MRI with and without contrast 06/29/2015 continues to show no recurrent lesions in the brain  (10) started zolendronate 11/29/2014, to be repeated every 12 weeks, most recent dose 05/28/2015  11) left pleural effusion re-tapped 12/08/2014 (a) left chest tube placement 12/14/2014, removedd 12/21/2014  (12) large pericardial effusion per echo 12/13/2014  (a) pericardial window placement 12/14/2014; fluid is hemorrhagic; path negative  (13) Right-sided pulmonary emboli noted on CT scan 12/13/2014  (a) no pulmonary emboli demonstrated on non-dedicated chest CT 04/13/2015  PLAN: Linda [name] continues to perform well. She is tolerating treatments with no complaint. We have achieved good control of her metastatic disease with no evidence of progression in the past 6 months. We will continue the anastrozole and T-DM1 indefinitely. Her next dose of Zometa is due in March.   Linda [name] will continue treatment every 3 weeks. Her next follow up visit will be in 6 weeks. Prior to this time she will obtain a chest xray. Her next echocardiogram will be due in April. She understands and agrees with this plan. She knows the goal of treatment in her case is control. She has been encouraged to call with any issues that might arise before her next visit here.   Laurie Panda,  NP

## 2015-08-01 NOTE — Patient Instructions (Signed)
[facility] Discharge Instructions for Patients Receiving Chemotherapy  Today you received the following chemotherapy agents: Kadcyla  To help prevent nausea and vomiting after your treatment, we encourage you to take your nausea medication as directed.    If you develop nausea and vomiting that is not controlled by your nausea medication, call the clinic.   BELOW ARE SYMPTOMS THAT SHOULD BE REPORTED IMMEDIATELY:  *FEVER GREATER THAN 100.5 F  *CHILLS WITH OR WITHOUT FEVER  NAUSEA AND VOMITING THAT IS NOT CONTROLLED WITH YOUR NAUSEA MEDICATION  *UNUSUAL SHORTNESS OF BREATH  *UNUSUAL BRUISING OR BLEEDING  TENDERNESS IN MOUTH AND THROAT WITH OR WITHOUT PRESENCE OF ULCERS  *URINARY PROBLEMS  *BOWEL PROBLEMS  UNUSUAL RASH Items with * indicate a potential emergency and should be followed up as soon as possible.  Feel free to call the clinic you have any questions or concerns. The clinic phone number is [phone].  Please show the CHEMO ALERT CARD at check-in to the Emergency Department and triage nurse.   

## 2015-08-14 ENCOUNTER — Telehealth: Payer: Self-pay | Admitting: *Deleted

## 2015-08-14 DIAGNOSIS — C50911 Malignant neoplasm of unspecified site of right female breast: Secondary | ICD-10-CM

## 2015-08-14 MED ORDER — ANASTROZOLE 1 MG PO TABS: 1.0000 mg | ORAL_TABLET | Freq: Every day | ORAL | Status: DC

## 2015-08-14 MED FILL — ANASTROZOLE 1 MG TABLET: 1 | 30 days supply | Qty: 30 | Fill #0

## 2015-08-14 NOTE — Telephone Encounter (Signed)
"  I do not have a medication card yet from [employer] [facility]. I need a thirty day refill for Arimidex sent to [facility] to pay out of pocket until I have a card for future refills."

## 2015-08-21 ENCOUNTER — Other Ambulatory Visit: Payer: Self-pay | Admitting: Oncology

## 2015-08-21 ENCOUNTER — Ambulatory Visit (HOSPITAL_COMMUNITY)
Admission: RE | Admit: 2015-08-21 | Discharge: 2015-08-21 | Disposition: A | Discharge status: Home / Self Care | Payer: Medicare Other | Source: Ambulatory Visit | Attending: Oncology | Admitting: Oncology

## 2015-08-21 ENCOUNTER — Other Ambulatory Visit (HOSPITAL_BASED_OUTPATIENT_CLINIC_OR_DEPARTMENT_OTHER): Payer: Medicare Other

## 2015-08-21 ENCOUNTER — Ambulatory Visit (HOSPITAL_BASED_OUTPATIENT_CLINIC_OR_DEPARTMENT_OTHER): Payer: Medicare Other

## 2015-08-21 VITALS — BP 143/67 | HR 86 | Temp 97.6°F | Resp 16

## 2015-08-21 DIAGNOSIS — Z5112 Encounter for antineoplastic immunotherapy: Secondary | ICD-10-CM

## 2015-08-21 DIAGNOSIS — J910 Malignant pleural effusion: Secondary | ICD-10-CM | POA: Diagnosis not present

## 2015-08-21 DIAGNOSIS — C50912 Malignant neoplasm of unspecified site of left female breast: Secondary | ICD-10-CM

## 2015-08-21 DIAGNOSIS — C50919 Malignant neoplasm of unspecified site of unspecified female breast: Secondary | ICD-10-CM | POA: Diagnosis present

## 2015-08-21 DIAGNOSIS — C7800 Secondary malignant neoplasm of unspecified lung: Secondary | ICD-10-CM | POA: Insufficient documentation

## 2015-08-21 DIAGNOSIS — C50812 Malignant neoplasm of overlapping sites of left female breast: Secondary | ICD-10-CM | POA: Insufficient documentation

## 2015-08-21 DIAGNOSIS — C7931 Secondary malignant neoplasm of brain: Secondary | ICD-10-CM | POA: Insufficient documentation

## 2015-08-21 DIAGNOSIS — C50911 Malignant neoplasm of unspecified site of right female breast: Secondary | ICD-10-CM | POA: Diagnosis present

## 2015-08-21 DIAGNOSIS — C50811 Malignant neoplasm of overlapping sites of right female breast: Secondary | ICD-10-CM | POA: Insufficient documentation

## 2015-08-21 DIAGNOSIS — C7951 Secondary malignant neoplasm of bone: Secondary | ICD-10-CM | POA: Diagnosis not present

## 2015-08-21 LAB — CBC WITH DIFFERENTIAL/PLATELET
BASO%: 0.6 % (ref 0.0–2.0)
BASOS ABS: 0 10*3/uL (ref 0.0–0.1)
EOS%: 1.3 % (ref 0.0–7.0)
Eosinophils Absolute: 0.1 10*3/uL (ref 0.0–0.5)
HEMATOCRIT: 36.2 % (ref 34.8–46.6)
HEMOGLOBIN: 11.8 g/dL (ref 11.6–15.9)
LYMPH%: 33.3 % (ref 14.0–49.7)
MCH: 30 pg (ref 25.1–34.0)
MCHC: 32.5 g/dL (ref 31.5–36.0)
MCV: 92.2 fL (ref 79.5–101.0)
MONO#: 0.5 10*3/uL (ref 0.1–0.9)
MONO%: 9.7 % (ref 0.0–14.0)
NEUT%: 55.1 % (ref 38.4–76.8)
NEUTROS ABS: 2.8 10*3/uL (ref 1.5–6.5)
Platelets: 138 10*3/uL — ABNORMAL LOW (ref 145–400)
RBC: 3.92 10*6/uL (ref 3.70–5.45)
RDW: 15.9 % — AB (ref 11.2–14.5)
WBC: 5.1 10*3/uL (ref 3.9–10.3)
lymph#: 1.7 10*3/uL (ref 0.9–3.3)

## 2015-08-21 LAB — COMPREHENSIVE METABOLIC PANEL
ALBUMIN: 3.4 g/dL — AB (ref 3.5–5.0)
ALT: 22 U/L (ref 0–55)
AST: 34 U/L (ref 5–34)
Alkaline Phosphatase: 89 U/L (ref 40–150)
Anion Gap: 9 mEq/L (ref 3–11)
BUN: 14.7 mg/dL (ref 7.0–26.0)
CALCIUM: 9.8 mg/dL (ref 8.4–10.4)
CHLORIDE: 101 meq/L (ref 98–109)
CO2: 30 mEq/L — ABNORMAL HIGH (ref 22–29)
CREATININE: 0.8 mg/dL (ref 0.6–1.1)
EGFR: 71 mL/min/{1.73_m2} — ABNORMAL LOW (ref >90)
Glucose: 174 mg/dl — ABNORMAL HIGH (ref 70–140)
Potassium: 3.3 mEq/L — ABNORMAL LOW (ref 3.5–5.1)
Sodium: 140 mEq/L (ref 136–145)
TOTAL PROTEIN: 8.2 g/dL (ref 6.4–8.3)
Total Bilirubin: 0.55 mg/dL (ref 0.20–1.20)

## 2015-08-21 MED ORDER — DIPHENHYDRAMINE HCL 25 MG PO CAPS
25.0000 mg | ORAL_CAPSULE | Freq: Once | ORAL | Status: AC
  Administered 2015-08-21: 25 mg via ORAL

## 2015-08-21 MED ORDER — HEPARIN SOD (PORK) LOCK FLUSH 100 UNIT/ML IV SOLN
500.0000 [IU] | Freq: Once | INTRAVENOUS | Status: AC | PRN
  Administered 2015-08-21: 500 [IU]
  Filled 2015-08-21: qty 5

## 2015-08-21 MED ORDER — ACETAMINOPHEN 325 MG PO TABS
ORAL_TABLET | ORAL | Status: AC
  Filled 2015-08-21: qty 2

## 2015-08-21 MED ORDER — ZOLEDRONIC ACID 4 MG/100ML IV SOLN
4.0000 mg | Freq: Once | INTRAVENOUS | Status: AC
  Administered 2015-08-21: 4 mg via INTRAVENOUS
  Filled 2015-08-21: qty 100

## 2015-08-21 MED ORDER — SODIUM CHLORIDE 0.9 % IJ SOLN
10.0000 mL | INTRAMUSCULAR | Status: DC | PRN
  Administered 2015-08-21: 10 mL
  Filled 2015-08-21: qty 10

## 2015-08-21 MED ORDER — SODIUM CHLORIDE 0.9 % IV SOLN
Freq: Once | INTRAVENOUS | Status: AC
  Administered 2015-08-21: 15:00:00 via INTRAVENOUS

## 2015-08-21 MED ORDER — ACETAMINOPHEN 325 MG PO TABS
650.0000 mg | ORAL_TABLET | Freq: Once | ORAL | Status: AC
  Administered 2015-08-21: 650 mg via ORAL

## 2015-08-21 MED ORDER — SODIUM CHLORIDE 0.9 % IV SOLN
3.5000 mg/kg | Freq: Once | INTRAVENOUS | Status: AC
  Administered 2015-08-21: 360 mg via INTRAVENOUS
  Filled 2015-08-21: qty 8

## 2015-08-21 MED ORDER — DIPHENHYDRAMINE HCL 25 MG PO CAPS
ORAL_CAPSULE | ORAL | Status: AC
Start: 2015-08-21 — End: 2015-08-21
  Filled 2015-08-21: qty 1

## 2015-08-21 NOTE — Patient Instructions (Signed)
[facility] Discharge Instructions for Patients Receiving Chemotherapy  Today you received the following chemotherapy agents kadcyla/zometa  To help prevent nausea and vomiting after your treatment, we encourage you to take your nausea medication as directed   If you develop nausea and vomiting that is not controlled by your nausea medication, call the clinic.   BELOW ARE SYMPTOMS THAT SHOULD BE REPORTED IMMEDIATELY:  *FEVER GREATER THAN 100.5 F  *CHILLS WITH OR WITHOUT FEVER  NAUSEA AND VOMITING THAT IS NOT CONTROLLED WITH YOUR NAUSEA MEDICATION  *UNUSUAL SHORTNESS OF BREATH  *UNUSUAL BRUISING OR BLEEDING  TENDERNESS IN MOUTH AND THROAT WITH OR WITHOUT PRESENCE OF ULCERS  *URINARY PROBLEMS  *BOWEL PROBLEMS  UNUSUAL RASH Items with * indicate a potential emergency and should be followed up as soon as possible.  Feel free to call the clinic you have any questions or concerns. The clinic phone number is [phone].

## 2015-08-21 NOTE — Progress Notes (Signed)
Pt refused to wait 30 minutes post observation period following kadcyla.  Pt A&O, No complaints.

## 2015-08-23 ENCOUNTER — Telehealth: Payer: Self-pay | Admitting: Physician Assistant

## 2015-08-23 ENCOUNTER — Other Ambulatory Visit (HOSPITAL_COMMUNITY): Payer: Self-pay | Admitting: *Deleted

## 2015-08-23 MED ORDER — POTASSIUM CHLORIDE CRYS ER 20 MEQ PO TBCR: 20.0000 meq | EXTENDED_RELEASE_TABLET | Freq: Every day | ORAL | Status: DC

## 2015-08-23 MED ORDER — MAGNESIUM OXIDE 400 (241.3 MG) MG PO TABS
400.0000 mg | ORAL_TABLET | Freq: Every day | ORAL | Status: DC
Start: 2015-08-23 — End: 2016-11-20

## 2015-08-23 MED ORDER — FUROSEMIDE 20 MG PO TABS
20.0000 mg | ORAL_TABLET | ORAL | Status: DC
Start: 2015-08-23 — End: 2016-09-02

## 2015-08-23 MED FILL — KLOR-CON M20 TABLET: 20 | 30 days supply | Qty: 30 | Fill #0

## 2015-08-23 MED FILL — FUROSEMIDE 20 MG TABLET: 20 | 84 days supply | Qty: 24 | Fill #0

## 2015-08-23 NOTE — Telephone Encounter (Signed)
Patient lvm inquiring if patient received flu shot

## 2015-08-23 NOTE — Telephone Encounter (Signed)
LVM inquiring if patient received flu shot  °

## 2015-09-11 ENCOUNTER — Other Ambulatory Visit (HOSPITAL_BASED_OUTPATIENT_CLINIC_OR_DEPARTMENT_OTHER): Payer: Medicare Other

## 2015-09-11 ENCOUNTER — Encounter: Payer: Self-pay | Admitting: Nurse Practitioner

## 2015-09-11 ENCOUNTER — Ambulatory Visit (HOSPITAL_BASED_OUTPATIENT_CLINIC_OR_DEPARTMENT_OTHER): Payer: Medicare Other

## 2015-09-11 ENCOUNTER — Telehealth: Payer: Self-pay | Admitting: Oncology

## 2015-09-11 ENCOUNTER — Ambulatory Visit (HOSPITAL_BASED_OUTPATIENT_CLINIC_OR_DEPARTMENT_OTHER): Payer: Medicare Other | Admitting: Nurse Practitioner

## 2015-09-11 VITALS — BP 132/68 | HR 84 | Temp 97.7°F | Resp 18 | Wt 214.7 lb

## 2015-09-11 VITALS — BP 121/59 | HR 73 | Temp 98.1°F | Resp 18

## 2015-09-11 DIAGNOSIS — C50911 Malignant neoplasm of unspecified site of right female breast: Secondary | ICD-10-CM

## 2015-09-11 DIAGNOSIS — Z86711 Personal history of pulmonary embolism: Secondary | ICD-10-CM

## 2015-09-11 DIAGNOSIS — C7931 Secondary malignant neoplasm of brain: Secondary | ICD-10-CM

## 2015-09-11 DIAGNOSIS — Z79899 Other long term (current) drug therapy: Secondary | ICD-10-CM

## 2015-09-11 DIAGNOSIS — C50812 Malignant neoplasm of overlapping sites of left female breast: Secondary | ICD-10-CM

## 2015-09-11 DIAGNOSIS — J910 Malignant pleural effusion: Secondary | ICD-10-CM

## 2015-09-11 DIAGNOSIS — C50811 Malignant neoplasm of overlapping sites of right female breast: Secondary | ICD-10-CM

## 2015-09-11 DIAGNOSIS — C50912 Malignant neoplasm of unspecified site of left female breast: Secondary | ICD-10-CM

## 2015-09-11 DIAGNOSIS — Z5112 Encounter for antineoplastic immunotherapy: Secondary | ICD-10-CM | POA: Diagnosis present

## 2015-09-11 DIAGNOSIS — I5032 Chronic diastolic (congestive) heart failure: Secondary | ICD-10-CM

## 2015-09-11 DIAGNOSIS — Z170 Estrogen receptor positive status [ER+]: Secondary | ICD-10-CM | POA: Diagnosis not present

## 2015-09-11 DIAGNOSIS — C7951 Secondary malignant neoplasm of bone: Secondary | ICD-10-CM

## 2015-09-11 DIAGNOSIS — C50919 Malignant neoplasm of unspecified site of unspecified female breast: Secondary | ICD-10-CM

## 2015-09-11 DIAGNOSIS — Z5181 Encounter for therapeutic drug level monitoring: Secondary | ICD-10-CM

## 2015-09-11 DIAGNOSIS — C7800 Secondary malignant neoplasm of unspecified lung: Principal | ICD-10-CM

## 2015-09-11 LAB — CBC WITH DIFFERENTIAL/PLATELET
BASO%: 0.5 % (ref 0.0–2.0)
Basophils Absolute: 0 10*3/uL (ref 0.0–0.1)
EOS ABS: 0.1 10*3/uL (ref 0.0–0.5)
EOS%: 1.3 % (ref 0.0–7.0)
HCT: 38 % (ref 34.8–46.6)
HGB: 12.4 g/dL (ref 11.6–15.9)
LYMPH%: 30.5 % (ref 14.0–49.7)
MCH: 30.2 pg (ref 25.1–34.0)
MCHC: 32.5 g/dL (ref 31.5–36.0)
MCV: 92.7 fL (ref 79.5–101.0)
MONO#: 0.5 10*3/uL (ref 0.1–0.9)
MONO%: 9.8 % (ref 0.0–14.0)
NEUT#: 3 10*3/uL (ref 1.5–6.5)
NEUT%: 57.9 % (ref 38.4–76.8)
PLATELETS: 156 10*3/uL (ref 145–400)
RBC: 4.1 10*6/uL (ref 3.70–5.45)
RDW: 16.5 % — ABNORMAL HIGH (ref 11.2–14.5)
WBC: 5.1 10*3/uL (ref 3.9–10.3)
lymph#: 1.6 10*3/uL (ref 0.9–3.3)

## 2015-09-11 LAB — COMPREHENSIVE METABOLIC PANEL
ALT: 25 U/L (ref 0–55)
ANION GAP: 8 meq/L (ref 3–11)
AST: 36 U/L — ABNORMAL HIGH (ref 5–34)
Albumin: 3.3 g/dL — ABNORMAL LOW (ref 3.5–5.0)
Alkaline Phosphatase: 86 U/L (ref 40–150)
BILIRUBIN TOTAL: 0.5 mg/dL (ref 0.20–1.20)
BUN: 16.3 mg/dL (ref 7.0–26.0)
CHLORIDE: 105 meq/L (ref 98–109)
CO2: 30 meq/L — AB (ref 22–29)
Calcium: 9.5 mg/dL (ref 8.4–10.4)
Creatinine: 0.8 mg/dL (ref 0.6–1.1)
EGFR: 75 mL/min/{1.73_m2} — AB (ref >90)
GLUCOSE: 158 mg/dL — AB (ref 70–140)
POTASSIUM: 3.9 meq/L (ref 3.5–5.1)
SODIUM: 142 meq/L (ref 136–145)
Total Protein: 8.2 g/dL (ref 6.4–8.3)

## 2015-09-11 MED ORDER — ADO-TRASTUZUMAB EMTANSINE CHEMO INJECTION 160 MG
3.5000 mg/kg | Freq: Once | INTRAVENOUS | Status: AC
  Administered 2015-09-11: 360 mg via INTRAVENOUS
  Filled 2015-09-11: qty 13

## 2015-09-11 MED ORDER — ACETAMINOPHEN 325 MG PO TABS
650.0000 mg | ORAL_TABLET | Freq: Once | ORAL | Status: AC
  Administered 2015-09-11: 650 mg via ORAL

## 2015-09-11 MED ORDER — SODIUM CHLORIDE 0.9 % IV SOLN
Freq: Once | INTRAVENOUS | Status: AC
  Administered 2015-09-11: 15:00:00 via INTRAVENOUS

## 2015-09-11 MED ORDER — HEPARIN SOD (PORK) LOCK FLUSH 100 UNIT/ML IV SOLN
500.0000 [IU] | Freq: Once | INTRAVENOUS | Status: AC | PRN
  Administered 2015-09-11: 500 [IU]
  Filled 2015-09-11: qty 5

## 2015-09-11 MED ORDER — ACETAMINOPHEN 325 MG PO TABS
ORAL_TABLET | ORAL | Status: AC
  Filled 2015-09-11: qty 2

## 2015-09-11 MED ORDER — SODIUM CHLORIDE 0.9 % IJ SOLN
10.0000 mL | INTRAMUSCULAR | Status: DC | PRN
  Administered 2015-09-11: 10 mL
  Filled 2015-09-11: qty 10

## 2015-09-11 MED ORDER — DIPHENHYDRAMINE HCL 25 MG PO CAPS
25.0000 mg | ORAL_CAPSULE | Freq: Once | ORAL | Status: AC
  Administered 2015-09-11: 25 mg via ORAL

## 2015-09-11 MED ORDER — DIPHENHYDRAMINE HCL 25 MG PO CAPS
ORAL_CAPSULE | ORAL | Status: AC
  Filled 2015-09-11: qty 1

## 2015-09-11 NOTE — Progress Notes (Signed)
Pt tolerated Kadcyla infusion well. Pt refused to stay 30 minutes post infusion for observation. Pt educated to call clinic with any questions or concerns. Pt verbalized understanding. Pt and VS stable at time of discharge.

## 2015-09-11 NOTE — Telephone Encounter (Signed)
Gave and printed appt sched and avs for pt for April and May °

## 2015-09-11 NOTE — Progress Notes (Signed)
ID: [name]   DOB: July 02, 1945  MR#: [id number]  FEO#:[id number]  PCP: Leeanne Rio, PA-C GYN: SU: Coralie Keens OTHER MD: Crissie Reese, Linna Hoff Bensimhon  CHIEF COMPLAINT:  Bilateral Breast Cancers  CURRENT TREATMENT: Anastrozole, T-DM 1, zolendrnate  BREAST CANCER HISTORY: From the original intake note:  The patient noted a small amount of drainage from her left breast December of 2012. She brought it to her gynecologist's attention in January of 2013 and was set up for bilateral diagnostic mammography at the breast Center July 24, 2011. This was the patient's first ever mammogram. It showed a large irregular mass in the left retroareolar region extending to the nipple, with nipple retraction and skin thickening. This measured approximately 8.4 cm. It was associated with pleomorphic microcalcifications. By exam there was moderate distortion and retraction of the nipple with a large palpable ill-defined area in the retroareolar region. In the right right breast there was also a 2 cm hard mass palpated. Ultrasound of the right breast mass showed a complex cystic/solid area measuring 1.9 cm. Ultrasound of the right axilla was negative. Ultrasound of the left breast showed a large hypoechoic mass measuring at least 3.8 cm. The left axilla showed several adjacent abnormal appearing lymph nodes.  With this information biopsy of the right and left breast masses were obtained the same day, and showed ([id number])   (a) on the right, and invasive ductal carcinoma with papillary features, estrogen and progesterone receptor positive, HER-2 negative, with an MIB-1 of 10%.   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 The MIB-1 was 60% for the left-sided tumor.   With this information the patient was presented at the multidisciplinary breast cancer conference 08/06/2011. Subsequent  evaluation and treatments are as detailed below.  INTERVAL HISTORY: Linda [name] returns today for follow up of her metastatic breast cancer, accompanied by a friend. Today she is due for cycle 13 of T-DM 1, given every 21 days. She is also on anastrozole daily. She tolerates both drug well with no side effects that she is aware of. She is next due for zometa in late May. The interval history is completely unremarkable.   REVIEW OF SYSTEMS: Linda [name] is doing well today. 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 Her endurance is fair. She denies fevers, chills, nausea, or vomiting. She moves her bowels well with stool softeners. She is eating well. She is no longer coughing. She denies shortness of breath, chest pain, or palpitations. She denies headaches, dizziness, or vision changes. She has no swelling. A detailed review of systems is otherwise stable.  PAST MEDICAL HISTORY: Past Medical History  Diagnosis Date  . Cancer ([place]) 08-13-11    07-31-11-Dx. Bilarteral Breast cancer-left greater than rt.  . Hematuria, undiagnosed cause 08-13-11    Being evaluated by [facility] 08-14-11  . Hypertension   . Bronchitis     hx  . GERD (gastroesophageal reflux disease)     doing well  . Breast cancer ([place]) 07/30/11 dx    Right  invasive ductal ca 7 0'clock,& left breast=invasive ductal ca  and dcis, left axilla nlymph node, metastatic ca  . Seroma 02/04/12    right breast  200cc removed  erythema on right side  . Seasonal allergies   . History of radiation therapy 02/20/12-04/15/12    left breast,total 60.4 Gy  .  Wears partial dentures     wears upper and lower partial  . Allergy   . Blood transfusion without reported diagnosis   . 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   . Cancer of central portion of left female breast ([place]) 08/01/2011  . S/P radiation therapy 10/09/14-10/27/14    whole brain 37.5Gy/81f  . [place]  [place] 12/13/2014  . Acute on chronic diastolic CHF (congestive heart failure) [place]     PAST SURGICAL HISTORY: Past Surgical History  Procedure Laterality Date  . Child birth  08-13-11    x3 -NVD  . Portacath placement  08/15/2011    Procedure: INSERTION PORT-A-CATH;  Surgeon: DHarl Bowie MD;  Location: [facility];  Service: General;  Laterality: N/A;  . Mastectomy w/ sentinel node biopsy  01/21/2012    Procedure: MASTECTOMY WITH SENTINEL LYMPH NODE BIOPSY;  Surgeon: DHarl Bowie MD;  [facility]  Service: General;  Laterality: Bilateral;  Left modified radical mastectomy, Rt mastectomy with Sentinel lymphnode biospy  . Breast surgery    . Port-a-cath removal Right 12/22/2012    Procedure: REMOVAL PORT-A-CATH;  Surgeon: DHarl Bowie MD;  [facility]  Service: General;  Laterality: Right;  . Subxyphoid pericardial window N/A 12/14/2014    Procedure: SUBXYPHOID PERICARDIAL WINDOW;  Surgeon: CRexene Alberts MD;  [facility]  Service: Thoracic;  Laterality: N/A;  . Tee without cardioversion N/A 12/14/2014    Procedure: TRANSESOPHAGEAL ECHOCARDIOGRAM (TEE);  Surgeon: CRexene Alberts MD;  [facility]  Service: Thoracic;  Laterality: N/A;  . Chest tube insertion Left 12/14/2014    Procedure: CHEST TUBE INSERTION;  Surgeon: CRexene Alberts MD;  [facility];  Service: Thoracic;  Laterality: Left;    FAMILY HISTORY Family History  Problem Relation Age of Onset  . Heart disease Mother   . Cancer Mother 466   breast, , 762deceased  . Heart attack Father   . Cancer Sister 613   breast  . Colon cancer Neg Hx   The patient's father died from a heart attack at the age of 822 The patient's mother died from apparently heart problems at the age of 841 The patient had no brothers. She has 3 sisters. One of her sisters was diagnosed with breast cancer in her mid 698s The patient does not know if his sister was  ever genetically tested. The patient's mother had a mastectomy at the age of 394 presumably for breast cancer. There is no other history of breast or in cancer in the family to her knowledge.   GYNECOLOGIC HISTORY:  She does not recall when she had menarche. She had her first child at age 89 She is GX P3. She underwent menopause in her mid 43s She never took hormone replacement.   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 [place] has a cGames developer[place] Son CGerald Stabslives [place] works as a fAirline pilot His wife is a nMarine scientist Son DShanon Browlives at [employer]and is a dAdvertising account executivein addition to having the mTenne[employer] Inc The patient attends a local BLehman Brothershere   ADVANCED DIRECTIVES: Not in place. At the clinic visit 09/11/2015 the patient was given the appropriate forms to  complete and notarize at her discretion. She tells me she ihas named her daughter-in-law Jacqlyn Larsen as her healthcare power of attorney  HEALTH MAINTENANCE: Social History  Substance Use Topics  . Smoking status: Never Smoker   . Smokeless tobacco: Never Used  . Alcohol Use: 0.0 oz/week    0 Standard drinks or equivalent per week     Comment: rare- occ.     Colonoscopy: Never  PAP: Feb 2013  Bone density: November 2013, normal  Lipid panel:   Allergies  Allergen Reactions  . Ace Inhibitors Cough    Current Outpatient Prescriptions  Medication Sig Dispense Refill  . anastrozole (ARIMIDEX) 1 MG tablet Take 1 tablet (1 mg total) by mouth daily. 30 tablet 3  . b complex vitamins tablet Take 1 tablet by mouth daily.    . cholecalciferol (VITAMIN D) 1000 UNITS tablet Take 1 tablet (1,000 Units total) by mouth daily. 100 tablet 12  . docusate sodium (COLACE) 100 MG capsule Take 1 capsule (100 mg total) by mouth 2 (two) times daily. 10  capsule 0  . furosemide (LASIX) 20 MG tablet Take 1 tablet (20 mg total) by mouth 2 (two) times a week. 30 tablet 12  . magnesium oxide (MAG-OX) 400 (241.3 Mg) MG tablet Take 1 tablet (400 mg total) by mouth daily. 30 tablet 12  . Multiple Vitamins-Iron (MULTIVITAMINS WITH IRON) TABS Take 1 tablet by mouth daily.    . potassium chloride SA (K-DUR,KLOR-CON) 20 MEQ tablet Take 1 tablet (20 mEq total) by mouth daily. 30 tablet 12  . HYDROcodone-homatropine (HYCODAN) 5-1.5 MG/5ML syrup Take 5 mLs by mouth every 6 (six) hours as needed for cough. (Patient not taking: Reported on 09/11/2015) 240 mL 0   No current facility-administered medications for this visit.   Facility-Administered Medications Ordered in Other Visits  Medication Dose Route Frequency Provider Last Rate Last Dose  . ado-trastuzumab emtansine (KADCYLA) 360 mg in sodium chloride 0.9 % 250 mL chemo infusion  3.5 mg/kg (Treatment Plan Actual) Intravenous Once Chauncey Cruel, MD      . heparin lock flush 100 unit/mL  500 Units Intracatheter Once PRN Chauncey Cruel, MD      . sodium chloride 0.9 % injection 10 mL  10 mL Intracatheter PRN Chauncey Cruel, MD   10 mL at 06/19/15 1554  . sodium chloride 0.9 % injection 10 mL  10 mL Intracatheter PRN Chauncey Cruel, MD        OBJECTIVE: [name] in no acute distres0 Filed Vitals:   09/11/15 1352  BP: 132/68  Pulse: 84  Temp: 97.7 F (36.5 C)  Resp: 18     Body mass index is 31.69 kg/(m^2).    ECOG FS: 0 Filed Weights   09/11/15 1352  Weight: 214 lb 11.2 oz (97.387 kg)    Skin: warm, dry  HEENT: sclerae anicteric, conjunctivae pink, oropharynx clear. No thrush or mucositis.  Lymph Nodes: No cervical or supraclavicular lymphadenopathy  Lungs: clear to auscultation bilaterally, no rales, wheezes, or rhonci  Heart: regular rate and rhythm  Abdomen: round, soft, non tender, positive bowel sounds  Musculoskeletal: No focal spinal tenderness, no peripheral  edema  Neuro: non focal, well oriented, positive affect  Breasts: deferred   LAB RESULTS: CBC Latest Ref Rng 09/11/2015 08/21/2015 08/01/2015  WBC 3.9 - 10.3 10e3/uL 5.1 5.1 5.4  Hemoglobin 11.6 - 15.9 g/dL 12.4 11.8 11.9  Hematocrit 34.8 - 46.6 % 38.0 36.2 36.2  Platelets 145 - 400  10e3/uL 156 138(L) 168   CMP Latest Ref Rng 09/11/2015 08/21/2015 08/01/2015  Glucose 70 - 140 mg/dl 158(H) 174(H) 125  BUN 7.0 - 26.0 mg/dL 16.3 14.7 14.6  Creatinine 0.6 - 1.1 mg/dL 0.8 0.8 0.8  Sodium 136 - 145 mEq/L 142 140 143  Potassium 3.5 - 5.1 mEq/L 3.9 3.3(L) 4.1  CO2 22 - 29 mEq/L 30(H) 30(H) 31(H)  Calcium 8.4 - 10.4 mg/dL 9.5 9.8 9.4  Total Protein 6.4 - 8.3 g/dL 8.2 8.2 8.0  Total Bilirubin 0.20 - 1.20 mg/dL 0.50 0.55 0.36  Alkaline Phos 40 - 150 U/L 86 89 97  AST 5 - 34 U/L 36(H) 34 31  ALT 0 - 55 U/L 25 22 19     STUDIES: .Dg Chest 2 View  08/21/2015  CLINICAL DATA:  Evaluate for recurrent pleural effusion. EXAM: CHEST  2 VIEW COMPARISON:  June 15, 2015 FINDINGS: The heart size and mediastinal contours are within normal limits. Right central venous line is unchanged. There is no focal infiltrate, pulmonary edema, or pleural effusion. Surgical clips are identified in the left axilla. The visualized skeletal structures are stable. IMPRESSION: No active cardiopulmonary disease.  There is no pleural effusion. Electronically Signed   By: Abelardo Diesel M.D.   On: 08/21/2015 16:08    ASSESSMENT: 70 y.o.  [place] woman   (1)  status post bilateral breast biopsies 07/24/2011, showing,      (a) on the right, a clinical T2 N0 papillary/ductal breast cancer, estrogen and progesterone receptor positive, HER-2 negative, with an MIB-1 of 10%;     (b) on the left, a clinical T3 N1, stage IIIA invasive ductal carcinoma, grade 3, triple positive, with an MIB-1 of 60%.  (2)  Status post 4 dose dense cycles of doxorubicin/ cyclophosphamide followed by 4 dose dense cycles of paclitaxel and trastuzumab  completed 12/09/2011  (3) the trastuzumab was continued for a total of one year (to 11/01/2012). Final echo on 11/04/2012 showing a well preserved ejection fraction of 55-60%.  (4) s/p bilateral mastectomies 01/21/2012 showing   (a) on the Right, an 8 mm invasive papillary carcinoma, grade 1, ypT1b ypN0   (b) on the Left, multiple microscopic foci of residual  Invasive ductal carcinoma with evidence of dermal lymphatic involvement, pyT1a/T4 pyN0  (5)  Postmastectomy radiation, completed 04/15/2012  (6) Started anastrazole 04/16/2012; normal dexa scan 05/19/2014 at the [facility] 09/11/2014: brain, bones, left effusion (7) CT angiogram 09/11/2014 shows new left pericardial effusion and new mediastinal and hilar lymphadenopathy; there were no suspicious upper abdominal findings; Cytology from the left effusion 09/11/2014 ([id number]) shows malignant cells which are HER-2 positive  (8) Whole body bone scan on 09/25/14 showed metastatic foci throughout axial and appendicular skeleton. Areas of most potential concern include disease in the thoracic spine, disease in the right acetabulum, right superior pubic ramus and possibly proximal right femur, disease in the right humeral shaft and in both femoral shafts.  (8) Started trastuzumab/pertuzumab 09/26/2014, discontinued after 11/29/2014 dose, w progression  (a) T-DM1 started 01/02/2015  (b) echocardiogram 07/02/2015 showed an ejection fraction between 60 and 65%  (9) Brain MRI on 09/25/14 was positive for numerous small enhancing brain mets and calvarium/bone mets   (a) whole brain radiation on 10/09/14--10/27/2014  (b) brain MRI 01/07/2015 shows a complete clinical response  (c) REPEAT BRAIN mri 03/18/2015 SHOWS NO NEW LESIONS  (d) repeat brain MRI with and without contrast 06/29/2015 continues to show no recurrent lesions in the brain  (  10) started zolendronate 11/29/2014, to be repeated every 12 weeks, most recent dose  05/28/2015  11) left pleural effusion re-tapped 12/08/2014 (a) left chest tube placement 12/14/2014, removedd 12/21/2014  (12) large pericardial effusion per echo 12/13/2014  (a) pericardial window placement 12/14/2014; fluid is hemorrhagic; path negative  (13) Right-sided pulmonary emboli noted on CT scan 12/13/2014  (a) no pulmonary emboli demonstrated on non-dedicated chest CT 04/13/2015  PLAN: Linda [name] and I reviewed the results of her chest xray. This was completely normal. The labs were reviewed in detail and were stable. She will proceed with her next dose of T-DM1 as planned today. She will continue on anastrozole daily.  She is due for a repeat echocardiogram in April, so I have placed the appropriate orders.   Linda [name] will continue treatment every 3 weeks. Her next follow up visit will be in 6 weeks. She understands and agrees with this plan. She knows the goal of treatment in her case is control. She has been encouraged to call with any issues that might arise before her next visit here.   Laurie Panda, NP

## 2015-09-11 NOTE — Patient Instructions (Signed)
Cancer Center Discharge Instructions for Patients Receiving Chemotherapy  Today you received the following chemotherapy agents: Kadcyla  To help prevent nausea and vomiting after your treatment, we encourage you to take your nausea medication as directed.    If you develop nausea and vomiting that is not controlled by your nausea medication, call the clinic.   BELOW ARE SYMPTOMS THAT SHOULD BE REPORTED IMMEDIATELY:  *FEVER GREATER THAN 100.5 F  *CHILLS WITH OR WITHOUT FEVER  NAUSEA AND VOMITING THAT IS NOT CONTROLLED WITH YOUR NAUSEA MEDICATION  *UNUSUAL SHORTNESS OF BREATH  *UNUSUAL BRUISING OR BLEEDING  TENDERNESS IN MOUTH AND THROAT WITH OR WITHOUT PRESENCE OF ULCERS  *URINARY PROBLEMS  *BOWEL PROBLEMS  UNUSUAL RASH Items with * indicate a potential emergency and should be followed up as soon as possible.  Feel free to call the clinic you have any questions or concerns. The clinic phone number is [phone].  Please show the CHEMO ALERT CARD at check-in to the Emergency Department and triage nurse.   

## 2015-09-12 ENCOUNTER — Telehealth: Payer: Self-pay | Admitting: Nurse Practitioner

## 2015-09-12 NOTE — Progress Notes (Signed)
last order sent 08-23-2015.

## 2015-09-12 NOTE — Telephone Encounter (Signed)
per pof to sch pt appt-sent Vaughan Basta email to pre-cert-will sch and call pt after reply

## 2015-09-12 NOTE — Progress Notes (Signed)
On 08-14-2015 ordered with four total refills

## 2015-09-14 MED FILL — ANASTROZOLE 1 MG TABLET: 1 | 30 days supply | Qty: 30 | Fill #1

## 2015-09-19 MED FILL — KLOR-CON M20 TABLET: 20 | 30 days supply | Qty: 30 | Fill #1

## 2015-10-02 ENCOUNTER — Telehealth: Payer: Self-pay | Admitting: *Deleted

## 2015-10-02 ENCOUNTER — Other Ambulatory Visit: Payer: Self-pay

## 2015-10-02 ENCOUNTER — Ambulatory Visit (HOSPITAL_COMMUNITY): Admission: RE | Admit: 2015-10-02 | Payer: Medicare Other | Source: Ambulatory Visit

## 2015-10-02 ENCOUNTER — Other Ambulatory Visit: Payer: Self-pay | Admitting: *Deleted

## 2015-10-02 ENCOUNTER — Ambulatory Visit: Payer: Self-pay

## 2015-10-02 NOTE — Telephone Encounter (Signed)
"  I need to speak with the LPN for [place] NP."  Call transferred to ext [phone].

## 2015-10-02 NOTE — Telephone Encounter (Signed)
Called pt. No answer but left message to remind her of the new scheduled echo appt for 4/19 at 11:00 @ [facility]. Pt knows to call this nurse if she has any questions.

## 2015-10-03 ENCOUNTER — Telehealth: Payer: Self-pay | Admitting: Oncology

## 2015-10-03 ENCOUNTER — Ambulatory Visit (HOSPITAL_COMMUNITY)
Admission: RE | Admit: 2015-10-03 | Discharge: 2015-10-03 | Disposition: A | Discharge status: Home / Self Care | Payer: Medicare Other | Source: Ambulatory Visit | Attending: Nurse Practitioner | Admitting: Nurse Practitioner

## 2015-10-03 DIAGNOSIS — I5032 Chronic diastolic (congestive) heart failure: Secondary | ICD-10-CM | POA: Insufficient documentation

## 2015-10-03 DIAGNOSIS — Z79899 Other long term (current) drug therapy: Secondary | ICD-10-CM | POA: Insufficient documentation

## 2015-10-03 DIAGNOSIS — Z5181 Encounter for therapeutic drug level monitoring: Secondary | ICD-10-CM | POA: Diagnosis not present

## 2015-10-03 NOTE — Telephone Encounter (Signed)
Return call received from Linda [name] inquiring about a missed call.  Informed her she is scheduled for 10-05-2015 beginning at 10:00 am.  No further questions.

## 2015-10-03 NOTE — Progress Notes (Signed)
Echocardiogram 2D Echocardiogram limited has been performed.  Tresa Res 10/03/2015, 11:48 AM

## 2015-10-03 NOTE — Telephone Encounter (Signed)
lvm for pt to confirm 4.21 appt...Marland KitchenMarland Kitchen

## 2015-10-05 ENCOUNTER — Ambulatory Visit (HOSPITAL_BASED_OUTPATIENT_CLINIC_OR_DEPARTMENT_OTHER): Payer: Medicare Other

## 2015-10-05 ENCOUNTER — Other Ambulatory Visit (HOSPITAL_BASED_OUTPATIENT_CLINIC_OR_DEPARTMENT_OTHER): Payer: Medicare Other

## 2015-10-05 VITALS — BP 135/66 | HR 66 | Temp 98.2°F | Resp 18

## 2015-10-05 DIAGNOSIS — C50912 Malignant neoplasm of unspecified site of left female breast: Secondary | ICD-10-CM

## 2015-10-05 DIAGNOSIS — C7951 Secondary malignant neoplasm of bone: Secondary | ICD-10-CM | POA: Diagnosis not present

## 2015-10-05 DIAGNOSIS — J910 Malignant pleural effusion: Secondary | ICD-10-CM | POA: Diagnosis not present

## 2015-10-05 DIAGNOSIS — C50911 Malignant neoplasm of unspecified site of right female breast: Secondary | ICD-10-CM

## 2015-10-05 DIAGNOSIS — Z5112 Encounter for antineoplastic immunotherapy: Secondary | ICD-10-CM

## 2015-10-05 DIAGNOSIS — C50811 Malignant neoplasm of overlapping sites of right female breast: Secondary | ICD-10-CM

## 2015-10-05 DIAGNOSIS — C50812 Malignant neoplasm of overlapping sites of left female breast: Secondary | ICD-10-CM

## 2015-10-05 LAB — CBC WITH DIFFERENTIAL/PLATELET
BASO%: 0.9 % (ref 0.0–2.0)
Basophils Absolute: 0 10*3/uL (ref 0.0–0.1)
EOS%: 1.8 % (ref 0.0–7.0)
Eosinophils Absolute: 0.1 10*3/uL (ref 0.0–0.5)
HCT: 37.2 % (ref 34.8–46.6)
HGB: 12.1 g/dL (ref 11.6–15.9)
LYMPH%: 30.6 % (ref 14.0–49.7)
MCH: 30.2 pg (ref 25.1–34.0)
MCHC: 32.6 g/dL (ref 31.5–36.0)
MCV: 92.6 fL (ref 79.5–101.0)
MONO#: 0.4 10*3/uL (ref 0.1–0.9)
MONO%: 9.8 % (ref 0.0–14.0)
NEUT%: 56.9 % (ref 38.4–76.8)
NEUTROS ABS: 2.3 10*3/uL (ref 1.5–6.5)
Platelets: 128 10*3/uL — ABNORMAL LOW (ref 145–400)
RBC: 4.02 10*6/uL (ref 3.70–5.45)
RDW: 16.3 % — ABNORMAL HIGH (ref 11.2–14.5)
WBC: 4 10*3/uL (ref 3.9–10.3)
lymph#: 1.2 10*3/uL (ref 0.9–3.3)

## 2015-10-05 LAB — COMPREHENSIVE METABOLIC PANEL
ALK PHOS: 87 U/L (ref 40–150)
ALT: 23 U/L (ref 0–55)
ANION GAP: 8 meq/L (ref 3–11)
AST: 36 U/L — AB (ref 5–34)
Albumin: 3.1 g/dL — ABNORMAL LOW (ref 3.5–5.0)
BUN: 11.6 mg/dL (ref 7.0–26.0)
CALCIUM: 9.3 mg/dL (ref 8.4–10.4)
CHLORIDE: 107 meq/L (ref 98–109)
CO2: 25 mEq/L (ref 22–29)
Creatinine: 0.7 mg/dL (ref 0.6–1.1)
EGFR: 87 mL/min/{1.73_m2} — AB (ref >90)
Glucose: 121 mg/dl (ref 70–140)
POTASSIUM: 3.6 meq/L (ref 3.5–5.1)
Sodium: 140 mEq/L (ref 136–145)
Total Bilirubin: 0.64 mg/dL (ref 0.20–1.20)
Total Protein: 7.6 g/dL (ref 6.4–8.3)

## 2015-10-05 MED ORDER — ACETAMINOPHEN 325 MG PO TABS
ORAL_TABLET | ORAL | Status: AC
  Filled 2015-10-05: qty 2

## 2015-10-05 MED ORDER — ACETAMINOPHEN 325 MG PO TABS
650.0000 mg | ORAL_TABLET | Freq: Once | ORAL | Status: AC
  Administered 2015-10-05: 650 mg via ORAL

## 2015-10-05 MED ORDER — SODIUM CHLORIDE 0.9 % IJ SOLN
10.0000 mL | INTRAMUSCULAR | Status: DC | PRN
  Administered 2015-10-05: 10 mL
  Filled 2015-10-05: qty 10

## 2015-10-05 MED ORDER — HEPARIN SOD (PORK) LOCK FLUSH 100 UNIT/ML IV SOLN
500.0000 [IU] | Freq: Once | INTRAVENOUS | Status: AC | PRN
  Administered 2015-10-05: 500 [IU]
  Filled 2015-10-05: qty 5

## 2015-10-05 MED ORDER — SODIUM CHLORIDE 0.9 % IV SOLN
Freq: Once | INTRAVENOUS | Status: AC
  Administered 2015-10-05: 10:00:00 via INTRAVENOUS

## 2015-10-05 MED ORDER — DIPHENHYDRAMINE HCL 25 MG PO CAPS
ORAL_CAPSULE | ORAL | Status: AC
  Filled 2015-10-05: qty 1

## 2015-10-05 MED ORDER — DIPHENHYDRAMINE HCL 25 MG PO CAPS
25.0000 mg | ORAL_CAPSULE | Freq: Once | ORAL | Status: AC
  Administered 2015-10-05: 25 mg via ORAL

## 2015-10-05 MED ORDER — ADO-TRASTUZUMAB EMTANSINE CHEMO INJECTION 160 MG
3.5000 mg/kg | Freq: Once | INTRAVENOUS | Status: AC
  Administered 2015-10-05: 360 mg via INTRAVENOUS
  Filled 2015-10-05: qty 10

## 2015-10-05 NOTE — Patient Instructions (Signed)
Cancer Center Discharge Instructions for Patients Receiving Chemotherapy  Today you received the following chemotherapy agents: Kadcyla  To help prevent nausea and vomiting after your treatment, we encourage you to take your nausea medication as directed.    If you develop nausea and vomiting that is not controlled by your nausea medication, call the clinic.   BELOW ARE SYMPTOMS THAT SHOULD BE REPORTED IMMEDIATELY:  *FEVER GREATER THAN 100.5 F  *CHILLS WITH OR WITHOUT FEVER  NAUSEA AND VOMITING THAT IS NOT CONTROLLED WITH YOUR NAUSEA MEDICATION  *UNUSUAL SHORTNESS OF BREATH  *UNUSUAL BRUISING OR BLEEDING  TENDERNESS IN MOUTH AND THROAT WITH OR WITHOUT PRESENCE OF ULCERS  *URINARY PROBLEMS  *BOWEL PROBLEMS  UNUSUAL RASH Items with * indicate a potential emergency and should be followed up as soon as possible.  Feel free to call the clinic you have any questions or concerns. The clinic phone number is [phone].  Please show the CHEMO ALERT CARD at check-in to the Emergency Department and triage nurse.   

## 2015-10-08 ENCOUNTER — Other Ambulatory Visit: Payer: Self-pay | Admitting: Radiation Therapy

## 2015-10-08 ENCOUNTER — Telehealth: Payer: Self-pay | Admitting: Radiation Therapy

## 2015-10-08 DIAGNOSIS — C7931 Secondary malignant neoplasm of brain: Secondary | ICD-10-CM

## 2015-10-08 NOTE — Telephone Encounter (Signed)
I called to remind Linda [name] about her upcoming MRI and follow-up w/ Dr. Tammi Klippel. Left a VM with the dates and requested a return call.    Mont Dutton R.T.(R)(T) Special Procedures Navigator

## 2015-10-12 ENCOUNTER — Ambulatory Visit (HOSPITAL_COMMUNITY)
Admission: RE | Admit: 2015-10-12 | Discharge: 2015-10-12 | Disposition: A | Discharge status: Home / Self Care | Payer: Medicare Other | Source: Ambulatory Visit | Attending: Radiation Oncology | Admitting: Radiation Oncology

## 2015-10-12 ENCOUNTER — Encounter: Payer: Self-pay | Admitting: Genetic Counselor

## 2015-10-12 DIAGNOSIS — C7951 Secondary malignant neoplasm of bone: Secondary | ICD-10-CM | POA: Insufficient documentation

## 2015-10-12 DIAGNOSIS — C7931 Secondary malignant neoplasm of brain: Secondary | ICD-10-CM | POA: Diagnosis present

## 2015-10-12 DIAGNOSIS — Z1379 Encounter for other screening for genetic and chromosomal anomalies: Secondary | ICD-10-CM | POA: Insufficient documentation

## 2015-10-12 MED ORDER — GADOBENATE DIMEGLUMINE 529 MG/ML IV SOLN
20.0000 mL | Freq: Once | INTRAVENOUS | Status: AC | PRN
  Administered 2015-10-12: 20 mL via INTRAVENOUS

## 2015-10-15 ENCOUNTER — Encounter: Payer: Self-pay | Admitting: Radiation Oncology

## 2015-10-15 ENCOUNTER — Ambulatory Visit
Admission: RE | Admit: 2015-10-15 | Discharge: 2015-10-15 | Disposition: A | Discharge status: Home / Self Care | Payer: Medicare Other | Source: Ambulatory Visit | Attending: Radiation Oncology | Admitting: Radiation Oncology

## 2015-10-15 VITALS — BP 150/78 | HR 77 | Temp 98.3°F | Resp 16 | Ht 69.0 in | Wt 216.1 lb

## 2015-10-15 DIAGNOSIS — C50811 Malignant neoplasm of overlapping sites of right female breast: Secondary | ICD-10-CM

## 2015-10-15 DIAGNOSIS — C50812 Malignant neoplasm of overlapping sites of left female breast: Principal | ICD-10-CM

## 2015-10-15 DIAGNOSIS — C7931 Secondary malignant neoplasm of brain: Secondary | ICD-10-CM

## 2015-10-15 NOTE — Progress Notes (Signed)
Linda [name] here for follow up.  She denies having pain, headache or vision changes.  She has noticed that her balance is off which has been going on for awhile.  She reports feeling anxious about her MRI results. She is not taking decadron.  She reports having a good energy level and is working 3 days a week.  BP 150/78 mmHg  Pulse 77  Temp(Src) 98.3 F (36.8 C) (Oral)  Resp 16  Ht 5\' 9"  (1.753 m)  Wt 216 lb 1.6 oz (98.022 kg)  BMI 31.90 kg/m2  SpO2 98%   Wt Readings from Last 3 Encounters:  10/15/15 216 lb 1.6 oz (98.022 kg)  09/11/15 214 lb 11.2 oz (97.387 kg)  08/01/15 215 lb 9.6 oz (97.796 kg)

## 2015-10-15 NOTE — Progress Notes (Signed)
Radiation Oncology         [phone] ________________________________  Name: Linda [name] MRN: [id number]  Date: 10/15/2015  DOB: 1945-09-03  Follow-Up Visit Note  CC: Leeanne Rio, PA-C  Magrinat, Virgie Dad, MD  Diagnosis:   Metastatic bilateral breast cancer    ICD-9-CM ICD-10-CM   1. Bilateral malignant neoplasm of overlapping sites of breast in female ([facility]) 174.8 C50.811     C50.812   2. Brain metastases ([facility]) 198.3 C79.31     Interval Since Last Radiation:  12  months   10/09/14-10/27/14: Whole brain radiation to 37.5 grays in 15 fractions  Narrative:  The patient returns today for routine follow-up. Since her last visit she has undergone repeat MRI scan of the brain with an 11 mm area of the left frontal skull that is unchanged. No parenchymal lesions are identified.                        On review of systems, the patient reports that she is doing well overall. She denies any chest pain, shortness of breath, cough, fevers, chills, night sweats, unintended weight changes. She denies any bowel or bladder disturbances, and denies abdominal pain, nausea or vomiting. She does state that she is been experiencing some hearing loss left greater than right, and feels that she's underwater. She is not taking any steroids at this time, she states that she is not experiencing any headaches, blurred vision or double vision. She denies any changes in her balance but does state that she does have some trouble with this but this is unchanged. She denies any new musculoskeletal or joint aches or pains. A complete review of systems is obtained and is otherwise negative.   Past Medical History:  Past Medical History  Diagnosis Date  . Cancer ([place]) 08-13-11    07-31-11-Dx. Bilarteral Breast cancer-left greater than rt.  . Hematuria, undiagnosed cause 08-13-11    Being evaluated by [facility] 08-14-11  . Hypertension   . Bronchitis     hx  . GERD (gastroesophageal reflux disease)      doing well  . Breast cancer ([place]) 07/30/11 dx    Right  invasive ductal ca 7 0'clock,& left breast=invasive ductal ca  and dcis, left axilla nlymph node, metastatic ca  . Seroma 02/04/12    right breast  200cc removed  erythema on right side  . Seasonal allergies   . History of radiation therapy 02/20/12-04/15/12    left breast,total 60.4 Gy  . Wears partial dentures     wears upper and lower partial  . Allergy   . Blood transfusion without reported diagnosis   . 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   . Cancer of central portion of left female breast ([place]) 08/01/2011  . S/P radiation therapy 10/09/14-10/27/14    whole brain 37.5Gy/45f  . Acute pulmonary embolism ([place] 12/13/2014  . Acute on chronic diastolic CHF (congestive heart failure) ([place]     Past Surgical History: Past Surgical History  Procedure Laterality Date  . Child birth  08-13-11    x3 -NVD  . Portacath placement  08/15/2011    Procedure: INSERTION PORT-A-CATH;  Surgeon: DHarl Bowie MD;  Location: [facility];  Service: General;  Laterality: N/A;  . Mastectomy w/ sentinel node biopsy  01/21/2012    Procedure: MASTECTOMY WITH SENTINEL LYMPH NODE BIOPSY;  Surgeon: DHarl Bowie MD;  [facility]: [facility]  Service: General;  Laterality: Bilateral;  Left modified radical mastectomy, Rt mastectomy with Sentinel lymphnode biospy  . Breast surgery    . Port-a-cath removal Right 12/22/2012    Procedure: REMOVAL PORT-A-CATH;  Surgeon: Harl Bowie, MD;  Location: [facility];  Service: General;  Laterality: Right;  . Subxyphoid pericardial window N/A 12/14/2014    Procedure: SUBXYPHOID PERICARDIAL WINDOW;  Surgeon: Rexene Alberts, MD;  Location: [facility];  Service: Thoracic;  Laterality: N/A;  . Tee without cardioversion N/A 12/14/2014    Procedure: TRANSESOPHAGEAL ECHOCARDIOGRAM (TEE);  Surgeon: Rexene Alberts, MD;  Location: [facility];  Service:  Thoracic;  Laterality: N/A;  . Chest tube insertion Left 12/14/2014    Procedure: CHEST TUBE INSERTION;  Surgeon: Rexene Alberts, MD;  Location[facility];  Service: Thoracic;  Laterality: Left;    Social History:  Social History   Social History  . Marital Status: Single    Spouse Name: N/A  . Number of Children: 3  . Years of Education: N/A   Occupational History  . Retired    Social History Main Topics  . Smoking status: Never Smoker   . Smokeless tobacco: Never Used  . Alcohol Use: 0.0 oz/week    0 Standard drinks or equivalent per week     Comment: rare- occ.  . Drug Use: No  . Sexual Activity: No     Comment: gxp3, 1st child age 58, menopause mid 45's,no hrt   Other Topics Concern  . Not on file   Social History Narrative   Lives with son.  Single.  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 She moved to [place] about 2 years ago but has a Games developer in [place]. Son Gerald Stabs lives in [place] and works as a Airline pilot. His wife is a Marine scientist. Son Shanon Brow lives at [place] and is a Advertising account executive in addition to having the [employer]. The patient attends a local [employer] here.    Family History: Family History  Problem Relation Age of Onset  . Heart disease Mother   . Cancer Mother 45    breast, , 41 deceased  . Heart attack Father   . Cancer Sister 48    breast  . Colon cancer Neg Hx     ALLERGIES:  is allergic to ace inhibitors.  Meds: Current Outpatient Prescriptions  Medication Sig Dispense Refill  . anastrozole (ARIMIDEX) 1 MG tablet Take 1 tablet (1 mg total) by mouth daily. 30 tablet 3  . b complex vitamins tablet Take 1 tablet by mouth daily.    . cholecalciferol (VITAMIN D) 1000 UNITS tablet Take 1 tablet (1,000 Units total) by mouth daily. 100 tablet 12  . docusate sodium (COLACE) 100 MG capsule Take 1 capsule (100 mg total) by  mouth 2 (two) times daily. 10 capsule 0  . furosemide (LASIX) 20 MG tablet Take 1 tablet (20 mg total) by mouth 2 (two) times a week. 30 tablet 12  . magnesium oxide (MAG-OX) 400 (241.3 Mg) MG tablet Take 1 tablet (400 mg total) by mouth daily. 30 tablet 12  . Multiple Vitamins-Iron (MULTIVITAMINS WITH IRON) TABS Take 1 tablet by mouth daily.    . potassium chloride SA (K-DUR,KLOR-CON) 20 MEQ tablet Take 1 tablet (20 mEq total) by mouth daily. 30 tablet 12  . HYDROcodone-homatropine (HYCODAN) 5-1.5 MG/5ML syrup Take 5  mLs by mouth every 6 (six) hours as needed for cough. (Patient not taking: Reported on 09/11/2015) 240 mL 0   No current facility-administered medications for this encounter.   Facility-Administered Medications Ordered in Other Encounters  Medication Dose Route Frequency Provider Last Rate Last Dose  . sodium chloride 0.9 % injection 10 mL  10 mL Intracatheter PRN Chauncey Cruel, MD   10 mL at 06/19/15 1554    Physical Findings:  height is 5' 9"  (1.753 m) and weight is 216 lb 1.6 oz (98.022 kg). Her oral temperature is 98.3 F (36.8 C). Her blood pressure is 150/78 and her pulse is 77. Her respiration is 16 and oxygen saturation is 98%.   Pain scale 0/10 In general this is a well appearing Caucasian female in no acute distress. She's alert and oriented x4 and appropriate throughout the examination. Cardiopulmonary assessment is negative for acute distress and she exhibits normal effort. Evaluation of her ears bilaterally reveals dry skin and cerumen impacting the right ear obscuring the TM. On the left, the external auditory canal is intact without evidence of cerumen, though there is clear fluid effusion posterior to the TM.  Lab Findings: Lab Results  Component Value Date   WBC 4.0 10/05/2015   WBC 6.4 12/20/2014   WBC 11.6* 09/10/2014   HGB 12.1 10/05/2015   HGB 10.4* 12/20/2014   HGB 11.0* 09/10/2014   HCT 37.2 10/05/2015   HCT 32.8* 12/20/2014   HCT 35.1*  09/10/2014   PLT 128* 10/05/2015   PLT 222 12/20/2014    Lab Results  Component Value Date   NA 140 10/05/2015   NA 136 12/22/2014   K 3.6 10/05/2015   K 3.2* 12/22/2014   CHLORIDE 107 10/05/2015   CO2 25 10/05/2015   CO2 30 12/22/2014   GLUCOSE 121 10/05/2015   GLUCOSE 100* 12/22/2014   GLUCOSE 96 11/01/2012   BUN 11.6 10/05/2015   BUN <5* 12/22/2014   CREATININE 0.7 10/05/2015   CREATININE 0.67 12/22/2014   CREATININE 0.61 06/30/2013   BILITOT 0.64 10/05/2015   BILITOT 0.9 12/16/2014   ALKPHOS 87 10/05/2015   ALKPHOS 60 12/16/2014   AST 36* 10/05/2015   AST 18 12/16/2014   ALT 23 10/05/2015   ALT 18 12/16/2014   PROT 7.6 10/05/2015   PROT 5.0* 12/16/2014   ALBUMIN 3.1* 10/05/2015   ALBUMIN 2.4* 12/16/2014   CALCIUM 9.3 10/05/2015   CALCIUM 8.2* 12/22/2014   ANIONGAP 8 10/05/2015   ANIONGAP 6 12/22/2014    Radiographic Findings: Mr Jeri Cos Wo Contrast  10/13/2015  CLINICAL DATA:  Metastatic breast cancer. EXAM: MRI HEAD WITHOUT AND WITH CONTRAST TECHNIQUE: Multiplanar, multiecho pulse sequences of the brain and surrounding structures were obtained without and with intravenous contrast. CONTRAST:  72m MULTIHANCE GADOBENATE DIMEGLUMINE 529 MG/ML IV SOLN COMPARISON:  06/29/2015 FINDINGS: There is no evidence of acute infarct, mass, acute intracranial hemorrhage, midline shift, or extra-axial fluid collection. Small amount of susceptibility artifact along the cerebellar vermis is unchanged and may reflect old blood products. Patchy to confluent cerebral white matter T2 hyperintensities are unchanged and may reflect a combination of chronic small vessel ischemic disease and posttherapy changes. There is mild cerebral atrophy. No abnormal brain parenchymal or meningeal enhancement suggestive of metastatic disease is identified. A small developmental venous anomaly is incidentally noted in the left temporal lobe. An 11 mm left frontal skull lesion is unchanged. There is a  persistent large left mastoid effusion. Trace right mastoid fluid is also  noted. The paranasal sinuses are clear. Orbits are unremarkable. Major intracranial vascular flow voids are preserved. IMPRESSION: 1. No evidence of recurrent brain metastases. 2. Unchanged left frontal skull metastasis. Electronically Signed   By: Logan Bores M.D.   On: 10/13/2015 08:45    Impression/Plan: 1. Bilateral breast cancer with metastatic disease to the brain. The patient appears to be doing very well clinically, and radiographically she does not have evidence of disease. We discussed the findings, and she has been given a copy of her MRI. She was encouraged to call she has any questions or concerns prior to her next visit, and I will ask that she return for follow-up, 4 months time with repeat MRI scans prior to this. She states understanding of this. 2. Genetics. The patient has tested negative in 2013 for BRCA mutations.  3. Hearing loss with left tympanic effusion. We discussed the use of over-the-counter medications to lavage her right ear, but for the effusion, to use Sudafed for 2 weeks to relieve her eustachian tube dysfunction that is likely the cause of her uterus effusion. She also had a mastoid effusion, and has been offered an evaluation with urinalysis and throat specialty, however at this time she declines and would like to follow this expectantly.     Carola Rhine, PAC

## 2015-10-17 MED FILL — ANASTROZOLE 1 MG TABLET: 1 | 30 days supply | Qty: 30 | Fill #2

## 2015-10-18 MED FILL — KLOR-CON M20 TABLET: 20 | 30 days supply | Qty: 30 | Fill #2

## 2015-10-19 ENCOUNTER — Other Ambulatory Visit: Payer: Self-pay | Admitting: *Deleted

## 2015-10-19 DIAGNOSIS — C50812 Malignant neoplasm of overlapping sites of left female breast: Principal | ICD-10-CM

## 2015-10-19 DIAGNOSIS — C50811 Malignant neoplasm of overlapping sites of right female breast: Secondary | ICD-10-CM

## 2015-10-23 ENCOUNTER — Telehealth: Payer: Self-pay | Admitting: Oncology

## 2015-10-23 ENCOUNTER — Ambulatory Visit (HOSPITAL_BASED_OUTPATIENT_CLINIC_OR_DEPARTMENT_OTHER): Payer: Medicare Other | Admitting: Oncology

## 2015-10-23 ENCOUNTER — Ambulatory Visit (HOSPITAL_BASED_OUTPATIENT_CLINIC_OR_DEPARTMENT_OTHER): Payer: Medicare Other

## 2015-10-23 ENCOUNTER — Other Ambulatory Visit (HOSPITAL_BASED_OUTPATIENT_CLINIC_OR_DEPARTMENT_OTHER): Payer: Medicare Other

## 2015-10-23 VITALS — BP 135/64 | HR 90 | Temp 98.1°F | Resp 18 | Ht 69.0 in | Wt 215.8 lb

## 2015-10-23 DIAGNOSIS — C50911 Malignant neoplasm of unspecified site of right female breast: Secondary | ICD-10-CM

## 2015-10-23 DIAGNOSIS — J910 Malignant pleural effusion: Secondary | ICD-10-CM | POA: Diagnosis not present

## 2015-10-23 DIAGNOSIS — Z5112 Encounter for antineoplastic immunotherapy: Secondary | ICD-10-CM | POA: Diagnosis present

## 2015-10-23 DIAGNOSIS — C50812 Malignant neoplasm of overlapping sites of left female breast: Principal | ICD-10-CM

## 2015-10-23 DIAGNOSIS — C7951 Secondary malignant neoplasm of bone: Secondary | ICD-10-CM | POA: Diagnosis not present

## 2015-10-23 DIAGNOSIS — C50912 Malignant neoplasm of unspecified site of left female breast: Secondary | ICD-10-CM

## 2015-10-23 DIAGNOSIS — C50919 Malignant neoplasm of unspecified site of unspecified female breast: Secondary | ICD-10-CM

## 2015-10-23 DIAGNOSIS — Z86711 Personal history of pulmonary embolism: Secondary | ICD-10-CM | POA: Diagnosis not present

## 2015-10-23 DIAGNOSIS — C50811 Malignant neoplasm of overlapping sites of right female breast: Secondary | ICD-10-CM

## 2015-10-23 DIAGNOSIS — Z170 Estrogen receptor positive status [ER+]: Secondary | ICD-10-CM

## 2015-10-23 DIAGNOSIS — C7800 Secondary malignant neoplasm of unspecified lung: Principal | ICD-10-CM

## 2015-10-23 DIAGNOSIS — C7931 Secondary malignant neoplasm of brain: Secondary | ICD-10-CM

## 2015-10-23 LAB — COMPREHENSIVE METABOLIC PANEL
ALBUMIN: 3.2 g/dL — AB (ref 3.5–5.0)
ALK PHOS: 84 U/L (ref 40–150)
ALT: 25 U/L (ref 0–55)
AST: 35 U/L — AB (ref 5–34)
Anion Gap: 8 mEq/L (ref 3–11)
BUN: 12.7 mg/dL (ref 7.0–26.0)
CALCIUM: 9.5 mg/dL (ref 8.4–10.4)
CO2: 27 mEq/L (ref 22–29)
CREATININE: 0.7 mg/dL (ref 0.6–1.1)
Chloride: 106 mEq/L (ref 98–109)
EGFR: 82 mL/min/{1.73_m2} — ABNORMAL LOW (ref >90)
GLUCOSE: 145 mg/dL — AB (ref 70–140)
Potassium: 3.8 mEq/L (ref 3.5–5.1)
SODIUM: 140 meq/L (ref 136–145)
TOTAL PROTEIN: 7.6 g/dL (ref 6.4–8.3)
Total Bilirubin: 0.64 mg/dL (ref 0.20–1.20)

## 2015-10-23 LAB — CBC WITH DIFFERENTIAL/PLATELET
BASO%: 0.5 % (ref 0.0–2.0)
BASOS ABS: 0 10*3/uL (ref 0.0–0.1)
EOS%: 1.9 % (ref 0.0–7.0)
Eosinophils Absolute: 0.1 10*3/uL (ref 0.0–0.5)
HEMATOCRIT: 38 % (ref 34.8–46.6)
HEMOGLOBIN: 12.3 g/dL (ref 11.6–15.9)
LYMPH#: 1.2 10*3/uL (ref 0.9–3.3)
LYMPH%: 30.3 % (ref 14.0–49.7)
MCH: 30.1 pg (ref 25.1–34.0)
MCHC: 32.4 g/dL (ref 31.5–36.0)
MCV: 92.9 fL (ref 79.5–101.0)
MONO#: 0.5 10*3/uL (ref 0.1–0.9)
MONO%: 13.6 % (ref 0.0–14.0)
NEUT%: 53.7 % (ref 38.4–76.8)
NEUTROS ABS: 2 10*3/uL (ref 1.5–6.5)
Platelets: 132 10*3/uL — ABNORMAL LOW (ref 145–400)
RBC: 4.1 10*6/uL (ref 3.70–5.45)
RDW: 16.3 % — ABNORMAL HIGH (ref 11.2–14.5)
WBC: 3.8 10*3/uL — AB (ref 3.9–10.3)

## 2015-10-23 MED ORDER — ACETAMINOPHEN 325 MG PO TABS
ORAL_TABLET | ORAL | Status: AC
  Filled 2015-10-23: qty 2

## 2015-10-23 MED ORDER — ACETAMINOPHEN 325 MG PO TABS
650.0000 mg | ORAL_TABLET | Freq: Once | ORAL | Status: AC
  Administered 2015-10-23: 650 mg via ORAL

## 2015-10-23 MED ORDER — SODIUM CHLORIDE 0.9 % IV SOLN
3.5000 mg/kg | Freq: Once | INTRAVENOUS | Status: AC
  Administered 2015-10-23: 360 mg via INTRAVENOUS
  Filled 2015-10-23: qty 13

## 2015-10-23 MED ORDER — DIPHENHYDRAMINE HCL 25 MG PO CAPS
ORAL_CAPSULE | ORAL | Status: AC
  Filled 2015-10-23: qty 2

## 2015-10-23 MED ORDER — SODIUM CHLORIDE 0.9 % IV SOLN
Freq: Once | INTRAVENOUS | Status: AC
  Administered 2015-10-23: 12:00:00 via INTRAVENOUS

## 2015-10-23 MED ORDER — HEPARIN SOD (PORK) LOCK FLUSH 100 UNIT/ML IV SOLN
500.0000 [IU] | Freq: Once | INTRAVENOUS | Status: AC | PRN
  Administered 2015-10-23: 500 [IU]
  Filled 2015-10-23: qty 5

## 2015-10-23 MED ORDER — DIPHENHYDRAMINE HCL 25 MG PO CAPS
25.0000 mg | ORAL_CAPSULE | Freq: Once | ORAL | Status: AC
  Administered 2015-10-23: 25 mg via ORAL

## 2015-10-23 MED ORDER — SODIUM CHLORIDE 0.9 % IJ SOLN
10.0000 mL | INTRAMUSCULAR | Status: DC | PRN
  Administered 2015-10-23: 10 mL
  Filled 2015-10-23: qty 10

## 2015-10-23 NOTE — Progress Notes (Signed)
ID: [name]   DOB: [birth year]  MR#: [id number]  RCB#:[id number]  PCP: Linda Rio, PA-C GYN: SU: Linda [name] OTHER MD: Linda [name], Linda [name]  CHIEF COMPLAINT:  Bilateral Breast Cancers  CURRENT TREATMENT: Anastrozole, T-DM 1, zolendrnate  BREAST CANCER HISTORY: From the original intake note:  The patient noted a small amount of drainage from her left breast December of 2012. She brought it to her gynecologist's attention in January of 2013 and was set up for bilateral diagnostic mammography at the [facility] Center July 24, 2011. This was the patient's first ever mammogram. It showed a large irregular mass in the left retroareolar region extending to the nipple, with nipple retraction and skin thickening. This measured approximately 8.4 cm. It was associated with pleomorphic microcalcifications. By exam there was moderate distortion and retraction of the nipple with a large palpable ill-defined area in the retroareolar region. In the right right breast there was also a 2 cm hard mass palpated. Ultrasound of the right breast mass showed a complex cystic/solid area measuring 1.9 cm. Ultrasound of the right axilla was negative. Ultrasound of the left breast showed a large hypoechoic mass measuring at least 3.8 cm. The left axilla showed several adjacent abnormal appearing lymph nodes.  With this information biopsy of the right and left breast masses were obtained the same day, and showed ([id number])   (a) on the right, and invasive ductal carcinoma with papillary features, estrogen and progesterone receptor positive, HER-2 negative, with an MIB-1 of 10%.   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 The MIB-1 was 60% for the left-sided tumor.   With this information the patient was presented at the multidisciplinary breast cancer conference 08/06/2011. Subsequent  evaluation and treatments are as detailed below.  INTERVAL HISTORY: Linda [name] returns today for follow up of her Stage IV breast cancer.  She continues on TDM 1 given every 3 week she tolerated with no side effects that she is aware of. We just obtain an echocardiogram which shows a well-preserved ejection fraction. We also obtained a brain MRI which shows no recurrence of her brain lesions.  REVIEW OF SYSTEMS: Linda [name]  Generally he is "back to normal" driving, working 3 days a week. This morning she did have a brief episode of vertigo. She felt a little bit lightheaded in the room seems to spin. She thinks she may have been a little dehydrated. This has not recurred. Also her bridge fell out and she will need to have it repaired. Aside from these issues a detailed review of systems today was entirely benign  PAST MEDICAL HISTORY: Past Medical History  Diagnosis Date  . Cancer ([name]) 08-13-11    07-31-11-Dx. Bilarteral Breast cancer-left greater than rt.  . Hematuria, undiagnosed cause 08-13-11    Being evaluated by [facility] 08-14-11  . Hypertension   . Bronchitis     hx  . GERD (gastroesophageal reflux disease)     doing well  . Breast cancer ([place]) 07/30/11 dx    Right  invasive ductal ca 7 0'clock,& left breast=invasive ductal ca  and dcis, left axilla nlymph node, metastatic ca  . Seroma 02/04/12    right breast  200cc removed  erythema on right side  . Seasonal allergies   . History of radiation therapy 02/20/12-04/15/12    left breast,total 60.4 Gy  . Wears partial dentures     wears upper and lower  partial  . Allergy   . Blood transfusion without reported diagnosis   . 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   . Cancer of central portion of left female breast ([place]) 08/01/2011  . S/P radiation therapy 10/09/14-10/27/14    whole brain 37.5Gy/42f  . Acute pulmonary embolism ([facility] 12/13/2014  . Acute on chronic diastolic CHF (congestive heart  failure) [place]     PAST SURGICAL HISTORY: Past Surgical History  Procedure Laterality Date  . Child birth  08-13-11    x3 -NVD  . Portacath placement  08/15/2011    Procedure: INSERTION PORT-A-CATH;  Surgeon: DHarl Bowie MD;  Location: [facility];  Service: General;  Laterality: N/A;  . Mastectomy w/ sentinel node biopsy  01/21/2012    Procedure: MASTECTOMY WITH SENTINEL LYMPH NODE BIOPSY;  Surgeon: DHarl Bowie MD;  [facility]  Service: General;  Laterality: Bilateral;  Left modified radical mastectomy, Rt mastectomy with Sentinel lymphnode biospy  . Breast surgery    . Port-a-cath removal Right 12/22/2012    Procedure: REMOVAL PORT-A-CATH;  Surgeon: DHarl Bowie MD;  [facility]  Service: General;  Laterality: Right;  . Subxyphoid pericardial window N/A 12/14/2014    Procedure: SUBXYPHOID PERICARDIAL WINDOW;  Surgeon: CRexene Alberts MD;  [facility] [facility]  Service: Thoracic;  Laterality: N/A;  . Tee without cardioversion N/A 12/14/2014    Procedure: TRANSESOPHAGEAL ECHOCARDIOGRAM (TEE);  Surgeon: CRexene Alberts MD;  [facility]  Service: Thoracic;  Laterality: N/A;  . Chest tube insertion Left 12/14/2014    Procedure: CHEST TUBE INSERTION;  Surgeon: CRexene Alberts MD;  [facility];  Service: Thoracic;  Laterality: Left;    FAMILY HISTORY Family History  Problem Relation Age of Onset  . Heart disease Mother   . Cancer Mother 428   breast, , [id number]  . Heart attack Father   . Cancer Sister 674   breast  . Colon cancer Neg Hx   The patient's father died from a heart attack at the age of 862 The patient's mother died from apparently heart problems at the age of 85 The patient had no brothers. She has 3 sisters. One of her sisters was diagnosed with breast cancer in her mid 617s The patient does not know if his sister was ever genetically tested. The patient's mother had a mastectomy at the age of 371  presumably for breast cancer. There is no other history of breast or in cancer in the family to her knowledge.   GYNECOLOGIC HISTORY:  She does not recall when she had menarche. She had her first child at age 70 She is GX P3. She underwent menopause in her mid 423s She never took hormone replacement.   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 [place] a cGames developer[place] Son CGerald [place]ab[place]ives [place] as a fAirline pilot His wife is a nMarine scientist Son DShanon Browlives at WBed [employer]and is a dAdvertising account executivein addition to having the mTenneco Inc The patient attends a local BLehman Brothershere   ADVANCED DIRECTIVES: Not in place.  Patient has beengiven the appropriate forms to complete and notarize at her discretion. She tells me she ihas named her daughter-in-law BJacqlyn Larsenas  her healthcare power of attorney  HEALTH MAINTENANCE: Social History  Substance Use Topics  . Smoking status: Never Smoker   . Smokeless tobacco: Never Used  . Alcohol Use: 0.0 oz/week    0 Standard drinks or equivalent per week     Comment: rare- occ.     Colonoscopy: Never  PAP: Feb 2013  Bone density: November 2013, normal  Lipid panel:   Allergies  Allergen Reactions  . Ace Inhibitors Cough    Current Outpatient Prescriptions  Medication Sig Dispense Refill  . anastrozole (ARIMIDEX) 1 MG tablet Take 1 tablet (1 mg total) by mouth daily. 30 tablet 3  . b complex vitamins tablet Take 1 tablet by mouth daily.    . cholecalciferol (VITAMIN D) 1000 UNITS tablet Take 1 tablet (1,000 Units total) by mouth daily. 100 tablet 12  . docusate sodium (COLACE) 100 MG capsule Take 1 capsule (100 mg total) by mouth 2 (two) times daily. 10 capsule 0  . furosemide (LASIX) 20 MG tablet Take 1 tablet (20 mg total) by mouth 2 (two) times a week. 30  tablet 12  . HYDROcodone-homatropine (HYCODAN) 5-1.5 MG/5ML syrup Take 5 mLs by mouth every 6 (six) hours as needed for cough. (Patient not taking: Reported on 09/11/2015) 240 mL 0  . magnesium oxide (MAG-OX) 400 (241.3 Mg) MG tablet Take 1 tablet (400 mg total) by mouth daily. 30 tablet 12  . Multiple Vitamins-Iron (MULTIVITAMINS WITH IRON) TABS Take 1 tablet by mouth daily.    . potassium chloride SA (K-DUR,KLOR-CON) 20 MEQ tablet Take 1 tablet (20 mEq total) by mouth daily. 30 tablet 12   No current facility-administered medications for this visit.   Facility-Administered Medications Ordered in Other Visits  Medication Dose Route Frequency Provider Last Rate Last Dose  . sodium chloride 0.9 % injection 10 mL  10 mL Intracatheter PRN Chauncey Cruel, MD   10 mL at 06/19/15 1554    OBJECTIVE: [name]  Who appears stated age  70 Vitals:   10/23/15 1049  BP: 135/64  Pulse: 90  Temp: 98.1 F (36.7 C)  Resp: 18     Body mass index is 31.85 kg/(m^2).    ECOG FS: 0 Filed Weights   10/23/15 1049  Weight: 215 lb 12.8 oz (97.886 kg)    Sclerae unicteric, pupils round and equal Oropharynx clear and moist-- no thrush or other lesions No cervical or supraclavicular adenopathy Lungs no rales or rhonchi Heart regular rate and rhythm Abd soft, nontender, positive bowel sounds MSK no focal spinal tenderness, no upper extremity lymphedema Neuro: nonfocal, well oriented, appropriate affect Breasts:  deferred     LAB RESULTS: CBC Latest Ref Rng 10/23/2015 10/05/2015 09/11/2015  WBC 3.9 - 10.3 10e3/uL 3.8(L) 4.0 5.1  Hemoglobin 11.6 - 15.9 g/dL 12.3 12.1 12.4  Hematocrit 34.8 - 46.6 % 38.0 37.2 38.0  Platelets 145 - 400 10e3/uL 132(L) 128(L) 156   CMP Latest Ref Rng 10/23/2015 10/05/2015 09/11/2015  Glucose 70 - 140 mg/dl 145(H) 121 158(H)  BUN 7.0 - 26.0 mg/dL 12.7 11.6 16.3  Creatinine 0.6 - 1.1 mg/dL 0.7 0.7 0.8  Sodium 136 - 145 mEq/L 140 140 142  Potassium 3.5 - 5.1  mEq/L 3.8 3.6 3.9  CO2 22 - 29 mEq/L 27 25 30(H)  Calcium 8.4 - 10.4 mg/dL 9.5 9.3 9.5  Total Protein 6.4 - 8.3 g/dL 7.6 7.6 8.2  Total Bilirubin 0.20 - 1.20 mg/dL 0.64 0.64 0.50  Alkaline Phos 40 - 150  U/L 84 87 86  AST 5 - 34 U/L 35(H) 36(H) 36(H)  ALT 0 - 55 U/L _0 STUDIES: .Mr Jeri Cos Wo Contrast  10/13/2015  CLINICAL DATA:  Metastatic breast cancer. EXAM: MRI HEAD WITHOUT AND WITH CONTRAST TECHNIQUE: Multiplanar, multiecho pulse sequences of the brain and surrounding structures were obtained without and with intravenous contrast. CONTRAST:  64m MULTIHANCE GADOBENATE DIMEGLUMINE 529 MG/ML IV SOLN COMPARISON:  06/29/2015 FINDINGS: There is no evidence of acute infarct, mass, acute intracranial hemorrhage, midline shift, or extra-axial fluid collection. Small amount of susceptibility artifact along the cerebellar vermis is unchanged and may reflect old blood products. Patchy to confluent cerebral white matter T2 hyperintensities are unchanged and may reflect a combination of chronic small vessel ischemic disease and posttherapy changes. There is mild cerebral atrophy. No abnormal brain parenchymal or meningeal enhancement suggestive of metastatic disease is identified. A small developmental venous anomaly is incidentally noted in the left temporal lobe. An 11 mm left frontal skull lesion is unchanged. There is a persistent large left mastoid effusion. Trace right mastoid fluid is also noted. The paranasal sinuses are clear. Orbits are unremarkable. Major intracranial vascular flow voids are preserved. IMPRESSION: 1. No evidence of recurrent brain metastases. 2. Unchanged left frontal skull metastasis. Electronically Signed   By: ALogan BoresM.D.   On: 10/13/2015 08:45    ASSESSMENT: 70y.o.  [place] woman   (1)  status post bilateral breast biopsies 07/24/2011, showing,      (a) on the right, a clinical T2 N0 papillary/ductal breast cancer, estrogen and progesterone receptor  positive, HER-2 negative, with an MIB-1 of 10%;     (b) on the left, a clinical T3 N1, stage IIIA invasive ductal carcinoma, grade 3, triple positive, with an MIB-1 of 60%.  (2)  Status post 4 dose dense cycles of doxorubicin/ cyclophosphamide followed by 4 dose dense cycles of paclitaxel and trastuzumab completed 12/09/2011  (3) the trastuzumab was continued for a total of one year (to 11/01/2012). Final echo on 11/04/2012 showing a well preserved ejection fraction of 55-60%.  (4) s/p bilateral mastectomies 01/21/2012 showing   (a) on the Right, an 8 mm invasive papillary carcinoma, grade 1, ypT1b ypN0   (b) on the Left, multiple microscopic foci of residual  Invasive ductal carcinoma with evidence of dermal lymphatic involvement, pyT1a/T4 pyN0  (5)  Postmastectomy radiation, completed 04/15/2012  (6) Started anastrazole 04/16/2012; [facility], bones, left effusion (7) CT angiogram 09/11/2014 shows new left pericardial effusion and new mediastinal and hilar lymphadenopathy; there were no suspicious upper abdominal findings; Cytology from the left effusion 09/11/2014 ([id number]) shows malignant cells which are HER-2 positive  (8) Whole body bone scan on 09/25/14 showed metastatic foci throughout axial and appendicular skeleton. Areas of most potential concern include disease in the thoracic spine, disease in the right acetabulum, right superior pubic ramus and possibly proximal right femur, disease in the right humeral shaft and in both femoral shafts.  (8) Started trastuzumab/pertuzumab 09/26/2014, discontinued after 11/29/2014 dose, w progression  (a) T-DM1 started 01/02/2015  (b) echocardiogram 10/03/2015 shows an ejection fraction of 55-60%  (9) Brain MRI on 09/25/14 was positive for numerous small enhancing brain mets and calvarium/bone mets   (a) whole brain radiation on 10/09/14--10/27/2014  (b) brain MRI  01/07/2015 shows a complete clinical response  (c) REPEAT BRAIN mri 03/18/2015 SHOWS NO NEW LESIONS  (d) repeat brain MRI  with and without contrast 06/29/2015 continues to show no recurrent lesions in the brain  (e) brain MRI 10/12/2015 shows no evidence of recurrent brain metastases. She does have a large left mastoid effusion with trace right mastoid fluid.  (10) started zolendronate 11/29/2014, to be repeated every 12 weeks, most recent dose 08/21/2015  11) left pleural effusion re-tapped 12/08/2014 (a) left chest tube placement 12/14/2014, removedd 12/21/2014  (12) large pericardial effusion per echo 12/13/2014  (a) pericardial window placement 12/14/2014; fluid is hemorrhagic; path negative  (13) Right-sided pulmonary emboli noted on CT scan 12/13/2014  (a) no pulmonary emboli demonstrated on non-dedicated chest CT 04/13/2015  PLAN: Linda [name]  Is doing remarkably well on her current treatment consisting of T-DM 1 and anastrozole area she has essentially no side effects that she reports from these treatments. She has had no recurrence in the brain, and remains very functional.  I would prefer to avoid extractions her implants because she will be on zolendronate long-term. Her next dose will be next month. No problems with any other dental work.  I don't have a simple explanation for her episode of vertigo. If it recurs we will refer her to physical therapy.  Otherwise we are continuing the T-DM 1 every 3 weeks. She will have a repeat echocardiogram  And repeat brain MRI early August, and she will see me mid August. She knows to call for any problems that might develop before that visit. Chauncey Cruel, MD

## 2015-10-23 NOTE — Telephone Encounter (Signed)
appt made and avs printed °

## 2015-10-23 NOTE — Patient Instructions (Signed)
[facility] Discharge Instructions for Patients Receiving Chemotherapy  Today you received the following chemotherapy agents: Kadcyla  To help prevent nausea and vomiting after your treatment, we encourage you to take your nausea medication as directed.    If you develop nausea and vomiting that is not controlled by your nausea medication, call the clinic.   BELOW ARE SYMPTOMS THAT SHOULD BE REPORTED IMMEDIATELY:  *FEVER GREATER THAN 100.5 F  *CHILLS WITH OR WITHOUT FEVER  NAUSEA AND VOMITING THAT IS NOT CONTROLLED WITH YOUR NAUSEA MEDICATION  *UNUSUAL SHORTNESS OF BREATH  *UNUSUAL BRUISING OR BLEEDING  TENDERNESS IN MOUTH AND THROAT WITH OR WITHOUT PRESENCE OF ULCERS  *URINARY PROBLEMS  *BOWEL PROBLEMS  UNUSUAL RASH Items with * indicate a potential emergency and should be followed up as soon as possible.  Feel free to call the clinic you have any questions or concerns. The clinic phone number is [phone].  Please show the CHEMO ALERT CARD at check-in to the Emergency Department and triage nurse.   

## 2015-11-01 ENCOUNTER — Telehealth: Payer: Self-pay | Admitting: *Deleted

## 2015-11-01 NOTE — Telephone Encounter (Signed)
Received a call from pt concerning the fact she had a root canal, no extractions and wanted to know about taking OTC pain relievers for pain. No answer I left a message to VM for pt to call this nurse back so she can tell me the instructions from her dentist given to her.

## 2015-11-02 NOTE — Patient Outreach (Signed)
[facility]) Care Management  11/02/2015  Linda [name] 1946-04-13 [id number]   Patient contacted via EMMI Prevent calls, patient requested additional information.  Pamphlet, magnet and letter mailed.  Thanks, Ronnell Freshwater. [place], [place] Assistant Phone: [phone] Fax: [phone]

## 2015-11-13 ENCOUNTER — Other Ambulatory Visit: Payer: Self-pay | Admitting: *Deleted

## 2015-11-13 DIAGNOSIS — C50912 Malignant neoplasm of unspecified site of left female breast: Principal | ICD-10-CM

## 2015-11-13 DIAGNOSIS — C50911 Malignant neoplasm of unspecified site of right female breast: Secondary | ICD-10-CM

## 2015-11-14 ENCOUNTER — Ambulatory Visit (HOSPITAL_BASED_OUTPATIENT_CLINIC_OR_DEPARTMENT_OTHER): Payer: Medicare Other

## 2015-11-14 ENCOUNTER — Other Ambulatory Visit (HOSPITAL_BASED_OUTPATIENT_CLINIC_OR_DEPARTMENT_OTHER): Payer: Medicare Other

## 2015-11-14 VITALS — BP 142/66 | HR 79 | Temp 98.6°F | Resp 18

## 2015-11-14 DIAGNOSIS — C50112 Malignant neoplasm of central portion of left female breast: Secondary | ICD-10-CM

## 2015-11-14 DIAGNOSIS — C50912 Malignant neoplasm of unspecified site of left female breast: Secondary | ICD-10-CM

## 2015-11-14 DIAGNOSIS — C50811 Malignant neoplasm of overlapping sites of right female breast: Secondary | ICD-10-CM

## 2015-11-14 DIAGNOSIS — C7931 Secondary malignant neoplasm of brain: Secondary | ICD-10-CM | POA: Diagnosis not present

## 2015-11-14 DIAGNOSIS — Z5112 Encounter for antineoplastic immunotherapy: Secondary | ICD-10-CM

## 2015-11-14 DIAGNOSIS — C7951 Secondary malignant neoplasm of bone: Secondary | ICD-10-CM | POA: Diagnosis not present

## 2015-11-14 DIAGNOSIS — J910 Malignant pleural effusion: Secondary | ICD-10-CM

## 2015-11-14 DIAGNOSIS — C50511 Malignant neoplasm of lower-outer quadrant of right female breast: Secondary | ICD-10-CM | POA: Diagnosis present

## 2015-11-14 DIAGNOSIS — C50911 Malignant neoplasm of unspecified site of right female breast: Secondary | ICD-10-CM

## 2015-11-14 DIAGNOSIS — C50812 Malignant neoplasm of overlapping sites of left female breast: Secondary | ICD-10-CM

## 2015-11-14 LAB — COMPREHENSIVE METABOLIC PANEL
ALBUMIN: 3.2 g/dL — AB (ref 3.5–5.0)
ALK PHOS: 87 U/L (ref 40–150)
ALT: 36 U/L (ref 0–55)
ANION GAP: 6 meq/L (ref 3–11)
AST: 45 U/L — AB (ref 5–34)
BUN: 15.2 mg/dL (ref 7.0–26.0)
CALCIUM: 9.6 mg/dL (ref 8.4–10.4)
CHLORIDE: 106 meq/L (ref 98–109)
CO2: 28 mEq/L (ref 22–29)
Creatinine: 0.7 mg/dL (ref 0.6–1.1)
EGFR: 88 mL/min/{1.73_m2} — AB (ref >90)
Glucose: 108 mg/dl (ref 70–140)
Potassium: 4 mEq/L (ref 3.5–5.1)
Sodium: 140 mEq/L (ref 136–145)
Total Bilirubin: 0.6 mg/dL (ref 0.20–1.20)
Total Protein: 7.7 g/dL (ref 6.4–8.3)

## 2015-11-14 LAB — CBC WITH DIFFERENTIAL/PLATELET
BASO%: 0.2 % (ref 0.0–2.0)
BASOS ABS: 0 10*3/uL (ref 0.0–0.1)
EOS ABS: 0.1 10*3/uL (ref 0.0–0.5)
EOS%: 2.2 % (ref 0.0–7.0)
HEMATOCRIT: 38.3 % (ref 34.8–46.6)
HEMOGLOBIN: 12.4 g/dL (ref 11.6–15.9)
LYMPH#: 1.3 10*3/uL (ref 0.9–3.3)
LYMPH%: 28.7 % (ref 14.0–49.7)
MCH: 31.1 pg (ref 25.1–34.0)
MCHC: 32.4 g/dL (ref 31.5–36.0)
MCV: 96 fL (ref 79.5–101.0)
MONO#: 0.6 10*3/uL (ref 0.1–0.9)
MONO%: 13 % (ref 0.0–14.0)
NEUT#: 2.6 10*3/uL (ref 1.5–6.5)
NEUT%: 55.9 % (ref 38.4–76.8)
PLATELETS: 125 10*3/uL — AB (ref 145–400)
RBC: 3.99 10*6/uL (ref 3.70–5.45)
RDW: 15.4 % — ABNORMAL HIGH (ref 11.2–14.5)
WBC: 4.6 10*3/uL (ref 3.9–10.3)

## 2015-11-14 MED ORDER — DIPHENHYDRAMINE HCL 25 MG PO CAPS
25.0000 mg | ORAL_CAPSULE | Freq: Once | ORAL | Status: AC
  Administered 2015-11-14: 25 mg via ORAL

## 2015-11-14 MED ORDER — ZOLEDRONIC ACID 4 MG/100ML IV SOLN
4.0000 mg | Freq: Once | INTRAVENOUS | Status: AC
  Administered 2015-11-14: 4 mg via INTRAVENOUS
  Filled 2015-11-14: qty 100

## 2015-11-14 MED ORDER — ACETAMINOPHEN 325 MG PO TABS
650.0000 mg | ORAL_TABLET | Freq: Once | ORAL | Status: AC
Start: 2015-11-14 — End: 2015-11-14
  Administered 2015-11-14: 650 mg via ORAL

## 2015-11-14 MED ORDER — ACETAMINOPHEN 325 MG PO TABS
ORAL_TABLET | ORAL | Status: AC
  Filled 2015-11-14: qty 2

## 2015-11-14 MED ORDER — SODIUM CHLORIDE 0.9 % IJ SOLN
10.0000 mL | INTRAMUSCULAR | Status: DC | PRN
  Administered 2015-11-14: 10 mL
  Filled 2015-11-14: qty 10

## 2015-11-14 MED ORDER — SODIUM CHLORIDE 0.9 % IV SOLN
Freq: Once | INTRAVENOUS | Status: AC
  Administered 2015-11-14: 12:00:00 via INTRAVENOUS

## 2015-11-14 MED ORDER — DIPHENHYDRAMINE HCL 25 MG PO CAPS
ORAL_CAPSULE | ORAL | Status: AC
  Filled 2015-11-14: qty 1

## 2015-11-14 MED ORDER — SODIUM CHLORIDE 0.9 % IV SOLN
3.5000 mg/kg | Freq: Once | INTRAVENOUS | Status: AC
  Administered 2015-11-14: 360 mg via INTRAVENOUS
  Filled 2015-11-14: qty 13

## 2015-11-14 MED ORDER — HEPARIN SOD (PORK) LOCK FLUSH 100 UNIT/ML IV SOLN
500.0000 [IU] | Freq: Once | INTRAVENOUS | Status: AC | PRN
  Administered 2015-11-14: 500 [IU]
  Filled 2015-11-14: qty 5

## 2015-11-14 NOTE — Patient Instructions (Addendum)
[facility] Discharge Instructions for Patients Receiving Chemotherapy  Today you received the following chemotherapy agents Kadcyla and zometa  To help prevent nausea and vomiting after your treatment, we encourage you to take your nausea medication   If you develop nausea and vomiting that is not controlled by your nausea medication, call the clinic.   BELOW ARE SYMPTOMS THAT SHOULD BE REPORTED IMMEDIATELY:  *FEVER GREATER THAN 100.5 F  *CHILLS WITH OR WITHOUT FEVER  NAUSEA AND VOMITING THAT IS NOT CONTROLLED WITH YOUR NAUSEA MEDICATION  *UNUSUAL SHORTNESS OF BREATH  *UNUSUAL BRUISING OR BLEEDING  TENDERNESS IN MOUTH AND THROAT WITH OR WITHOUT PRESENCE OF ULCERS  *URINARY PROBLEMS  *BOWEL PROBLEMS  UNUSUAL RASH Items with * indicate a potential emergency and should be followed up as soon as possible.  Feel free to call the clinic you have any questions or concerns. The clinic phone number is [phone].  Please show the [facility] at check-in to the Emergency Department and triage nurse.  Zoledronic Acid injection (Hypercalcemia, Oncology) What is this medicine? ZOLEDRONIC ACID (ZOE le dron ik AS id) lowers the amount of calcium loss from bone. It is used to treat too much calcium in your blood from cancer. It is also used to prevent complications of cancer that has spread to the bone. This medicine may be used for other purposes; ask your health care provider or pharmacist if you have questions. What should I tell my health care provider before I take this medicine? They need to know if you have any of these conditions: -aspirin-sensitive asthma -cancer, especially if you are receiving medicines used to treat cancer -dental disease or wear dentures -infection -kidney disease -receiving corticosteroids like dexamethasone or prednisone -an unusual or allergic reaction to zoledronic acid, other medicines, foods, dyes, or preservatives -pregnant  or trying to get pregnant -breast-feeding How should I use this medicine? This medicine is for infusion into a vein. It is given by a health care professional in a hospital or clinic setting. Talk to your pediatrician regarding the use of this medicine in children. Special care may be needed. Overdosage: If you think you have taken too much of this medicine contact a poison control center or emergency room at once. NOTE: This medicine is only for you. Do not share this medicine with others. What if I miss a dose? It is important not to miss your dose. Call your doctor or health care professional if you are unable to keep an appointment. What may interact with this medicine? -certain antibiotics given by injection -NSAIDs, medicines for pain and inflammation, like ibuprofen or naproxen -some diuretics like bumetanide, furosemide -teriparatide -thalidomide This list may not describe all possible interactions. Give your health care provider a list of all the medicines, herbs, non-prescription drugs, or dietary supplements you use. Also tell them if you smoke, drink alcohol, or use illegal drugs. Some items may interact with your medicine. What should I watch for while using this medicine? Visit your doctor or health care professional for regular checkups. It may be some time before you see the benefit from this medicine. Do not stop taking your medicine unless your doctor tells you to. Your doctor may order blood tests or other tests to see how you are doing. Women should inform their doctor if they wish to become pregnant or think they might be pregnant. There is a potential for serious side effects to an unborn child. Talk to your health care professional or  pharmacist for more information. You should make sure that you get enough calcium and vitamin D while you are taking this medicine. Discuss the foods you eat and the vitamins you take with your health care professional. Some people who take  this medicine have severe bone, joint, and/or muscle pain. This medicine may also increase your risk for jaw problems or a broken thigh bone. Tell your doctor right away if you have severe pain in your jaw, bones, joints, or muscles. Tell your doctor if you have any pain that does not go away or that gets worse. Tell your dentist and dental surgeon that you are taking this medicine. You should not have major dental surgery while on this medicine. See your dentist to have a dental exam and fix any dental problems before starting this medicine. Take good care of your teeth while on this medicine. Make sure you see your dentist for regular follow-up appointments. What side effects may I notice from receiving this medicine? Side effects that you should report to your doctor or health care professional as soon as possible: -allergic reactions like skin rash, itching or hives, swelling of the face, lips, or tongue -anxiety, confusion, or depression -breathing problems -changes in vision -eye pain -feeling faint or lightheaded, falls -jaw pain, especially after dental work -mouth sores -muscle cramps, stiffness, or weakness -redness, blistering, peeling or loosening of the skin, including inside the mouth -trouble passing urine or change in the amount of urine Side effects that usually do not require medical attention (report to your doctor or health care professional if they continue or are bothersome): -bone, joint, or muscle pain -constipation -diarrhea -fever -hair loss -irritation at site where injected -loss of appetite -nausea, vomiting -stomach upset -trouble sleeping -trouble swallowing -weak or tired This list may not describe all possible side effects. Call your doctor for medical advice about side effects. You may report side effects to FDA at 1-800-FDA-1088. Where should I keep my medicine? This drug is given in a hospital or clinic and will not be stored at home. NOTE: This  sheet is a summary. It may not cover all possible information. If you have questions about this medicine, talk to your doctor, pharmacist, or health care provider.    2016, Elsevier/Gold Standard. (2013-10-29 14:19:39)

## 2015-11-15 MED FILL — FUROSEMIDE 20 MG TABLET: 20 | 84 days supply | Qty: 24 | Fill #1

## 2015-11-15 MED FILL — ANASTROZOLE 1 MG TABLET: 1 | 30 days supply | Qty: 30 | Fill #3

## 2015-11-15 MED FILL — KLOR-CON M20 TABLET: 20 | 30 days supply | Qty: 30 | Fill #3

## 2015-12-04 ENCOUNTER — Other Ambulatory Visit: Payer: Self-pay

## 2015-12-04 ENCOUNTER — Other Ambulatory Visit (HOSPITAL_BASED_OUTPATIENT_CLINIC_OR_DEPARTMENT_OTHER): Payer: Medicare Other

## 2015-12-04 ENCOUNTER — Ambulatory Visit (HOSPITAL_BASED_OUTPATIENT_CLINIC_OR_DEPARTMENT_OTHER): Payer: Medicare Other

## 2015-12-04 VITALS — BP 138/67 | HR 69 | Temp 98.3°F | Resp 17

## 2015-12-04 DIAGNOSIS — C7931 Secondary malignant neoplasm of brain: Secondary | ICD-10-CM

## 2015-12-04 DIAGNOSIS — C7951 Secondary malignant neoplasm of bone: Secondary | ICD-10-CM

## 2015-12-04 DIAGNOSIS — C50912 Malignant neoplasm of unspecified site of left female breast: Principal | ICD-10-CM

## 2015-12-04 DIAGNOSIS — C50919 Malignant neoplasm of unspecified site of unspecified female breast: Secondary | ICD-10-CM

## 2015-12-04 DIAGNOSIS — Z5112 Encounter for antineoplastic immunotherapy: Secondary | ICD-10-CM | POA: Diagnosis present

## 2015-12-04 DIAGNOSIS — C50511 Malignant neoplasm of lower-outer quadrant of right female breast: Secondary | ICD-10-CM

## 2015-12-04 DIAGNOSIS — J910 Malignant pleural effusion: Secondary | ICD-10-CM | POA: Diagnosis not present

## 2015-12-04 DIAGNOSIS — C7800 Secondary malignant neoplasm of unspecified lung: Secondary | ICD-10-CM

## 2015-12-04 DIAGNOSIS — C50811 Malignant neoplasm of overlapping sites of right female breast: Secondary | ICD-10-CM

## 2015-12-04 DIAGNOSIS — C50911 Malignant neoplasm of unspecified site of right female breast: Secondary | ICD-10-CM

## 2015-12-04 DIAGNOSIS — C50112 Malignant neoplasm of central portion of left female breast: Secondary | ICD-10-CM

## 2015-12-04 DIAGNOSIS — C50812 Malignant neoplasm of overlapping sites of left female breast: Secondary | ICD-10-CM

## 2015-12-04 LAB — CBC WITH DIFFERENTIAL/PLATELET
BASO%: 0.7 % (ref 0.0–2.0)
Basophils Absolute: 0 10*3/uL (ref 0.0–0.1)
EOS ABS: 0.1 10*3/uL (ref 0.0–0.5)
EOS%: 1.5 % (ref 0.0–7.0)
HCT: 37 % (ref 34.8–46.6)
HEMOGLOBIN: 12.2 g/dL (ref 11.6–15.9)
LYMPH%: 27.7 % (ref 14.0–49.7)
MCH: 30.7 pg (ref 25.1–34.0)
MCHC: 33 g/dL (ref 31.5–36.0)
MCV: 92.9 fL (ref 79.5–101.0)
MONO#: 0.8 10*3/uL (ref 0.1–0.9)
MONO%: 15.5 % — AB (ref 0.0–14.0)
NEUT%: 54.6 % (ref 38.4–76.8)
NEUTROS ABS: 2.8 10*3/uL (ref 1.5–6.5)
Platelets: 124 10*3/uL — ABNORMAL LOW (ref 145–400)
RBC: 3.98 10*6/uL (ref 3.70–5.45)
RDW: 15.5 % — AB (ref 11.2–14.5)
WBC: 5.1 10*3/uL (ref 3.9–10.3)
lymph#: 1.4 10*3/uL (ref 0.9–3.3)

## 2015-12-04 LAB — COMPREHENSIVE METABOLIC PANEL
ALBUMIN: 3 g/dL — AB (ref 3.5–5.0)
ALT: 34 U/L (ref 0–55)
AST: 47 U/L — AB (ref 5–34)
Alkaline Phosphatase: 97 U/L (ref 40–150)
Anion Gap: 7 mEq/L (ref 3–11)
BUN: 13 mg/dL (ref 7.0–26.0)
CO2: 29 meq/L (ref 22–29)
Calcium: 9.7 mg/dL (ref 8.4–10.4)
Chloride: 105 mEq/L (ref 98–109)
Creatinine: 0.7 mg/dL (ref 0.6–1.1)
EGFR: 87 mL/min/{1.73_m2} — ABNORMAL LOW (ref >90)
Glucose: 87 mg/dl (ref 70–140)
Potassium: 3.9 mEq/L (ref 3.5–5.1)
SODIUM: 141 meq/L (ref 136–145)
TOTAL PROTEIN: 7.8 g/dL (ref 6.4–8.3)
Total Bilirubin: 0.73 mg/dL (ref 0.20–1.20)

## 2015-12-04 MED ORDER — ACETAMINOPHEN 325 MG PO TABS
ORAL_TABLET | ORAL | Status: AC
  Filled 2015-12-04: qty 2

## 2015-12-04 MED ORDER — DIPHENHYDRAMINE HCL 25 MG PO CAPS
ORAL_CAPSULE | ORAL | Status: AC
  Filled 2015-12-04: qty 1

## 2015-12-04 MED ORDER — SODIUM CHLORIDE 0.9 % IJ SOLN
10.0000 mL | INTRAMUSCULAR | Status: DC | PRN
  Administered 2015-12-04: 10 mL
  Filled 2015-12-04: qty 10

## 2015-12-04 MED ORDER — SODIUM CHLORIDE 0.9 % IV SOLN
3.5000 mg/kg | Freq: Once | INTRAVENOUS | Status: AC
  Administered 2015-12-04: 360 mg via INTRAVENOUS
  Filled 2015-12-04: qty 13

## 2015-12-04 MED ORDER — ACETAMINOPHEN 325 MG PO TABS
650.0000 mg | ORAL_TABLET | Freq: Once | ORAL | Status: AC
  Administered 2015-12-04: 650 mg via ORAL

## 2015-12-04 MED ORDER — DIPHENHYDRAMINE HCL 25 MG PO CAPS
25.0000 mg | ORAL_CAPSULE | Freq: Once | ORAL | Status: AC
  Administered 2015-12-04: 25 mg via ORAL

## 2015-12-04 MED ORDER — HEPARIN SOD (PORK) LOCK FLUSH 100 UNIT/ML IV SOLN
500.0000 [IU] | Freq: Once | INTRAVENOUS | Status: AC | PRN
Start: 2015-12-04 — End: 2015-12-04
  Administered 2015-12-04: 500 [IU]
  Filled 2015-12-04: qty 5

## 2015-12-04 MED ORDER — SODIUM CHLORIDE 0.9 % IV SOLN
Freq: Once | INTRAVENOUS | Status: AC
  Administered 2015-12-04: 11:00:00 via INTRAVENOUS

## 2015-12-04 NOTE — Patient Instructions (Signed)
[facility] Discharge Instructions for Patients Receiving Chemotherapy  Today you received the following chemotherapy agents:  Kadcyla  To help prevent nausea and vomiting after your treatment, we encourage you to take your nausea medication as prescribed.   If you develop nausea and vomiting that is not controlled by your nausea medication, call the clinic.   BELOW ARE SYMPTOMS THAT SHOULD BE REPORTED IMMEDIATELY:  *FEVER GREATER THAN 100.5 F  *CHILLS WITH OR WITHOUT FEVER  NAUSEA AND VOMITING THAT IS NOT CONTROLLED WITH YOUR NAUSEA MEDICATION  *UNUSUAL SHORTNESS OF BREATH  *UNUSUAL BRUISING OR BLEEDING  TENDERNESS IN MOUTH AND THROAT WITH OR WITHOUT PRESENCE OF ULCERS  *URINARY PROBLEMS  *BOWEL PROBLEMS  UNUSUAL RASH Items with * indicate a potential emergency and should be followed up as soon as possible.  Feel free to call the clinic you have any questions or concerns. The clinic phone number is [phone].  Please show the CHEMO ALERT CARD at check-in to the Emergency Department and triage nurse.   

## 2015-12-06 ENCOUNTER — Telehealth: Payer: Self-pay | Admitting: *Deleted

## 2015-12-06 ENCOUNTER — Other Ambulatory Visit: Payer: Self-pay | Admitting: *Deleted

## 2015-12-06 DIAGNOSIS — C7931 Secondary malignant neoplasm of brain: Secondary | ICD-10-CM

## 2015-12-06 DIAGNOSIS — C7949 Secondary malignant neoplasm of other parts of nervous system: Principal | ICD-10-CM

## 2015-12-06 NOTE — Telephone Encounter (Signed)
1.  Do you need a wheel chair?  NO  2. On oxygen? NO  3. Have you ever had any surgery in the body part being scanned?  NO  4. Have you ever had any surgery on your brain or heart?       NO                5. Have you ever had surgery on your eyes or ears?     NO                         6. Do you have a pacemaker or defibrillator?  NO  7. Do you have a Neurostimulator?  NO  8. Claustrophobic?  YES  9. Any risk for metal in eyes? NO  10. Injury by bullet, buckshot, or shrapnel?  NO  11. Stent?  no                                                                                                                 12. Hx of Cancer? BREAST META TO BRAIN  13. Kidney or Liver disease?  NO  14. Hx of Lupus, Rheumatoid Arthritis or Scleroderma? NO  15. IV Antibiotics or long term use of NSAIDS?   NO  16. HX of Hypertension?  NO  17. Diabetes?  NO  18. Allergy to contrast?  NO

## 2015-12-21 ENCOUNTER — Other Ambulatory Visit: Payer: Self-pay | Admitting: *Deleted

## 2015-12-21 DIAGNOSIS — C50911 Malignant neoplasm of unspecified site of right female breast: Secondary | ICD-10-CM

## 2015-12-21 MED ORDER — ANASTROZOLE 1 MG PO TABS: 1.0000 mg | ORAL_TABLET | Freq: Every day | ORAL | Status: DC

## 2015-12-21 MED FILL — ANASTROZOLE 1 MG TABLET: 1 | 30 days supply | Qty: 30 | Fill #0

## 2015-12-25 ENCOUNTER — Other Ambulatory Visit (HOSPITAL_BASED_OUTPATIENT_CLINIC_OR_DEPARTMENT_OTHER): Payer: Medicare Other

## 2015-12-25 ENCOUNTER — Ambulatory Visit (HOSPITAL_BASED_OUTPATIENT_CLINIC_OR_DEPARTMENT_OTHER): Payer: Medicare Other

## 2015-12-25 VITALS — BP 126/61 | HR 81 | Temp 98.3°F | Resp 18

## 2015-12-25 DIAGNOSIS — J910 Malignant pleural effusion: Secondary | ICD-10-CM | POA: Diagnosis not present

## 2015-12-25 DIAGNOSIS — C50811 Malignant neoplasm of overlapping sites of right female breast: Secondary | ICD-10-CM

## 2015-12-25 DIAGNOSIS — C50812 Malignant neoplasm of overlapping sites of left female breast: Secondary | ICD-10-CM

## 2015-12-25 DIAGNOSIS — C7931 Secondary malignant neoplasm of brain: Secondary | ICD-10-CM

## 2015-12-25 DIAGNOSIS — C7951 Secondary malignant neoplasm of bone: Secondary | ICD-10-CM | POA: Diagnosis not present

## 2015-12-25 DIAGNOSIS — C50919 Malignant neoplasm of unspecified site of unspecified female breast: Secondary | ICD-10-CM

## 2015-12-25 DIAGNOSIS — C50512 Malignant neoplasm of lower-outer quadrant of left female breast: Secondary | ICD-10-CM | POA: Diagnosis not present

## 2015-12-25 DIAGNOSIS — C50511 Malignant neoplasm of lower-outer quadrant of right female breast: Secondary | ICD-10-CM

## 2015-12-25 DIAGNOSIS — Z5112 Encounter for antineoplastic immunotherapy: Secondary | ICD-10-CM

## 2015-12-25 DIAGNOSIS — C7800 Secondary malignant neoplasm of unspecified lung: Secondary | ICD-10-CM

## 2015-12-25 DIAGNOSIS — C50912 Malignant neoplasm of unspecified site of left female breast: Principal | ICD-10-CM

## 2015-12-25 DIAGNOSIS — C50911 Malignant neoplasm of unspecified site of right female breast: Secondary | ICD-10-CM

## 2015-12-25 LAB — COMPREHENSIVE METABOLIC PANEL
ALBUMIN: 3.1 g/dL — AB (ref 3.5–5.0)
ALK PHOS: 103 U/L (ref 40–150)
ALT: 31 U/L (ref 0–55)
AST: 46 U/L — ABNORMAL HIGH (ref 5–34)
Anion Gap: 9 mEq/L (ref 3–11)
BUN: 9.6 mg/dL (ref 7.0–26.0)
CO2: 25 mEq/L (ref 22–29)
Calcium: 9.4 mg/dL (ref 8.4–10.4)
Chloride: 107 mEq/L (ref 98–109)
Creatinine: 0.8 mg/dL (ref 0.6–1.1)
EGFR: 81 mL/min/{1.73_m2} — ABNORMAL LOW (ref >90)
GLUCOSE: 153 mg/dL — AB (ref 70–140)
POTASSIUM: 3.6 meq/L (ref 3.5–5.1)
SODIUM: 141 meq/L (ref 136–145)
TOTAL PROTEIN: 7.7 g/dL (ref 6.4–8.3)
Total Bilirubin: 0.83 mg/dL (ref 0.20–1.20)

## 2015-12-25 LAB — CBC WITH DIFFERENTIAL/PLATELET
BASO%: 0.2 % (ref 0.0–2.0)
Basophils Absolute: 0 10*3/uL (ref 0.0–0.1)
EOS ABS: 0.1 10*3/uL (ref 0.0–0.5)
EOS%: 2.5 % (ref 0.0–7.0)
HCT: 37.5 % (ref 34.8–46.6)
HEMOGLOBIN: 12.5 g/dL (ref 11.6–15.9)
LYMPH%: 30 % (ref 14.0–49.7)
MCH: 31.5 pg (ref 25.1–34.0)
MCHC: 33.3 g/dL (ref 31.5–36.0)
MCV: 94.5 fL (ref 79.5–101.0)
MONO#: 0.6 10*3/uL (ref 0.1–0.9)
MONO%: 12.3 % (ref 0.0–14.0)
NEUT%: 55 % (ref 38.4–76.8)
NEUTROS ABS: 2.5 10*3/uL (ref 1.5–6.5)
Platelets: 128 10*3/uL — ABNORMAL LOW (ref 145–400)
RBC: 3.97 10*6/uL (ref 3.70–5.45)
RDW: 15.1 % — AB (ref 11.2–14.5)
WBC: 4.5 10*3/uL (ref 3.9–10.3)
lymph#: 1.3 10*3/uL (ref 0.9–3.3)

## 2015-12-25 MED ORDER — SODIUM CHLORIDE 0.9 % IV SOLN
3.5000 mg/kg | Freq: Once | INTRAVENOUS | Status: AC
  Administered 2015-12-25: 360 mg via INTRAVENOUS
  Filled 2015-12-25: qty 10

## 2015-12-25 MED ORDER — DIPHENHYDRAMINE HCL 25 MG PO CAPS
25.0000 mg | ORAL_CAPSULE | Freq: Once | ORAL | Status: AC
  Administered 2015-12-25: 25 mg via ORAL

## 2015-12-25 MED ORDER — ACETAMINOPHEN 325 MG PO TABS
ORAL_TABLET | ORAL | Status: AC
  Filled 2015-12-25: qty 2

## 2015-12-25 MED ORDER — SODIUM CHLORIDE 0.9 % IV SOLN
Freq: Once | INTRAVENOUS | Status: AC
  Administered 2015-12-25: 11:00:00 via INTRAVENOUS

## 2015-12-25 MED ORDER — DIPHENHYDRAMINE HCL 25 MG PO CAPS
ORAL_CAPSULE | ORAL | Status: AC
  Filled 2015-12-25: qty 1

## 2015-12-25 MED ORDER — SODIUM CHLORIDE 0.9 % IJ SOLN
10.0000 mL | INTRAMUSCULAR | Status: DC | PRN
  Administered 2015-12-25: 10 mL
  Filled 2015-12-25: qty 10

## 2015-12-25 MED ORDER — ACETAMINOPHEN 325 MG PO TABS
650.0000 mg | ORAL_TABLET | Freq: Once | ORAL | Status: AC
  Administered 2015-12-25: 650 mg via ORAL

## 2015-12-25 MED ORDER — HEPARIN SOD (PORK) LOCK FLUSH 100 UNIT/ML IV SOLN
500.0000 [IU] | Freq: Once | INTRAVENOUS | Status: AC | PRN
  Administered 2015-12-25: 500 [IU]
  Filled 2015-12-25: qty 5

## 2015-12-25 NOTE — Progress Notes (Signed)
Patient refused to wait the 30 minute observation period post kadcyla.

## 2015-12-25 NOTE — Patient Instructions (Signed)
[facility] Discharge Instructions for Patients Receiving Chemotherapy  Today you received the following chemotherapy agents:  Kadcyla  To help prevent nausea and vomiting after your treatment, we encourage you to take your nausea medication as prescribed.   If you develop nausea and vomiting that is not controlled by your nausea medication, call the clinic.   BELOW ARE SYMPTOMS THAT SHOULD BE REPORTED IMMEDIATELY:  *FEVER GREATER THAN 100.5 F  *CHILLS WITH OR WITHOUT FEVER  NAUSEA AND VOMITING THAT IS NOT CONTROLLED WITH YOUR NAUSEA MEDICATION  *UNUSUAL SHORTNESS OF BREATH  *UNUSUAL BRUISING OR BLEEDING  TENDERNESS IN MOUTH AND THROAT WITH OR WITHOUT PRESENCE OF ULCERS  *URINARY PROBLEMS  *BOWEL PROBLEMS  UNUSUAL RASH Items with * indicate a potential emergency and should be followed up as soon as possible.  Feel free to call the clinic you have any questions or concerns. The clinic phone number is [phone].  Please show the CHEMO ALERT CARD at check-in to the Emergency Department and triage nurse.   

## 2016-01-15 ENCOUNTER — Ambulatory Visit (HOSPITAL_BASED_OUTPATIENT_CLINIC_OR_DEPARTMENT_OTHER): Payer: Medicare Other

## 2016-01-15 ENCOUNTER — Other Ambulatory Visit (HOSPITAL_BASED_OUTPATIENT_CLINIC_OR_DEPARTMENT_OTHER): Payer: Medicare Other

## 2016-01-15 VITALS — BP 143/70 | HR 76 | Temp 96.7°F | Resp 18

## 2016-01-15 DIAGNOSIS — C50811 Malignant neoplasm of overlapping sites of right female breast: Secondary | ICD-10-CM

## 2016-01-15 DIAGNOSIS — C50511 Malignant neoplasm of lower-outer quadrant of right female breast: Secondary | ICD-10-CM

## 2016-01-15 DIAGNOSIS — J910 Malignant pleural effusion: Secondary | ICD-10-CM

## 2016-01-15 DIAGNOSIS — C50512 Malignant neoplasm of lower-outer quadrant of left female breast: Secondary | ICD-10-CM | POA: Diagnosis not present

## 2016-01-15 DIAGNOSIS — Z5112 Encounter for antineoplastic immunotherapy: Secondary | ICD-10-CM

## 2016-01-15 DIAGNOSIS — C50919 Malignant neoplasm of unspecified site of unspecified female breast: Secondary | ICD-10-CM

## 2016-01-15 DIAGNOSIS — C7931 Secondary malignant neoplasm of brain: Secondary | ICD-10-CM | POA: Diagnosis not present

## 2016-01-15 DIAGNOSIS — C7951 Secondary malignant neoplasm of bone: Secondary | ICD-10-CM | POA: Diagnosis not present

## 2016-01-15 DIAGNOSIS — C50912 Malignant neoplasm of unspecified site of left female breast: Principal | ICD-10-CM

## 2016-01-15 DIAGNOSIS — C50812 Malignant neoplasm of overlapping sites of left female breast: Secondary | ICD-10-CM

## 2016-01-15 DIAGNOSIS — C50911 Malignant neoplasm of unspecified site of right female breast: Secondary | ICD-10-CM

## 2016-01-15 DIAGNOSIS — C7800 Secondary malignant neoplasm of unspecified lung: Secondary | ICD-10-CM

## 2016-01-15 LAB — CBC WITH DIFFERENTIAL/PLATELET
BASO%: 0.6 % (ref 0.0–2.0)
BASOS ABS: 0 10*3/uL (ref 0.0–0.1)
EOS ABS: 0.1 10*3/uL (ref 0.0–0.5)
EOS%: 2.9 % (ref 0.0–7.0)
HEMATOCRIT: 37.6 % (ref 34.8–46.6)
HEMOGLOBIN: 12.5 g/dL (ref 11.6–15.9)
LYMPH#: 1.2 10*3/uL (ref 0.9–3.3)
LYMPH%: 27.7 % (ref 14.0–49.7)
MCH: 31.2 pg (ref 25.1–34.0)
MCHC: 33.2 g/dL (ref 31.5–36.0)
MCV: 94.2 fL (ref 79.5–101.0)
MONO#: 0.7 10*3/uL (ref 0.1–0.9)
MONO%: 14.5 % — AB (ref 0.0–14.0)
NEUT#: 2.4 10*3/uL (ref 1.5–6.5)
NEUT%: 54.3 % (ref 38.4–76.8)
PLATELETS: 136 10*3/uL — AB (ref 145–400)
RBC: 3.99 10*6/uL (ref 3.70–5.45)
RDW: 15.6 % — AB (ref 11.2–14.5)
WBC: 4.5 10*3/uL (ref 3.9–10.3)

## 2016-01-15 LAB — COMPREHENSIVE METABOLIC PANEL
ALBUMIN: 3.1 g/dL — AB (ref 3.5–5.0)
ALK PHOS: 147 U/L (ref 40–150)
ALT: 40 U/L (ref 0–55)
ANION GAP: 7 meq/L (ref 3–11)
AST: 50 U/L — ABNORMAL HIGH (ref 5–34)
BILIRUBIN TOTAL: 0.73 mg/dL (ref 0.20–1.20)
BUN: 10.9 mg/dL (ref 7.0–26.0)
CALCIUM: 9.7 mg/dL (ref 8.4–10.4)
CO2: 28 mEq/L (ref 22–29)
Chloride: 107 mEq/L (ref 98–109)
Creatinine: 0.7 mg/dL (ref 0.6–1.1)
EGFR: 85 mL/min/{1.73_m2} — AB (ref >90)
Glucose: 125 mg/dl (ref 70–140)
POTASSIUM: 3.7 meq/L (ref 3.5–5.1)
Sodium: 142 mEq/L (ref 136–145)
Total Protein: 7.7 g/dL (ref 6.4–8.3)

## 2016-01-15 MED ORDER — DIPHENHYDRAMINE HCL 25 MG PO CAPS
ORAL_CAPSULE | ORAL | Status: AC
Start: 2016-01-15 — End: 2016-01-15
  Filled 2016-01-15: qty 1

## 2016-01-15 MED ORDER — SODIUM CHLORIDE 0.9 % IJ SOLN
10.0000 mL | INTRAMUSCULAR | Status: DC | PRN
  Administered 2016-01-15: 10 mL
  Filled 2016-01-15: qty 10

## 2016-01-15 MED ORDER — SODIUM CHLORIDE 0.9 % IV SOLN
Freq: Once | INTRAVENOUS | Status: AC
Start: 2016-01-15 — End: 2016-01-15
  Administered 2016-01-15: 12:00:00 via INTRAVENOUS

## 2016-01-15 MED ORDER — DIPHENHYDRAMINE HCL 25 MG PO CAPS
25.0000 mg | ORAL_CAPSULE | Freq: Once | ORAL | Status: AC
  Administered 2016-01-15: 25 mg via ORAL

## 2016-01-15 MED ORDER — HEPARIN SOD (PORK) LOCK FLUSH 100 UNIT/ML IV SOLN
500.0000 [IU] | Freq: Once | INTRAVENOUS | Status: AC | PRN
  Administered 2016-01-15: 500 [IU]
  Filled 2016-01-15: qty 5

## 2016-01-15 MED ORDER — SODIUM CHLORIDE 0.9 % IV SOLN
3.5000 mg/kg | Freq: Once | INTRAVENOUS | Status: AC
  Administered 2016-01-15: 360 mg via INTRAVENOUS
  Filled 2016-01-15: qty 10

## 2016-01-15 MED ORDER — ACETAMINOPHEN 325 MG PO TABS
ORAL_TABLET | ORAL | Status: AC
  Filled 2016-01-15: qty 2

## 2016-01-15 MED ORDER — ACETAMINOPHEN 325 MG PO TABS
650.0000 mg | ORAL_TABLET | Freq: Once | ORAL | Status: AC
  Administered 2016-01-15: 650 mg via ORAL

## 2016-01-15 NOTE — Patient Instructions (Signed)
[facility] Discharge Instructions for Patients Receiving Chemotherapy  Today you received the following chemotherapy agents:  Kadcyla  To help prevent nausea and vomiting after your treatment, we encourage you to take your nausea medication as prescribed.   If you develop nausea and vomiting that is not controlled by your nausea medication, call the clinic.   BELOW ARE SYMPTOMS THAT SHOULD BE REPORTED IMMEDIATELY:  *FEVER GREATER THAN 100.5 F  *CHILLS WITH OR WITHOUT FEVER  NAUSEA AND VOMITING THAT IS NOT CONTROLLED WITH YOUR NAUSEA MEDICATION  *UNUSUAL SHORTNESS OF BREATH  *UNUSUAL BRUISING OR BLEEDING  TENDERNESS IN MOUTH AND THROAT WITH OR WITHOUT PRESENCE OF ULCERS  *URINARY PROBLEMS  *BOWEL PROBLEMS  UNUSUAL RASH Items with * indicate a potential emergency and should be followed up as soon as possible.  Feel free to call the clinic you have any questions or concerns. The clinic phone number is [phone].  Please show the CHEMO ALERT CARD at check-in to the Emergency Department and triage nurse.   

## 2016-01-21 MED FILL — ANASTROZOLE 1 MG TABLET: 1 | 30 days supply | Qty: 30 | Fill #1

## 2016-02-04 ENCOUNTER — Telehealth: Payer: Self-pay | Admitting: *Deleted

## 2016-02-04 DIAGNOSIS — C50912 Malignant neoplasm of unspecified site of left female breast: Secondary | ICD-10-CM

## 2016-02-04 DIAGNOSIS — Z79899 Other long term (current) drug therapy: Secondary | ICD-10-CM

## 2016-02-04 DIAGNOSIS — Z5181 Encounter for therapeutic drug level monitoring: Secondary | ICD-10-CM

## 2016-02-04 MED FILL — FUROSEMIDE 20 MG TABLET: 20 | 84 days supply | Qty: 24 | Fill #2

## 2016-02-04 NOTE — Telephone Encounter (Signed)
Received notice from pharmacist/[facility] that pt is passed due for ECHO & has appt for MD & RX tomorrow.  Called [facility] & they cannot see order.  Will reorder & pt was scheduled for this thurs 02/07/16 @ 10 am.  Pt will be notified by this RN.

## 2016-02-05 ENCOUNTER — Ambulatory Visit (HOSPITAL_BASED_OUTPATIENT_CLINIC_OR_DEPARTMENT_OTHER): Payer: Medicare Other

## 2016-02-05 ENCOUNTER — Other Ambulatory Visit (HOSPITAL_BASED_OUTPATIENT_CLINIC_OR_DEPARTMENT_OTHER): Payer: Medicare Other

## 2016-02-05 ENCOUNTER — Ambulatory Visit (HOSPITAL_BASED_OUTPATIENT_CLINIC_OR_DEPARTMENT_OTHER): Payer: Medicare Other | Admitting: Oncology

## 2016-02-05 ENCOUNTER — Telehealth: Payer: Self-pay | Admitting: Oncology

## 2016-02-05 VITALS — BP 132/69 | HR 79 | Temp 98.7°F | Resp 18 | Ht 69.0 in | Wt 214.2 lb

## 2016-02-05 DIAGNOSIS — C7931 Secondary malignant neoplasm of brain: Secondary | ICD-10-CM

## 2016-02-05 DIAGNOSIS — J910 Malignant pleural effusion: Secondary | ICD-10-CM

## 2016-02-05 DIAGNOSIS — C50812 Malignant neoplasm of overlapping sites of left female breast: Secondary | ICD-10-CM

## 2016-02-05 DIAGNOSIS — C50911 Malignant neoplasm of unspecified site of right female breast: Secondary | ICD-10-CM

## 2016-02-05 DIAGNOSIS — Z5111 Encounter for antineoplastic chemotherapy: Secondary | ICD-10-CM

## 2016-02-05 DIAGNOSIS — C50912 Malignant neoplasm of unspecified site of left female breast: Secondary | ICD-10-CM

## 2016-02-05 DIAGNOSIS — C7951 Secondary malignant neoplasm of bone: Secondary | ICD-10-CM

## 2016-02-05 DIAGNOSIS — Z170 Estrogen receptor positive status [ER+]: Secondary | ICD-10-CM

## 2016-02-05 DIAGNOSIS — C50919 Malignant neoplasm of unspecified site of unspecified female breast: Secondary | ICD-10-CM

## 2016-02-05 DIAGNOSIS — Z86711 Personal history of pulmonary embolism: Secondary | ICD-10-CM

## 2016-02-05 DIAGNOSIS — C7800 Secondary malignant neoplasm of unspecified lung: Principal | ICD-10-CM

## 2016-02-05 DIAGNOSIS — C50811 Malignant neoplasm of overlapping sites of right female breast: Secondary | ICD-10-CM

## 2016-02-05 LAB — CBC WITH DIFFERENTIAL/PLATELET
BASO%: 0.3 % (ref 0.0–2.0)
Basophils Absolute: 0 10*3/uL (ref 0.0–0.1)
EOS ABS: 0.1 10*3/uL (ref 0.0–0.5)
EOS%: 2.5 % (ref 0.0–7.0)
HCT: 37.3 % (ref 34.8–46.6)
HEMOGLOBIN: 12.5 g/dL (ref 11.6–15.9)
LYMPH%: 30.2 % (ref 14.0–49.7)
MCH: 31.3 pg (ref 25.1–34.0)
MCHC: 33.5 g/dL (ref 31.5–36.0)
MCV: 93.6 fL (ref 79.5–101.0)
MONO#: 0.6 10*3/uL (ref 0.1–0.9)
MONO%: 12.9 % (ref 0.0–14.0)
NEUT%: 54.1 % (ref 38.4–76.8)
NEUTROS ABS: 2.5 10*3/uL (ref 1.5–6.5)
Platelets: 144 10*3/uL — ABNORMAL LOW (ref 145–400)
RBC: 3.99 10*6/uL (ref 3.70–5.45)
RDW: 16.4 % — AB (ref 11.2–14.5)
WBC: 4.6 10*3/uL (ref 3.9–10.3)
lymph#: 1.4 10*3/uL (ref 0.9–3.3)

## 2016-02-05 LAB — COMPREHENSIVE METABOLIC PANEL
ALBUMIN: 3.1 g/dL — AB (ref 3.5–5.0)
ALK PHOS: 128 U/L (ref 40–150)
ALT: 34 U/L (ref 0–55)
AST: 48 U/L — AB (ref 5–34)
Anion Gap: 8 mEq/L (ref 3–11)
BILIRUBIN TOTAL: 0.85 mg/dL (ref 0.20–1.20)
BUN: 19.5 mg/dL (ref 7.0–26.0)
CO2: 26 meq/L (ref 22–29)
CREATININE: 0.8 mg/dL (ref 0.6–1.1)
Calcium: 9.6 mg/dL (ref 8.4–10.4)
Chloride: 105 mEq/L (ref 98–109)
EGFR: 76 mL/min/{1.73_m2} — AB (ref >90)
GLUCOSE: 151 mg/dL — AB (ref 70–140)
Potassium: 3.7 mEq/L (ref 3.5–5.1)
SODIUM: 140 meq/L (ref 136–145)
TOTAL PROTEIN: 7.8 g/dL (ref 6.4–8.3)

## 2016-02-05 MED ORDER — SODIUM CHLORIDE 0.9 % IV SOLN
3.5000 mg/kg | Freq: Once | INTRAVENOUS | Status: AC
  Administered 2016-02-05: 360 mg via INTRAVENOUS
  Filled 2016-02-05: qty 10

## 2016-02-05 MED ORDER — HEPARIN SOD (PORK) LOCK FLUSH 100 UNIT/ML IV SOLN
500.0000 [IU] | Freq: Once | INTRAVENOUS | Status: AC | PRN
  Administered 2016-02-05: 500 [IU]
  Filled 2016-02-05: qty 5

## 2016-02-05 MED ORDER — SODIUM CHLORIDE 0.9 % IV SOLN
Freq: Once | INTRAVENOUS | Status: AC
  Administered 2016-02-05: 12:00:00 via INTRAVENOUS

## 2016-02-05 MED ORDER — ACETAMINOPHEN 325 MG PO TABS
650.0000 mg | ORAL_TABLET | Freq: Once | ORAL | Status: AC
  Administered 2016-02-05: 650 mg via ORAL

## 2016-02-05 MED ORDER — DIPHENHYDRAMINE HCL 25 MG PO CAPS
25.0000 mg | ORAL_CAPSULE | Freq: Once | ORAL | Status: AC
  Administered 2016-02-05: 25 mg via ORAL

## 2016-02-05 MED ORDER — ZOLEDRONIC ACID 4 MG/5ML IV CONC
4.0000 mg | Freq: Once | INTRAVENOUS | Status: AC
  Administered 2016-02-05: 4 mg via INTRAVENOUS
  Filled 2016-02-05: qty 5

## 2016-02-05 MED ORDER — SODIUM CHLORIDE 0.9 % IJ SOLN
10.0000 mL | INTRAMUSCULAR | Status: DC | PRN
  Administered 2016-02-05: 10 mL
  Filled 2016-02-05: qty 10

## 2016-02-05 MED ORDER — ACETAMINOPHEN 325 MG PO TABS
ORAL_TABLET | ORAL | Status: AC
  Filled 2016-02-05: qty 2

## 2016-02-05 MED ORDER — DIPHENHYDRAMINE HCL 25 MG PO CAPS
ORAL_CAPSULE | ORAL | Status: AC
  Filled 2016-02-05: qty 1

## 2016-02-05 NOTE — Progress Notes (Signed)
Per Val, RN, Dr. Jana Hakim ok with administering Zometa today.

## 2016-02-05 NOTE — Patient Instructions (Addendum)
[facility] Discharge Instructions for Patients Receiving Chemotherapy  Today you received the following chemotherapy agents:  Kadcyla.  To help prevent nausea and vomiting after your treatment, we encourage you to take your nausea medication as directed.   If you develop nausea and vomiting that is not controlled by your nausea medication, call the clinic.   BELOW ARE SYMPTOMS THAT SHOULD BE REPORTED IMMEDIATELY:  *FEVER GREATER THAN 100.5 F  *CHILLS WITH OR WITHOUT FEVER  NAUSEA AND VOMITING THAT IS NOT CONTROLLED WITH YOUR NAUSEA MEDICATION  *UNUSUAL SHORTNESS OF BREATH  *UNUSUAL BRUISING OR BLEEDING  TENDERNESS IN MOUTH AND THROAT WITH OR WITHOUT PRESENCE OF ULCERS  *URINARY PROBLEMS  *BOWEL PROBLEMS  UNUSUAL RASH Items with * indicate a potential emergency and should be followed up as soon as possible.  Feel free to call the clinic you have any questions or concerns. The clinic phone number is [phone].  Please show the [place] at check-in to the Emergency Department and triage nurse.  Zoledronic Acid injection (Hypercalcemia, Oncology) What is this medicine? ZOLEDRONIC ACID (ZOE le dron ik AS id) lowers the amount of calcium loss from bone. It is used to treat too much calcium in your blood from cancer. It is also used to prevent complications of cancer that has spread to the bone. This medicine may be used for other purposes; ask your health care provider or pharmacist if you have questions. What should I tell my health care provider before I take this medicine? They need to know if you have any of these conditions: -aspirin-sensitive asthma -cancer, especially if you are receiving medicines used to treat cancer -dental disease or wear dentures -infection -kidney disease -receiving corticosteroids like dexamethasone or prednisone -an unusual or allergic reaction to zoledronic acid, other medicines, foods, dyes, or  preservatives -pregnant or trying to get pregnant -breast-feeding How should I use this medicine? This medicine is for infusion into a vein. It is given by a health care professional in a hospital or clinic setting. Talk to your pediatrician regarding the use of this medicine in children. Special care may be needed. Overdosage: If you think you have taken too much of this medicine contact a poison control center or emergency room at once. NOTE: This medicine is only for you. Do not share this medicine with others. What if I miss a dose? It is important not to miss your dose. Call your doctor or health care professional if you are unable to keep an appointment. What may interact with this medicine? -certain antibiotics given by injection -NSAIDs, medicines for pain and inflammation, like ibuprofen or naproxen -some diuretics like bumetanide, furosemide -teriparatide -thalidomide This list may not describe all possible interactions. Give your health care provider a list of all the medicines, herbs, non-prescription drugs, or dietary supplements you use. Also tell them if you smoke, drink alcohol, or use illegal drugs. Some items may interact with your medicine. What should I watch for while using this medicine? Visit your doctor or health care professional for regular checkups. It may be some time before you see the benefit from this medicine. Do not stop taking your medicine unless your doctor tells you to. Your doctor may order blood tests or other tests to see how you are doing. Women should inform their doctor if they wish to become pregnant or think they might be pregnant. There is a potential for serious side effects to an unborn child. Talk to your health care professional  or pharmacist for more information. You should make sure that you get enough calcium and vitamin D while you are taking this medicine. Discuss the foods you eat and the vitamins you take with your health care  professional. Some people who take this medicine have severe bone, joint, and/or muscle pain. This medicine may also increase your risk for jaw problems or a broken thigh bone. Tell your doctor right away if you have severe pain in your jaw, bones, joints, or muscles. Tell your doctor if you have any pain that does not go away or that gets worse. Tell your dentist and dental surgeon that you are taking this medicine. You should not have major dental surgery while on this medicine. See your dentist to have a dental exam and fix any dental problems before starting this medicine. Take good care of your teeth while on this medicine. Make sure you see your dentist for regular follow-up appointments. What side effects may I notice from receiving this medicine? Side effects that you should report to your doctor or health care professional as soon as possible: -allergic reactions like skin rash, itching or hives, swelling of the face, lips, or tongue -anxiety, confusion, or depression -breathing problems -changes in vision -eye pain -feeling faint or lightheaded, falls -jaw pain, especially after dental work -mouth sores -muscle cramps, stiffness, or weakness -redness, blistering, peeling or loosening of the skin, including inside the mouth -trouble passing urine or change in the amount of urine Side effects that usually do not require medical attention (report to your doctor or health care professional if they continue or are bothersome): -bone, joint, or muscle pain -constipation -diarrhea -fever -hair loss -irritation at site where injected -loss of appetite -nausea, vomiting -stomach upset -trouble sleeping -trouble swallowing -weak or tired This list may not describe all possible side effects. Call your doctor for medical advice about side effects. You may report side effects to FDA at 1-800-FDA-1088. Where should I keep my medicine? This drug is given in a hospital or clinic and will not  be stored at home. NOTE: This sheet is a summary. It may not cover all possible information. If you have questions about this medicine, talk to your doctor, pharmacist, or health care provider.    2016, Elsevier/Gold Standard. (2013-10-29 14:19:39)

## 2016-02-05 NOTE — Progress Notes (Signed)
ID: Linda [name]   DOB: [birth year]  MR#: [id number]  XVQ#:[id number]  PCP: Leeanne Rio, PA-C GYN: SU: Coralie Keens OTHER MD: Crissie Reese, Linna Hoff Bensimhon  CHIEF COMPLAINT:  Bilateral Breast Cancers  CURRENT TREATMENT: Anastrozole, T-DM 1, zolendrnate  BREAST CANCER HISTORY: From the original intake note:  The patient noted a small amount of drainage from her left breast December of 2012. She brought it to her gynecologist's attention in January of 2013 and was set up for bilateral diagnostic mammography at the breast Center July 24, 2011. This was the patient's first ever mammogram. It showed a large irregular mass in the left retroareolar region extending to the nipple, with nipple retraction and skin thickening. This measured approximately 8.4 cm. It was associated with pleomorphic microcalcifications. By exam there was moderate distortion and retraction of the nipple with a large palpable ill-defined area in the retroareolar region. In the right right breast there was also a 2 cm hard mass palpated. Ultrasound of the right breast mass showed a complex cystic/solid area measuring 1.9 cm. Ultrasound of the right axilla was negative. Ultrasound of the left breast showed a large hypoechoic mass measuring at least 3.8 cm. The left axilla showed several adjacent abnormal appearing lymph nodes.  With this information biopsy of the right and left breast masses were obtained the same day, and showed ([id number])   (a) on the right, and invasive ductal carcinoma with papillary features, estrogen and progesterone receptor positive, HER-2 negative, with an MIB-1 of 10%.   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 The MIB-1 was 60% for the left-sided tumor.   With this information the patient was presented at the multidisciplinary breast cancer conference 08/06/2011. Subsequent  evaluation and treatments are as detailed below.  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 She is being treated with T-DM 1, which so far she tolerates well. Her most recent echocardiogram was in April. Repeat is due this week. She is also on zolendronate, again with good tolerance. We have instructed her to take some Tums on the day of zolendronate treatment.  REVIEW OF SYSTEMS: Linda [name] is getting her home ready to be sold so she can move in with one of her sons and his family. She feels she can be helpful to them particular since they have a new baby. This is as much exercise as she is able to manage at this point. She is taking a course on HR block so she can continue to work on [employer]. Aside from these issues a detailed review of systems today was stable  PAST MEDICAL HISTORY: Past Medical History:  Diagnosis Date  . Acute on chronic diastolic CHF (congestive heart failure) ([place])   . Acute pulmonary embolism ([place]) 12/13/2014  . Allergy   . Blood transfusion without reported diagnosis   . Breast cancer ([place]) 07/30/11 dx   Right  invasive ductal ca 7 0'clock,& left breast=invasive ductal ca  and dcis, left axilla nlymph node, metastatic ca  . Bronchitis    hx  . Cancer ([place]) 08-13-11   07-31-11-Dx. Bilarteral Breast cancer-left greater than rt.  . Cancer of central portion of left female breast ([place]) 08/01/2011  . GERD (gastroesophageal reflux disease)    doing well  . Hematuria, undiagnosed cause 08-13-11   Being evaluated by [facility] 08-14-11  . History of  radiation therapy 02/20/12-04/15/12   left breast,total 60.4 Gy  . Hypertension   . Radiation-induced dermatitis 03/26/2012   Using radioplex cream, plus neosporin.   . S/P radiation therapy 10/09/14-10/27/14   whole brain 37.5Gy/34f  . Seasonal allergies   . Seroma 02/04/12   right breast  200cc removed  erythema on right side  . Wears partial dentures     wears upper and lower partial    PAST SURGICAL HISTORY: Past Surgical History:  Procedure Laterality Date  . BREAST SURGERY    . CHEST TUBE INSERTION Left 12/14/2014   Procedure: CHEST TUBE INSERTION;  Surgeon: CRexene Alberts MD;  [facility]  Service: Thoracic;  Laterality: Left;  . child birth  08-13-11   x3 -NVD  . MASTECTOMY W/ SENTINEL NODE BIOPSY  01/21/2012   Procedure: MASTECTOMY WITH SENTINEL LYMPH NODE BIOPSY;  Surgeon: DHarl Bowie MD;  [facility]  Service: General;  Laterality: Bilateral;  Left modified radical mastectomy, Rt mastectomy with Sentinel lymphnode biospy  . PORT-A-CATH REMOVAL Right 12/22/2012   Procedure: REMOVAL PORT-A-CATH;  Surgeon: DHarl Bowie MD;  [facility] [facility]  Service: General;  Laterality: Right;  . PORTACATH PLACEMENT  08/15/2011   Procedure: INSERTION PORT-A-CATH;  Surgeon: DHarl Bowie MD;  [facility];  Service: General;  Laterality: N/A;  . SUBXYPHOID PERICARDIAL WINDOW N/A 12/14/2014   Procedure: SUBXYPHOID PERICARDIAL WINDOW;  Surgeon: CRexene Alberts MD;  [facility]  Service: Thoracic;  Laterality: N/A;  . TEE WITHOUT CARDIOVERSION N/A 12/14/2014   Procedure: TRANSESOPHAGEAL ECHOCARDIOGRAM (TEE);  Surgeon: CRexene Alberts MD;  [facility];  Service: Thoracic;  Laterality: N/A;    FAMILY HISTORY Family History  Problem Relation Age of Onset  . Heart disease Mother   . Cancer Mother 451   breast, , 742deceased  . Heart attack Father   . Cancer Sister 660   breast  . Colon cancer Neg Hx   The patient's father died from a heart attack at the age of 875 The patient's mother died from apparently heart problems at the age of 81 The patient had no brothers. She has 3 sisters. One of her sisters was diagnosed with breast cancer in her mid 672s The patient does not know if his sister was ever genetically tested. The patient's mother had a mastectomy at the age of  334 presumably for breast cancer. There is no other history of breast or in cancer in the family to her knowledge.   GYNECOLOGIC HISTORY:  She does not recall when she had menarche. She had her first child at age 70 She is GX P3. She underwent menopause in her mid 434s She never took hormone replacement.   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 [place] a [place]Games developer[place] Son CGerald Stabslives [place] works as a fAirline pilot His wife is a nMarine scientist Son DShanon Browlives at WBed [employer]and is a dAdvertising account executivein addition to having the mTenne[employer] Inc The patient attends a local BLehman Brothershere   ADVANCED DIRECTIVES: Not in place.  Patient has beengiven the appropriate forms to complete and notarize at her discretion. She tells me she ihas named her daughter-in-law BJacqlyn Larsenas her healthcare power of attorney  HEALTH MAINTENANCE: Social History  Substance Use Topics  . Smoking status: Never Smoker  . Smokeless tobacco: Never Used  . Alcohol use 0.0 oz/week     Comment: rare- occ.     Colonoscopy: Never  PAP: Feb 2013  Bone density: November 2013, normal  Lipid panel:   Allergies  Allergen Reactions  . Ace Inhibitors Cough    Current Outpatient Prescriptions  Medication Sig Dispense Refill  . anastrozole (ARIMIDEX) 1 MG tablet Take 1 tablet (1 mg total) by mouth daily. 30 tablet 3  . b complex vitamins tablet Take 1 tablet by mouth daily.    . cholecalciferol (VITAMIN D) 1000 UNITS tablet Take 1 tablet (1,000 Units total) by mouth daily. 100 tablet 12  . docusate sodium (COLACE) 100 MG capsule Take 1 capsule (100 mg total) by mouth 2 (two) times daily. 10 capsule 0  . furosemide (LASIX) 20 MG tablet Take 1 tablet (20 mg total) by mouth 2 (two) times a week. 30 tablet 12  . magnesium oxide (MAG-OX) 400  (241.3 Mg) MG tablet Take 1 tablet (400 mg total) by mouth daily. 30 tablet 12  . Multiple Vitamins-Iron (MULTIVITAMINS WITH IRON) TABS Take 1 tablet by mouth daily.    . potassium chloride SA (K-DUR,KLOR-CON) 20 MEQ tablet Take 1 tablet (20 mEq total) by mouth daily. 30 tablet 12   No current facility-administered medications for this visit.     OBJECTIVE: [name] who Appears mildly fatigued Vitals:   02/05/16 1054  BP: 132/69  Pulse: 79  Resp: 18  Temp: 98.7 F (37.1 C)     Body mass index is 31.63 kg/m.    ECOG FS: 1 Filed Weights   02/05/16 1054  Weight: 214 lb 3.2 oz (97.2 kg)    Sclerae unicteric, EOMs intact Oropharynx clear and moist No cervical or supraclavicular adenopathy Lungs no rales or rhonchi Heart regular rate and rhythm Abd soft, nontender, positive bowel sounds MSK no focal spinal tenderness, no upper extremity lymphedema Neuro: nonfocal, well oriented, appropriate affect Breasts: Deferred   LAB RESULTS: CBC Latest Ref Rng & Units 02/05/2016 01/15/2016 12/25/2015  WBC 3.9 - 10.3 10e3/uL 4.6 4.5 4.5  Hemoglobin 11.6 - 15.9 g/dL 12.5 12.5 12.5  Hematocrit 34.8 - 46.6 % 37.3 37.6 37.5  Platelets 145 - 400 10e3/uL 144(L) 136(L) 128(L)   CMP Latest Ref Rng & Units 02/05/2016 01/15/2016 12/25/2015  Glucose 70 - 140 mg/dl 151(H) 125 153(H)  BUN 7.0 - 26.0 mg/dL 19.5 10.9 9.6  Creatinine 0.6 - 1.1 mg/dL 0.8 0.7 0.8  Sodium 136 - 145 mEq/L 140 142 141  Potassium 3.5 - 5.1 mEq/L 3.7 3.7 3.6  Chloride 101 - 111 mmol/L - - -  CO2 22 - 29 mEq/L 26 28 25   Calcium 8.4 - 10.4 mg/dL 9.6 9.7 9.4  Total Protein 6.4 - 8.3 g/dL 7.8 7.7 7.7  Total Bilirubin 0.20 - 1.20 mg/dL 0.85 0.73 0.83  Alkaline Phos 40 - 150 U/L 128 147 103  AST 5 - 34 U/L 48(H) 50(H) 46(H)  ALT 0 - 55 U/L 34 40 31    STUDIES: No results found.  ASSESSMENT: 70 y.o.  [place] woman   (1)  status post bilateral breast biopsies 07/24/2011, showing,      (a) on the right, a  clinical T2 N0 papillary/ductal breast cancer, estrogen and progesterone receptor positive, HER-2 negative, with an MIB-1 of 10%;     (b) on the left, a clinical T3 N1, stage  IIIA invasive ductal carcinoma, grade 3, triple positive, with an MIB-1 of 60%.  (2)  Status post 4 dose dense cycles of doxorubicin/ cyclophosphamide followed by 4 dose dense cycles of paclitaxel and trastuzumab completed 12/09/2011  (3) the trastuzumab was continued for a total of one year (to 11/01/2012). Final echo on 11/04/2012 showing a well preserved ejection fraction of 55-60%.  (4) s/p bilateral mastectomies 01/21/2012 showing   (a) on the Right, an 8 mm invasive papillary carcinoma, grade 1, ypT1b ypN0   (b) on the Left, multiple microscopic foci of residual  Invasive ductal carcinoma with evidence of dermal lymphatic involvement, pyT1a/T4 pyN0  (5)  Postmastectomy radiation, completed 04/15/2012  (6) Started anastrazole 04/16/2012; normal dexa scan 05/19/2014 at the [facility] 09/11/2014: brain, bones, left effusion (7) CT angiogram 09/11/2014 shows new left pericardial effusion and new mediastinal and hilar lymphadenopathy; there were no suspicious upper abdominal findings; Cytology from the left effusion 09/11/2014 ([id number]) shows malignant cells which are HER-2 positive  (8) Whole body bone scan on 09/25/14 showed metastatic foci throughout axial and appendicular skeleton. Areas of most potential concern include disease in the thoracic spine, disease in the right acetabulum, right superior pubic ramus and possibly proximal right femur, disease in the right humeral shaft and in both femoral shafts.  (8) Started trastuzumab/pertuzumab 09/26/2014, discontinued after 11/29/2014 dose, w progression  (a) T-DM1 started 01/02/2015  (b) echocardiogram 10/03/2015 shows an ejection fraction of 55-60%  (9) Brain MRI on 09/25/14 was positive for numerous small enhancing brain mets and  calvarium/bone mets   (a) whole brain radiation on 10/09/14--10/27/2014  (b) brain MRI 01/07/2015 shows a complete clinical response  (c) REPEAT BRAIN mri 03/18/2015 SHOWS NO NEW LESIONS  (d) repeat brain MRI with and without contrast 06/29/2015 continues to show no recurrent lesions in the brain  (e) brain MRI 10/12/2015 shows no evidence of recurrent brain metastases. She does have a large left mastoid effusion with trace right mastoid fluid.  (10) started zolendronate 11/29/2014, to be repeated every 12 weeks  11) left pleural effusion re-tapped 12/08/2014 (a) left chest tube placement 12/14/2014, removedd 12/21/2014  (12) large pericardial effusion per echo 12/13/2014  (a) pericardial window placement 12/14/2014; fluid is hemorrhagic; path negative  (13) Right-sided pulmonary emboli noted on CT scan 12/13/2014  (a) no pulmonary emboli demonstrated on non-dedicated chest CT 04/13/2015  PLAN: Linda [name] is now a year and a half out from diagnosis of metastatic breast cancer, with no evidence of active disease is very favorable.  She is tolerating the T-DM 1 with no side effects that she is aware of, and her most recent echo April 2017 showed a well-preserved ejection fraction. She is due for a repeat echocardiogram later this week.  She is also on anastrozole. Her bone density December 2015 was normal.  She will have a repeat brain MRI next week. She will see Dr. Tammi Klippel shortly thereafter to review results. She is [place] in the again in October. She will have a restaging CT scan of the chest shortly before that visit  She knows to call for any problems that may develop before then. Chauncey Cruel, MD

## 2016-02-05 NOTE — Telephone Encounter (Signed)
appt made and avs printed °

## 2016-02-06 ENCOUNTER — Other Ambulatory Visit: Payer: Self-pay

## 2016-02-07 ENCOUNTER — Other Ambulatory Visit: Payer: Self-pay | Admitting: Oncology

## 2016-02-07 ENCOUNTER — Ambulatory Visit (HOSPITAL_COMMUNITY)
Admission: RE | Admit: 2016-02-07 | Discharge: 2016-02-07 | Disposition: A | Discharge status: Home / Self Care | Payer: Medicare Other | Source: Ambulatory Visit | Attending: Oncology | Admitting: Oncology

## 2016-02-07 DIAGNOSIS — C50912 Malignant neoplasm of unspecified site of left female breast: Secondary | ICD-10-CM | POA: Insufficient documentation

## 2016-02-07 DIAGNOSIS — Z0189 Encounter for other specified special examinations: Secondary | ICD-10-CM | POA: Diagnosis not present

## 2016-02-07 DIAGNOSIS — Z5181 Encounter for therapeutic drug level monitoring: Secondary | ICD-10-CM | POA: Diagnosis present

## 2016-02-07 DIAGNOSIS — Z79899 Other long term (current) drug therapy: Secondary | ICD-10-CM | POA: Diagnosis present

## 2016-02-07 LAB — ECHOCARDIOGRAM LIMITED
AVLVOTPG: 7 mmHg
CHL CUP DOP CALC LVOT VTI: 26 cm
E decel time: 176 msec
EERAT: 8.54
FS: 23 % — AB (ref 28–44)
IVS/LV PW RATIO, ED: 1.03
LA diam index: 1.92 cm/m2
LASIZE: 41 mm
LAVOL: 48.5 mL
LAVOLA4C: 43.3 mL
LAVOLIN: 22.8 mL/m2
LEFT ATRIUM END SYS DIAM: 41 mm
LV E/e' medial: 8.54
LV SIMPSON'S DISK: 64
LV dias vol: 81 mL (ref 46–106)
LV sys vol index: 14 mL/m2
LVDIAVOLIN: 38 mL/m2
LVEEAVG: 8.54
LVELAT: 8.81 cm/s
LVOT SV: 82 mL
LVOT area: 3.14 cm2
LVOT diameter: 20 mm
LVOT peak vel: 131 cm/s
LVSYSVOL: 30 mL (ref 14–42)
MV Dec: 176
MV pk E vel: 75.2 m/s
MVPG: 2 mmHg
MVPKAVEL: 83.4 m/s
PW: 12.3 mm — AB (ref 0.6–1.1)
Stroke v: 52 ml
TDI e' lateral: 8.81
TDI e' medial: 8.49

## 2016-02-07 NOTE — Progress Notes (Signed)
  Echocardiogram 2D Echocardiogram limited has been performed.  Tresa Res 02/07/2016, 10:59 AM

## 2016-02-11 ENCOUNTER — Ambulatory Visit: Payer: Self-pay | Admitting: Radiation Oncology

## 2016-02-12 ENCOUNTER — Telehealth: Payer: Self-pay | Admitting: Radiation Oncology

## 2016-02-12 ENCOUNTER — Ambulatory Visit
Admission: RE | Admit: 2016-02-12 | Discharge: 2016-02-12 | Disposition: A | Discharge status: Home / Self Care | Payer: Medicare Other | Source: Ambulatory Visit | Attending: Radiation Oncology | Admitting: Radiation Oncology

## 2016-02-12 DIAGNOSIS — C7949 Secondary malignant neoplasm of other parts of nervous system: Principal | ICD-10-CM

## 2016-02-12 DIAGNOSIS — C7931 Secondary malignant neoplasm of brain: Secondary | ICD-10-CM

## 2016-02-12 MED ORDER — GADOBENATE DIMEGLUMINE 529 MG/ML IV SOLN: 20.0000 mL | Freq: Once | INTRAVENOUS | Status: DC | PRN

## 2016-02-12 NOTE — Telephone Encounter (Signed)
Originally patient scheduled to follow up with Dr. Tammi Klippel on Thursday, August 31. Per physician's order appointment rescheduled. Appointment moved to th at September 7th at 1315. Phoned patient's home to inform her of this change. No answer. Left message. Phoned son and emergency contact, Christopher. No answer. Left message. He phoned back and confirmed appointment for September 7th at 1315.

## 2016-02-14 ENCOUNTER — Ambulatory Visit: Payer: Self-pay | Admitting: Radiation Oncology

## 2016-02-21 ENCOUNTER — Ambulatory Visit: Payer: Self-pay | Admitting: Radiation Oncology

## 2016-02-21 ENCOUNTER — Ambulatory Visit
Admission: RE | Admit: 2016-02-21 | Discharge: 2016-02-21 | Disposition: A | Discharge status: Home / Self Care | Payer: Medicare Other | Source: Ambulatory Visit | Attending: Radiation Oncology | Admitting: Radiation Oncology

## 2016-02-21 DIAGNOSIS — C7931 Secondary malignant neoplasm of brain: Secondary | ICD-10-CM | POA: Insufficient documentation

## 2016-02-21 DIAGNOSIS — I5032 Chronic diastolic (congestive) heart failure: Secondary | ICD-10-CM | POA: Insufficient documentation

## 2016-02-21 DIAGNOSIS — C50912 Malignant neoplasm of unspecified site of left female breast: Secondary | ICD-10-CM | POA: Diagnosis not present

## 2016-02-21 DIAGNOSIS — C50911 Malignant neoplasm of unspecified site of right female breast: Secondary | ICD-10-CM | POA: Insufficient documentation

## 2016-02-21 DIAGNOSIS — I110 Hypertensive heart disease with heart failure: Secondary | ICD-10-CM | POA: Diagnosis not present

## 2016-02-21 MED FILL — ANASTROZOLE 1 MG TABLET: 1 | 30 days supply | Qty: 30 | Fill #2

## 2016-02-21 NOTE — Progress Notes (Signed)
Weight and vitals stable. Denies pain. Denies headache, dizziness, nausea, vomiting or diplopia. Steady gait noted. Reports a delay in her short term memory but, nothing that limits her. Reports she manages fluid in her ears by instilling peroxide and water twice per week. Scheduled for labs and infusion on 02/26/16. Here to review recent MRI. Reports she has returned to work and volunteering. Denies fatigue.    BP 124/64 (BP Location: Right Arm, Patient Position: Sitting, Cuff Size: Large)   Pulse 73   Temp 98.2 F (36.8 C) (Oral)   Resp 16   Wt 214 lb 6.4 oz (97.3 kg)   SpO2 97%   BMI 31.66 kg/m  Wt Readings from Last 3 Encounters:  02/21/16 214 lb 6.4 oz (97.3 kg)  02/12/16 214 lb (97.1 kg)  02/05/16 214 lb 3.2 oz (97.2 kg)

## 2016-02-21 NOTE — Progress Notes (Signed)
Radiation Oncology         [phone] ________________________________  Name: Linda [name] MRN: [id number]  Date: 02/21/2016  DOB: 1945-11-22    Follow-Up Visit Note  CC: Leeanne Rio, PA-C  Magrinat, Virgie Dad, MD  Diagnosis:   70 year-old woman with metastatic bilateral breast cancer    ICD-9-CM ICD-10-CM   1. Brain metastases (HCC) 198.3 C79.31     Interval Since Last Radiation:  16  months   10/09/14-10/27/14: Whole brain radiation to 37.5 grays in 15 fractions  Narrative:  The patient returns today for routine follow-up and here to review recent MRI. Weight and vitals stable. Scheduled for labs and infusion on 02/26/2016.   On review of systems, the patient reports that she is doing well overall. She denies any chest pain, shortness of breath, cough, fevers, chills, night sweats, unintended weight changes. She denies any bowel or bladder disturbances, and denies abdominal pain, nausea or vomiting. She denies any new musculoskeletal or joint aches or pains. She denies fatigue. She reports a delay in her short term memory but, nothing that limits her. Reports she manages fluid in her ears by instilling peroxide and water twice per week. Reports she has returned to work and volunteering. A complete review of systems is obtained and is otherwise negative.    Past Medical History:  Past Medical History:  Diagnosis Date  . Acute on chronic diastolic CHF (congestive heart failure) ([place])   . Acute pulmonary embolism ([place]) 12/13/2014  . Allergy   . Blood transfusion without reported diagnosis   . Breast cancer ([place]) 07/30/11 dx   Right  invasive ductal ca 7 0'clock,& left breast=invasive ductal ca  and dcis, left axilla nlymph node, metastatic ca  . Bronchitis    hx  . Cancer ([place]) 08-13-11   07-31-11-Dx. Bilarteral Breast cancer-left greater than rt.  . Cancer of central portion of left female breast ([place]) 08/01/2011  . GERD (gastroesophageal reflux disease)    doing well   . Hematuria, undiagnosed cause 08-13-11   Being evaluated by [facility] 08-14-11  . History of radiation therapy 02/20/12-04/15/12   left breast,total 60.4 Gy  . Hypertension   . Radiation-induced dermatitis 03/26/2012   Using radioplex cream, plus neosporin.   . S/P radiation therapy 10/09/14-10/27/14   whole brain 37.5Gy/60fx  . Seasonal allergies   . Seroma 02/04/12   right breast  200cc removed  erythema on right side  . Wears partial dentures    wears upper and lower partial    Past Surgical History: Past Surgical History:  Procedure Laterality Date  . BREAST SURGERY    . CHEST TUBE INSERTION Left 12/14/2014   Procedure: CHEST TUBE INSERTION;  Surgeon: Rexene Alberts, MD;  Location: [facility];  Service: Thoracic;  Laterality: Left;  . child birth  08-13-11   x3 -NVD  . MASTECTOMY W/ SENTINEL NODE BIOPSY  01/21/2012   Procedure: MASTECTOMY WITH SENTINEL LYMPH NODE BIOPSY;  Surgeon: Harl Bowie, MD;  Location: [facility];  Service: General;  Laterality: Bilateral;  Left modified radical mastectomy, Rt mastectomy with Sentinel lymphnode biospy  . PORT-A-CATH REMOVAL Right 12/22/2012   Procedure: REMOVAL PORT-A-CATH;  Surgeon: Harl Bowie, MD;  Location: [facility];  Service: General;  Laterality: Right;  . PORTACATH PLACEMENT  08/15/2011   Procedure: INSERTION PORT-A-CATH;  Surgeon: Harl Bowie, MD;  Location: [facility];  Service: General;  Laterality: N/A;  . SUBXYPHOID PERICARDIAL WINDOW N/A 12/14/2014  Procedure: SUBXYPHOID PERICARDIAL WINDOW;  Surgeon: Rexene Alberts, MD;  Location: [facility];  Service: Thoracic;  Laterality: N/A;  . TEE WITHOUT CARDIOVERSION N/A 12/14/2014   Procedure: TRANSESOPHAGEAL ECHOCARDIOGRAM (TEE);  Surgeon: Rexene Alberts, MD;  Location: [facility];  Service: Thoracic;  Laterality: N/A;    Social History:  Social History   Social History  . Marital status: Single    Spouse name: N/A  . Number of children: 3  .  Years of education: N/A   Occupational History  . Retired    Social History Main Topics  . Smoking status: Never Smoker  . Smokeless tobacco: Never Used  . Alcohol use 0.0 oz/week     Comment: rare- occ.  . Drug use: No  . Sexual activity: No     Comment: gxp3, 1st child age 52, menopause mid 66's,no hrt   Other Topics Concern  . Not on file   Social History Narrative   Lives with son.  Single.  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 She moved to [place] about 2 years ago but has a Games developer in [place]. Son Gerald Stabs lives in [place] and works as a Airline pilot. His wife is a Marine scientist. Son Shanon Brow lives at [place] and is a Advertising account executive in addition to having the [employer]. The patient attends a local [employer] here.    Family History: Family History  Problem Relation Age of Onset  . Heart disease Mother   . Cancer Mother 17    breast, , 4 deceased  . Heart attack Father   . Cancer Sister 64    breast  . Colon cancer Neg Hx     ALLERGIES:  is allergic to ace inhibitors.  Meds: Current Outpatient Prescriptions  Medication Sig Dispense Refill  . anastrozole (ARIMIDEX) 1 MG tablet Take 1 tablet (1 mg total) by mouth daily. 30 tablet 3  . b complex vitamins tablet Take 1 tablet by mouth daily.    . cholecalciferol (VITAMIN D) 1000 UNITS tablet Take 1 tablet (1,000 Units total) by mouth daily. 100 tablet 12  . docusate sodium (COLACE) 100 MG capsule Take 1 capsule (100 mg total) by mouth 2 (two) times daily. 10 capsule 0  . furosemide (LASIX) 20 MG tablet Take 1 tablet (20 mg total) by mouth 2 (two) times a week. 30 tablet 12  . magnesium oxide (MAG-OX) 400 (241.3 Mg) MG tablet Take 1 tablet (400 mg total) by mouth daily. 30 tablet 12  . Multiple Vitamins-Iron (MULTIVITAMINS WITH IRON) TABS Take 1 tablet by mouth daily.    . potassium chloride SA  (K-DUR,KLOR-CON) 20 MEQ tablet Take 1 tablet (20 mEq total) by mouth daily. 30 tablet 12   No current facility-administered medications for this encounter.     Physical Findings:  weight is 214 lb 6.4 oz (97.3 kg). Her oral temperature is 98.2 F (36.8 C). Her blood pressure is 124/64 and her pulse is 73. Her respiration is 16 and oxygen saturation is 97%.    In general this is a well appearing caucasian female in no acute distress. She's alert and oriented x4 and appropriate throughout the examination. Cardiopulmonary assessment is negative for acute distress and she exhibits normal effort.    Lab Findings: Lab Results  Component Value Date   WBC 4.6 02/05/2016   WBC 6.4 12/20/2014   HGB  12.5 02/05/2016   HCT 37.3 02/05/2016   PLT 144 (L) 02/05/2016    Lab Results  Component Value Date   NA 140 02/05/2016   K 3.7 02/05/2016   CHLORIDE 105 02/05/2016   CO2 26 02/05/2016   GLUCOSE 151 (H) 02/05/2016   GLUCOSE 96 11/01/2012   BUN 19.5 02/05/2016   CREATININE 0.8 02/05/2016   BILITOT 0.85 02/05/2016   ALKPHOS 128 02/05/2016   AST 48 (H) 02/05/2016   ALT 34 02/05/2016   PROT 7.8 02/05/2016   ALBUMIN 3.1 (L) 02/05/2016   CALCIUM 9.6 02/05/2016   ANIONGAP 8 02/05/2016   ANIONGAP 6 12/22/2014    Radiographic Findings: Mr Jeri Cos [id number] Contrast  Result Date: 02/12/2016 CLINICAL DATA:  History of metastatic breast cancer with metastatic to brain. 4 month restaging stereotactic radiosurgery EXAM: MRI HEAD WITHOUT AND WITH CONTRAST TECHNIQUE: Multiplanar, multiecho pulse sequences of the brain and surrounding structures were obtained without and with intravenous contrast. CONTRAST:  20 mL MultiHance IV COMPARISON:  MRI head 10/12/2015 FINDINGS: Ventricle size remains normal. Progression of hyperintensity throughout the cerebral white matter compatible with post treatment effect. Negative for acute infarct. Chronic hemorrhage superior cerebellar vermis unchanged. No other areas of  hemorrhage Negative for mass or edema.  No shift of the midline structures No enhancing metastatic deposits in the brain postcontrast administration. Small enhancing vein in the left medial parietal lobe unchanged from prior studies. 11 mm enhancing lesion left frontal bone unchanged and compatible with metastatic disease. No new skeletal lesions. Negative for orbital mass lesion. Mild mucosal edema in the paranasal sinuses IMPRESSION: Left frontal bone enhancing lesion compatible with metastatic disease unchanged No enhancing metastatic lesions in the brain Electronically Signed   By: Franchot Gallo M.D.   On: 02/12/2016 14:20    Impression: The patient has recovered well from the effects of radiation treatment. Brain MRI on 02/12/2016 shows no evidence of recurrence.   Plan: Repeat Brain MRI in 6 months with follow up to discuss these results.   ------------------------------------------------   Tyler Pita, MD [place] Director and Director of Stereotactic Radiosurgery Direct Dial: [phone]  Fax: [phone] [email or web address]  Skype  LinkedIn  This document serves as a record of services personally performed by Tyler Pita, MD. It was created on his behalf by Arlyce Harman, a trained medical scribe. The creation of this record is based on the scribe's personal observations and the provider's statements to them. This document has been checked and approved by the attending provider.

## 2016-02-26 ENCOUNTER — Ambulatory Visit (HOSPITAL_BASED_OUTPATIENT_CLINIC_OR_DEPARTMENT_OTHER): Payer: Medicare Other

## 2016-02-26 ENCOUNTER — Other Ambulatory Visit (HOSPITAL_BASED_OUTPATIENT_CLINIC_OR_DEPARTMENT_OTHER): Payer: Medicare Other

## 2016-02-26 VITALS — BP 145/71 | HR 73 | Temp 98.2°F | Resp 16

## 2016-02-26 DIAGNOSIS — C7951 Secondary malignant neoplasm of bone: Secondary | ICD-10-CM | POA: Diagnosis not present

## 2016-02-26 DIAGNOSIS — C7931 Secondary malignant neoplasm of brain: Secondary | ICD-10-CM

## 2016-02-26 DIAGNOSIS — C50919 Malignant neoplasm of unspecified site of unspecified female breast: Secondary | ICD-10-CM

## 2016-02-26 DIAGNOSIS — Z5112 Encounter for antineoplastic immunotherapy: Secondary | ICD-10-CM

## 2016-02-26 DIAGNOSIS — C50912 Malignant neoplasm of unspecified site of left female breast: Secondary | ICD-10-CM

## 2016-02-26 DIAGNOSIS — J910 Malignant pleural effusion: Secondary | ICD-10-CM

## 2016-02-26 DIAGNOSIS — C50811 Malignant neoplasm of overlapping sites of right female breast: Secondary | ICD-10-CM

## 2016-02-26 DIAGNOSIS — C7800 Secondary malignant neoplasm of unspecified lung: Secondary | ICD-10-CM

## 2016-02-26 DIAGNOSIS — C50911 Malignant neoplasm of unspecified site of right female breast: Secondary | ICD-10-CM

## 2016-02-26 DIAGNOSIS — C50812 Malignant neoplasm of overlapping sites of left female breast: Secondary | ICD-10-CM

## 2016-02-26 LAB — CBC WITH DIFFERENTIAL/PLATELET
BASO%: 0.7 % (ref 0.0–2.0)
BASOS ABS: 0 10*3/uL (ref 0.0–0.1)
EOS ABS: 0.1 10*3/uL (ref 0.0–0.5)
EOS%: 2.2 % (ref 0.0–7.0)
HCT: 37 % (ref 34.8–46.6)
HGB: 12.2 g/dL (ref 11.6–15.9)
LYMPH%: 30 % (ref 14.0–49.7)
MCH: 31.4 pg (ref 25.1–34.0)
MCHC: 33.1 g/dL (ref 31.5–36.0)
MCV: 94.9 fL (ref 79.5–101.0)
MONO#: 0.6 10*3/uL (ref 0.1–0.9)
MONO%: 14.1 % — ABNORMAL HIGH (ref 0.0–14.0)
NEUT#: 2.3 10*3/uL (ref 1.5–6.5)
NEUT%: 53 % (ref 38.4–76.8)
PLATELETS: 142 10*3/uL — AB (ref 145–400)
RBC: 3.9 10*6/uL (ref 3.70–5.45)
RDW: 16 % — ABNORMAL HIGH (ref 11.2–14.5)
WBC: 4.3 10*3/uL (ref 3.9–10.3)
lymph#: 1.3 10*3/uL (ref 0.9–3.3)

## 2016-02-26 LAB — COMPREHENSIVE METABOLIC PANEL
ALT: 41 U/L (ref 0–55)
AST: 54 U/L — ABNORMAL HIGH (ref 5–34)
Albumin: 3.1 g/dL — ABNORMAL LOW (ref 3.5–5.0)
Alkaline Phosphatase: 123 U/L (ref 40–150)
Anion Gap: 7 mEq/L (ref 3–11)
BUN: 8.8 mg/dL (ref 7.0–26.0)
CHLORIDE: 105 meq/L (ref 98–109)
CO2: 30 mEq/L — ABNORMAL HIGH (ref 22–29)
Calcium: 9.7 mg/dL (ref 8.4–10.4)
Creatinine: 0.7 mg/dL (ref 0.6–1.1)
EGFR: 86 mL/min/{1.73_m2} — AB (ref >90)
GLUCOSE: 101 mg/dL (ref 70–140)
POTASSIUM: 3.8 meq/L (ref 3.5–5.1)
SODIUM: 142 meq/L (ref 136–145)
Total Bilirubin: 0.95 mg/dL (ref 0.20–1.20)
Total Protein: 7.7 g/dL (ref 6.4–8.3)

## 2016-02-26 MED ORDER — DIPHENHYDRAMINE HCL 25 MG PO CAPS
ORAL_CAPSULE | ORAL | Status: AC
  Filled 2016-02-26: qty 1

## 2016-02-26 MED ORDER — DIPHENHYDRAMINE HCL 25 MG PO CAPS
25.0000 mg | ORAL_CAPSULE | Freq: Once | ORAL | Status: AC
  Administered 2016-02-26: 25 mg via ORAL

## 2016-02-26 MED ORDER — SODIUM CHLORIDE 0.9 % IV SOLN
3.5000 mg/kg | Freq: Once | INTRAVENOUS | Status: AC
  Administered 2016-02-26: 360 mg via INTRAVENOUS
  Filled 2016-02-26: qty 8

## 2016-02-26 MED ORDER — HEPARIN SOD (PORK) LOCK FLUSH 100 UNIT/ML IV SOLN
500.0000 [IU] | Freq: Once | INTRAVENOUS | Status: AC | PRN
  Administered 2016-02-26: 500 [IU]
  Filled 2016-02-26: qty 5

## 2016-02-26 MED ORDER — ACETAMINOPHEN 325 MG PO TABS
ORAL_TABLET | ORAL | Status: AC
  Filled 2016-02-26: qty 2

## 2016-02-26 MED ORDER — SODIUM CHLORIDE 0.9 % IV SOLN
Freq: Once | INTRAVENOUS | Status: AC
  Administered 2016-02-26: 13:00:00 via INTRAVENOUS

## 2016-02-26 MED ORDER — ACETAMINOPHEN 325 MG PO TABS
650.0000 mg | ORAL_TABLET | Freq: Once | ORAL | Status: AC
  Administered 2016-02-26: 650 mg via ORAL

## 2016-02-26 MED ORDER — SODIUM CHLORIDE 0.9 % IJ SOLN
10.0000 mL | INTRAMUSCULAR | Status: DC | PRN
  Administered 2016-02-26: 10 mL
  Filled 2016-02-26: qty 10

## 2016-02-26 NOTE — Patient Instructions (Signed)
[facility] Discharge Instructions for Patients Receiving Chemotherapy  Today you received the following chemotherapy agents: Kadcyla  To help prevent nausea and vomiting after your treatment, we encourage you to take your nausea medication as directed.    If you develop nausea and vomiting that is not controlled by your nausea medication, call the clinic.   BELOW ARE SYMPTOMS THAT SHOULD BE REPORTED IMMEDIATELY:  *FEVER GREATER THAN 100.5 F  *CHILLS WITH OR WITHOUT FEVER  NAUSEA AND VOMITING THAT IS NOT CONTROLLED WITH YOUR NAUSEA MEDICATION  *UNUSUAL SHORTNESS OF BREATH  *UNUSUAL BRUISING OR BLEEDING  TENDERNESS IN MOUTH AND THROAT WITH OR WITHOUT PRESENCE OF ULCERS  *URINARY PROBLEMS  *BOWEL PROBLEMS  UNUSUAL RASH Items with * indicate a potential emergency and should be followed up as soon as possible.  Feel free to call the clinic you have any questions or concerns. The clinic phone number is [phone].  Please show the CHEMO ALERT CARD at check-in to the Emergency Department and triage nurse.   

## 2016-03-17 ENCOUNTER — Other Ambulatory Visit: Payer: Self-pay | Admitting: *Deleted

## 2016-03-18 ENCOUNTER — Ambulatory Visit (HOSPITAL_BASED_OUTPATIENT_CLINIC_OR_DEPARTMENT_OTHER): Payer: Medicare Other

## 2016-03-18 ENCOUNTER — Other Ambulatory Visit (HOSPITAL_BASED_OUTPATIENT_CLINIC_OR_DEPARTMENT_OTHER): Payer: Medicare Other

## 2016-03-18 ENCOUNTER — Other Ambulatory Visit: Payer: Self-pay | Admitting: Oncology

## 2016-03-18 VITALS — BP 135/59 | HR 69 | Temp 97.8°F | Resp 18

## 2016-03-18 DIAGNOSIS — C7951 Secondary malignant neoplasm of bone: Secondary | ICD-10-CM

## 2016-03-18 DIAGNOSIS — C7800 Secondary malignant neoplasm of unspecified lung: Secondary | ICD-10-CM

## 2016-03-18 DIAGNOSIS — C50811 Malignant neoplasm of overlapping sites of right female breast: Secondary | ICD-10-CM

## 2016-03-18 DIAGNOSIS — J910 Malignant pleural effusion: Secondary | ICD-10-CM

## 2016-03-18 DIAGNOSIS — C50812 Malignant neoplasm of overlapping sites of left female breast: Secondary | ICD-10-CM

## 2016-03-18 DIAGNOSIS — C50912 Malignant neoplasm of unspecified site of left female breast: Secondary | ICD-10-CM | POA: Diagnosis not present

## 2016-03-18 DIAGNOSIS — C7931 Secondary malignant neoplasm of brain: Secondary | ICD-10-CM

## 2016-03-18 DIAGNOSIS — Z5112 Encounter for antineoplastic immunotherapy: Secondary | ICD-10-CM | POA: Diagnosis present

## 2016-03-18 DIAGNOSIS — C50911 Malignant neoplasm of unspecified site of right female breast: Secondary | ICD-10-CM | POA: Diagnosis present

## 2016-03-18 DIAGNOSIS — C50919 Malignant neoplasm of unspecified site of unspecified female breast: Secondary | ICD-10-CM

## 2016-03-18 LAB — COMPREHENSIVE METABOLIC PANEL
ALBUMIN: 3.1 g/dL — AB (ref 3.5–5.0)
ALK PHOS: 136 U/L (ref 40–150)
ALT: 41 U/L (ref 0–55)
AST: 54 U/L — ABNORMAL HIGH (ref 5–34)
Anion Gap: 8 mEq/L (ref 3–11)
BUN: 14.9 mg/dL (ref 7.0–26.0)
CO2: 28 mEq/L (ref 22–29)
Calcium: 9.4 mg/dL (ref 8.4–10.4)
Chloride: 106 mEq/L (ref 98–109)
Creatinine: 0.7 mg/dL (ref 0.6–1.1)
EGFR: 88 mL/min/{1.73_m2} — AB (ref >90)
GLUCOSE: 90 mg/dL (ref 70–140)
Potassium: 3.6 mEq/L (ref 3.5–5.1)
SODIUM: 141 meq/L (ref 136–145)
TOTAL PROTEIN: 7.6 g/dL (ref 6.4–8.3)
Total Bilirubin: 0.94 mg/dL (ref 0.20–1.20)

## 2016-03-18 LAB — CBC WITH DIFFERENTIAL/PLATELET
BASO%: 0.6 % (ref 0.0–2.0)
Basophils Absolute: 0 10*3/uL (ref 0.0–0.1)
EOS ABS: 0.1 10*3/uL (ref 0.0–0.5)
EOS%: 2.3 % (ref 0.0–7.0)
HCT: 36.2 % (ref 34.8–46.6)
HEMOGLOBIN: 12 g/dL (ref 11.6–15.9)
LYMPH#: 1.3 10*3/uL (ref 0.9–3.3)
LYMPH%: 31.7 % (ref 14.0–49.7)
MCH: 31.2 pg (ref 25.1–34.0)
MCHC: 33 g/dL (ref 31.5–36.0)
MCV: 94.6 fL (ref 79.5–101.0)
MONO#: 0.6 10*3/uL (ref 0.1–0.9)
MONO%: 14.4 % — ABNORMAL HIGH (ref 0.0–14.0)
NEUT%: 51 % (ref 38.4–76.8)
NEUTROS ABS: 2 10*3/uL (ref 1.5–6.5)
PLATELETS: 147 10*3/uL (ref 145–400)
RBC: 3.83 10*6/uL (ref 3.70–5.45)
RDW: 16 % — AB (ref 11.2–14.5)
WBC: 4 10*3/uL (ref 3.9–10.3)

## 2016-03-18 MED ORDER — HEPARIN SOD (PORK) LOCK FLUSH 100 UNIT/ML IV SOLN
500.0000 [IU] | Freq: Once | INTRAVENOUS | Status: AC | PRN
  Administered 2016-03-18: 500 [IU]
  Filled 2016-03-18: qty 5

## 2016-03-18 MED ORDER — SODIUM CHLORIDE 0.9 % IV SOLN
3.5000 mg/kg | Freq: Once | INTRAVENOUS | Status: AC
  Administered 2016-03-18: 360 mg via INTRAVENOUS
  Filled 2016-03-18: qty 8

## 2016-03-18 MED ORDER — DIPHENHYDRAMINE HCL 25 MG PO CAPS
ORAL_CAPSULE | ORAL | Status: AC
  Filled 2016-03-18: qty 1

## 2016-03-18 MED ORDER — SODIUM CHLORIDE 0.9 % IV SOLN
Freq: Once | INTRAVENOUS | Status: AC
  Administered 2016-03-18: 12:00:00 via INTRAVENOUS

## 2016-03-18 MED ORDER — ACETAMINOPHEN 325 MG PO TABS
ORAL_TABLET | ORAL | Status: AC
  Filled 2016-03-18: qty 2

## 2016-03-18 MED ORDER — ACETAMINOPHEN 325 MG PO TABS
650.0000 mg | ORAL_TABLET | Freq: Once | ORAL | Status: AC
Start: 2016-03-18 — End: 2016-03-18
  Administered 2016-03-18: 650 mg via ORAL

## 2016-03-18 MED ORDER — DIPHENHYDRAMINE HCL 25 MG PO CAPS
25.0000 mg | ORAL_CAPSULE | Freq: Once | ORAL | Status: AC
  Administered 2016-03-18: 25 mg via ORAL

## 2016-03-18 MED ORDER — SODIUM CHLORIDE 0.9 % IJ SOLN
10.0000 mL | INTRAMUSCULAR | Status: DC | PRN
  Administered 2016-03-18: 10 mL
  Filled 2016-03-18: qty 10

## 2016-03-18 NOTE — Patient Instructions (Signed)
[facility] Discharge Instructions for Patients Receiving Chemotherapy  Today you received the following chemotherapy agents: Kadcyla  To help prevent nausea and vomiting after your treatment, we encourage you to take your nausea medication as directed.    If you develop nausea and vomiting that is not controlled by your nausea medication, call the clinic.   BELOW ARE SYMPTOMS THAT SHOULD BE REPORTED IMMEDIATELY:  *FEVER GREATER THAN 100.5 F  *CHILLS WITH OR WITHOUT FEVER  NAUSEA AND VOMITING THAT IS NOT CONTROLLED WITH YOUR NAUSEA MEDICATION  *UNUSUAL SHORTNESS OF BREATH  *UNUSUAL BRUISING OR BLEEDING  TENDERNESS IN MOUTH AND THROAT WITH OR WITHOUT PRESENCE OF ULCERS  *URINARY PROBLEMS  *BOWEL PROBLEMS  UNUSUAL RASH Items with * indicate a potential emergency and should be followed up as soon as possible.  Feel free to call the clinic you have any questions or concerns. The clinic phone number is [phone].  Please show the CHEMO ALERT CARD at check-in to the Emergency Department and triage nurse.   

## 2016-03-20 MED FILL — ANASTROZOLE 1 MG TABLET: 1 | 30 days supply | Qty: 30 | Fill #3

## 2016-04-07 ENCOUNTER — Telehealth: Payer: Self-pay | Admitting: *Deleted

## 2016-04-07 NOTE — Telephone Encounter (Signed)
VM message received from patient requesting call back to confirm appts for Wednesday, 04/09/16.  TCT patient and reviewed appts with her.  No further questions or concerns,.

## 2016-04-09 ENCOUNTER — Ambulatory Visit (HOSPITAL_BASED_OUTPATIENT_CLINIC_OR_DEPARTMENT_OTHER): Payer: Medicare Other | Admitting: Oncology

## 2016-04-09 ENCOUNTER — Ambulatory Visit (HOSPITAL_BASED_OUTPATIENT_CLINIC_OR_DEPARTMENT_OTHER): Payer: Medicare Other

## 2016-04-09 ENCOUNTER — Other Ambulatory Visit (HOSPITAL_BASED_OUTPATIENT_CLINIC_OR_DEPARTMENT_OTHER): Payer: Medicare Other

## 2016-04-09 VITALS — BP 139/57 | HR 87 | Temp 98.4°F | Resp 18 | Ht 69.0 in | Wt 213.9 lb

## 2016-04-09 DIAGNOSIS — C50812 Malignant neoplasm of overlapping sites of left female breast: Secondary | ICD-10-CM

## 2016-04-09 DIAGNOSIS — C50912 Malignant neoplasm of unspecified site of left female breast: Secondary | ICD-10-CM

## 2016-04-09 DIAGNOSIS — C7931 Secondary malignant neoplasm of brain: Secondary | ICD-10-CM

## 2016-04-09 DIAGNOSIS — C50911 Malignant neoplasm of unspecified site of right female breast: Secondary | ICD-10-CM

## 2016-04-09 DIAGNOSIS — Z170 Estrogen receptor positive status [ER+]: Secondary | ICD-10-CM

## 2016-04-09 DIAGNOSIS — C7951 Secondary malignant neoplasm of bone: Secondary | ICD-10-CM

## 2016-04-09 DIAGNOSIS — J910 Malignant pleural effusion: Secondary | ICD-10-CM

## 2016-04-09 DIAGNOSIS — Z5112 Encounter for antineoplastic immunotherapy: Secondary | ICD-10-CM | POA: Diagnosis present

## 2016-04-09 DIAGNOSIS — Z86711 Personal history of pulmonary embolism: Secondary | ICD-10-CM | POA: Diagnosis not present

## 2016-04-09 DIAGNOSIS — C7800 Secondary malignant neoplasm of unspecified lung: Secondary | ICD-10-CM

## 2016-04-09 DIAGNOSIS — C50811 Malignant neoplasm of overlapping sites of right female breast: Secondary | ICD-10-CM

## 2016-04-09 DIAGNOSIS — C50919 Malignant neoplasm of unspecified site of unspecified female breast: Secondary | ICD-10-CM

## 2016-04-09 LAB — CBC WITH DIFFERENTIAL/PLATELET
BASO%: 0.5 % (ref 0.0–2.0)
BASOS ABS: 0 10*3/uL (ref 0.0–0.1)
EOS ABS: 0.1 10*3/uL (ref 0.0–0.5)
EOS%: 1.2 % (ref 0.0–7.0)
HEMATOCRIT: 34.8 % (ref 34.8–46.6)
HGB: 11.5 g/dL — ABNORMAL LOW (ref 11.6–15.9)
LYMPH#: 1.3 10*3/uL (ref 0.9–3.3)
LYMPH%: 21.9 % (ref 14.0–49.7)
MCH: 31.7 pg (ref 25.1–34.0)
MCHC: 33.1 g/dL (ref 31.5–36.0)
MCV: 95.8 fL (ref 79.5–101.0)
MONO#: 1 10*3/uL — AB (ref 0.1–0.9)
MONO%: 15.6 % — ABNORMAL HIGH (ref 0.0–14.0)
NEUT#: 3.7 10*3/uL (ref 1.5–6.5)
NEUT%: 60.8 % (ref 38.4–76.8)
PLATELETS: 154 10*3/uL (ref 145–400)
RBC: 3.63 10*6/uL — ABNORMAL LOW (ref 3.70–5.45)
RDW: 16.2 % — ABNORMAL HIGH (ref 11.2–14.5)
WBC: 6.2 10*3/uL (ref 3.9–10.3)

## 2016-04-09 LAB — COMPREHENSIVE METABOLIC PANEL
ALK PHOS: 141 U/L (ref 40–150)
ALT: 34 U/L (ref 0–55)
ANION GAP: 7 meq/L (ref 3–11)
AST: 44 U/L — ABNORMAL HIGH (ref 5–34)
Albumin: 3 g/dL — ABNORMAL LOW (ref 3.5–5.0)
BUN: 10.8 mg/dL (ref 7.0–26.0)
CALCIUM: 9.8 mg/dL (ref 8.4–10.4)
CHLORIDE: 106 meq/L (ref 98–109)
CO2: 30 mEq/L — ABNORMAL HIGH (ref 22–29)
Creatinine: 0.7 mg/dL (ref 0.6–1.1)
EGFR: 85 mL/min/{1.73_m2} — AB (ref >90)
Glucose: 99 mg/dl (ref 70–140)
POTASSIUM: 3.7 meq/L (ref 3.5–5.1)
Sodium: 142 mEq/L (ref 136–145)
Total Bilirubin: 1.18 mg/dL (ref 0.20–1.20)
Total Protein: 7.8 g/dL (ref 6.4–8.3)

## 2016-04-09 MED ORDER — DIPHENHYDRAMINE HCL 25 MG PO CAPS
ORAL_CAPSULE | ORAL | Status: AC
  Filled 2016-04-09: qty 1

## 2016-04-09 MED ORDER — HEPARIN SOD (PORK) LOCK FLUSH 100 UNIT/ML IV SOLN
500.0000 [IU] | Freq: Once | INTRAVENOUS | Status: AC | PRN
  Administered 2016-04-09: 500 [IU]
  Filled 2016-04-09: qty 5

## 2016-04-09 MED ORDER — DIPHENHYDRAMINE HCL 25 MG PO CAPS
25.0000 mg | ORAL_CAPSULE | Freq: Once | ORAL | Status: AC
  Administered 2016-04-09: 25 mg via ORAL

## 2016-04-09 MED ORDER — SODIUM CHLORIDE 0.9 % IJ SOLN
10.0000 mL | INTRAMUSCULAR | Status: DC | PRN
  Administered 2016-04-09: 10 mL
  Filled 2016-04-09: qty 10

## 2016-04-09 MED ORDER — ACETAMINOPHEN 325 MG PO TABS
650.0000 mg | ORAL_TABLET | Freq: Once | ORAL | Status: AC
  Administered 2016-04-09: 650 mg via ORAL

## 2016-04-09 MED ORDER — SODIUM CHLORIDE 0.9 % IV SOLN
Freq: Once | INTRAVENOUS | Status: AC
  Administered 2016-04-09: 16:00:00 via INTRAVENOUS

## 2016-04-09 MED ORDER — ACETAMINOPHEN 325 MG PO TABS
ORAL_TABLET | ORAL | Status: AC
Start: 2016-04-09 — End: 2016-04-09
  Filled 2016-04-09: qty 2

## 2016-04-09 MED ORDER — SODIUM CHLORIDE 0.9 % IV SOLN
3.5000 mg/kg | Freq: Once | INTRAVENOUS | Status: AC
  Administered 2016-04-09: 360 mg via INTRAVENOUS
  Filled 2016-04-09: qty 10

## 2016-04-09 NOTE — Progress Notes (Signed)
ID: Linda [name]   DOB: [birth year]  MR#: [id number]  QPR#:[id number]  PCP: Leeanne Rio, PA-C GYN: SU: Coralie Keens OTHER MD: Crissie Reese, Linna Hoff Bensimhon  CHIEF COMPLAINT:  Bilateral Breast Cancers  CURRENT TREATMENT: Anastrozole, T-DM 1, zolendrnate  BREAST CANCER HISTORY: From the original intake note:  The patient noted a small amount of drainage from her left breast December of 2012. She brought it to her gynecologist's attention in January of 2013 and was set up for bilateral diagnostic mammography at the [facility] Center July 24, 2011. This was the patient's first ever mammogram. It showed a large irregular mass in the left retroareolar region extending to the nipple, with nipple retraction and skin thickening. This measured approximately 8.4 cm. It was associated with pleomorphic microcalcifications. By exam there was moderate distortion and retraction of the nipple with a large palpable ill-defined area in the retroareolar region. In the right right breast there was also a 2 cm hard mass palpated. Ultrasound of the right breast mass showed a complex cystic/solid area measuring 1.9 cm. Ultrasound of the right axilla was negative. Ultrasound of the left breast showed a large hypoechoic mass measuring at least 3.8 cm. The left axilla showed several adjacent abnormal appearing lymph nodes.  With this information biopsy of the right and left breast masses were obtained the same day, and showed ([id number])   (a) on the right, and invasive ductal carcinoma with papillary features, estrogen and progesterone receptor positive, HER-2 negative, with an MIB-1 of 10%.   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 The MIB-1 was 60% for the left-sided tumor.   With this information the patient was presented at the multidisciplinary breast cancer conference 08/06/2011. Subsequent  evaluation and treatments are as detailed below.  INTERVAL HISTORY: Linda [name] returns today for follow up of her triple positive breast cancer accompanied by her friend done. Linda [name] is tolerating anastrozole well.Hot flashes and vaginal dryness are not a major issue. She never developed the arthralgias or myalgias that many patients can experience on this medication. She obtains it at a good price. She is also receiving T-DM 1 with no side effects that she is aware of. Finally she receives his alendronate every 3 months, with her next dose due mid-November. She has no side effects from that either   REVIEW OF SYSTEMS: Linda [name] is trying to sell her house and plans to live with her son in [place]. She has been doing a lot of walking up and down steps at the new place. She was walking her dog, fell, and used her left leg, which got a bit swollen, but is now "the same size as before. She denies unusual headaches, visual changes, nausea, or vomiting or balance is not the greatest and now she is using a walking stick, which I think is a very good move on her part. She continues to work a day week. She will need cataract surgery next spring. Aside from these issues a detailed review of systems today was noncontributory  PAST MEDICAL HISTORY: Past Medical History:  Diagnosis Date  . Acute on chronic diastolic CHF (congestive heart failure) ([place])   . Acute pulmonary embolism ([place]) 12/13/2014  . Allergy   . Blood transfusion without reported diagnosis   . Breast cancer ([place]) 07/30/11 dx   Right  invasive ductal ca 7 0'clock,& left breast=invasive ductal ca  and dcis, left axilla nlymph node, metastatic  ca  . Bronchitis    hx  . Cancer ([place]) 08-13-11   07-31-11-Dx. Bilarteral Breast cancer-left greater than rt.  . Cancer of central portion of left female breast ([place]) 08/01/2011  . GERD (gastroesophageal reflux disease)    doing well  . Hematuria, undiagnosed cause 08-13-11   Being evaluated by [facility] 08-14-11   . History of radiation therapy 02/20/12-04/15/12   left breast,total 60.4 Gy  . Hypertension   . Radiation-induced dermatitis 03/26/2012   Using radioplex cream, plus neosporin.   . S/P radiation therapy 10/09/14-10/27/14   whole brain 37.5Gy/64f  . Seasonal allergies   . Seroma 02/04/12   right breast  200cc removed  erythema on right side  . Wears partial dentures    wears upper and lower partial    PAST SURGICAL HISTORY: Past Surgical History:  Procedure Laterality Date  . BREAST SURGERY    . CHEST TUBE INSERTION Left 12/14/2014   Procedure: CHEST TUBE INSERTION;  Surgeon: CRexene Alberts MD;  [facility]  Service: Thoracic;  Laterality: Left;  . child birth  08-13-11   x3 -NVD  . MASTECTOMY W/ SENTINEL NODE BIOPSY  01/21/2012   Procedure: MASTECTOMY WITH SENTINEL LYMPH NODE BIOPSY;  Surgeon: DHarl Bowie MD;  [facility]  Service: General;  Laterality: Bilateral;  Left modified radical mastectomy, Rt mastectomy with Sentinel lymphnode biospy  . PORT-A-CATH REMOVAL Right 12/22/2012   Procedure: REMOVAL PORT-A-CATH;  Surgeon: DHarl Bowie MD;  [facility]  Service: General;  Laterality: Right;  . PORTACATH PLACEMENT  08/15/2011   Procedure: INSERTION PORT-A-CATH;  Surgeon: DHarl Bowie MD;  Location: [facility];  Service: General;  Laterality: N/A;  . SUBXYPHOID PERICARDIAL WINDOW N/A 12/14/2014   Procedure: SUBXYPHOID PERICARDIAL WINDOW;  Surgeon: CRexene Alberts MD;  [facility]  Service: Thoracic;  Laterality: N/A;  . TEE WITHOUT CARDIOVERSION N/A 12/14/2014   Procedure: TRANSESOPHAGEAL ECHOCARDIOGRAM (TEE);  Surgeon: CRexene Alberts MD;  [facility];  Service: Thoracic;  Laterality: N/A;    FAMILY HISTORY Family History  Problem Relation Age of Onset  . Heart disease Mother   . Cancer Mother 459   breast, , 711deceased  . Heart attack Father   . Cancer Sister 641   breast  . Colon cancer Neg Hx    The patient's father died from a heart attack at the age of 69 The patient's mother died from apparently heart problems at the age of 69 The patient had no brothers. She has 3 sisters. One of her sisters was diagnosed with breast cancer in her mid 664s The patient does not know if his sister was ever genetically tested. The patient's mother had a mastectomy at the age of 322 presumably for breast cancer. There is no other history of breast or in cancer in the family to her knowledge.   GYNECOLOGIC HISTORY:  She does not recall when she had menarche. She had her first child at age 70 She is GX P3. She underwent menopause in her mid 485s She never took hormone replacement.   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 [place] has a cGames developerin [place] Son [place]erald [place] as a f[place]line pilot His wife is a nMarine scientist Son [name]  lives at [facility] and is a Advertising account executive in addition to having the [employer] The patient attends a local [employer] here   ADVANCED DIRECTIVES: Not in place.  Patient has beengiven the appropriate forms to complete and notarize at her discretion. She tells me she ihas named her daughter-in-law Jacqlyn Larsen as her healthcare power of attorney  HEALTH MAINTENANCE: Social History  Substance Use Topics  . Smoking status: Never Smoker  . Smokeless tobacco: Never Used  . Alcohol use 0.0 oz/week     Comment: rare- occ.     Colonoscopy: Never  PAP: Feb 2013  Bone density: November 2013, normal  Lipid panel:   Allergies  Allergen Reactions  . Ace Inhibitors Cough    Current Outpatient Prescriptions  Medication Sig Dispense Refill  . anastrozole (ARIMIDEX) 1 MG tablet Take 1 tablet (1 mg total) by mouth daily. 30 tablet 3  . b complex vitamins tablet Take 1 tablet by mouth daily.     . cholecalciferol (VITAMIN D) 1000 UNITS tablet Take 1 tablet (1,000 Units total) by mouth daily. 100 tablet 12  . docusate sodium (COLACE) 100 MG capsule Take 1 capsule (100 mg total) by mouth 2 (two) times daily. 10 capsule 0  . furosemide (LASIX) 20 MG tablet Take 1 tablet (20 mg total) by mouth 2 (two) times a week. 30 tablet 12  . magnesium oxide (MAG-OX) 400 (241.3 Mg) MG tablet Take 1 tablet (400 mg total) by mouth daily. 30 tablet 12  . Multiple Vitamins-Iron (MULTIVITAMINS WITH IRON) TABS Take 1 tablet by mouth daily.    . potassium chloride SA (K-DUR,KLOR-CON) 20 MEQ tablet Take 1 tablet (20 mEq total) by mouth daily. 30 tablet 12   No current facility-administered medications for this visit.     OBJECTIVE: [name] Using a tripod walking cane Vitals:   04/09/16 1348  BP: (!) 139/57  Pulse: 87  Resp: 18  Temp: 98.4 F (36.9 C)     Body mass index is 31.59 kg/m.    ECOG FS: 1 Filed Weights   04/09/16 1348  Weight: 213 lb 14.4 oz (97 kg)    Sclerae unicteric, pupils round and equal, EOMs intact Oropharynx clear and moist-- no thrush or other lesions No cervical or supraclavicular adenopathy Lungs no rales or rhonchi Heart regular rate and rhythm Abd soft, nontender, positive bowel sounds MSK no focal spinal tenderness, no upper extremity lymphedema; left lower extremity bruising but no significant edema  Neuro: nonfocal, well oriented, appropriate affect Breasts: Status post bilateral mastectomies, with no evidence of chest wall recurrence. Both axillae are benign.    LAB RESULTS: CBC Latest Ref Rng & Units 04/09/2016 03/18/2016 02/26/2016  WBC 3.9 - 10.3 10e3/uL 6.2 4.0 4.3  Hemoglobin 11.6 - 15.9 g/dL 11.5(L) 12.0 12.2  Hematocrit 34.8 - 46.6 % 34.8 36.2 37.0  Platelets 145 - 400 10e3/uL 154 147 142(L)   CMP Latest Ref Rng & Units 04/09/2016 03/18/2016 02/26/2016  Glucose 70 - 140 mg/dl 99 90 101  BUN 7.0 - 26.0 mg/dL 10.8 14.9 8.8  Creatinine 0.6  - 1.1 mg/dL 0.7 0.7 0.7  Sodium 136 - 145 mEq/L 142 141 142  Potassium 3.5 - 5.1 mEq/L 3.7 3.6 3.8  Chloride 101 - 111 mmol/L - - -  CO2 22 - 29 mEq/L 30(H) 28 30(H)  Calcium 8.4 - 10.4 mg/dL 9.8 9.4 9.7  Total Protein 6.4 - 8.3 g/dL 7.8 7.6 7.7  Total Bilirubin 0.20 - 1.20  mg/dL 1.18 0.94 0.95  Alkaline Phos 40 - 150 U/L 141 136 123  AST 5 - 34 U/L 44(H) 54(H) 54(H)  ALT 0 - 55 U/L 34 41 41    STUDIES: No results found.  ASSESSMENT: 70 y.o.  [place] woman   (1)  status post bilateral breast biopsies 07/24/2011, showing,      (a) on the right, a clinical T2 N0 papillary/ductal breast cancer, estrogen and progesterone receptor positive, HER-2 negative, with an MIB-1 of 10%;     (b) on the left, a clinical T3 N1, stage IIIA invasive ductal carcinoma, grade 3, triple positive, with an MIB-1 of 60%.  (2)  Status post 4 dose dense cycles of doxorubicin/ cyclophosphamide followed by 4 dose dense cycles of paclitaxel and trastuzumab completed 12/09/2011  (3) the trastuzumab was continued for a total of one year (to 11/01/2012). Final echo on 11/04/2012 showing a well preserved ejection fraction of 55-60%.  (4) s/p bilateral mastectomies 01/21/2012 showing   (a) on the Right, an 8 mm invasive papillary carcinoma, grade 1, ypT1b ypN0   (b) on the Left, multiple microscopic foci of residual  Invasive ductal carcinoma with evidence of dermal lymphatic involvement, pyT1a/T4 pyN0  (5)  Postmastectomy radiation, completed 04/15/2012  (6) Started anastrazole 04/16/2012; normal dexa scan 05/19/2014 at the [facility] 09/11/2014: brain, bones, left effusion (7) CT angiogram 09/11/2014 shows new left pericardial effusion and new mediastinal and hilar lymphadenopathy; there were no suspicious upper abdominal findings; Cytology from the left effusion 09/11/2014 ([id number]) shows malignant cells which are HER-2 positive  (8) Whole body bone scan on 09/25/14 showed  metastatic foci throughout axial and appendicular skeleton. Areas of most potential concern include disease in the thoracic spine, disease in the right acetabulum, right superior pubic ramus and possibly proximal right femur, disease in the right humeral shaft and in both femoral shafts.  (8) Started trastuzumab/pertuzumab 09/26/2014, discontinued after 11/29/2014 dose, w progression  (a) T-DM1 started 01/02/2015  (b) echocardiogram 08/24/2017shows an ejection fraction of 60-65%  (9) Brain MRI on 09/25/14 was positive for numerous small enhancing brain mets and calvarium/bone mets   (a) whole brain radiation on 10/09/14--10/27/2014  (b) brain MRI 01/07/2015 shows a complete clinical response  (c) REPEAT BRAIN mri 03/18/2015 SHOWS NO NEW LESIONS  (d) repeat brain MRI with and without contrast 06/29/2015 continues to show no recurrent lesions in the brain  (e) brain MRI 10/12/2015 shows no evidence of recurrent brain metastases. She does have a large left mastoid effusion with trace right mastoid fluid.  (f) brain MRI 02/12/2016 stable--no evidence of active disease  (10) started zolendronate 11/29/2014, to be repeated every 12 weeks  11) left pleural effusion re-tapped 12/08/2014 (a) left chest tube placement 12/14/2014, removedd 12/21/2014  (12) large pericardial effusion per echo 12/13/2014  (a) pericardial window placement 12/14/2014; fluid is hemorrhagic; path negative  (13) Right-sided pulmonary emboli noted on CT scan 12/13/2014-- never anticoagulated  (a) no pulmonary emboli demonstrated on non-dedicated chest CT 04/13/2015  PLAN: Linda [name] is now a year and a half out from diagnosis of metastatic breast cancer, with no evidence of disease activity. This is very favorable.  She is tolerating her treatments well and specifically she is not having hot flashes or vaginal dryness from the anastrozole, no arthralgias or myalgias from that drug or from the zolendronate,  and only minimal if any fatigue from the T-DM 1  Accordingly we are continuing with treatment as before. She will need to  be completely restaged in December and I have entered those orders. She will see me again December 6. If all is well I will start seeing her on an every three-month basis from that point  Note that her 06/11/2016 treatment will be postponed to 06/18/2016.  We discussed fall risk factors and I encouraged her to continue to use her walking cane. I also gave her information on the [employer] program  She knows to call for any problems that may develop before the next visit. Chauncey Cruel, MD

## 2016-04-09 NOTE — Addendum Note (Signed)
Addended by: Chauncey Cruel on: 04/09/2016 03:18 PM   Modules accepted: Orders

## 2016-04-09 NOTE — Patient Instructions (Signed)
[facility] Discharge Instructions for Patients Receiving Chemotherapy  Today you received the following chemotherapy agents: Kadcyla  To help prevent nausea and vomiting after your treatment, we encourage you to take your nausea medication as directed.    If you develop nausea and vomiting that is not controlled by your nausea medication, call the clinic.   BELOW ARE SYMPTOMS THAT SHOULD BE REPORTED IMMEDIATELY:  *FEVER GREATER THAN 100.5 F  *CHILLS WITH OR WITHOUT FEVER  NAUSEA AND VOMITING THAT IS NOT CONTROLLED WITH YOUR NAUSEA MEDICATION  *UNUSUAL SHORTNESS OF BREATH  *UNUSUAL BRUISING OR BLEEDING  TENDERNESS IN MOUTH AND THROAT WITH OR WITHOUT PRESENCE OF ULCERS  *URINARY PROBLEMS  *BOWEL PROBLEMS  UNUSUAL RASH Items with * indicate a potential emergency and should be followed up as soon as possible.  Feel free to call the clinic you have any questions or concerns. The clinic phone number is [phone].  Please show the CHEMO ALERT CARD at check-in to the Emergency Department and triage nurse.   

## 2016-04-22 ENCOUNTER — Other Ambulatory Visit: Payer: Self-pay | Admitting: Oncology

## 2016-04-22 DIAGNOSIS — C50911 Malignant neoplasm of unspecified site of right female breast: Secondary | ICD-10-CM

## 2016-04-22 MED FILL — ANASTROZOLE 1 MG TABLET: 1 | 30 days supply | Qty: 30 | Fill #0

## 2016-04-28 ENCOUNTER — Telehealth: Payer: Self-pay | Admitting: *Deleted

## 2016-04-28 NOTE — Telephone Encounter (Signed)
This RN spoke with Cari in central scheduling per her call stating multiple attempts to reach pt have been unsuccessful including her contact with emergency contact - per emergency contact ( her son ) - who states he has given pt request to return call to his mother.  Pt is scheduled for MD follow up 11/14.  This note will be given to MD- for communication per visit scheduled visit.

## 2016-04-29 ENCOUNTER — Other Ambulatory Visit: Payer: Self-pay | Admitting: Oncology

## 2016-04-29 ENCOUNTER — Other Ambulatory Visit (HOSPITAL_BASED_OUTPATIENT_CLINIC_OR_DEPARTMENT_OTHER): Payer: Medicare Other

## 2016-04-29 ENCOUNTER — Ambulatory Visit (HOSPITAL_BASED_OUTPATIENT_CLINIC_OR_DEPARTMENT_OTHER): Payer: Medicare Other

## 2016-04-29 VITALS — BP 136/70 | HR 72 | Temp 98.0°F | Resp 18

## 2016-04-29 DIAGNOSIS — C50911 Malignant neoplasm of unspecified site of right female breast: Secondary | ICD-10-CM

## 2016-04-29 DIAGNOSIS — J910 Malignant pleural effusion: Secondary | ICD-10-CM

## 2016-04-29 DIAGNOSIS — C50912 Malignant neoplasm of unspecified site of left female breast: Secondary | ICD-10-CM

## 2016-04-29 DIAGNOSIS — C50919 Malignant neoplasm of unspecified site of unspecified female breast: Secondary | ICD-10-CM

## 2016-04-29 DIAGNOSIS — Z5112 Encounter for antineoplastic immunotherapy: Secondary | ICD-10-CM

## 2016-04-29 DIAGNOSIS — C7800 Secondary malignant neoplasm of unspecified lung: Secondary | ICD-10-CM

## 2016-04-29 DIAGNOSIS — C7931 Secondary malignant neoplasm of brain: Secondary | ICD-10-CM

## 2016-04-29 DIAGNOSIS — C7951 Secondary malignant neoplasm of bone: Secondary | ICD-10-CM

## 2016-04-29 LAB — COMPREHENSIVE METABOLIC PANEL
ALT: 37 U/L (ref 0–55)
AST: 52 U/L — ABNORMAL HIGH (ref 5–34)
Albumin: 3.1 g/dL — ABNORMAL LOW (ref 3.5–5.0)
Alkaline Phosphatase: 142 U/L (ref 40–150)
Anion Gap: 9 mEq/L (ref 3–11)
BILIRUBIN TOTAL: 0.89 mg/dL (ref 0.20–1.20)
BUN: 16 mg/dL (ref 7.0–26.0)
CO2: 30 meq/L — AB (ref 22–29)
Calcium: 10.5 mg/dL — ABNORMAL HIGH (ref 8.4–10.4)
Chloride: 105 mEq/L (ref 98–109)
Creatinine: 0.8 mg/dL (ref 0.6–1.1)
EGFR: 81 mL/min/{1.73_m2} — AB (ref >90)
GLUCOSE: 118 mg/dL (ref 70–140)
Potassium: 3.6 mEq/L (ref 3.5–5.1)
SODIUM: 143 meq/L (ref 136–145)
TOTAL PROTEIN: 7.8 g/dL (ref 6.4–8.3)

## 2016-04-29 LAB — CBC WITH DIFFERENTIAL/PLATELET
BASO%: 0.6 % (ref 0.0–2.0)
Basophils Absolute: 0 10*3/uL (ref 0.0–0.1)
EOS%: 1.7 % (ref 0.0–7.0)
Eosinophils Absolute: 0.1 10*3/uL (ref 0.0–0.5)
HCT: 35.6 % (ref 34.8–46.6)
HGB: 11.5 g/dL — ABNORMAL LOW (ref 11.6–15.9)
LYMPH%: 31.6 % (ref 14.0–49.7)
MCH: 31.6 pg (ref 25.1–34.0)
MCHC: 32.3 g/dL (ref 31.5–36.0)
MCV: 97.8 fL (ref 79.5–101.0)
MONO#: 0.6 10*3/uL (ref 0.1–0.9)
MONO%: 13.2 % (ref 0.0–14.0)
NEUT%: 52.9 % (ref 38.4–76.8)
NEUTROS ABS: 2.4 10*3/uL (ref 1.5–6.5)
Platelets: 149 10*3/uL (ref 145–400)
RBC: 3.64 10*6/uL — AB (ref 3.70–5.45)
RDW: 16 % — ABNORMAL HIGH (ref 11.2–14.5)
WBC: 4.6 10*3/uL (ref 3.9–10.3)
lymph#: 1.5 10*3/uL (ref 0.9–3.3)

## 2016-04-29 MED ORDER — DIPHENHYDRAMINE HCL 25 MG PO CAPS
25.0000 mg | ORAL_CAPSULE | Freq: Once | ORAL | Status: AC
  Administered 2016-04-29: 25 mg via ORAL

## 2016-04-29 MED ORDER — DIPHENHYDRAMINE HCL 25 MG PO CAPS
ORAL_CAPSULE | ORAL | Status: AC
  Filled 2016-04-29: qty 1

## 2016-04-29 MED ORDER — ACETAMINOPHEN 325 MG PO TABS
ORAL_TABLET | ORAL | Status: AC
  Filled 2016-04-29: qty 2

## 2016-04-29 MED ORDER — ZOLEDRONIC ACID 4 MG/100ML IV SOLN
4.0000 mg | Freq: Once | INTRAVENOUS | Status: AC
  Administered 2016-04-29: 4 mg via INTRAVENOUS
  Filled 2016-04-29: qty 100

## 2016-04-29 MED ORDER — SODIUM CHLORIDE 0.9 % IJ SOLN
10.0000 mL | INTRAMUSCULAR | Status: DC | PRN
  Administered 2016-04-29: 10 mL
  Filled 2016-04-29: qty 10

## 2016-04-29 MED ORDER — ADO-TRASTUZUMAB EMTANSINE CHEMO INJECTION 160 MG
3.5000 mg/kg | Freq: Once | INTRAVENOUS | Status: AC
  Administered 2016-04-29: 360 mg via INTRAVENOUS
  Filled 2016-04-29: qty 10

## 2016-04-29 MED ORDER — HEPARIN SOD (PORK) LOCK FLUSH 100 UNIT/ML IV SOLN
500.0000 [IU] | Freq: Once | INTRAVENOUS | Status: AC | PRN
  Administered 2016-04-29: 500 [IU]
  Filled 2016-04-29: qty 5

## 2016-04-29 MED ORDER — ACETAMINOPHEN 325 MG PO TABS
650.0000 mg | ORAL_TABLET | Freq: Once | ORAL | Status: AC
  Administered 2016-04-29: 650 mg via ORAL

## 2016-04-29 MED ORDER — SODIUM CHLORIDE 0.9 % IV SOLN
Freq: Once | INTRAVENOUS | Status: AC
  Administered 2016-04-29: 13:00:00 via INTRAVENOUS

## 2016-04-29 NOTE — Patient Instructions (Signed)
[facility] Discharge Instructions for Patients Receiving Chemotherapy  Today you received the following chemotherapy agents: Herceptin.  To help prevent nausea and vomiting after your treatment, we encourage you to take your nausea medication as directed. If you develop nausea and vomiting that is not controlled by your nausea medication, call the clinic.   BELOW ARE SYMPTOMS THAT SHOULD BE REPORTED IMMEDIATELY:  *FEVER GREATER THAN 100.5 F  *CHILLS WITH OR WITHOUT FEVER  NAUSEA AND VOMITING THAT IS NOT CONTROLLED WITH YOUR NAUSEA MEDICATION  *UNUSUAL SHORTNESS OF BREATH  *UNUSUAL BRUISING OR BLEEDING  TENDERNESS IN MOUTH AND THROAT WITH OR WITHOUT PRESENCE OF ULCERS  *URINARY PROBLEMS  *BOWEL PROBLEMS  UNUSUAL RASH Items with * indicate a potential emergency and should be followed up as soon as possible.  Feel free to call the clinic you have any questions or concerns. The clinic phone number is [phone].  Please show the [facility] at check-in to the Emergency Department and triage nurse.  Zoledronic Acid injection (Hypercalcemia, Oncology) What is this medicine? ZOLEDRONIC ACID (ZOE le dron ik AS id) lowers the amount of calcium loss from bone. It is used to treat too much calcium in your blood from cancer. It is also used to prevent complications of cancer that has spread to the bone. This medicine may be used for other purposes; ask your health care provider or pharmacist if you have questions. COMMON BRAND NAME(S): Zometa What should I tell my health care provider before I take this medicine? They need to know if you have any of these conditions: -aspirin-sensitive asthma -cancer, especially if you are receiving medicines used to treat cancer -dental disease or wear dentures -infection -kidney disease -receiving corticosteroids like dexamethasone or prednisone -an unusual or allergic reaction to zoledronic acid, other medicines, foods,  dyes, or preservatives -pregnant or trying to get pregnant -breast-feeding How should I use this medicine? This medicine is for infusion into a vein. It is given by a health care professional in a hospital or clinic setting. Talk to your pediatrician regarding the use of this medicine in children. Special care may be needed. Overdosage: If you think you have taken too much of this medicine contact a poison control center or emergency room at once. NOTE: This medicine is only for you. Do not share this medicine with others. What if I miss a dose? It is important not to miss your dose. Call your doctor or health care professional if you are unable to keep an appointment. What may interact with this medicine? -certain antibiotics given by injection -NSAIDs, medicines for pain and inflammation, like ibuprofen or naproxen -some diuretics like bumetanide, furosemide -teriparatide -thalidomide This list may not describe all possible interactions. Give your health care provider a list of all the medicines, herbs, non-prescription drugs, or dietary supplements you use. Also tell them if you smoke, drink alcohol, or use illegal drugs. Some items may interact with your medicine. What should I watch for while using this medicine? Visit your doctor or health care professional for regular checkups. It may be some time before you see the benefit from this medicine. Do not stop taking your medicine unless your doctor tells you to. Your doctor may order blood tests or other tests to see how you are doing. Women should inform their doctor if they wish to become pregnant or think they might be pregnant. There is a potential for serious side effects to an unborn child. Talk to your health care  professional or pharmacist for more information. You should make sure that you get enough calcium and vitamin D while you are taking this medicine. Discuss the foods you eat and the vitamins you take with your health care  professional. Some people who take this medicine have severe bone, joint, and/or muscle pain. This medicine may also increase your risk for jaw problems or a broken thigh bone. Tell your doctor right away if you have severe pain in your jaw, bones, joints, or muscles. Tell your doctor if you have any pain that does not go away or that gets worse. Tell your dentist and dental surgeon that you are taking this medicine. You should not have major dental surgery while on this medicine. See your dentist to have a dental exam and fix any dental problems before starting this medicine. Take good care of your teeth while on this medicine. Make sure you see your dentist for regular follow-up appointments. What side effects may I notice from receiving this medicine? Side effects that you should report to your doctor or health care professional as soon as possible: -allergic reactions like skin rash, itching or hives, swelling of the face, lips, or tongue -anxiety, confusion, or depression -breathing problems -changes in vision -eye pain -feeling faint or lightheaded, falls -jaw pain, especially after dental work -mouth sores -muscle cramps, stiffness, or weakness -redness, blistering, peeling or loosening of the skin, including inside the mouth -trouble passing urine or change in the amount of urine Side effects that usually do not require medical attention (report to your doctor or health care professional if they continue or are bothersome): -bone, joint, or muscle pain -constipation -diarrhea -fever -hair loss -irritation at site where injected -loss of appetite -nausea, vomiting -stomach upset -trouble sleeping -trouble swallowing -weak or tired This list may not describe all possible side effects. Call your doctor for medical advice about side effects. You may report side effects to FDA at 1-800-FDA-1088. Where should I keep my medicine? This drug is given in a hospital or clinic and will not  be stored at home. NOTE: This sheet is a summary. It may not cover all possible information. If you have questions about this medicine, talk to your doctor, pharmacist, or health care provider.  2017 Elsevier/Gold Standard (2013-10-29 14:19:39)

## 2016-05-06 ENCOUNTER — Ambulatory Visit (HOSPITAL_COMMUNITY)
Admission: RE | Admit: 2016-05-06 | Discharge: 2016-05-06 | Disposition: A | Discharge status: Home / Self Care | Payer: Medicare Other | Source: Ambulatory Visit | Attending: Oncology | Admitting: Oncology

## 2016-05-06 DIAGNOSIS — C50812 Malignant neoplasm of overlapping sites of left female breast: Secondary | ICD-10-CM | POA: Insufficient documentation

## 2016-05-06 DIAGNOSIS — Z170 Estrogen receptor positive status [ER+]: Secondary | ICD-10-CM | POA: Insufficient documentation

## 2016-05-06 DIAGNOSIS — C7931 Secondary malignant neoplasm of brain: Secondary | ICD-10-CM

## 2016-05-06 DIAGNOSIS — K8020 Calculus of gallbladder without cholecystitis without obstruction: Secondary | ICD-10-CM | POA: Insufficient documentation

## 2016-05-06 DIAGNOSIS — C7951 Secondary malignant neoplasm of bone: Secondary | ICD-10-CM | POA: Insufficient documentation

## 2016-05-06 DIAGNOSIS — C50911 Malignant neoplasm of unspecified site of right female breast: Secondary | ICD-10-CM

## 2016-05-06 DIAGNOSIS — I700 Atherosclerosis of aorta: Secondary | ICD-10-CM | POA: Insufficient documentation

## 2016-05-06 DIAGNOSIS — C50811 Malignant neoplasm of overlapping sites of right female breast: Secondary | ICD-10-CM | POA: Diagnosis not present

## 2016-05-06 DIAGNOSIS — I2510 Atherosclerotic heart disease of native coronary artery without angina pectoris: Secondary | ICD-10-CM | POA: Insufficient documentation

## 2016-05-06 DIAGNOSIS — C7800 Secondary malignant neoplasm of unspecified lung: Secondary | ICD-10-CM | POA: Diagnosis present

## 2016-05-06 MED ORDER — IOPAMIDOL (ISOVUE-300) INJECTION 61%
75.0000 mL | Freq: Once | INTRAVENOUS | Status: AC | PRN
  Administered 2016-05-06: 75 mL via INTRAVENOUS

## 2016-05-06 MED ORDER — IOPAMIDOL (ISOVUE-300) INJECTION 61%
INTRAVENOUS | Status: AC
  Filled 2016-05-06: qty 75

## 2016-05-06 MED ORDER — SODIUM CHLORIDE 0.9 % IJ SOLN
INTRAMUSCULAR | Status: DC
Start: 2016-05-06 — End: 2016-05-07
  Filled 2016-05-06: qty 50

## 2016-05-07 ENCOUNTER — Ambulatory Visit (HOSPITAL_COMMUNITY)
Admission: RE | Admit: 2016-05-07 | Discharge: 2016-05-07 | Disposition: A | Discharge status: Home / Self Care | Payer: Medicare Other | Source: Ambulatory Visit | Attending: Oncology | Admitting: Oncology

## 2016-05-07 DIAGNOSIS — Z170 Estrogen receptor positive status [ER+]: Secondary | ICD-10-CM | POA: Diagnosis not present

## 2016-05-07 DIAGNOSIS — C50911 Malignant neoplasm of unspecified site of right female breast: Secondary | ICD-10-CM | POA: Diagnosis not present

## 2016-05-07 DIAGNOSIS — I5189 Other ill-defined heart diseases: Secondary | ICD-10-CM | POA: Insufficient documentation

## 2016-05-07 DIAGNOSIS — C50811 Malignant neoplasm of overlapping sites of right female breast: Secondary | ICD-10-CM | POA: Insufficient documentation

## 2016-05-07 DIAGNOSIS — C7931 Secondary malignant neoplasm of brain: Secondary | ICD-10-CM | POA: Insufficient documentation

## 2016-05-07 DIAGNOSIS — C7800 Secondary malignant neoplasm of unspecified lung: Secondary | ICD-10-CM | POA: Diagnosis not present

## 2016-05-07 DIAGNOSIS — I340 Nonrheumatic mitral (valve) insufficiency: Secondary | ICD-10-CM | POA: Insufficient documentation

## 2016-05-07 DIAGNOSIS — C50812 Malignant neoplasm of overlapping sites of left female breast: Secondary | ICD-10-CM | POA: Diagnosis not present

## 2016-05-07 NOTE — Progress Notes (Signed)
  Echocardiogram 2D Echocardiogram has been performed.  Tresa Res 05/07/2016, 9:57 AM

## 2016-05-19 ENCOUNTER — Ambulatory Visit (HOSPITAL_COMMUNITY)
Admission: RE | Admit: 2016-05-19 | Discharge: 2016-05-19 | Disposition: A | Discharge status: Home / Self Care | Payer: Medicare Other | Source: Ambulatory Visit | Attending: Oncology | Admitting: Oncology

## 2016-05-19 DIAGNOSIS — C50811 Malignant neoplasm of overlapping sites of right female breast: Secondary | ICD-10-CM

## 2016-05-19 DIAGNOSIS — C50911 Malignant neoplasm of unspecified site of right female breast: Secondary | ICD-10-CM

## 2016-05-19 DIAGNOSIS — Z170 Estrogen receptor positive status [ER+]: Secondary | ICD-10-CM | POA: Insufficient documentation

## 2016-05-19 DIAGNOSIS — C7800 Secondary malignant neoplasm of unspecified lung: Secondary | ICD-10-CM | POA: Diagnosis present

## 2016-05-19 DIAGNOSIS — C7931 Secondary malignant neoplasm of brain: Secondary | ICD-10-CM | POA: Diagnosis present

## 2016-05-19 DIAGNOSIS — C50812 Malignant neoplasm of overlapping sites of left female breast: Secondary | ICD-10-CM | POA: Insufficient documentation

## 2016-05-19 MED ORDER — GADOBENATE DIMEGLUMINE 529 MG/ML IV SOLN
20.0000 mL | Freq: Once | INTRAVENOUS | Status: AC | PRN
  Administered 2016-05-19: 20 mL via INTRAVENOUS

## 2016-05-21 ENCOUNTER — Ambulatory Visit (HOSPITAL_BASED_OUTPATIENT_CLINIC_OR_DEPARTMENT_OTHER): Payer: Medicare Other | Admitting: Oncology

## 2016-05-21 ENCOUNTER — Ambulatory Visit (HOSPITAL_BASED_OUTPATIENT_CLINIC_OR_DEPARTMENT_OTHER): Payer: Medicare Other

## 2016-05-21 ENCOUNTER — Other Ambulatory Visit (HOSPITAL_BASED_OUTPATIENT_CLINIC_OR_DEPARTMENT_OTHER): Payer: Medicare Other

## 2016-05-21 VITALS — BP 180/65 | HR 73 | Temp 97.5°F | Ht 69.0 in | Wt 217.9 lb

## 2016-05-21 VITALS — BP 147/67

## 2016-05-21 DIAGNOSIS — C7800 Secondary malignant neoplasm of unspecified lung: Secondary | ICD-10-CM

## 2016-05-21 DIAGNOSIS — C50811 Malignant neoplasm of overlapping sites of right female breast: Secondary | ICD-10-CM

## 2016-05-21 DIAGNOSIS — C7951 Secondary malignant neoplasm of bone: Secondary | ICD-10-CM

## 2016-05-21 DIAGNOSIS — C50911 Malignant neoplasm of unspecified site of right female breast: Secondary | ICD-10-CM

## 2016-05-21 DIAGNOSIS — Z170 Estrogen receptor positive status [ER+]: Secondary | ICD-10-CM

## 2016-05-21 DIAGNOSIS — Z5112 Encounter for antineoplastic immunotherapy: Secondary | ICD-10-CM | POA: Diagnosis present

## 2016-05-21 DIAGNOSIS — C50812 Malignant neoplasm of overlapping sites of left female breast: Secondary | ICD-10-CM

## 2016-05-21 DIAGNOSIS — C50912 Malignant neoplasm of unspecified site of left female breast: Secondary | ICD-10-CM | POA: Diagnosis not present

## 2016-05-21 DIAGNOSIS — Z86711 Personal history of pulmonary embolism: Secondary | ICD-10-CM

## 2016-05-21 DIAGNOSIS — J910 Malignant pleural effusion: Secondary | ICD-10-CM

## 2016-05-21 DIAGNOSIS — C50919 Malignant neoplasm of unspecified site of unspecified female breast: Secondary | ICD-10-CM

## 2016-05-21 DIAGNOSIS — C7931 Secondary malignant neoplasm of brain: Secondary | ICD-10-CM

## 2016-05-21 LAB — COMPREHENSIVE METABOLIC PANEL
ALT: 35 U/L (ref 0–55)
AST: 51 U/L — AB (ref 5–34)
Albumin: 3.1 g/dL — ABNORMAL LOW (ref 3.5–5.0)
Alkaline Phosphatase: 143 U/L (ref 40–150)
Anion Gap: 9 mEq/L (ref 3–11)
BUN: 11.1 mg/dL (ref 7.0–26.0)
CHLORIDE: 103 meq/L (ref 98–109)
CO2: 30 meq/L — AB (ref 22–29)
Calcium: 9.8 mg/dL (ref 8.4–10.4)
Creatinine: 0.7 mg/dL (ref 0.6–1.1)
EGFR: 89 mL/min/{1.73_m2} — AB (ref >90)
GLUCOSE: 84 mg/dL (ref 70–140)
POTASSIUM: 3.8 meq/L (ref 3.5–5.1)
SODIUM: 142 meq/L (ref 136–145)
Total Bilirubin: 1.04 mg/dL (ref 0.20–1.20)
Total Protein: 8 g/dL (ref 6.4–8.3)

## 2016-05-21 LAB — CBC WITH DIFFERENTIAL/PLATELET
BASO%: 0.6 % (ref 0.0–2.0)
BASOS ABS: 0 10*3/uL (ref 0.0–0.1)
EOS ABS: 0.1 10*3/uL (ref 0.0–0.5)
EOS%: 2.5 % (ref 0.0–7.0)
HCT: 35.7 % (ref 34.8–46.6)
HGB: 11.8 g/dL (ref 11.6–15.9)
LYMPH%: 28.1 % (ref 14.0–49.7)
MCH: 31.5 pg (ref 25.1–34.0)
MCHC: 32.9 g/dL (ref 31.5–36.0)
MCV: 95.7 fL (ref 79.5–101.0)
MONO#: 0.8 10*3/uL (ref 0.1–0.9)
MONO%: 14.5 % — AB (ref 0.0–14.0)
NEUT#: 3 10*3/uL (ref 1.5–6.5)
NEUT%: 54.3 % (ref 38.4–76.8)
Platelets: 145 10*3/uL (ref 145–400)
RBC: 3.74 10*6/uL (ref 3.70–5.45)
RDW: 16 % — ABNORMAL HIGH (ref 11.2–14.5)
WBC: 5.6 10*3/uL (ref 3.9–10.3)
lymph#: 1.6 10*3/uL (ref 0.9–3.3)

## 2016-05-21 MED ORDER — ACETAMINOPHEN 325 MG PO TABS
650.0000 mg | ORAL_TABLET | Freq: Once | ORAL | Status: AC
  Administered 2016-05-21: 650 mg via ORAL

## 2016-05-21 MED ORDER — DIPHENHYDRAMINE HCL 25 MG PO CAPS
ORAL_CAPSULE | ORAL | Status: AC
  Filled 2016-05-21: qty 1

## 2016-05-21 MED ORDER — SODIUM CHLORIDE 0.9 % IV SOLN
Freq: Once | INTRAVENOUS | Status: AC
  Administered 2016-05-21: 14:00:00 via INTRAVENOUS

## 2016-05-21 MED ORDER — SODIUM CHLORIDE 0.9 % IJ SOLN
10.0000 mL | INTRAMUSCULAR | Status: DC | PRN
  Administered 2016-05-21: 10 mL
  Filled 2016-05-21: qty 10

## 2016-05-21 MED ORDER — DIPHENHYDRAMINE HCL 25 MG PO CAPS
25.0000 mg | ORAL_CAPSULE | Freq: Once | ORAL | Status: AC
  Administered 2016-05-21: 25 mg via ORAL

## 2016-05-21 MED ORDER — ACETAMINOPHEN 325 MG PO TABS
ORAL_TABLET | ORAL | Status: AC
  Filled 2016-05-21: qty 2

## 2016-05-21 MED ORDER — HEPARIN SOD (PORK) LOCK FLUSH 100 UNIT/ML IV SOLN
500.0000 [IU] | Freq: Once | INTRAVENOUS | Status: AC | PRN
  Administered 2016-05-21: 500 [IU]
  Filled 2016-05-21: qty 5

## 2016-05-21 MED ORDER — SODIUM CHLORIDE 0.9 % IV SOLN
3.5000 mg/kg | Freq: Once | INTRAVENOUS | Status: AC
  Administered 2016-05-21: 360 mg via INTRAVENOUS
  Filled 2016-05-21: qty 8

## 2016-05-21 NOTE — Patient Instructions (Signed)
[facility] Discharge Instructions for Patients Receiving Chemotherapy  Today you received the following chemotherapy agents: Kadcyla  To help prevent nausea and vomiting after your treatment, we encourage you to take your nausea medication as directed.    If you develop nausea and vomiting that is not controlled by your nausea medication, call the clinic.   BELOW ARE SYMPTOMS THAT SHOULD BE REPORTED IMMEDIATELY:  *FEVER GREATER THAN 100.5 F  *CHILLS WITH OR WITHOUT FEVER  NAUSEA AND VOMITING THAT IS NOT CONTROLLED WITH YOUR NAUSEA MEDICATION  *UNUSUAL SHORTNESS OF BREATH  *UNUSUAL BRUISING OR BLEEDING  TENDERNESS IN MOUTH AND THROAT WITH OR WITHOUT PRESENCE OF ULCERS  *URINARY PROBLEMS  *BOWEL PROBLEMS  UNUSUAL RASH Items with * indicate a potential emergency and should be followed up as soon as possible.  Feel free to call the clinic you have any questions or concerns. The clinic phone number is [phone].  Please show the CHEMO ALERT CARD at check-in to the Emergency Department and triage nurse.   

## 2016-05-21 NOTE — Progress Notes (Signed)
ID: GERMANY DODGEN   DOB: Jul 28, 1945  MR#: [id number]  WUJ#:[id number]  PCP: Leeanne Rio, PA-C GYN: SU: Coralie Keens OTHER MD: Crissie Reese, Linna Hoff Bensimhon  CHIEF COMPLAINT:  Bilateral Breast Cancers  CURRENT TREATMENT: Anastrozole, T-DM 1, zolendrnate  BREAST CANCER HISTORY: From the original intake note:  The patient noted a small amount of drainage from her left breast December of 2012. She brought it to her gynecologist's attention in January of 2013 and was set up for bilateral diagnostic mammography at the breast Center July 24, 2011. This was the patient's first ever mammogram. It showed a large irregular mass in the left retroareolar region extending to the nipple, with nipple retraction and skin thickening. This measured approximately 8.4 cm. It was associated with pleomorphic microcalcifications. By exam there was moderate distortion and retraction of the nipple with a large palpable ill-defined area in the retroareolar region. In the right right breast there was also a 2 cm hard mass palpated. Ultrasound of the right breast mass showed a complex cystic/solid area measuring 1.9 cm. Ultrasound of the right axilla was negative. Ultrasound of the left breast showed a large hypoechoic mass measuring at least 3.8 cm. The left axilla showed several adjacent abnormal appearing lymph nodes.  With this information biopsy of the right and left breast masses were obtained the same day, and showed ([id number])   (a) on the right, and invasive ductal carcinoma with papillary features, estrogen and progesterone receptor positive, HER-2 negative, with an MIB-1 of 10%.   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 The MIB-1 was 60% for the left-sided tumor.   With this information the patient was presented at the multidisciplinary breast cancer conference 08/06/2011. Subsequent  evaluation and treatments are as detailed below.  INTERVAL HISTORY: Linda [name] returns today for follow up of her triple positive breast cancer accompanied by her friend Butch Penny. Saralyn Pilar continues on anastrozole which she takes daily, with good tolerance. Hot flashes and vaginal dryness are not a major issue. She never developed the arthralgias or myalgias that many patients can experience on this medication. She obtains it at a good price.  She receives T-DM 1 every 3 weeks. She has no side effects from this except perhaps feeling a little tired half a day or so after receiving it. She just had a repeat echocardiogram which shows an excellent ejection fraction.  She also receives zolendronate every 3 months. She has no side effects from that that she is aware of.  REVIEW OF SYSTEMS: Linda [name] has been living at her son's [place] since September. HER-2 grandchildren aged 27 and 41 are their also. She enjoys all that a great deal. Sometimes she feels a little dizzy right after lying down or right after getting up from lying down but this last at most a few seconds. Recently she has been a little bit constipated. Aside from these issues a detailed review of systems today was noncontributory  PAST MEDICAL HISTORY: Past Medical History:  Diagnosis Date  . Acute on chronic diastolic CHF (congestive heart failure) ([place])   . Acute pulmonary embolism ([place]) 12/13/2014  . Allergy   . Blood transfusion without reported diagnosis   . Breast cancer ([place]) 07/30/11 dx   Right  invasive ductal ca 7 0'clock,& left breast=invasive ductal ca  and dcis, left axilla nlymph node, metastatic ca  . Bronchitis    hx  . Cancer ([place]) 08-13-11  07-31-11-Dx. Bilarteral Breast cancer-left greater than rt.  . Cancer of central portion of left female breast ([place]) 08/01/2011  . GERD (gastroesophageal reflux disease)    doing well  . Hematuria, undiagnosed cause 08-13-11   Being evaluated by [facility] 08-14-11  . History of  radiation therapy 02/20/12-04/15/12   left breast,total 60.4 Gy  . Hypertension   . Radiation-induced dermatitis 03/26/2012   Using radioplex cream, plus neosporin.   . S/P radiation therapy 10/09/14-10/27/14   whole brain 37.5Gy/38f  . Seasonal allergies   . Seroma 02/04/12   right breast  200cc removed  erythema on right side  . Wears partial dentures    wears upper and lower partial    PAST SURGICAL HISTORY: Past Surgical History:  Procedure Laterality Date  . BREAST SURGERY    . CHEST TUBE INSERTION Left 12/14/2014   Procedure: CHEST TUBE INSERTION;  Surgeon: CRexene Alberts MD;  [facility]  Service: Thoracic;  Laterality: Left;  . child birth  08-13-11   x3 -NVD  . MASTECTOMY W/ SENTINEL NODE BIOPSY  01/21/2012   Procedure: MASTECTOMY WITH SENTINEL LYMPH NODE BIOPSY;  Surgeon: DHarl Bowie MD;  [facility]  Service: General;  Laterality: Bilateral;  Left modified radical mastectomy, Rt mastectomy with Sentinel lymphnode biospy  . PORT-A-CATH REMOVAL Right 12/22/2012   Procedure: REMOVAL PORT-A-CATH;  Surgeon: DHarl Bowie MD;  [facility]  Service: General;  Laterality: Right;  . PORTACATH PLACEMENT  08/15/2011   Procedure: INSERTION PORT-A-CATH;  Surgeon: DHarl Bowie MD;  Location: [facility];  Service: General;  Laterality: N/A;  . SUBXYPHOID PERICARDIAL WINDOW N/A 12/14/2014   Procedure: SUBXYPHOID PERICARDIAL WINDOW;  Surgeon: CRexene Alberts MD;  [facility]  Service: Thoracic;  Laterality: N/A;  . TEE WITHOUT CARDIOVERSION N/A 12/14/2014   Procedure: TRANSESOPHAGEAL ECHOCARDIOGRAM (TEE);  Surgeon: CRexene Alberts MD;  [facility];  Service: Thoracic;  Laterality: N/A;    FAMILY HISTORY Family History  Problem Relation Age of Onset  . Heart disease Mother   . Cancer Mother 425   breast, , 748deceased  . Heart attack Father   . Cancer Sister 677   breast  . Colon cancer Neg Hx   The patient's  father died from a heart attack at the age of 828 The patient's mother died from apparently heart problems at the age of 827 The patient had no brothers. She has 3 sisters. One of her sisters was diagnosed with breast cancer in her mid 655s The patient does not know if his sister was ever genetically tested. The patient's mother had a mastectomy at the age of 354 presumably for breast cancer. There is no other history of breast or in cancer in the family to her knowledge.   GYNECOLOGIC HISTORY:  She does not recall when she had menarche. She had her first child at age 70 She is GX P3. She underwent menopause in her mid 428s She never took hormone replacement.   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 She moved in with her son CGerald StabsSept 2017--[place] f[place]line pilot His wife is a nMarine scientist Son DShanon Browlives at [employer]and is a dAdvertising account executivein addition to having the m[employer] The patient attends a  local [employer] here   ADVANCED DIRECTIVES: Not in place.  Patient has been given the appropriate forms to complete and notarize at her discretion. She tells me she ihas named her daughter-in-law Jacqlyn Larsen as her healthcare power of attorney  HEALTH MAINTENANCE: Social History  Substance Use Topics  . Smoking status: Never Smoker  . Smokeless tobacco: Never Used  . Alcohol use 0.0 oz/week     Comment: rare- occ.     Colonoscopy: Never  PAP: Feb 2013  Bone density: November 2013, normal  Lipid panel:   Allergies  Allergen Reactions  . Ace Inhibitors Cough    Current Outpatient Prescriptions  Medication Sig Dispense Refill  . anastrozole (ARIMIDEX) 1 MG tablet Take 1 tablet (1 mg total) by mouth daily. 30 tablet 3  . anastrozole (ARIMIDEX) 1 MG tablet TAKE 1 TABLET BY MOUTH ONCE DAILY 30 tablet 3  . b complex vitamins  tablet Take 1 tablet by mouth daily.    . cholecalciferol (VITAMIN D) 1000 UNITS tablet Take 1 tablet (1,000 Units total) by mouth daily. 100 tablet 12  . docusate sodium (COLACE) 100 MG capsule Take 1 capsule (100 mg total) by mouth 2 (two) times daily. 10 capsule 0  . furosemide (LASIX) 20 MG tablet Take 1 tablet (20 mg total) by mouth 2 (two) times a week. 30 tablet 12  . magnesium oxide (MAG-OX) 400 (241.3 Mg) MG tablet Take 1 tablet (400 mg total) by mouth daily. 30 tablet 12  . Multiple Vitamins-Iron (MULTIVITAMINS WITH IRON) TABS Take 1 tablet by mouth daily.    . potassium chloride SA (K-DUR,KLOR-CON) 20 MEQ tablet Take 1 tablet (20 mEq total) by mouth daily. 30 tablet 12   No current facility-administered medications for this visit.     OBJECTIVE: [name] In no acute distress Vitals:   05/21/16 1314  BP: (!) 180/65  Pulse: 73  Temp: 97.5 F (36.4 C)     Body mass index is 32.18 kg/m.    ECOG FS: 1 Filed Weights   05/21/16 1314  Weight: 217 lb 14.4 oz (98.8 kg)      Sclerae unicteric, pupils round and equal Oropharynx clear and moist-- no thrush or other lesions No cervical or supraclavicular adenopathy Lungs no rales or rhonchi Heart regular rate and rhythm Abd soft, nontender, positive bowel sounds MSK no focal spinal tenderness, no upper extremity lymphedema Neuro: nonfocal, well oriented, appropriate affect Breasts: Status post bilateral mastectomies. There is no evidence of local recurrence. Both axillae are benign.    LAB RESULTS: CBC Latest Ref Rng & Units 05/21/2016 04/29/2016 04/09/2016  WBC 3.9 - 10.3 10e3/uL 5.6 4.6 6.2  Hemoglobin 11.6 - 15.9 g/dL 11.8 11.5(L) 11.5(L)  Hematocrit 34.8 - 46.6 % 35.7 35.6 34.8  Platelets 145 - 400 10e3/uL 145 149 154   CMP Latest Ref Rng & Units 05/21/2016 04/29/2016 04/09/2016  Glucose 70 - 140 mg/dl 84 118 99  BUN 7.0 - 26.0 mg/dL 11.1 16.0 10.8  Creatinine 0.6 - 1.1 mg/dL 0.7 0.8 0.7  Sodium 136 -  145 mEq/L 142 143 142  Potassium 3.5 - 5.1 mEq/L 3.8 3.6 3.7  Chloride 101 - 111 mmol/L - - -  CO2 22 - 29 mEq/L 30(H) 30(H) 30(H)  Calcium 8.4 - 10.4 mg/dL 9.8 10.5(H) 9.8  Total Protein 6.4 - 8.3 g/dL 8.0 7.8 7.8  Total Bilirubin 0.20 - 1.20 mg/dL 1.04 0.89 1.18  Alkaline Phos 40 - 150 U/L 143 142 141  AST 5 -  34 U/L 51(H) 52(H) 44(H)  ALT 0 - 55 U/L 35 37 34    STUDIES: Ct Chest W Contrast  Result Date: 05/07/2016 CLINICAL DATA:  Triple positive breast cancer. Right breast primary with lung metastasis. Brain metastasis. EXAM: CT CHEST WITH CONTRAST TECHNIQUE: Multidetector CT imaging of the chest was performed during intravenous contrast administration. CONTRAST:  23m ISOVUE-300 IOPAMIDOL (ISOVUE-300) INJECTION 61% COMPARISON:  Plain films 08/21/2015.  CT 04/13/2015. FINDINGS: Cardiovascular: Artifact degradation from external breast prosthesis. Aortic and branch vessel atherosclerosis. Mild cardiomegaly, without pericardial effusion. Multivessel coronary artery atherosclerosis. A right Port-A-Cath which terminates at the mid right atrium. No central pulmonary embolism, on this non-dedicated study. Mediastinum/Nodes: Bilateral mastectomy. Axillary node dissection. No axillary adenopathy. No mediastinal or hilar adenopathy. Lungs/Pleura: No pleural fluid. Left upper lobe scarring which may be radiation induced. No pulmonary nodule or mass. No lobar consolidation. Upper Abdomen: Normal imaged portions of the liver, spleen, stomach, pancreas, adrenal glands, kidneys. Low-density gallstones. Musculoskeletal: Multifocal sclerotic osseous metastasis are again identified. Similar in severity and distribution. IMPRESSION: 1. No evidence of soft tissue metastasis within the chest. 2. Similar sclerotic osseous metastasis. 3. Cholelithiasis. 4.  Coronary artery atherosclerosis. Aortic atherosclerosis. Electronically Signed   By: KAbigail MiyamotoM.D.   On: 05/07/2016 11:03   Mr BJeri CosWZJContrast  Result  Date: 05/19/2016 CLINICAL DATA:  70year old female with history of breast cancer. Metastatic brain lesions noted 09/25/2014. Post whole-brain radiation therapy 10/09/2014 through 10/27/2014. EXAM: MRI HEAD WITHOUT AND WITH CONTRAST TECHNIQUE: Multiplanar, multiecho pulse sequences of the brain and surrounding structures were obtained without and with intravenous contrast. CONTRAST:  279mMULTIHANCE GADOBENATE DIMEGLUMINE 529 MG/ML IV SOLN COMPARISON:  None. FINDINGS: Brain: No acute infarct. Siderosis posterior fossa unchanged without new region of intracranial hemorrhage. No parenchymal enhancing lesion. Moderate white matter changes most consistent with result of treatment of tumor. Mild global atrophy without hydrocephalus. Vascular: Left vertebral artery ends in a posterior inferior cerebellar artery distribution. Major intracranial vascular structures are patent. Skull and upper cervical spine: Heterogeneous bone marrow with left frontal 11 mm lesion unchanged from prior exam possibly representing treated metastatic lesion. Sinuses/Orbits: No acute orbital abnormality. Persistent opacification left mastoid air cells, left middle ear and left petrous apex without obstructing lesion of the eustachian tube noted at the level of the posterior superior nasopharynx. Minimal partial opacification right mastoid air cells appear stable. Other: Negative. IMPRESSION: No parenchymal enhancing lesion. Heterogeneous bone marrow with left frontal calvarial 11 mm lesion unchanged from prior exam possibly representing treated metastatic lesion. Moderate white matter changes most consistent with result of treatment of tumor. Mild global atrophy. Persistent opacification left mastoid air cells, left middle ear and left petrous apex without obstructing lesion of the eustachian tube noted at the level of the posterior superior nasopharynx. Minimal partial opacification right mastoid air cells appear stable. No acute infarct or new  area of intracranial hemorrhage. Electronically Signed   By: StGenia Del.D.   On: 05/19/2016 14:14    ASSESSMENT: [age].o.  [place] woman   (1)  status post bilateral breast biopsies 07/24/2011, showing,     (a) on the right, a clinical T2 N0 papillary/ductal breast cancer, estrogen and progesterone receptor positive, HER-2 negative, with an MIB-1 of 10%;    (b) on the left, a clinical T3 N1, stage IIIA invasive ductal carcinoma, grade 3, triple positive, with an MIB-1 of 60%.  (2)  Status post 4 dose dense cycles of doxorubicin/ cyclophosphamide followed by 4  dose dense cycles of paclitaxel and trastuzumab completed 12/09/2011  (3) the trastuzumab was continued for a total of one year (to 11/01/2012). Final echo on 11/04/2012 showing a well preserved ejection fraction of 55-60%.  (4) s/p bilateral mastectomies 01/21/2012 showing  (a) on the Right, an 8 mm invasive papillary carcinoma, grade 1, ypT1b ypN0  (b) on the Left, multiple microscopic foci of residual  Invasive ductal carcinoma with evidence of dermal lymphatic involvement, pyT1a/T4 pyN0  (5)  Postmastectomy radiation, completed 04/15/2012  (6) Started anastrazole 04/16/2012; normal dexa scan 05/19/2014 at the [facility] 09/11/2014: brain, bones, left effusion (7) CT angiogram 09/11/2014 shows new left pericardial effusion and new mediastinal and hilar lymphadenopathy; there were no suspicious upper abdominal findings; Cytology from the left effusion 09/11/2014 ([id number]) shows malignant cells which are HER-2 positive  (8) Whole body bone scan on 09/25/14 showed metastatic foci throughout axial and appendicular skeleton. Areas of most potential concern include disease in the thoracic spine, disease in the right acetabulum, right superior pubic ramus and possibly proximal right femur, disease in the right humeral shaft and in both femoral shafts.  (8) Started trastuzumab/pertuzumab 09/26/2014,  discontinued after 11/29/2014 dose, w progression  (a) T-DM1 started 01/02/2015  (b) echocardiogram 111/22/2017shows an ejection fraction of 70%  (9) Brain MRI on 09/25/14 was positive for numerous small enhancing brain mets and calvarium/bone mets   (a) whole brain radiation on 10/09/14--10/27/2014  (b) brain MRI 01/07/2015 shows a complete clinical response  (c) REPEAT BRAIN mri 03/18/2015 SHOWS NO NEW LESIONS  (d) repeat brain MRI with and without contrast 06/29/2015 continues to show no recurrent lesions in the brain  (e) brain MRI 10/12/2015 shows no evidence of recurrent brain metastases. She does have a large left mastoid effusion with trace right mastoid fluid.  (f) brain MRI 02/12/2016 stable--no evidence of active disease  (g) brain MRI 05/19/2016 stable, no evidence of active disease  (10) started zolendronate 11/29/2014, repeated every 12 weeks  11)  left pleural effusion re-tapped 12/08/2014 (a) left chest tube placement 12/14/2014, removedd 12/21/2014  (12) large pericardial effusion per echo 12/13/2014  (a) pericardial window placement 12/14/2014; fluid is hemorrhagic; path negative  (13) Right-sided pulmonary emboli noted on CT scan 12/13/2014-- never anticoagulated  (a) no pulmonary emboli demonstrated on non-dedicated chest CT 04/13/2015  PLAN: Linda [name] will soon be 2 years out from definitive diagnosis of metastatic disease, particularly involving the brain, but with no evidence of clinical disease activity at present. This is very favorable.  More specifically a chest CT scan 05/06/2016 showed no pleural or pericardial effusion. Brain MRI just performed shows no evidence of recurrence in the brain.  She is tolerating the T-DM 1 with no side effects that she is aware of and repeat echocardiogram remains normal. She also does well with zolendronate, which she receives every 3 months. Finally she has no side effects that she is aware of from the  anastrozole she takes daily  The plan accordingly is to continue the current treatment indefinitely. After today her next treatment will be January 3 and that every 21 days. She will see me again in March. Before the March visit she will have another echocardiogram. I will probably completely restage her in 6 months  She knows to call for any problems that may develop before her next visit here.     Chauncey Cruel, MD

## 2016-05-22 MED FILL — FUROSEMIDE 20 MG TABLET: 20 | 84 days supply | Qty: 24 | Fill #3

## 2016-05-22 MED FILL — ANASTROZOLE 1 MG TABLET: 1 | 30 days supply | Qty: 30 | Fill #1

## 2016-06-10 IMAGING — CT CT ANGIO CHEST
2 of 9 series · 17 of 46 positions shown · IV contrast (omnipaque)
Comparison: Plain film 12/13/2014.  Chest CT of 09/11/2014

CLINICAL DATA: Shortness of breath and coughing. Worsening swelling
of left arm. Breast cancer diagnosed [DATE] with right mastectomy and
left modified radical mastectomy. Radiation therapy. Last
chemotherapy [DATE].

EXAM:
CT ANGIOGRAPHY CHEST WITH CONTRAST
TECHNIQUE: Multidetector CT imaging of the chest was performed using the
standard protocol during bolus administration of intravenous
contrast. Multiplanar CT image reconstructions and MIPs were
obtained to evaluate the vascular anatomy.
CONTRAST:  60mL OMNIPAQUE IOHEXOL 350 MG/ML SOLN

[Series 6: thins · axial · 0.71mm/px · z∈[+1361,+1590]mm · 14 of 259 slices shown]
[im 15/259  lung]
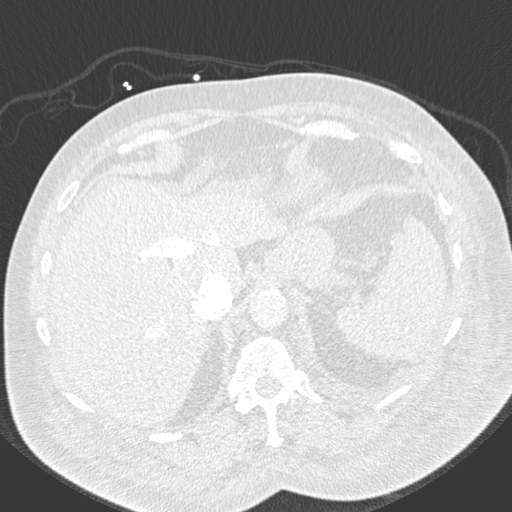
[im 29/259  soft-tissue]
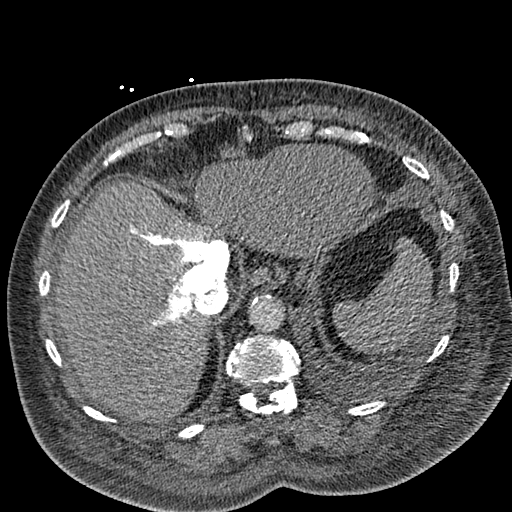
[im 58/259  lung]
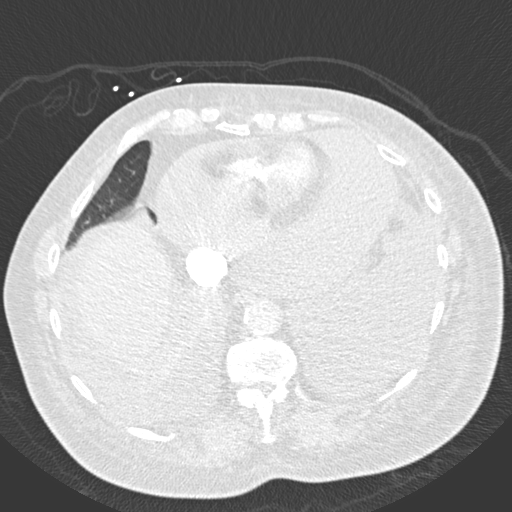
[im 72/259  soft-tissue]
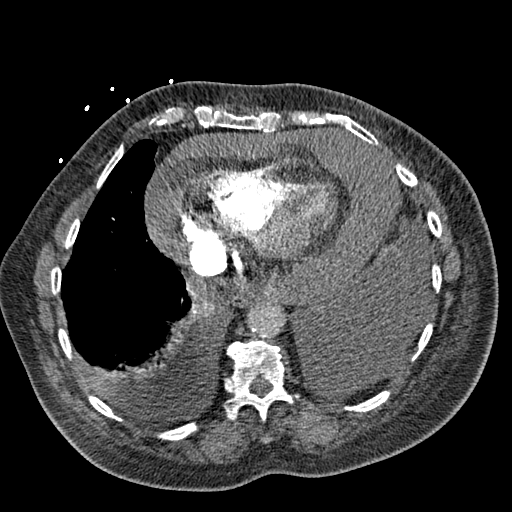
[im 87/259  lung]
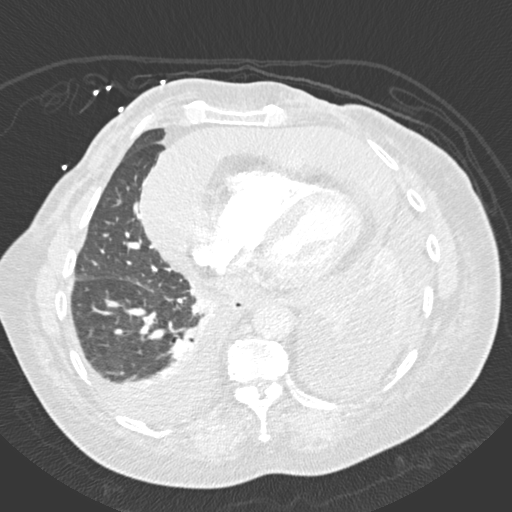
[im 101/259  soft-tissue]
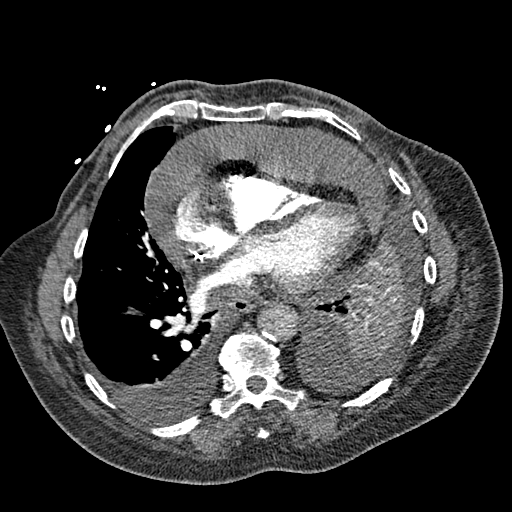
[im 115/259  lung]
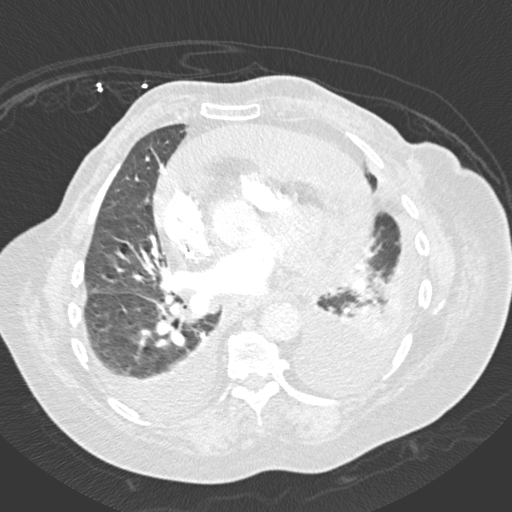
[im 144/259  soft-tissue]
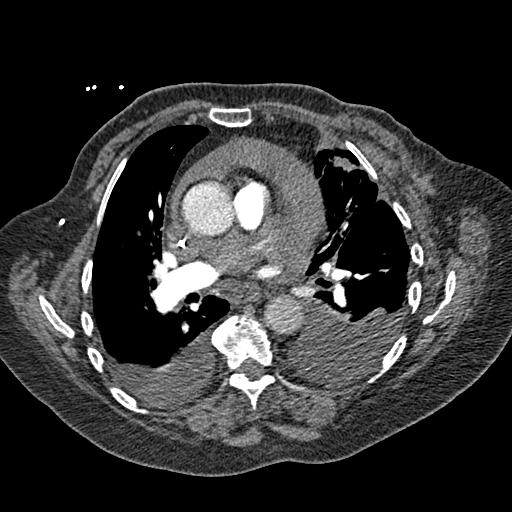
[im 158/259  lung]
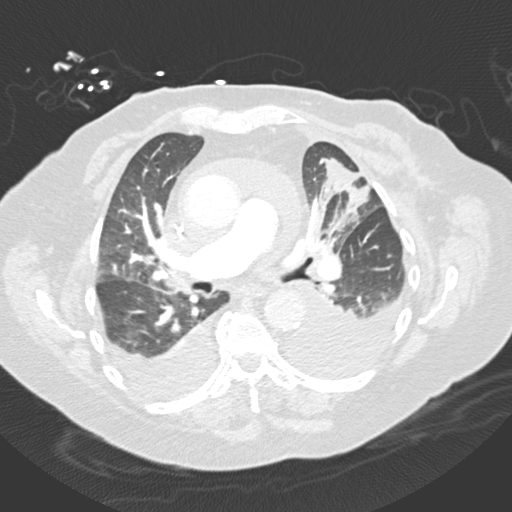
[im 173/259  soft-tissue]
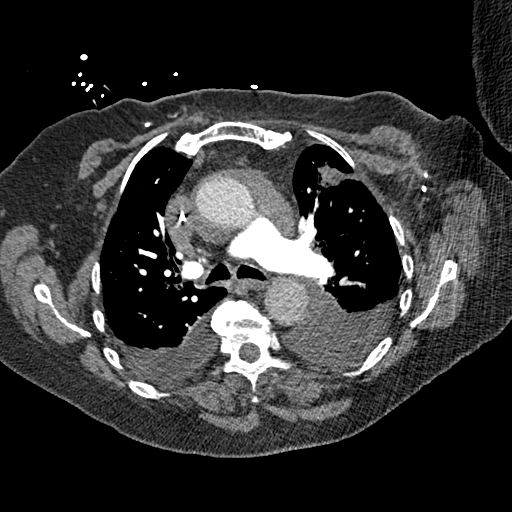
[im 187/259  lung]
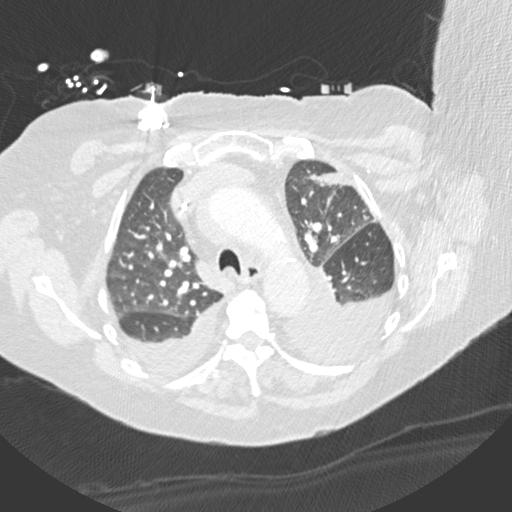
[im 201/259  soft-tissue]
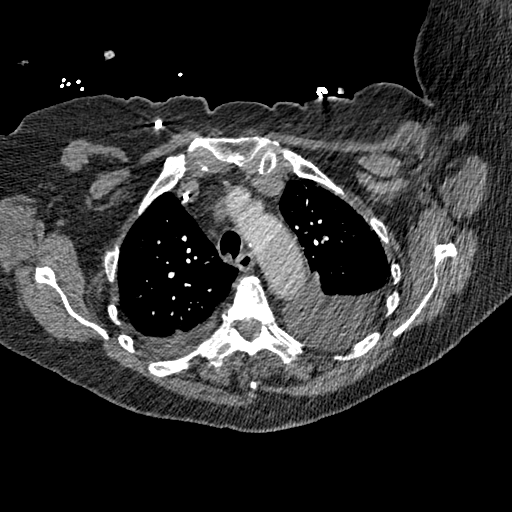
[im 230/259  lung]
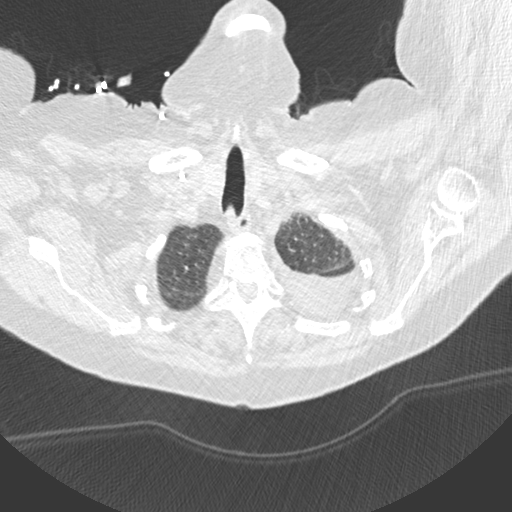
[im 244/259  soft-tissue]
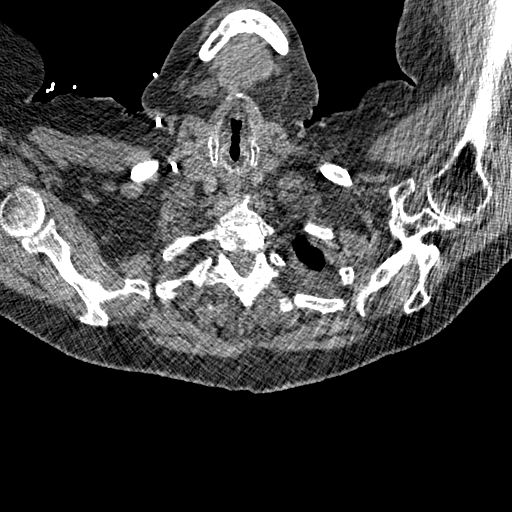

[Series 8: coronal mpr · coronal · 0.53mm/px · 3 of 126 slices shown]
[im 32/126  soft-tissue]
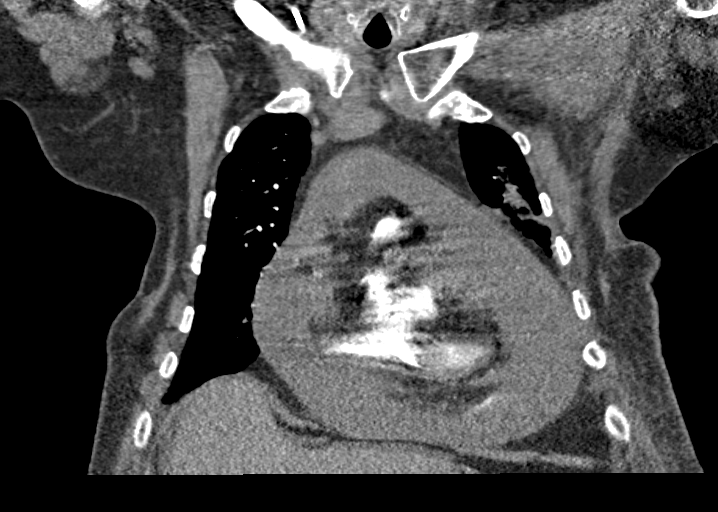
[im 63/126  soft-tissue]
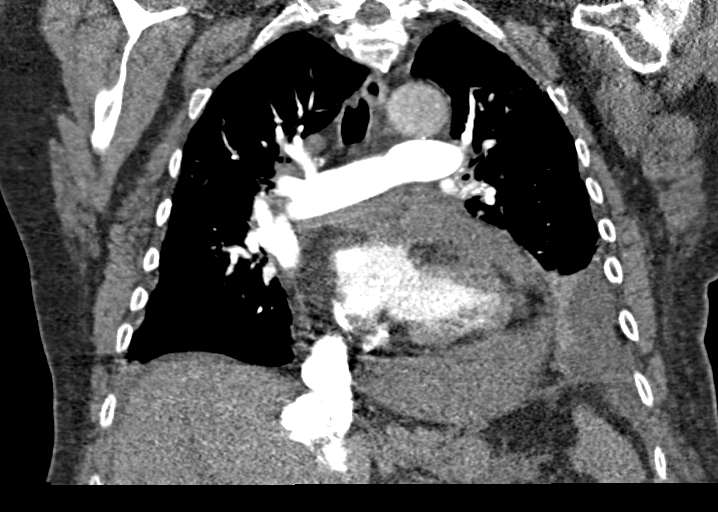
[im 94/126  soft-tissue]
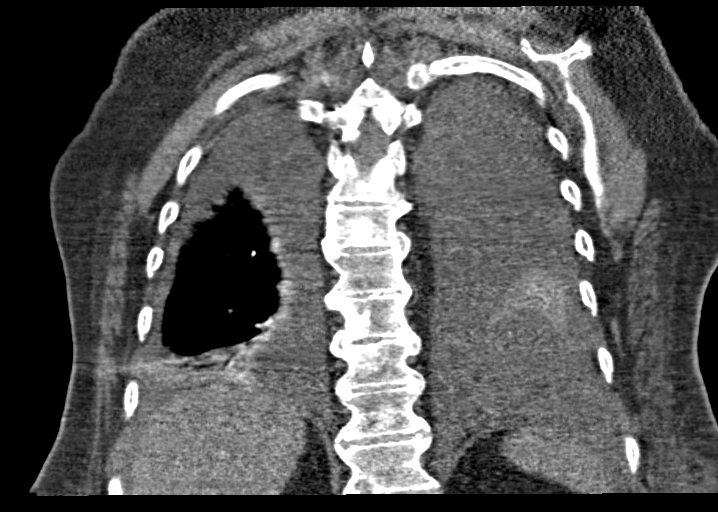

[17 of 46 positions shown; findings below may reference images not displayed]

FINDINGS: Mediastinum/Nodes: The quality of this exam for evaluation of
pulmonary embolism is sufficient. Relatively small volume but
definite pulmonary emboli. Examples within a right upper lobe
segmental branch in image 76 of series 6 and a right lower lobe
subsegmental branch on image 157 of series 6.

No supraclavicular adenopathy. A right Port-A-Cath which terminates
at the mid to high at right atrium. Normal heart size. Moderate to
large pericardial effusion, increased. Multivessel coronary artery
atherosclerosis. Lad coronary artery atherosclerosis on image 44.
Elevated right heart pressures, with reflux of contrast into the IVC
and hepatic veins.

Resolution of previously described mediastinal adenopathy. No hilar
adenopathy. No internal mammary adenopathy. Left axillary node
dissection, without axillary adenopathy. Bilateral mastectomy.

Lungs/Pleura: Moderate left and small right pleural effusions, both
enlarged since the prior CT. Motion and patient body habitus
degradation. Right base subsegmental atelectasis.

Subpleural radiation fibrosis within the left upper lobe.
Collapse/consolidative change in the left lower lobe. Improved since
09/11/2014.

Upper abdomen: Normal imaged portions of the liver, spleen, stomach,
adrenal glands. Trace perihepatic ascites.

Musculoskeletal: New heterogeneous sclerosis, consistent with
osseous metastasis.

Review of the MIP images confirms the above findings.
IMPRESSION: 1. Small volume pulmonary emboli. These results were called by
telephone at the time of interpretation on 12/13/2014 at [DATE] to
covering nurse, Stucha, who verbally acknowledged these results.
2. Enlargement of bilateral pleural effusions and a now large
pericardial effusion. suggestion of elevated right heart pressures.
3.  Atherosclerosis, including within the coronary arteries.
4. Left base aeration which is improved. Residual
collapse/consolidative changes could represent atelectasis and/or
infection.
5. Resolution of mediastinal and hilar adenopathy.
6. Development of osseous metastasis.
7. Trace perihepatic ascites.

## 2016-06-10 IMAGING — CR DG CHEST 1V
1 series · 1 of 1 positions shown · non-contrast
Comparison: Chest radiograph dated 12/09/2014.

CLINICAL DATA: 69-year-old female with history of breast cancer and
shortness of breath.

EXAM:
CHEST  1 VIEW

[x chest ap]
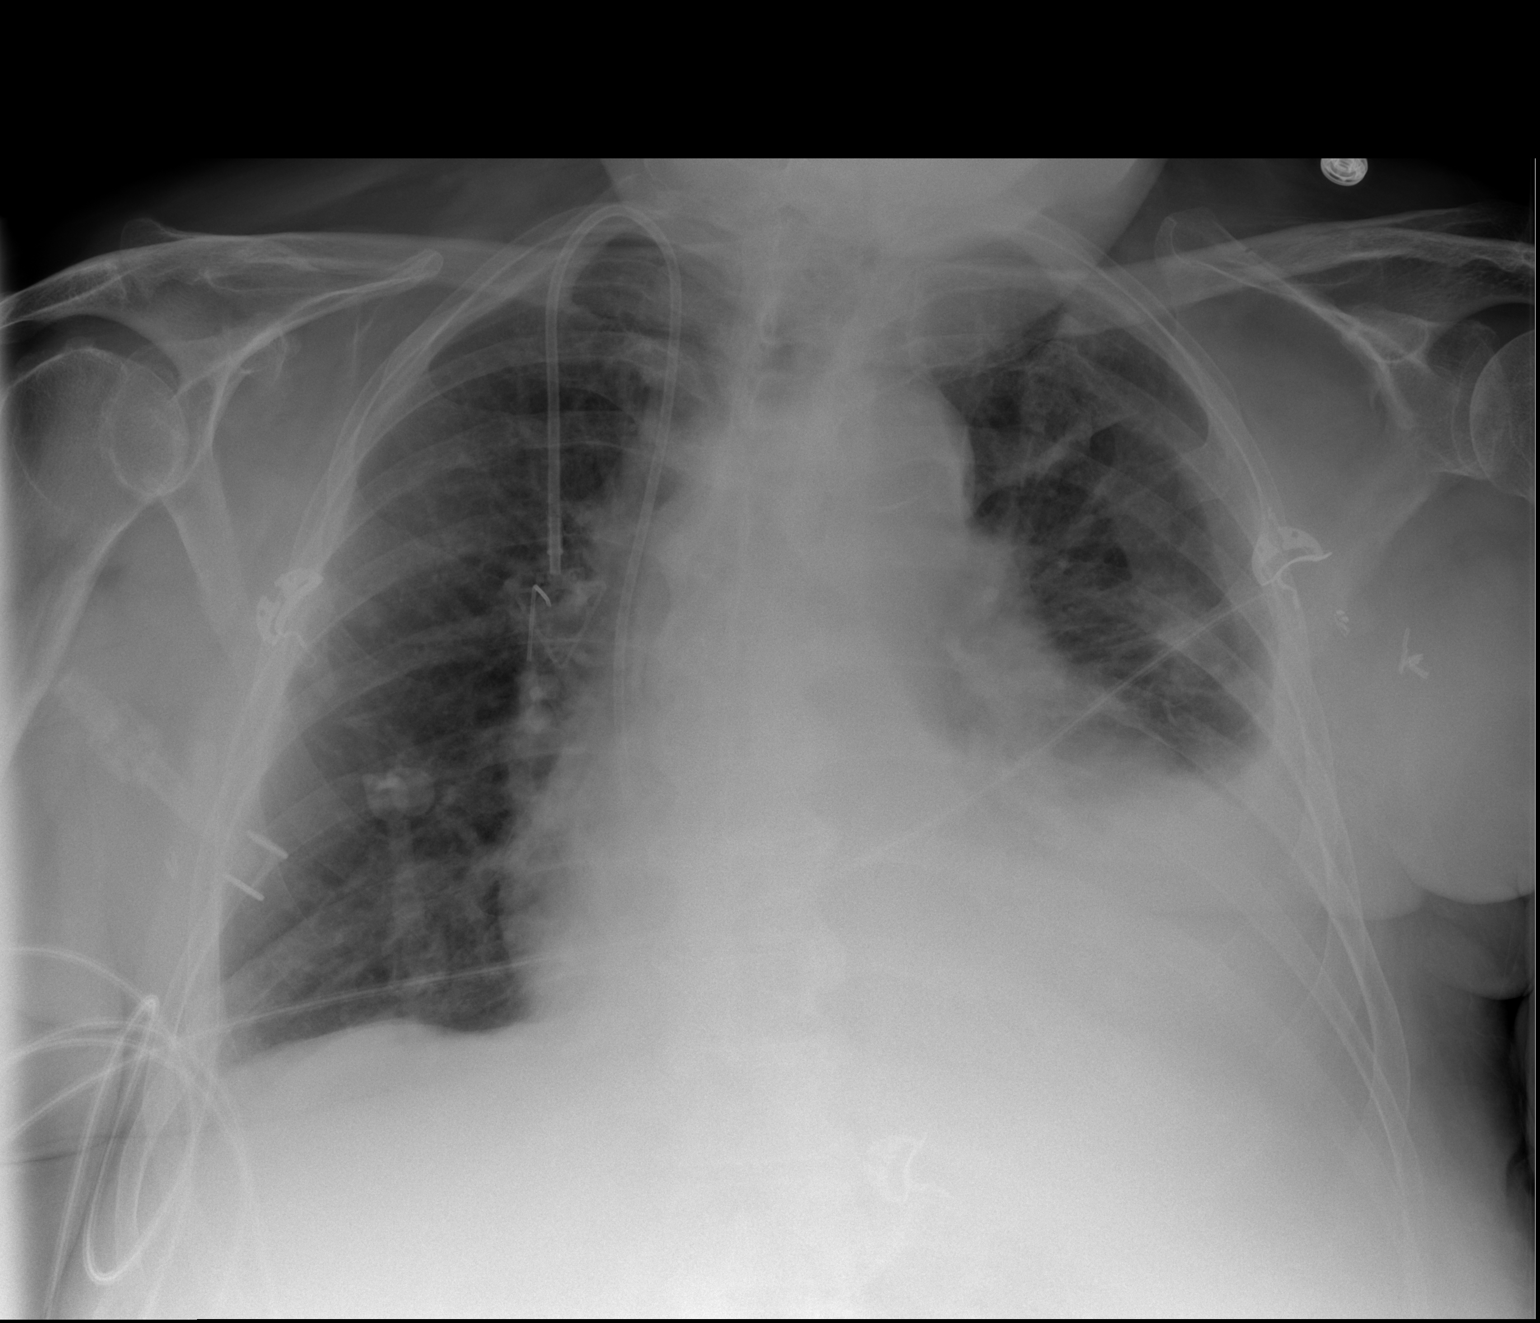

[1 of 1 positions shown; findings below may reference images not displayed]

FINDINGS: Right IJ central Port-A-Cath with tip at atrial caval junction in
stable positioning. There has been interval increase in the left
lower lung field opacity likely combination of pleural effusion with
associated compressive atelectasis of the adjacent lung.
Superimposed pneumonia is not excluded. Small right pleural
effusion. No pneumothorax. Stable cardiac silhouette. Grossly
unremarkable osseous structures. Left axillary surgical clips.
IMPRESSION: Interval increase in the left pleural effusion with consolidative
changes of the adjacent lung likely atelectasis/ pneumonia. Clinical
correlation and follow-up recommended.

## 2016-06-18 ENCOUNTER — Ambulatory Visit (HOSPITAL_BASED_OUTPATIENT_CLINIC_OR_DEPARTMENT_OTHER): Payer: Medicare Other

## 2016-06-18 ENCOUNTER — Ambulatory Visit: Payer: Self-pay | Admitting: Oncology

## 2016-06-18 ENCOUNTER — Other Ambulatory Visit (HOSPITAL_BASED_OUTPATIENT_CLINIC_OR_DEPARTMENT_OTHER): Payer: Medicare Other

## 2016-06-18 VITALS — BP 121/63 | HR 79 | Temp 98.6°F | Resp 16

## 2016-06-18 DIAGNOSIS — C7800 Secondary malignant neoplasm of unspecified lung: Secondary | ICD-10-CM

## 2016-06-18 DIAGNOSIS — C7931 Secondary malignant neoplasm of brain: Secondary | ICD-10-CM

## 2016-06-18 DIAGNOSIS — C50811 Malignant neoplasm of overlapping sites of right female breast: Secondary | ICD-10-CM

## 2016-06-18 DIAGNOSIS — C50911 Malignant neoplasm of unspecified site of right female breast: Secondary | ICD-10-CM | POA: Diagnosis present

## 2016-06-18 DIAGNOSIS — J910 Malignant pleural effusion: Secondary | ICD-10-CM

## 2016-06-18 DIAGNOSIS — C50812 Malignant neoplasm of overlapping sites of left female breast: Secondary | ICD-10-CM

## 2016-06-18 DIAGNOSIS — C50912 Malignant neoplasm of unspecified site of left female breast: Secondary | ICD-10-CM

## 2016-06-18 DIAGNOSIS — Z5112 Encounter for antineoplastic immunotherapy: Secondary | ICD-10-CM

## 2016-06-18 DIAGNOSIS — C50919 Malignant neoplasm of unspecified site of unspecified female breast: Secondary | ICD-10-CM

## 2016-06-18 DIAGNOSIS — C7951 Secondary malignant neoplasm of bone: Secondary | ICD-10-CM

## 2016-06-18 LAB — CBC WITH DIFFERENTIAL/PLATELET
BASO%: 0.3 % (ref 0.0–2.0)
BASOS ABS: 0 10*3/uL (ref 0.0–0.1)
EOS ABS: 0.1 10*3/uL (ref 0.0–0.5)
EOS%: 1.9 % (ref 0.0–7.0)
HEMATOCRIT: 34.3 % — AB (ref 34.8–46.6)
HGB: 11.3 g/dL — ABNORMAL LOW (ref 11.6–15.9)
LYMPH#: 1.8 10*3/uL (ref 0.9–3.3)
LYMPH%: 30.5 % (ref 14.0–49.7)
MCH: 31.7 pg (ref 25.1–34.0)
MCHC: 32.9 g/dL (ref 31.5–36.0)
MCV: 96.1 fL (ref 79.5–101.0)
MONO#: 0.6 10*3/uL (ref 0.1–0.9)
MONO%: 10.6 % (ref 0.0–14.0)
NEUT#: 3.3 10*3/uL (ref 1.5–6.5)
NEUT%: 56.7 % (ref 38.4–76.8)
PLATELETS: 187 10*3/uL (ref 145–400)
RBC: 3.57 10*6/uL — ABNORMAL LOW (ref 3.70–5.45)
RDW: 16.2 % — ABNORMAL HIGH (ref 11.2–14.5)
WBC: 5.7 10*3/uL (ref 3.9–10.3)

## 2016-06-18 LAB — COMPREHENSIVE METABOLIC PANEL
ALK PHOS: 157 U/L — AB (ref 40–150)
ALT: 37 U/L (ref 0–55)
ANION GAP: 9 meq/L (ref 3–11)
AST: 48 U/L — AB (ref 5–34)
Albumin: 3.3 g/dL — ABNORMAL LOW (ref 3.5–5.0)
BUN: 14.3 mg/dL (ref 7.0–26.0)
CALCIUM: 10.2 mg/dL (ref 8.4–10.4)
CHLORIDE: 102 meq/L (ref 98–109)
CO2: 29 mEq/L (ref 22–29)
Creatinine: 0.8 mg/dL (ref 0.6–1.1)
EGFR: 81 mL/min/{1.73_m2} — AB (ref >90)
Glucose: 101 mg/dl (ref 70–140)
Potassium: 4 mEq/L (ref 3.5–5.1)
Sodium: 141 mEq/L (ref 136–145)
Total Bilirubin: 0.79 mg/dL (ref 0.20–1.20)
Total Protein: 8.3 g/dL (ref 6.4–8.3)

## 2016-06-18 MED ORDER — ACETAMINOPHEN 325 MG PO TABS
ORAL_TABLET | ORAL | Status: AC
  Filled 2016-06-18: qty 2

## 2016-06-18 MED ORDER — SODIUM CHLORIDE 0.9 % IJ SOLN
10.0000 mL | INTRAMUSCULAR | Status: DC | PRN
  Administered 2016-06-18: 10 mL
  Filled 2016-06-18: qty 10

## 2016-06-18 MED ORDER — HEPARIN SOD (PORK) LOCK FLUSH 100 UNIT/ML IV SOLN
500.0000 [IU] | Freq: Once | INTRAVENOUS | Status: AC | PRN
  Administered 2016-06-18: 500 [IU]
  Filled 2016-06-18: qty 5

## 2016-06-18 MED ORDER — SODIUM CHLORIDE 0.9 % IV SOLN
Freq: Once | INTRAVENOUS | Status: AC
  Administered 2016-06-18: 15:00:00 via INTRAVENOUS

## 2016-06-18 MED ORDER — SODIUM CHLORIDE 0.9 % IV SOLN
3.5000 mg/kg | Freq: Once | INTRAVENOUS | Status: AC
  Administered 2016-06-18: 360 mg via INTRAVENOUS
  Filled 2016-06-18: qty 8

## 2016-06-18 MED ORDER — DIPHENHYDRAMINE HCL 25 MG PO CAPS
ORAL_CAPSULE | ORAL | Status: AC
  Filled 2016-06-18: qty 1

## 2016-06-18 MED ORDER — DIPHENHYDRAMINE HCL 25 MG PO CAPS
25.0000 mg | ORAL_CAPSULE | Freq: Once | ORAL | Status: AC
  Administered 2016-06-18: 25 mg via ORAL

## 2016-06-18 MED ORDER — ACETAMINOPHEN 325 MG PO TABS
650.0000 mg | ORAL_TABLET | Freq: Once | ORAL | Status: AC
  Administered 2016-06-18: 650 mg via ORAL

## 2016-06-18 NOTE — Progress Notes (Signed)
Patient reported muscle weakness in right foot/leg while applying pressure, while depressing the brake in her car, no weakness while ambulating. Val RN notified and reported to Dr. Jana Hakim. Continue with Kadcyla per Dr. Jana Hakim. Val will set up appointment to see Dr. Jana Hakim with next infusion. Patient is to follow up with PCP and was notified.

## 2016-06-18 NOTE — Patient Instructions (Signed)
[facility] Discharge Instructions for Patients Receiving Chemotherapy  Today you received the following chemotherapy agents: Kadcyla  To help prevent nausea and vomiting after your treatment, we encourage you to take your nausea medication as directed.    If you develop nausea and vomiting that is not controlled by your nausea medication, call the clinic.   BELOW ARE SYMPTOMS THAT SHOULD BE REPORTED IMMEDIATELY:  *FEVER GREATER THAN 100.5 F  *CHILLS WITH OR WITHOUT FEVER  NAUSEA AND VOMITING THAT IS NOT CONTROLLED WITH YOUR NAUSEA MEDICATION  *UNUSUAL SHORTNESS OF BREATH  *UNUSUAL BRUISING OR BLEEDING  TENDERNESS IN MOUTH AND THROAT WITH OR WITHOUT PRESENCE OF ULCERS  *URINARY PROBLEMS  *BOWEL PROBLEMS  UNUSUAL RASH Items with * indicate a potential emergency and should be followed up as soon as possible.  Feel free to call the clinic you have any questions or concerns. The clinic phone number is [phone].  Please show the CHEMO ALERT CARD at check-in to the Emergency Department and triage nurse.   

## 2016-06-23 MED FILL — ANASTROZOLE 1 MG TABLET: 1 | 30 days supply | Qty: 30 | Fill #2

## 2016-07-02 ENCOUNTER — Other Ambulatory Visit: Payer: Self-pay

## 2016-07-02 ENCOUNTER — Ambulatory Visit: Payer: Self-pay

## 2016-07-03 ENCOUNTER — Telehealth: Payer: Self-pay | Admitting: Oncology

## 2016-07-03 NOTE — Telephone Encounter (Signed)
SPOKE WITH PATIENT RE 1/24 APPOINTMENTS. RESCHEDULED DUE TO WEATHER PER 1/18 Venus MSG.

## 2016-07-09 ENCOUNTER — Encounter: Payer: Self-pay | Admitting: Emergency Medicine

## 2016-07-09 ENCOUNTER — Other Ambulatory Visit (HOSPITAL_BASED_OUTPATIENT_CLINIC_OR_DEPARTMENT_OTHER): Payer: Medicare Other

## 2016-07-09 ENCOUNTER — Ambulatory Visit (HOSPITAL_BASED_OUTPATIENT_CLINIC_OR_DEPARTMENT_OTHER): Payer: Medicare Other

## 2016-07-09 VITALS — BP 142/57 | HR 78 | Temp 97.8°F | Resp 18

## 2016-07-09 DIAGNOSIS — Z5112 Encounter for antineoplastic immunotherapy: Secondary | ICD-10-CM

## 2016-07-09 DIAGNOSIS — C7951 Secondary malignant neoplasm of bone: Secondary | ICD-10-CM | POA: Diagnosis not present

## 2016-07-09 DIAGNOSIS — C50911 Malignant neoplasm of unspecified site of right female breast: Secondary | ICD-10-CM | POA: Diagnosis present

## 2016-07-09 DIAGNOSIS — C50912 Malignant neoplasm of unspecified site of left female breast: Principal | ICD-10-CM

## 2016-07-09 DIAGNOSIS — J910 Malignant pleural effusion: Secondary | ICD-10-CM

## 2016-07-09 DIAGNOSIS — C7800 Secondary malignant neoplasm of unspecified lung: Secondary | ICD-10-CM

## 2016-07-09 DIAGNOSIS — C50812 Malignant neoplasm of overlapping sites of left female breast: Secondary | ICD-10-CM

## 2016-07-09 DIAGNOSIS — C7931 Secondary malignant neoplasm of brain: Secondary | ICD-10-CM

## 2016-07-09 DIAGNOSIS — C50919 Malignant neoplasm of unspecified site of unspecified female breast: Secondary | ICD-10-CM

## 2016-07-09 DIAGNOSIS — C50811 Malignant neoplasm of overlapping sites of right female breast: Secondary | ICD-10-CM

## 2016-07-09 LAB — COMPREHENSIVE METABOLIC PANEL
ALT: 35 U/L (ref 0–55)
ANION GAP: 7 meq/L (ref 3–11)
AST: 50 U/L — AB (ref 5–34)
Albumin: 3 g/dL — ABNORMAL LOW (ref 3.5–5.0)
Alkaline Phosphatase: 132 U/L (ref 40–150)
BILIRUBIN TOTAL: 0.89 mg/dL (ref 0.20–1.20)
BUN: 13.8 mg/dL (ref 7.0–26.0)
CHLORIDE: 105 meq/L (ref 98–109)
CO2: 30 meq/L — AB (ref 22–29)
CREATININE: 0.7 mg/dL (ref 0.6–1.1)
Calcium: 9.6 mg/dL (ref 8.4–10.4)
EGFR: 90 mL/min/{1.73_m2} — ABNORMAL LOW (ref >90)
Glucose: 84 mg/dl (ref 70–140)
Potassium: 3.8 mEq/L (ref 3.5–5.1)
Sodium: 142 mEq/L (ref 136–145)
TOTAL PROTEIN: 7.4 g/dL (ref 6.4–8.3)

## 2016-07-09 LAB — CBC WITH DIFFERENTIAL/PLATELET
BASO%: 0.3 % (ref 0.0–2.0)
Basophils Absolute: 0 10*3/uL (ref 0.0–0.1)
EOS%: 1.4 % (ref 0.0–7.0)
Eosinophils Absolute: 0.1 10*3/uL (ref 0.0–0.5)
HEMATOCRIT: 32.8 % — AB (ref 34.8–46.6)
HGB: 10.5 g/dL — ABNORMAL LOW (ref 11.6–15.9)
LYMPH#: 1.9 10*3/uL (ref 0.9–3.3)
LYMPH%: 32.2 % (ref 14.0–49.7)
MCH: 30.6 pg (ref 25.1–34.0)
MCHC: 32 g/dL (ref 31.5–36.0)
MCV: 95.6 fL (ref 79.5–101.0)
MONO#: 0.7 10*3/uL (ref 0.1–0.9)
MONO%: 11.4 % (ref 0.0–14.0)
NEUT%: 54.7 % (ref 38.4–76.8)
NEUTROS ABS: 3.2 10*3/uL (ref 1.5–6.5)
PLATELETS: 129 10*3/uL — AB (ref 145–400)
RBC: 3.43 10*6/uL — AB (ref 3.70–5.45)
RDW: 16.4 % — ABNORMAL HIGH (ref 11.2–14.5)
WBC: 5.8 10*3/uL (ref 3.9–10.3)

## 2016-07-09 MED ORDER — SODIUM CHLORIDE 0.9 % IV SOLN
Freq: Once | INTRAVENOUS | Status: AC
  Administered 2016-07-09: 14:00:00 via INTRAVENOUS

## 2016-07-09 MED ORDER — ACETAMINOPHEN 325 MG PO TABS
650.0000 mg | ORAL_TABLET | Freq: Once | ORAL | Status: AC
  Administered 2016-07-09: 650 mg via ORAL

## 2016-07-09 MED ORDER — HEPARIN SOD (PORK) LOCK FLUSH 100 UNIT/ML IV SOLN
500.0000 [IU] | Freq: Once | INTRAVENOUS | Status: AC | PRN
  Administered 2016-07-09: 500 [IU]
  Filled 2016-07-09: qty 5

## 2016-07-09 MED ORDER — ACETAMINOPHEN 325 MG PO TABS
ORAL_TABLET | ORAL | Status: AC
  Filled 2016-07-09: qty 2

## 2016-07-09 MED ORDER — DIPHENHYDRAMINE HCL 25 MG PO CAPS
25.0000 mg | ORAL_CAPSULE | Freq: Once | ORAL | Status: AC
  Administered 2016-07-09: 25 mg via ORAL

## 2016-07-09 MED ORDER — SODIUM CHLORIDE 0.9 % IJ SOLN
10.0000 mL | INTRAMUSCULAR | Status: DC | PRN
  Administered 2016-07-09: 10 mL
  Filled 2016-07-09: qty 10

## 2016-07-09 MED ORDER — DIPHENHYDRAMINE HCL 25 MG PO CAPS
ORAL_CAPSULE | ORAL | Status: AC
  Filled 2016-07-09: qty 1

## 2016-07-09 MED ORDER — SODIUM CHLORIDE 0.9 % IV SOLN
3.5000 mg/kg | Freq: Once | INTRAVENOUS | Status: AC
  Administered 2016-07-09: 360 mg via INTRAVENOUS
  Filled 2016-07-09: qty 8

## 2016-07-09 NOTE — Progress Notes (Signed)
Per Dr Jana Hakim; ok to use portacath.

## 2016-07-09 NOTE — Patient Instructions (Signed)
[facility] Discharge Instructions for Patients Receiving Chemotherapy  Today you received the following chemotherapy agents: Kadcyla  To help prevent nausea and vomiting after your treatment, we encourage you to take your nausea medication as directed.    If you develop nausea and vomiting that is not controlled by your nausea medication, call the clinic.   BELOW ARE SYMPTOMS THAT SHOULD BE REPORTED IMMEDIATELY:  *FEVER GREATER THAN 100.5 F  *CHILLS WITH OR WITHOUT FEVER  NAUSEA AND VOMITING THAT IS NOT CONTROLLED WITH YOUR NAUSEA MEDICATION  *UNUSUAL SHORTNESS OF BREATH  *UNUSUAL BRUISING OR BLEEDING  TENDERNESS IN MOUTH AND THROAT WITH OR WITHOUT PRESENCE OF ULCERS  *URINARY PROBLEMS  *BOWEL PROBLEMS  UNUSUAL RASH Items with * indicate a potential emergency and should be followed up as soon as possible.  Feel free to call the clinic you have any questions or concerns. The clinic phone number is [phone].  Please show the CHEMO ALERT CARD at check-in to the Emergency Department and triage nurse.   

## 2016-07-09 NOTE — Progress Notes (Signed)
Patient refuses to stay for the 30 minutes post infusion observation.

## 2016-07-23 ENCOUNTER — Other Ambulatory Visit: Payer: Self-pay

## 2016-07-23 ENCOUNTER — Ambulatory Visit: Payer: Self-pay

## 2016-07-23 MED FILL — ANASTROZOLE 1 MG TABLET: 1 | 30 days supply | Qty: 30 | Fill #3

## 2016-07-30 ENCOUNTER — Ambulatory Visit (HOSPITAL_BASED_OUTPATIENT_CLINIC_OR_DEPARTMENT_OTHER): Payer: Medicare Other

## 2016-07-30 ENCOUNTER — Other Ambulatory Visit (HOSPITAL_BASED_OUTPATIENT_CLINIC_OR_DEPARTMENT_OTHER): Payer: Medicare Other

## 2016-07-30 VITALS — BP 138/71 | HR 68 | Temp 98.8°F | Resp 18

## 2016-07-30 DIAGNOSIS — C7931 Secondary malignant neoplasm of brain: Secondary | ICD-10-CM

## 2016-07-30 DIAGNOSIS — Z5112 Encounter for antineoplastic immunotherapy: Secondary | ICD-10-CM | POA: Diagnosis present

## 2016-07-30 DIAGNOSIS — C50911 Malignant neoplasm of unspecified site of right female breast: Secondary | ICD-10-CM

## 2016-07-30 DIAGNOSIS — C50912 Malignant neoplasm of unspecified site of left female breast: Secondary | ICD-10-CM

## 2016-07-30 DIAGNOSIS — C50811 Malignant neoplasm of overlapping sites of right female breast: Secondary | ICD-10-CM

## 2016-07-30 DIAGNOSIS — C50012 Malignant neoplasm of nipple and areola, left female breast: Principal | ICD-10-CM

## 2016-07-30 DIAGNOSIS — J910 Malignant pleural effusion: Secondary | ICD-10-CM

## 2016-07-30 DIAGNOSIS — C50011 Malignant neoplasm of nipple and areola, right female breast: Secondary | ICD-10-CM

## 2016-07-30 DIAGNOSIS — C7800 Secondary malignant neoplasm of unspecified lung: Secondary | ICD-10-CM

## 2016-07-30 DIAGNOSIS — C50919 Malignant neoplasm of unspecified site of unspecified female breast: Secondary | ICD-10-CM

## 2016-07-30 DIAGNOSIS — Z170 Estrogen receptor positive status [ER+]: Secondary | ICD-10-CM

## 2016-07-30 DIAGNOSIS — C7951 Secondary malignant neoplasm of bone: Secondary | ICD-10-CM

## 2016-07-30 DIAGNOSIS — C50812 Malignant neoplasm of overlapping sites of left female breast: Secondary | ICD-10-CM

## 2016-07-30 LAB — CBC WITH DIFFERENTIAL/PLATELET
BASO%: 0.5 % (ref 0.0–2.0)
BASOS ABS: 0 10*3/uL (ref 0.0–0.1)
EOS%: 1.4 % (ref 0.0–7.0)
Eosinophils Absolute: 0.1 10*3/uL (ref 0.0–0.5)
HCT: 33.6 % — ABNORMAL LOW (ref 34.8–46.6)
HGB: 11.2 g/dL — ABNORMAL LOW (ref 11.6–15.9)
LYMPH%: 33.8 % (ref 14.0–49.7)
MCH: 31.5 pg (ref 25.1–34.0)
MCHC: 33.2 g/dL (ref 31.5–36.0)
MCV: 94.8 fL (ref 79.5–101.0)
MONO#: 0.5 10*3/uL (ref 0.1–0.9)
MONO%: 11.9 % (ref 0.0–14.0)
NEUT#: 2.3 10*3/uL (ref 1.5–6.5)
NEUT%: 52.4 % (ref 38.4–76.8)
Platelets: 143 10*3/uL — ABNORMAL LOW (ref 145–400)
RBC: 3.55 10*6/uL — ABNORMAL LOW (ref 3.70–5.45)
RDW: 16.9 % — ABNORMAL HIGH (ref 11.2–14.5)
WBC: 4.4 10*3/uL (ref 3.9–10.3)
lymph#: 1.5 10*3/uL (ref 0.9–3.3)

## 2016-07-30 LAB — COMPREHENSIVE METABOLIC PANEL
ALT: 24 U/L (ref 0–55)
AST: 42 U/L — AB (ref 5–34)
Albumin: 3 g/dL — ABNORMAL LOW (ref 3.5–5.0)
Alkaline Phosphatase: 120 U/L (ref 40–150)
Anion Gap: 7 mEq/L (ref 3–11)
BUN: 10.4 mg/dL (ref 7.0–26.0)
CALCIUM: 9.6 mg/dL (ref 8.4–10.4)
CHLORIDE: 105 meq/L (ref 98–109)
CO2: 30 mEq/L — ABNORMAL HIGH (ref 22–29)
Creatinine: 0.7 mg/dL (ref 0.6–1.1)
EGFR: 88 mL/min/{1.73_m2} — ABNORMAL LOW (ref >90)
Glucose: 134 mg/dl (ref 70–140)
POTASSIUM: 3.7 meq/L (ref 3.5–5.1)
SODIUM: 142 meq/L (ref 136–145)
Total Bilirubin: 0.94 mg/dL (ref 0.20–1.20)
Total Protein: 7.4 g/dL (ref 6.4–8.3)

## 2016-07-30 MED ORDER — DIPHENHYDRAMINE HCL 25 MG PO CAPS
25.0000 mg | ORAL_CAPSULE | Freq: Once | ORAL | Status: AC
  Administered 2016-07-30: 25 mg via ORAL

## 2016-07-30 MED ORDER — ACETAMINOPHEN 325 MG PO TABS
650.0000 mg | ORAL_TABLET | Freq: Once | ORAL | Status: AC
  Administered 2016-07-30: 650 mg via ORAL

## 2016-07-30 MED ORDER — DIPHENHYDRAMINE HCL 25 MG PO CAPS
ORAL_CAPSULE | ORAL | Status: AC
  Filled 2016-07-30: qty 1

## 2016-07-30 MED ORDER — ZOLEDRONIC ACID 4 MG/100ML IV SOLN
4.0000 mg | Freq: Once | INTRAVENOUS | Status: AC
  Administered 2016-07-30: 4 mg via INTRAVENOUS
  Filled 2016-07-30: qty 100

## 2016-07-30 MED ORDER — SODIUM CHLORIDE 0.9 % IJ SOLN
10.0000 mL | INTRAMUSCULAR | Status: DC | PRN
  Administered 2016-07-30: 10 mL
  Filled 2016-07-30: qty 10

## 2016-07-30 MED ORDER — SODIUM CHLORIDE 0.9 % IV SOLN
Freq: Once | INTRAVENOUS | Status: AC
  Administered 2016-07-30: 14:00:00 via INTRAVENOUS

## 2016-07-30 MED ORDER — SODIUM CHLORIDE 0.9 % IV SOLN
3.5000 mg/kg | Freq: Once | INTRAVENOUS | Status: AC
  Administered 2016-07-30: 360 mg via INTRAVENOUS
  Filled 2016-07-30: qty 10

## 2016-07-30 MED ORDER — ACETAMINOPHEN 325 MG PO TABS
ORAL_TABLET | ORAL | Status: AC
  Filled 2016-07-30: qty 2

## 2016-07-30 MED ORDER — HEPARIN SOD (PORK) LOCK FLUSH 100 UNIT/ML IV SOLN
500.0000 [IU] | Freq: Once | INTRAVENOUS | Status: AC | PRN
  Administered 2016-07-30: 500 [IU]
  Filled 2016-07-30: qty 5

## 2016-07-30 NOTE — Progress Notes (Signed)
Pt refused to stay 30 minutes post observation. Pt tolerated infusion well. Pt and VS stable at discharge.

## 2016-07-30 NOTE — Patient Instructions (Signed)
[facility] Discharge Instructions for Patients Receiving Chemotherapy  Today you received the following chemotherapy agents: Kadcyla  To help prevent nausea and vomiting after your treatment, we encourage you to take your nausea medication as directed.    If you develop nausea and vomiting that is not controlled by your nausea medication, call the clinic.   BELOW ARE SYMPTOMS THAT SHOULD BE REPORTED IMMEDIATELY:  *FEVER GREATER THAN 100.5 F  *CHILLS WITH OR WITHOUT FEVER  NAUSEA AND VOMITING THAT IS NOT CONTROLLED WITH YOUR NAUSEA MEDICATION  *UNUSUAL SHORTNESS OF BREATH  *UNUSUAL BRUISING OR BLEEDING  TENDERNESS IN MOUTH AND THROAT WITH OR WITHOUT PRESENCE OF ULCERS  *URINARY PROBLEMS  *BOWEL PROBLEMS  UNUSUAL RASH Items with * indicate a potential emergency and should be followed up as soon as possible.  Feel free to call the clinic you have any questions or concerns. The clinic phone number is [phone].  Please show the Echo at check-in to the Emergency Department and triage nurse.    Zoledronic Acid injection (Hypercalcemia, Oncology) What is this medicine? ZOLEDRONIC ACID (ZOE le dron ik AS id) lowers the amount of calcium loss from bone. It is used to treat too much calcium in your blood from cancer. It is also used to prevent complications of cancer that has spread to the bone. This medicine may be used for other purposes; ask your health care provider or pharmacist if you have questions. COMMON BRAND NAME(S): Zometa What should I tell my health care provider before I take this medicine? They need to know if you have any of these conditions: -aspirin-sensitive asthma -cancer, especially if you are receiving medicines used to treat cancer -dental disease or wear dentures -infection -kidney disease -receiving corticosteroids like dexamethasone or prednisone -an unusual or allergic reaction to zoledronic acid, other medicines,  foods, dyes, or preservatives -pregnant or trying to get pregnant -breast-feeding How should I use this medicine? This medicine is for infusion into a vein. It is given by a health care professional in a hospital or clinic setting. Talk to your pediatrician regarding the use of this medicine in children. Special care may be needed. Overdosage: If you think you have taken too much of this medicine contact a poison control center or emergency room at once. NOTE: This medicine is only for you. Do not share this medicine with others. What if I miss a dose? It is important not to miss your dose. Call your doctor or health care professional if you are unable to keep an appointment. What may interact with this medicine? -certain antibiotics given by injection -NSAIDs, medicines for pain and inflammation, like ibuprofen or naproxen -some diuretics like bumetanide, furosemide -teriparatide -thalidomide This list may not describe all possible interactions. Give your health care provider a list of all the medicines, herbs, non-prescription drugs, or dietary supplements you use. Also tell them if you smoke, drink alcohol, or use illegal drugs. Some items may interact with your medicine. What should I watch for while using this medicine? Visit your doctor or health care professional for regular checkups. It may be some time before you see the benefit from this medicine. Do not stop taking your medicine unless your doctor tells you to. Your doctor may order blood tests or other tests to see how you are doing. Women should inform their doctor if they wish to become pregnant or think they might be pregnant. There is a potential for serious side effects to an unborn child.  Talk to your health care professional or pharmacist for more information. You should make sure that you get enough calcium and vitamin D while you are taking this medicine. Discuss the foods you eat and the vitamins you take with your health  care professional. Some people who take this medicine have severe bone, joint, and/or muscle pain. This medicine may also increase your risk for jaw problems or a broken thigh bone. Tell your doctor right away if you have severe pain in your jaw, bones, joints, or muscles. Tell your doctor if you have any pain that does not go away or that gets worse. Tell your dentist and dental surgeon that you are taking this medicine. You should not have major dental surgery while on this medicine. See your dentist to have a dental exam and fix any dental problems before starting this medicine. Take good care of your teeth while on this medicine. Make sure you see your dentist for regular follow-up appointments. What side effects may I notice from receiving this medicine? Side effects that you should report to your doctor or health care professional as soon as possible: -allergic reactions like skin rash, itching or hives, swelling of the face, lips, or tongue -anxiety, confusion, or depression -breathing problems -changes in vision -eye pain -feeling faint or lightheaded, falls -jaw pain, especially after dental work -mouth sores -muscle cramps, stiffness, or weakness -redness, blistering, peeling or loosening of the skin, including inside the mouth -trouble passing urine or change in the amount of urine Side effects that usually do not require medical attention (report to your doctor or health care professional if they continue or are bothersome): -bone, joint, or muscle pain -constipation -diarrhea -fever -hair loss -irritation at site where injected -loss of appetite -nausea, vomiting -stomach upset -trouble sleeping -trouble swallowing -weak or tired This list may not describe all possible side effects. Call your doctor for medical advice about side effects. You may report side effects to FDA at 1-800-FDA-1088. Where should I keep my medicine? This drug is given in a hospital or clinic and  will not be stored at home. NOTE: This sheet is a summary. It may not cover all possible information. If you have questions about this medicine, talk to your doctor, pharmacist, or health care provider.  2017 Elsevier/Gold Standard (2013-10-29 14:19:39)

## 2016-08-01 ENCOUNTER — Telehealth: Payer: Self-pay | Admitting: Oncology

## 2016-08-01 NOTE — Telephone Encounter (Signed)
Called vasular dept to get pt sch for echo in March per LOS. Left contact information to return call to pt to schedule

## 2016-08-07 ENCOUNTER — Ambulatory Visit (HOSPITAL_COMMUNITY): Admission: RE | Admit: 2016-08-07 | Payer: Medicare Other | Source: Ambulatory Visit

## 2016-08-13 ENCOUNTER — Other Ambulatory Visit: Payer: Self-pay

## 2016-08-13 ENCOUNTER — Ambulatory Visit: Payer: Self-pay

## 2016-08-20 ENCOUNTER — Other Ambulatory Visit (HOSPITAL_BASED_OUTPATIENT_CLINIC_OR_DEPARTMENT_OTHER): Payer: Medicare Other

## 2016-08-20 ENCOUNTER — Ambulatory Visit (HOSPITAL_BASED_OUTPATIENT_CLINIC_OR_DEPARTMENT_OTHER): Payer: Medicare Other

## 2016-08-20 VITALS — BP 142/66 | HR 68 | Temp 97.8°F | Resp 17 | Wt 215.4 lb

## 2016-08-20 DIAGNOSIS — C50912 Malignant neoplasm of unspecified site of left female breast: Secondary | ICD-10-CM | POA: Diagnosis not present

## 2016-08-20 DIAGNOSIS — Z5112 Encounter for antineoplastic immunotherapy: Secondary | ICD-10-CM | POA: Diagnosis present

## 2016-08-20 DIAGNOSIS — C7931 Secondary malignant neoplasm of brain: Secondary | ICD-10-CM

## 2016-08-20 DIAGNOSIS — J910 Malignant pleural effusion: Secondary | ICD-10-CM | POA: Diagnosis not present

## 2016-08-20 DIAGNOSIS — C50812 Malignant neoplasm of overlapping sites of left female breast: Secondary | ICD-10-CM

## 2016-08-20 DIAGNOSIS — Z170 Estrogen receptor positive status [ER+]: Secondary | ICD-10-CM

## 2016-08-20 DIAGNOSIS — C7951 Secondary malignant neoplasm of bone: Secondary | ICD-10-CM | POA: Diagnosis not present

## 2016-08-20 DIAGNOSIS — C50911 Malignant neoplasm of unspecified site of right female breast: Secondary | ICD-10-CM | POA: Diagnosis present

## 2016-08-20 DIAGNOSIS — C7800 Secondary malignant neoplasm of unspecified lung: Secondary | ICD-10-CM

## 2016-08-20 DIAGNOSIS — C50811 Malignant neoplasm of overlapping sites of right female breast: Secondary | ICD-10-CM

## 2016-08-20 DIAGNOSIS — C50919 Malignant neoplasm of unspecified site of unspecified female breast: Secondary | ICD-10-CM

## 2016-08-20 LAB — CBC WITH DIFFERENTIAL/PLATELET
BASO%: 0.7 % (ref 0.0–2.0)
BASOS ABS: 0 10*3/uL (ref 0.0–0.1)
EOS ABS: 0.1 10*3/uL (ref 0.0–0.5)
EOS%: 1.7 % (ref 0.0–7.0)
HCT: 32.6 % — ABNORMAL LOW (ref 34.8–46.6)
HGB: 10.9 g/dL — ABNORMAL LOW (ref 11.6–15.9)
LYMPH%: 34.6 % (ref 14.0–49.7)
MCH: 31.4 pg (ref 25.1–34.0)
MCHC: 33.4 g/dL (ref 31.5–36.0)
MCV: 94.1 fL (ref 79.5–101.0)
MONO#: 0.7 10*3/uL (ref 0.1–0.9)
MONO%: 14 % (ref 0.0–14.0)
NEUT#: 2.4 10*3/uL (ref 1.5–6.5)
NEUT%: 49 % (ref 38.4–76.8)
PLATELETS: 159 10*3/uL (ref 145–400)
RBC: 3.46 10*6/uL — AB (ref 3.70–5.45)
RDW: 17 % — AB (ref 11.2–14.5)
WBC: 5 10*3/uL (ref 3.9–10.3)
lymph#: 1.7 10*3/uL (ref 0.9–3.3)

## 2016-08-20 LAB — COMPREHENSIVE METABOLIC PANEL
ALT: 40 U/L (ref 0–55)
AST: 59 U/L — AB (ref 5–34)
Albumin: 3.2 g/dL — ABNORMAL LOW (ref 3.5–5.0)
Alkaline Phosphatase: 153 U/L — ABNORMAL HIGH (ref 40–150)
Anion Gap: 5 mEq/L (ref 3–11)
BUN: 13.6 mg/dL (ref 7.0–26.0)
CHLORIDE: 107 meq/L (ref 98–109)
CO2: 31 meq/L — AB (ref 22–29)
Calcium: 9.8 mg/dL (ref 8.4–10.4)
Creatinine: 0.6 mg/dL (ref 0.6–1.1)
GLUCOSE: 98 mg/dL (ref 70–140)
POTASSIUM: 3.9 meq/L (ref 3.5–5.1)
SODIUM: 142 meq/L (ref 136–145)
Total Bilirubin: 1.02 mg/dL (ref 0.20–1.20)
Total Protein: 7.7 g/dL (ref 6.4–8.3)

## 2016-08-20 MED ORDER — ACETAMINOPHEN 325 MG PO TABS
ORAL_TABLET | ORAL | Status: AC
  Filled 2016-08-20: qty 2

## 2016-08-20 MED ORDER — DIPHENHYDRAMINE HCL 25 MG PO CAPS
25.0000 mg | ORAL_CAPSULE | Freq: Once | ORAL | Status: AC
  Administered 2016-08-20: 25 mg via ORAL

## 2016-08-20 MED ORDER — DIPHENHYDRAMINE HCL 25 MG PO CAPS
ORAL_CAPSULE | ORAL | Status: AC
  Filled 2016-08-20: qty 1

## 2016-08-20 MED ORDER — SODIUM CHLORIDE 0.9 % IJ SOLN
10.0000 mL | INTRAMUSCULAR | Status: DC | PRN
  Administered 2016-08-20: 10 mL
  Filled 2016-08-20: qty 10

## 2016-08-20 MED ORDER — SODIUM CHLORIDE 0.9 % IV SOLN
3.5000 mg/kg | Freq: Once | INTRAVENOUS | Status: AC
  Administered 2016-08-20: 360 mg via INTRAVENOUS
  Filled 2016-08-20: qty 10

## 2016-08-20 MED ORDER — HEPARIN SOD (PORK) LOCK FLUSH 100 UNIT/ML IV SOLN
500.0000 [IU] | Freq: Once | INTRAVENOUS | Status: AC | PRN
  Administered 2016-08-20: 500 [IU]
  Filled 2016-08-20: qty 5

## 2016-08-20 MED ORDER — SODIUM CHLORIDE 0.9 % IV SOLN
Freq: Once | INTRAVENOUS | Status: AC
  Administered 2016-08-20: 13:00:00 via INTRAVENOUS

## 2016-08-20 MED ORDER — ACETAMINOPHEN 325 MG PO TABS
650.0000 mg | ORAL_TABLET | Freq: Once | ORAL | Status: AC
  Administered 2016-08-20: 650 mg via ORAL

## 2016-08-20 NOTE — Patient Instructions (Signed)
[facility] Discharge Instructions for Patients Receiving Chemotherapy  Today you received the following chemotherapy agents: Kadcyla  To help prevent nausea and vomiting after your treatment, we encourage you to take your nausea medication as directed.    If you develop nausea and vomiting that is not controlled by your nausea medication, call the clinic.   BELOW ARE SYMPTOMS THAT SHOULD BE REPORTED IMMEDIATELY:  *FEVER GREATER THAN 100.5 F  *CHILLS WITH OR WITHOUT FEVER  NAUSEA AND VOMITING THAT IS NOT CONTROLLED WITH YOUR NAUSEA MEDICATION  *UNUSUAL SHORTNESS OF BREATH  *UNUSUAL BRUISING OR BLEEDING  TENDERNESS IN MOUTH AND THROAT WITH OR WITHOUT PRESENCE OF ULCERS  *URINARY PROBLEMS  *BOWEL PROBLEMS  UNUSUAL RASH Items with * indicate a potential emergency and should be followed up as soon as possible.  Feel free to call the clinic you have any questions or concerns. The clinic phone number is [phone].  Please show the CHEMO ALERT CARD at check-in to the Emergency Department and triage nurse.   

## 2016-08-26 ENCOUNTER — Other Ambulatory Visit: Payer: Self-pay | Admitting: *Deleted

## 2016-08-26 ENCOUNTER — Telehealth: Payer: Self-pay | Admitting: *Deleted

## 2016-08-26 DIAGNOSIS — C50911 Malignant neoplasm of unspecified site of right female breast: Secondary | ICD-10-CM

## 2016-08-26 DIAGNOSIS — Z170 Estrogen receptor positive status [ER+]: Secondary | ICD-10-CM

## 2016-08-26 DIAGNOSIS — Z5181 Encounter for therapeutic drug level monitoring: Secondary | ICD-10-CM

## 2016-08-26 DIAGNOSIS — C50912 Malignant neoplasm of unspecified site of left female breast: Secondary | ICD-10-CM

## 2016-08-26 NOTE — Telephone Encounter (Signed)
This RN sent an inbasket message to the Cardiac Failure Clinic per need for follow up for ECHO and evaluation with this pt on herceptin with known cardiac history.

## 2016-08-29 ENCOUNTER — Other Ambulatory Visit: Payer: Self-pay | Admitting: Oncology

## 2016-08-29 DIAGNOSIS — C50911 Malignant neoplasm of unspecified site of right female breast: Secondary | ICD-10-CM

## 2016-08-29 MED FILL — ANASTROZOLE 1 MG TABLET: 1 | 90 days supply | Qty: 90 | Fill #0

## 2016-09-02 ENCOUNTER — Ambulatory Visit (HOSPITAL_COMMUNITY)
Admission: RE | Admit: 2016-09-02 | Discharge: 2016-09-02 | Disposition: A | Discharge status: Home / Self Care | Payer: Medicare Other | Source: Ambulatory Visit | Attending: Physician Assistant | Admitting: Physician Assistant

## 2016-09-02 ENCOUNTER — Ambulatory Visit (HOSPITAL_BASED_OUTPATIENT_CLINIC_OR_DEPARTMENT_OTHER)
Admission: RE | Admit: 2016-09-02 | Discharge: 2016-09-02 | Disposition: A | Discharge status: Home / Self Care | Payer: Medicare Other | Source: Ambulatory Visit | Attending: Internal Medicine | Admitting: Internal Medicine

## 2016-09-02 VITALS — BP 144/68 | HR 71 | Wt 206.2 lb

## 2016-09-02 DIAGNOSIS — C7800 Secondary malignant neoplasm of unspecified lung: Secondary | ICD-10-CM | POA: Diagnosis not present

## 2016-09-02 DIAGNOSIS — Z9013 Acquired absence of bilateral breasts and nipples: Secondary | ICD-10-CM | POA: Diagnosis not present

## 2016-09-02 DIAGNOSIS — Z8249 Family history of ischemic heart disease and other diseases of the circulatory system: Secondary | ICD-10-CM | POA: Diagnosis not present

## 2016-09-02 DIAGNOSIS — M7989 Other specified soft tissue disorders: Secondary | ICD-10-CM | POA: Insufficient documentation

## 2016-09-02 DIAGNOSIS — Z888 Allergy status to other drugs, medicaments and biological substances status: Secondary | ICD-10-CM | POA: Diagnosis not present

## 2016-09-02 DIAGNOSIS — C7931 Secondary malignant neoplasm of brain: Secondary | ICD-10-CM | POA: Diagnosis not present

## 2016-09-02 DIAGNOSIS — C50911 Malignant neoplasm of unspecified site of right female breast: Secondary | ICD-10-CM

## 2016-09-02 DIAGNOSIS — Z86711 Personal history of pulmonary embolism: Secondary | ICD-10-CM | POA: Diagnosis not present

## 2016-09-02 DIAGNOSIS — I10 Essential (primary) hypertension: Secondary | ICD-10-CM

## 2016-09-02 DIAGNOSIS — Z79899 Other long term (current) drug therapy: Secondary | ICD-10-CM | POA: Insufficient documentation

## 2016-09-02 DIAGNOSIS — K219 Gastro-esophageal reflux disease without esophagitis: Secondary | ICD-10-CM | POA: Diagnosis not present

## 2016-09-02 DIAGNOSIS — I313 Pericardial effusion (noninflammatory): Secondary | ICD-10-CM | POA: Insufficient documentation

## 2016-09-02 DIAGNOSIS — Z170 Estrogen receptor positive status [ER+]: Secondary | ICD-10-CM | POA: Diagnosis not present

## 2016-09-02 DIAGNOSIS — C7951 Secondary malignant neoplasm of bone: Secondary | ICD-10-CM | POA: Diagnosis not present

## 2016-09-02 DIAGNOSIS — I5032 Chronic diastolic (congestive) heart failure: Secondary | ICD-10-CM

## 2016-09-02 DIAGNOSIS — I110 Hypertensive heart disease with heart failure: Secondary | ICD-10-CM | POA: Insufficient documentation

## 2016-09-02 DIAGNOSIS — C50919 Malignant neoplasm of unspecified site of unspecified female breast: Secondary | ICD-10-CM | POA: Insufficient documentation

## 2016-09-02 DIAGNOSIS — Z800 Family history of malignant neoplasm of digestive organs: Secondary | ICD-10-CM | POA: Insufficient documentation

## 2016-09-02 DIAGNOSIS — I3139 Other pericardial effusion (noninflammatory): Secondary | ICD-10-CM

## 2016-09-02 DIAGNOSIS — C50912 Malignant neoplasm of unspecified site of left female breast: Secondary | ICD-10-CM | POA: Diagnosis not present

## 2016-09-02 DIAGNOSIS — Z803 Family history of malignant neoplasm of breast: Secondary | ICD-10-CM | POA: Diagnosis not present

## 2016-09-02 MED ORDER — FUROSEMIDE 20 MG PO TABS: 20.0000 mg | ORAL_TABLET | Freq: Every day | ORAL | 12 refills | Status: AC | PRN

## 2016-09-02 MED ORDER — SPIRONOLACTONE 25 MG PO TABS: 12.5000 mg | ORAL_TABLET | Freq: Every day | ORAL | 3 refills | Status: AC

## 2016-09-02 MED FILL — FUROSEMIDE 20 MG TABLET: 20 | 30 days supply | Qty: 30 | Fill #0

## 2016-09-02 MED FILL — SPIRONOLACTONE 25 MG TABLET: 25 | 30 days supply | Qty: 15 | Fill #0

## 2016-09-02 NOTE — Patient Instructions (Signed)
START Spironolactone 12.5 mg daily  Take Lasix 20mg  daily as needed  Stop Potassium  Follow up and echo with Dr.Bensimhon in 6 months

## 2016-09-02 NOTE — Addendum Note (Signed)
Encounter addended by: Harvie Junior, CMA on: 09/02/2016 12:26 PM<BR>    Actions taken: Medication long-term status modified, Order list changed, Diagnosis association updated, Sign clinical note

## 2016-09-02 NOTE — Progress Notes (Signed)
Patient ID: Linda [name], female   DOB: 26-Jul-1945, 71 y.o.   MRN: [id number]  Primary Physician: Leeanne Rio, PA-C Primary Cardiologist: Dr Haroldine Laws Oncologist: Magrinat  HPI:  Linda [name] is a 71 year-old [name] woman with metastatic breast CA status post bilateral mastectomies 8/13.Post-mastectomy radiation completed 04/15/2012  She had cyclophosphamide and doxorubicin in dose dense fashion x4, followed by paclitaxel and trastuzumab again every 2 weeks x4, with the trastuzumab  completed 11/01/2012.  Started anastrazole 04/16/2012  Hospitalized 09/10/2014>>09/12/2014 with pleural effusion, s/p thoracentesis w/ 900 cc out. CTA showed pericardial effusion. Effusion (NZB [id number]) showed malignant cells which were HER-2 positive.   Started trastuzumab/pertuzumab 09/26/2014, to be repeated every 21 days indefinitely.   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 Started whole brain radiation on 10/09/14. On Decadron.   Admitted 06/29>>7/8 w/massive pericardial effusion and volume overload. Underwent pericardial window (malingant) and thoracentesis. Diuresed about 40 pounds. Placed on Hospice care. Has since started salvage chemo RX.    She returns for yearly follow up. Currently getting TDM-1 (edo-trastuzamab) and anastrazole. Tolerating well. Feels very good. No CP, SOB or orthopnea. Does have some mild extremity swelling after being off lasix for the past month. Weight down about 10 pounds. SBP at [place] 130-140s recently    Labs: 01/02/15 Creatinine 0.7  Potassium 4.5 Labs: 06/19/2015: K 3.8 Creatinine 0.7 Hgb 12.0   ECHO 6/17 EF 60-65% large pericardial effusion without tamponade ECHO 01/2016 EF 60-65%  ECHO 09/02/16 EF 60-65% Grade 1 DD. LS' 10.2 GLS -18.6%   Past Medical History:  Diagnosis Date  . Acute on chronic diastolic CHF (congestive heart failure) ([place])   . Acute pulmonary embolism ([facility] [place])  12/13/2014  . Allergy   . Blood transfusion without reported diagnosis   . Breast cancer ([facility]) 07/30/11 dx   Right  invasive ductal ca 7 0'clock,& left breast=invasive ductal ca  and dcis, left axilla nlymph node, metastatic ca  . Bronchitis    hx  . Cancer ([place]) 08-13-11   07-31-11-Dx. Bilarteral Breast cancer-left greater than rt.  . Cancer of central portion of left female breast ([facility]) 08/01/2011  . GERD (gastroesophageal reflux disease)    doing well  . Hematuria, undiagnosed cause 08-13-11   Being evaluated by [facility] 08-14-11  . History of radiation therapy 02/20/12-04/15/12   left breast,total 60.4 Gy  . Hypertension   . Radiation-induced dermatitis 03/26/2012   Using radioplex cream, plus neosporin.   . S/P radiation therapy 10/09/14-10/27/14   whole brain 37.5Gy/46f  . Seasonal allergies   . Seroma 02/04/12   right breast  200cc removed  erythema on right side  . Wears partial dentures    wears upper and lower partial    Current Outpatient Prescriptions  Medication Sig Dispense Refill  . anastrozole (ARIMIDEX) 1 MG tablet Take 1 tablet (1 mg total) by mouth daily. 90 tablet 3  . b complex vitamins tablet Take 1 tablet by mouth daily.    . cholecalciferol (VITAMIN D) 1000 UNITS tablet Take 1 tablet (1,000 Units total) by mouth daily. 100 tablet 12  . docusate sodium (COLACE) 100 MG capsule Take 1 capsule (100 mg total) by mouth 2 (two) times daily. 10 capsule 0  . magnesium oxide (MAG-OX) 400 (241.3 Mg) MG tablet Take 1 tablet (400 mg total) by mouth daily. 30 tablet 12  . Multiple Vitamins-Iron (MULTIVITAMINS WITH IRON) TABS Take 1 tablet by mouth daily.    .[name]  potassium chloride SA (K-DUR,KLOR-CON) 20 MEQ tablet Take 1 tablet (20 mEq total) by mouth daily. 30 tablet 12   No current facility-administered medications for this encounter.     Allergies  Allergen Reactions  . Ace Inhibitors Cough      Social History   Social History  . Marital status: Single     Spouse name: N/A  . Number of children: 3  . Years of education: N/A   Occupational History  . Retired    Social History Main Topics  . Smoking status: Never Smoker  . Smokeless tobacco: Never Used  . Alcohol use 0.0 oz/week     Comment: rare- occ.  . Drug use: No  . Sexual activity: No     Comment: gxp3, 1st child age 31, menopause mid 53's,no hrt   Other Topics Concern  . Not on file   Social History Narrative   Lives with son.  Single.  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 She moved to [place] about 2 years ago but has a Games developer in [place]. Son Gerald Stabs lives in [place] and works as a Airline pilot. His wife is a Marine scientist. Son Shanon Brow lives at [place] and is a Advertising account executive in addition to having the [employer]. The patient attends a local [place] here.      Family History  Problem Relation Age of Onset  . Heart disease Mother   . Cancer Mother 43    breast, , 55 deceased  . Heart attack Father   . Cancer Sister 60    breast  . Colon cancer Neg Hx     Vitals:   09/02/16 1120  BP: (!) 144/68  Pulse: 71  SpO2: 100%  Weight: 206 lb 4 oz (93.6 kg)    PHYSICAL EXAM: General:  No respiratory difficulty HEENT: normal. alopecic Neck: supple. no JVD. Carotids 2+ bilat; no bruits. No lymphadenopathy or thryomegaly appreciated. Cor: PMI nondisplaced. Regular rate & rhythm. No rubs, gallops or murmurs. Lungs: clear. Decreased left base Abdomen: soft, nontender, nondistended. No hepatosplenomegaly. No bruits or masses. Good bowel sounds. Extremities: no cyanosis, clubbing, rash, edema mild lymphedema in L arm Neuro: alert & oriented x 3, cranial nerves grossly intact. moves all 4 extremities w/o difficulty. Affect pleasa   ASSESSMENT & PLAN: 1. Metastatic/recurrent breast CA, stage IV s/p salvage chemo  --I reviewed echos personally. EF  and Doppler parameters stable. No HF on exam. Continue TDM-1 (Kadcyla) 2. Chronic diastolic HF  - Volume status slightly elevated after stopping lasix. BP also high and K low - Will start low dose spironolactone at 12.42m daily. Watch potassium levels. Can add lasix as needed - will give her prescription to have at home 3. Large hemorrhagic pericardial effusion (malignant) with early tamponade  -s/p window 6/30    -no recurrence on echo today 4. HTN    - BP mildly elevated. Starting spiro.   Follow up in 6 months with an ECHO .   BGlori Bickers MD  12:06 PM

## 2016-09-03 ENCOUNTER — Ambulatory Visit: Payer: Self-pay

## 2016-09-03 ENCOUNTER — Other Ambulatory Visit: Payer: Self-pay

## 2016-09-03 ENCOUNTER — Ambulatory Visit: Payer: Self-pay | Admitting: Oncology

## 2016-09-10 ENCOUNTER — Other Ambulatory Visit (HOSPITAL_BASED_OUTPATIENT_CLINIC_OR_DEPARTMENT_OTHER): Payer: Medicare Other

## 2016-09-10 ENCOUNTER — Ambulatory Visit (HOSPITAL_BASED_OUTPATIENT_CLINIC_OR_DEPARTMENT_OTHER): Payer: Medicare Other | Admitting: Oncology

## 2016-09-10 ENCOUNTER — Ambulatory Visit (HOSPITAL_BASED_OUTPATIENT_CLINIC_OR_DEPARTMENT_OTHER): Payer: Medicare Other

## 2016-09-10 VITALS — BP 143/52 | HR 76 | Temp 97.8°F | Resp 18 | Ht 69.0 in | Wt 215.1 lb

## 2016-09-10 DIAGNOSIS — C50919 Malignant neoplasm of unspecified site of unspecified female breast: Secondary | ICD-10-CM

## 2016-09-10 DIAGNOSIS — Z170 Estrogen receptor positive status [ER+]: Secondary | ICD-10-CM | POA: Diagnosis not present

## 2016-09-10 DIAGNOSIS — C50911 Malignant neoplasm of unspecified site of right female breast: Secondary | ICD-10-CM | POA: Diagnosis present

## 2016-09-10 DIAGNOSIS — Z79811 Long term (current) use of aromatase inhibitors: Secondary | ICD-10-CM | POA: Diagnosis not present

## 2016-09-10 DIAGNOSIS — C7951 Secondary malignant neoplasm of bone: Secondary | ICD-10-CM | POA: Diagnosis not present

## 2016-09-10 DIAGNOSIS — C50912 Malignant neoplasm of unspecified site of left female breast: Secondary | ICD-10-CM | POA: Diagnosis not present

## 2016-09-10 DIAGNOSIS — C7931 Secondary malignant neoplasm of brain: Secondary | ICD-10-CM

## 2016-09-10 DIAGNOSIS — Z86711 Personal history of pulmonary embolism: Secondary | ICD-10-CM

## 2016-09-10 DIAGNOSIS — C50811 Malignant neoplasm of overlapping sites of right female breast: Secondary | ICD-10-CM

## 2016-09-10 DIAGNOSIS — Z5111 Encounter for antineoplastic chemotherapy: Secondary | ICD-10-CM | POA: Diagnosis present

## 2016-09-10 DIAGNOSIS — J910 Malignant pleural effusion: Secondary | ICD-10-CM

## 2016-09-10 DIAGNOSIS — C7800 Secondary malignant neoplasm of unspecified lung: Principal | ICD-10-CM

## 2016-09-10 DIAGNOSIS — C50812 Malignant neoplasm of overlapping sites of left female breast: Secondary | ICD-10-CM

## 2016-09-10 DIAGNOSIS — C50921 Malignant neoplasm of unspecified site of right male breast: Secondary | ICD-10-CM

## 2016-09-10 DIAGNOSIS — C50922 Malignant neoplasm of unspecified site of left male breast: Secondary | ICD-10-CM

## 2016-09-10 LAB — COMPREHENSIVE METABOLIC PANEL
ALT: 40 U/L (ref 0–55)
AST: 52 U/L — AB (ref 5–34)
Albumin: 3.1 g/dL — ABNORMAL LOW (ref 3.5–5.0)
Alkaline Phosphatase: 136 U/L (ref 40–150)
Anion Gap: 7 mEq/L (ref 3–11)
BUN: 12.6 mg/dL (ref 7.0–26.0)
CALCIUM: 9.9 mg/dL (ref 8.4–10.4)
CHLORIDE: 105 meq/L (ref 98–109)
CO2: 27 mEq/L (ref 22–29)
CREATININE: 0.7 mg/dL (ref 0.6–1.1)
EGFR: 90 mL/min/{1.73_m2} — ABNORMAL LOW (ref >90)
Glucose: 125 mg/dl (ref 70–140)
Potassium: 3.7 mEq/L (ref 3.5–5.1)
Sodium: 140 mEq/L (ref 136–145)
Total Bilirubin: 0.81 mg/dL (ref 0.20–1.20)
Total Protein: 7.3 g/dL (ref 6.4–8.3)

## 2016-09-10 LAB — CBC WITH DIFFERENTIAL/PLATELET
BASO%: 0.4 % (ref 0.0–2.0)
Basophils Absolute: 0 10*3/uL (ref 0.0–0.1)
EOS%: 2.4 % (ref 0.0–7.0)
Eosinophils Absolute: 0.1 10*3/uL (ref 0.0–0.5)
HEMATOCRIT: 33.7 % — AB (ref 34.8–46.6)
HEMOGLOBIN: 10.9 g/dL — AB (ref 11.6–15.9)
LYMPH#: 1.5 10*3/uL (ref 0.9–3.3)
LYMPH%: 32.7 % (ref 14.0–49.7)
MCH: 31.1 pg (ref 25.1–34.0)
MCHC: 32.3 g/dL (ref 31.5–36.0)
MCV: 96.3 fL (ref 79.5–101.0)
MONO#: 0.5 10*3/uL (ref 0.1–0.9)
MONO%: 11.9 % (ref 0.0–14.0)
NEUT%: 52.6 % (ref 38.4–76.8)
NEUTROS ABS: 2.4 10*3/uL (ref 1.5–6.5)
Platelets: 139 10*3/uL — ABNORMAL LOW (ref 145–400)
RBC: 3.5 10*6/uL — ABNORMAL LOW (ref 3.70–5.45)
RDW: 17.3 % — AB (ref 11.2–14.5)
WBC: 4.5 10*3/uL (ref 3.9–10.3)

## 2016-09-10 MED ORDER — ACETAMINOPHEN 325 MG PO TABS
650.0000 mg | ORAL_TABLET | Freq: Once | ORAL | Status: AC
  Administered 2016-09-10: 650 mg via ORAL

## 2016-09-10 MED ORDER — ADO-TRASTUZUMAB EMTANSINE CHEMO INJECTION 160 MG
360.0000 mg | Freq: Once | INTRAVENOUS | Status: AC
  Administered 2016-09-10: 360 mg via INTRAVENOUS
  Filled 2016-09-10: qty 8

## 2016-09-10 MED ORDER — DIPHENHYDRAMINE HCL 25 MG PO CAPS
25.0000 mg | ORAL_CAPSULE | Freq: Once | ORAL | Status: AC
  Administered 2016-09-10: 25 mg via ORAL

## 2016-09-10 MED ORDER — SODIUM CHLORIDE 0.9 % IJ SOLN
10.0000 mL | INTRAMUSCULAR | Status: DC | PRN
  Administered 2016-09-10: 10 mL
  Filled 2016-09-10: qty 10

## 2016-09-10 MED ORDER — HEPARIN SOD (PORK) LOCK FLUSH 100 UNIT/ML IV SOLN
500.0000 [IU] | Freq: Once | INTRAVENOUS | Status: AC | PRN
  Administered 2016-09-10: 500 [IU]
  Filled 2016-09-10: qty 5

## 2016-09-10 MED ORDER — DIPHENHYDRAMINE HCL 25 MG PO CAPS
ORAL_CAPSULE | ORAL | Status: AC
  Filled 2016-09-10: qty 1

## 2016-09-10 MED ORDER — ACETAMINOPHEN 325 MG PO TABS
ORAL_TABLET | ORAL | Status: AC
  Filled 2016-09-10: qty 2

## 2016-09-10 MED ORDER — SODIUM CHLORIDE 0.9 % IV SOLN
Freq: Once | INTRAVENOUS | Status: AC
  Administered 2016-09-10: 11:00:00 via INTRAVENOUS

## 2016-09-10 NOTE — Patient Instructions (Signed)
[facility] Discharge Instructions for Patients Receiving Chemotherapy  Today you received the following chemotherapy agents:  Kadcyla  To help prevent nausea and vomiting after your treatment, we encourage you to take your nausea medication as prescribed.   If you develop nausea and vomiting that is not controlled by your nausea medication, call the clinic.   BELOW ARE SYMPTOMS THAT SHOULD BE REPORTED IMMEDIATELY:  *FEVER GREATER THAN 100.5 F  *CHILLS WITH OR WITHOUT FEVER  NAUSEA AND VOMITING THAT IS NOT CONTROLLED WITH YOUR NAUSEA MEDICATION  *UNUSUAL SHORTNESS OF BREATH  *UNUSUAL BRUISING OR BLEEDING  TENDERNESS IN MOUTH AND THROAT WITH OR WITHOUT PRESENCE OF ULCERS  *URINARY PROBLEMS  *BOWEL PROBLEMS  UNUSUAL RASH Items with * indicate a potential emergency and should be followed up as soon as possible.  Feel free to call the clinic you have any questions or concerns. The clinic phone number is [phone].  Please show the CHEMO ALERT CARD at check-in to the Emergency Department and triage nurse.   

## 2016-09-10 NOTE — Progress Notes (Signed)
ID: Linda [name]   DOB: [birth year]  MR#: [id number]  JAS#:[id number]  PCP: Leeanne Rio, PA-C GYN: SU: Coralie Keens OTHER MD: Crissie Reese, Linna Hoff Bensimhon  CHIEF COMPLAINT:  Bilateral Breast Cancers  CURRENT TREATMENT: Anastrozole, T-DM 1, zolendrnate  BREAST CANCER HISTORY: From the original intake note:  The patient noted a small amount of drainage from her left breast December of 2012. She brought it to her gynecologist's attention in January of 2013 and was set up for bilateral diagnostic mammography at the [facility] Center July 24, 2011. This was the patient's first ever mammogram. It showed a large irregular mass in the left retroareolar region extending to the nipple, with nipple retraction and skin thickening. This measured approximately 8.4 cm. It was associated with pleomorphic microcalcifications. By exam there was moderate distortion and retraction of the nipple with a large palpable ill-defined area in the retroareolar region. In the right right breast there was also a 2 cm hard mass palpated. Ultrasound of the right breast mass showed a complex cystic/solid area measuring 1.9 cm. Ultrasound of the right axilla was negative. Ultrasound of the left breast showed a large hypoechoic mass measuring at least 3.8 cm. The left axilla showed several adjacent abnormal appearing lymph nodes.  With this information biopsy of the right and left breast masses were obtained the same day, and showed ([id number])   (a) on the right, and invasive ductal carcinoma with papillary features, estrogen and progesterone receptor positive, HER-2 negative, with an MIB-1 of 10%.   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 The MIB-1 was 60% for the left-sided tumor.   With this information the patient was presented at the multidisciplinary breast cancer conference 08/06/2011. Subsequent  evaluation and treatments are as detailed below.  INTERVAL HISTORY: Linda [name] Returns today for follow-up of her triple positive breast cancer. She is tolerating anastrozole well  Hot flashes and vaginal dryness are not a major issue. She never developed the arthralgias or myalgias that many patients can experience on this medication. She obtains it at a good price.  She is also on T DM 1, with her most recent echocardiogram03/20/2018 showing a well-preserved ejection fraction.  She also continues on zolendronate.her most recent dose was 07/30/2016 she tolerates that with no side effects that she is aware of  REVIEW OF SYSTEMS: Linda [name] Is still working 3 days a week but plans to retire as of 09/30/2016. She was concerned because someone in her office told her she had Alzheimer's disease. 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 Nevertheless she is looking forward to retirement.She denies unusual headaches, visual changes, nausea, vomiting, or gait imbalance, though she did fall at her son's house about a month agowhen the dog and between her legs. Overall she feels "fine" and a detailed review of systems was otherwise entirely stable  PAST MEDICAL HISTORY: Past Medical History:  Diagnosis Date  . Acute on chronic diastolic CHF (congestive heart failure) ([place])   . Acute pulmonary embolism ([place]) 12/13/2014  . Allergy   . Blood transfusion without reported diagnosis   . Breast cancer ([place]) 07/30/11 dx   Right  invasive ductal ca 7 0'clock,& left breast=invasive ductal ca  and dcis, left axilla nlymph node, metastatic ca  . Bronchitis    hx  . Cancer ([place]) 08-13-11   07-31-11-Dx. Bilarteral Breast  cancer-left greater than rt.  . Cancer of central portion of left female breast ([place]) 08/01/2011  . GERD (gastroesophageal reflux disease)    doing well  . Hematuria, undiagnosed cause 08-13-11   Being evaluated by [facility] 08-14-11  .  History of radiation therapy 02/20/12-04/15/12   left breast,total 60.4 Gy  . Hypertension   . Radiation-induced dermatitis 03/26/2012   Using radioplex cream, plus neosporin.   . S/P radiation therapy 10/09/14-10/27/14   whole brain 37.5Gy/45f  . Seasonal allergies   . Seroma 02/04/12   right breast  200cc removed  erythema on right side  . Wears partial dentures    wears upper and lower partial    PAST SURGICAL HISTORY: Past Surgical History:  Procedure Laterality Date  . BREAST SURGERY    . CHEST TUBE INSERTION Left 12/14/2014   Procedure: CHEST TUBE INSERTION;  Surgeon: CRexene Alberts MD;  [facility]  Service: Thoracic;  Laterality: Left;  . child birth  08-13-11   x3 -NVD  . MASTECTOMY W/ SENTINEL NODE BIOPSY  01/21/2012   Procedure: MASTECTOMY WITH SENTINEL LYMPH NODE BIOPSY;  Surgeon: DHarl Bowie MD;  [facility]  Service: General;  Laterality: Bilateral;  Left modified radical mastectomy, Rt mastectomy with Sentinel lymphnode biospy  . PORT-A-CATH REMOVAL Right 12/22/2012   Procedure: REMOVAL PORT-A-CATH;  Surgeon: DHarl Bowie MD;  [facility]  Service: General;  Laterality: Right;  . PORTACATH PLACEMENT  08/15/2011   Procedure: INSERTION PORT-A-CATH;  Surgeon: DHarl Bowie MD;  Location: [facility];  Service: General;  Laterality: N/A;  . SUBXYPHOID PERICARDIAL WINDOW N/A 12/14/2014   Procedure: SUBXYPHOID PERICARDIAL WINDOW;  Surgeon: CRexene Alberts MD;  [facility]  Service: Thoracic;  Laterality: N/A;  . TEE WITHOUT CARDIOVERSION N/A 12/14/2014   Procedure: TRANSESOPHAGEAL ECHOCARDIOGRAM (TEE);  Surgeon: CRexene Alberts MD;  [facility];  Service: Thoracic;  Laterality: N/A;    FAMILY HISTORY Family History  Problem Relation Age of Onset  . Heart disease Mother   . Cancer Mother 416   breast, , 793deceased  . Heart attack Father   . Cancer Sister 657   breast  . Colon cancer Neg Hx   The  patient's father died from a heart attack at the age of 832 The patient's mother died from apparently heart problems at the age of 843 The patient had no brothers. She has 3 sisters. One of her sisters was diagnosed with breast cancer in her mid 629s The patient does not know if his sister was ever genetically tested. The patient's mother had a mastectomy at the age of 322 presumably for breast cancer. There is no other history of breast or in cancer in the family to her knowledge.   GYNECOLOGIC HISTORY:  She does not recall when she had menarche. She had her first child at age 71 She is GX P3. She underwent menopause in her mid 455s She never took hormone replacement.   SOCIAL HISTORY:  She works seasonally for [employer] but is planning to retire as of April 1720. 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 She moved in with her son CGerald StabsSept 2017--[place] f[place]line pilot His wife is a nMarine scientistthere is a 966-monthld child in the house (as of March 2018) Son [facility]Shanon Brow[facility] at [facility] [employer] [employer]  is a Advertising account executive in addition to having the modular home sales business. The patient attends a local [employer] here   ADVANCED DIRECTIVES: Not in place.  Patient has been given the appropriate forms to complete and notarize at her discretion. She tells me she ihas named her daughter-in-law Jacqlyn Larsen as her healthcare power of attorney  HEALTH MAINTENANCE: Social History  Substance Use Topics  . Smoking status: Never Smoker  . Smokeless tobacco: Never Used  . Alcohol use 0.0 oz/week     Comment: rare- occ.     Colonoscopy: Never  PAP: Feb 2013  Bone density: November 2013, normal  Lipid panel:   Allergies  Allergen Reactions  . Ace Inhibitors Cough    Current Outpatient Prescriptions  Medication Sig Dispense Refill  . anastrozole (ARIMIDEX) 1 MG tablet Take 1 tablet (1 mg total) by mouth daily.  90 tablet 3  . b complex vitamins tablet Take 1 tablet by mouth daily.    . cholecalciferol (VITAMIN D) 1000 UNITS tablet Take 1 tablet (1,000 Units total) by mouth daily. 100 tablet 12  . docusate sodium (COLACE) 100 MG capsule Take 1 capsule (100 mg total) by mouth 2 (two) times daily. 10 capsule 0  . furosemide (LASIX) 20 MG tablet Take 1 tablet (20 mg total) by mouth daily as needed. 30 tablet 12  . magnesium oxide (MAG-OX) 400 (241.3 Mg) MG tablet Take 1 tablet (400 mg total) by mouth daily. 30 tablet 12  . Multiple Vitamins-Iron (MULTIVITAMINS WITH IRON) TABS Take 1 tablet by mouth daily.    [name] spironolactone (ALDACTONE) 25 MG tablet Take 0.5 tablets (12.5 mg total) by mouth daily. 15 tablet 3   No current facility-administered medications for this visit.     OBJECTIVE: [name]   Vitals:   09/10/16 0917  BP: (!) 143/52  Pulse: 76  Resp: 18  Temp: 97.8 F (36.6 C)     Body mass index is 31.76 kg/m.    ECOG FS: 1 Filed Weights   09/10/16 0917  Weight: 215 lb 1.6 oz (97.6 kg)    Sclerae unicteric, EOMs intact Oropharynx clear and moist No cervical or supraclavicular adenopathy Lungs no rales or rhonchi Heart regular rate and rhythm Abd soft, nontender, positive bowel sounds MSK no focal spinal tenderness, no upper extremity lymphedema Neuro: nonfocal, well oriented, appropriate affect Breasts: status post bilateral mastectomies. There is no evidence of chest wall recurrence. Both axillae are benign.    LAB RESULTS: CBC Latest Ref Rng & Units 09/10/2016 08/20/2016 07/30/2016  WBC 3.9 - 10.3 10e3/uL 4.5 5.0 4.4  Hemoglobin 11.6 - 15.9 g/dL 10.9(L) 10.9(L) 11.2(L)  Hematocrit 34.8 - 46.6 % 33.7(L) 32.6(L) 33.6(L)  Platelets 145 - 400 10e3/uL 139(L) 159 143(L)   CMP Latest Ref Rng & Units 09/10/2016 08/20/2016 07/30/2016  Glucose 70 - 140 mg/dl 125 98 134  BUN 7.0 - 26.0 mg/dL 12.6 13.6 10.4  Creatinine 0.6 - 1.1 mg/dL 0.7 0.6 0.7  Sodium 136 - 145 mEq/L 140  142 142  Potassium 3.5 - 5.1 mEq/L 3.7 3.9 3.7  Chloride 101 - 111 mmol/L - - -  CO2 22 - 29 mEq/L 27 31(H) 30(H)  Calcium 8.4 - 10.4 mg/dL 9.9 9.8 9.6  Total Protein 6.4 - 8.3 g/dL 7.3 7.7 7.4  Total Bilirubin 0.20 - 1.20 mg/dL 0.81 1.02 0.94  Alkaline Phos 40 - 150 U/L 136 153(H) 120  AST 5 - 34 U/L 52(H) 59(H) 42(H)  ALT 0 - 55  U/L 40 40 24    STUDIES: Most recent brain MRI 05/19/2016 was unremarkable and chest CT 05/07/2016 showed no evidence of disease.  ASSESSMENT: 71 y.o.  [place] woman   (1)  status post bilateral breast biopsies 07/24/2011, showing,     (a) on the right, a clinical T2 N0 papillary/ductal breast cancer, estrogen and progesterone receptor positive, HER-2 negative, with an MIB-1 of 10%;    (b) on the left, a clinical T3 N1, stage IIIA invasive ductal carcinoma, grade 3, triple positive, with an MIB-1 of 60%.  (2)  Status post 4 dose dense cycles of doxorubicin/ cyclophosphamide followed by 4 dose dense cycles of paclitaxel and trastuzumab completed 12/09/2011  (3) the trastuzumab was continued for a total of one year (to 11/01/2012). Final echo on 11/04/2012 showing a well preserved ejection fraction of 55-60%.  (4) s/p bilateral mastectomies 01/21/2012 showing  (a) on the Right, an 8 mm invasive papillary carcinoma, grade 1, ypT1b ypN0  (b) on the Left, multiple microscopic foci of residual  Invasive ductal carcinoma with evidence of dermal lymphatic involvement, pyT1a/T4 pyN0  (5)  Postmastectomy radiation, completed 04/15/2012  (6) Started anastrazole 04/16/2012; normal dexa scan 05/19/2014 at the [facility] 09/11/2014: brain, bones, left effusion (7) CT angiogram 09/11/2014 shows new left pericardial effusion and new mediastinal and hilar lymphadenopathy; there were no suspicious upper abdominal findings; Cytology from the left effusion 09/11/2014 ([id number]) shows malignant cells which are HER-2 positive  (8) Whole body bone  scan on 09/25/14 showed metastatic foci throughout axial and appendicular skeleton. Areas of most potential concern include disease in the thoracic spine, disease in the right acetabulum, right superior pubic ramus and possibly proximal right femur, disease in the right humeral shaft and in both femoral shafts.  (8) Started trastuzumab/pertuzumab 09/26/2014, discontinued after 11/29/2014 dose, w progression  (a) T-DM1 started 01/02/2015, repeated every 21 days  (b) echocardiogram 09/02/2016 hows an ejection fraction of 60-65 %  (9) Brain MRI on 09/25/14 was positive for numerous small enhancing brain mets and calvarium/bone mets   (a) whole brain radiation on 10/09/14--10/27/2014  (b) brain MRI 01/07/2015 shows a complete clinical response  (c) REPEAT BRAIN mri 03/18/2015 SHOWS NO NEW LESIONS  (d) repeat brain MRI with and without contrast 06/29/2015 continues to show no recurrent lesions in the brain  (e) brain MRI 10/12/2015 shows no evidence of recurrent brain metastases. She does have a large left mastoid effusion with trace right mastoid fluid.  (f) brain MRI 02/12/2016 stable--no evidence of active disease  (g) brain MRI 05/19/2016 stable, no evidence of active disease  (10) started zolendronate 11/29/2014, repeated every 12 weeks  11)  left pleural effusion re-tapped 12/08/2014 (a) left chest tube placement 12/14/2014, removedd 12/21/2014  (12) large pericardial effusion per echo 12/13/2014  (a) pericardial window placement 12/14/2014; fluid is hemorrhagic; path negative  (13) Right-sided pulmonary emboli noted on CT scan 12/13/2014-- never anticoagulated  (a) no pulmonary emboli demonstrated on non-dedicated chest CT 04/13/2015  PLAN: I spent approximately 30 minutes with Mardene Celeste with most of that time spent discussing her complex problems. We went over her treatment plan, and concerns regarding restaging studies. We also reviewed her upcoming  retirement.  Linda [name] is now 2 years out from definitive findings of metastatic breast cancer, with no evidence of disease activity. This is very favorable.  We discussed "chemo brain" and posttraumatic stress disorder issues, but basically I think her mental functioning is fine and she will simply coast through  the remaining 3 weeks of work after which of course she will retire  We discussed the fact that we now have data to do long-term anti-HER-2 treatment every 4 weeks and we will start that with her next cycle, which accordingly will be 10/09/2016.  Before that she will have a restaging MRI of the brain and CT of the chest. If that remains negative we would repeat dose studies 6 months later.  She knows to call for any other issues that may develop before her next visit here.    Chauncey Cruel, MD

## 2016-09-15 ENCOUNTER — Other Ambulatory Visit: Payer: Self-pay | Admitting: *Deleted

## 2016-09-15 DIAGNOSIS — C7949 Secondary malignant neoplasm of other parts of nervous system: Principal | ICD-10-CM

## 2016-09-15 DIAGNOSIS — C7931 Secondary malignant neoplasm of brain: Secondary | ICD-10-CM

## 2016-09-17 ENCOUNTER — Encounter (HOSPITAL_COMMUNITY): Payer: Self-pay | Admitting: *Deleted

## 2016-09-17 ENCOUNTER — Telehealth: Payer: Self-pay | Admitting: *Deleted

## 2016-09-17 ENCOUNTER — Emergency Department (HOSPITAL_COMMUNITY): Payer: Medicare Other

## 2016-09-17 ENCOUNTER — Other Ambulatory Visit: Payer: Self-pay

## 2016-09-17 ENCOUNTER — Inpatient Hospital Stay (HOSPITAL_COMMUNITY)
Admission: EM | Admit: 2016-09-17 | Discharge: 2016-09-27 | DRG: 175 | Disposition: A | Discharge status: Home / Self Care | Payer: Medicare Other | Attending: Internal Medicine | Admitting: Internal Medicine

## 2016-09-17 ENCOUNTER — Telehealth (HOSPITAL_COMMUNITY): Payer: Self-pay | Admitting: *Deleted

## 2016-09-17 DIAGNOSIS — I110 Hypertensive heart disease with heart failure: Secondary | ICD-10-CM | POA: Diagnosis present

## 2016-09-17 DIAGNOSIS — C50912 Malignant neoplasm of unspecified site of left female breast: Secondary | ICD-10-CM

## 2016-09-17 DIAGNOSIS — G919 Hydrocephalus, unspecified: Secondary | ICD-10-CM

## 2016-09-17 DIAGNOSIS — T148XXA Other injury of unspecified body region, initial encounter: Secondary | ICD-10-CM

## 2016-09-17 DIAGNOSIS — D696 Thrombocytopenia, unspecified: Secondary | ICD-10-CM | POA: Diagnosis present

## 2016-09-17 DIAGNOSIS — E876 Hypokalemia: Secondary | ICD-10-CM | POA: Diagnosis present

## 2016-09-17 DIAGNOSIS — K219 Gastro-esophageal reflux disease without esophagitis: Secondary | ICD-10-CM | POA: Diagnosis present

## 2016-09-17 DIAGNOSIS — R7881 Bacteremia: Secondary | ICD-10-CM | POA: Diagnosis present

## 2016-09-17 DIAGNOSIS — R55 Syncope and collapse: Secondary | ICD-10-CM | POA: Diagnosis not present

## 2016-09-17 DIAGNOSIS — W19XXXA Unspecified fall, initial encounter: Secondary | ICD-10-CM | POA: Diagnosis present

## 2016-09-17 DIAGNOSIS — C50112 Malignant neoplasm of central portion of left female breast: Secondary | ICD-10-CM | POA: Diagnosis present

## 2016-09-17 DIAGNOSIS — E785 Hyperlipidemia, unspecified: Secondary | ICD-10-CM | POA: Diagnosis present

## 2016-09-17 DIAGNOSIS — I2699 Other pulmonary embolism without acute cor pulmonale: Secondary | ICD-10-CM | POA: Diagnosis not present

## 2016-09-17 DIAGNOSIS — I10 Essential (primary) hypertension: Secondary | ICD-10-CM | POA: Diagnosis present

## 2016-09-17 DIAGNOSIS — Z6831 Body mass index (BMI) 31.0-31.9, adult: Secondary | ICD-10-CM

## 2016-09-17 DIAGNOSIS — I5032 Chronic diastolic (congestive) heart failure: Secondary | ICD-10-CM | POA: Diagnosis present

## 2016-09-17 DIAGNOSIS — C50919 Malignant neoplasm of unspecified site of unspecified female breast: Secondary | ICD-10-CM | POA: Diagnosis present

## 2016-09-17 DIAGNOSIS — D649 Anemia, unspecified: Secondary | ICD-10-CM | POA: Diagnosis present

## 2016-09-17 DIAGNOSIS — L03114 Cellulitis of left upper limb: Secondary | ICD-10-CM | POA: Diagnosis present

## 2016-09-17 DIAGNOSIS — Z9181 History of falling: Secondary | ICD-10-CM

## 2016-09-17 DIAGNOSIS — T451X5A Adverse effect of antineoplastic and immunosuppressive drugs, initial encounter: Secondary | ICD-10-CM | POA: Diagnosis present

## 2016-09-17 DIAGNOSIS — C50911 Malignant neoplasm of unspecified site of right female breast: Secondary | ICD-10-CM | POA: Diagnosis present

## 2016-09-17 DIAGNOSIS — S300XXA Contusion of lower back and pelvis, initial encounter: Secondary | ICD-10-CM | POA: Diagnosis present

## 2016-09-17 DIAGNOSIS — Z923 Personal history of irradiation: Secondary | ICD-10-CM

## 2016-09-17 DIAGNOSIS — S066XAA Traumatic subarachnoid hemorrhage with loss of consciousness status unknown, initial encounter: Secondary | ICD-10-CM

## 2016-09-17 DIAGNOSIS — Z9013 Acquired absence of bilateral breasts and nipples: Secondary | ICD-10-CM

## 2016-09-17 DIAGNOSIS — Z170 Estrogen receptor positive status [ER+]: Secondary | ICD-10-CM

## 2016-09-17 DIAGNOSIS — Y92009 Unspecified place in unspecified non-institutional (private) residence as the place of occurrence of the external cause: Secondary | ICD-10-CM

## 2016-09-17 DIAGNOSIS — S066X9A Traumatic subarachnoid hemorrhage with loss of consciousness of unspecified duration, initial encounter: Secondary | ICD-10-CM

## 2016-09-17 DIAGNOSIS — C7931 Secondary malignant neoplasm of brain: Secondary | ICD-10-CM | POA: Diagnosis present

## 2016-09-17 DIAGNOSIS — Z79811 Long term (current) use of aromatase inhibitors: Secondary | ICD-10-CM

## 2016-09-17 DIAGNOSIS — E669 Obesity, unspecified: Secondary | ICD-10-CM | POA: Diagnosis present

## 2016-09-17 DIAGNOSIS — Z8249 Family history of ischemic heart disease and other diseases of the circulatory system: Secondary | ICD-10-CM

## 2016-09-17 DIAGNOSIS — M7989 Other specified soft tissue disorders: Secondary | ICD-10-CM

## 2016-09-17 DIAGNOSIS — K5900 Constipation, unspecified: Secondary | ICD-10-CM | POA: Diagnosis not present

## 2016-09-17 DIAGNOSIS — I890 Lymphedema, not elsewhere classified: Secondary | ICD-10-CM | POA: Diagnosis present

## 2016-09-17 DIAGNOSIS — Z803 Family history of malignant neoplasm of breast: Secondary | ICD-10-CM

## 2016-09-17 DIAGNOSIS — G911 Obstructive hydrocephalus: Secondary | ICD-10-CM | POA: Diagnosis present

## 2016-09-17 DIAGNOSIS — G9340 Encephalopathy, unspecified: Secondary | ICD-10-CM | POA: Diagnosis present

## 2016-09-17 DIAGNOSIS — R4182 Altered mental status, unspecified: Secondary | ICD-10-CM

## 2016-09-17 DIAGNOSIS — C7800 Secondary malignant neoplasm of unspecified lung: Secondary | ICD-10-CM | POA: Diagnosis present

## 2016-09-17 DIAGNOSIS — D6959 Other secondary thrombocytopenia: Secondary | ICD-10-CM | POA: Diagnosis present

## 2016-09-17 DIAGNOSIS — R200 Anesthesia of skin: Secondary | ICD-10-CM | POA: Diagnosis present

## 2016-09-17 DIAGNOSIS — C7951 Secondary malignant neoplasm of bone: Secondary | ICD-10-CM | POA: Diagnosis present

## 2016-09-17 DIAGNOSIS — Z79899 Other long term (current) drug therapy: Secondary | ICD-10-CM

## 2016-09-17 HISTORY — DX: Type 2 diabetes mellitus without complications: E11.9

## 2016-09-17 LAB — CBC
HEMATOCRIT: 31.5 % — AB (ref 36.0–46.0)
HEMOGLOBIN: 10.4 g/dL — AB (ref 12.0–15.0)
MCH: 31.2 pg (ref 26.0–34.0)
MCHC: 33 g/dL (ref 30.0–36.0)
MCV: 94.6 fL (ref 78.0–100.0)
Platelets: 87 10*3/uL — ABNORMAL LOW (ref 150–400)
RBC: 3.33 MIL/uL — AB (ref 3.87–5.11)
RDW: 17.7 % — ABNORMAL HIGH (ref 11.5–15.5)
WBC: 9 10*3/uL (ref 4.0–10.5)

## 2016-09-17 LAB — BASIC METABOLIC PANEL
Anion gap: 9 (ref 5–15)
BUN: 18 mg/dL (ref 6–20)
CHLORIDE: 99 mmol/L — AB (ref 101–111)
CO2: 26 mmol/L (ref 22–32)
Calcium: 9.1 mg/dL (ref 8.9–10.3)
Creatinine, Ser: 0.72 mg/dL (ref 0.44–1.00)
GFR calc non Af Amer: >60 mL/min (ref >60)
GLUCOSE: 164 mg/dL — AB (ref 65–99)
POTASSIUM: 3.4 mmol/L — AB (ref 3.5–5.1)
SODIUM: 134 mmol/L — AB (ref 135–145)

## 2016-09-17 LAB — I-STAT CG4 LACTIC ACID, ED: LACTIC ACID, VENOUS: 1.47 mmol/L (ref 0.5–1.9)

## 2016-09-17 LAB — TROPONIN I

## 2016-09-17 MED ORDER — HEPARIN (PORCINE) IN NACL 100-0.45 UNIT/ML-% IJ SOLN
1200.0000 [IU]/h | INTRAMUSCULAR | Status: DC
  Administered 2016-09-18 (×2): 1200 [IU]/h via INTRAVENOUS
  Filled 2016-09-17 (×2): qty 250

## 2016-09-17 MED ORDER — IOPAMIDOL (ISOVUE-370) INJECTION 76%
INTRAVENOUS | Status: AC
  Administered 2016-09-17: 100 mL
  Filled 2016-09-17: qty 100

## 2016-09-17 MED ORDER — HEPARIN BOLUS VIA INFUSION
5000.0000 [IU] | Freq: Once | INTRAVENOUS | Status: AC
  Administered 2016-09-18: 5000 [IU] via INTRAVENOUS
  Filled 2016-09-17: qty 5000

## 2016-09-17 MED ORDER — SODIUM CHLORIDE 0.9% FLUSH
10.0000 mL | INTRAVENOUS | Status: DC | PRN
  Administered 2016-09-19: 10 mL
  Administered 2016-09-22: 20 mL
  Administered 2016-09-24 – 2016-09-27 (×2): 10 mL
  Filled 2016-09-17 (×4): qty 40

## 2016-09-17 NOTE — ED Notes (Signed)
I triaged this pt earlier no change in her condition  She just returned from

## 2016-09-17 NOTE — ED Provider Notes (Addendum)
[facility] DEPT Provider Note   CSN: [id number] Arrival date & time: 09/17/16  1746     History   Chief Complaint Chief Complaint  Patient presents with  . Loss of Consciousness    HPI Linda [name] is a 71 y.o. female.  HPI   71 yo F with h/o HTN, HLD, PE, metastatic breast CA, h/o pericardial tamponade s/p window in 2016, here with left arm swelling and syncopal episode. Pt states that over the past 24 hours, she has developed acute onset of left arm warmth, redness, and swelling. She has a h/o breast cancer s/p left mastectomy and has chronic swelling but this has worsened. She describes a fullness sensation in her arm but no pain, no pain with movement. She also reports an episode last night when she became acutely SOB, dizzy, and disoriented. She was unable to walk down the stairs due to weakness. She was also acutely more confused. She told her son she "couldn't walk" and was disoriented (stated it was 2017, unsure what had happened). She slowly came back to herself and was well throughout the day today. She called her PCP who sent her to the ED. Currently denise any CP, SOB. No HA. No dizziness or vertigo.  Past Medical History:  Diagnosis Date  . Acute on chronic diastolic CHF (congestive heart failure) ([place])   . Acute pulmonary embolism ([place]) 12/13/2014  . Allergy   . Blood transfusion without reported diagnosis   . Breast cancer ([place]) 07/30/11 dx   Right  invasive ductal ca 7 0'clock,& left breast=invasive ductal ca  and dcis, left axilla nlymph node, metastatic ca  . Bronchitis    hx  . Cancer ([place]) 08-13-11   07-31-11-Dx. Bilarteral Breast cancer-left greater than rt.  . Cancer of central portion of left female breast ([place]) 08/01/2011  . GERD (gastroesophageal reflux disease)    doing well  . Hematuria, undiagnosed cause 08-13-11   Being evaluated by [facility] 08-14-11  . History of radiation therapy 02/20/12-04/15/12   left breast,total 60.4 Gy  .  Hypertension   . Radiation-induced dermatitis 03/26/2012   Using radioplex cream, plus neosporin.   . S/P radiation therapy 10/09/14-10/27/14   whole brain 37.5Gy/75fx  . Seasonal allergies   . Seroma 02/04/12   right breast  200cc removed  erythema on right side  . Wears partial dentures    wears upper and lower partial    Patient Active Problem List   Diagnosis Date Noted  . Genetic testing 10/12/2015  . Chronic diastolic heart failure ([place]) 07/02/2015  . Insomnia 03/28/2015  . Breast cancer in situ   . Hemorrhagic pericardial effusion   . Essential hypertension   . Breast cancer metastasized to lung ([place])   . Acute diastolic CHF (congestive heart failure) ([place])   . Acute on chronic diastolic CHF (congestive heart failure) ([place])   . Chest tube in place   . Pericardial effusion   . Hypokalemia   . Hypomagnesemia   . Pericardial effusion with cardiac tamponade 12/13/2014  . Pericardial tamponade 12/13/2014  . Acute pulmonary embolism ([place]) 12/13/2014  . Peripheral edema 12/11/2014  . Oliguria 12/11/2014  . Pleural effusion, left 12/08/2014  . Pleural effusion 12/08/2014  . Anasarca 12/08/2014  . SOB (shortness of breath) 12/08/2014  . Left upper extremity swelling   . Pleural effusion on left   . Brain metastases ([place]) 10/13/2014  . 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 Bilateral breast cancer ([place]) 09/10/2014  . Hypertension   .  GERD (gastroesophageal reflux disease)   . Seasonal allergies   . Cough 10/06/2011    Past Surgical History:  Procedure Laterality Date  . BREAST SURGERY    . CHEST TUBE INSERTION Left 12/14/2014   Procedure: CHEST TUBE INSERTION;  Surgeon: Rexene Alberts, MD;  Location: [facility];  Service: Thoracic;  Laterality: Left;  . child birth  08-13-11   x3 -NVD  . MASTECTOMY W/ SENTINEL NODE BIOPSY  01/21/2012   Procedure: MASTECTOMY WITH SENTINEL LYMPH NODE BIOPSY;  Surgeon: Harl Bowie, MD;  Location: [facility];  Service:  General;  Laterality: Bilateral;  Left modified radical mastectomy, Rt mastectomy with Sentinel lymphnode biospy  . PORT-A-CATH REMOVAL Right 12/22/2012   Procedure: REMOVAL PORT-A-CATH;  Surgeon: Harl Bowie, MD;  Location: [facility];  Service: General;  Laterality: Right;  . PORTACATH PLACEMENT  08/15/2011   Procedure: INSERTION PORT-A-CATH;  Surgeon: Harl Bowie, MD;  Location: [facility];  Service: General;  Laterality: N/A;  . SUBXYPHOID PERICARDIAL WINDOW N/A 12/14/2014   Procedure: SUBXYPHOID PERICARDIAL WINDOW;  Surgeon: Rexene Alberts, MD;  Location: [facility];  Service: Thoracic;  Laterality: N/A;  . TEE WITHOUT CARDIOVERSION N/A 12/14/2014   Procedure: TRANSESOPHAGEAL ECHOCARDIOGRAM (TEE);  Surgeon: Rexene Alberts, MD;  Location: [facility] OR;  Service: Thoracic;  Laterality: N/A;    OB History    No data available       Home Medications    Prior to Admission medications   Medication Sig Start Date End Date Taking? Authorizing Provider  anastrozole (ARIMIDEX) 1 MG tablet Take 1 tablet (1 mg total) by mouth daily. 08/29/16  Yes Chauncey Cruel, MD  b complex vitamins tablet Take 1 tablet by mouth daily.   Yes Historical Provider, MD  cholecalciferol (VITAMIN D) 1000 UNITS tablet Take 1 tablet (1,000 Units total) by mouth daily. 11/08/14  Yes Chauncey Cruel, MD  docusate sodium (COLACE) 100 MG capsule Take 1 capsule (100 mg total) by mouth 2 (two) times daily. 07/10/15  Yes Chauncey Cruel, MD  furosemide (LASIX) 20 MG tablet Take 1 tablet (20 mg total) by mouth daily as needed. Patient taking differently: Take 20 mg by mouth daily as needed for fluid.  09/02/16  Yes Jolaine Artist, MD  magnesium oxide (MAG-OX) 400 (241.3 Mg) MG tablet Take 1 tablet (400 mg total) by mouth daily. 08/23/15  Yes Jolaine Artist, MD  Multiple Vitamins-Iron (MULTIVITAMINS WITH IRON) TABS Take 1 tablet by mouth daily.   Yes Historical Provider, MD  spironolactone (ALDACTONE) 25 MG  tablet Take 0.5 tablets (12.5 mg total) by mouth daily. 09/02/16  Yes Jolaine Artist, MD    Family History Family History  Problem Relation Age of Onset  . Heart disease Mother   . Cancer Mother 61    breast, , 48 deceased  . Heart attack Father   . Cancer Sister 65    breast  . Colon cancer Neg Hx     Social History Social History  Substance Use Topics  . Smoking status: Never Smoker  . Smokeless tobacco: Never Used  . Alcohol use 0.0 oz/week     Comment: rare- occ.     Allergies   Patient has no known allergies.   Review of Systems Review of Systems  Constitutional: Positive for fatigue. Negative for fever.  HENT: Negative for congestion and rhinorrhea.   Eyes: Negative for visual disturbance.  Respiratory: Negative for cough and shortness of  breath.   Cardiovascular: Negative for chest pain.  Gastrointestinal: Negative for abdominal pain.  Genitourinary: Negative for flank pain.  Musculoskeletal: Negative for neck pain and neck stiffness.  Skin: Negative for rash.  Neurological: Positive for light-headedness.  All other systems reviewed and are negative.    Physical Exam Updated Vital Signs BP 136/61   Pulse 74   Temp 98.2 F (36.8 C)   Resp 19   Ht 5\' 9"  (1.753 m)   Wt 210 lb 7 oz (95.5 kg)   SpO2 95%   BMI 31.08 kg/m   Physical Exam  Constitutional: She is oriented to person, place, and time. She appears well-developed and well-nourished. No distress.  HENT:  Head: Normocephalic and atraumatic.  Eyes: Conjunctivae are normal. Pupils are equal, round, and reactive to light.  Neck: Neck supple.  Cardiovascular: Normal rate, regular rhythm and normal heart sounds.  Exam reveals no friction rub.   No murmur heard. Pulmonary/Chest: Effort normal and breath sounds normal. No respiratory distress. She has no wheezes. She has no rales. She exhibits no tenderness.  Right port site c/d/I. Mild erythema extending from left shoulder to middle chest. No  TTP over erythema. No fluctuance. No crepitance.  Abdominal: Soft. She exhibits no distension.  Musculoskeletal: She exhibits no edema.  Neurological: She is alert and oriented to person, place, and time. She exhibits normal muscle tone.  Skin: Skin is warm. Capillary refill takes less than 2 seconds.  Psychiatric: She has a normal mood and affect.  Nursing note and vitals reviewed.   UPPER EXTREMITY EXAM: LEFT  INSPECTION & PALPATION: Marked erythema throughout LUE with no induration or fluctuance. No TTP. No fluctuance. No crepitance.  SENSORY: Sensation is intact to light touch in:  Superficial radial nerve distribution (dorsal first web space) Median nerve distribution (tip of index finger)   Ulnar nerve distribution (tip of small finger)     MOTOR:  + Motor posterior interosseous nerve (thumb IP extension) + Anterior interosseous nerve (thumb IP flexion, index finger DIP flexion) + Radial nerve (wrist extension) + Median nerve (palpable firing thenar mass) + Ulnar nerve (palpable firing of first dorsal interosseous muscle)  VASCULAR: 2+ radial pulse Brisk capillary refill < 2 sec, fingers warm and well-perfused   Neurological Exam:  Mental Status: Alert and oriented to person, place, and time. Attention and concentration normal. Speech clear. Recent memory is intact. Cranial Nerves: Visual fields grossly intact. EOMI and PERRLA. No nystagmus noted. Facial sensation intact at forehead, maxillary cheek, and chin/mandible bilaterally. No facial asymmetry or weakness. Hearing grossly normal. Uvula is midline, and palate elevates symmetrically. Normal SCM and trapezius strength. Tongue midline without fasciculations. Motor: Muscle strength 5/5 in proximal and distal UE and LE bilaterally. No pronator drift. Muscle tone normal. Reflexes: 2+ and symmetrical in all four extremities.  Sensation: Intact to light touch in upper and lower extremities distally bilaterally.  Gait: Normal  without ataxia. Coordination: Normal FTN bilaterally.    ED Treatments / Results  Labs (all labs ordered are listed, but only abnormal results are displayed) Labs Reviewed  BASIC METABOLIC PANEL - Abnormal; Notable for the following:       Result Value   Sodium 134 (*)    Potassium 3.4 (*)    Chloride 99 (*)    Glucose, Bld 164 (*)    All other components within normal limits  CBC - Abnormal; Notable for the following:    RBC 3.33 (*)    Hemoglobin 10.4 (*)  HCT 31.5 (*)    RDW 17.7 (*)    Platelets 87 (*)    All other components within normal limits  CULTURE, BLOOD (ROUTINE X 2)  CULTURE, BLOOD (ROUTINE X 2)  TROPONIN I  BRAIN NATRIURETIC PEPTIDE  HEPARIN LEVEL (UNFRACTIONATED)  CBC  I-STAT CG4 LACTIC ACID, ED  I-STAT CG4 LACTIC ACID, ED    EKG  EKG Interpretation None       EKG: HR 78, PR 155, QRS 100, QTc 488. No acute St changes. No signs of significant right heart strain.  Radiology Dg Chest 2 View  Result Date: 09/17/2016 CLINICAL DATA:  Syncopal episode last night, BILATERAL breast cancer undergoing treatment, former smoker, hypertension EXAM: CHEST  2 VIEW COMPARISON:  C7 2017 FINDINGS: RIGHT jugular Port-A-Cath with tip projecting over SVC. Minimal enlargement of cardiac silhouette. Atherosclerotic calcification aorta. Mediastinal contours and pulmonary vascularity normal. Lungs clear. No infiltrate, pleural effusion or pneumothorax. Prior BILATERAL mastectomy and LEFT axillary node dissection. Bones demineralized. IMPRESSION: No acute abnormalities. Mild enlargement cardiac silhouette. Aortic atherosclerosis. Electronically Signed   By: Lavonia Dana M.D.   On: 09/17/2016 19:37   Ct Head Wo Contrast  Addendum Date: 09/17/2016   ADDENDUM REPORT: 09/17/2016 22:44 ADDENDUM: Critical Value/emergent results were called by telephone at the time of interpretation on 09/17/2016 at 10:44 pm to Dr. Duffy Bruce , who verbally acknowledged these results. Electronically  Signed   By: Ulyses Jarred M.D.   On: 09/17/2016 22:44   Result Date: 09/17/2016 CLINICAL DATA:  Dizziness and stage IV breast cancer EXAM: CT HEAD WITHOUT CONTRAST TECHNIQUE: Contiguous axial images were obtained from the base of the skull through the vertex without intravenous contrast. COMPARISON:  Brain MRI 05/19/2016 FINDINGS: Brain: There is focal hypoattenuation within the left cerebellar hemisphere. No intracranial hemorrhage. No midline shift or other mass effect. There is periventricular hypoattenuation compatible with chronic microvascular disease. Vascular: No hyperdense vessel or unexpected calcification. Skull: Normal visualized skull base, calvarium and extracranial soft tissues. Sinuses/Orbits: The left mastoid air cells are opacified. The paranasal sinuses are clear. Normal orbits. IMPRESSION: 1. Focal hypoattenuation within the left cerebellum is concerning for acute ischemia, particularly in the context of reported dizziness. No associated hemorrhage or mass effect. MRI may be considered for confirmation. 2. Confluent white matter hypoattenuation may be secondary to chronic microvascular ischemia or a post treatment effect. Electronically Signed: By: Ulyses Jarred M.D. On: 09/17/2016 22:39   Ct Angio Chest Pe W Or Wo Contrast  Result Date: 09/17/2016 CLINICAL DATA:  Left arm erythema and warmth starting last evening with dizziness onset last night. Stage IV breast cancer. EXAM: CT ANGIOGRAPHY CHEST WITH CONTRAST TECHNIQUE: Multidetector CT imaging of the chest was performed using the standard protocol during bolus administration of intravenous contrast. Multiplanar CT image reconstructions and MIPs were obtained to evaluate the vascular anatomy. CONTRAST:  100 cc Isovue 370 IV COMPARISON:  CT 05/06/2016 FINDINGS: Cardiovascular: Web-like filling defects in the segmental and subsegmental arteries to the lower lobes, best seen series 5, image 59 within the right lower lobe and series 5, image 52  in the left lower lobe. RV/LV ratio = 1.2. The heart is borderline enlarged without pericardial effusion. 4 cm ascending aortic aneurysm without dissection. There is aortic atherosclerosis and coronary arteriosclerosis. Mediastinum/Nodes: Small hiatal hernia. No mediastinal lymphadenopathy. Lungs/Pleura: Dependent atelectasis both lungs with scarring in the left upper lobe possibly radiation induced. No dominant mass within the lungs identified. No pulmonary consolidation or pneumothorax. Upper Abdomen: No acute  abnormality. Musculoskeletal: Port catheter projects over the anterior right chest wall with tip in the right atrium. Multifocal osteoblastic metastasis along the dorsal spine. Bilateral mastectomy changes. Left axillary lymph node dissection. Review of the MIP images confirms the above findings. IMPRESSION: 1. Positive for acute PE with CT evidence of right heart strain (RV/LV Ratio = 1.2) consistent with at least submassive (intermediate risk) PE. The presence of right heart strain has been associated with an increased risk of morbidity and mortality. Please activate Code PE by paging [phone]. Tiny web-like nonocclusive filling defects are seen to both lower lobes. Critical Value/emergent results were called by telephone at the time of interpretation on 09/17/2016 at 10:49 pm to the ED secretary with message to be relayed to Dr. Duffy Bruce. Direct telephone line left in case of any questions from this report. 2. Status post bilateral mastectomy with left upper lobe scarring and dependent atelectasis. 3. 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 Electronically Signed   By: Ashley Royalty M.D.   On: 09/17/2016 22:51    Procedures Procedures (including critical care time)  CRITICAL CARE Performed by: Evonnie Pat   Total critical care time: 35 minutes  Critical care time was exclusive of separately billable procedures and treating other  patients.  Critical care was necessary to treat or prevent imminent or life-threatening deterioration.  Critical care was time spent personally by me on the following activities: development of treatment plan with patient and/or surrogate as well as nursing, discussions with consultants, evaluation of patient's response to treatment, examination of patient, obtaining history from patient or surrogate, ordering and performing treatments and interventions, ordering and review of laboratory studies, ordering and review of radiographic studies, pulse oximetry and re-evaluation of patient's condition.    Medications Ordered in ED Medications  sodium chloride flush (NS) 0.9 % injection 10-40 mL (not administered)  heparin bolus via infusion 5,000 Units (not administered)  heparin ADULT infusion 100 units/mL (25000 units/250mL sodium chloride 0.45%) (not administered)  iopamidol (ISOVUE-370) 76 % injection (100 mLs  Contrast Given 09/17/16 2210)     Initial Impression / Assessment and Plan / ED Course  I have reviewed the triage vital signs and the nursing notes.  Pertinent labs & imaging results that were available during my care of the patient were reviewed by me and considered in my medical decision making (see chart for details).     71 yo F with h/o metastatic breast CA here with non-painful left arm swelling and redness, and near syncopal episode yesterday. On arrival, VSS. Exam as above. Regarding her left arm swelling, major concern is possible DVT given that it is not painful, also early cellulitis though WBC normal, LA normal, no TTP. Less likely drug reaction give asymmetry. No new contacts/exposures. Will need [place] in AM. Otherwise, re: syncopal episode, must consider PE in setting of LUE swelling, so CT Angio obtained and is pos for b/l PE with right heart strain. Normal trop, however, no hypoxia - doubt massive PE and per discussion with Dr. Detterding, no indication for tPA at this time  -will need TTE as inpt. Otherwise, she does report gait difficulty and CT shows possible acute infarct, possibly 2/2 her hypercoagulability 2/2 cancer. Will start heparin gtt, likely admit to step down. Of note, pt does have remote h/o pericardial effusion but no signs of this at this time, feel benefits of anticoag in setting of submassive PE outweigh risks.  D/w Dr. Blaine Hamper. Will d/w Neuro re:  CT findings. Admit to step down.  Discussed with Dr. Cheral Marker. He is in agreement with treatment of PE (heparin) at this time as this takes priority - will see as c/s.   Final Clinical Impressions(s) / ED Diagnoses   Final diagnoses:  Syncope  Other acute pulmonary embolism without acute cor pulmonale (HCC)  Left arm swelling    New Prescriptions New Prescriptions   No medications on file     Duffy Bruce, MD 09/18/16 0021    Duffy Bruce, MD 09/18/16 0201    Duffy Bruce, MD 09/18/16 [phone]

## 2016-09-17 NOTE — Progress Notes (Signed)
ANTICOAGULATION CONSULT NOTE - Initial Consult  Pharmacy Consult for Heparin Indication: pulmonary embolus  No Known Allergies  Patient Measurements: Height: 5\' 9"  (175.3 cm) Weight: 210 lb 7 oz (95.5 kg) IBW/kg (Calculated) : 66.2 Heparin Dosing Weight: 86 kg  Vital Signs: Temp: 98.2 F (36.8 C) (04/04 1815) BP: 136/61 (04/04 2300) Pulse Rate: 74 (04/04 2300)  Labs:  Recent Labs  09/17/16 1842  HGB 10.4*  HCT 31.5*  PLT 87*  CREATININE 0.72  TROPONINI <0.03    Estimated Creatinine Clearance: 80.5 mL/min (by C-G formula based on SCr of 0.72 mg/dL).   Medical History: Past Medical History:  Diagnosis Date  . Acute on chronic diastolic CHF (congestive heart failure) ([place])   . Acute pulmonary embolism ([place]) 12/13/2014  . Allergy   . Blood transfusion without reported diagnosis   . Breast cancer ([place]) 07/30/11 dx   Right  invasive ductal ca 7 0'clock,& left breast=invasive ductal ca  and dcis, left axilla nlymph node, metastatic ca  . Bronchitis    hx  . Cancer ([place]) 08-13-11   07-31-11-Dx. Bilarteral Breast cancer-left greater than rt.  . Cancer of central portion of left female breast ([place]) 08/01/2011  . GERD (gastroesophageal reflux disease)    doing well  . Hematuria, undiagnosed cause 08-13-11   Being evaluated by [facility] 08-14-11  . History of radiation therapy 02/20/12-04/15/12   left breast,total 60.4 Gy  . Hypertension   . Radiation-induced dermatitis 03/26/2012   Using radioplex cream, plus neosporin.   . S/P radiation therapy 10/09/14-10/27/14   whole brain 37.5Gy/27fx  . Seasonal allergies   . Seroma 02/04/12   right breast  200cc removed  erythema on right side  . Wears partial dentures    wears upper and lower partial    Medications:  See electronic med rec  Assessment: 71 y.o. F with metastatic breast cancer presents with LUE warmth/erythema. Also with LOC PTA. Chest CT positive for acute PE with CT evidence of right heart strain (RV/LV  Ratio = 1.2) consistent with at least submassive (intermediate risk) PE. Noted pt with h/o PE (11/2014) but never anticoagulated for this. Hgb low but stable at baseline. Plt down to 87 (noted they were 139 ~1 wk ago) - will watch carefully. To begin heparin gtt.  Goal of Therapy:  Heparin level 0.3-0.7 units/ml Monitor platelets by anticoagulation protocol: Yes   Plan:  Heparin IV bolus 5000 units Heparin gtt at 1200 units/hr Will f/u heparin level in 8 hours Daily heparin level and CBC  Sherlon Handing, PharmD, BCPS Clinical pharmacist, pager [phone] 09/17/2016,11:21 PM

## 2016-09-17 NOTE — ED Notes (Signed)
Orthostatic bps performed

## 2016-09-17 NOTE — Telephone Encounter (Signed)
Pt called about possible reaction to her Arlyce Harman.  Pt states her L arm is swollen from wrist to shoulder, it is discolored, dark red "like a dark sunburn"  She also states it is hot to touch.  Advised possible dvt, also discussed w/Dr Bensimhon recommend pt should go to ER for further eval, she is aware and agreeable.

## 2016-09-17 NOTE — Telephone Encounter (Signed)
"  I would like Dr. Virgie Dad nurse to call me.  I think I'm having a reaction to one of the medications I take."  Returned patient's call.  Received voicemail.  Called Enid Cutter listed as emergency contact and is patient's son. Same message left on both numbers to go to the ED if in respiratory distress or need emergency level of care.  Call [phone] with name of medication and symptoms experiencing.  Will reach after hours nurses if call after 4:30 pm.

## 2016-09-17 NOTE — ED Provider Notes (Signed)
MSE was initiated and I personally evaluated the patient and placed orders (if any) at  8:10 PM on September 17, 2016.  Linda [name] is a 71 year old female with a history of stage IV left breast cancer who presents to the emergency department with left arm warmth and erythema that began suddenly this morning when she awoke. She denies recent trauma or injury to the extremity.  Her son also reports that she was dizzy lightheaded and disoriented when she came home from work last night. He reports that his wife said that the patient was unable to walk so she attempted to carry her up the stairs. Her son reports that when he arrived at home that the patient was sitting at the top of the stairs and then laid down to rest. He reports that she was able to respond appropriately to verbal commands but seemed "off". He reports that he bear-hugged her and was able to get her into her chair, and later moved her onto the couch to sleep for the night because she was still unable to walk. He reports that she was complaining of left sided arm and leg numbness. The patient reports no recollection of events from the time she arrived home from work until this morning. Her son reports that she was able to ambulate and climb the stairs this morning.  Her son reports a similar episode she became acutely unable to ambulate and did not remember the episode in December 2017 while the family was on a trip to [place], and the episode resolved on its own. He reports he did not take her for medical evaluation because of the symptom was related to the long car trip.  She reports that she started taking spironolactone on 09/02/16. No other recent medication changes. She receives chemotherapy every 4 weeks; her next treatment is April 6/7.  The patient appears stable so that the remainder of the MSE may be completed by another provider.   Joanne Gavel, PA-C 09/17/16 2035    Duffy Bruce, MD 09/18/16 [phone]

## 2016-09-17 NOTE — ED Notes (Signed)
The pt reports that her tailbone is the only area that is painful

## 2016-09-17 NOTE — ED Notes (Signed)
c-t notified of  [place] cath  accessed

## 2016-09-17 NOTE — ED Triage Notes (Signed)
The pt has been falling a lot for the past month.  Last night she fell backward and then fell forward  She was disoriented for a few minutes according to the family  The sone thought that she had a stroke but she seemed ok after a litle while  She has redness and swelling to her entire lt arm after the fall with pain good radial pulse  The pt is alert and oriented skin warm and dry

## 2016-09-17 NOTE — ED Notes (Signed)
Iv team at the bedside 

## 2016-09-17 NOTE — ED Notes (Signed)
Pt has a power port  She is getting chemotherapy every 4 weeks for breast cancer

## 2016-09-17 NOTE — ED Notes (Signed)
Pt returned from  c-t alert no distress 

## 2016-09-17 NOTE — ED Notes (Signed)
The pt has returned from c-t

## 2016-09-18 ENCOUNTER — Inpatient Hospital Stay (HOSPITAL_COMMUNITY): Payer: Medicare Other

## 2016-09-18 ENCOUNTER — Encounter (HOSPITAL_COMMUNITY): Payer: Self-pay

## 2016-09-18 ENCOUNTER — Other Ambulatory Visit: Payer: Self-pay

## 2016-09-18 DIAGNOSIS — G911 Obstructive hydrocephalus: Secondary | ICD-10-CM | POA: Diagnosis present

## 2016-09-18 DIAGNOSIS — Z86711 Personal history of pulmonary embolism: Secondary | ICD-10-CM

## 2016-09-18 DIAGNOSIS — Z8249 Family history of ischemic heart disease and other diseases of the circulatory system: Secondary | ICD-10-CM | POA: Diagnosis not present

## 2016-09-18 DIAGNOSIS — E876 Hypokalemia: Secondary | ICD-10-CM

## 2016-09-18 DIAGNOSIS — J910 Malignant pleural effusion: Secondary | ICD-10-CM | POA: Diagnosis not present

## 2016-09-18 DIAGNOSIS — K219 Gastro-esophageal reflux disease without esophagitis: Secondary | ICD-10-CM | POA: Diagnosis present

## 2016-09-18 DIAGNOSIS — E785 Hyperlipidemia, unspecified: Secondary | ICD-10-CM | POA: Diagnosis present

## 2016-09-18 DIAGNOSIS — I110 Hypertensive heart disease with heart failure: Secondary | ICD-10-CM | POA: Diagnosis present

## 2016-09-18 DIAGNOSIS — I2699 Other pulmonary embolism without acute cor pulmonale: Secondary | ICD-10-CM | POA: Diagnosis present

## 2016-09-18 DIAGNOSIS — C7951 Secondary malignant neoplasm of bone: Secondary | ICD-10-CM | POA: Diagnosis present

## 2016-09-18 DIAGNOSIS — W19XXXA Unspecified fall, initial encounter: Secondary | ICD-10-CM | POA: Diagnosis present

## 2016-09-18 DIAGNOSIS — C7931 Secondary malignant neoplasm of brain: Secondary | ICD-10-CM | POA: Diagnosis present

## 2016-09-18 DIAGNOSIS — D696 Thrombocytopenia, unspecified: Secondary | ICD-10-CM | POA: Diagnosis present

## 2016-09-18 DIAGNOSIS — C50919 Malignant neoplasm of unspecified site of unspecified female breast: Secondary | ICD-10-CM

## 2016-09-18 DIAGNOSIS — C7800 Secondary malignant neoplasm of unspecified lung: Secondary | ICD-10-CM

## 2016-09-18 DIAGNOSIS — G9340 Encephalopathy, unspecified: Secondary | ICD-10-CM

## 2016-09-18 DIAGNOSIS — Y92009 Unspecified place in unspecified non-institutional (private) residence as the place of occurrence of the external cause: Secondary | ICD-10-CM | POA: Diagnosis not present

## 2016-09-18 DIAGNOSIS — M7989 Other specified soft tissue disorders: Secondary | ICD-10-CM

## 2016-09-18 DIAGNOSIS — Z6831 Body mass index (BMI) 31.0-31.9, adult: Secondary | ICD-10-CM | POA: Diagnosis not present

## 2016-09-18 DIAGNOSIS — C50912 Malignant neoplasm of unspecified site of left female breast: Secondary | ICD-10-CM | POA: Diagnosis not present

## 2016-09-18 DIAGNOSIS — R7881 Bacteremia: Secondary | ICD-10-CM | POA: Diagnosis present

## 2016-09-18 DIAGNOSIS — Z923 Personal history of irradiation: Secondary | ICD-10-CM | POA: Diagnosis not present

## 2016-09-18 DIAGNOSIS — I10 Essential (primary) hypertension: Secondary | ICD-10-CM | POA: Diagnosis not present

## 2016-09-18 DIAGNOSIS — K5900 Constipation, unspecified: Secondary | ICD-10-CM | POA: Diagnosis not present

## 2016-09-18 DIAGNOSIS — C50911 Malignant neoplasm of unspecified site of right female breast: Secondary | ICD-10-CM | POA: Diagnosis not present

## 2016-09-18 DIAGNOSIS — Z9013 Acquired absence of bilateral breasts and nipples: Secondary | ICD-10-CM | POA: Diagnosis not present

## 2016-09-18 DIAGNOSIS — Z79811 Long term (current) use of aromatase inhibitors: Secondary | ICD-10-CM | POA: Diagnosis not present

## 2016-09-18 DIAGNOSIS — E669 Obesity, unspecified: Secondary | ICD-10-CM | POA: Diagnosis present

## 2016-09-18 DIAGNOSIS — I5032 Chronic diastolic (congestive) heart failure: Secondary | ICD-10-CM | POA: Diagnosis present

## 2016-09-18 DIAGNOSIS — L03114 Cellulitis of left upper limb: Secondary | ICD-10-CM | POA: Diagnosis present

## 2016-09-18 DIAGNOSIS — Z170 Estrogen receptor positive status [ER+]: Secondary | ICD-10-CM | POA: Diagnosis not present

## 2016-09-18 DIAGNOSIS — R200 Anesthesia of skin: Secondary | ICD-10-CM | POA: Diagnosis present

## 2016-09-18 DIAGNOSIS — R55 Syncope and collapse: Secondary | ICD-10-CM | POA: Diagnosis present

## 2016-09-18 DIAGNOSIS — Z79899 Other long term (current) drug therapy: Secondary | ICD-10-CM | POA: Diagnosis not present

## 2016-09-18 DIAGNOSIS — D6959 Other secondary thrombocytopenia: Secondary | ICD-10-CM | POA: Diagnosis present

## 2016-09-18 DIAGNOSIS — D649 Anemia, unspecified: Secondary | ICD-10-CM | POA: Diagnosis present

## 2016-09-18 LAB — CBC
HCT: 30.8 % — ABNORMAL LOW (ref 36.0–46.0)
Hemoglobin: 10 g/dL — ABNORMAL LOW (ref 12.0–15.0)
MCH: 30.5 pg (ref 26.0–34.0)
MCHC: 32.5 g/dL (ref 30.0–36.0)
MCV: 93.9 fL (ref 78.0–100.0)
Platelets: 80 10*3/uL — ABNORMAL LOW (ref 150–400)
RBC: 3.28 MIL/uL — ABNORMAL LOW (ref 3.87–5.11)
RDW: 17.2 % — AB (ref 11.5–15.5)
WBC: 8.5 10*3/uL (ref 4.0–10.5)

## 2016-09-18 LAB — BASIC METABOLIC PANEL
Anion gap: 7 (ref 5–15)
BUN: 15 mg/dL (ref 6–20)
CALCIUM: 8.9 mg/dL (ref 8.9–10.3)
CHLORIDE: 104 mmol/L (ref 101–111)
CO2: 27 mmol/L (ref 22–32)
CREATININE: 0.6 mg/dL (ref 0.44–1.00)
GFR calc Af Amer: >60 mL/min (ref >60)
GFR calc non Af Amer: >60 mL/min (ref >60)
GLUCOSE: 113 mg/dL — AB (ref 65–99)
Potassium: 3.4 mmol/L — ABNORMAL LOW (ref 3.5–5.1)
Sodium: 138 mmol/L (ref 135–145)

## 2016-09-18 LAB — LIPID PANEL
CHOLESTEROL: 146 mg/dL (ref 0–200)
HDL: 32 mg/dL — ABNORMAL LOW (ref >40)
LDL CALC: 103 mg/dL — AB (ref 0–99)
TRIGLYCERIDES: 56 mg/dL (ref <150)
Total CHOL/HDL Ratio: 4.6 RATIO
VLDL: 11 mg/dL (ref 0–40)

## 2016-09-18 LAB — GLUCOSE, CAPILLARY
Glucose-Capillary: 105 mg/dL — ABNORMAL HIGH (ref 65–99)
Glucose-Capillary: 125 mg/dL — ABNORMAL HIGH (ref 65–99)

## 2016-09-18 LAB — BRAIN NATRIURETIC PEPTIDE: B NATRIURETIC PEPTIDE 5: 127.6 pg/mL — AB (ref 0.0–100.0)

## 2016-09-18 LAB — HEPARIN LEVEL (UNFRACTIONATED)
HEPARIN UNFRACTIONATED: 1 [IU]/mL — AB (ref 0.30–0.70)
Heparin Unfractionated: 0.6 IU/mL (ref 0.30–0.70)

## 2016-09-18 LAB — MRSA PCR SCREENING: MRSA by PCR: NEGATIVE

## 2016-09-18 MED ORDER — ZOLPIDEM TARTRATE 5 MG PO TABS: 5.0000 mg | ORAL_TABLET | Freq: Every evening | ORAL | Status: DC | PRN

## 2016-09-18 MED ORDER — MAGNESIUM OXIDE 400 (241.3 MG) MG PO TABS
400.0000 mg | ORAL_TABLET | Freq: Every day | ORAL | Status: DC
Start: 2016-09-18 — End: 2016-09-27
  Administered 2016-09-18 – 2016-09-27 (×9): 400 mg via ORAL
  Filled 2016-09-18 (×13): qty 1

## 2016-09-18 MED ORDER — SODIUM CHLORIDE 0.9% FLUSH
3.0000 mL | Freq: Two times a day (BID) | INTRAVENOUS | Status: DC
  Administered 2016-09-18 – 2016-09-26 (×10): 3 mL via INTRAVENOUS
  Administered 2016-09-26: 10 mL via INTRAVENOUS

## 2016-09-18 MED ORDER — ALBUTEROL SULFATE (2.5 MG/3ML) 0.083% IN NEBU: 2.5000 mg | INHALATION_SOLUTION | RESPIRATORY_TRACT | Status: DC | PRN

## 2016-09-18 MED ORDER — VITAMIN D 1000 UNITS PO TABS
1000.0000 [IU] | ORAL_TABLET | Freq: Every day | ORAL | Status: DC
  Administered 2016-09-18 – 2016-09-27 (×9): 1000 [IU] via ORAL
  Filled 2016-09-18 (×9): qty 1

## 2016-09-18 MED ORDER — CEPHALEXIN 500 MG PO CAPS
500.0000 mg | ORAL_CAPSULE | Freq: Three times a day (TID) | ORAL | Status: AC
Start: 2016-09-18 — End: 2016-09-23
  Administered 2016-09-18 – 2016-09-23 (×14): 500 mg via ORAL
  Filled 2016-09-18 (×15): qty 1

## 2016-09-18 MED ORDER — ANASTROZOLE 1 MG PO TABS
1.0000 mg | ORAL_TABLET | Freq: Every day | ORAL | Status: DC
  Administered 2016-09-19 – 2016-09-27 (×8): 1 mg via ORAL
  Filled 2016-09-18 (×11): qty 1

## 2016-09-18 MED ORDER — ONDANSETRON HCL 4 MG PO TABS: 4.0000 mg | ORAL_TABLET | Freq: Four times a day (QID) | ORAL | Status: DC | PRN

## 2016-09-18 MED ORDER — TAB-A-VITE/IRON PO TABS
1.0000 | ORAL_TABLET | Freq: Every day | ORAL | Status: DC
  Administered 2016-09-19 – 2016-09-27 (×8): 1 via ORAL
  Filled 2016-09-18 (×11): qty 1

## 2016-09-18 MED ORDER — B COMPLEX-C PO TABS
1.0000 | ORAL_TABLET | Freq: Every day | ORAL | Status: DC
  Administered 2016-09-19 – 2016-09-27 (×8): 1 via ORAL
  Filled 2016-09-18 (×10): qty 1

## 2016-09-18 MED ORDER — GADOBENATE DIMEGLUMINE 529 MG/ML IV SOLN
20.0000 mL | Freq: Once | INTRAVENOUS | Status: AC
  Administered 2016-09-18: 20 mL via INTRAVENOUS

## 2016-09-18 MED ORDER — ACETAMINOPHEN 325 MG PO TABS
650.0000 mg | ORAL_TABLET | Freq: Four times a day (QID) | ORAL | Status: DC | PRN
  Administered 2016-09-18 – 2016-09-21 (×6): 650 mg via ORAL
  Filled 2016-09-18 (×6): qty 2

## 2016-09-18 MED ORDER — ACETAMINOPHEN 650 MG RE SUPP: 650.0000 mg | Freq: Four times a day (QID) | RECTAL | Status: DC | PRN

## 2016-09-18 MED ORDER — POTASSIUM CHLORIDE 20 MEQ/15ML (10%) PO SOLN
20.0000 meq | Freq: Once | ORAL | Status: AC
  Administered 2016-09-18: 20 meq via ORAL
  Filled 2016-09-18: qty 15

## 2016-09-18 MED ORDER — ONDANSETRON HCL 4 MG/2ML IJ SOLN: 4.0000 mg | Freq: Four times a day (QID) | INTRAMUSCULAR | Status: DC | PRN

## 2016-09-18 MED ORDER — HEPARIN (PORCINE) IN NACL 100-0.45 UNIT/ML-% IJ SOLN: 900.0000 [IU]/h | INTRAMUSCULAR | Status: DC

## 2016-09-18 MED ORDER — DOCUSATE SODIUM 100 MG PO CAPS
100.0000 mg | ORAL_CAPSULE | Freq: Two times a day (BID) | ORAL | Status: DC
  Administered 2016-09-19 – 2016-09-26 (×13): 100 mg via ORAL
  Filled 2016-09-18 (×15): qty 1

## 2016-09-18 MED ORDER — PANTOPRAZOLE SODIUM 40 MG PO TBEC: 40.0000 mg | DELAYED_RELEASE_TABLET | Freq: Every day | ORAL | Status: DC | PRN

## 2016-09-18 MED ORDER — HYDRALAZINE HCL 20 MG/ML IJ SOLN
5.0000 mg | INTRAMUSCULAR | Status: DC | PRN
Start: 2016-09-18 — End: 2016-09-25
  Administered 2016-09-21 – 2016-09-25 (×4): 5 mg via INTRAVENOUS
  Filled 2016-09-18 (×4): qty 1

## 2016-09-18 NOTE — H&P (Signed)
History and Physical    Linda [name] DQQ:[id number] DOB: 01-06-1946 DOA: 09/17/2016  Referring MD/NP/PA:   PCP: No PCP Per Patient   Patient coming from:  The patient is coming from home.  At baseline, pt is independent for most of ADL.   Chief Complaint: Altered mental status, fall, left arm swelling and erythema  HPI: Linda [name] is a 71 y.o. 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  Patient has mild confusion, but oriented 3, answered most of my questions. Per patient's son, patient had multiple falls in the past several days, not sure if she injured her head. Patient is disoriented and more confused at home . She has right arm swelling and erythema from her shoulder to forearm, which started yesterday. Patient denies any pain in arms, no fever or chills. Patient also denies chest pain, SOB, tenderness in calf areas. No nausea, vomiting, diarrhea, abdominal pain, symptoms of UTI. Patient cannot walk normally per her son, but no facial droop, hearing loss or vision change.  ED Course: pt was found to have WBC 9.0, lactate 1.47, negative troponin, BNP 127.6, potassium 3.4, creatinine normal, temperature normal, oxygen saturation 87% on room air, negative chest x-ray. CT angiogram of chest showed submassive PE with evidence of right heart straining. Pt is admitted to [facility] as inpt. Neuro, dr. Cheral Marker was consulted by EDP.  # CT head showed: 1. Focal hypoattenuation within the left cerebellum is concerning for acute ischemia, particularly in the context of reported dizziness. No associated hemorrhage or mass effect. 2. Confluent white matter hypoattenuation may be secondary to chronic microvascular ischemia or a post treatment  effect.  Review of Systems:   General: no fevers, chills, no changes in body weight. HEENT: no blurry vision, hearing changes or sore throat Respiratory: no dyspnea, coughing, wheezing CV: no chest pain, no palpitations GI: no nausea, vomiting, abdominal pain, diarrhea, constipation GU: no dysuria, burning on urination, increased urinary frequency, hematuria  Ext: no leg edema. Has left arm swelling and redness. Neuro: no unilateral weakness, numbness, or tingling, no vision change or hearing loss. Has mild confusion. Skin: no rash, no skin tear. MSK: No muscle spasm, no deformity, no limitation of range of movement in spin Heme: No easy bruising.  Travel history: No recent long distant travel.  Allergy: No Known Allergies  Past Medical History:  Diagnosis Date  . Acute on chronic diastolic CHF (congestive heart failure) ([place])   . Acute pulmonary embolism ([place]) 12/13/2014  . Allergy   . Blood transfusion without reported diagnosis   . Breast cancer ([place]) 07/30/11 dx   Right  invasive ductal ca 7 0'clock,& left breast=invasive ductal ca  and dcis, left axilla nlymph node, metastatic ca  . Bronchitis    hx  . Cancer ([place]) 08-13-11   07-31-11-Dx. Bilarteral Breast cancer-left greater than rt.  . Cancer of central portion of left female breast ([place]) 08/01/2011  . GERD (gastroesophageal reflux disease)    doing well  . Hematuria, undiagnosed cause 08-13-11   Being evaluated by [facility] 08-14-11  . History of radiation therapy 02/20/12-04/15/12   left breast,total 60.4 Gy  . Hypertension   . Radiation-induced dermatitis 03/26/2012   Using radioplex cream, plus neosporin.   . S/P radiation therapy 10/09/14-10/27/14   whole brain 37.5Gy/85fx  .  Seasonal allergies   . Seroma 02/04/12   right breast  200cc removed  erythema on right side  . Wears partial dentures    wears upper and lower partial    Past Surgical History:  Procedure Laterality Date  . BREAST SURGERY    . CHEST  TUBE INSERTION Left 12/14/2014   Procedure: CHEST TUBE INSERTION;  Surgeon: Rexene Alberts, MD;  Location: [facility];  Service: Thoracic;  Laterality: Left;  . child birth  08-13-11   x3 -NVD  . MASTECTOMY W/ SENTINEL NODE BIOPSY  01/21/2012   Procedure: MASTECTOMY WITH SENTINEL LYMPH NODE BIOPSY;  Surgeon: Harl Bowie, MD;  Location: [facility];  Service: General;  Laterality: Bilateral;  Left modified radical mastectomy, Rt mastectomy with Sentinel lymphnode biospy  . PORT-A-CATH REMOVAL Right 12/22/2012   Procedure: REMOVAL PORT-A-CATH;  Surgeon: Harl Bowie, MD;  Location: [facility];  Service: General;  Laterality: Right;  . PORTACATH PLACEMENT  08/15/2011   Procedure: INSERTION PORT-A-CATH;  Surgeon: Harl Bowie, MD;  Location: [facility];  Service: General;  Laterality: N/A;  . SUBXYPHOID PERICARDIAL WINDOW N/A 12/14/2014   Procedure: SUBXYPHOID PERICARDIAL WINDOW;  Surgeon: Rexene Alberts, MD;  Location: [facility];  Service: Thoracic;  Laterality: N/A;  . TEE WITHOUT CARDIOVERSION N/A 12/14/2014   Procedure: TRANSESOPHAGEAL ECHOCARDIOGRAM (TEE);  Surgeon: Rexene Alberts, MD;  Location: [facility];  Service: Thoracic;  Laterality: N/A;    Social History:  reports that she has never smoked. She has never used smokeless tobacco. She reports that she drinks alcohol. She reports that she does not use drugs.  Family History:  Family History  Problem Relation Age of Onset  . Heart disease Mother   . Cancer Mother 56    breast, , 60 deceased  . Heart attack Father   . Cancer Sister 19    breast  . Colon cancer Neg Hx      Prior to Admission medications   Medication Sig Start Date End Date Taking? Authorizing Provider  anastrozole (ARIMIDEX) 1 MG tablet Take 1 tablet (1 mg total) by mouth daily. 08/29/16  Yes Chauncey Cruel, MD  b complex vitamins tablet Take 1 tablet by mouth daily.   Yes Historical Provider, MD  cholecalciferol (VITAMIN D) 1000 UNITS  tablet Take 1 tablet (1,000 Units total) by mouth daily. 11/08/14  Yes Chauncey Cruel, MD  docusate sodium (COLACE) 100 MG capsule Take 1 capsule (100 mg total) by mouth 2 (two) times daily. 07/10/15  Yes Chauncey Cruel, MD  furosemide (LASIX) 20 MG tablet Take 1 tablet (20 mg total) by mouth daily as needed. Patient taking differently: Take 20 mg by mouth daily as needed for fluid.  09/02/16  Yes Jolaine Artist, MD  magnesium oxide (MAG-OX) 400 (241.3 Mg) MG tablet Take 1 tablet (400 mg total) by mouth daily. 08/23/15  Yes Jolaine Artist, MD  Multiple Vitamins-Iron (MULTIVITAMINS WITH IRON) TABS Take 1 tablet by mouth daily.   Yes Historical Provider, MD  spironolactone (ALDACTONE) 25 MG tablet Take 0.5 tablets (12.5 mg total) by mouth daily. 09/02/16  Yes Jolaine Artist, MD    Physical Exam: Vitals:   09/18/16 0030 09/18/16 0130 09/18/16 0300 09/18/16 0308  BP: (!) 133/59 134/61 (!) 149/64   Pulse: 72 74 71   Resp: 16 15 12    Temp:    98.7 F (37.1 C)  SpO2: 92% 94% 96%   Weight:  Height:       General: Not in acute distress HEENT:       Eyes: PERRL, EOMI, no scleral icterus.       ENT: No discharge from the ears and nose, no pharynx injection, no tonsillar enlargement.        Neck: No JVD, no bruit, no mass felt. Heme: No neck lymph node enlargement. Cardiac: S1/S2, RRR, No murmurs, No gallops or rubs. Respiratory: No rales, wheezing, rhonchi or rubs. GI: Soft, nondistended, nontender, no rebound pain, no organomegaly, BS present. GU: No hematuria Ext: No pitting leg edema bilaterally. 2+DP/PT pulse bilaterally. Left arm is swelling and red from shoulder to forearm, no tenderness. Musculoskeletal: No joint deformities, No joint redness or warmth, no limitation of ROM in spin. Skin: No rashes.  Neuro: Alert, mildly confused, but oriented X3, cranial nerves II-XII grossly intact, moves all extremities normally. Muscle strength 5/5 in all extremities, sensation to  light touch intact. Brachial reflex 2+ bilaterally. Negative Babinski's sign.  Psych: Patient is not psychotic, no suicidal or hemocidal ideation.  Labs on Admission: I have personally reviewed following labs and imaging studies  CBC:  Recent Labs Lab 09/17/16 1842 09/18/16 0345  WBC 9.0 8.5  HGB 10.4* 10.0*  HCT 31.5* 30.8*  MCV 94.6 93.9  PLT 87* 80*   Basic Metabolic Panel:  Recent Labs Lab 09/17/16 1842 09/18/16 0345  NA 134* 138  K 3.4* 3.4*  CL 99* 104  CO2 26 27  GLUCOSE 164* 113*  BUN 18 15  CREATININE 0.72 0.60  CALCIUM 9.1 8.9   GFR: Estimated Creatinine Clearance: 80.5 mL/min (by C-G formula based on SCr of 0.6 mg/dL). Liver Function Tests: No results for input(s): AST, ALT, ALKPHOS, BILITOT, PROT, ALBUMIN in the last 168 hours. No results for input(s): LIPASE, AMYLASE in the last 168 hours. No results for input(s): AMMONIA in the last 168 hours. Coagulation Profile: No results for input(s): INR, PROTIME in the last 168 hours. Cardiac Enzymes:  Recent Labs Lab 09/17/16 1842  TROPONINI <0.03   BNP (last 3 results) No results for input(s): PROBNP in the last 8760 hours. HbA1C: No results for input(s): HGBA1C in the last 72 hours. CBG: No results for input(s): GLUCAP in the last 168 hours. Lipid Profile:  Recent Labs  09/18/16 0345  CHOL 146  HDL 32*  LDLCALC 103*  TRIG 56  CHOLHDL 4.6   Thyroid Function Tests: No results for input(s): TSH, T4TOTAL, FREET4, T3FREE, THYROIDAB in the last 72 hours. Anemia Panel: No results for input(s): VITAMINB12, FOLATE, FERRITIN, TIBC, IRON, RETICCTPCT in the last 72 hours. Urine analysis:    Component Value Date/Time   COLORURINE AMBER (A) 09/10/2014 1607   APPEARANCEUR TURBID (A) 09/10/2014 1607   LABSPEC 1.023 09/10/2014 1607   PHURINE 5.5 09/10/2014 1607   GLUCOSEU NEGATIVE 09/10/2014 1607   HGBUR LARGE (A) 09/10/2014 1607   BILIRUBINUR SMALL (A) 09/10/2014 1607   KETONESUR NEGATIVE 09/10/2014  1607   PROTEINUR 100 (A) 09/10/2014 1607   UROBILINOGEN 1.0 09/10/2014 1607   NITRITE NEGATIVE 09/10/2014 1607   LEUKOCYTESUR LARGE (A) 09/10/2014 1607   Sepsis Labs: @LABRCNTIP (procalcitonin:4,lacticidven:4) )No results found for this or any previous visit (from the past 240 hour(s)).   Radiological Exams on Admission: Dg Chest 2 View  Result Date: 09/17/2016 CLINICAL DATA:  Syncopal episode last night, BILATERAL breast cancer undergoing treatment, former smoker, hypertension EXAM: CHEST  2 VIEW COMPARISON:  C7 2017 FINDINGS: RIGHT jugular Port-A-Cath with tip projecting over SVC.  Minimal enlargement of cardiac silhouette. Atherosclerotic calcification aorta. Mediastinal contours and pulmonary vascularity normal. Lungs clear. No infiltrate, pleural effusion or pneumothorax. Prior BILATERAL mastectomy and LEFT axillary node dissection. Bones demineralized. IMPRESSION: No acute abnormalities. Mild enlargement cardiac silhouette. Aortic atherosclerosis. Electronically Signed   By: Lavonia Dana M.D.   On: 09/17/2016 19:37   Ct Head Wo Contrast  Addendum Date: 09/17/2016   ADDENDUM REPORT: 09/17/2016 22:44 ADDENDUM: Critical Value/emergent results were called by telephone at the time of interpretation on 09/17/2016 at 10:44 pm to Dr. Duffy Bruce , who verbally acknowledged these results. Electronically Signed   By: Ulyses Jarred M.D.   On: 09/17/2016 22:44   Result Date: 09/17/2016 CLINICAL DATA:  Dizziness and stage IV breast cancer EXAM: CT HEAD WITHOUT CONTRAST TECHNIQUE: Contiguous axial images were obtained from the base of the skull through the vertex without intravenous contrast. COMPARISON:  Brain MRI 05/19/2016 FINDINGS: Brain: There is focal hypoattenuation within the left cerebellar hemisphere. No intracranial hemorrhage. No midline shift or other mass effect. There is periventricular hypoattenuation compatible with chronic microvascular disease. Vascular: No hyperdense vessel or unexpected  calcification. Skull: Normal visualized skull base, calvarium and extracranial soft tissues. Sinuses/Orbits: The left mastoid air cells are opacified. The paranasal sinuses are clear. Normal orbits. IMPRESSION: 1. Focal hypoattenuation within the left cerebellum is concerning for acute ischemia, particularly in the context of reported dizziness. No associated hemorrhage or mass effect. MRI may be considered for confirmation. 2. Confluent white matter hypoattenuation may be secondary to chronic microvascular ischemia or a post treatment effect. Electronically Signed: By: Ulyses Jarred M.D. On: 09/17/2016 22:39   Ct Angio Chest Pe W Or Wo Contrast  Result Date: 09/17/2016 CLINICAL DATA:  Left arm erythema and warmth starting last evening with dizziness onset last night. Stage IV breast cancer. EXAM: CT ANGIOGRAPHY CHEST WITH CONTRAST TECHNIQUE: Multidetector CT imaging of the chest was performed using the standard protocol during bolus administration of intravenous contrast. Multiplanar CT image reconstructions and MIPs were obtained to evaluate the vascular anatomy. CONTRAST:  100 cc Isovue 370 IV COMPARISON:  CT 05/06/2016 FINDINGS: Cardiovascular: Web-like filling defects in the segmental and subsegmental arteries to the lower lobes, best seen series 5, image 59 within the right lower lobe and series 5, image 52 in the left lower lobe. RV/LV ratio = 1.2. The heart is borderline enlarged without pericardial effusion. 4 cm ascending aortic aneurysm without dissection. There is aortic atherosclerosis and coronary arteriosclerosis. Mediastinum/Nodes: Small hiatal hernia. No mediastinal lymphadenopathy. Lungs/Pleura: Dependent atelectasis both lungs with scarring in the left upper lobe possibly radiation induced. No dominant mass within the lungs identified. No pulmonary consolidation or pneumothorax. Upper Abdomen: No acute abnormality. Musculoskeletal: Port catheter projects over the anterior right chest wall with  tip in the right atrium. Multifocal osteoblastic metastasis along the dorsal spine. Bilateral mastectomy changes. Left axillary lymph node dissection. Review of the MIP images confirms the above findings. IMPRESSION: 1. Positive for acute PE with CT evidence of right heart strain (RV/LV Ratio = 1.2) consistent with at least submassive (intermediate risk) PE. The presence of right heart strain has been associated with an increased risk of morbidity and mortality. Please activate Code PE by paging [phone]. Tiny web-like nonocclusive filling defects are seen to both lower lobes. Critical Value/emergent results were called by telephone at the time of interpretation on 09/17/2016 at 10:49 pm to the ED secretary with message to be relayed to Dr. Duffy Bruce. Direct telephone line left in case of  any questions from this report. 2. Status post bilateral mastectomy with left upper lobe scarring and dependent atelectasis. 3. 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 Electronically Signed   By: Ashley Royalty M.D.   On: 09/17/2016 22:51     EKG: Independently reviewed.  Sinus rhythm, QTC 345, low voltage, nonspecific T-wave change.  Assessment/Plan Principal Problem:   PE (pulmonary thromboembolism) ([facility]) Active Problems:   Hypertension   GERD (gastroesophageal reflux disease)   Bilateral breast cancer ([facility])   Brain metastases (HCC)   Hypokalemia   Essential hypertension   Breast cancer metastasized to lung ([facility])   Chronic diastolic heart failure ([facility])   Thrombocytopenia ([place])   Fall   Acute encephalopathy   PE (pulmonary thromboembolism) ([place]): CT angiogram showed a submassive PE with evidence of R heart straining. EDP discussed with PCCM, Dr. Jimmy Footman, who recommended to get 2-D echo. Currently hemodynamically stable. BNP 127.6  -admit to stepdown for close monitoring -heparin drip initiated -2D echocardiogram ordered -LE dopplers ordered to  evaluate for DVT  Left arm swelling and redness: likely due to DVT. No tenderness, fever or leukocytosis, does not seem to be cellulitis. -UE dopplers ordered to evaluate for left arm for DVT  Chronic diastolic heart failure: 2-D echo on 09/02/16 showed EF 60-65% with grade 1 diastolic dysfunction. Patient does not have leg edema, BNP is slightly elevated, CHF seems to be compensated. -Hold diuretics, Lasix and spironolactone in the setting of acute submassive PE  HTN: -Continue lasix and spironolactone as above -IV hydralazine when necessary  GERD: -Protonix prn  Bilateral breast cancer metastasized to brain and Lung: s/p of bilateral mastectomy, radiation therapy, currently on chemotherapy, last dose was 3 weeks ago. Followed up by Dr. Jana Hakim -f/u with Dr. Jana Hakim  Hypokalemia: K= 3.4 on admission. - Repleted  Thrombocytopenia: platelet 87, likely due to chemotherapy. No bleeding tendency -Follow-up by CBC  Frequent fall and acute encephalopathy: Etiology is not clear. CT head showed focal hypoattenuation within the left cerebellum which is concerning for acute ischemia. Other differential diagnoses include metastasized disease. Neurology, Dr. Cheral Marker was consulted. -will get MRI of brain with and without contrast -f/u neurologist's recommendation   DVT ppx: On IV Heparin   Code Status: Full code (discussed with the patient, patient wants to be full code) Family Communication: Yes, patient's  son  at bed side Disposition Plan:  Anticipate discharge back to previous home environment Consults called:  Neuro, dr. Cheral Marker Admission status: SDU/inpation       Date of Service 09/18/2016    Ivor Costa [facility] Pager [phone]  If 7PM-7AM, please contact night-coverage [email or web address] Password [facility] 09/18/2016, 6:19 AM

## 2016-09-18 NOTE — Progress Notes (Signed)
71 year old female with past medical history of breast cancer status post bilateral mastectomy, chemoradiation and currently on chemotherapy, hypertension, GERD, PVD, diastolic CHF, pericardial effusion came to the ER with the complaints of left arm swelling and erythema. She also reported of some change in mental status therefore CT of the head was done which showed focal hypoattenuation within the left cerebellum concerning for acute ischemia CTA of the chest done showed pulmonary embolism with evidence of right heart strain showed concerns for submassive PE. She was started on heparin drip and ultrasound of the upper and lower extremities were ordered. Neurology was also consulted for possible stroke workup. MRI of the brain was recommended and it showed chronic white matter changes otherwise no acute or subacute infarction and also no evidence of metastatic disease. Currently we'll continue to monitor and get an echocardiogram as well. She will need to be on oral anticoagulation eventually. Order physical therapy. Will continue to follow along.  Plan of care discussed with the patient and the nurse. Please call with further questions of needed otherwise I'll see her again tomorrow.

## 2016-09-18 NOTE — Care Management Note (Signed)
Case Management Note  Patient Details  Name: Linda [name] MRN: [id number] Date of Birth: 1946-01-27  Subjective/Objective:                  From home with adult child and his family. /71 y.o. 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   Action/Plan: Admit status INPATIENT (PE (pulmonary thromboembolism)); anticipate discharge [facility].   Expected Discharge Date:   (unsure)               Expected Discharge Plan:  Home/Self Care  In-House Referral:  NA  Discharge planning Services  CM Consult  Post Acute Care Choice:    Choice offered to:     DME Arranged:    DME Agency:     HH Arranged:    HH Agency:     Status of Service:  In process, will continue to follow  If discussed at Long Length of Stay Meetings, dates discussed:    Additional Comments:  Fuller Mandril, RN 09/18/2016, 9:26 AM

## 2016-09-18 NOTE — Progress Notes (Signed)
STROKE TEAM PROGRESS NOTE   SUBJECTIVE (INTERVAL HISTORY) Her RN is at the bedside.  Dr. Leonie Man spoke with pt. She is still a little confused. Dr. Leonie Man called and spoke to her son over the phone related to HPI, and current dx. No acute stroke, TIA.   OBJECTIVE Temp:  [98.2 F (36.8 C)-98.7 F (37.1 C)] 98.7 F (37.1 C) (04/05 0308) Pulse Rate:  [71-88] 71 (04/05 0927) Resp:  [12-20] 18 (04/05 0927) BP: (122-149)/(54-80) 135/66 (04/05 0927) SpO2:  [91 %-99 %] 99 % (04/05 0927) Weight:  [93 kg (205 lb 0.4 oz)-95.5 kg (210 lb 7 oz)] 93 kg (205 lb 0.4 oz) (04/05 1022)  CBC:  Recent Labs Lab 09/17/16 1842 09/18/16 0345  WBC 9.0 8.5  HGB 10.4* 10.0*  HCT 31.5* 30.8*  MCV 94.6 93.9  PLT 87* 80*    Basic Metabolic Panel:  Recent Labs Lab 09/17/16 1842 09/18/16 0345  NA 134* 138  K 3.4* 3.4*  CL 99* 104  CO2 26 27  GLUCOSE 164* 113*  BUN 18 15  CREATININE 0.72 0.60  CALCIUM 9.1 8.9    HgbA1c: No results found for: HGBA1C   PHYSICAL EXAM Pleasant obese elderly caucasian lady not in distress. LUE has mild erythema and swelling.  . Afebrile. Head is nontraumatic. Neck is supple without bruit.    Cardiac exam no murmur or gallop. Lungs are clear to auscultation. Distal pulses are well felt. Neurological Exam ;  Awake  Alert oriented x 3. Normal speech and languageDiminished attention, registration and recall..eye movements full without nystagmus.fundi were not visualized. Vision acuity and fields appear normal. Hearing is normal. Palatal movements are normal. Face symmetric. Tongue midline. Normal strength, tone, reflexes and coordination. Normal sensation. Gait deferred.  ASSESSMENT/PLAN Linda [name] is a 71 y.o. female with history of metastatic breast cancer s/p B mastectomy w/ mets to spine on chemo, HTN, GERD, PCD, diastoil CHF, pericardial effusion who presented with LUE swelling after a fall, ? Confusion No dizziness per son. She did not receive IV t-PA.    PE  L> R metastatic breast cancer to brain and lung No stroke, no TIA  Resultant  encephalopathy  CT head L cerebellar focal hypoattenuation. No hmg.   MRI  No acute infarct. No metastatic disease. small vessel disease.L>R mastoid effusions, chronic  UE doppler LUE neg DVT  LE doppler B negative   2D Echo  pending   LDL 103  HgbA1c pending  IV heparin for VTE prophylaxis  Diet NPO time specified Except for: Sips with Meds, Ice Chips  No antithrombotic prior to admission, now on heparin IV  Therapy recommendations:  pending   Disposition:  pending   No indication for further stroke workup/testing/monitoring  Hypertension  Stable   Other Stroke Risk Factors  Advanced age  ETOH use  UDS not performed  Obesity, Body mass index is 30.28 kg/m.  Acute on chronic diastolic CHF  Other Active Problems  Acute encephalopathy likely d/t acute PE. Given pt's inability to remember, check EEG  GERD  Hypokalemia  Thrombocytopenia  Frequent falls  No Stroke. No TIA. No need for additional stroke workup. Stroke team will sign off. Call for questions.  Hospital day # 0  Radene Journey [facility] for Pager information 09/18/2016 11:00 AM  I have personally examined this patient, reviewed notes, independently viewed imaging studies, participated in medical decision making and plan of care.ROS completed by me personally and pertinent positives fully documented  I have made any additions or clarifications directly to the above note. Agree with note above. The history as per the patient and come performed after my telephonic discussion with the son appears not to suggest a TIA or stroke. She returned home from work and was disoriented and had generalized weakness and unable to walk. Upon arrival she was found to have pulmonary embolism and has improved after starting on anticoagulation. Brain MRI shows no evidence of acute stroke. Recommend check  EEG for possible seizures. Discussed with patient's son. Greater than 50% time during this 35 minute visit was spent on counseling and coordination of care about her presentation, evaluation and treatment plan discussion and answering questions.  Antony Contras, MD Medical Director [place] Pager: [phone] 09/18/2016 2:23 PM  To contact Stroke Continuity provider, please refer to [email or web address] After hours, contact General Neurology

## 2016-09-18 NOTE — Consult Note (Signed)
NEURO HOSPITALIST CONSULT NOTE   Requestig physician: Dr. Blaine Hamper  Reason for Consult: Gait unsteadiness and confusion  History obtained from:   Patient and Chart     HPI:                                                                                                                                          Linda [name] is an 71 y.o. female who presented to the ED with new onset of erythema, swelling and warmth involving her LUE, first noted when she awoke from sleep. She has a history of metastatic breast cancer, s/p bilateral mastectomy.  When interviewed in the ED, her son also stated that she had been dizzy/lightheaded as well as disoriented and with difficulty walking when she came home from work on Tuesday night. She also had been complaining of left arm and leg numbness. She had a similar episode of inability to ambulate in December of 2017 that resolved spontaneously.   A CT head was obtained in the ED, revealing focal hypoattenuation within the left cerebellum, concerning for acute ischemia, particularly in the context of reported dizziness. Also noted was confluent white matter hypoattenuation, which may be secondary to chronic microvascular ischemia or a post treatment effect.  CT chest revealed an acute PE with CT evidence of right heart strain (RV/LV Ratio = 1.2) consistent with at least submassive (intermediate risk) PE. Also noted was osteoblastic metastatic disease to the dorsal spine without significant appearing change  Her PMHx includes HTN, HLD, PE, pericardial tamponade and CHF. The patient has a power port and receives chemotherapy every 4 weeks for her breast cancer; her next treatment is scheduled for early April.   Past Medical History:  Diagnosis Date  . Acute on chronic diastolic CHF (congestive heart failure) ([place])   . Acute pulmonary embolism ([place]) 12/13/2014  . Allergy   . Blood transfusion without reported diagnosis   . Breast cancer  ([place]) 07/30/11 dx   Right  invasive ductal ca 7 0'clock,& left breast=invasive ductal ca  and dcis, left axilla nlymph node, metastatic ca  . Bronchitis    hx  . Cancer ([place]) 08-13-11   07-31-11-Dx. Bilarteral Breast cancer-left greater than rt.  . Cancer of central portion of left female breast ([place]) 08/01/2011  . GERD (gastroesophageal reflux disease)    doing well  . Hematuria, undiagnosed cause 08-13-11   Being evaluated by [facility] 08-14-11  . History of radiation therapy 02/20/12-04/15/12   left breast,total 60.4 Gy  . Hypertension   . Radiation-induced dermatitis 03/26/2012   Using radioplex cream, plus neosporin.   . S/P radiation therapy 10/09/14-10/27/14   whole brain 37.5Gy/39fx  . Seasonal allergies   . Seroma 02/04/12   right breast  200cc removed  erythema on  right side  . Wears partial dentures    wears upper and lower partial    Past Surgical History:  Procedure Laterality Date  . BREAST SURGERY    . CHEST TUBE INSERTION Left 12/14/2014   Procedure: CHEST TUBE INSERTION;  Surgeon: Rexene Alberts, MD;  Location: [place];  Service: Thoracic;  Laterality: Left;  . child birth  08-13-11   x3 -NVD  . MASTECTOMY W/ SENTINEL NODE BIOPSY  01/21/2012   Procedure: MASTECTOMY WITH SENTINEL LYMPH NODE BIOPSY;  Surgeon: Harl Bowie, MD;  Location: [place];  Service: General;  Laterality: Bilateral;  Left modified radical mastectomy, Rt mastectomy with Sentinel lymphnode biospy  . PORT-A-CATH REMOVAL Right 12/22/2012   Procedure: REMOVAL PORT-A-CATH;  Surgeon: Harl Bowie, MD;  Location: [place];  Service: General;  Laterality: Right;  . PORTACATH PLACEMENT  08/15/2011   Procedure: INSERTION PORT-A-CATH;  Surgeon: Harl Bowie, MD;  Location: [facility];  Service: General;  Laterality: N/A;  . SUBXYPHOID PERICARDIAL WINDOW N/A 12/14/2014   Procedure: SUBXYPHOID PERICARDIAL WINDOW;  Surgeon: Rexene Alberts, MD;  Location: [place];   Service: Thoracic;  Laterality: N/A;  . TEE WITHOUT CARDIOVERSION N/A 12/14/2014   Procedure: TRANSESOPHAGEAL ECHOCARDIOGRAM (TEE);  Surgeon: Rexene Alberts, MD;  Location: [facility];  Service: Thoracic;  Laterality: N/A;    Family History  Problem Relation Age of Onset  . Heart disease Mother   . Cancer Mother 18    breast, , 69 deceased  . Heart attack Father   . Cancer Sister 98    breast  . Colon cancer Neg Hx     Social History:  reports that she has never smoked. She has never used smokeless tobacco. She reports that she drinks alcohol. She reports that she does not use drugs.  No Known Allergies  MEDICATIONS:                                                                                                                     Prior to Admission:  (Not in a hospital admission) Scheduled: . anastrozole  1 mg Oral Daily  . B-complex with vitamin C  1 tablet Oral Daily  . cholecalciferol  1,000 Units Oral Daily  . docusate sodium  100 mg Oral BID  . magnesium oxide  400 mg Oral Daily  . multivitamins with iron  1 tablet Oral Daily  . sodium chloride flush  3 mL Intravenous Q12H    ROS:  History obtained from patient. Denies headache, chest pain, SOB or limb pain, clarifying that her LUE is not painful. Also denies trouble speaking, trouble swallowing, vision change or limb weakness.   Blood pressure (!) 149/64, pulse 71, temperature 98.7 F (37.1 C), resp. rate 12, height 5\' 9"  (1.753 m), weight 95.5 kg (210 lb 7 oz), SpO2 96 %.  General Examination:                                                                                                      HEENT-  [place]/AT  Extremities- Left upper extremity erythema, warmth and swelling  Neurological Examination Mental Status: Mildly decreased level of alertness. Pleasant and cooperative. Oriented to  place, time and situation. Mild confusion with impaired recent memory. Subtle dysarthria. Speech fluent with intact comprehension. Naming intact for thumb and pinky but not forefinger. Somewhat slow when attempting to follow a complex, directional 2-step command.   Cranial Nerves: II: Visual fields intact, PERRL III,IV, VI: ptosis not present, EOMI without nystagmus V,VII: smile symmetric, facial temp sensation normal bilaterally VIII: hearing intact to conversation IX,X: palate rises symmetrically XI: Symmetric XII: midline tongue extension Motor: Right : Upper extremity   5/5    Left:     Upper extremity   5/5  Lower extremity   5/5     Lower extremity   5/5 Normal tone throughout; no atrophy noted Sensory: Temp and light touch intact throughout. No extinction.  Deep Tendon Reflexes: Hypoactive upper and lower extremity reflexes.  Plantars: Right: downgoing   Left: downgoing Cerebellar: No ataxia with FNF bilaterally.  Gait: Deferred   Lab Results: Basic Metabolic Panel:  Recent Labs Lab 09/17/16 1842  NA 134*  K 3.4*  CL 99*  CO2 26  GLUCOSE 164*  BUN 18  CREATININE 0.72  CALCIUM 9.1    Liver Function Tests: No results for input(s): AST, ALT, ALKPHOS, BILITOT, PROT, ALBUMIN in the last 168 hours. No results for input(s): LIPASE, AMYLASE in the last 168 hours. No results for input(s): AMMONIA in the last 168 hours.  CBC:  Recent Labs Lab 09/17/16 1842 09/18/16 0345  WBC 9.0 8.5  HGB 10.4* 10.0*  HCT 31.5* 30.8*  MCV 94.6 93.9  PLT 87* 80*    Cardiac Enzymes:  Recent Labs Lab 09/17/16 1842  TROPONINI <0.03    Lipid Panel: No results for input(s): CHOL, TRIG, HDL, CHOLHDL, VLDL, LDLCALC in the last 168 hours.  CBG: No results for input(s): GLUCAP in the last 168 hours.  Microbiology: Results for orders placed or performed during the hospital encounter of 12/13/14  MRSA PCR Screening     Status: None   Collection Time: 12/13/14  4:20 PM  Result  Value Ref Range Status   MRSA by PCR NEGATIVE NEGATIVE Final    Comment:        The GeneXpert MRSA Assay (FDA approved for NASAL specimens only), is one component of a comprehensive MRSA colonization surveillance program. It is not intended to diagnose MRSA infection nor to guide or monitor treatment for MRSA infections.  Culture, body fluid-bottle     Status: None   Collection Time: 12/14/14  8:01 AM  Result Value Ref Range Status   Specimen Description FLUID PERICARDIAL  Final   Special Requests   Final    BOTTLES DRAWN AEROBIC AND ANAEROBIC 10CC PATIENT ON FOLLOWING ZINACEF   Culture NO GROWTH 5 DAYS  Final   Report Status 12/19/2014 FINAL  Final  Gram stain     Status: None   Collection Time: 12/14/14  8:01 AM  Result Value Ref Range Status   Specimen Description FLUID PERICARDIAL  Final   Special Requests PATIENT ON FOLLOWING ZINACEF  Final   Gram Stain   Final    MODERATE WBC PRESENT, PREDOMINANTLY MONONUCLEAR NO ORGANISMS SEEN    Report Status 12/14/2014 FINAL  Final  Culture, body fluid-bottle     Status: None   Collection Time: 12/14/14  8:05 AM  Result Value Ref Range Status   Specimen Description FLUID LEFT PLEURAL  Final   Special Requests PATIENT ON FOLLOWING ZINACEF  Final   Culture NO GROWTH 5 DAYS  Final   Report Status 12/19/2014 FINAL  Final  Gram stain     Status: None   Collection Time: 12/14/14  8:05 AM  Result Value Ref Range Status   Specimen Description FLUID LEFT PLEURAL  Final   Special Requests PATIENT ON FOLLOWING ZINACEF  Final   Gram Stain   Final    MODERATE WBC PRESENT,BOTH PMN AND MONONUCLEAR NO ORGANISMS SEEN    Report Status 12/14/2014 FINAL  Final    Coagulation Studies: No results for input(s): LABPROT, INR in the last 72 hours.  Imaging: Dg Chest 2 View  Result Date: 09/17/2016 CLINICAL DATA:  Syncopal episode last night, BILATERAL breast cancer undergoing treatment, former smoker, hypertension EXAM: CHEST  2 VIEW  COMPARISON:  C7 2017 FINDINGS: RIGHT jugular Port-A-Cath with tip projecting over SVC. Minimal enlargement of cardiac silhouette. Atherosclerotic calcification aorta. Mediastinal contours and pulmonary vascularity normal. Lungs clear. No infiltrate, pleural effusion or pneumothorax. Prior BILATERAL mastectomy and LEFT axillary node dissection. Bones demineralized. IMPRESSION: No acute abnormalities. Mild enlargement cardiac silhouette. Aortic atherosclerosis. Electronically Signed   By: Lavonia Dana M.D.   On: 09/17/2016 19:37   Ct Head Wo Contrast  Addendum Date: 09/17/2016   ADDENDUM REPORT: 09/17/2016 22:44 ADDENDUM: Critical Value/emergent results were called by telephone at the time of interpretation on 09/17/2016 at 10:44 pm to Dr. Duffy Bruce , who verbally acknowledged these results. Electronically Signed   By: Ulyses Jarred M.D.   On: 09/17/2016 22:44   Result Date: 09/17/2016 CLINICAL DATA:  Dizziness and stage IV breast cancer EXAM: CT HEAD WITHOUT CONTRAST TECHNIQUE: Contiguous axial images were obtained from the base of the skull through the vertex without intravenous contrast. COMPARISON:  Brain MRI 05/19/2016 FINDINGS: Brain: There is focal hypoattenuation within the left cerebellar hemisphere. No intracranial hemorrhage. No midline shift or other mass effect. There is periventricular hypoattenuation compatible with chronic microvascular disease. Vascular: No hyperdense vessel or unexpected calcification. Skull: Normal visualized skull base, calvarium and extracranial soft tissues. Sinuses/Orbits: The left mastoid air cells are opacified. The paranasal sinuses are clear. Normal orbits. IMPRESSION: 1. Focal hypoattenuation within the left cerebellum is concerning for acute ischemia, particularly in the context of reported dizziness. No associated hemorrhage or mass effect. MRI may be considered for confirmation. 2. Confluent white matter hypoattenuation may be secondary to chronic microvascular  ischemia or a post treatment effect. Electronically Signed: By: Ulyses Jarred  M.D. On: 09/17/2016 22:39   Ct Angio Chest Pe W Or Wo Contrast  Result Date: 09/17/2016 CLINICAL DATA:  Left arm erythema and warmth starting last evening with dizziness onset last night. Stage IV breast cancer. EXAM: CT ANGIOGRAPHY CHEST WITH CONTRAST TECHNIQUE: Multidetector CT imaging of the chest was performed using the standard protocol during bolus administration of intravenous contrast. Multiplanar CT image reconstructions and MIPs were obtained to evaluate the vascular anatomy. CONTRAST:  100 cc Isovue 370 IV COMPARISON:  CT 05/06/2016 FINDINGS: Cardiovascular: Web-like filling defects in the segmental and subsegmental arteries to the lower lobes, best seen series 5, image 59 within the right lower lobe and series 5, image 52 in the left lower lobe. RV/LV ratio = 1.2. The heart is borderline enlarged without pericardial effusion. 4 cm ascending aortic aneurysm without dissection. There is aortic atherosclerosis and coronary arteriosclerosis. Mediastinum/Nodes: Small hiatal hernia. No mediastinal lymphadenopathy. Lungs/Pleura: Dependent atelectasis both lungs with scarring in the left upper lobe possibly radiation induced. No dominant mass within the lungs identified. No pulmonary consolidation or pneumothorax. Upper Abdomen: No acute abnormality. Musculoskeletal: Port catheter projects over the anterior right chest wall with tip in the right atrium. Multifocal osteoblastic metastasis along the dorsal spine. Bilateral mastectomy changes. Left axillary lymph node dissection. Review of the MIP images confirms the above findings. IMPRESSION: 1. Positive for acute PE with CT evidence of right heart strain (RV/LV Ratio = 1.2) consistent with at least submassive (intermediate risk) PE. The presence of right heart strain has been associated with an increased risk of morbidity and mortality. Please activate Code PE by paging  [phone]. Tiny web-like nonocclusive filling defects are seen to both lower lobes. Critical Value/emergent results were called by telephone at the time of interpretation on 09/17/2016 at 10:49 pm to the ED secretary with message to be relayed to Dr. Duffy Bruce. Direct telephone line left in case of any questions from this report. 2. Status post bilateral mastectomy with left upper lobe scarring and dependent atelectasis. 3. 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 Electronically Signed   By: Ashley Royalty M.D.   On: 09/17/2016 22:51    Assessment: New onset of gait unsteadiness and confusion 1. CT head with faint left cerebellar hypointensity consistent with artifact versus subacute ischemic stroke. Also on DDx is cerebellar metastasis.  2. Exam reveals no ataxia or lateralized weakness. Mildly impaired cognition is noted.  3. Acute PE with CT evidence of right heart strain. Currently on heparin drip.   4. 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 Thrombocytopenia.   Recommendations: 1. MRI of brain with and without contrast. If positive for acute stroke, obtain MRA of head and neck, as well as TTE.  2. MRI of cervical, thoracic and lumbar spine with and without contrast to rule out spinal cord or cauda equina compression.  3. On heparin drip. 4. PT/OT/Speech  Electronically signed: Dr. Kerney Elbe 09/18/2016, 4:17 AM

## 2016-09-18 NOTE — ED Notes (Signed)
Pt up to the br with assistance  She refuses to wear the yellow socks  She has been offered several times  shakey on her feet.  She is still c/o coccyx pain.  Lt arm remains red and swollen  Good radial pulse

## 2016-09-18 NOTE — ED Notes (Signed)
Pt. In MRI still.

## 2016-09-18 NOTE — ED Notes (Signed)
Pt. Transported to MRI at this time.  

## 2016-09-18 NOTE — Procedures (Signed)
Electroencephalogram (EEG) Report  Date of study: 09/18/16  Requesting clinician: Burnetta Sabin, NP  Reason for study: Evaluate for seizure  Brief clinical history: This is a 71 year old woman presented with encephalopathy. EEG is requested for further evaluation.  Medications:  Current Facility-Administered Medications:  .  acetaminophen (TYLENOL) tablet 650 mg, 650 mg, Oral, Q6H PRN **OR** acetaminophen (TYLENOL) suppository 650 mg, 650 mg, Rectal, Q6H PRN, Ivor Costa, MD .  albuterol (PROVENTIL) (2.5 MG/3ML) 0.083% nebulizer solution 2.5 mg, 2.5 mg, Nebulization, Q4H PRN, Ivor Costa, MD .  anastrozole (ARIMIDEX) tablet 1 mg, 1 mg, Oral, Daily, Ivor Costa, MD .  B-complex with vitamin C tablet 1 tablet, 1 tablet, Oral, Daily, Ivor Costa, MD .  cephALEXin (KEFLEX) capsule 500 mg, 500 mg, Oral, Q8H, Ankit Chirag Amin, MD, 500 mg at 09/18/16 1722 .  cholecalciferol (VITAMIN D) tablet 1,000 Units, 1,000 Units, Oral, Daily, Ivor Costa, MD, 1,000 Units at 09/18/16 1257 .  docusate sodium (COLACE) capsule 100 mg, 100 mg, Oral, BID, Ivor Costa, MD .  heparin ADULT infusion 100 units/mL (25000 units/263mL sodium chloride 0.45%), 900 Units/hr, Intravenous, Continuous, Jessica C Carney, RPH, Last Rate: 9 mL/hr at 09/18/16 1345, 900 Units/hr at 09/18/16 1345 .  hydrALAZINE (APRESOLINE) injection 5 mg, 5 mg, Intravenous, Q2H PRN, Ivor Costa, MD .  magnesium oxide (MAG-OX) tablet 400 mg, 400 mg, Oral, Daily, Ivor Costa, MD, 400 mg at 09/18/16 1257 .  multivitamins with iron tablet 1 tablet, 1 tablet, Oral, Daily, Ivor Costa, MD .  ondansetron [facility]) tablet 4 mg, 4 mg, Oral, Q6H PRN **OR** ondansetron (ZOFRAN) injection 4 mg, 4 mg, Intravenous, Q6H PRN, Ivor Costa, MD .  pantoprazole (PROTONIX) EC tablet 40 mg, 40 mg, Oral, Daily PRN, Ivor Costa, MD .  sodium chloride flush (NS) 0.9 % injection 10-40 mL, 10-40 mL, Intracatheter, PRN, Duffy Bruce, MD .  sodium chloride flush (NS) 0.9 % injection 3 mL, 3 mL,  Intravenous, Q12H, Ivor Costa, MD, 3 mL at 09/18/16 0325 .  zolpidem (AMBIEN) tablet 5 mg, 5 mg, Oral, QHS PRN, Ivor Costa, MD  Description: This is a routine EEG performed using standard international 10-20 electrode placement. A total of 18 channels are recorded, including one for the EKG. the patient is awake and drowsy during the recording.   Activating Maneuvers: None  Findings:  The EKG channel demonstrates a regular rhythm with a rate of 80 beats per minute.   The background consists of well-formed alpha activity. The best dominant posterior rhythm is 10-11 hertz. This is symmetric and reacts as expected with eye opening.   There are no focal asymmetries. No epileptiform discharges are present. No seizures are recorded.   Drowsiness is recorded and is normal in appearance.   Impression:  This is a normal awake and drowsy EEG.  Clinical correlation: While it does not demonstrate any active epileptic focus to suggest high propensity for seizure, this normal study does not exclude this diagnosis. Clinical correlation is required.   Melba Coon, MD [facility]

## 2016-09-18 NOTE — ED Notes (Signed)
Admitting doctor at the bedside 

## 2016-09-18 NOTE — Progress Notes (Signed)
Preliminary results by tech - Venous Duplex Lower Ext. Completed. Negative for deep and superficial vein thrombosis in both legs.  Juvencio Verdi, BS, RDMS, RVT  

## 2016-09-18 NOTE — Progress Notes (Signed)
Preliminary results by tech - Left Upper Ext Venous Completed. Negative for deep and superficial vein thrombosis. Sherika Kubicki, BS, RDMS, RVT  

## 2016-09-18 NOTE — Progress Notes (Signed)
ANTICOAGULATION CONSULT NOTE - Initial Consult  Pharmacy Consult for Heparin Indication: pulmonary embolus  No Known Allergies  Patient Measurements: Height: 5\' 9"  (175.3 cm) Weight: 205 lb 0.4 oz (93 kg) IBW/kg (Calculated) : 66.2 Heparin Dosing Weight: 86 kg  Vital Signs: Temp: 98.4 F (36.9 C) (04/05 1100) Temp Source: Oral (04/05 1100) BP: 135/58 (04/05 1100) Pulse Rate: 74 (04/05 1100)  Labs:  Recent Labs  09/17/16 1842 09/18/16 0345 09/18/16 1146  HGB 10.4* 10.0*  --   HCT 31.5* 30.8*  --   PLT 87* 80*  --   HEPARINUNFRC  --   --  1.00*  CREATININE 0.72 0.60  --   TROPONINI <0.03  --   --     Estimated Creatinine Clearance: 79.4 mL/min (by C-G formula based on SCr of 0.6 mg/dL).   Medical History: Past Medical History:  Diagnosis Date  . Acute on chronic diastolic CHF (congestive heart failure) ([place])   . Acute pulmonary embolism ([place]) 12/13/2014  . Allergy   . Blood transfusion without reported diagnosis   . Breast cancer ([place]) 07/30/11 dx   Right  invasive ductal ca 7 0'clock,& left breast=invasive ductal ca  and dcis, left axilla nlymph node, metastatic ca  . Bronchitis    hx  . Cancer ([place]) 08-13-11   07-31-11-Dx. Bilarteral Breast cancer-left greater than rt.  . Cancer of central portion of left female breast ([place]) 08/01/2011  . Diabetes mellitus without complication ([place])   . GERD (gastroesophageal reflux disease)    doing well  . Hematuria, undiagnosed cause 08-13-11   Being evaluated by [facility] 08-14-11  . History of radiation therapy 02/20/12-04/15/12   left breast,total 60.4 Gy  . Hypertension   . Radiation-induced dermatitis 03/26/2012   Using radioplex cream, plus neosporin.   . S/P radiation therapy 10/09/14-10/27/14   whole brain 37.5Gy/75fx  . Seasonal allergies   . Seroma 02/04/12   right breast  200cc removed  erythema on right side  . Wears partial dentures    wears upper and lower partial    Medications:  See electronic med  rec  Assessment: 71 y.o. F with metastatic breast cancer presents with LUE warmth/erythema. Also with LOC PTA. Chest CT positive for acute PE with CT evidence of right heart strain (RV/LV Ratio = 1.2) consistent with at least submassive (intermediate risk) PE. Noted pt with h/o PE (11/2014) but never anticoagulated for this. Hgb and platelet count low but stable at baseline.   Initial heparin level supratherapeutic at 1.  Confirmed with RN, lab drawn from pt arm, heparin running through portacath.  No bleeding or complications noted.  Goal of Therapy:  Heparin level 0.3-0.7 units/ml Monitor platelets by anticoagulation protocol: Yes   Plan:  Hold heparin x  1 hr. Then resume heparin at lower rate of 900 units/hr. Will f/u heparin level in 8 hours Daily heparin level and CBC  Uvaldo Rising, BCPS  Clinical Pharmacist Pager [phone]  09/18/2016 1:04 PM

## 2016-09-18 NOTE — ED Notes (Signed)
Patient transported to MRI 

## 2016-09-18 NOTE — Progress Notes (Signed)
ANTICOAGULATION CONSULT NOTE - Initial Consult  Pharmacy Consult for heparin Indication: pulmonary embolus  Labs:  Recent Labs  09/17/16 1842 09/18/16 0345 09/18/16 1146 09/18/16 2211  HGB 10.4* 10.0*  --   --   HCT 31.5* 30.8*  --   --   PLT 87* 80*  --   --   HEPARINUNFRC  --   --  1.00* 0.60  CREATININE 0.72 0.60  --   --   TROPONINI <0.03  --   --   --     Assessment/Plan:  71yo female therapeutic on heparin after rate change. Will continue gtt at current rate and confirm stable with am labs.   Wynona Neat, PharmD, BCPS  09/18/2016,11:51 PM

## 2016-09-18 NOTE — Progress Notes (Signed)
EEG Completed; Results Pending  

## 2016-09-18 NOTE — Telephone Encounter (Signed)
Currently admitted.

## 2016-09-18 NOTE — ED Notes (Signed)
Pt moved to a rgular hospital bed  And to rm 5 on pod a  Her son is going home now.

## 2016-09-18 NOTE — ED Notes (Addendum)
Pt O2 saturation dropped to 89-90% on RA. Pt placed on 2L [place] per admitting provider. O2 increased to 96%.

## 2016-09-19 ENCOUNTER — Other Ambulatory Visit (HOSPITAL_COMMUNITY): Payer: Self-pay

## 2016-09-19 ENCOUNTER — Telehealth: Payer: Self-pay | Admitting: Oncology

## 2016-09-19 DIAGNOSIS — C7951 Secondary malignant neoplasm of bone: Secondary | ICD-10-CM

## 2016-09-19 DIAGNOSIS — J910 Malignant pleural effusion: Secondary | ICD-10-CM

## 2016-09-19 DIAGNOSIS — C50912 Malignant neoplasm of unspecified site of left female breast: Secondary | ICD-10-CM

## 2016-09-19 DIAGNOSIS — C7931 Secondary malignant neoplasm of brain: Secondary | ICD-10-CM

## 2016-09-19 DIAGNOSIS — Z79811 Long term (current) use of aromatase inhibitors: Secondary | ICD-10-CM

## 2016-09-19 DIAGNOSIS — I2699 Other pulmonary embolism without acute cor pulmonale: Principal | ICD-10-CM

## 2016-09-19 DIAGNOSIS — C50911 Malignant neoplasm of unspecified site of right female breast: Secondary | ICD-10-CM

## 2016-09-19 DIAGNOSIS — L03114 Cellulitis of left upper limb: Secondary | ICD-10-CM

## 2016-09-19 DIAGNOSIS — Z170 Estrogen receptor positive status [ER+]: Secondary | ICD-10-CM

## 2016-09-19 LAB — CBC
HCT: 27.3 % — ABNORMAL LOW (ref 36.0–46.0)
HEMOGLOBIN: 9.2 g/dL — AB (ref 12.0–15.0)
MCH: 31.6 pg (ref 26.0–34.0)
MCHC: 33.7 g/dL (ref 30.0–36.0)
MCV: 93.8 fL (ref 78.0–100.0)
Platelets: 94 10*3/uL — ABNORMAL LOW (ref 150–400)
RBC: 2.91 MIL/uL — AB (ref 3.87–5.11)
RDW: 17.5 % — ABNORMAL HIGH (ref 11.5–15.5)
WBC: 8.3 10*3/uL (ref 4.0–10.5)

## 2016-09-19 LAB — HEPARIN LEVEL (UNFRACTIONATED): Heparin Unfractionated: 0.58 IU/mL (ref 0.30–0.70)

## 2016-09-19 LAB — GLUCOSE, CAPILLARY: Glucose-Capillary: 85 mg/dL (ref 65–99)

## 2016-09-19 LAB — HEMOGLOBIN A1C
HEMOGLOBIN A1C: 5.4 % (ref 4.8–5.6)
MEAN PLASMA GLUCOSE: 108 mg/dL

## 2016-09-19 MED ORDER — ENOXAPARIN SODIUM 100 MG/ML ~~LOC~~ SOLN
1.0000 mg/kg | Freq: Two times a day (BID) | SUBCUTANEOUS | Status: DC
  Administered 2016-09-19 – 2016-09-21 (×6): 95 mg via SUBCUTANEOUS
  Filled 2016-09-19: qty 1
  Filled 2016-09-19: qty 0.95
  Filled 2016-09-19 (×4): qty 1

## 2016-09-19 NOTE — Progress Notes (Signed)
SEREEN SCHAFF   DOB:[birth year]   NL#:[id number]   XTK#:[id number]  Subjective:  [name] tells me she fell on Easter day, getting back into her car after church, and injured her right elbow and lower back. 3 days later, on April 4, she noted her left upper extremity to be bright red. She had had an episode of shortness of breath, dizziness, and confusion the evening before. In the emergency room a CT and you was obtained which showed bilateral segmental and subsegmental pulmonary emboli, with some right heart strain. Her osteoblastic metastases were incidentally noted but there were no visceral metastases. A noncontrast head CT showed what might be left cerebellar ischemia, but brain MRI with and without contrast 09/18/2016 showed no evidence of stroke and incidentally no metastatic disease to the brain. There were bilateral mastoid effusions left greater than right. These were felt to be chronic.  On 09/18/2016 she had bilateral lower extremity venous Dopplers and left upper extremity venous Dopplers all negative for clot  She was started on Keflex with prompt resolution of her left upper extremity cellulitis. She was anticoagulated with intravenous unfractionated heparin and transitioned to Lovenox at 1 mg every 12 hours, which will be her discharge plan as well. She does not anticipate any problem receiving the medications which she will either give herself or have her family administer. She prefers to small shots to 1 larger shot she says.  Today she still feels a little bit weak and dizzy and a little short of breath. Aside from that a detailed review of systems today was stable.  Objective: [name] examined in bed Vitals:   09/19/16 0900 09/19/16 1000  BP: 91/70 (!) 110/52  Pulse: 71 83  Resp: 15 16  Temp:      Body mass index is 30.28 kg/m.  Intake/Output Summary (Last 24 hours) at 09/19/16 1309 Last data filed at 09/19/16 1048  Gross per 24 hour  Intake            529.2 ml   Output              550 ml  Net            -20.8 ml     Sclerae unicteric  No cervical or supraclavicular adenopathy  Lungs no rales or wheezes--auscultated anterolaterally  Heart regular rate and rhythm  Abdomen soft, +BS  Neuro nonfocal  Breast exam: Deferred  CBG (last 3)   Recent Labs  09/18/16 1121 09/18/16 1612 09/19/16 0727  GLUCAP 105* 125* 85     Labs:  Lab Results  Component Value Date   WBC 8.3 09/19/2016   HGB 9.2 (L) 09/19/2016   HCT 27.3 (L) 09/19/2016   MCV 93.8 09/19/2016   PLT 94 (L) 09/19/2016   NEUTROABS 2.4 09/10/2016    _0 @  Urine Studies No results for input(s): UHGB, CRYS in the last 72 hours.  Invalid input(s): UACOL, UAPR, USPG, UPH, UTP, UGL, UKET, UBIL, UNIT, UROB, ULEU, UEPI, UWBC, URBC, UBAC, CAST, UCOM, BILUA  Basic Metabolic Panel:  Recent Labs Lab 09/17/16 1842 09/18/16 0345  NA 134* 138  K 3.4* 3.4*  CL 99* 104  CO2 26 27  GLUCOSE 164* 113*  BUN 18 15  CREATININE 0.72 0.60  CALCIUM 9.1 8.9   GFR Estimated Creatinine Clearance: 79.4 mL/min (by C-G formula based on SCr of 0.6 mg/dL). Liver Function Tests: No results for input(s): AST, ALT, ALKPHOS, BILITOT, PROT, ALBUMIN in the last 168 hours. No results  for input(s): LIPASE, AMYLASE in the last 168 hours. No results for input(s): AMMONIA in the last 168 hours. Coagulation profile No results for input(s): INR, PROTIME in the last 168 hours.  CBC:  Recent Labs Lab 09/17/16 1842 09/18/16 0345 09/19/16 0229  WBC 9.0 8.5 8.3  HGB 10.4* 10.0* 9.2*  HCT 31.5* 30.8* 27.3*  MCV 94.6 93.9 93.8  PLT 87* 80* 94*   Cardiac Enzymes:  Recent Labs Lab 09/17/16 1842  TROPONINI <0.03   BNP: Invalid input(s): POCBNP CBG:  Recent Labs Lab 09/18/16 1121 09/18/16 1612 09/19/16 0727  GLUCAP 105* 125* 85   D-Dimer No results for input(s): DDIMER in the last 72 hours. Hgb A1c  Recent Labs  09/18/16 0345  HGBA1C 5.4   Lipid Profile  Recent  Labs  09/18/16 0345  CHOL 146  HDL 32*  LDLCALC 103*  TRIG 56  CHOLHDL 4.6   Thyroid function studies No results for input(s): TSH, T4TOTAL, T3FREE, THYROIDAB in the last 72 hours.  Invalid input(s): FREET3 Anemia work up No results for input(s): VITAMINB12, FOLATE, FERRITIN, TIBC, IRON, RETICCTPCT in the last 72 hours. Microbiology Recent Results (from the past 240 hour(s))  Blood culture (routine x 2)     Status: None (Preliminary result)   Collection Time: 09/17/16  9:10 PM  Result Value Ref Range Status   Specimen Description BLOOD RIGHT ANTECUBITAL  Final   Special Requests   Final    BOTTLES DRAWN AEROBIC AND ANAEROBIC Blood Culture adequate volume   Culture NO GROWTH < 24 HOURS  Final   Report Status PENDING  Incomplete  Blood culture (routine x 2)     Status: None (Preliminary result)   Collection Time: 09/17/16  9:15 PM  Result Value Ref Range Status   Specimen Description BLOOD RIGHT HAND  Final   Special Requests   Final    BOTTLES DRAWN AEROBIC AND ANAEROBIC Blood Culture adequate volume   Culture NO GROWTH < 24 HOURS  Final   Report Status PENDING  Incomplete  MRSA PCR Screening     Status: None   Collection Time: 09/18/16 10:20 AM  Result Value Ref Range Status   MRSA by PCR NEGATIVE NEGATIVE Final    Comment:        The GeneXpert MRSA Assay (FDA approved for NASAL specimens only), is one component of a comprehensive MRSA colonization surveillance program. It is not intended to diagnose MRSA infection nor to guide or monitor treatment for MRSA infections.       Studies:  Dg Chest 2 View  Result Date: 09/17/2016 CLINICAL DATA:  Syncopal episode last night, BILATERAL breast cancer undergoing treatment, former smoker, hypertension EXAM: CHEST  2 VIEW COMPARISON:  C7 2017 FINDINGS: RIGHT jugular Port-A-Cath with tip projecting over SVC. Minimal enlargement of cardiac silhouette. Atherosclerotic calcification aorta. Mediastinal contours and pulmonary  vascularity normal. Lungs clear. No infiltrate, pleural effusion or pneumothorax. Prior BILATERAL mastectomy and LEFT axillary node dissection. Bones demineralized. IMPRESSION: No acute abnormalities. Mild enlargement cardiac silhouette. Aortic atherosclerosis. Electronically Signed   By: Lavonia Dana M.D.   On: 09/17/2016 19:37   Ct Head Wo Contrast  Addendum Date: 09/17/2016   ADDENDUM REPORT: 09/17/2016 22:44 ADDENDUM: Critical Value/emergent results were called by telephone at the time of interpretation on 09/17/2016 at 10:44 pm to Dr. Duffy Bruce , who verbally acknowledged these results. Electronically Signed   By: Ulyses Jarred M.D.   On: 09/17/2016 22:44   Result Date: 09/17/2016 CLINICAL DATA:  Dizziness  and stage IV breast cancer EXAM: CT HEAD WITHOUT CONTRAST TECHNIQUE: Contiguous axial images were obtained from the base of the skull through the vertex without intravenous contrast. COMPARISON:  Brain MRI 05/19/2016 FINDINGS: Brain: There is focal hypoattenuation within the left cerebellar hemisphere. No intracranial hemorrhage. No midline shift or other mass effect. There is periventricular hypoattenuation compatible with chronic microvascular disease. Vascular: No hyperdense vessel or unexpected calcification. Skull: Normal visualized skull base, calvarium and extracranial soft tissues. Sinuses/Orbits: The left mastoid air cells are opacified. The paranasal sinuses are clear. Normal orbits. IMPRESSION: 1. Focal hypoattenuation within the left cerebellum is concerning for acute ischemia, particularly in the context of reported dizziness. No associated hemorrhage or mass effect. MRI may be considered for confirmation. 2. Confluent white matter hypoattenuation may be secondary to chronic microvascular ischemia or a post treatment effect. Electronically Signed: By: Ulyses Jarred M.D. On: 09/17/2016 22:39   Ct Angio Chest Pe W Or Wo Contrast  Result Date: 09/17/2016 CLINICAL DATA:  Left arm erythema  and warmth starting last evening with dizziness onset last night. Stage IV breast cancer. EXAM: CT ANGIOGRAPHY CHEST WITH CONTRAST TECHNIQUE: Multidetector CT imaging of the chest was performed using the standard protocol during bolus administration of intravenous contrast. Multiplanar CT image reconstructions and MIPs were obtained to evaluate the vascular anatomy. CONTRAST:  100 cc Isovue 370 IV COMPARISON:  CT 05/06/2016 FINDINGS: Cardiovascular: Web-like filling defects in the segmental and subsegmental arteries to the lower lobes, best seen series 5, image 59 within the right lower lobe and series 5, image 52 in the left lower lobe. RV/LV ratio = 1.2. The heart is borderline enlarged without pericardial effusion. 4 cm ascending aortic aneurysm without dissection. There is aortic atherosclerosis and coronary arteriosclerosis. Mediastinum/Nodes: Small hiatal hernia. No mediastinal lymphadenopathy. Lungs/Pleura: Dependent atelectasis both lungs with scarring in the left upper lobe possibly radiation induced. No dominant mass within the lungs identified. No pulmonary consolidation or pneumothorax. Upper Abdomen: No acute abnormality. Musculoskeletal: Port catheter projects over the anterior right chest wall with tip in the right atrium. Multifocal osteoblastic metastasis along the dorsal spine. Bilateral mastectomy changes. Left axillary lymph node dissection. Review of the MIP images confirms the above findings. IMPRESSION: 1. Positive for acute PE with CT evidence of right heart strain (RV/LV Ratio = 1.2) consistent with at least submassive (intermediate risk) PE. The presence of right heart strain has been associated with an increased risk of morbidity and mortality. Please activate Code PE by paging [phone]. Tiny web-like nonocclusive filling defects are seen to both lower lobes. Critical Value/emergent results were called by telephone at the time of interpretation on 09/17/2016 at 10:49 pm to the ED  secretary with message to be relayed to Dr. Duffy Bruce. Direct telephone line left in case of any questions from this report. 2. Status post bilateral mastectomy with left upper lobe scarring and dependent atelectasis. 3. 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 Electronically Signed   By: Ashley Royalty M.D.   On: 09/17/2016 22:51   Mr Jeri Cos QZ Contrast  Result Date: 09/18/2016 CLINICAL DATA:  Unsteady gait. Confusion. Personal history of metastatic breast cancer. Abnormal head CT. EXAM: MRI HEAD WITHOUT AND WITH CONTRAST TECHNIQUE: Multiplanar, multiecho pulse sequences of the brain and surrounding structures were obtained without and with intravenous contrast. CONTRAST:  20 cc MultiHance COMPARISON:  CT 09/17/2016. MRI 05/19/2016. Multiple older studies as distant as 09/25/2014 FINDINGS: Brain: Diffusion imaging does not show any acute or  subacute infarction. No abnormality is seen affecting the brainstem or cerebellum. Cerebral hemispheres show chronic small-vessel ischemic changes of the white matter, related to previous radiation. No sign of mass lesion, hemorrhage, hydrocephalus or extra-axial collection. Vascular: Major vessels at the base of the brain show flow. Skull and upper cervical spine: Negative. No residual or recurrent enhancing bone lesions. Sinuses/Orbits: Paranasal sinuses are clear. Orbits are negative. There are bilateral mastoid effusions, minimal on the right and quite extensive on the left. This could relate to symptoms. Other: None IMPRESSION: No evidence of acute or subacute infarction. Specifically, the cerebellum is normal. No evidence of metastatic disease. Chronic white matter changes related to previous whole-brain radiation. Mastoid effusions much more extensive on the left than the right. This could be symptomatic. These are chronic however. Electronically Signed   By: Nelson Chimes M.D.   On: 09/18/2016 09:41    Assessment:  71 y.o. [place] woman admitted 09/17/2016 with left upper extremity cellulitis and bilateral segmental and subsegmental pulmonary emboli, with negative left upper extremity and bilateral lower extremity Dopplers 09/19/2011. Her breast cancer history is as follows  (1)  status post bilateral breast biopsies 07/24/2011, showing,     (a) on the right, a clinical T2 N0 papillary/ductal breast cancer, estrogen and progesterone receptor positive, HER-2 negative, with an MIB-1 of 10%;    (b) on the left, a clinical T3 N1, stage IIIA invasive ductal carcinoma, grade 3, triple positive, with an MIB-1 of 60%.  (2)  Status post 4 dose dense cycles of doxorubicin/ cyclophosphamide followed by 4 dose dense cycles of paclitaxel and trastuzumab completed 12/09/2011  (3) the trastuzumab was continued for a total of one year (to 11/01/2012). Final echo on 11/04/2012 showing a well preserved ejection fraction of 55-60%.  (4) s/p bilateral mastectomies 01/21/2012 showing             (a) on the Right, an 8 mm invasive papillary carcinoma, grade 1, ypT1b ypN0             (b) on the Left, multiple microscopic foci of residual  Invasive ductal carcinoma with evidence of dermal lymphatic involvement, pyT1a/T4 pyN0  (5)  Postmastectomy radiation, completed 04/15/2012  (6) Started anastrazole 04/16/2012; normal dexa scan 05/19/2014 at the [facility] 09/11/2014: brain, bones, left effusion (7) CT angiogram 09/11/2014 shows new left pericardial effusion and new mediastinal and hilar lymphadenopathy; there were no suspicious upper abdominal findings; Cytology from the left effusion 09/11/2014 ([id number]) shows malignant cells which are HER-2 positive  (8) Whole body bone scan on 09/25/14 showed metastatic foci throughout axial and appendicular skeleton. Areas of most potential concern include disease in the thoracic spine, disease in the right acetabulum, right superior pubic ramus and  possibly proximal right femur, disease in the right humeral shaft and in both femoral shafts.  (8) Started trastuzumab/pertuzumab 09/26/2014, discontinued after 11/29/2014 dose, w progression             (a) T-DM1 started 01/02/2015, repeated every 21 days             (b) echocardiogram 09/02/2016 hows an ejection fraction of 60-65 %  (9) Brain MRI on 09/25/14 was positive for numerous small enhancing brain mets and calvarium/bone mets              (a) whole brain radiation on 10/09/14--10/27/2014             (b) brain MRI 01/07/2015 shows a complete clinical response             (  c) REPEAT BRAIN mri 03/18/2015 SHOWS NO NEW LESIONS             (d) repeat brain MRI with and without contrast 06/29/2015 continues to show no recurrent lesions in the brain             (e) brain MRI 10/12/2015 shows no evidence of recurrent brain metastases. She does have a large left mastoid effusion with trace right mastoid fluid.             (f) brain MRI 02/12/2016 stable--no evidence of active disease             (g) brain MRI 05/19/2016 stable, no evidence of active disease  (10) started zolendronate 11/29/2014, repeated every 12 weeks  11)  left pleural effusion re-tapped 12/08/2014 (a) left chest tube placement 12/14/2014, removedd 12/21/2014  (12) large pericardial effusion per echo 12/13/2014  (a) pericardial window placement 12/14/2014; fluid is hemorrhagic; path negative  (13) Right-sided pulmonary emboli noted on CT scan 12/13/2014-- never anticoagulated             (a) no pulmonary emboli demonstrated on non-dedicated chest CT 04/13/2015  (14) bilateral segmental and subsegmental pulmonary emboli documented 09/17/2016, with negative left upper extremity and bilateral lower extremity Dopplers 09/18/2016  (a) unfractionated heparin started 09/17/2016, transitioned to Lovenox 09/19/2016    Plan:  This is Pat's second episode of pulmonary embolism and even though  there was an associated left upper extremity cellulitis perhaps provoking at I think long-term anticoagulation will be necessary. She will be on Lovenox as ordered 3-6 months after which we will consider switching to perhaps a more convenient although less well-documented regimen.  She has a routine appointment with me 10/09/2016, but I have added an appointment for her 09/29/2016 which she may keep at her discretion. That is just to help out in case she has any difficulty with her current treatment or any further symptoms.  I am delighted that the CT scan of the chest and brain MRI showed no evidence of visceral breast cancer metastases. Her bone metastases are stable  I greatly appreciate the help of the hospitalist team in the care of this patient!   Chauncey Cruel, MD 09/19/2016  1:09 PM Medical Oncology and Hematology [facility] [address] [place], [place] [address] Tel. [phone]    Fax. [phone]

## 2016-09-19 NOTE — Care Management Note (Addendum)
Case Management Note Original Note Created by Ashok Cordia 09/18/16  Patient Details  Name: Linda [name] MRN: [id number] Date of Birth: [birth year]  Subjective/Objective:                  From home with adult child and his family. /71 y.o. 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   Action/Plan: Admit status INPATIENT (PE (pulmonary thromboembolism)); anticipate discharge [place].   Expected Discharge Date:   (unsure)               Expected Discharge Plan:  Home/Self Care  In-House Referral:  NA  Discharge planning Services  CM Consult  Post Acute Care Choice:    Choice offered to:     DME Arranged:    DME Agency:     HH Arranged:    HH Agency:     Status of Service:  In process, will continue to follow  If discussed at Long Length of Stay Meetings, dates discussed:    Additional Comments: 09/19/2016  95mg  BID for indefinite period of time - Benefit check submitted  CM received Lovenox benefit check consult - CM requested bedside nurse to gain the following information - dose, frequency and duration.  Once CM receives this - benefit check will be submitted Maryclare Labrador, RN 09/19/2016, 11:44 AM

## 2016-09-19 NOTE — Progress Notes (Signed)
Pt educated about safety and importance of bed alarm during the night however pt refuses to be on bed alarm. Will continue to round on patient.   Araceli Arango, RN    

## 2016-09-19 NOTE — Progress Notes (Signed)
PROGRESS NOTE    Linda [name]  DJS:[id number] DOB: 01-Nov-1945 DOA: 09/17/2016 PCP: No PCP Per Patient   Brief Narrative:  71 year old female with past medical history of breast cancer that is post bilateral mastectomy, chemoradiation and currently on chemotherapy, hypertension, GERD, peripheral vascular disease, diastolic CHF, pericardial effusion came to the ER with the complaints of left-sided arm swelling. At that time she also had some change in mental status therefore CT of the head was done which showed focal hypoattenuation within the left cerebellum concerning for possible acute ischemic stroke. Neurology was consulted and MRI was done which was negative for stroke or any metastatic disease in her brain. PT of the chest was also done which revealed pulmonary embolism with evidence of right-sided heart strain with concerns of submassive pulmonary embolism. She was started on heparin drip and then transitioned to Lovenox 1 mg-kilogram every 12 hours as needed.   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 I have discussed this with the patient and she is  agreeable to it. I will also consult case management will assist with arrangements and cost. Case also discussed with Dr. Jana Hakim. -Echocardiogram done in March 2018 was relatively normal.  Left upper extremity swelling/redness-questionable cellulitis versus lymphedema; improved -Dopplers have been negative -Keflex started on 09/18/2016-we'll continue for 5 days -Give her lymphedema sleeve  -Place arm on the pillow for elevation.  Concerns for stroke/encephalopathy; resolved  -CT of the head showed questionable acute ischemia therefore MRI of the brain was done which was negative for any acute pathology including any metastatic disease -EEG done-negative  Chronic diastolic congestive heart failure, compensated/stable -We will restart her Lasix and spironolactone tomorrow/at the time of discharge  Hypertension -Continue IV hydralazine as needed for now  GERD -Protonix as needed  Bilateral breast cancer metastasized to brain and lungs? -Status post bilateral mastectomy, radiation therapy and currently getting chemotherapy -Follows up with Dr. Jana Hakim   DVT prophylaxis: Lovenox Code Status: Full Family Communication:  Family present at bedside Disposition Plan: Transferred to telemetry. Likely discharge in next 24-48 hrs  Consultants:   Oncology  Procedures:   None  Antimicrobials:   Keflex 09/18/2016   Subjective: Patient states she feels well better this morning and also reports a left extremity is less painful/swollen and decreased erythema. She remains afebrile and does not have any other complaints this morning.  Objective: Vitals:   09/19/16 0800 09/19/16 0805 09/19/16 0900 09/19/16 1000  BP:  (!) 113/51 91/70 (!) 110/52  Pulse: 76 75 71 83  Resp: 16 18 15 16   Temp:      TempSrc:      SpO2: 98% 98% 98% 97%  Weight:  Height:        Intake/Output Summary (Last 24 hours) at 09/19/16 1202 Last data filed at 09/19/16 1048  Gross per 24 hour  Intake             529.2 ml  Output              551 ml  Net            -21.8 ml   Filed Weights   09/17/16 1824 09/18/16 1022  Weight: 95.5 kg (210 lb 7 oz) 93 kg (205 lb 0.4 oz)    Examination:  General exam: Appears calm and comfortable  Respiratory system: Clear to auscultation. Respiratory effort normal. Cardiovascular system: S1 & S2 heard, RRR. No JVD, murmurs, rubs, gallops or clicks. No pedal edema. Gastrointestinal system: Abdomen is nondistended, soft and nontender. No organomegaly or masses felt. Normal bowel sounds heard. Central nervous system: Alert and oriented. No focal neurological deficits. Extremities: Symmetric 5 x 5 power.Nonpitting edema for left upper extremity with significant improvement of erythema. Skin: No rashes, lesions or ulcers Psychiatry: Judgement and insight appear normal. Mood & affect appropriate.     Data Reviewed:   CBC:  Recent Labs Lab 09/17/16 1842 09/18/16 0345 09/19/16 0229  WBC 9.0 8.5 8.3  HGB 10.4* 10.0* 9.2*  HCT 31.5* 30.8* 27.3*  MCV 94.6 93.9 93.8  PLT 87* 80* 94*   Basic Metabolic Panel:  Recent Labs Lab 09/17/16 1842 09/18/16 0345  NA 134* 138  K 3.4* 3.4*  CL 99* 104  CO2 26 27  GLUCOSE 164* 113*  BUN 18 15  CREATININE 0.72 0.60  CALCIUM 9.1 8.9   GFR: Estimated Creatinine Clearance: 79.4 mL/min (by C-G formula based on SCr of 0.6 mg/dL). Liver Function Tests: No results for input(s): AST, ALT, ALKPHOS, BILITOT, PROT, ALBUMIN in the last 168 hours. No results for input(s): LIPASE, AMYLASE in the last 168 hours. No results for input(s): AMMONIA in the last 168 hours. Coagulation Profile: No results for input(s): INR, PROTIME in the last 168 hours. Cardiac Enzymes:  Recent Labs Lab 09/17/16 1842  TROPONINI <0.03   BNP (last 3 results) No results for input(s): PROBNP in the last 8760 hours. HbA1C:  Recent Labs  09/18/16 0345  HGBA1C 5.4   CBG:  Recent Labs Lab 09/18/16 1121 09/18/16 1612  09/19/16 0727  GLUCAP 105* 125* 85   Lipid Profile:  Recent Labs  09/18/16 0345  CHOL 146  HDL 32*  LDLCALC 103*  TRIG 56  CHOLHDL 4.6   Thyroid Function Tests: No results for input(s): TSH, T4TOTAL, FREET4, T3FREE, THYROIDAB in the last 72 hours. Anemia Panel: No results for input(s): VITAMINB12, FOLATE, FERRITIN, TIBC, IRON, RETICCTPCT in the last 72 hours. Sepsis Labs:  Recent Labs Lab 09/17/16 2117  LATICACIDVEN 1.47    Recent Results (from the past 240 hour(s))  Blood culture (routine x 2)     Status: None (Preliminary result)   Collection Time: 09/17/16  9:10 PM  Result Value Ref Range Status   Specimen Description BLOOD RIGHT ANTECUBITAL  Final   Special Requests   Final    BOTTLES DRAWN AEROBIC AND ANAEROBIC Blood Culture adequate volume   Culture NO GROWTH < 24 HOURS  Final   Report Status PENDING  Incomplete  Blood culture (routine x 2)     Status: None (Preliminary result)   Collection Time: 09/17/16  9:15 PM  Result Value Ref Range Status   Specimen Description BLOOD RIGHT HAND  Final   Special Requests   Final    BOTTLES DRAWN AEROBIC AND ANAEROBIC Blood Culture adequate volume   Culture NO GROWTH < 24 HOURS  Final   Report Status PENDING  Incomplete  MRSA PCR Screening     Status: None   Collection Time: 09/18/16 10:20 AM  Result Value Ref Range Status   MRSA by PCR NEGATIVE NEGATIVE Final    Comment:        The GeneXpert MRSA Assay (FDA approved for NASAL specimens only), is one component of a comprehensive MRSA colonization surveillance program. It is not intended to diagnose MRSA infection nor to guide or monitor treatment for MRSA infections.          Radiology Studies: Dg Chest 2 View  Result Date: 09/17/2016 CLINICAL DATA:  Syncopal episode last night, BILATERAL breast cancer undergoing treatment, former smoker, hypertension EXAM: CHEST  2 VIEW COMPARISON:  C7 2017 FINDINGS: RIGHT jugular Port-A-Cath with tip projecting over SVC.  Minimal enlargement of cardiac silhouette. Atherosclerotic calcification aorta. Mediastinal contours and pulmonary vascularity normal. Lungs clear. No infiltrate, pleural effusion or pneumothorax. Prior BILATERAL mastectomy and LEFT axillary node dissection. Bones demineralized. IMPRESSION: No acute abnormalities. Mild enlargement cardiac silhouette. Aortic atherosclerosis. Electronically Signed   By: Lavonia Dana M.D.   On: 09/17/2016 19:37   Ct Head Wo Contrast  Addendum Date: 09/17/2016   ADDENDUM REPORT: 09/17/2016 22:44 ADDENDUM: Critical Value/emergent results were called by telephone at the time of interpretation on 09/17/2016 at 10:44 pm to Dr. Duffy Bruce , who verbally acknowledged these results. Electronically Signed   By: Ulyses Jarred M.D.   On: 09/17/2016 22:44   Result Date: 09/17/2016 CLINICAL DATA:  Dizziness and stage IV breast cancer EXAM: CT HEAD WITHOUT CONTRAST TECHNIQUE: Contiguous axial images were obtained from the base of the skull through the vertex without intravenous contrast. COMPARISON:  Brain MRI 05/19/2016 FINDINGS: Brain: There is focal hypoattenuation within the left cerebellar hemisphere. No intracranial hemorrhage. No midline shift or other mass effect. There is periventricular hypoattenuation compatible with chronic microvascular disease. Vascular: No hyperdense vessel or unexpected calcification. Skull: Normal visualized skull base, calvarium and extracranial soft tissues. Sinuses/Orbits: The left mastoid air cells are opacified. The paranasal sinuses are clear. Normal orbits. IMPRESSION: 1. Focal hypoattenuation within the left cerebellum is concerning for acute ischemia, particularly in the context of reported dizziness. No associated hemorrhage or mass effect. MRI may be considered for confirmation. 2. Confluent white matter hypoattenuation may be secondary to chronic microvascular ischemia or a post treatment effect. Electronically Signed: By: Ulyses Jarred M.D. On:  09/17/2016 22:39   Ct Angio Chest Pe W Or Wo Contrast  Result Date: 09/17/2016 CLINICAL DATA:  Left arm erythema and warmth starting last evening with dizziness onset last night. Stage IV breast cancer. EXAM: CT ANGIOGRAPHY CHEST WITH CONTRAST TECHNIQUE: Multidetector CT imaging of the chest was performed using the standard protocol during bolus administration of intravenous contrast. Multiplanar CT image reconstructions and MIPs were obtained to evaluate the vascular anatomy. CONTRAST:  100 cc Isovue 370 IV COMPARISON:  CT 05/06/2016 FINDINGS: Cardiovascular: Web-like filling defects in the segmental and subsegmental arteries to the lower lobes, best seen series 5, image 59 within the right lower lobe and series 5, image 52 in the left lower lobe. RV/LV ratio = 1.2. The heart is borderline enlarged without pericardial effusion. 4 cm ascending aortic aneurysm without dissection. There is aortic atherosclerosis and coronary arteriosclerosis. Mediastinum/Nodes: Small hiatal hernia. No mediastinal lymphadenopathy. Lungs/Pleura:  Dependent atelectasis both lungs with scarring in the left upper lobe possibly radiation induced. No dominant mass within the lungs identified. No pulmonary consolidation or pneumothorax. Upper Abdomen: No acute abnormality. Musculoskeletal: Port catheter projects over the anterior right chest wall with tip in the right atrium. Multifocal osteoblastic metastasis along the dorsal spine. Bilateral mastectomy changes. Left axillary lymph node dissection. Review of the MIP images confirms the above findings. IMPRESSION: 1. Positive for acute PE with CT evidence of right heart strain (RV/LV Ratio = 1.2) consistent with at least submassive (intermediate risk) PE. The presence of right heart strain has been associated with an increased risk of morbidity and mortality. Please activate Code PE by paging [phone]. Tiny web-like nonocclusive filling defects are seen to both lower lobes. Critical  Value/emergent results were called by telephone at the time of interpretation on 09/17/2016 at 10:49 pm to the ED secretary with message to be relayed to Dr. Duffy Bruce. Direct telephone line left in case of any questions from this report. 2. Status post bilateral mastectomy with left upper lobe scarring and dependent atelectasis. 3. 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 Electronically Signed   By: Ashley Royalty M.D.   On: 09/17/2016 22:51   Mr Jeri Cos IR Contrast  Result Date: 09/18/2016 CLINICAL DATA:  Unsteady gait. Confusion. Personal history of metastatic breast cancer. Abnormal head CT. EXAM: MRI HEAD WITHOUT AND WITH CONTRAST TECHNIQUE: Multiplanar, multiecho pulse sequences of the brain and surrounding structures were obtained without and with intravenous contrast. CONTRAST:  20 cc MultiHance COMPARISON:  CT 09/17/2016. MRI 05/19/2016. Multiple older studies as distant as 09/25/2014 FINDINGS: Brain: Diffusion imaging does not show any acute or subacute infarction. No abnormality is seen affecting the brainstem or cerebellum. Cerebral hemispheres show chronic small-vessel ischemic changes of the white matter, related to previous radiation. No sign of mass lesion, hemorrhage, hydrocephalus or extra-axial collection. Vascular: Major vessels at the base of the brain show flow. Skull and upper cervical spine: Negative. No residual or recurrent enhancing bone lesions. Sinuses/Orbits: Paranasal sinuses are clear. Orbits are negative. There are bilateral mastoid effusions, minimal on the right and quite extensive on the left. This could relate to symptoms. Other: None IMPRESSION: No evidence of acute or subacute infarction. Specifically, the cerebellum is normal. No evidence of metastatic disease. Chronic white matter changes related to previous whole-brain radiation. Mastoid effusions much more extensive on the left than the right. This could be symptomatic.  These are chronic however. Electronically Signed   By: Nelson Chimes M.D.   On: 09/18/2016 09:41        Scheduled Meds: . anastrozole  1 mg Oral Daily  . B-complex with vitamin C  1 tablet Oral Daily  . cephALEXin  500 mg Oral Q8H  . cholecalciferol  1,000 Units Oral Daily  . docusate sodium  100 mg Oral BID  . enoxaparin (LOVENOX) injection  1 mg/kg Subcutaneous Q12H  . magnesium oxide  400 mg Oral Daily  . multivitamins with iron  1 tablet Oral Daily  . sodium chloride flush  3 mL Intravenous Q12H   Continuous Infusions:   LOS: 1 day    Time spent: 35 mins     Jacque Byron Arsenio Loader, MD [facility] Pager [phone]   If 7PM-7AM, please contact night-coverage [email or web address] Password [email or web address] 09/19/2016, 12:02 PM

## 2016-09-19 NOTE — Evaluation (Addendum)
Physical Therapy Evaluation Patient Details Name: Linda [name] MRN: [id number] DOB: Nov 01, 1945 Today's Date: 09/19/2016   History of Present Illness  Pt is a 71 y/o female admitted after having increased frequency of falls with R UE pain and AMS. Pt found to have an acute PE with R heart strain, heparin drip initiated on 4/5. Pt negative for an acute CVA/TIA. PMH including but not limited to CHF, PVD, HTN and breast cancer with mets to brain and lungs s/p bilateral mastectomy in 2013.  Clinical Impression  Pt presented supine in bed with HOB elevated, awake and willing to participate in therapy session. Prior to admission, pt reported that she was mod I with functional mobility, using a SPC to ambulate and independent with ADLs. Pt with mild instability with ambulation but no LOB or need for physical assistance, min guard for safety without an AD. Pt would continue to benefit from skilled physical therapy services at this time while admitted and after d/c to address the below listed limitations in order to improve overall safety and independence with functional mobility.     Follow Up Recommendations Home health PT    Equipment Recommendations  None recommended by PT    Recommendations for Other Services       Precautions / Restrictions Precautions Precautions: Fall Restrictions Weight Bearing Restrictions: No      Mobility  Bed Mobility Overal bed mobility: Modified Independent                Transfers Overall transfer level: Needs assistance Equipment used: None Transfers: Sit to/from Stand Sit to Stand: Min guard         General transfer comment: increased time, min guard for safety  Ambulation/Gait Ambulation/Gait assistance: Min guard Ambulation Distance (Feet): 40 Feet Assistive device: None Gait Pattern/deviations: Step-through pattern;Decreased stride length Gait velocity: decreased Gait velocity interpretation: Below normal speed for  age/gender General Gait Details: mild instability with ambulation but no overt LOB or need for physical assistance, min guard for safety  Stairs            Wheelchair Mobility    Modified Rankin (Stroke Patients Only)       Balance Overall balance assessment: Needs assistance Sitting-balance support: Feet supported Sitting balance-Leahy Scale: Fair     Standing balance support: During functional activity;No upper extremity supported Standing balance-Leahy Scale: Fair                               Pertinent Vitals/Pain Pain Assessment: Faces Faces Pain Scale: Hurts little more Pain Location: R UE Pain Descriptors / Indicators: Sore Pain Intervention(s): Monitored during session    Home Living Family/patient expects to be discharged to:: Private residence Living Arrangements: Children Available Help at Discharge: Family;Available PRN/intermittently Type of Home: Other(Comment) ([place]house) Home Access: Level entry     Home Layout: Two level Home Equipment: Cane - single point      Prior Function Level of Independence: Independent with assistive device(s)         Comments: pt reported that she ambulates with use of SPC. 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     Hand Dominance        Extremity/Trunk Assessment   Upper Extremity Assessment Upper Extremity Assessment: Overall WFL for tasks assessed    Lower Extremity Assessment Lower Extremity Assessment: Overall WFL for tasks assessed       Communication   Communication: No difficulties  Cognition Arousal/Alertness: Awake/alert Behavior During Therapy: WFL for tasks assessed/performed Overall Cognitive Status: Within Functional Limits for tasks assessed                                        General Comments      Exercises     Assessment/Plan    PT Assessment Patient needs continued PT services  PT Problem List Decreased balance;Decreased  mobility;Decreased coordination;Decreased knowledge of use of DME;Decreased safety awareness;Pain       PT Treatment Interventions DME instruction;Stair training;Gait training;Therapeutic exercise;Balance training;Therapeutic activities;Functional mobility training;Neuromuscular re-education;Patient/family education    PT Goals (Current goals can be found in the Care Plan section)  Acute Rehab PT Goals Patient Stated Goal: return home PT Goal Formulation: With patient Time For Goal Achievement: 10/03/16 Potential to Achieve Goals: Good    Frequency Min 3X/week   Barriers to discharge        Co-evaluation               End of Session Equipment Utilized During Treatment: Gait belt Activity Tolerance: Patient tolerated treatment well Patient left: in chair;with call bell/phone within reach;with chair alarm set Nurse Communication: Mobility status PT Visit Diagnosis: Unsteadiness on feet (R26.81)    Time: 2010-0712 PT Time Calculation (min) (ACUTE ONLY): 15 min   Charges:   PT Evaluation $PT Eval Low Complexity: 1 Procedure     PT G Codes:        Sherie Don, PT, DPT [place] 09/19/2016, 4:10 PM

## 2016-09-19 NOTE — Progress Notes (Signed)
Pt arrived on [facility], transferred from 2H22; pt has been oriented to room, surroundings, and any applicable equipment. VSS.  CCMD notified of telemetry. Pt has been educated on the [employer] . Pt refuses to wear yellow socks.  Pt in stable condition.

## 2016-09-19 NOTE — Progress Notes (Signed)
Noted consult for Lovenox co pay CM talked to patient, she has Medicare A&B and [employer], pharmacy of choice is [facility], quantity; [facility]- patient has [employer] drug plan, they are unable to tell me the co pay until they have the prescription; CM talked to patient about her [facility] plan, she does not have her card with her / CM unable to determine the co pay at this time; patient stated " all of my prescriptions are $6.00 or less." B Pennie Rushing [phone]

## 2016-09-19 NOTE — Progress Notes (Signed)
ANTICOAGULATION CONSULT NOTE   Pharmacy Consult for Heparin Indication: pulmonary embolus  No Known Allergies  Patient Measurements: Height: 5\' 9"  (175.3 cm) Weight: 205 lb 0.4 oz (93 kg) IBW/kg (Calculated) : 66.2 Heparin Dosing Weight: 86 kg  Vital Signs: Temp: 97.7 F (36.5 C) (04/06 0300) Temp Source: Oral (04/06 0300) BP: 115/51 (04/06 0700) Pulse Rate: 77 (04/06 0700)  Labs:  Recent Labs  09/17/16 1842 09/18/16 0345 09/18/16 1146 09/18/16 2211 09/19/16 0229  HGB 10.4* 10.0*  --   --  9.2*  HCT 31.5* 30.8*  --   --  27.3*  PLT 87* 80*  --   --  94*  HEPARINUNFRC  --   --  1.00* 0.60 0.58  CREATININE 0.72 0.60  --   --   --   TROPONINI <0.03  --   --   --   --     Estimated Creatinine Clearance: 79.4 mL/min (by C-G formula based on SCr of 0.6 mg/dL).   Medical History: Past Medical History:  Diagnosis Date  . Acute on chronic diastolic CHF (congestive heart failure) ([place])   . Acute pulmonary embolism ([place]) 12/13/2014  . Allergy   . Blood transfusion without reported diagnosis   . Breast cancer ([place]) 07/30/11 dx   Right  invasive ductal ca 7 0'clock,& left breast=invasive ductal ca  and dcis, left axilla nlymph node, metastatic ca  . Bronchitis    hx  . Cancer ([place]) 08-13-11   07-31-11-Dx. Bilarteral Breast cancer-left greater than rt.  . Cancer of central portion of left female breast ([place]) 08/01/2011  . Diabetes mellitus without complication ([place])   . GERD (gastroesophageal reflux disease)    doing well  . Hematuria, undiagnosed cause 08-13-11   Being evaluated by [facility] 08-14-11  . History of radiation therapy 02/20/12-04/15/12   left breast,total 60.4 Gy  . Hypertension   . Radiation-induced dermatitis 03/26/2012   Using radioplex cream, plus neosporin.   . S/P radiation therapy 10/09/14-10/27/14   whole brain 37.5Gy/89fx  . Seasonal allergies   . Seroma 02/04/12   right breast  200cc removed  erythema on right side  . Wears partial dentures     wears upper and lower partial    Medications:  . heparin 900 Units/hr (09/19/16 0700)    Assessment: 71 y.o. F with metastatic breast cancer presents with LUE warmth/erythema. Also with LOC PTA. Chest CT positive for acute PE with CT evidence of right heart strain (RV/LV Ratio = 1.2) consistent with at least submassive (intermediate risk) PE. Noted pt with h/o PE (11/2014) but never anticoagulated for this. Hgb and platelet count low but stable at baseline.   Heparin level remains at goal this morning on heparin gtt.  No bleeding or complications noted.  Goal of Therapy:  Heparin level 0.3-0.7 units/ml Monitor platelets by anticoagulation protocol: Yes   Plan:  Continue IV heparin at current rate. F/u plans for oral anticoagulation when able. Daily heparin level and CBC  Uvaldo Rising, BCPS  Clinical Pharmacist Pager [phone]  09/19/2016 9:09 AM

## 2016-09-19 NOTE — Telephone Encounter (Signed)
Left message re 4/16 lab/fu. Schedule mailed.

## 2016-09-20 ENCOUNTER — Inpatient Hospital Stay (HOSPITAL_COMMUNITY): Payer: Medicare Other

## 2016-09-20 DIAGNOSIS — I2699 Other pulmonary embolism without acute cor pulmonale: Secondary | ICD-10-CM

## 2016-09-20 LAB — CBC
HEMATOCRIT: 22.7 % — AB (ref 36.0–46.0)
HEMATOCRIT: 22.4 % — AB (ref 36.0–46.0)
HEMOGLOBIN: 7.4 g/dL — AB (ref 12.0–15.0)
Hemoglobin: 7.3 g/dL — ABNORMAL LOW (ref 12.0–15.0)
MCH: 30.5 pg (ref 26.0–34.0)
MCH: 30.3 pg (ref 26.0–34.0)
MCHC: 32.6 g/dL (ref 30.0–36.0)
MCHC: 32.6 g/dL (ref 30.0–36.0)
MCV: 93.4 fL (ref 78.0–100.0)
MCV: 92.9 fL (ref 78.0–100.0)
Platelets: 114 10*3/uL — ABNORMAL LOW (ref 150–400)
Platelets: 127 10*3/uL — ABNORMAL LOW (ref 150–400)
RBC: 2.43 MIL/uL — ABNORMAL LOW (ref 3.87–5.11)
RBC: 2.41 MIL/uL — AB (ref 3.87–5.11)
RDW: 17.1 % — AB (ref 11.5–15.5)
RDW: 16.9 % — ABNORMAL HIGH (ref 11.5–15.5)
WBC: 6.3 10*3/uL (ref 4.0–10.5)
WBC: 7 10*3/uL (ref 4.0–10.5)

## 2016-09-20 LAB — BLOOD CULTURE ID PANEL (REFLEXED)
Acinetobacter baumannii: NOT DETECTED
CANDIDA KRUSEI: NOT DETECTED
CANDIDA PARAPSILOSIS: NOT DETECTED
CANDIDA TROPICALIS: NOT DETECTED
Candida albicans: NOT DETECTED
Candida glabrata: NOT DETECTED
ENTEROBACTER CLOACAE COMPLEX: NOT DETECTED
ENTEROBACTERIACEAE SPECIES: NOT DETECTED
Enterococcus species: NOT DETECTED
Escherichia coli: NOT DETECTED
HAEMOPHILUS INFLUENZAE: NOT DETECTED
KLEBSIELLA OXYTOCA: NOT DETECTED
KLEBSIELLA PNEUMONIAE: NOT DETECTED
Listeria monocytogenes: NOT DETECTED
METHICILLIN RESISTANCE: NOT DETECTED
Neisseria meningitidis: NOT DETECTED
Proteus species: NOT DETECTED
Pseudomonas aeruginosa: NOT DETECTED
SERRATIA MARCESCENS: NOT DETECTED
STAPHYLOCOCCUS AUREUS BCID: NOT DETECTED
STREPTOCOCCUS PYOGENES: NOT DETECTED
Staphylococcus species: DETECTED — AB
Streptococcus agalactiae: NOT DETECTED
Streptococcus pneumoniae: NOT DETECTED
Streptococcus species: NOT DETECTED

## 2016-09-20 LAB — GLUCOSE, CAPILLARY: Glucose-Capillary: 100 mg/dL — ABNORMAL HIGH (ref 65–99)

## 2016-09-20 LAB — ECHOCARDIOGRAM COMPLETE
Height: 69 in
Weight: 3320 oz

## 2016-09-20 MED ORDER — SENNA 8.6 MG PO TABS
1.0000 | ORAL_TABLET | Freq: Every day | ORAL | Status: DC | PRN
  Administered 2016-09-20 – 2016-09-21 (×2): 8.6 mg via ORAL
  Filled 2016-09-20 (×2): qty 1

## 2016-09-20 MED ORDER — POLYETHYLENE GLYCOL 3350 17 G PO PACK
17.0000 g | PACK | Freq: Every day | ORAL | Status: DC | PRN
  Administered 2016-09-20 – 2016-09-23 (×3): 17 g via ORAL
  Filled 2016-09-20 (×3): qty 1

## 2016-09-20 NOTE — Progress Notes (Signed)
CRITICAL VALUE ALERT  Critical value received:  Hemoglobin 7.4   Date of notification:  09/20/2016  Time of notification:  Nurse was not notified   Critical value read back:No.  Nurse who received alert:    MD notified (1st page):  M. Lynch   Time of first page:  6:01  MD notified (2nd page):  Time of second page:  Responding MD:  M. Lynch   Time MD responded:    No new orders at this time.   Serah Nicoletti, RN

## 2016-09-20 NOTE — Progress Notes (Signed)
Patient educated about safety and importance of bed alarm.  Patient refuses bed alarm at this time. A.Orlan Aversa, RN

## 2016-09-20 NOTE — Progress Notes (Signed)
PROGRESS NOTE    Linda [name]  ZOX:[id number] DOB: 12-14-1945 DOA: 09/17/2016 PCP: No PCP Per Patient   Brief Narrative:  71 year old female with past medical history of breast cancer that is post bilateral mastectomy, chemoradiation and currently on chemotherapy, hypertension, GERD, peripheral vascular disease, diastolic CHF, pericardial effusion came to the ER with the complaints of left-sided arm swelling. At that time she also had some change in mental status therefore CT of the head was done which showed focal hypoattenuation within the left cerebellum concerning for possible acute ischemic stroke. Neurology was consulted and MRI was done which was negative for stroke or any metastatic disease in her brain. PT of the chest was also done which revealed pulmonary embolism with evidence of right-sided heart strain with concerns of submassive pulmonary embolism. She was started on heparin drip and then transitioned to Lovenox 1 mg-kilogram every 12 hours as needed.   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  -Echocardiogram done in March 2070 was relatively normal.  Anemia -Unknown etiology. No active signs of blood  loss. This could be from blood draws -We'll recheck CBC again later today and then tomorrow morning. Continue Lovenox for now -Transfuse if hemoglobin less than 7.  Left upper extremity swelling/redness-questionable cellulitis versus lymphedema; improved -Dopplers have been negative -Keflex started on 09/18/2016-we'll continue for 5 days -Give her lymphedema sleeve  -Place arm on the pillow for elevation.  Concerns for stroke/encephalopathy; resolved  -CT of the head showed questionable acute ischemia therefore MRI of the brain was done which was negative for any acute pathology including any metastatic disease -EEG done-negative  Chronic diastolic congestive heart failure, compensated/stable -We will restart her Lasix and spironolactone tomorrow/at the time of discharge  Hypertension -Continue IV hydralazine as needed for now  GERD -Protonix as needed  Bilateral breast cancer metastasized to brain and lungs? -Status post bilateral mastectomy, radiation therapy and currently getting chemotherapy -Follows up with Dr. Jana Hakim. Has tentative appts on 4/16 and 10/09/16  Constipation -Bowel regimen as needed  Physical therapy recommended home PT due generalized deconditioning. It has been ordered.  DVT prophylaxis: Lovenox Code Status: Full Family Communication:  Family present at bedside Disposition Plan: Continue inpatient stay and monitor hemoglobin especially since she is on therapeutic Lovenox. If remains stable may be a little discharge tomorrow.  Consultants:   Oncology  Procedures:   None  Antimicrobials:   Keflex 09/18/2016   Subjective: Patient states she is feeling much better right now and her left extremity swelling is better as well. She remains afebrile. She denies any signs of blood loss such as melanoma, bright red blood per rectum or throwing up any blood.  Objective: Vitals:   09/19/16 2019 09/20/16 0148 09/20/16 0547 09/20/16 0603  BP: (!) 147/49  [name])  146/54  133/74  Pulse: 83 76  89  Resp: 18 18  18   Temp: 97.9 F (36.6 C) 97.7 F (36.5 C)  97.8 F (36.6 C)  TempSrc: Oral Oral  Oral  SpO2: 95% 94%  97%  Weight:   94.1 kg (207 lb 8 oz)   Height:        Intake/Output Summary (Last 24 hours) at 09/20/16 1122 Last data filed at 09/20/16 0900  Gross per 24 hour  Intake              730 ml  Output              550 ml  Net              180 ml   Filed Weights   09/18/16 1022 09/19/16 1700 09/20/16 0547  Weight: 93 kg (205 lb 0.4 oz) 94.1 kg (207 lb 7.3 oz) 94.1 kg (207 lb 8 oz)    Examination:  General exam: Appears calm and comfortable  Respiratory system: Clear to auscultation. Respiratory effort normal. Cardiovascular system: S1 & S2 heard, RRR. No JVD, murmurs, rubs, gallops or clicks. No pedal edema. Gastrointestinal system: Abdomen is nondistended, soft and nontender. No organomegaly or masses felt. Normal bowel sounds heard. Central nervous system: Alert and oriented. No focal neurological deficits. Extremities: Symmetric 5 x 5 power.Nonpitting edema for left upper extremity with significant improvement of erythema. Skin: No rashes, lesions or ulcers Psychiatry: Judgement and insight appear normal. Mood & affect appropriate.     Data Reviewed:   CBC:  Recent Labs Lab 09/17/16 1842 09/18/16 0345 09/19/16 0229 09/20/16 0315  WBC 9.0 8.5 8.3 6.3  HGB 10.4* 10.0* 9.2* 7.4*  HCT 31.5* 30.8* 27.3* 22.7*  MCV 94.6 93.9 93.8 93.4  PLT 87* 80* 94* 371*   Basic Metabolic Panel:  Recent Labs Lab 09/17/16 1842 09/18/16 0345  NA 134* 138  K 3.4* 3.4*  CL 99* 104  CO2 26 27  GLUCOSE 164* 113*  BUN 18 15  CREATININE 0.72 0.60  CALCIUM 9.1 8.9   GFR: Estimated Creatinine Clearance: 80 mL/min (by C-G formula based on SCr of 0.6 mg/dL). Liver Function Tests: No results for input(s): AST, ALT, ALKPHOS, BILITOT, PROT, ALBUMIN in the last 168 hours. No results for input(s): LIPASE, AMYLASE in the last 168  hours. No results for input(s): AMMONIA in the last 168 hours. Coagulation Profile: No results for input(s): INR, PROTIME in the last 168 hours. Cardiac Enzymes:  Recent Labs Lab 09/17/16 1842  TROPONINI <0.03   BNP (last 3 results) No results for input(s): PROBNP in the last 8760 hours. HbA1C:  Recent Labs  09/18/16 0345  HGBA1C 5.4   CBG:  Recent Labs Lab 09/18/16 1121 09/18/16 1612 09/19/16 0727 09/20/16 0601  GLUCAP 105* 125* 85 100*   Lipid Profile:  Recent Labs  09/18/16 0345  CHOL 146  HDL 32*  LDLCALC 103*  TRIG 56  CHOLHDL 4.6   Thyroid Function Tests: No results for input(s): TSH, T4TOTAL, FREET4, T3FREE, THYROIDAB in the last 72 hours. Anemia Panel: No results for input(s): VITAMINB12, FOLATE, FERRITIN, TIBC, IRON, RETICCTPCT in the last 72 hours. Sepsis Labs:  Recent Labs Lab 09/17/16 2117  LATICACIDVEN 1.47    Recent Results (from the past 240 hour(s))  Blood culture (routine x 2)     Status: None (Preliminary result)   Collection Time: 09/17/16  9:10 PM  Result Value Ref Range Status   Specimen Description BLOOD RIGHT  ANTECUBITAL  Final   Special Requests   Final    BOTTLES DRAWN AEROBIC AND ANAEROBIC Blood Culture adequate volume   Culture NO GROWTH 2 DAYS  Final   Report Status PENDING  Incomplete  Blood culture (routine x 2)     Status: None (Preliminary result)   Collection Time: 09/17/16  9:15 PM  Result Value Ref Range Status   Specimen Description BLOOD RIGHT HAND  Final   Special Requests   Final    BOTTLES DRAWN AEROBIC AND ANAEROBIC Blood Culture adequate volume   Culture NO GROWTH 2 DAYS  Final   Report Status PENDING  Incomplete  MRSA PCR Screening     Status: None   Collection Time: 09/18/16 10:20 AM  Result Value Ref Range Status   MRSA by PCR NEGATIVE NEGATIVE Final    Comment:        The GeneXpert MRSA Assay (FDA approved for NASAL specimens only), is one component of a comprehensive MRSA  colonization surveillance program. It is not intended to diagnose MRSA infection nor to guide or monitor treatment for MRSA infections.          Radiology Studies: No results found.      Scheduled Meds: . anastrozole  1 mg Oral Daily  . B-complex with vitamin C  1 tablet Oral Daily  . cephALEXin  500 mg Oral Q8H  . cholecalciferol  1,000 Units Oral Daily  . docusate sodium  100 mg Oral BID  . enoxaparin (LOVENOX) injection  1 mg/kg Subcutaneous Q12H  . magnesium oxide  400 mg Oral Daily  . multivitamins with iron  1 tablet Oral Daily  . sodium chloride flush  3 mL Intravenous Q12H   Continuous Infusions:   LOS: 2 days    Time spent: 35 mins     Ankit Arsenio Loader, MD [facility] Pager [phone]   If 7PM-7AM, please contact night-coverage [email or web address] Password [email or web address] 09/20/2016, 11:22 AM

## 2016-09-20 NOTE — Progress Notes (Signed)
Patient with no complaints or concerns during 7pm - 7am shift. Slept over night. Stated that she feels good this morning.   Amerika Nourse, RN

## 2016-09-20 NOTE — Progress Notes (Signed)
PHARMACY - PHYSICIAN COMMUNICATION CRITICAL VALUE ALERT - BLOOD CULTURE IDENTIFICATION (BCID)  Results for orders placed or performed during the hospital encounter of 09/17/16  Blood Culture ID Panel (Reflexed) (Collected: 09/17/2016  9:10 PM)  Result Value Ref Range   Enterococcus species NOT DETECTED NOT DETECTED   Listeria monocytogenes NOT DETECTED NOT DETECTED   Staphylococcus species DETECTED (A) NOT DETECTED   Staphylococcus aureus NOT DETECTED NOT DETECTED   Methicillin resistance NOT DETECTED NOT DETECTED   Streptococcus species NOT DETECTED NOT DETECTED   Streptococcus agalactiae NOT DETECTED NOT DETECTED   Streptococcus pneumoniae NOT DETECTED NOT DETECTED   Streptococcus pyogenes NOT DETECTED NOT DETECTED   Acinetobacter baumannii NOT DETECTED NOT DETECTED   Enterobacteriaceae species NOT DETECTED NOT DETECTED   Enterobacter cloacae complex NOT DETECTED NOT DETECTED   Escherichia coli NOT DETECTED NOT DETECTED   Klebsiella oxytoca NOT DETECTED NOT DETECTED   Klebsiella pneumoniae NOT DETECTED NOT DETECTED   Proteus species NOT DETECTED NOT DETECTED   Serratia marcescens NOT DETECTED NOT DETECTED   Haemophilus influenzae NOT DETECTED NOT DETECTED   Neisseria meningitidis NOT DETECTED NOT DETECTED   Pseudomonas aeruginosa NOT DETECTED NOT DETECTED   Candida albicans NOT DETECTED NOT DETECTED   Candida glabrata NOT DETECTED NOT DETECTED   Candida krusei NOT DETECTED NOT DETECTED   Candida parapsilosis NOT DETECTED NOT DETECTED   Candida tropicalis NOT DETECTED NOT DETECTED    Name of physician (or Provider) Contacted: Amin  Changes to prescribed antibiotics required: no changes necessary  Rebecka Apley 09/20/2016  6:02 PM

## 2016-09-21 LAB — CBC
HCT: 21.2 % — ABNORMAL LOW (ref 36.0–46.0)
HEMOGLOBIN: 7 g/dL — AB (ref 12.0–15.0)
MCH: 30.7 pg (ref 26.0–34.0)
MCHC: 33 g/dL (ref 30.0–36.0)
MCV: 93 fL (ref 78.0–100.0)
PLATELETS: 141 10*3/uL — AB (ref 150–400)
RBC: 2.28 MIL/uL — AB (ref 3.87–5.11)
RDW: 17 % — ABNORMAL HIGH (ref 11.5–15.5)
WBC: 7 10*3/uL (ref 4.0–10.5)

## 2016-09-21 LAB — PREPARE RBC (CROSSMATCH)

## 2016-09-21 LAB — GLUCOSE, CAPILLARY: GLUCOSE-CAPILLARY: 96 mg/dL (ref 65–99)

## 2016-09-21 MED ORDER — PANTOPRAZOLE SODIUM 40 MG PO TBEC: 40.0000 mg | DELAYED_RELEASE_TABLET | Freq: Every day | ORAL | 0 refills | Status: AC | PRN

## 2016-09-21 MED ORDER — ENOXAPARIN SODIUM 100 MG/ML ~~LOC~~ SOLN: 1.0000 mg/kg | Freq: Two times a day (BID) | SUBCUTANEOUS | 0 refills | Status: DC

## 2016-09-21 MED ORDER — CEPHALEXIN 500 MG PO CAPS: 500.0000 mg | ORAL_CAPSULE | Freq: Three times a day (TID) | ORAL | 0 refills | Status: DC

## 2016-09-21 MED ORDER — SODIUM CHLORIDE 0.9 % IV SOLN
Freq: Once | INTRAVENOUS | Status: AC
  Administered 2016-09-21: 15:00:00 via INTRAVENOUS

## 2016-09-21 NOTE — Progress Notes (Signed)
Chart reviewed.  I have reviewed labs.  Noted that patient's hgb is 7.0.  Noted this is a drop from yesterday.  I have texted paged  Dr. Reesa Chew and informed of this information.  I will also report to on call nurse.

## 2016-09-21 NOTE — Progress Notes (Addendum)
PROGRESS NOTE    Linda [name]  SAY:[id number] DOB: 07-31-1945 DOA: 09/17/2016 PCP: No PCP Per Patient   Brief Narrative:  71 year old female with past medical history of breast cancer that is post bilateral mastectomy, chemoradiation and currently on chemotherapy, hypertension, GERD, peripheral vascular disease, diastolic CHF, pericardial effusion came to the ER with the complaints of left-sided arm swelling. At that time she also had some change in mental status therefore CT of the head was done which showed focal hypoattenuation within the left cerebellum concerning for possible acute ischemic stroke. Neurology was consulted and MRI was done which was negative for stroke or any metastatic disease in her brain. PT of the chest was also done which revealed pulmonary embolism with evidence of right-sided heart strain with concerns of submassive pulmonary embolism. She was started on heparin drip and then transitioned to Lovenox 1 mg-kilogram every 12 hours as needed.   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  -Echocardiogram done in March 2018 was relatively normal.  Anemia -Unknown etiology. No active signs of blood  loss. This could be from blood draws -We'll transfuse 2 units given her hemoglobin is at 7. No signs of active GI bleed or any other signs of bleeding at this time. She is currently on chemotherapy as an outpatient in next chemotherapy is on 10/09/2016. -She can be discharged after her transfusion today otherwise tomorrow.   Left upper extremity swelling/redness-questionable cellulitis versus lymphedema; improved -Dopplers have been negative -Keflex started on 09/18/2016-we'll continue for 5 days -Give her lymphedema sleeve  -Place arm on the pillow for elevation.  Concerns for stroke/encephalopathy; resolved  -CT of the head showed questionable acute ischemia therefore MRI of the brain was done which was negative for any acute pathology including any metastatic disease -EEG done-negative  Chronic diastolic congestive heart failure, compensated/stable -We will restart her Lasix and spironolactone tomorrow/at the time of discharge  Hypertension -Continue IV hydralazine as needed for now  GERD -Protonix as needed  Bilateral breast cancer metastasized to brain and lungs? -Status post bilateral mastectomy, radiation therapy and currently getting chemotherapy -Follows up with Dr. Jana Hakim. Has tentative appts on 4/16 and 10/09/16  Constipation -Bowel regimen as needed  Physical therapy recommended home PT due generalized deconditioning. It has been ordered.  DVT prophylaxis: Lovenox Code Status: Full Family Communication:  Family present at bedside Disposition Plan: Discharge today after tx if completed on time otherwise tomorrow.   Consultants:   Oncology  Procedures:   None  Antimicrobials:   Keflex 09/18/2016   Subjective: Patient denies any complaints.  Objective: Vitals:   09/20/16 0603 09/20/16 1810 09/20/16 1948 09/21/16 0603  BP: 133/74 (!) 138/40 132/82 (!) 138/52  Pulse: 89 88 88 77  Resp: 18  16 18   Temp:  97.8 F (36.6 C) 98.4 F (36.9 C) 98.2 F  (36.8 C) 98.3 F (36.8 C)  TempSrc: Oral Oral Oral Oral  SpO2: 97% 97% 100% 98%  Weight:    94.8 kg (209 lb)  Height:        Intake/Output Summary (Last 24 hours) at 09/21/16 1137 Last data filed at 09/21/16 0936  Gross per 24 hour  Intake              483 ml  Output              800 ml  Net             -317 ml   Filed Weights   09/19/16 1700 09/20/16 0547 09/21/16 0603  Weight: 94.1 kg (207 lb 7.3 oz) 94.1 kg (207 lb 8 oz) 94.8 kg (209 lb)    Examination:  General exam: Appears calm and comfortable  Respiratory system: Clear to auscultation. Respiratory effort normal. Cardiovascular system: S1 & S2 heard, RRR. No JVD, murmurs, rubs, gallops or clicks. No pedal edema. Gastrointestinal system: Abdomen is nondistended, soft and nontender. No organomegaly or masses felt. Normal bowel sounds heard. Central nervous system: Alert and oriented. No focal neurological deficits. Extremities: Symmetric 5 x 5 power.Nonpitting edema for left upper extremity with significant improvement of erythema. Skin: No rashes, lesions or ulcers Psychiatry: Judgement and insight appear normal. Mood & affect appropriate.     Data Reviewed:   CBC:  Recent Labs Lab 09/18/16 0345 09/19/16 0229 09/20/16 0315 09/20/16 2009 09/21/16 0457  WBC 8.5 8.3 6.3 7.0 7.0  HGB 10.0* 9.2* 7.4* 7.3* 7.0*  HCT 30.8* 27.3* 22.7* 22.4* 21.2*  MCV 93.9 93.8 93.4 92.9 93.0  PLT 80* 94* 114* 127* 662*   Basic Metabolic Panel:  Recent Labs Lab 09/17/16 1842 09/18/16 0345  NA 134* 138  K 3.4* 3.4*  CL 99* 104  CO2 26 27  GLUCOSE 164* 113*  BUN 18 15  CREATININE 0.72 0.60  CALCIUM 9.1 8.9   GFR: Estimated Creatinine Clearance: 80.2 mL/min (by C-G formula based on SCr of 0.6 mg/dL). Liver Function Tests: No results for input(s): AST, ALT, ALKPHOS, BILITOT, PROT, ALBUMIN in the last 168 hours. No results for input(s): LIPASE, AMYLASE in the last 168 hours. No results for input(s): AMMONIA in the last  168 hours. Coagulation Profile: No results for input(s): INR, PROTIME in the last 168 hours. Cardiac Enzymes:  Recent Labs Lab 09/17/16 1842  TROPONINI <0.03   BNP (last 3 results) No results for input(s): PROBNP in the last 8760 hours. HbA1C: No results for input(s): HGBA1C in the last 72 hours. CBG:  Recent Labs Lab 09/18/16 1121 09/18/16 1612 09/19/16 0727 09/20/16 0601 09/21/16 0602  GLUCAP 105* 125* 85 100* 96   Lipid Profile: No results for input(s): CHOL, HDL, LDLCALC, TRIG, CHOLHDL, LDLDIRECT in the last 72 hours. Thyroid Function Tests: No results for input(s): TSH, T4TOTAL, FREET4, T3FREE, THYROIDAB in the last 72 hours. Anemia Panel: No results for input(s): VITAMINB12, FOLATE, FERRITIN, TIBC, IRON, RETICCTPCT in the last 72 hours. Sepsis Labs:  Recent Labs Lab 09/17/16 2117  LATICACIDVEN 1.47    Recent Results (from the past 240 hour(s))  Blood culture (routine x 2)     Status: None (Preliminary result)   Collection Time: 09/17/16  9:10 PM  Result Value Ref Range Status   Specimen Description BLOOD RIGHT ANTECUBITAL  Final   Special Requests   Final    BOTTLES DRAWN AEROBIC AND  ANAEROBIC Blood Culture adequate volume   Culture  Setup Time   Final    GRAM POSITIVE COCCI IN CLUSTERS IN BOTH AEROBIC AND ANAEROBIC BOTTLES CRITICAL RESULT CALLED TO, READ BACK BY AND VERIFIED WITH: C BALL,PHARMD AT 1759 09/20/16 BY L BENFIELD    Culture   Final    GRAM POSITIVE COCCI CULTURE REINCUBATED FOR BETTER GROWTH    Report Status PENDING  Incomplete  Blood Culture ID Panel (Reflexed)     Status: Abnormal   Collection Time: 09/17/16  9:10 PM  Result Value Ref Range Status   Enterococcus species NOT DETECTED NOT DETECTED Final   Listeria monocytogenes NOT DETECTED NOT DETECTED Final   Staphylococcus species DETECTED (A) NOT DETECTED Final    Comment: Methicillin (oxacillin) susceptible coagulase negative staphylococcus. Possible blood culture contaminant (unless  isolated from more than one blood culture draw or clinical case suggests pathogenicity). No antibiotic treatment is indicated for blood  culture contaminants. CRITICAL RESULT CALLED TO, READ BACK BY AND VERIFIED WITH: C BALL,PHARMD AT 1759 09/20/16 BY L BENFIELD    Staphylococcus aureus NOT DETECTED NOT DETECTED Final   Methicillin resistance NOT DETECTED NOT DETECTED Final   Streptococcus species NOT DETECTED NOT DETECTED Final   Streptococcus agalactiae NOT DETECTED NOT DETECTED Final   Streptococcus pneumoniae NOT DETECTED NOT DETECTED Final   Streptococcus pyogenes NOT DETECTED NOT DETECTED Final   Acinetobacter baumannii NOT DETECTED NOT DETECTED Final   Enterobacteriaceae species NOT DETECTED NOT DETECTED Final   Enterobacter cloacae complex NOT DETECTED NOT DETECTED Final   Escherichia coli NOT DETECTED NOT DETECTED Final   Klebsiella oxytoca NOT DETECTED NOT DETECTED Final   Klebsiella pneumoniae NOT DETECTED NOT DETECTED Final   Proteus species NOT DETECTED NOT DETECTED Final   Serratia marcescens NOT DETECTED NOT DETECTED Final   Haemophilus influenzae NOT DETECTED NOT DETECTED Final   Neisseria meningitidis NOT DETECTED NOT DETECTED Final   Pseudomonas aeruginosa NOT DETECTED NOT DETECTED Final   Candida albicans NOT DETECTED NOT DETECTED Final   Candida glabrata NOT DETECTED NOT DETECTED Final   Candida krusei NOT DETECTED NOT DETECTED Final   Candida parapsilosis NOT DETECTED NOT DETECTED Final   Candida tropicalis NOT DETECTED NOT DETECTED Final  Blood culture (routine x 2)     Status: None (Preliminary result)   Collection Time: 09/17/16  9:15 PM  Result Value Ref Range Status   Specimen Description BLOOD RIGHT HAND  Final   Special Requests   Final    BOTTLES DRAWN AEROBIC AND ANAEROBIC Blood Culture adequate volume   Culture NO GROWTH 3 DAYS  Final   Report Status PENDING  Incomplete  MRSA PCR Screening     Status: None   Collection Time: 09/18/16 10:20 AM  Result  Value Ref Range Status   MRSA by PCR NEGATIVE NEGATIVE Final    Comment:        The GeneXpert MRSA Assay (FDA approved for NASAL specimens only), is one component of a comprehensive MRSA colonization surveillance program. It is not intended to diagnose MRSA infection nor to guide or monitor treatment for MRSA infections.          Radiology Studies: No results found.      Scheduled Meds: . sodium chloride   Intravenous Once  . anastrozole  1 mg Oral Daily  . B-complex with vitamin C  1 tablet Oral Daily  . cephALEXin  500 mg Oral Q8H  . cholecalciferol  1,000 Units Oral Daily  . docusate  sodium  100 mg Oral BID  . enoxaparin (LOVENOX) injection  1 mg/kg Subcutaneous Q12H  . magnesium oxide  400 mg Oral Daily  . multivitamins with iron  1 tablet Oral Daily  . sodium chloride flush  3 mL Intravenous Q12H   Continuous Infusions:   LOS: 3 days    Time spent: 35 mins     Avina Eberle Arsenio Loader, MD [facility] Pager [phone]   If 7PM-7AM, please contact night-coverage [email or web address] Password [email or web address] 09/21/2016, 11:37 AM

## 2016-09-21 NOTE — Care Management Note (Signed)
Case Management Note  Patient Details  Name: Linda [name] MRN: [id number] Date of Birth: January 11, 1946  Subjective/Objective:                  left-sided arm swelling Action/Plan: Discharge planning Expected Discharge Date:  09/21/16               Expected Discharge Plan:  [place]  In-House Referral:  NA  Discharge planning Services  CM Consult  Post Acute Care Choice:  Home Health Choice offered to:  Patient  DME Arranged:  3-N-1 DME Agency:  [place]:  [facility] [place] Agency:  [place]  Status of Service:  Completed, signed off  If discussed at [place] of Stay Meetings, dates discussed:    Additional Comments: CM met with pt in room who states her son is providing transportation home. Cm offered choice of home health agency and pt chooses [facility] to render HHPT. Referral called to [facility] rep, Jermaine. CM notified [facility] rep, Reggie to please deliver the 3n1 to room prior to discharge. No other CM needs were communicated. Dellie Catholic, RN 09/21/2016, 12:11 PM

## 2016-09-21 NOTE — Progress Notes (Signed)
Patient acts like she is confused, leaning to the left side in bed, also needed to use walker when getting up to the bathroom. I explained my suspicions to day shift nurse, but nurse didn't seem alarmed. I asked the tech that worked with her previously and tech states this is all new for her. Will continue to  Monitor.

## 2016-09-21 NOTE — Progress Notes (Signed)
Rapid nurse and charge nurse assessed patient due to concerns of altered mental status. Patient did not cooperate well with [facility] stroke assessment with rapid nurse. MD notified.

## 2016-09-21 NOTE — Discharge Summary (Deleted)
Physician Discharge Summary  Linda [name] IEP:[id number] DOB: 07-May-1946 DOA: 09/17/2016  PCP: No PCP Per Patient  Admit date: 09/17/2016 Discharge date: 09/21/2016  Admitted From: Home Disposition:  Home  Recommendations for Outpatient Follow-up:  1. Follow up with PCP in 1-2 weeks 2. Follow up with Dr Jana Hakim on 09/29/16 3. Take Lovenox as prescribed subcutaneously twice daily for at least 3-6 months. Follow-up with oncology will further determine the length. 4. Take Keflex for left upper extremity cellulitis as prescribed  Home Health: None Equipment/Devices: None  Discharge Condition: Stable CODE STATUS: Full Diet recommendation: Heart Healthy / Carb Modified  Brief/Interim Summary: 70 year old female with past medical history of breast cancer status post bilateral mastectomy, radiation and currently undergoing chemotherapy, hypertension, GERD, peripheral vascular disease, diastolic CHF, pericardial effusion came to the ER with the complaints of left-sided arm swelling. She also had some change in mental status at first therefore CT of the head was done which showed focal attenuation concerning for acute ischemic stroke therefore neurology was consulted. MRI of the brain was performed which was negative for stroke or any other metastatic disease. CAT scan of the chest was done which revealed pulmonary embolism with evidence of right-sided heart strain with concerns of submassive pulmonary embolism therefore she was started on heparin drip. Due to history of for malignancy heparin was switched to Lovenox 1 mg/kilogram every 12 hours to be taken for at least 3-6 months maybe even longer according to oncology discretion. Dr.Magrinat had seen the patient as well agreed with the choice of anticoagulation will be happy to follow her up outpatient on 09/29/2016. During initial eval she also had left upper extremity swelling and redness concerning for cellulitis versus deep vein thromboses  therefore Dopplers were done which were negative. She was started on Keflex which significantly improved her erythema and swelling reduced. She was also given lymphedema sleeve given history of mastectomy. Neurologically she remained intact and there was no further deterioration in terms of that. During her stay there was also noted that her blood hemoglobin had dropped from around baseline of 9-10 to low 7.0 therefore she was given 2 units of blood. No active signs of bleeding was noted. Her next chemotherapy is on 10/09/2016. At this time she has reached maximum benefit from inpatient hospital stay and can be discharged with outpatient follow-up with oncology, Dr Jana Hakim on 09/29/2016. Prior to seeing him I advised her to get CBC done towards the end of this week around 09/25/2016 to ensure hemoglobin remained stable. Rest of the recommendations as stated above. She is medically cleared to be discharged with outpatient follow-up.  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  -Echocardiogram done in March 2018 was relatively normal.  Anemia -Unknown etiology. No active signs of blood loss. This could be  from blood draws -We'll  transfuse 2 units given her hemoglobin is at 7. No signs of active GI bleed or any other signs of bleeding at this time. She is currently on chemotherapy as an outpatient in next chemotherapy is on 10/09/2016. -She can be discharged after her transfusion today and get repeat lab work later on this week and follow-up with oncology on 09/29/2016. Sooner if necessary -Supplemental iron can be started outpatient.  Left upper extremity swelling/redness-questionable cellulitis versus lymphedema; improved -Dopplers have been negative -Keflex started on 09/18/2016-we'll continue for 5 days -Give her lymphedema sleeve  -Place arm on the pillow for elevation.  Concerns for stroke/encephalopathy; resolved  -CT of the head showed questionable acute ischemia therefore MRI of the brain was done which was negative for any acute pathology including any metastatic disease -EEG done-negative  Chronic diastolic congestive heart failure, compensated/stable -We will restart her Lasix and spironolactone tomorrow/at the time of discharge  Hypertension -Continue IV hydralazine as needed for now  GERD -Protonix as needed  Bilateral breast cancer metastasized to brain and lungs? -Status post bilateral mastectomy, radiation therapy and currently getting chemotherapy -Follows up with Dr. Jana Hakim. Has tentative appts on 4/16 and 10/09/16  Constipation -Bowel regimen as needed. She prefers taking over-the-counter laxative after getting discharged.  Discharge Instructions   Allergies as of 09/21/2016   No Known Allergies     Medication List    TAKE these medications   anastrozole 1 MG tablet Commonly known as:  ARIMIDEX Take 1 tablet (1 mg total) by mouth daily.   b complex vitamins tablet Take 1 tablet by mouth daily.   cephALEXin 500 MG capsule Commonly known as:  KEFLEX Take 1 capsule (500 mg total) by mouth every 8 (eight) hours.   cholecalciferol 1000 units tablet Commonly known as:   VITAMIN D Take 1 tablet (1,000 Units total) by mouth daily.   docusate sodium 100 MG capsule Commonly known as:  COLACE Take 1 capsule (100 mg total) by mouth 2 (two) times daily.   enoxaparin 100 MG/ML injection Commonly known as:  LOVENOX Inject 0.95 mLs (95 mg total) into the skin every 12 (twelve) hours.   furosemide 20 MG tablet Commonly known as:  LASIX Take 1 tablet (20 mg total) by mouth daily as needed. What changed:  reasons to take this   magnesium oxide 400 (241.3 Mg) MG tablet Commonly known as:  MAG-OX Take 1 tablet (400 mg total) by mouth daily.   multivitamins with iron Tabs tablet Take 1 tablet by mouth daily.   pantoprazole 40 MG tablet Commonly known as:  PROTONIX Take 1 tablet (40 mg total) by mouth daily as needed.   spironolactone 25 MG tablet Commonly known as:  ALDACTONE Take 0.5 tablets (12.5 mg total) by mouth daily.      Follow-up Information    Chauncey Cruel, MD. Go on 09/29/2016.   Specialty:  Oncology Contact information: [address] [place] [address] [phone]          No Known Allergies  Consultations:  Oncology   Procedures/Studies: Dg Chest 2 View  Result Date: 09/17/2016 CLINICAL DATA:  Syncopal episode last night, BILATERAL breast cancer undergoing treatment, former smoker, hypertension EXAM: CHEST  2 VIEW COMPARISON:  C7 2017 FINDINGS: RIGHT jugular Port-A-Cath with tip projecting over SVC. Minimal enlargement of cardiac silhouette. Atherosclerotic calcification aorta. Mediastinal contours and pulmonary vascularity normal. Lungs clear. No infiltrate, pleural effusion or pneumothorax. Prior BILATERAL mastectomy and LEFT axillary node dissection. Bones  demineralized. IMPRESSION: No acute abnormalities. Mild enlargement cardiac silhouette. Aortic atherosclerosis. Electronically Signed   By: Lavonia Dana M.D.   On: 09/17/2016 19:37   Ct Head Wo Contrast  Addendum Date: 09/17/2016   ADDENDUM REPORT: 09/17/2016  22:44 ADDENDUM: Critical Value/emergent results were called by telephone at the time of interpretation on 09/17/2016 at 10:44 pm to Dr. Duffy Bruce , who verbally acknowledged these results. Electronically Signed   By: Ulyses Jarred M.D.   On: 09/17/2016 22:44   Result Date: 09/17/2016 CLINICAL DATA:  Dizziness and stage IV breast cancer EXAM: CT HEAD WITHOUT CONTRAST TECHNIQUE: Contiguous axial images were obtained from the base of the skull through the vertex without intravenous contrast. COMPARISON:  Brain MRI 05/19/2016 FINDINGS: Brain: There is focal hypoattenuation within the left cerebellar hemisphere. No intracranial hemorrhage. No midline shift or other mass effect. There is periventricular hypoattenuation compatible with chronic microvascular disease. Vascular: No hyperdense vessel or unexpected calcification. Skull: Normal visualized skull base, calvarium and extracranial soft tissues. Sinuses/Orbits: The left mastoid air cells are opacified. The paranasal sinuses are clear. Normal orbits. IMPRESSION: 1. Focal hypoattenuation within the left cerebellum is concerning for acute ischemia, particularly in the context of reported dizziness. No associated hemorrhage or mass effect. MRI may be considered for confirmation. 2. Confluent white matter hypoattenuation may be secondary to chronic microvascular ischemia or a post treatment effect. Electronically Signed: By: Ulyses Jarred M.D. On: 09/17/2016 22:39   Ct Angio Chest Pe W Or Wo Contrast  Result Date: 09/17/2016 CLINICAL DATA:  Left arm erythema and warmth starting last evening with dizziness onset last night. Stage IV breast cancer. EXAM: CT ANGIOGRAPHY CHEST WITH CONTRAST TECHNIQUE: Multidetector CT imaging of the chest was performed using the standard protocol during bolus administration of intravenous contrast. Multiplanar CT image reconstructions and MIPs were obtained to evaluate the vascular anatomy. CONTRAST:  100 cc Isovue 370 IV COMPARISON:   CT 05/06/2016 FINDINGS: Cardiovascular: Web-like filling defects in the segmental and subsegmental arteries to the lower lobes, best seen series 5, image 59 within the right lower lobe and series 5, image 52 in the left lower lobe. RV/LV ratio = 1.2. The heart is borderline enlarged without pericardial effusion. 4 cm ascending aortic aneurysm without dissection. There is aortic atherosclerosis and coronary arteriosclerosis. Mediastinum/Nodes: Small hiatal hernia. No mediastinal lymphadenopathy. Lungs/Pleura: Dependent atelectasis both lungs with scarring in the left upper lobe possibly radiation induced. No dominant mass within the lungs identified. No pulmonary consolidation or pneumothorax. Upper Abdomen: No acute abnormality. Musculoskeletal: Port catheter projects over the anterior right chest wall with tip in the right atrium. Multifocal osteoblastic metastasis along the dorsal spine. Bilateral mastectomy changes. Left axillary lymph node dissection. Review of the MIP images confirms the above findings. IMPRESSION: 1. Positive for acute PE with CT evidence of right heart strain (RV/LV Ratio = 1.2) consistent with at least submassive (intermediate risk) PE. The presence of right heart strain has been associated with an increased risk of morbidity and mortality. Please activate Code PE by paging [phone]. Tiny web-like nonocclusive filling defects are seen to both lower lobes. Critical Value/emergent results were called by telephone at the time of interpretation on 09/17/2016 at 10:49 pm to the ED secretary with message to be relayed to Dr. Duffy Bruce. Direct telephone line left in case of any questions from this report. 2. Status post bilateral mastectomy with left upper lobe scarring and dependent atelectasis. 3. 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 Electronically  Signed   By: Ashley Royalty M.D.   On: 09/17/2016 22:51   Mr Jeri Cos XT Contrast  Result  Date: 09/18/2016 CLINICAL DATA:  Unsteady gait. Confusion. Personal history of metastatic breast cancer. Abnormal head CT. EXAM: MRI HEAD WITHOUT AND WITH CONTRAST TECHNIQUE: Multiplanar, multiecho pulse sequences of the brain and surrounding structures were obtained without and with intravenous contrast. CONTRAST:  20 cc MultiHance COMPARISON:  CT 09/17/2016. MRI 05/19/2016. Multiple older studies as distant as 09/25/2014 FINDINGS: Brain: Diffusion imaging does not show any acute or subacute infarction. No abnormality is seen affecting the brainstem or cerebellum. Cerebral hemispheres show chronic small-vessel ischemic changes of the white matter, related to previous radiation. No sign of mass lesion, hemorrhage, hydrocephalus or extra-axial collection. Vascular: Major vessels at the base of the brain show flow. Skull and upper cervical spine: Negative. No residual or recurrent enhancing bone lesions. Sinuses/Orbits: Paranasal sinuses are clear. Orbits are negative. There are bilateral mastoid effusions, minimal on the right and quite extensive on the left. This could relate to symptoms. Other: None IMPRESSION: No evidence of acute or subacute infarction. Specifically, the cerebellum is normal. No evidence of metastatic disease. Chronic white matter changes related to previous whole-brain radiation. Mastoid effusions much more extensive on the left than the right. This could be symptomatic. These are chronic however. Electronically Signed   By: Nelson Chimes M.D.   On: 09/18/2016 09:41       Subjective:   Discharge Exam: Vitals:   09/20/16 1948 09/21/16 0603  BP: 132/82 (!) 138/52  Pulse: 88 77  Resp: 16 18  Temp: 98.2 F (36.8 C) 98.3 F (36.8 C)   Vitals:   09/20/16 0603 09/20/16 1810 09/20/16 1948 09/21/16 0603  BP: 133/74 (!) 138/40 132/82 (!) 138/52  Pulse: 89 88 88 77  Resp: 18  16 18   Temp: 97.8 F (36.6 C) 98.4 F (36.9 C) 98.2 F (36.8 C) 98.3 F (36.8 C)  TempSrc: Oral Oral Oral  Oral  SpO2: 97% 97% 100% 98%  Weight:    94.8 kg (209 lb)  Height:        General: Pt is alert, awake, not in acute distress Cardiovascular: RRR, S1/S2 +, no rubs, no gallops Respiratory: CTA bilaterally, no wheezing, no rhonchi Abdominal: Soft, NT, ND, bowel sounds + Extremities: no edema, no cyanosis    The results of significant diagnostics from this hospitalization (including imaging, microbiology, ancillary and laboratory) are listed below for reference.     Microbiology: Recent Results (from the past 240 hour(s))  Blood culture (routine x 2)     Status: None (Preliminary result)   Collection Time: 09/17/16  9:10 PM  Result Value Ref Range Status   Specimen Description BLOOD RIGHT ANTECUBITAL  Final   Special Requests   Final    BOTTLES DRAWN AEROBIC AND ANAEROBIC Blood Culture adequate volume   Culture  Setup Time   Final    GRAM POSITIVE COCCI IN CLUSTERS IN BOTH AEROBIC AND ANAEROBIC BOTTLES CRITICAL RESULT CALLED TO, READ BACK BY AND VERIFIED WITH: C BALL,PHARMD AT 1759 09/20/16 BY L BENFIELD    Culture   Final    GRAM POSITIVE COCCI CULTURE REINCUBATED FOR BETTER GROWTH    Report Status PENDING  Incomplete  Blood Culture ID Panel (Reflexed)     Status: Abnormal   Collection Time: 09/17/16  9:10 PM  Result Value Ref Range Status   Enterococcus species NOT DETECTED NOT DETECTED Final   Listeria monocytogenes NOT DETECTED  NOT DETECTED Final   Staphylococcus species DETECTED (A) NOT DETECTED Final    Comment: Methicillin (oxacillin) susceptible coagulase negative staphylococcus. Possible blood culture contaminant (unless isolated from more than one blood culture draw or clinical case suggests pathogenicity). No antibiotic treatment is indicated for blood  culture contaminants. CRITICAL RESULT CALLED TO, READ BACK BY AND VERIFIED WITH: C BALL,PHARMD AT 1759 09/20/16 BY L BENFIELD    Staphylococcus aureus NOT DETECTED NOT DETECTED Final   Methicillin resistance NOT  DETECTED NOT DETECTED Final   Streptococcus species NOT DETECTED NOT DETECTED Final   Streptococcus agalactiae NOT DETECTED NOT DETECTED Final   Streptococcus pneumoniae NOT DETECTED NOT DETECTED Final   Streptococcus pyogenes NOT DETECTED NOT DETECTED Final   Acinetobacter baumannii NOT DETECTED NOT DETECTED Final   Enterobacteriaceae species NOT DETECTED NOT DETECTED Final   Enterobacter cloacae complex NOT DETECTED NOT DETECTED Final   Escherichia coli NOT DETECTED NOT DETECTED Final   Klebsiella oxytoca NOT DETECTED NOT DETECTED Final   Klebsiella pneumoniae NOT DETECTED NOT DETECTED Final   Proteus species NOT DETECTED NOT DETECTED Final   Serratia marcescens NOT DETECTED NOT DETECTED Final   Haemophilus influenzae NOT DETECTED NOT DETECTED Final   Neisseria meningitidis NOT DETECTED NOT DETECTED Final   Pseudomonas aeruginosa NOT DETECTED NOT DETECTED Final   Candida albicans NOT DETECTED NOT DETECTED Final   Candida glabrata NOT DETECTED NOT DETECTED Final   Candida krusei NOT DETECTED NOT DETECTED Final   Candida parapsilosis NOT DETECTED NOT DETECTED Final   Candida tropicalis NOT DETECTED NOT DETECTED Final  Blood culture (routine x 2)     Status: None (Preliminary result)   Collection Time: 09/17/16  9:15 PM  Result Value Ref Range Status   Specimen Description BLOOD RIGHT HAND  Final   Special Requests   Final    BOTTLES DRAWN AEROBIC AND ANAEROBIC Blood Culture adequate volume   Culture NO GROWTH 3 DAYS  Final   Report Status PENDING  Incomplete  MRSA PCR Screening     Status: None   Collection Time: 09/18/16 10:20 AM  Result Value Ref Range Status   MRSA by PCR NEGATIVE NEGATIVE Final    Comment:        The GeneXpert MRSA Assay (FDA approved for NASAL specimens only), is one component of a comprehensive MRSA colonization surveillance program. It is not intended to diagnose MRSA infection nor to guide or monitor treatment for MRSA infections.      Labs: BNP  (last 3 results)  Recent Labs  09/17/16 2349  BNP 154.0*   Basic Metabolic Panel:  Recent Labs Lab 09/17/16 1842 09/18/16 0345  NA 134* 138  K 3.4* 3.4*  CL 99* 104  CO2 26 27  GLUCOSE 164* 113*  BUN 18 15  CREATININE 0.72 0.60  CALCIUM 9.1 8.9   Liver Function Tests: No results for input(s): AST, ALT, ALKPHOS, BILITOT, PROT, ALBUMIN in the last 168 hours. No results for input(s): LIPASE, AMYLASE in the last 168 hours. No results for input(s): AMMONIA in the last 168 hours. CBC:  Recent Labs Lab 09/18/16 0345 09/19/16 0229 09/20/16 0315 09/20/16 2009 09/21/16 0457  WBC 8.5 8.3 6.3 7.0 7.0  HGB 10.0* 9.2* 7.4* 7.3* 7.0*  HCT 30.8* 27.3* 22.7* 22.4* 21.2*  MCV 93.9 93.8 93.4 92.9 93.0  PLT 80* 94* 114* 127* 141*   Cardiac Enzymes:  Recent Labs Lab 09/17/16 1842  TROPONINI <0.03   BNP: Invalid input(s): POCBNP CBG:  Recent Labs Lab  09/18/16 1121 09/18/16 1612 09/19/16 0727 09/20/16 0601 09/21/16 0602  GLUCAP 105* 125* 85 100* 96   D-Dimer No results for input(s): DDIMER in the last 72 hours. Hgb A1c No results for input(s): HGBA1C in the last 72 hours. Lipid Profile No results for input(s): CHOL, HDL, LDLCALC, TRIG, CHOLHDL, LDLDIRECT in the last 72 hours. Thyroid function studies No results for input(s): TSH, T4TOTAL, T3FREE, THYROIDAB in the last 72 hours.  Invalid input(s): FREET3 Anemia work up No results for input(s): VITAMINB12, FOLATE, FERRITIN, TIBC, IRON, RETICCTPCT in the last 72 hours. Urinalysis    Component Value Date/Time   COLORURINE AMBER (A) 09/10/2014 1607   APPEARANCEUR TURBID (A) 09/10/2014 1607   LABSPEC 1.023 09/10/2014 1607   PHURINE 5.5 09/10/2014 1607   GLUCOSEU NEGATIVE 09/10/2014 1607   HGBUR LARGE (A) 09/10/2014 1607   BILIRUBINUR SMALL (A) 09/10/2014 1607   KETONESUR NEGATIVE 09/10/2014 1607   PROTEINUR 100 (A) 09/10/2014 1607   UROBILINOGEN 1.0 09/10/2014 1607   NITRITE NEGATIVE 09/10/2014 1607    LEUKOCYTESUR LARGE (A) 09/10/2014 1607   Sepsis Labs Invalid input(s): PROCALCITONIN,  WBC,  LACTICIDVEN Microbiology Recent Results (from the past 240 hour(s))  Blood culture (routine x 2)     Status: None (Preliminary result)   Collection Time: 09/17/16  9:10 PM  Result Value Ref Range Status   Specimen Description BLOOD RIGHT ANTECUBITAL  Final   Special Requests   Final    BOTTLES DRAWN AEROBIC AND ANAEROBIC Blood Culture adequate volume   Culture  Setup Time   Final    GRAM POSITIVE COCCI IN CLUSTERS IN BOTH AEROBIC AND ANAEROBIC BOTTLES CRITICAL RESULT CALLED TO, READ BACK BY AND VERIFIED WITH: C BALL,PHARMD AT 1759 09/20/16 BY L BENFIELD    Culture   Final    GRAM POSITIVE COCCI CULTURE REINCUBATED FOR BETTER GROWTH    Report Status PENDING  Incomplete  Blood Culture ID Panel (Reflexed)     Status: Abnormal   Collection Time: 09/17/16  9:10 PM  Result Value Ref Range Status   Enterococcus species NOT DETECTED NOT DETECTED Final   Listeria monocytogenes NOT DETECTED NOT DETECTED Final   Staphylococcus species DETECTED (A) NOT DETECTED Final    Comment: Methicillin (oxacillin) susceptible coagulase negative staphylococcus. Possible blood culture contaminant (unless isolated from more than one blood culture draw or clinical case suggests pathogenicity). No antibiotic treatment is indicated for blood  culture contaminants. CRITICAL RESULT CALLED TO, READ BACK BY AND VERIFIED WITH: C BALL,PHARMD AT 1759 09/20/16 BY L BENFIELD    Staphylococcus aureus NOT DETECTED NOT DETECTED Final   Methicillin resistance NOT DETECTED NOT DETECTED Final   Streptococcus species NOT DETECTED NOT DETECTED Final   Streptococcus agalactiae NOT DETECTED NOT DETECTED Final   Streptococcus pneumoniae NOT DETECTED NOT DETECTED Final   Streptococcus pyogenes NOT DETECTED NOT DETECTED Final   Acinetobacter baumannii NOT DETECTED NOT DETECTED Final   Enterobacteriaceae species NOT DETECTED NOT DETECTED  Final   Enterobacter cloacae complex NOT DETECTED NOT DETECTED Final   Escherichia coli NOT DETECTED NOT DETECTED Final   Klebsiella oxytoca NOT DETECTED NOT DETECTED Final   Klebsiella pneumoniae NOT DETECTED NOT DETECTED Final   Proteus species NOT DETECTED NOT DETECTED Final   Serratia marcescens NOT DETECTED NOT DETECTED Final   Haemophilus influenzae NOT DETECTED NOT DETECTED Final   Neisseria meningitidis NOT DETECTED NOT DETECTED Final   Pseudomonas aeruginosa NOT DETECTED NOT DETECTED Final   Candida albicans NOT DETECTED NOT DETECTED Final  Candida glabrata NOT DETECTED NOT DETECTED Final   Candida krusei NOT DETECTED NOT DETECTED Final   Candida parapsilosis NOT DETECTED NOT DETECTED Final   Candida tropicalis NOT DETECTED NOT DETECTED Final  Blood culture (routine x 2)     Status: None (Preliminary result)   Collection Time: 09/17/16  9:15 PM  Result Value Ref Range Status   Specimen Description BLOOD RIGHT HAND  Final   Special Requests   Final    BOTTLES DRAWN AEROBIC AND ANAEROBIC Blood Culture adequate volume   Culture NO GROWTH 3 DAYS  Final   Report Status PENDING  Incomplete  MRSA PCR Screening     Status: None   Collection Time: 09/18/16 10:20 AM  Result Value Ref Range Status   MRSA by PCR NEGATIVE NEGATIVE Final    Comment:        The GeneXpert MRSA Assay (FDA approved for NASAL specimens only), is one component of a comprehensive MRSA colonization surveillance program. It is not intended to diagnose MRSA infection nor to guide or monitor treatment for MRSA infections.      Time coordinating discharge: Over 30 minutes  SIGNED:   Damita Lack, MD  [facility] 09/21/2016, 11:42 AM Pager   If 7PM-7AM, please contact night-coverage [email or web address] Password [email or web address]

## 2016-09-22 ENCOUNTER — Inpatient Hospital Stay (HOSPITAL_COMMUNITY): Payer: Medicare Other

## 2016-09-22 LAB — IRON AND TIBC
Iron: 29 ug/dL (ref 28–170)
Saturation Ratios: 10 % — ABNORMAL LOW (ref 10.4–31.8)
TIBC: 280 ug/dL (ref 250–450)
UIBC: 251 ug/dL

## 2016-09-22 LAB — GLUCOSE, CAPILLARY
GLUCOSE-CAPILLARY: 96 mg/dL (ref 65–99)
GLUCOSE-CAPILLARY: 103 mg/dL — AB (ref 65–99)
Glucose-Capillary: 91 mg/dL (ref 65–99)
Glucose-Capillary: 103 mg/dL — ABNORMAL HIGH (ref 65–99)
Glucose-Capillary: 121 mg/dL — ABNORMAL HIGH (ref 65–99)

## 2016-09-22 LAB — CBC
HEMATOCRIT: 27.3 % — AB (ref 36.0–46.0)
Hemoglobin: 9 g/dL — ABNORMAL LOW (ref 12.0–15.0)
MCH: 29.7 pg (ref 26.0–34.0)
MCHC: 33 g/dL (ref 30.0–36.0)
MCV: 90.1 fL (ref 78.0–100.0)
Platelets: 165 10*3/uL (ref 150–400)
RBC: 3.03 MIL/uL — ABNORMAL LOW (ref 3.87–5.11)
RDW: 18 % — AB (ref 11.5–15.5)
WBC: 8.9 10*3/uL (ref 4.0–10.5)

## 2016-09-22 LAB — BLOOD GAS, ARTERIAL
ACID-BASE EXCESS: 4 mmol/L — AB (ref 0.0–2.0)
BICARBONATE: 27.2 mmol/L (ref 20.0–28.0)
Drawn by: 301361
FIO2: 21
O2 SAT: 96.2 %
PO2 ART: 80.5 mmHg — AB (ref 83.0–108.0)
Patient temperature: 99.4
pCO2 arterial: 36.3 mmHg (ref 32.0–48.0)
pH, Arterial: 7.49 — ABNORMAL HIGH (ref 7.350–7.450)

## 2016-09-22 LAB — RETICULOCYTES
RBC.: 2.96 MIL/uL — ABNORMAL LOW (ref 3.87–5.11)
RETIC COUNT ABSOLUTE: 77 10*3/uL (ref 19.0–186.0)
RETIC CT PCT: 2.6 % (ref 0.4–3.1)

## 2016-09-22 LAB — TYPE AND SCREEN
ABO/RH(D): O POS
ANTIBODY SCREEN: NEGATIVE
UNIT DIVISION: 0
Unit division: 0

## 2016-09-22 LAB — BPAM RBC
BLOOD PRODUCT EXPIRATION DATE: 201805042359
Blood Product Expiration Date: 201805042359
ISSUE DATE / TIME: 201804081449
ISSUE DATE / TIME: 201804081834
UNIT TYPE AND RH: 5100
Unit Type and Rh: 5100

## 2016-09-22 LAB — URINALYSIS, ROUTINE W REFLEX MICROSCOPIC
Bilirubin Urine: NEGATIVE
GLUCOSE, UA: NEGATIVE mg/dL
Hgb urine dipstick: NEGATIVE
KETONES UR: NEGATIVE mg/dL
LEUKOCYTES UA: NEGATIVE
Nitrite: NEGATIVE
PROTEIN: NEGATIVE mg/dL
Specific Gravity, Urine: 1.011 (ref 1.005–1.030)
pH: 6 (ref 5.0–8.0)

## 2016-09-22 LAB — CULTURE, BLOOD (ROUTINE X 2): Special Requests: ADEQUATE

## 2016-09-22 LAB — FOLATE: Folate: 38.3 ng/mL (ref >5.9)

## 2016-09-22 LAB — HEPATIC FUNCTION PANEL
ALT: 38 U/L (ref 14–54)
AST: 67 U/L — ABNORMAL HIGH (ref 15–41)
Albumin: 2.6 g/dL — ABNORMAL LOW (ref 3.5–5.0)
Alkaline Phosphatase: 108 U/L (ref 38–126)
BILIRUBIN DIRECT: 0.3 mg/dL (ref 0.1–0.5)
BILIRUBIN INDIRECT: 1.2 mg/dL — AB (ref 0.3–0.9)
Total Bilirubin: 1.5 mg/dL — ABNORMAL HIGH (ref 0.3–1.2)
Total Protein: 6.5 g/dL (ref 6.5–8.1)

## 2016-09-22 LAB — AMMONIA: AMMONIA: 18 umol/L (ref 9–35)

## 2016-09-22 LAB — VITAMIN B12: Vitamin B-12: 2204 pg/mL — ABNORMAL HIGH (ref 180–914)

## 2016-09-22 LAB — FERRITIN: FERRITIN: 804 ng/mL — AB (ref 11–307)

## 2016-09-22 MED ORDER — ALBUTEROL SULFATE (2.5 MG/3ML) 0.083% IN NEBU: 2.5000 mg | INHALATION_SOLUTION | RESPIRATORY_TRACT | Status: DC | PRN

## 2016-09-22 MED ORDER — GADOBENATE DIMEGLUMINE 529 MG/ML IV SOLN
20.0000 mL | Freq: Once | INTRAVENOUS | Status: AC
  Administered 2016-09-22: 17 mL via INTRAVENOUS

## 2016-09-22 MED ORDER — TRAMADOL HCL 50 MG PO TABS
50.0000 mg | ORAL_TABLET | Freq: Two times a day (BID) | ORAL | Status: DC | PRN
  Administered 2016-09-22 – 2016-09-25 (×2): 50 mg via ORAL
  Filled 2016-09-22 (×2): qty 1

## 2016-09-22 MED ORDER — ENOXAPARIN SODIUM 100 MG/ML ~~LOC~~ SOLN: 85.0000 mg | Freq: Two times a day (BID) | SUBCUTANEOUS | Status: DC

## 2016-09-22 NOTE — Progress Notes (Signed)
  [place] TEAM 1 - Stepdown/ICU TEAM  STAT CT obtained this morning suggested enlarged ventricles w/o clear etiology.  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  Pt's mental status has not improved.   I called and discussed findings w/ Dr. Christella Noa who is to see the pt.  Pt is being moved to Neuro ICU in anticipation of needing a ventriculostomy.  I called the number listed for her son Shanon Brow but was not able to speak with him. I will try again later.    Despite her know PEs, I have had to stop her anticoag.  She does not have LE DVTs, therefore IVC filter is not presently indicated.  I would like to resume anticoag as soon as possible, however.  Her RN tells me her last dose was last night.    Cherene Altes, MD [facility] Office  [phone] Pager - Text Page per Amion as per below:  On-Call/Text Page:      [email or web address]      password [email or web address]  If 7PM-7AM, please contact night-coverage [email or web address] Password [email or web address] 09/22/2016, 2:16 PM

## 2016-09-22 NOTE — Significant Event (Signed)
Rapid Response Event Note  Overview:While on floor rounding, Charge and bedside RNs concerned with patients progressive confusion.  Per NT, pt was able to get OOB and go to bathroom with little assistance and was more alert last time she worked(Friday).  Tonight, pt needed walker and more assistance to get to bathroom and was more confused.  Time Called: 2345 Arrival Time: 2345 Event Type: Neurologic  Initial Focused Assessment: Pt leaning to left side of bed with eyes closed. Pt will follow simple commands only . Unable to complete NIH d/t patients inattention and only following simple commands.  Pt visibly weaker on left side but does move all extremities, pupils equal and reactive.  Skin warm and dry.    Interventions: Advised RN to notify MD of changes and await any orders.   Plan of Care (if not transferred): Monitor pt, call RRT if assistance needed. Event Summary: Schorr, NP  at 2345    at    Outcome: Stayed in room and stabalized  Event End Time: 2355  [place]

## 2016-09-22 NOTE — Progress Notes (Signed)
Patient seems sleepy, but is not responding to questions and commands as she did earlier in the night. Paged hospitalist again, awaiting orders.

## 2016-09-22 NOTE — Progress Notes (Signed)
Pt returned from MRI. Pt still only responsive to painful stimuli. When asked questions, pt still repeats her birthday for every answer. Grips weak bilaterally.

## 2016-09-22 NOTE — Consult Note (Signed)
Reason for Consult:acute hydrocephalus, decreased LOC Referring Physician: Lindyn, Vossler is an 71 y.o. female.  HPI: whom was admitted for submassive PE, with right heart strain. Started on heparin, known LUE swelling. She was described this morning as non responsive. Repeat cranial imaging revealed acute hydrocephalus. She was moved to the ICU per my recommendation, but is now alert and following commands.  Past Medical History:  Diagnosis Date  . Acute on chronic diastolic CHF (congestive heart failure) ([place])   . Acute pulmonary embolism ([place]) 12/13/2014  . Allergy   . Blood transfusion without reported diagnosis   . Breast cancer ([place]) 07/30/11 dx   Right  invasive ductal ca 7 0'clock,& left breast=invasive ductal ca  and dcis, left axilla nlymph node, metastatic ca  . Bronchitis    hx  . Cancer ([place]) 08-13-11   07-31-11-Dx. Bilarteral Breast cancer-left greater than rt.  . Cancer of central portion of left female breast ([place]) 08/01/2011  . Diabetes mellitus without complication ([place])   . GERD (gastroesophageal reflux disease)    doing well  . Hematuria, undiagnosed cause 08-13-11   Being evaluated by [facility] 08-14-11  . History of radiation therapy 02/20/12-04/15/12   left breast,total 60.4 Gy  . Hypertension   . Radiation-induced dermatitis 03/26/2012   Using radioplex cream, plus neosporin.   . S/P radiation therapy 10/09/14-10/27/14   whole brain 37.5Gy/13fx  . Seasonal allergies   . Seroma 02/04/12   right breast  200cc removed  erythema on right side  . Wears partial dentures    wears upper and lower partial    Past Surgical History:  Procedure Laterality Date  . BREAST SURGERY    . CHEST TUBE INSERTION Left 12/14/2014   Procedure: CHEST TUBE INSERTION;  Surgeon: Rexene Alberts, MD;  Location: [facility];  Service: Thoracic;  Laterality: Left;  . child birth  08-13-11   x3 -NVD  . MASTECTOMY W/ SENTINEL NODE BIOPSY  01/21/2012   Procedure: MASTECTOMY WITH  SENTINEL LYMPH NODE BIOPSY;  Surgeon: Harl Bowie, MD;  Location: [facility];  Service: General;  Laterality: Bilateral;  Left modified radical mastectomy, Rt mastectomy with Sentinel lymphnode biospy  . PORT-A-CATH REMOVAL Right 12/22/2012   Procedure: REMOVAL PORT-A-CATH;  Surgeon: Harl Bowie, MD;  Location: [facility];  Service: General;  Laterality: Right;  . PORTACATH PLACEMENT  08/15/2011   Procedure: INSERTION PORT-A-CATH;  Surgeon: Harl Bowie, MD;  Location: [facility];  Service: General;  Laterality: N/A;  . SUBXYPHOID PERICARDIAL WINDOW N/A 12/14/2014   Procedure: SUBXYPHOID PERICARDIAL WINDOW;  Surgeon: Rexene Alberts, MD;  Location: [facility];  Service: Thoracic;  Laterality: N/A;  . TEE WITHOUT CARDIOVERSION N/A 12/14/2014   Procedure: TRANSESOPHAGEAL ECHOCARDIOGRAM (TEE);  Surgeon: Rexene Alberts, MD;  Location: [facility];  Service: Thoracic;  Laterality: N/A;    Family History  Problem Relation Age of Onset  . Heart disease Mother   . Cancer Mother 3    breast, , 35 deceased  . Heart attack Father   . Cancer Sister 37    breast  . Colon cancer Neg Hx     Social History:  reports that she has never smoked. She has never used smokeless tobacco. She reports that she drinks alcohol. She reports that she does not use drugs.  Allergies: No Known Allergies  Medications: I have reviewed the patient's current medications.  Results for orders placed or performed during the hospital encounter  of 09/17/16 (from the past 48 hour(s))  CBC     Status: Abnormal   Collection Time: 09/20/16  8:09 PM  Result Value Ref Range   WBC 7.0 4.0 - 10.5 K/uL   RBC 2.41 (L) 3.87 - 5.11 MIL/uL   Hemoglobin 7.3 (L) 12.0 - 15.0 g/dL   HCT 22.4 (L) 36.0 - 46.0 %   MCV 92.9 78.0 - 100.0 fL   MCH 30.3 26.0 - 34.0 pg   MCHC 32.6 30.0 - 36.0 g/dL   RDW 16.9 (H) 11.5 - 15.5 %   Platelets 127 (L) 150 - 400 K/uL  CBC     Status: Abnormal   Collection Time:  09/21/16  4:57 AM  Result Value Ref Range   WBC 7.0 4.0 - 10.5 K/uL   RBC 2.28 (L) 3.87 - 5.11 MIL/uL   Hemoglobin 7.0 (L) 12.0 - 15.0 g/dL   HCT 21.2 (L) 36.0 - 46.0 %   MCV 93.0 78.0 - 100.0 fL   MCH 30.7 26.0 - 34.0 pg   MCHC 33.0 30.0 - 36.0 g/dL   RDW 17.0 (H) 11.5 - 15.5 %   Platelets 141 (L) 150 - 400 K/uL  Glucose, capillary     Status: None   Collection Time: 09/21/16  6:02 AM  Result Value Ref Range   Glucose-Capillary 96 65 - 99 mg/dL   Comment 1 Notify RN    Comment 2 Document in Chart   Prepare RBC     Status: None   Collection Time: 09/21/16  7:39 AM  Result Value Ref Range   Order Confirmation ORDER PROCESSED BY BLOOD BANK   Type and screen [place]     Status: None   Collection Time: 09/21/16  1:41 PM  Result Value Ref Range   ABO/RH(D) O POS    Antibody Screen NEG    Sample Expiration 09/24/2016    Unit Number [id number]    Blood Component Type RED CELLS,LR    Unit division 00    Status of Unit ISSUED,FINAL    Transfusion Status OK TO TRANSFUSE    Crossmatch Result Compatible    Unit Number [id number]    Blood Component Type RED CELLS,LR    Unit division 00    Status of Unit ISSUED,FINAL    Transfusion Status OK TO TRANSFUSE    Crossmatch Result Compatible   CBC     Status: Abnormal   Collection Time: 09/22/16  4:19 AM  Result Value Ref Range   WBC 8.9 4.0 - 10.5 K/uL   RBC 3.03 (L) 3.87 - 5.11 MIL/uL   Hemoglobin 9.0 (L) 12.0 - 15.0 g/dL    Comment: REPEATED TO VERIFY POST TRANSFUSION SPECIMEN    HCT 27.3 (L) 36.0 - 46.0 %   MCV 90.1 78.0 - 100.0 fL   MCH 29.7 26.0 - 34.0 pg   MCHC 33.0 30.0 - 36.0 g/dL   RDW 18.0 (H) 11.5 - 15.5 %   Platelets 165 150 - 400 K/uL  Glucose, capillary     Status: None   Collection Time: 09/22/16  5:45 AM  Result Value Ref Range   Glucose-Capillary 96 65 - 99 mg/dL  Glucose, capillary     Status: Abnormal   Collection Time: 09/22/16  7:50 AM  Result Value Ref Range    Glucose-Capillary 103 (H) 65 - 99 mg/dL  Urinalysis, Routine w reflex microscopic     Status: None   Collection Time: 09/22/16 10:19 AM  Result Value  Ref Range   Color, Urine YELLOW YELLOW   APPearance CLEAR CLEAR   Specific Gravity, Urine 1.011 1.005 - 1.030   pH 6.0 5.0 - 8.0   Glucose, UA NEGATIVE NEGATIVE mg/dL   Hgb urine dipstick NEGATIVE NEGATIVE   Bilirubin Urine NEGATIVE NEGATIVE   Ketones, ur NEGATIVE NEGATIVE mg/dL   Protein, ur NEGATIVE NEGATIVE mg/dL   Nitrite NEGATIVE NEGATIVE   Leukocytes, UA NEGATIVE NEGATIVE  Blood gas, arterial     Status: Abnormal   Collection Time: 09/22/16 10:48 AM  Result Value Ref Range   FIO2 21.00    pH, Arterial 7.490 (H) 7.350 - 7.450   pCO2 arterial 36.3 32.0 - 48.0 mmHg   pO2, Arterial 80.5 (L) 83.0 - 108.0 mmHg   Bicarbonate 27.2 20.0 - 28.0 mmol/L   Acid-Base Excess 4.0 (H) 0.0 - 2.0 mmol/L   O2 Saturation 96.2 %   Patient temperature 99.4    Collection site RIGHT RADIAL    Drawn by [id number]    Sample type ARTERIAL DRAW    Allens test (pass/fail) PASS PASS  Vitamin B12     Status: Abnormal   Collection Time: 09/22/16 12:07 PM  Result Value Ref Range   Vitamin B-12 2,204 (H) 180 - 914 pg/mL    Comment: (NOTE) This assay is not validated for testing neonatal or myeloproliferative syndrome specimens for Vitamin B12 levels.   Folate     Status: None   Collection Time: 09/22/16 12:07 PM  Result Value Ref Range   Folate 38.3 >5.9 ng/mL  Iron and TIBC     Status: Abnormal   Collection Time: 09/22/16 12:07 PM  Result Value Ref Range   Iron 29 28 - 170 ug/dL   TIBC 280 250 - 450 ug/dL   Saturation Ratios 10 (L) 10.4 - 31.8 %   UIBC 251 ug/dL  Ferritin     Status: Abnormal   Collection Time: 09/22/16 12:07 PM  Result Value Ref Range   Ferritin 804 (H) 11 - 307 ng/mL  Reticulocytes     Status: Abnormal   Collection Time: 09/22/16 12:07 PM  Result Value Ref Range   Retic Ct Pct 2.6 0.4 - 3.1 %   RBC. 2.96 (L) 3.87 - 5.11  MIL/uL   Retic Count, Manual 77.0 19.0 - 186.0 K/uL  Ammonia     Status: None   Collection Time: 09/22/16 12:07 PM  Result Value Ref Range   Ammonia 18 9 - 35 umol/L  Hepatic function panel     Status: Abnormal   Collection Time: 09/22/16 12:07 PM  Result Value Ref Range   Total Protein 6.5 6.5 - 8.1 g/dL   Albumin 2.6 (L) 3.5 - 5.0 g/dL   AST 67 (H) 15 - 41 U/L   ALT 38 14 - 54 U/L   Alkaline Phosphatase 108 38 - 126 U/L   Total Bilirubin 1.5 (H) 0.3 - 1.2 mg/dL   Bilirubin, Direct 0.3 0.1 - 0.5 mg/dL   Indirect Bilirubin 1.2 (H) 0.3 - 0.9 mg/dL  Glucose, capillary     Status: None   Collection Time: 09/22/16  3:00 PM  Result Value Ref Range   Glucose-Capillary 91 65 - 99 mg/dL   Comment 1 Notify RN    Comment 2 Document in Chart     Ct Head Wo Contrast  Addendum Date: 09/22/2016   ADDENDUM REPORT: 09/22/2016 09:34 ADDENDUM: Study discussed by telephone with Dr. Joette Catching on 09/22/2016 at 09:32 . He advises the patient  is afebrile, with no leukocytosis, but with worsening mental status. We discussed repeat MRI without and with contrast in hopes of identifying the cause of the ventricular enlargement. Electronically Signed   By: Genevie Ann M.D.   On: 09/22/2016 09:34   Result Date: 09/22/2016 CLINICAL DATA:  71 year old female diagnosed with acute pulmonary embolism on 09/17/2016. 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 EXAM: CT HEAD WITHOUT CONTRAST TECHNIQUE: Contiguous axial images were obtained from the base of the skull through the vertex without intravenous contrast. COMPARISON:  Brain MRI without and with contrast 09/18/2016, and earlier. FINDINGS: Brain: No acute intracranial hemorrhage identified. No cortically based acute infarct identified. However, the lateral and third ventricles have increased in size since 09/18/2016. The temporal horns are now mildly dilated. There  may be new transependymal edema, of uncertain as there is extensive underlying chronic cerebral white matter hypodensity from previous whole brain radiation. No intracranial mass effect or discrete mass lesion is identified. Basilar cisterns remain patent. Vascular: Calcified atherosclerosis at the skull base. No suspicious intracranial vascular hyperdensity. Skull: Stable visualized osseous structures. Hyperostosis of the calvarium. An 8-9 mm round ground-glass density area in the left frontal bone on series 202, image 19 is stable over the prior studies and probably is a treated bone metastasis. No acute or suspicious osseous lesions identified. Sinuses/Orbits: Unchanged left, and trace right, mastoid effusions. Visualized paranasal sinuses are stable and well pneumatized. Other: Visualized orbits and scalp soft tissues are within normal limits. IMPRESSION: 1. Acute ventriculomegaly since 09/18/2016 of unclear etiology. Evidence of new transependymal edema, but no obstructing lesion or new brain mass identified on noncontrast CT. Query infectious ventriculitis. Repeat brain MRI without and with contrast recommended. 2. Sequelae of prior whole brain radiation. 3. 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 Electronically Signed: By: Genevie Ann M.D. On: 09/22/2016 09:20   Mr Jeri Cos NW Contrast  Result Date: 09/22/2016 CLINICAL DATA:  Altered mental status. EXAM: MRI HEAD WITHOUT AND WITH CONTRAST TECHNIQUE: Multiplanar, multiecho pulse sequences of the brain and surrounding structures were obtained without and with intravenous contrast. CONTRAST:  84mL MULTIHANCE GADOBENATE DIMEGLUMINE 529 MG/ML IV SOLN COMPARISON:  Head CT 09/22/2016 and MRI 09/18/2016 FINDINGS: Multiple sequences are mildly motion degraded. Brain: There is a thin curvilinear region of restricted diffusion in the midline along the splenium of the corpus callosum. No restricted  diffusion is seen elsewhere. No mass, midline shift, or extra-axial fluid collection is identified. As described on today's earlier head CT, there is mild lateral and third ventriculomegaly, including dilatation of the temporal horns. This is new from the recent prior MRI. The fourth ventricle is not dilated. There is confluent T2 hyperintensity throughout the periventricular white matter which has increased, most notably in the temporal lobes suggesting transependymal CSF flow superimposed on chronic white matter changes likely related to prior radiation therapy. No abnormal enhancement is identified. A few punctate foci of susceptibility artifact in the atria/occipital horns of the lateral ventricles are new and compatible with intraventricular blood products. More prominent susceptibility artifact as well as a 1 cm nodular focus of T2 hyperintensity are present posteriorly in the third ventricle, also favored to reflect blood products. Additional susceptibility compatible with blood products is present in the cerebral aqueduct and fourth ventricle. There is also evidence of small volume subarachnoid hemorrhage along the multiple superior cerebellar folia. Vascular: Major  intracranial vascular flow voids are preserved, with the right vertebral artery being dominant. Skull and upper cervical spine: 9 mm left frontal skull lesion as described on CT. Sinuses/Orbits: Unremarkable orbits. Clear sinuses. Large left and trace right mastoid effusions. Other: None. IMPRESSION: 1. New, relatively small volume blood products in the lateral, third, and fourth ventricles with small volume posterior fossa subarachnoid hemorrhage. This is most notable at the level of the posterior third ventricle and proximal aqueduct resulting in mild obstructive hydrocephalus. 2. Small volume restricted diffusion in the splenium of the corpus callosum. This finding is often transient and can be seen in association with subarachnoid hemorrhage,  seizure activity, drug toxicity (including chemotherapy agents), infection, and metabolic disturbances. 3. Increased cerebral white matter T2 signal abnormality compatible with acute transependymal CSF flow superimposed on chronic radiation changes. 4. No enhancing lesions to clearly indicate intracranial metastatic disease. Critical Value/emergent results were called by telephone at the time of interpretation on 09/22/2016 at 1:28 pm to Dr. Joette Catching , who verbally acknowledged these results. Electronically Signed   By: Logan Bores M.D.   On: 09/22/2016 13:31    Review of Systems  Unable to perform ROS: Mental acuity   Blood pressure (!) 183/69, pulse 85, temperature 98.1 F (36.7 C), temperature source Oral, resp. rate 18, height 5\' 9"  (1.753 m), weight 97.4 kg (214 lb 11.7 oz), SpO2 100 %. Physical Exam  Constitutional: She appears well-developed and well-nourished. No distress.  HENT:  Head: Normocephalic and atraumatic.  Eyes: EOM are normal. Pupils are equal, round, and reactive to light.  Neck: Normal range of motion. Neck supple.  Cardiovascular: Normal rate and regular rhythm.   Respiratory: Effort normal and breath sounds normal.  GI: Soft. Bowel sounds are normal.  Musculoskeletal: Normal range of motion.  Neurological: She is alert. No cranial nerve deficit or sensory deficit.  Moving all extremities, 5-/5 Slight lethargy, but briskly following commands Gait not assessed    Assessment/Plan: For now will hold on placing ventricular catheter, as the patient is awake and following commands. Will consult with radiology.   Doylene Splinter L 09/22/2016, 6:00 PM

## 2016-09-22 NOTE — Progress Notes (Signed)
Upon assessment of pt, pt LOC is different than yesterday. Pt cannot answer questions, only continues to state her birth date. PT has bilateral weak grips, and does not follow simple commands well.  PT pupils sluggish and pt cannot open eyes by herself. VS documented. Pt has large hematoma on Right buttocks. RRN called to assess pt. Paged MD to make aware. Orders placed for CT of head, this RN and RRN took Pt to CT scan. MD on floor at this time.

## 2016-09-22 NOTE — Progress Notes (Signed)
[place] TEAM 1 - Stepdown/ICU TEAM  ALIA PARSLEY  ZTI:[id number] DOB: 1946/01/08 DOA: 09/17/2016 PCP: No PCP Per Patient    Brief Narrative:  71 year old female with history of breast cancer s/p B mastectomys, XRT, and currently on chemotherapy, hypertension, GERD, peripheral vascular disease, diastolic CHF, and a pericardial effusion who came to the ER with left arm swelling, as well as altered mental status.  CT of the head suggested focal hypoattenuation within the left cerebellum concerning for possible acute ischemic stroke. Neurology was consulted and MRI was negative for stroke or any metastatic disease in her brain. CT of the chest revealed pulmonary embolism with evidence of right-sided heart strain. She was started on heparin drip and then transitioned to Lovenox 1 mg-kilogram every 12 hours as needed.  Subjective: Last PM the pt became altered.  She remains minimally responsive.  She repeats her birthday when asked multiple questions.  She will answer some questions w/ very brief answers.  She is not in respiratory distress nor dose she appear to be in pain.  Her vitals are otherwise stable.    Assessment & Plan:  Altered mental status - encephalopathy  -has now recurred over the last 12 hours - CT head negative for ICH - r/o metabolic etiology - no focal deficit to suggest CVA -CT of the head at admission showed questionable acute ischemia therefore MRI of the brain was done which was negative for any acute pathology including any metastatic disease -EEG done-negative  Submassive pulmonary embolism with right heart strain -heparin drip transitioned to Lovenox 1 mg/kilogram every 12 hours -Upper extremity and lower extremity Dopplers negative for DVT -MRI of the brain shows chronic white matter changes otherwise no brain lesions or metastatic disease -Echocardiogram done in March 2018 was relatively normal  ?Bacteremia - 1 of 2 blood cx + for Staph lugdunensis -appears to  most likely be a contaminant - follow clinically   Anemia - R buttock hematoma  -likely due to bleeding into R buttock on IV heparin -s/p 2U PRBC 4/8 - need to follow trend to assure remains stable before d/c on home lovenox   Left upper extremity swelling/rednesd - cellulitis v/s lymphedema -Dopplers negative -Keflex started on 09/18/2016 - continue for 5 days -lymphedema sleeve  -elevate arm   Chronic diastolic congestive heart failure  Hypertension -BP trending upward - adjust tx and follow   GERD -Protonix   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 Jana Hakim. Has tentative appts on 4/16 and 10/09/16  DVT prophylaxis: lovenox  Code Status: FULL CODE Family Communication: no family present at time of exam  Disposition Plan: not ready for d/c   Consultants:  Oncology   Antimicrobials:  Keflex 4/5 >  Objective: Blood pressure (!) 165/55, pulse 99, temperature 99.4 F (37.4 C), temperature source Rectal, resp. rate 18, height 5\' 9"  (1.753 m), weight 87.4 kg (192 lb 10.9 oz), SpO2 98 %.  Intake/Output Summary (Last 24 hours) at 09/22/16 0904 Last data filed at 09/22/16 0602  Gross per 24 hour  Intake              678 ml  Output             1000 ml  Net             -322 ml   Filed Weights   09/20/16 0547 09/21/16 0603 09/22/16 0602  Weight: 94.1 kg (  207 lb 8 oz) 94.8 kg (209 lb) 87.4 kg (192 lb 10.9 oz)    Examination: General: No acute respiratory distress - obtunded w/o focal deficits  Lungs: Clear to auscultation bilaterally without wheezes or crackles Cardiovascular: Regular rate and rhythm without murmur gallop or rub  Abdomen: Nontender, nondistended, soft, bowel sounds positive, no rebound, no ascites, no appreciable mass Extremities: No significant cyanosis, clubbing, or edema bilateral lower extremities - L UE w/ 1+ edema - 2+ DP  pulses B LE  Neuro:  No facial asymmetry - tongue midline - opens eyes equally - no drooping of mouth - pupils sluggish but equal - 4/5 strength B U&LE - no babinsi   CBC:  Recent Labs Lab 09/19/16 0229 09/20/16 0315 09/20/16 2009 09/21/16 0457 09/22/16 0419  WBC 8.3 6.3 7.0 7.0 8.9  HGB 9.2* 7.4* 7.3* 7.0* 9.0*  HCT 27.3* 22.7* 22.4* 21.2* 27.3*  MCV 93.8 93.4 92.9 93.0 90.1  PLT 94* 114* 127* 141* 676   Basic Metabolic Panel:  Recent Labs Lab 09/17/16 1842 09/18/16 0345  NA 134* 138  K 3.4* 3.4*  CL 99* 104  CO2 26 27  GLUCOSE 164* 113*  BUN 18 15  CREATININE 0.72 0.60  CALCIUM 9.1 8.9   GFR: Estimated Creatinine Clearance: 77.2 mL/min (by C-G formula based on SCr of 0.6 mg/dL).  Liver Function Tests: No results for input(s): AST, ALT, ALKPHOS, BILITOT, PROT, ALBUMIN in the last 168 hours. No results for input(s): LIPASE, AMYLASE in the last 168 hours. No results for input(s): AMMONIA in the last 168 hours.  Coagulation Profile: No results for input(s): INR, PROTIME in the last 168 hours.  Cardiac Enzymes:  Recent Labs Lab 09/17/16 1842  TROPONINI <0.03    HbA1C: Hgb A1c MFr Bld  Date/Time Value Ref Range Status  09/18/2016 03:45 AM 5.4 4.8 - 5.6 % Final    Comment:    (NOTE)         Pre-diabetes: 5.7 - 6.4         Diabetes: >6.4         Glycemic control for adults with diabetes: <7.0     CBG:  Recent Labs Lab 09/19/16 0727 09/20/16 0601 09/21/16 0602 09/22/16 0545 09/22/16 0750  GLUCAP 85 100* 96 96 103*    Recent Results (from the past 240 hour(s))  Blood culture (routine x 2)     Status: Abnormal (Preliminary result)   Collection Time: 09/17/16  9:10 PM  Result Value Ref Range Status   Specimen Description BLOOD RIGHT ANTECUBITAL  Final   Special Requests   Final    BOTTLES DRAWN AEROBIC AND ANAEROBIC Blood Culture adequate volume   Culture  Setup Time   Final    GRAM POSITIVE COCCI IN CLUSTERS IN BOTH AEROBIC AND ANAEROBIC  BOTTLES CRITICAL RESULT CALLED TO, READ BACK BY AND VERIFIED WITH: C BALL,PHARMD AT 1759 09/20/16 BY L BENFIELD    Culture (A)  Final    STAPHYLOCOCCUS LUGDUNENSIS SUSCEPTIBILITIES TO FOLLOW    Report Status PENDING  Incomplete  Blood Culture ID Panel (Reflexed)     Status: Abnormal   Collection Time: 09/17/16  9:10 PM  Result Value Ref Range Status   Enterococcus species NOT DETECTED NOT DETECTED Final   Listeria monocytogenes NOT DETECTED NOT DETECTED Final   Staphylococcus species DETECTED (A) NOT DETECTED Final    Comment: Methicillin (oxacillin) susceptible coagulase negative staphylococcus. Possible blood culture contaminant (unless isolated from more than one blood culture draw or  clinical case suggests pathogenicity). No antibiotic treatment is indicated for blood  culture contaminants. CRITICAL RESULT CALLED TO, READ BACK BY AND VERIFIED WITH: C BALL,PHARMD AT 1759 09/20/16 BY L BENFIELD    Staphylococcus aureus NOT DETECTED NOT DETECTED Final   Methicillin resistance NOT DETECTED NOT DETECTED Final   Streptococcus species NOT DETECTED NOT DETECTED Final   Streptococcus agalactiae NOT DETECTED NOT DETECTED Final   Streptococcus pneumoniae NOT DETECTED NOT DETECTED Final   Streptococcus pyogenes NOT DETECTED NOT DETECTED Final   Acinetobacter baumannii NOT DETECTED NOT DETECTED Final   Enterobacteriaceae species NOT DETECTED NOT DETECTED Final   Enterobacter cloacae complex NOT DETECTED NOT DETECTED Final   Escherichia coli NOT DETECTED NOT DETECTED Final   Klebsiella oxytoca NOT DETECTED NOT DETECTED Final   Klebsiella pneumoniae NOT DETECTED NOT DETECTED Final   Proteus species NOT DETECTED NOT DETECTED Final   Serratia marcescens NOT DETECTED NOT DETECTED Final   Haemophilus influenzae NOT DETECTED NOT DETECTED Final   Neisseria meningitidis NOT DETECTED NOT DETECTED Final   Pseudomonas aeruginosa NOT DETECTED NOT DETECTED Final   Candida albicans NOT DETECTED NOT DETECTED  Final   Candida glabrata NOT DETECTED NOT DETECTED Final   Candida krusei NOT DETECTED NOT DETECTED Final   Candida parapsilosis NOT DETECTED NOT DETECTED Final   Candida tropicalis NOT DETECTED NOT DETECTED Final  Blood culture (routine x 2)     Status: None (Preliminary result)   Collection Time: 09/17/16  9:15 PM  Result Value Ref Range Status   Specimen Description BLOOD RIGHT HAND  Final   Special Requests   Final    BOTTLES DRAWN AEROBIC AND ANAEROBIC Blood Culture adequate volume   Culture NO GROWTH 4 DAYS  Final   Report Status PENDING  Incomplete  MRSA PCR Screening     Status: None   Collection Time: 09/18/16 10:20 AM  Result Value Ref Range Status   MRSA by PCR NEGATIVE NEGATIVE Final    Comment:        The GeneXpert MRSA Assay (FDA approved for NASAL specimens only), is one component of a comprehensive MRSA colonization surveillance program. It is not intended to diagnose MRSA infection nor to guide or monitor treatment for MRSA infections.      Scheduled Meds: . anastrozole  1 mg Oral Daily  . B-complex with vitamin C  1 tablet Oral Daily  . cephALEXin  500 mg Oral Q8H  . cholecalciferol  1,000 Units Oral Daily  . docusate sodium  100 mg Oral BID  . enoxaparin (LOVENOX) injection  1 mg/kg Subcutaneous Q12H  . magnesium oxide  400 mg Oral Daily  . multivitamins with iron  1 tablet Oral Daily  . sodium chloride flush  3 mL Intravenous Q12H     LOS: 4 days   Cherene Altes, MD [facility] Office  [phone] Pager - Text Page per Amion as per below:  On-Call/Text Page:      [email or web address]      password [email or web address]  If 7PM-7AM, please contact night-coverage [email or web address] Password [email or web address] 09/22/2016, 9:04 AM

## 2016-09-22 NOTE — Progress Notes (Signed)
RT has paged MD twice since 0954 and sent an Amion message. RT needs approval to stick patient for ABG due to patient having double restricted extremities. RN aware.

## 2016-09-22 NOTE — Progress Notes (Signed)
Nilda Simmer, RN travelling to MRI with patient.

## 2016-09-22 NOTE — Significant Event (Signed)
Rapid Response Event Note  Overview:  Called to assist with reassessment of patient with decreased LOC Time Called: 0865 Arrival Time: 0803 Event Type: Neurologic  Initial Focused Assessment:  On arrival patient responds to loud voice - very lethargic - will follow simple commands but slowly - repeats birthday with some questions - oriented to person - place - moves all extremities to command - just very weak bilaterally - equally weak - tongue midline - pupils 23mm sluggish - right buttocks area with very large bruise with area of firmness in the middle - patient states this is not new - staff report the bruising old but not sure about the firmness - right s/c port-a cath intact - site ok - abd soft - no reaction to palpitation.  Resps regular and unlabored.  Skin very hot and dry to touch - rectal temp 99.4 - rechecked with same results.  HR SR 84.   BP 165/55 - right leg - no arm BP's secondary to recent mastectomy and DVT - RR 18 O2 sats 98% on RA.  Few coarse BS.     Interventions:  Call to MD Dr. Thereasa Solo by Janett Billow RN - he placed orders for stat CT scan.  Transported to CT scan without incident.  Dr. Thereasa Solo to bedside.  Will order more labs - for now slow to respond - vital signs stable - he will review CT scan.  Handoff to [employer].    Follow Up:  CT scan with new changes - Dr. Thereasa Solo aware - MRI ordered. ABG unrevealing.  Will follow as needed.    Plan of Care (if not transferred):  Event Summary: Name of Physician Notified: Dr. Thereasa Solo at  (pta RRT)    at    Outcome: Stayed in room and stabalized  Event End Time: 0915  Quin Hoop

## 2016-09-22 NOTE — Care Management Important Message (Signed)
Important Message  Patient Details  Name: Linda [name] MRN: [id number] Date of Birth: Jun 07, 1946   Medicare Important Message Given:  Yes    Orbie Pyo 09/22/2016, 12:29 PM

## 2016-09-22 NOTE — Progress Notes (Signed)
[facility] Progress Note Patient Name: Linda [name] DOB: [birth year] MRN: [id number]   Date of Service  09/22/2016  HPI/Events of Note  Altered mental status, ventriculomegaly, anticoag stopped  eICU Interventions  Awaiting neuro input for vstomy     Intervention Category Evaluation Type: New Patient Evaluation  Linda Bastien V. 09/22/2016, 4:15 PM

## 2016-09-22 NOTE — Progress Notes (Signed)
PT Cancellation Note  Patient Details Name: Linda [name] MRN: [id number] DOB: [birth year]   Cancelled Treatment:    Reason Eval/Treat Not Completed: Patient at procedure or test/unavailable   Ranbir Chew B Saleah Rishel 09/22/2016, 11:50 AM  Elwyn Reach, [place]

## 2016-09-22 NOTE — Progress Notes (Signed)
Called report to Melody, RN on Frederika. Pt belongings packed and pt transferred to 3M14.

## 2016-09-22 NOTE — Progress Notes (Signed)
Spoke with Dr. Thereasa Solo about pt current status, he informed me that he has placed transfer orders. Bed request has been placed.

## 2016-09-23 ENCOUNTER — Inpatient Hospital Stay (HOSPITAL_COMMUNITY): Payer: Medicare Other

## 2016-09-23 LAB — GLUCOSE, CAPILLARY
GLUCOSE-CAPILLARY: 91 mg/dL (ref 65–99)
GLUCOSE-CAPILLARY: 119 mg/dL — AB (ref 65–99)
Glucose-Capillary: 85 mg/dL (ref 65–99)
Glucose-Capillary: 98 mg/dL (ref 65–99)
Glucose-Capillary: 129 mg/dL — ABNORMAL HIGH (ref 65–99)

## 2016-09-23 LAB — CBC
HCT: 25.8 % — ABNORMAL LOW (ref 36.0–46.0)
Hemoglobin: 8.4 g/dL — ABNORMAL LOW (ref 12.0–15.0)
MCH: 30 pg (ref 26.0–34.0)
MCHC: 32.6 g/dL (ref 30.0–36.0)
MCV: 92.1 fL (ref 78.0–100.0)
PLATELETS: 166 10*3/uL (ref 150–400)
RBC: 2.8 MIL/uL — ABNORMAL LOW (ref 3.87–5.11)
RDW: 18.2 % — AB (ref 11.5–15.5)
WBC: 10.2 10*3/uL (ref 4.0–10.5)

## 2016-09-23 LAB — BASIC METABOLIC PANEL
Anion gap: 6 (ref 5–15)
Anion gap: 9 (ref 5–15)
BUN: 15 mg/dL (ref 6–20)
BUN: 14 mg/dL (ref 6–20)
CO2: 26 mmol/L (ref 22–32)
CO2: 28 mmol/L (ref 22–32)
CREATININE: 0.47 mg/dL (ref 0.44–1.00)
CREATININE: 0.46 mg/dL (ref 0.44–1.00)
Calcium: 8.7 mg/dL — ABNORMAL LOW (ref 8.9–10.3)
Calcium: 8.9 mg/dL (ref 8.9–10.3)
Chloride: 106 mmol/L (ref 101–111)
Chloride: 108 mmol/L (ref 101–111)
GFR calc Af Amer: >60 mL/min (ref >60)
GFR calc Af Amer: >60 mL/min (ref >60)
GFR calc non Af Amer: >60 mL/min (ref >60)
GLUCOSE: 110 mg/dL — AB (ref 65–99)
GLUCOSE: 124 mg/dL — AB (ref 65–99)
POTASSIUM: 3.6 mmol/L (ref 3.5–5.1)
POTASSIUM: 3.4 mmol/L — AB (ref 3.5–5.1)
SODIUM: 141 mmol/L (ref 135–145)
SODIUM: 142 mmol/L (ref 135–145)

## 2016-09-23 LAB — RPR: RPR Ser Ql: NONREACTIVE

## 2016-09-23 LAB — VITAMIN D 25 HYDROXY (VIT D DEFICIENCY, FRACTURES): Vit D, 25-Hydroxy: 57 ng/mL (ref 30.0–100.0)

## 2016-09-23 LAB — CULTURE, BLOOD (ROUTINE X 2)
CULTURE: NO GROWTH
Special Requests: ADEQUATE

## 2016-09-23 LAB — TSH: TSH: 3.331 u[IU]/mL (ref 0.350–4.500)

## 2016-09-23 LAB — HIV ANTIBODY (ROUTINE TESTING W REFLEX): HIV Screen 4th Generation wRfx: NONREACTIVE

## 2016-09-23 NOTE — Progress Notes (Addendum)
PROGRESS NOTE    Linda [name]  SEG:[id number] DOB: Aug 20, 1945 DOA: 09/17/2016 PCP: No PCP Per Patient   Brief Narrative:  71 year old female with past medical history of breast cancer that is post bilateral mastectomy, chemoradiation and currently on chemotherapy, hypertension, GERD, peripheral vascular disease, diastolic CHF, pericardial effusion came to the ER with the complaints of left-sided arm swelling. At that time she also had some change in mental status therefore CT of the head was done which showed focal hypoattenuation within the left cerebellum concerning for possible acute ischemic stroke. Neurology was consulted and MRI was done which was negative for stroke or any metastatic disease in her brain. PT of the chest was also done which revealed pulmonary embolism with evidence of right-sided heart strain with concerns of submassive pulmonary embolism. She was started on heparin drip and then transitioned to Lovenox 1 mg-kilogram every 12 hours as needed.   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 Encephalopathy could have been secondary to seizures versus stroke versus hydrocephalus -Neurosurgery has been following the patient will determine if patient needs a VP shunt versus any other intervention. Per their last note below discuss this with radiology. -I will order lumbar puncture to be done with IR.   Anemia-likely blood loss -Unknown etiology. No  active signs of blood loss.  Looks like she has right gluteal hematoma -CT of the abdomen pelvis has been ordered -She status post 2 units PRBC transfusion on 09/21/2016 -Holding off on anticoagulation. Repeat CBC today and again tomorrow morning  Pulmonary embolism-submassive pulmo right nary embolism with right-sided heart strain -Lovenox 1 mg/kilogram every 12 hours but it has been on hold for now due to history of possible intracranial bleeding and concerning right gluteal hematoma. Neurosurgery is on board and refill restart once cleared by them and right gluteal area has been evaluated. -Upper extremity and lower extremity Dopplers were done which were negative for any DVT -MRI of the brain shows chronic white matter changes otherwise no brain lesions or metastatic disease -Echocardiogram done in March 2018 was relatively normal.  Left upper extremity swelling/redness-questionable cellulitis versus lymphedema; improved -Dopplers have been negative -Completed 5 day treatment with Keflex. -Give her lymphedema sleeve  -Place arm on the pillow for elevation.  Concerns for stroke/encephalopathy; resolved  -CT of the head showed questionable acute ischemia therefore MRI of the brain was done which was negative for any acute pathology including any metastatic disease -EEG done-negative  Bacteremia -One set of blood culture positive for Staphylococcus Lugdunensi which is likely contaminant -Continue to monitor for any signs of infection at this time otherwise no need for antibiotics.  Chronic diastolic congestive heart failure, compensated/stable -We will restart her Lasix and spironolactone tomorrow/at the time of discharge  Hypertension -Continue IV hydralazine as needed for now  GERD -Protonix as needed  Bilateral breast cancer metastasized to brain and lungs? -Status post bilateral mastectomy, radiation therapy and currently getting chemotherapy -Follows up with Dr. Jana Hakim.  Has tentative appts on 4/16 and  10/09/16  Constipation -Bowel regimen as needed  Physical therapy recommended home PT due generalized deconditioning. It has been ordered.  DVT prophylaxis: Lovenox Code Status: Full Family Communication:  Family present at bedside Disposition Plan: Transferred to stepdown once cleared by neurosurgery  Consultants:   Oncology  Procedures:   None  Antimicrobials:   Keflex 09/18/2016   Subjective: On 09/22/2016 rapid response was called due to change in mental status and CT of the head was done which showed possible new changes therefore MRI of the brain was ordered. MRI of the brain showed new relatively small volume possible subarachnoid hemorrhage and the ventricles with signs of obstructive hydrocephalus. Neurosurgery was consulted and she was transferred to neuro ICU. Over the course of 12 hours her mental status returned to the baseline. Neurosurgery had recommended placing a VP shunt and will discuss the case with the radiologist prior to that. Since then patient has been asymptomatic and she is awake this morning. Her mental status appears to be at her baseline. She denies having such symptoms in the past. She does tell me that she is having right buttock pain than she sitting on the chair otherwise no other acute signs of bleeding again. She denies any shortness of breath, chest pain and other complaints.  Objective: Vitals:   09/23/16 0745 09/23/16 0800 09/23/16 0900 09/23/16 1000  BP:  (!) 165/63 (!) 157/62 (!) 161/84  Pulse: 88 83 85 95  Resp: 20 18 13 15   Temp:  98.6 F (37 C)    TempSrc:  Oral    SpO2: 99% 99% 97% 99%  Weight:      Height:        Intake/Output Summary (Last 24 hours) at 09/23/16 1036 Last data filed at 09/23/16 1000  Gross per 24 hour  Intake              620 ml  Output              725 ml  Net             -105 ml   Filed Weights   09/21/16 0603 09/22/16 0602 09/22/16 1456  Weight: 94.8 kg (209 lb) 87.4 kg  (192 lb 10.9 oz) 97.4 kg (214 lb 11.7 oz)    Examination:  General exam: Appears calm and comfortable  Respiratory system: Clear to auscultation. Respiratory effort normal. Cardiovascular system: S1 & S2 heard, RRR. No JVD, murmurs, rubs, gallops or clicks. No pedal edema. Gastrointestinal system: Abdomen is nondistended, soft and nontender. No organomegaly or masses felt. Normal bowel sounds heard. Central nervous system: Alert and oriented. No focal neurological deficits. Extremities: Symmetric 5 x 5 power.Nonpitting edema for left upper extremity with significant improvement of erythema. Skin: No rashes, lesions or ulcers Psychiatry: Judgement and insight appear normal. Mood & affect appropriate.  Right gluteal ecchymosis with hardened area with concerns for hematoma.  Dressing in place at the area of sacral region.   Data Reviewed:   CBC:  Recent Labs Lab 09/19/16 0229 09/20/16 0315 09/20/16 2009 09/21/16 0457 09/22/16 0419  WBC 8.3 6.3 7.0 7.0 8.9  HGB 9.2* 7.4* 7.3* 7.0* 9.0*  HCT 27.3* 22.7* 22.4* 21.2* 27.3*  MCV 93.8 93.4 92.9 93.0 90.1  PLT 94* 114* 127* 141* 161   Basic Metabolic Panel:  Recent Labs Lab 09/17/16 1842 09/18/16 0345 09/22/16 2330  NA 134* 138 142  K 3.4* 3.4* 3.4*  CL 99* 104 108  CO2 26 27 28   GLUCOSE 164*  113* 124*  BUN 18 15 14   CREATININE 0.72 0.60 0.46  CALCIUM 9.1 8.9 8.7*   GFR: Estimated Creatinine Clearance: 81.3 mL/min (by C-G formula based on SCr of 0.46 mg/dL). Liver Function Tests:  Recent Labs Lab 09/22/16 1207  AST 67*  ALT 38  ALKPHOS 108  BILITOT 1.5*  PROT 6.5  ALBUMIN 2.6*   No results for input(s): LIPASE, AMYLASE in the last 168 hours.  Recent Labs Lab 09/22/16 1207  AMMONIA 18   Coagulation Profile: No results for input(s): INR, PROTIME in the last 168 hours. Cardiac Enzymes:  Recent Labs Lab 09/17/16 1842  TROPONINI <0.03   BNP (last 3 results) No results for input(s): PROBNP in the last  8760 hours. HbA1C: No results for input(s): HGBA1C in the last 72 hours. CBG:  Recent Labs Lab 09/22/16 1500 09/22/16 1938 09/22/16 2315 09/23/16 0311 09/23/16 0721  GLUCAP 91 103* 121* 91 85   Lipid Profile: No results for input(s): CHOL, HDL, LDLCALC, TRIG, CHOLHDL, LDLDIRECT in the last 72 hours. Thyroid Function Tests: No results for input(s): TSH, T4TOTAL, FREET4, T3FREE, THYROIDAB in the last 72 hours. Anemia Panel:  Recent Labs  09/22/16 1207  VITAMINB12 2,204*  FOLATE 38.3  FERRITIN 804*  TIBC 280  IRON 29  RETICCTPCT 2.6   Sepsis Labs:  Recent Labs Lab 09/17/16 2117  LATICACIDVEN 1.47    Recent Results (from the past 240 hour(s))  Blood culture (routine x 2)     Status: Abnormal   Collection Time: 09/17/16  9:10 PM  Result Value Ref Range Status   Specimen Description BLOOD RIGHT ANTECUBITAL  Final   Special Requests   Final    BOTTLES DRAWN AEROBIC AND ANAEROBIC Blood Culture adequate volume   Culture  Setup Time   Final    GRAM POSITIVE COCCI IN CLUSTERS IN BOTH AEROBIC AND ANAEROBIC BOTTLES CRITICAL RESULT CALLED TO, READ BACK BY AND VERIFIED WITH: C BALL,PHARMD AT 1759 09/20/16 BY L BENFIELD    Culture STAPHYLOCOCCUS LUGDUNENSIS (A)  Final   Report Status 09/22/2016 FINAL  Final   Organism ID, Bacteria STAPHYLOCOCCUS LUGDUNENSIS  Final      Susceptibility   Staphylococcus lugdunensis - MIC*    CIPROFLOXACIN <=0.5 SENSITIVE Sensitive     ERYTHROMYCIN <=0.25 SENSITIVE Sensitive     GENTAMICIN <=0.5 SENSITIVE Sensitive     OXACILLIN 1 SENSITIVE Sensitive     TETRACYCLINE <=1 SENSITIVE Sensitive     VANCOMYCIN <=0.5 SENSITIVE Sensitive     TRIMETH/SULFA <=10 SENSITIVE Sensitive     CLINDAMYCIN <=0.25 SENSITIVE Sensitive     RIFAMPIN <=0.5 SENSITIVE Sensitive     Inducible Clindamycin NEGATIVE Sensitive     * STAPHYLOCOCCUS LUGDUNENSIS  Blood Culture ID Panel (Reflexed)     Status: Abnormal   Collection Time: 09/17/16  9:10 PM  Result Value  Ref Range Status   Enterococcus species NOT DETECTED NOT DETECTED Final   Listeria monocytogenes NOT DETECTED NOT DETECTED Final   Staphylococcus species DETECTED (A) NOT DETECTED Final    Comment: Methicillin (oxacillin) susceptible coagulase negative staphylococcus. Possible blood culture contaminant (unless isolated from more than one blood culture draw or clinical case suggests pathogenicity). No antibiotic treatment is indicated for blood  culture contaminants. CRITICAL RESULT CALLED TO, READ BACK BY AND VERIFIED WITH: C BALL,PHARMD AT 1759 09/20/16 BY L BENFIELD    Staphylococcus aureus NOT DETECTED NOT DETECTED Final   Methicillin resistance NOT DETECTED NOT DETECTED Final   Streptococcus species NOT DETECTED NOT DETECTED  Final   Streptococcus agalactiae NOT DETECTED NOT DETECTED Final   Streptococcus pneumoniae NOT DETECTED NOT DETECTED Final   Streptococcus pyogenes NOT DETECTED NOT DETECTED Final   Acinetobacter baumannii NOT DETECTED NOT DETECTED Final   Enterobacteriaceae species NOT DETECTED NOT DETECTED Final   Enterobacter cloacae complex NOT DETECTED NOT DETECTED Final   Escherichia coli NOT DETECTED NOT DETECTED Final   Klebsiella oxytoca NOT DETECTED NOT DETECTED Final   Klebsiella pneumoniae NOT DETECTED NOT DETECTED Final   Proteus species NOT DETECTED NOT DETECTED Final   Serratia marcescens NOT DETECTED NOT DETECTED Final   Haemophilus influenzae NOT DETECTED NOT DETECTED Final   Neisseria meningitidis NOT DETECTED NOT DETECTED Final   Pseudomonas aeruginosa NOT DETECTED NOT DETECTED Final   Candida albicans NOT DETECTED NOT DETECTED Final   Candida glabrata NOT DETECTED NOT DETECTED Final   Candida krusei NOT DETECTED NOT DETECTED Final   Candida parapsilosis NOT DETECTED NOT DETECTED Final   Candida tropicalis NOT DETECTED NOT DETECTED Final  Blood culture (routine x 2)     Status: None   Collection Time: 09/17/16  9:15 PM  Result Value Ref Range Status    Specimen Description BLOOD RIGHT HAND  Final   Special Requests   Final    BOTTLES DRAWN AEROBIC AND ANAEROBIC Blood Culture adequate volume   Culture NO GROWTH 6 DAYS  Final   Report Status 09/23/2016 FINAL  Final  MRSA PCR Screening     Status: None   Collection Time: 09/18/16 10:20 AM  Result Value Ref Range Status   MRSA by PCR NEGATIVE NEGATIVE Final    Comment:        The GeneXpert MRSA Assay (FDA approved for NASAL specimens only), is one component of a comprehensive MRSA colonization surveillance program. It is not intended to diagnose MRSA infection nor to guide or monitor treatment for MRSA infections.          Radiology Studies: Ct Head Wo Contrast  Addendum Date: 09/22/2016   ADDENDUM REPORT: 09/22/2016 09:34 ADDENDUM: Study discussed by telephone with Dr. Joette Catching on 09/22/2016 at 09:32 . He advises the patient is afebrile, with no leukocytosis, but with worsening mental status. We discussed repeat MRI without and with contrast in hopes of identifying the cause of the ventricular enlargement. Electronically Signed   By: Genevie Ann M.D.   On: 09/22/2016 09:34   Result Date: 09/22/2016 CLINICAL DATA:  71 year old female diagnosed with acute pulmonary embolism on 09/17/2016. 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 EXAM: CT HEAD WITHOUT CONTRAST TECHNIQUE: Contiguous axial images were obtained from the base of the skull through the vertex without intravenous contrast. COMPARISON:  Brain MRI without and with contrast 09/18/2016, and earlier. FINDINGS: Brain: No acute intracranial hemorrhage identified. No cortically based acute infarct identified. However, the lateral and third ventricles have increased in size since 09/18/2016. The temporal horns are now mildly dilated. There may be new transependymal edema, of uncertain as there is extensive underlying chronic  cerebral white matter hypodensity from previous whole brain radiation. No intracranial mass effect or discrete mass lesion is identified. Basilar cisterns remain patent. Vascular: Calcified atherosclerosis at the skull base. No suspicious intracranial vascular hyperdensity. Skull: Stable visualized osseous structures. Hyperostosis of the calvarium. An 8-9 mm round ground-glass density area in the left frontal bone on series 202, image 19 is stable over the prior studies and probably is  a treated bone metastasis. No acute or suspicious osseous lesions identified. Sinuses/Orbits: Unchanged left, and trace right, mastoid effusions. Visualized paranasal sinuses are stable and well pneumatized. Other: Visualized orbits and scalp soft tissues are within normal limits. IMPRESSION: 1. Acute ventriculomegaly since 09/18/2016 of unclear etiology. Evidence of new transependymal edema, but no obstructing lesion or new brain mass identified on noncontrast CT. Query infectious ventriculitis. Repeat brain MRI without and with contrast recommended. 2. Sequelae of prior whole brain radiation. 3. 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 Electronically Signed: By: Genevie Ann M.D. On: 09/22/2016 09:20   Mr Jeri Cos IE Contrast  Result Date: 09/22/2016 CLINICAL DATA:  Altered mental status. EXAM: MRI HEAD WITHOUT AND WITH CONTRAST TECHNIQUE: Multiplanar, multiecho pulse sequences of the brain and surrounding structures were obtained without and with intravenous contrast. CONTRAST:  33mL MULTIHANCE GADOBENATE DIMEGLUMINE 529 MG/ML IV SOLN COMPARISON:  Head CT 09/22/2016 and MRI 09/18/2016 FINDINGS: Multiple sequences are mildly motion degraded. Brain: There is a thin curvilinear region of restricted diffusion in the midline along the splenium of the corpus callosum. No restricted diffusion is seen elsewhere. No mass, midline shift, or extra-axial fluid collection is  identified. As described on today's earlier head CT, there is mild lateral and third ventriculomegaly, including dilatation of the temporal horns. This is new from the recent prior MRI. The fourth ventricle is not dilated. There is confluent T2 hyperintensity throughout the periventricular white matter which has increased, most notably in the temporal lobes suggesting transependymal CSF flow superimposed on chronic white matter changes likely related to prior radiation therapy. No abnormal enhancement is identified. A few punctate foci of susceptibility artifact in the atria/occipital horns of the lateral ventricles are new and compatible with intraventricular blood products. More prominent susceptibility artifact as well as a 1 cm nodular focus of T2 hyperintensity are present posteriorly in the third ventricle, also favored to reflect blood products. Additional susceptibility compatible with blood products is present in the cerebral aqueduct and fourth ventricle. There is also evidence of small volume subarachnoid hemorrhage along the multiple superior cerebellar folia. Vascular: Major intracranial vascular flow voids are preserved, with the right vertebral artery being dominant. Skull and upper cervical spine: 9 mm left frontal skull lesion as described on CT. Sinuses/Orbits: Unremarkable orbits. Clear sinuses. Large left and trace right mastoid effusions. Other: None. IMPRESSION: 1. New, relatively small volume blood products in the lateral, third, and fourth ventricles with small volume posterior fossa subarachnoid hemorrhage. This is most notable at the level of the posterior third ventricle and proximal aqueduct resulting in mild obstructive hydrocephalus. 2. Small volume restricted diffusion in the splenium of the corpus callosum. This finding is often transient and can be seen in association with subarachnoid hemorrhage, seizure activity, drug toxicity (including chemotherapy agents), infection, and  metabolic disturbances. 3. Increased cerebral white matter T2 signal abnormality compatible with acute transependymal CSF flow superimposed on chronic radiation changes. 4. No enhancing lesions to clearly indicate intracranial metastatic disease. Critical Value/emergent results were called by telephone at the time of interpretation on 09/22/2016 at 1:28 pm to Dr. Joette Catching , who verbally acknowledged these results. Electronically Signed   By: Logan Bores M.D.   On: 09/22/2016 13:31        Scheduled Meds: . anastrozole  1 mg Oral Daily  . B-complex with vitamin C  1 tablet Oral Daily  . cephALEXin  500 mg Oral Q8H  . cholecalciferol  1,000 Units  Oral Daily  . docusate sodium  100 mg Oral BID  . magnesium oxide  400 mg Oral Daily  . multivitamins with iron  1 tablet Oral Daily  . sodium chloride flush  3 mL Intravenous Q12H   Continuous Infusions:   LOS: 5 days    Time spent: 35 mins     Dharma Pare Arsenio Loader, MD [facility] Pager [phone]   If 7PM-7AM, please contact night-coverage [email or web address] Password [email or web address] 09/23/2016, 10:36 AM

## 2016-09-23 NOTE — Progress Notes (Signed)
Patient ID: Linda [name], female   DOB: Sep 14, 1945, 71 y.o.   MRN: [id number] BP (!) 135/55   Pulse 93   Temp 99.8 F (37.7 C) (Oral)   Resp (!) 22   Ht 5\' 9"  (1.753 m)   Wt 97.4 kg (214 lb 11.7 oz)   SpO2 96%   BMI 31.71 kg/m  Alert, oriented to person, not to place, situation, time Follows all commands perrl, full eom Moving all extremities No drift on exam Not at all clear what cause of mental status changes were caused by, but ventriculomegaly does not appear to be a causal agent.  Will repeat cranial imaging later this week.

## 2016-09-23 NOTE — Progress Notes (Addendum)
Physical Therapy Treatment Patient Details Name: Linda [name] MRN: [id number] DOB: Nov 01, 1945 Today's Date: 09/23/2016    History of Present Illness Pt is a 71 y/o female admitted after having increased frequency of falls with R UE pain and AMS. Pt found to have an acute PE with R heart strain, heparin drip initiated on 4/5. Pt negative for an acute CVA/TIA. PMH including but not limited to CHF, PVD, HTN and breast cancer with mets to brain and lungs s/p bilateral mastectomy in 2013.  Patient with decreased responsiveness and concern for acute hydrocephalus, but improved without intervention.    PT Comments    Patient with increased assist for bed mobility and transfers over last session.  Somewhat limited by pain in R hip due to hematoma from fall.  Discussed downgrading recommendations to SNF, but pt and family want home with HHPT.  She remains at risk for falls and needing more assist after recent period of unresponsiveness with concern for acute hydrocephalus.  Will follow up to progress as tolerated and ensure safety for d/c home.    Follow Up Recommendations  Home health PT;Supervision/Assistance - 24 hour     Equipment Recommendations  None recommended by PT    Recommendations for Other Services       Precautions / Restrictions Precautions Precautions: Fall    Mobility  Bed Mobility Overal bed mobility: Needs Assistance Bed Mobility: Supine to Sit     Supine to sit: Mod assist;HOB elevated     General bed mobility comments: assist to guide legs off bed and scoot to EOB  Transfers Overall transfer level: Needs assistance Equipment used: Rolling walker (2 wheeled) Transfers: Sit to/from Stand Sit to Stand: Mod assist;+2 physical assistance         General transfer comment: lifting assist from EOB  Ambulation/Gait Ambulation/Gait assistance: Min assist Ambulation Distance (Feet): 100 Feet Assistive device: Rolling walker (2 wheeled) Gait Pattern/deviations:  Step-through pattern;Decreased stride length;Trunk flexed     General Gait Details: slow pace, some difficulty with turning walker and pt picked up walker to turn, cues to stop and move walker, then feet   Stairs            Wheelchair Mobility    Modified Rankin (Stroke Patients Only)       Balance Overall balance assessment: Needs assistance   Sitting balance-Leahy Scale: Good     Standing balance support: Bilateral upper extremity supported Standing balance-Leahy Scale: Poor Standing balance comment: UE support needed for balance                            Cognition Arousal/Alertness: Awake/alert Behavior During Therapy: WFL for tasks assessed/performed Overall Cognitive Status: Within Functional Limits for tasks assessed                                        Exercises      General Comments General comments (skin integrity, edema, etc.): son in room and reports his wife at home with their infant child and can supervise patient      Pertinent Vitals/Pain Faces Pain Scale: Hurts whole lot Pain Location: R buttocks Pain Descriptors / Indicators: Discomfort;Sore Pain Intervention(s): Monitored during session;Repositioned    Home Living                      Prior Function  PT Goals (current goals can now be found in the care plan section) Progress towards PT goals: Not progressing toward goals - comment (decline in mobility over last tx with period of unresponsiveness/concern for bleed)    Frequency    Min 3X/week      PT Plan Current plan remains appropriate    Co-evaluation             End of Session Equipment Utilized During Treatment: Gait belt Activity Tolerance: Patient tolerated treatment well Patient left: in chair;with call bell/phone within reach;with nursing/sitter in room;with family/visitor present   PT Visit Diagnosis: Other abnormalities of gait and mobility (R26.89);Muscle  weakness (generalized) (M62.81)     Time: 7209-1980 PT Time Calculation (min) (ACUTE ONLY): 33 min  Charges:  $Gait Training: 8-22 mins $Therapeutic Activity: 8-22 mins                    G CodesMagda [name], [place] [phone] 09/23/2016    Linda [name] 09/23/2016, 10:29 AM

## 2016-09-24 ENCOUNTER — Inpatient Hospital Stay (HOSPITAL_COMMUNITY): Payer: Medicare Other

## 2016-09-24 LAB — GLUCOSE, CAPILLARY
GLUCOSE-CAPILLARY: 105 mg/dL — AB (ref 65–99)
GLUCOSE-CAPILLARY: 112 mg/dL — AB (ref 65–99)
GLUCOSE-CAPILLARY: 92 mg/dL (ref 65–99)
Glucose-Capillary: 104 mg/dL — ABNORMAL HIGH (ref 65–99)
Glucose-Capillary: 92 mg/dL (ref 65–99)
Glucose-Capillary: 148 mg/dL — ABNORMAL HIGH (ref 65–99)

## 2016-09-24 LAB — CBC
HCT: 26.2 % — ABNORMAL LOW (ref 36.0–46.0)
HEMATOCRIT: 24.1 % — AB (ref 36.0–46.0)
Hemoglobin: 7.7 g/dL — ABNORMAL LOW (ref 12.0–15.0)
Hemoglobin: 8.2 g/dL — ABNORMAL LOW (ref 12.0–15.0)
MCH: 29.8 pg (ref 26.0–34.0)
MCH: 29.3 pg (ref 26.0–34.0)
MCHC: 32 g/dL (ref 30.0–36.0)
MCHC: 31.3 g/dL (ref 30.0–36.0)
MCV: 93.4 fL (ref 78.0–100.0)
MCV: 93.6 fL (ref 78.0–100.0)
PLATELETS: 169 10*3/uL (ref 150–400)
PLATELETS: 180 10*3/uL (ref 150–400)
RBC: 2.58 MIL/uL — ABNORMAL LOW (ref 3.87–5.11)
RBC: 2.8 MIL/uL — AB (ref 3.87–5.11)
RDW: 18.3 % — AB (ref 11.5–15.5)
RDW: 18.4 % — ABNORMAL HIGH (ref 11.5–15.5)
WBC: 7.4 10*3/uL (ref 4.0–10.5)
WBC: 8.1 10*3/uL (ref 4.0–10.5)

## 2016-09-24 LAB — BASIC METABOLIC PANEL
Anion gap: 7 (ref 5–15)
BUN: 16 mg/dL (ref 6–20)
CALCIUM: 8.7 mg/dL — AB (ref 8.9–10.3)
CO2: 28 mmol/L (ref 22–32)
Chloride: 107 mmol/L (ref 101–111)
Creatinine, Ser: 0.42 mg/dL — ABNORMAL LOW (ref 0.44–1.00)
GFR calc Af Amer: >60 mL/min (ref >60)
GFR calc non Af Amer: >60 mL/min (ref >60)
GLUCOSE: 95 mg/dL (ref 65–99)
Potassium: 3.5 mmol/L (ref 3.5–5.1)
Sodium: 142 mmol/L (ref 135–145)

## 2016-09-24 LAB — MAGNESIUM: Magnesium: 2.1 mg/dL (ref 1.7–2.4)

## 2016-09-24 LAB — HEPARIN LEVEL (UNFRACTIONATED): Heparin Unfractionated: 0.54 IU/mL (ref 0.30–0.70)

## 2016-09-24 MED ORDER — POLYETHYLENE GLYCOL 3350 17 G PO PACK
17.0000 g | PACK | Freq: Every day | ORAL | Status: DC
  Administered 2016-09-24 – 2016-09-25 (×2): 17 g via ORAL
  Filled 2016-09-24 (×3): qty 1

## 2016-09-24 MED ORDER — SPIRONOLACTONE 25 MG PO TABS
12.5000 mg | ORAL_TABLET | Freq: Every day | ORAL | Status: DC
  Administered 2016-09-24 – 2016-09-27 (×4): 12.5 mg via ORAL
  Filled 2016-09-24 (×4): qty 1

## 2016-09-24 MED ORDER — HEPARIN (PORCINE) IN NACL 100-0.45 UNIT/ML-% IJ SOLN
950.0000 [IU]/h | INTRAMUSCULAR | Status: DC
  Administered 2016-09-24 – 2016-09-25 (×2): 900 [IU]/h via INTRAVENOUS
  Filled 2016-09-24 (×4): qty 250

## 2016-09-24 MED ORDER — FUROSEMIDE 20 MG PO TABS
20.0000 mg | ORAL_TABLET | Freq: Every day | ORAL | Status: DC
  Administered 2016-09-24 – 2016-09-27 (×4): 20 mg via ORAL
  Filled 2016-09-24 (×4): qty 1

## 2016-09-24 NOTE — Progress Notes (Signed)
PT Cancellation Note  Patient Details Name: KELSEI DEFINO MRN: [id number] DOB: [birth year]   Cancelled Treatment:    Reason Eval/Treat Not Completed: Medical issues which prohibited therapy Pt with acute PE and just restarted on IV Heparin today, 4/11 at 13:03 pm. Will follow up once therapeutic and safe for mobility.    Marguarite Arbour A Levent Kornegay 09/24/2016, 3:22 PM [place]ray [place], [place], DPT [phone]

## 2016-09-24 NOTE — Progress Notes (Signed)
[place] TEAM 1 - Stepdown/ICU TEAM  KIWANNA SPRAKER  MVE:[id number] DOB: Aug 10, 1945 DOA: 09/17/2016 PCP: No PCP Per Patient    Brief Narrative:  71 year old female with history of breast cancer s/p B mastectomys, XRT, and currently on chemotherapy, hypertension, GERD, peripheral vascular disease, diastolic CHF, and a pericardial effusion who came to the ER with left arm swelling, as well as altered mental status.  CT of the head suggested focal hypoattenuation within the left cerebellum concerning for possible acute ischemic stroke. Neurology was consulted and MRI was negative for stroke or any metastatic disease in her brain. CT of the chest revealed pulmonary embolism with evidence of right-sided heart strain. She was started on heparin drip and then transitioned to Lovenox 1 mg-kilogram every 12 hours as needed.  Subjective: The pt is alert and oriented at the time of my visit.  She denies ha, n/v, abdom pain, or sob.  She does not remember the events of 09/22/16.    Assessment & Plan:  Altered mental status - encephalopathy - acute hydrocephalus  -present at time of admit, then recurred 4/9 -CT head at admission showed questionable acute ischemia therefore MRI of the brain was done which was negative for any acute pathology including any metastatic disease -repeat CT head 4/9 negative for ICH but noted acutely enlarged ventricles > repeat MRI confirmed acute hydrocephalus but etiology was not clear (Radiology felt there was evidence of blood products in ventricular system, but NS did not feel this was the case)  -EEG negative -pt has again recovered fully w/o specific intervention  -NS following - no indication for intervention at this time -LP was being considered, but I will cancel so as not to delay resumption of anticoag as I feel it will likely be low yield w/ pt now fully neurologically recovered   Submassive pulmonary embolism with right heart strain -heparin drip transitioned to  Lovenox but then anticoag stopped on 4/9 due to concerns for ICH -Upper extremity and lower extremity Dopplers negative for DVT -resume anticoag today w/ IV heparin w/ plan to transition back to lovenox if remains stable on heparin for 24hrs   ?Bacteremia - 1 of 2 blood cx + for Staph lugdunensis -appears to most likely be a contaminant - follow clinically   Anemia - R buttock hematoma  -likely exacerbated by anticoag having suffered a fall onto the buttocks at home prior to admit  -s/p 2U PRBC 4/8  -resume anticoag w/ IV heparin today and follow Hgb in a serial fashion   Left upper extremity swelling/redness - cellulitis v/s lymphedema -Dopplers negative -Keflex continued for 5 days -lymphedema sleeve  -elevate arm   Chronic diastolic congestive heart failure -no evidence of signif volume overload at this time   Hypertension -BP poorly controlled - adjust tx and follow   GERD -cont Protonix   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 Jana Hakim - tentative appts on 4/16 and 10/09/16  DVT prophylaxis: IV heparin Code Status: FULL CODE Family Communication: no family present at time of exam  Disposition Plan: should be safe for SDU 4/12 if remains stable w/ resumption of IV heparin   Consultants:  Oncology  Neurosurgery   Antimicrobials:  Keflex 4/5 > 4/9  Objective: Blood pressure (!) 151/59, pulse 77, temperature 98.5 F (36.9 C), resp. rate 15, height 5\' 9"  (1.753 m), weight 97.4 kg (214 lb 11.7 oz), SpO2 97 %.  Intake/Output Summary (Last 24 hours) at 09/24/16 1053 Last data filed at 09/24/16 0900  Gross per 24 hour  Intake              720 ml  Output              700 ml  Net               20 ml   Filed Weights   09/21/16 0603 09/22/16 0602 09/22/16 1456  Weight: 94.8 kg (209 lb) 87.4 kg (192 lb 10.9 oz) 97.4 kg (214 lb 11.7 oz)     Examination: General: No acute respiratory distress - alert and conversant  Lungs: Clear to auscultation bilaterally - no wheezing  Cardiovascular: Regular rate and rhythm without murmur Abdomen: Nontender, nondistended, soft, bowel sounds positive, no rebound Extremities: No significant cyanosis, clubbing, or edema bilateral lower extremities - L UE w/ 1+ edema - 2+ DP pulses B LE continue Neuro:  No facial asymmetry - tongue midline - opens eyes equally - no drooping of mouth - pupils equal and reactive - 4/5 strength B U&LE - no babinski   CBC:  Recent Labs Lab 09/20/16 2009 09/21/16 0457 09/22/16 0419 09/23/16 1157 09/24/16 0644  WBC 7.0 7.0 8.9 10.2 7.4  HGB 7.3* 7.0* 9.0* 8.4* 7.7*  HCT 22.4* 21.2* 27.3* 25.8* 24.1*  MCV 92.9 93.0 90.1 92.1 93.4  PLT 127* 141* 165 166 335   Basic Metabolic Panel:  Recent Labs Lab 09/17/16 1842 09/18/16 0345 09/22/16 2330 09/23/16 1157 09/24/16 0644  NA 134* 138 142 141 142  K 3.4* 3.4* 3.4* 3.6 3.5  CL 99* 104 108 106 107  CO2 26 27 28 26 28   GLUCOSE 164* 113* 124* 110* 95  BUN 18 15 14 15 16   CREATININE 0.72 0.60 0.46 0.47 0.42*  CALCIUM 9.1 8.9 8.7* 8.9 8.7*  MG  --   --   --   --  2.1   GFR: Estimated Creatinine Clearance: 81.3 mL/min (A) (by C-G formula based on SCr of 0.42 mg/dL (L)).  Liver Function Tests:  Recent Labs Lab 09/22/16 1207  AST 67*  ALT 38  ALKPHOS 108  BILITOT 1.5*  PROT 6.5  ALBUMIN 2.6*    Recent Labs Lab 09/22/16 1207  AMMONIA 18    Coagulation Profile: No results for input(s): INR, PROTIME in the last 168 hours.  Cardiac Enzymes:  Recent Labs Lab 09/17/16 1842  TROPONINI <0.03    HbA1C: Hgb A1c MFr Bld  Date/Time Value Ref Range Status  09/18/2016 03:45 AM 5.4 4.8 - 5.6 % Final    Comment:    (NOTE)         Pre-diabetes: 5.7 - 6.4         Diabetes: >6.4         Glycemic control for adults with diabetes: <7.0     CBG:  Recent Labs Lab 09/23/16 1542  09/23/16 2011 09/24/16 0026 09/24/16 0427 09/24/16 0814  GLUCAP 129* 119* 112* 104* 92    Recent Results (from the past 240 hour(s))  Blood culture (routine x 2)     Status: Abnormal   Collection Time: 09/17/16  9:10 PM  Result Value Ref Range Status   Specimen Description BLOOD RIGHT ANTECUBITAL  Final   Special Requests   Final    BOTTLES DRAWN AEROBIC AND ANAEROBIC Blood Culture adequate volume   Culture  Setup Time   Final    GRAM POSITIVE COCCI IN CLUSTERS IN  BOTH AEROBIC AND ANAEROBIC BOTTLES CRITICAL RESULT CALLED TO, READ BACK BY AND VERIFIED WITH: C BALL,PHARMD AT 1759 09/20/16 BY L BENFIELD    Culture STAPHYLOCOCCUS LUGDUNENSIS (A)  Final   Report Status 09/22/2016 FINAL  Final   Organism ID, Bacteria STAPHYLOCOCCUS LUGDUNENSIS  Final      Susceptibility   Staphylococcus lugdunensis - MIC*    CIPROFLOXACIN <=0.5 SENSITIVE Sensitive     ERYTHROMYCIN <=0.25 SENSITIVE Sensitive     GENTAMICIN <=0.5 SENSITIVE Sensitive     OXACILLIN 1 SENSITIVE Sensitive     TETRACYCLINE <=1 SENSITIVE Sensitive     VANCOMYCIN <=0.5 SENSITIVE Sensitive     TRIMETH/SULFA <=10 SENSITIVE Sensitive     CLINDAMYCIN <=0.25 SENSITIVE Sensitive     RIFAMPIN <=0.5 SENSITIVE Sensitive     Inducible Clindamycin NEGATIVE Sensitive     * STAPHYLOCOCCUS LUGDUNENSIS  Blood Culture ID Panel (Reflexed)     Status: Abnormal   Collection Time: 09/17/16  9:10 PM  Result Value Ref Range Status   Enterococcus species NOT DETECTED NOT DETECTED Final   Listeria monocytogenes NOT DETECTED NOT DETECTED Final   Staphylococcus species DETECTED (A) NOT DETECTED Final    Comment: Methicillin (oxacillin) susceptible coagulase negative staphylococcus. Possible blood culture contaminant (unless isolated from more than one blood culture draw or clinical case suggests pathogenicity). No antibiotic treatment is indicated for blood  culture contaminants. CRITICAL RESULT CALLED TO, READ BACK BY AND VERIFIED WITH: C  BALL,PHARMD AT 1759 09/20/16 BY L BENFIELD    Staphylococcus aureus NOT DETECTED NOT DETECTED Final   Methicillin resistance NOT DETECTED NOT DETECTED Final   Streptococcus species NOT DETECTED NOT DETECTED Final   Streptococcus agalactiae NOT DETECTED NOT DETECTED Final   Streptococcus pneumoniae NOT DETECTED NOT DETECTED Final   Streptococcus pyogenes NOT DETECTED NOT DETECTED Final   Acinetobacter baumannii NOT DETECTED NOT DETECTED Final   Enterobacteriaceae species NOT DETECTED NOT DETECTED Final   Enterobacter cloacae complex NOT DETECTED NOT DETECTED Final   Escherichia coli NOT DETECTED NOT DETECTED Final   Klebsiella oxytoca NOT DETECTED NOT DETECTED Final   Klebsiella pneumoniae NOT DETECTED NOT DETECTED Final   Proteus species NOT DETECTED NOT DETECTED Final   Serratia marcescens NOT DETECTED NOT DETECTED Final   Haemophilus influenzae NOT DETECTED NOT DETECTED Final   Neisseria meningitidis NOT DETECTED NOT DETECTED Final   Pseudomonas aeruginosa NOT DETECTED NOT DETECTED Final   Candida albicans NOT DETECTED NOT DETECTED Final   Candida glabrata NOT DETECTED NOT DETECTED Final   Candida krusei NOT DETECTED NOT DETECTED Final   Candida parapsilosis NOT DETECTED NOT DETECTED Final   Candida tropicalis NOT DETECTED NOT DETECTED Final  Blood culture (routine x 2)     Status: None   Collection Time: 09/17/16  9:15 PM  Result Value Ref Range Status   Specimen Description BLOOD RIGHT HAND  Final   Special Requests   Final    BOTTLES DRAWN AEROBIC AND ANAEROBIC Blood Culture adequate volume   Culture NO GROWTH 6 DAYS  Final   Report Status 09/23/2016 FINAL  Final  MRSA PCR Screening     Status: None   Collection Time: 09/18/16 10:20 AM  Result Value Ref Range Status   MRSA by PCR NEGATIVE NEGATIVE Final    Comment:        The GeneXpert MRSA Assay (FDA approved for NASAL specimens only), is one component of a comprehensive MRSA colonization surveillance program. It is  not intended to diagnose MRSA infection nor  to guide or monitor treatment for MRSA infections.      Scheduled Meds: . anastrozole  1 mg Oral Daily  . B-complex with vitamin C  1 tablet Oral Daily  . cholecalciferol  1,000 Units Oral Daily  . docusate sodium  100 mg Oral BID  . magnesium oxide  400 mg Oral Daily  . multivitamins with iron  1 tablet Oral Daily  . polyethylene glycol  17 g Oral Daily  . sodium chloride flush  3 mL Intravenous Q12H     LOS: 6 days   Cherene Altes, MD [facility] Office  [phone] Pager - Text Page per Amion as per below:  On-Call/Text Page:      [email or web address]      password [email or web address]  If 7PM-7AM, please contact night-coverage [email or web address] Password [email or web address] 09/24/2016, 10:53 AM

## 2016-09-24 NOTE — Progress Notes (Signed)
Patient ID: Linda [name], female   DOB: 12-02-1945, 71 y.o.   MRN: [id number] BP (!) 182/75   Pulse 81   Temp 98.8 F (37.1 C)   Resp 18   Ht 5\' 9"  (1.753 m)   Wt 97.4 kg (214 lb 11.7 oz)   SpO2 97%   BMI 31.71 kg/m  Alert and oriented, following commands Moving all extremities perrl, full eom Continues to improve. Will repeat CT on Friday.

## 2016-09-24 NOTE — Progress Notes (Signed)
Linda [name]   DOB:[birth year]   ZS#:[id number]   QGB#:[id number]  Subjective:  Linda [name] knows my name and where she is;l year was "2020." She tells me "they have been doing a lot of tests," but unable to tell me results. Denies headaches, N/V, or trouble swallowing. No BM "for a while." States she is "fine" and can get OOB and ambulate w/o difficulty.... No family in room  Objective: [name] examined in bed Vitals:   09/24/16 0600 09/24/16 0700  BP: (!) 140/52 (!) 146/60  Pulse: 74 75  Resp: (!) 21 19  Temp:      Body mass index is 31.71 kg/m.  Intake/Output Summary (Last 24 hours) at 09/24/16 0727 Last data filed at 09/24/16 0600  Gross per 24 hour  Intake              840 ml  Output              700 ml  Net              140 ml     No cervical or supraclavicular adenopathy  Lungs no rales or wheezes--auscultated anterolaterally  Heart regular rate and rhythm  Abdomen soft, +BS  Neuro nonfocal  Breast exam: Deferred  CBG (last 3)   Recent Labs  09/23/16 2011 09/24/16 0026 09/24/16 0427  GLUCAP 119* 112* 104*     Labs:  Lab Results  Component Value Date   WBC 10.2 09/23/2016   HGB 8.4 (L) 09/23/2016   HCT 25.8 (L) 09/23/2016   MCV 92.1 09/23/2016   PLT 166 09/23/2016   NEUTROABS 2.4 09/10/2016    _0 @  Urine Studies No results for input(s): UHGB, CRYS in the last 72 hours.  Invalid input(s): UACOL, UAPR, USPG, UPH, UTP, UGL, UKET, UBIL, UNIT, UROB, [place], UEPI, UWBC, Duwayne Heck [place], [place]  Basic Metabolic Panel:  Recent Labs Lab 09/17/16 1842 09/18/16 0345 09/22/16 2330 09/23/16 1157 09/24/16 0644  NA 134* 138 142 141 142  K 3.4* 3.4* 3.4* 3.6 3.5  CL 99* 104 108 106 107  CO2 _1 GLUCOSE 164* 113* 124* 110* 95  BUN _2 CREATININE 0.72 0.60 0.46 0.47 0.42*  CALCIUM 9.1 8.9 8.7* 8.9 8.7*  MG  --   --   --   --  2.1   GFR Estimated Creatinine Clearance: 81.3 mL/min (A) (by C-G formula  based on SCr of 0.42 mg/dL (L)). Liver Function Tests:  Recent Labs Lab 09/22/16 1207  AST 67*  ALT 38  ALKPHOS 108  BILITOT 1.5*  PROT 6.5  ALBUMIN 2.6*   No results for input(s): LIPASE, AMYLASE in the last 168 hours.  Recent Labs Lab 09/22/16 1207  AMMONIA 18   Coagulation profile No results for input(s): INR, PROTIME in the last 168 hours.  CBC:  Recent Labs Lab 09/20/16 0315 09/20/16 2009 09/21/16 0457 09/22/16 0419 09/23/16 1157  WBC 6.3 7.0 7.0 8.9 10.2  HGB 7.4* 7.3* 7.0* 9.0* 8.4*  HCT 22.7* 22.4* 21.2* 27.3* 25.8*  MCV 93.4 92.9 93.0 90.1 92.1  PLT 114* 127* 141* 165 166   Cardiac Enzymes:  Recent Labs Lab 09/17/16 1842  TROPONINI <0.03   BNP: Invalid input(s): POCBNP CBG:  Recent Labs Lab 09/23/16 1152 09/23/16 1542 09/23/16 2011 09/24/16 0026 09/24/16 0427  GLUCAP 98 129* 119* 112* 104*   D-Dimer No results for input(s): DDIMER in the last 72 hours. Hgb A1c  No results for input(s): HGBA1C in the last 72 hours. Lipid Profile No results for input(s): CHOL, HDL, LDLCALC, TRIG, CHOLHDL, LDLDIRECT in the last 72 hours. Thyroid function studies  Recent Labs  09/23/16 1151  TSH 3.331   Anemia work up  Recent Labs  09/22/16 1207  VITAMINB12 2,204*  FOLATE 38.3  FERRITIN 804*  TIBC 280  IRON 29  RETICCTPCT 2.6   Microbiology Recent Results (from the past 240 hour(s))  Blood culture (routine x 2)     Status: Abnormal   Collection Time: 09/17/16  9:10 PM  Result Value Ref Range Status   Specimen Description BLOOD RIGHT ANTECUBITAL  Final   Special Requests   Final    BOTTLES DRAWN AEROBIC AND ANAEROBIC Blood Culture adequate volume   Culture  Setup Time   Final    GRAM POSITIVE COCCI IN CLUSTERS IN BOTH AEROBIC AND ANAEROBIC BOTTLES CRITICAL RESULT CALLED TO, READ BACK BY AND VERIFIED WITH: C BALL,PHARMD AT 1759 09/20/16 BY L BENFIELD    Culture STAPHYLOCOCCUS LUGDUNENSIS (A)  Final   Report Status 09/22/2016 FINAL  Final    Organism ID, Bacteria STAPHYLOCOCCUS LUGDUNENSIS  Final      Susceptibility   Staphylococcus lugdunensis - MIC*    CIPROFLOXACIN <=0.5 SENSITIVE Sensitive     ERYTHROMYCIN <=0.25 SENSITIVE Sensitive     GENTAMICIN <=0.5 SENSITIVE Sensitive     OXACILLIN 1 SENSITIVE Sensitive     TETRACYCLINE <=1 SENSITIVE Sensitive     VANCOMYCIN <=0.5 SENSITIVE Sensitive     TRIMETH/SULFA <=10 SENSITIVE Sensitive     CLINDAMYCIN <=0.25 SENSITIVE Sensitive     RIFAMPIN <=0.5 SENSITIVE Sensitive     Inducible Clindamycin NEGATIVE Sensitive     * STAPHYLOCOCCUS LUGDUNENSIS  Blood Culture ID Panel (Reflexed)     Status: Abnormal   Collection Time: 09/17/16  9:10 PM  Result Value Ref Range Status   Enterococcus species NOT DETECTED NOT DETECTED Final   Listeria monocytogenes NOT DETECTED NOT DETECTED Final   Staphylococcus species DETECTED (A) NOT DETECTED Final    Comment: Methicillin (oxacillin) susceptible coagulase negative staphylococcus. Possible blood culture contaminant (unless isolated from more than one blood culture draw or clinical case suggests pathogenicity). No antibiotic treatment is indicated for blood  culture contaminants. CRITICAL RESULT CALLED TO, READ BACK BY AND VERIFIED WITH: C BALL,PHARMD AT 1759 09/20/16 BY L BENFIELD    Staphylococcus aureus NOT DETECTED NOT DETECTED Final   Methicillin resistance NOT DETECTED NOT DETECTED Final   Streptococcus species NOT DETECTED NOT DETECTED Final   Streptococcus agalactiae NOT DETECTED NOT DETECTED Final   Streptococcus pneumoniae NOT DETECTED NOT DETECTED Final   Streptococcus pyogenes NOT DETECTED NOT DETECTED Final   Acinetobacter baumannii NOT DETECTED NOT DETECTED Final   Enterobacteriaceae species NOT DETECTED NOT DETECTED Final   Enterobacter cloacae complex NOT DETECTED NOT DETECTED Final   Escherichia coli NOT DETECTED NOT DETECTED Final   Klebsiella oxytoca NOT DETECTED NOT DETECTED Final   Klebsiella pneumoniae NOT DETECTED  NOT DETECTED Final   Proteus species NOT DETECTED NOT DETECTED Final   Serratia marcescens NOT DETECTED NOT DETECTED Final   Haemophilus influenzae NOT DETECTED NOT DETECTED Final   Neisseria meningitidis NOT DETECTED NOT DETECTED Final   Pseudomonas aeruginosa NOT DETECTED NOT DETECTED Final   Candida albicans NOT DETECTED NOT DETECTED Final   Candida glabrata NOT DETECTED NOT DETECTED Final   Candida krusei NOT DETECTED NOT DETECTED Final   Candida parapsilosis NOT DETECTED NOT DETECTED Final  Candida tropicalis NOT DETECTED NOT DETECTED Final  Blood culture (routine x 2)     Status: None   Collection Time: 09/17/16  9:15 PM  Result Value Ref Range Status   Specimen Description BLOOD RIGHT HAND  Final   Special Requests   Final    BOTTLES DRAWN AEROBIC AND ANAEROBIC Blood Culture adequate volume   Culture NO GROWTH 6 DAYS  Final   Report Status 09/23/2016 FINAL  Final  MRSA PCR Screening     Status: None   Collection Time: 09/18/16 10:20 AM  Result Value Ref Range Status   MRSA by PCR NEGATIVE NEGATIVE Final    Comment:        The GeneXpert MRSA Assay (FDA approved for NASAL specimens only), is one component of a comprehensive MRSA colonization surveillance program. It is not intended to diagnose MRSA infection nor to guide or monitor treatment for MRSA infections.       Studies:  Ct Abdomen Pelvis Wo Contrast  Result Date: 09/23/2016 CLINICAL DATA:  New mid 1 week ago status post fall. Large hematoma to right buttock. Right buttock pain. EXAM: CT ABDOMEN AND PELVIS WITHOUT CONTRAST TECHNIQUE: Multidetector CT imaging of the abdomen and pelvis was performed following the standard protocol without IV contrast. COMPARISON:  None. FINDINGS: Lower chest: No acute findings. Hepatobiliary: Gallstones noted within the gallbladder. No focal hepatic abnormality. Pancreas: No focal abnormality or ductal dilatation. Spleen: No focal abnormality.  Normal size. Adrenals/Urinary Tract:  No adrenal abnormality. No focal renal abnormality. No stones or hydronephrosis. Urinary bladder is unremarkable. Stomach/Bowel: Moderate stool burden in the colon. Stomach, large and small bowel grossly unremarkable. Vascular/Lymphatic: No evidence of aneurysm or adenopathy. Diffuse aortic calcifications. Reproductive: Uterus and adnexa unremarkable.  No mass. Other: No free fluid or free air. There is a soft tissue hematoma within the medial right buttock subcutaneous soft tissues, extending into the right perineum adjacent to the rectum. The hematoma measures 12.8 x 5.9 cm. Musculoskeletal: No acute bony abnormality. Degenerative changes in the lumbar spine. IMPRESSION: Large hematoma in the medial right buttock soft tissues extending into the right perineum. Moderate stool in the colon. Aortic atherosclerosis. Electronically Signed   By: Rolm Baptise M.D.   On: 09/23/2016 11:44   Ct Head Wo Contrast  Addendum Date: 09/22/2016   ADDENDUM REPORT: 09/22/2016 09:34 ADDENDUM: Study discussed by telephone with Dr. Joette Catching on 09/22/2016 at 09:32 . He advises the patient is afebrile, with no leukocytosis, but with worsening mental status. We discussed repeat MRI without and with contrast in hopes of identifying the cause of the ventricular enlargement. Electronically Signed   By: Genevie Ann M.D.   On: 09/22/2016 09:34   Result Date: 09/22/2016 CLINICAL DATA:  71 year old female diagnosed with acute pulmonary embolism on 09/17/2016. 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 EXAM: CT HEAD WITHOUT CONTRAST TECHNIQUE: Contiguous axial images were obtained from the base of the skull through the vertex without intravenous contrast. COMPARISON:  Brain MRI without and with contrast 09/18/2016, and earlier. FINDINGS: Brain: No acute intracranial hemorrhage identified. No cortically based acute infarct identified.  However, the lateral and third ventricles have increased in size since 09/18/2016. The temporal horns are now mildly dilated. There may be new transependymal edema, of uncertain as there is extensive underlying chronic cerebral white matter hypodensity from previous whole brain radiation. No intracranial mass effect or discrete mass lesion is  identified. Basilar cisterns remain patent. Vascular: Calcified atherosclerosis at the skull base. No suspicious intracranial vascular hyperdensity. Skull: Stable visualized osseous structures. Hyperostosis of the calvarium. An 8-9 mm round ground-glass density area in the left frontal bone on series 202, image 19 is stable over the prior studies and probably is a treated bone metastasis. No acute or suspicious osseous lesions identified. Sinuses/Orbits: Unchanged left, and trace right, mastoid effusions. Visualized paranasal sinuses are stable and well pneumatized. Other: Visualized orbits and scalp soft tissues are within normal limits. IMPRESSION: 1. Acute ventriculomegaly since 09/18/2016 of unclear etiology. Evidence of new transependymal edema, but no obstructing lesion or new brain mass identified on noncontrast CT. Query infectious ventriculitis. Repeat brain MRI without and with contrast recommended. 2. Sequelae of prior whole brain radiation. 3. 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 Electronically Signed: By: Genevie Ann M.D. On: 09/22/2016 09:20   Mr Jeri Cos YJ Contrast  Result Date: 09/22/2016 CLINICAL DATA:  Altered mental status. EXAM: MRI HEAD WITHOUT AND WITH CONTRAST TECHNIQUE: Multiplanar, multiecho pulse sequences of the brain and surrounding structures were obtained without and with intravenous contrast. CONTRAST:  84m MULTIHANCE GADOBENATE DIMEGLUMINE 529 MG/ML IV SOLN COMPARISON:  Head CT 09/22/2016 and MRI 09/18/2016 FINDINGS: Multiple sequences are mildly motion degraded. Brain: There  is a thin curvilinear region of restricted diffusion in the midline along the splenium of the corpus callosum. No restricted diffusion is seen elsewhere. No mass, midline shift, or extra-axial fluid collection is identified. As described on today's earlier head CT, there is mild lateral and third ventriculomegaly, including dilatation of the temporal horns. This is new from the recent prior MRI. The fourth ventricle is not dilated. There is confluent T2 hyperintensity throughout the periventricular white matter which has increased, most notably in the temporal lobes suggesting transependymal CSF flow superimposed on chronic white matter changes likely related to prior radiation therapy. No abnormal enhancement is identified. A few punctate foci of susceptibility artifact in the atria/occipital horns of the lateral ventricles are new and compatible with intraventricular blood products. More prominent susceptibility artifact as well as a 1 cm nodular focus of T2 hyperintensity are present posteriorly in the third ventricle, also favored to reflect blood products. Additional susceptibility compatible with blood products is present in the cerebral aqueduct and fourth ventricle. There is also evidence of small volume subarachnoid hemorrhage along the multiple superior cerebellar folia. Vascular: Major intracranial vascular flow voids are preserved, with the right vertebral artery being dominant. Skull and upper cervical spine: 9 mm left frontal skull lesion as described on CT. Sinuses/Orbits: Unremarkable orbits. Clear sinuses. Large left and trace right mastoid effusions. Other: None. IMPRESSION: 1. New, relatively small volume blood products in the lateral, third, and fourth ventricles with small volume posterior fossa subarachnoid hemorrhage. This is most notable at the level of the posterior third ventricle and proximal aqueduct resulting in mild obstructive hydrocephalus. 2. Small volume restricted diffusion in the  splenium of the corpus callosum. This finding is often transient and can be seen in association with subarachnoid hemorrhage, seizure activity, drug toxicity (including chemotherapy agents), infection, and metabolic disturbances. 3. Increased cerebral white matter T2 signal abnormality compatible with acute transependymal CSF flow superimposed on chronic radiation changes. 4. No enhancing lesions to clearly indicate intracranial metastatic disease. Critical Value/emergent results were called by telephone at the time of interpretation on 09/22/2016 at 1:28 pm to Dr. JJoette Catching, who verbally acknowledged these results. Electronically Signed  By: Logan Bores M.D.   On: 09/22/2016 13:31    Assessment: 71 y.o. [place] woman admitted 09/17/2016 with confusion, left upper extremity cellulitis, and bilateral segmental and subsegmental pulmonary emboli, with negative left upper extremity and bilateral lower extremity Dopplers 09/19/2011, large Right buttock hematoma from pre-admission fall. Her breast cancer history is as follows  (1)  status post bilateral breast biopsies 07/24/2011, showing,     (a) on the right, a clinical T2 N0 papillary/ductal breast cancer, estrogen and progesterone receptor positive, HER-2 negative, with an MIB-1 of 10%;    (b) on the left, a clinical T3 N1, stage IIIA invasive ductal carcinoma, grade 3, triple positive, with an MIB-1 of 60%.  (2)  Status post 4 dose dense cycles of doxorubicin/ cyclophosphamide followed by 4 dose dense cycles of paclitaxel and trastuzumab completed 12/09/2011  (3) the trastuzumab was continued for a total of one year (to 11/01/2012). Final echo on 11/04/2012 showing a well preserved ejection fraction of 55-60%.  (4) s/p bilateral mastectomies 01/21/2012 showing             (a) on the Right, an 8 mm invasive papillary carcinoma, grade 1, ypT1b ypN0             (b) on the Left, multiple microscopic foci of residual  Invasive ductal carcinoma  with evidence of dermal lymphatic involvement, pyT1a/T4 pyN0  (5)  Postmastectomy radiation, completed 04/15/2012  (6) Started anastrazole 04/16/2012; normal dexa scan 05/19/2014 at the [facility] 09/11/2014: brain, bones, left effusion (7) CT angiogram 09/11/2014 shows new left pericardial effusion and new mediastinal and hilar lymphadenopathy; there were no suspicious upper abdominal findings; Cytology from the left effusion 09/11/2014 ([id number]) shows malignant cells which are HER-2 positive  (8) Whole body bone scan on 09/25/14 showed metastatic foci throughout axial and appendicular skeleton. Areas of most potential concern include disease in the thoracic spine, disease in the right acetabulum, right superior pubic ramus and possibly proximal right femur, disease in the right humeral shaft and in both femoral shafts.  (8) Started trastuzumab/pertuzumab 09/26/2014, discontinued after 11/29/2014 dose, w progression             (a) T-DM1 started 01/02/2015, repeated every 21 days             (b) echocardiogram 09/02/2016 hows an ejection fraction of 60-65 %  (9) Brain MRI on 09/25/14 was positive for numerous small enhancing brain mets and calvarium/bone mets              (a) whole brain radiation on 10/09/14--10/27/2014             (b) brain MRI 01/07/2015 shows a complete clinical response             (c) REPEAT BRAIN mri 03/18/2015 SHOWS NO NEW LESIONS             (d) repeat brain MRI with and without contrast 06/29/2015 continues to show no recurrent lesions in the brain             (e) brain MRI 10/12/2015 shows no evidence of recurrent brain metastases. She does have a large left mastoid effusion with trace right mastoid fluid.             (f) brain MRI 02/12/2016 stable--no evidence of active disease             (g) brain MRI 05/19/2016 stable, no evidence of active disease  (10) started zolendronate 11/29/2014, repeated  every 12 weeks  11)  left  pleural effusion re-tapped 12/08/2014 (a) left chest tube placement 12/14/2014, removedd 12/21/2014  (12) large pericardial effusion per echo 12/13/2014  (a) pericardial window placement 12/14/2014; fluid is hemorrhagic; path negative  (13) Right-sided pulmonary emboli noted on CT scan 12/13/2014-- never anticoagulated             (a) no pulmonary emboli demonstrated on non-dedicated chest CT 04/13/2015  (14) bilateral segmental and subsegmental pulmonary emboli documented 09/17/2016, with negative left upper extremity and bilateral lower extremity Dopplers 09/18/2016  (a) unfractionated heparin started 09/17/2016, transitioned to Lovenox 09/19/2016  (b) held 09/22/2016 with further change in mental status, considering invasive procedures    Plan:  Reviewed brain MRI and abd/pelvis CT results-- there is no evidence of cancer recurrence or activity. Echo 09/20/2016 shows a strong EF. Accordingly from a breast cancer point of view the plan is to continue treatment as before:  once patient discharged and stable will resume T-DM1--she continues on anastrozole as inpatient (this antiestrogen has no procoagulant effects)  I note LP is planned-- cytology will be helpful; I expect it will be negative. Once planned LP done if no other procedures planned would consider prophylactic dose lovenox transitioning to full dose as overall condition (including hematoma status) allows.  Greatly appreciate neurosurgery's and Hospitalist's care of this patient. Will follow with you. She has appointments [facility] 04/16 and 04/26-- those can be easily changed as needed.   Chauncey Cruel, MD 09/24/2016  7:27 AM Medical Oncology and Hematology [facility] [address] [place], [place] [address] Tel. [phone]    Fax. [phone]

## 2016-09-24 NOTE — Progress Notes (Signed)
[place] for heparin Indication: pulmonary embolus  No Known Allergies  Patient Measurements: Height: 5\' 9"  (175.3 cm) Weight: 214 lb 11.7 oz (97.4 kg) IBW/kg (Calculated) : 66.2 Heparin Dosing Weight: 87kg  Vital Signs: Temp: 98.5 F (36.9 C) (04/11 0800) Temp Source: Oral (04/11 0400) BP: 151/59 (04/11 0800) Pulse Rate: 77 (04/11 0800)  Labs:  Recent Labs  09/22/16 0419 09/22/16 2330 09/23/16 1157 09/24/16 0644  HGB 9.0*  --  8.4* 7.7*  HCT 27.3*  --  25.8* 24.1*  PLT 165  --  166 169  CREATININE  --  0.46 0.47 0.42*    Estimated Creatinine Clearance: 81.3 mL/min (A) (by C-G formula based on SCr of 0.42 mg/dL (L)).   Medical History: Past Medical History:  Diagnosis Date  . Acute on chronic diastolic CHF (congestive heart failure) ([place])   . Acute pulmonary embolism ([place]) 12/13/2014  . Allergy   . Blood transfusion without reported diagnosis   . Breast cancer ([place]) 07/30/11 dx   Right  invasive ductal ca 7 0'clock,& left breast=invasive ductal ca  and dcis, left axilla nlymph node, metastatic ca  . Bronchitis    hx  . Cancer ([place]) 08-13-11   07-31-11-Dx. Bilarteral Breast cancer-left greater than rt.  . Cancer of central portion of left female breast ([place]) 08/01/2011  . Diabetes mellitus without complication ([place])   . GERD (gastroesophageal reflux disease)    doing well  . Hematuria, undiagnosed cause 08-13-11   Being evaluated by [facility] 08-14-11  . History of radiation therapy 02/20/12-04/15/12   left breast,total 60.4 Gy  . Hypertension   . Radiation-induced dermatitis 03/26/2012   Using radioplex cream, plus neosporin.   . S/P radiation therapy 10/09/14-10/27/14   whole brain 37.5Gy/47fx  . Seasonal allergies   . Seroma 02/04/12   right breast  200cc removed  erythema on right side  . Wears partial dentures    wears upper and lower partial    Medications:  Infusions:  . heparin      Assessment: 27 yof  with acute PE to resume anticoagulation today. Had been on hold due to mental status changes and gluteal hematoma. Will resume cautiously today at previously therapeutic rate. H/H is low 7.7/24.1, and platelets are WNL.  Goal of Therapy:  Heparin level 0.3-0.7 units/ml Monitor platelets by anticoagulation protocol: Yes   Plan:  Resume heparin gtt at 900 units/hr - NO BOLUS Check an 8 hr heparin level Daily heparin level and CBC Monitor closely for bleeding  Makari Portman, Rande Lawman 09/24/2016,11:58 AM

## 2016-09-24 NOTE — Progress Notes (Signed)
ANTICOAGULATION CONSULT NOTE - Follow Up Consult  Pharmacy Consult for Heparin  Indication: pulmonary embolus  No Known Allergies  Patient Measurements: Height: 5\' 9"  (175.3 cm) Weight: 214 lb 11.7 oz (97.4 kg) IBW/kg (Calculated) : 66.2  Vital Signs: Temp: 98.6 F (37 C) (04/11 2000) Temp Source: Oral (04/11 2000) BP: 148/60 (04/11 2200) Pulse Rate: 83 (04/11 2200)  Labs:  Recent Labs  09/22/16 2330 09/23/16 1157 09/24/16 0644 09/24/16 2217  HGB  --  8.4* 7.7* 8.2*  HCT  --  25.8* 24.1* 26.2*  PLT  --  166 169 180  HEPARINUNFRC  --   --   --  0.54  CREATININE 0.46 0.47 0.42*  --     Estimated Creatinine Clearance: 81.3 mL/min (A) (by C-G formula based on SCr of 0.42 mg/dL (L)).   Assessment: 71 y/o F with PE whose anti-coagulation was resumed today after being on hold for gluteal hematoma/mental status changes. Hgb low but stable x 2 days. Heparin level is therapeutic x 1 after re-start today.   Goal of Therapy:  Heparin level 0.3-0.7 units/ml Monitor platelets by anticoagulation protocol: Yes   Plan:  -Cont heparin 900 units/hr -HL with AM labs  Narda Bonds 09/24/2016,11:01 PM

## 2016-09-25 LAB — BASIC METABOLIC PANEL
ANION GAP: 7 (ref 5–15)
BUN: 15 mg/dL (ref 6–20)
CHLORIDE: 107 mmol/L (ref 101–111)
CO2: 25 mmol/L (ref 22–32)
Calcium: 8.5 mg/dL — ABNORMAL LOW (ref 8.9–10.3)
Creatinine, Ser: 0.47 mg/dL (ref 0.44–1.00)
GFR calc non Af Amer: >60 mL/min (ref >60)
Glucose, Bld: 92 mg/dL (ref 65–99)
POTASSIUM: 3.6 mmol/L (ref 3.5–5.1)
SODIUM: 139 mmol/L (ref 135–145)

## 2016-09-25 LAB — CBC
HEMATOCRIT: 25.9 % — AB (ref 36.0–46.0)
HEMOGLOBIN: 8.3 g/dL — AB (ref 12.0–15.0)
MCH: 30.1 pg (ref 26.0–34.0)
MCHC: 32 g/dL (ref 30.0–36.0)
MCV: 93.8 fL (ref 78.0–100.0)
Platelets: 196 10*3/uL (ref 150–400)
RBC: 2.76 MIL/uL — AB (ref 3.87–5.11)
RDW: 18.3 % — ABNORMAL HIGH (ref 11.5–15.5)
WBC: 8.2 10*3/uL (ref 4.0–10.5)

## 2016-09-25 LAB — GLUCOSE, CAPILLARY
GLUCOSE-CAPILLARY: 103 mg/dL — AB (ref 65–99)
GLUCOSE-CAPILLARY: 96 mg/dL (ref 65–99)
GLUCOSE-CAPILLARY: 99 mg/dL (ref 65–99)
Glucose-Capillary: 143 mg/dL — ABNORMAL HIGH (ref 65–99)

## 2016-09-25 LAB — HEMOGLOBIN AND HEMATOCRIT, BLOOD
HCT: 25.7 % — ABNORMAL LOW (ref 36.0–46.0)
HEMOGLOBIN: 8.4 g/dL — AB (ref 12.0–15.0)

## 2016-09-25 LAB — HEPARIN LEVEL (UNFRACTIONATED): HEPARIN UNFRACTIONATED: 0.41 [IU]/mL (ref 0.30–0.70)

## 2016-09-25 MED ORDER — LACTULOSE 10 GM/15ML PO SOLN
30.0000 g | Freq: Four times a day (QID) | ORAL | Status: AC
Start: 2016-09-25 — End: 2016-09-26
  Administered 2016-09-25 (×2): 30 g via ORAL
  Filled 2016-09-25 (×2): qty 45

## 2016-09-25 MED ORDER — METOPROLOL TARTRATE 25 MG PO TABS
25.0000 mg | ORAL_TABLET | Freq: Two times a day (BID) | ORAL | Status: DC
  Administered 2016-09-25 – 2016-09-27 (×5): 25 mg via ORAL
  Filled 2016-09-25 (×5): qty 1

## 2016-09-25 MED ORDER — HYDRALAZINE HCL 20 MG/ML IJ SOLN: 10.0000 mg | INTRAMUSCULAR | Status: DC | PRN

## 2016-09-25 MED ORDER — ENSURE ENLIVE PO LIQD
237.0000 mL | Freq: Two times a day (BID) | ORAL | Status: DC
  Administered 2016-09-25 – 2016-09-27 (×5): 237 mL via ORAL
  Filled 2016-09-25: qty 237

## 2016-09-25 NOTE — Progress Notes (Signed)
PROGRESS NOTE    Linda [name]  TOI:[id number] DOB: [birth year] DOA: 09/17/2016 PCP: No PCP Per Patient   Brief Narrative:  71 year old female with past medical history of breast cancer that is post bilateral mastectomy, chemoradiation and currently on chemotherapy, hypertension, GERD, peripheral vascular disease, diastolic CHF, pericardial effusion came to the ER with the complaints of left-sided arm swelling. At that time she also had some change in mental status therefore CT of the head was done which showed focal hypoattenuation within the left cerebellum concerning for possible acute ischemic stroke. Neurology was consulted and MRI was done which was negative for stroke or any metastatic disease in her brain. PT of the chest was also done which revealed pulmonary embolism with evidence of right-sided heart strain with concerns of submassive pulmonary embolism. She was started on heparin drip and then transitioned to Lovenox 1 mg-kilogram every 12 hours as needed.   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 Encephalopathy could have been secondary to seizures versus stroke versus hydrocephalus -Neurosurgery has been following the patient will determine if patient needs a VP shunt versus any other intervention. Per their last note below discuss this with radiology. -Repeat CT scan per neurSx tomorrow.  -LP cancelled yesterday.   Anemia-likely blood loss Right  Buttock Hematoma  -She status post 2 units PRBC transfusion on 09/21/2016 -Heparin drip restarted yesterday. Today her Hb is stable. Will cont to monitor until tomorrow. If remains stable, will considering changing to lovenox 1mg /kg.   Pulmonary embolism-submassive pulmo right nary embolism with right-sided heart strain -currently on hep drip.  -Upper extremity and lower extremity Dopplers were done which were negative for any DVT -MRI of the brain shows chronic white matter changes otherwise no brain lesions or metastatic disease -Echocardiogram done in March 2018 was relatively normal.  Left upper extremity swelling/redness-questionable cellulitis versus lymphedema; improved -Dopplers have been negative -Completed 5 day treatment with Keflex. -Give her lymphedema sleeve  -Place arm on the pillow for elevation.  Concerns for stroke/encephalopathy; resolved  -CT of the head showed questionable acute ischemia therefore MRI of the brain was done which was negative for any acute pathology including any metastatic disease -EEG done-negative  Bacteremia -One set of blood culture positive for Staphylococcus Lugdunensi which is likely contaminant -Continue to monitor for any signs of infection at this time otherwise no need for antibiotics.  Chronic diastolic congestive heart failure, compensated/stable -We will restart her Lasix and spironolactone tomorrow/at the time of discharge  Hypertension -Continue IV hydralazine.  -Cont Aldactone 12.5mg  po daily. Added Metoprolol 25mg  po bid.   GERD -Protonix as needed  Bilateral breast cancer metastasized to brain and lungs? -Status post bilateral mastectomy, radiation therapy and currently getting chemotherapy -Follows up with Dr. Jana Hakim. Has tentative appts on 4/16 and 10/09/16  Constipation -Bowel regimen as needed - miralax, colace and senna.  -I will give her lactulose for 3 doses today.   Physical therapy recommended home PT due  generalized deconditioning. It has been ordered.  DVT prophylaxis: Lovenox Code Status: Full Family Communication:  Family present at bedside Disposition Plan: Transfer to stepdown   Consultants:   Oncology  Neurosurgery  Procedures:   None  Antimicrobials:   Keflex 09/18/2016   Subjective: No complaints today besides when she sits on the chair and her buttock hematoma causes her pain.  She remains constipated.   Objective: Vitals:   09/25/16 1200 09/25/16 1230 09/25/16 1343 09/25/16 1402  BP:  (!) 170/114 (!) 191/84 (!) 148/59  Pulse:  95 99   Resp:  (!) 22    Temp: (!) 100.7 F (38.2 C)     TempSrc: Oral     SpO2:  98%    Weight:      Height:        Intake/Output Summary (Last 24 hours) at 09/25/16 1413 Last data filed at 09/25/16 1200  Gross per 24 hour  Intake           161.55 ml  Output              945 ml  Net          -783.45 ml   Filed Weights   09/21/16 0603 09/22/16 0602 09/22/16 1456  Weight: 94.8 kg (209 lb) 87.4 kg (192 lb 10.9 oz) 97.4 kg (214 lb 11.7 oz)    Examination:  General exam: Appears calm and comfortable  Respiratory system: Clear to auscultation. Respiratory effort normal. Cardiovascular system: S1 & S2 heard, RRR. No JVD, murmurs, rubs, gallops or clicks. No pedal edema. Gastrointestinal system: Abdomen is nondistended, soft and nontender. No organomegaly or masses felt. Normal bowel sounds heard. Central nervous system: Alert and oriented. No focal neurological deficits. Extremities: Symmetric 5 x 5 power.Nonpitting edema for left upper extremity with significant improvement of erythema. Skin: No rashes, lesions or ulcers Psychiatry: Judgement and insight appear normal. Mood & affect appropriate.  Right gluteal ecchymosis with hardened area with concerns for hematoma.  Dressing in place at the area of sacral region.   Data Reviewed:   CBC:  Recent Labs Lab 09/22/16 0419 09/23/16 1157 09/24/16 0644 09/24/16 2217  09/25/16 0211  WBC 8.9 10.2 7.4 8.1 8.2  HGB 9.0* 8.4* 7.7* 8.2* 8.3*  HCT 27.3* 25.8* 24.1* 26.2* 25.9*  MCV 90.1 92.1 93.4 93.6 93.8  PLT 165 166 169 180 299   Basic Metabolic Panel:  Recent Labs Lab 09/22/16 2330 09/23/16 1157 09/24/16 0644 09/25/16 0211  NA 142 141 142 139  K 3.4* 3.6 3.5 3.6  CL 108 106 107 107  CO2 28 26 28 25   GLUCOSE 124* 110* 95 92  BUN 14 15 16 15   CREATININE 0.46 0.47 0.42* 0.47  CALCIUM 8.7* 8.9 8.7* 8.5*  MG  --   --  2.1  --    GFR: Estimated Creatinine Clearance: 81.3 mL/min (by C-G formula based on SCr of 0.47 mg/dL). Liver Function Tests:  Recent Labs Lab 09/22/16 1207  AST 67*  ALT 38  ALKPHOS 108  BILITOT 1.5*  PROT 6.5  ALBUMIN 2.6*   No results for input(s): LIPASE, AMYLASE in the last 168 hours.  Recent Labs Lab 09/22/16 1207  AMMONIA 18   Coagulation Profile: No results for input(s): INR, PROTIME in the last 168 hours. Cardiac Enzymes: No results for input(s): CKTOTAL, CKMB, CKMBINDEX, TROPONINI in the last 168 hours. BNP (last 3 results) No results for input(s): PROBNP in the last 8760 hours. HbA1C: No results for input(s): HGBA1C in the last 72 hours. CBG:  Recent Labs Lab 09/24/16 1611 09/24/16 2005 09/25/16 0004 09/25/16 0328 09/25/16 0844  GLUCAP 105* 148* 99 103* 96   Lipid Profile: No results for input(s): CHOL, HDL, LDLCALC, TRIG, CHOLHDL, LDLDIRECT in the last 72 hours. Thyroid Function Tests:  Recent Labs  09/23/16 1151  TSH 3.331   Anemia Panel: No results for input(s): VITAMINB12, FOLATE, FERRITIN, TIBC, IRON, RETICCTPCT in the last 72 hours. Sepsis Labs: No results for input(s): PROCALCITON, LATICACIDVEN in the last 168 hours.  Recent Results (from the past 240 hour(s))  Blood culture (routine x 2)     Status: Abnormal   Collection Time: 09/17/16  9:10 PM  Result Value Ref Range Status   Specimen Description BLOOD RIGHT ANTECUBITAL  Final   Special Requests   Final    BOTTLES  DRAWN AEROBIC AND ANAEROBIC Blood Culture adequate volume   Culture  Setup Time   Final    GRAM POSITIVE COCCI IN CLUSTERS IN BOTH AEROBIC AND ANAEROBIC BOTTLES CRITICAL RESULT CALLED TO, READ BACK BY AND VERIFIED WITH: C BALL,PHARMD AT 1759 09/20/16 BY L BENFIELD    Culture STAPHYLOCOCCUS LUGDUNENSIS (A)  Final   Report Status 09/22/2016 FINAL  Final   Organism ID, Bacteria STAPHYLOCOCCUS LUGDUNENSIS  Final      Susceptibility   Staphylococcus lugdunensis - MIC*    CIPROFLOXACIN <=0.5 SENSITIVE Sensitive     ERYTHROMYCIN <=0.25 SENSITIVE Sensitive     GENTAMICIN <=0.5 SENSITIVE Sensitive     OXACILLIN 1 SENSITIVE Sensitive     TETRACYCLINE <=1 SENSITIVE Sensitive     VANCOMYCIN <=0.5 SENSITIVE Sensitive     TRIMETH/SULFA <=10 SENSITIVE Sensitive     CLINDAMYCIN <=0.25 SENSITIVE Sensitive     RIFAMPIN <=0.5 SENSITIVE Sensitive     Inducible Clindamycin NEGATIVE Sensitive     * STAPHYLOCOCCUS LUGDUNENSIS  Blood Culture ID Panel (Reflexed)     Status: Abnormal   Collection Time: 09/17/16  9:10 PM  Result Value Ref Range Status   Enterococcus species NOT DETECTED NOT DETECTED Final   Listeria monocytogenes NOT DETECTED NOT DETECTED Final   Staphylococcus species DETECTED (A) NOT DETECTED Final    Comment: Methicillin (oxacillin) susceptible coagulase negative staphylococcus. Possible blood culture contaminant (unless isolated from more than one blood culture draw or clinical case suggests pathogenicity). No antibiotic treatment is indicated for blood  culture contaminants. CRITICAL RESULT CALLED TO, READ BACK BY AND VERIFIED WITH: C BALL,PHARMD AT 1759 09/20/16 BY L BENFIELD    Staphylococcus aureus NOT DETECTED NOT DETECTED Final   Methicillin resistance NOT DETECTED NOT DETECTED Final   Streptococcus species NOT DETECTED NOT DETECTED Final   Streptococcus agalactiae NOT DETECTED NOT DETECTED Final   Streptococcus pneumoniae NOT DETECTED NOT DETECTED Final   Streptococcus pyogenes NOT  DETECTED NOT DETECTED Final   Acinetobacter baumannii NOT DETECTED NOT DETECTED Final   Enterobacteriaceae species NOT DETECTED NOT DETECTED Final   Enterobacter cloacae complex NOT DETECTED NOT DETECTED Final   Escherichia coli NOT DETECTED NOT DETECTED Final   Klebsiella oxytoca NOT DETECTED NOT DETECTED Final   Klebsiella pneumoniae NOT DETECTED NOT DETECTED Final   Proteus species NOT DETECTED NOT DETECTED Final   Serratia marcescens NOT DETECTED NOT DETECTED Final   Haemophilus influenzae NOT DETECTED NOT DETECTED Final   Neisseria meningitidis NOT DETECTED NOT DETECTED Final   Pseudomonas aeruginosa NOT DETECTED NOT DETECTED Final   Candida albicans NOT DETECTED NOT DETECTED Final   Candida glabrata NOT DETECTED NOT DETECTED Final   Candida krusei NOT DETECTED  NOT DETECTED Final   Candida parapsilosis NOT DETECTED NOT DETECTED Final   Candida tropicalis NOT DETECTED NOT DETECTED Final  Blood culture (routine x 2)     Status: None   Collection Time: 09/17/16  9:15 PM  Result Value Ref Range Status   Specimen Description BLOOD RIGHT HAND  Final   Special Requests   Final    BOTTLES DRAWN AEROBIC AND ANAEROBIC Blood Culture adequate volume   Culture NO GROWTH 6 DAYS  Final   Report Status 09/23/2016 FINAL  Final  MRSA PCR Screening     Status: None   Collection Time: 09/18/16 10:20 AM  Result Value Ref Range Status   MRSA by PCR NEGATIVE NEGATIVE Final    Comment:        The GeneXpert MRSA Assay (FDA approved for NASAL specimens only), is one component of a comprehensive MRSA colonization surveillance program. It is not intended to diagnose MRSA infection nor to guide or monitor treatment for MRSA infections.          Radiology Studies: No results found.      Scheduled Meds: . anastrozole  1 mg Oral Daily  . B-complex with vitamin C  1 tablet Oral Daily  . cholecalciferol  1,000 Units Oral Daily  . docusate sodium  100 mg Oral BID  . feeding supplement  (ENSURE ENLIVE)  237 mL Oral BID BM  . furosemide  20 mg Oral Daily  . lactulose  30 g Oral Q6H  . magnesium oxide  400 mg Oral Daily  . metoprolol tartrate  25 mg Oral BID  . multivitamins with iron  1 tablet Oral Daily  . polyethylene glycol  17 g Oral Daily  . sodium chloride flush  3 mL Intravenous Q12H  . spironolactone  12.5 mg Oral Daily   Continuous Infusions: . heparin 900 Units/hr (09/25/16 0734)     LOS: 7 days    Time spent: 35 mins     Eulan Heyward Arsenio Loader, MD [facility] Pager [phone]   If 7PM-7AM, please contact night-coverage [email or web address] Password [facility] 09/25/2016, 2:13 PM

## 2016-09-25 NOTE — Progress Notes (Signed)
ANTICOAGULATION CONSULT NOTE - Follow Up Consult  Pharmacy Consult for Heparin  Indication: pulmonary embolus  No Known Allergies  Patient Measurements: Height: 5\' 9"  (175.3 cm) Weight: 214 lb 11.7 oz (97.4 kg) IBW/kg (Calculated) : 66.2  Vital Signs: Temp: 98.3 F (36.8 C) (04/12 0400) Temp Source: Oral (04/12 0400) BP: 146/69 (04/12 0800) Pulse Rate: 77 (04/12 0800)  Labs:  Recent Labs  09/23/16 1157 09/24/16 0644 09/24/16 2217 09/25/16 0211  HGB 8.4* 7.7* 8.2* 8.3*  HCT 25.8* 24.1* 26.2* 25.9*  PLT 166 169 180 196  HEPARINUNFRC  --   --  0.54 0.41  CREATININE 0.47 0.42*  --  0.47    Estimated Creatinine Clearance: 81.3 mL/min (by C-G formula based on SCr of 0.47 mg/dL).   Assessment: 71 y/o F with PE whose anti-coagulation was resumed today after being on hold for gluteal hematoma/mental status changes. Hgb remains low but stable. Heparin level remains therapeutic this morning. No new signs of bleeding. Planning to transition back to lovenox when appropriate.   Goal of Therapy:  Heparin level 0.3-0.7 units/ml Monitor platelets by anticoagulation protocol: Yes   Plan:  Continue heparin gtt 900 units/hr Daily heparin level and CBC F/u transition to lovenox  Jep Dyas, Rande Lawman 09/25/2016,8:50 AM

## 2016-09-25 NOTE — Progress Notes (Signed)
Dr. Reesa Chew updated on patient BP 191/87. Continuing to monitor closely.

## 2016-09-25 NOTE — Plan of Care (Signed)
Problem: Education: Goal: Knowledge of [place] [facility] Education information/materials will improve Outcome: Progressing Pt transferred from [facility] with no complications. Pts VSS, remains on heparin infusion and therapeutic at this time. Pt A&Ox4 and compliant with calling for assistance when needing to get OOB. Pt with foley in place, no BM throughout shift. Pt with large bruise on buttocks and no s/s of skin breakdown at this time, foam dressing in place. No pain reported. Family at bedside. Will continue to monitor.

## 2016-09-25 NOTE — Progress Notes (Signed)
Dr. Reesa Chew at bedside and updated on patient condition: aware BP 192/85 with MAP 110 after PRN hydralazine x2. Also made aware patient has not had a BM in 5-6 days. MD to evaluate meds. Continuing to monitor.

## 2016-09-25 NOTE — Progress Notes (Signed)
This RN noted a small amount (~5-70mL) of frank red blood in the pts stool. MD Tylene Fantasia notified. Orders placed for stat H+H and hemoccult card to be sent to lab.  Will continue to monitor.

## 2016-09-25 NOTE — Care Management Note (Signed)
Case Management Note Original note by Dellie Catholic, RN 09/21/2016, 12:11 PM   Patient Details  Name: Linda [name] MRN: [id number] Date of Birth: 1946/04/15  Subjective/Objective:                  left-sided arm swelling Action/Plan: Discharge planning Expected Discharge Date:  09/21/16               Expected Discharge Plan:  [place]  In-House Referral:  NA  Discharge planning Services  CM Consult  Post Acute Care Choice:  Home Health Choice offered to:  Patient  DME Arranged:  3-N-1 DME Agency:  [place]:  PT [facility] Agency:  [place]  Status of Service:  Completed, signed off  If discussed at [place] of Stay Meetings, dates discussed:    Additional Comments: CM met with pt in room who states her son is providing transportation home. Cm offered choice of home health agency and pt chooses [facility] to render HHPT. Referral called to [facility] rep, Linda [name]. CM notified [place] DME rep, Linda [name] to please deliver the 3n1 to room prior to discharge. No other CM needs were communicated.  09/24/16 J. Jomo Forand, RN, BSN  Met with pt to discuss [employer]. She still plans to dc home with son and daughter in law and [facility] services as arranged previously.   Recommend OT consult.    Reinaldo Raddle, RN, BSN  Trauma/Neuro ICU Case Manager [phone]

## 2016-09-25 NOTE — Progress Notes (Addendum)
Physical Therapy Treatment Patient Details Name: Linda [name] MRN: [id number] DOB: 14-Feb-1946 Today's Date: 09/25/2016    History of Present Illness Pt is a 71 y/o female admitted after having increased frequency of falls with R UE pain and AMS. Pt found to have an acute PE with R heart strain, heparin drip initiated on 4/5. Pt negative for an acute CVA/TIA. PMH including but not limited to CHF, PVD, HTN and breast cancer with mets to brain and lungs s/p bilateral mastectomy in 2013.  Patient with decreased responsiveness and concern for acute hydrocephalus, but improved without intervention.    PT Comments    Patient progressing well toward PT goals. No confusion noted today. Tolerated gait training with min guard assist for safety. Has some difficulty with navigating RW and with turns. BP elevated. 151/92 pre activity and 202/94 post activity. RN aware. Demonstrates fatigue and decreased functional strength requiring assist for powering up from all surfaces. Will continue to follow.    Follow Up Recommendations  Home health PT;Supervision/Assistance - 24 hour     Equipment Recommendations  None recommended by PT    Recommendations for Other Services       Precautions / Restrictions Precautions Precautions: Fall Restrictions Weight Bearing Restrictions: No    Mobility  Bed Mobility Overal bed mobility: Needs Assistance Bed Mobility: Supine to Sit     Supine to sit: Mod assist;HOB elevated     General bed mobility comments: Assist to elevate trunk and scoot bottom to EOB. Increased time and cues.   Transfers Overall transfer level: Needs assistance Equipment used: Rolling walker (2 wheeled) Transfers: Sit to/from Stand Sit to Stand: Mod assist;+2 physical assistance         General transfer comment: Assist to power to standing with cues for hand placement/technique. Stood from [employer], from chair x1.   Ambulation/Gait Ambulation/Gait assistance: Min  guard Ambulation Distance (Feet): 140 Feet Assistive device: Rolling walker (2 wheeled) Gait Pattern/deviations: Step-through pattern;Decreased stride length;Trunk flexed Gait velocity: decreased Gait velocity interpretation: Below normal speed for age/gender General Gait Details: Slow, mildly unsteady gait with difficulty navigating and turning RW at times. HR stable.   Stairs            Wheelchair Mobility    Modified Rankin (Stroke Patients Only)       Balance Overall balance assessment: Needs assistance Sitting-balance support: Feet supported;Single extremity supported Sitting balance-Leahy Scale: Good     Standing balance support: During functional activity;Bilateral upper extremity supported Standing balance-Leahy Scale: Poor Standing balance comment: UE support needed for balance.                            Cognition Arousal/Alertness: Awake/alert Behavior During Therapy: WFL for tasks assessed/performed Overall Cognitive Status: Within Functional Limits for tasks assessed                                        Exercises      General Comments General comments (skin integrity, edema, etc.): VSS.      Pertinent Vitals/Pain Pain Assessment: Faces Pain Score: 4  Pain Location: R buttocks Pain Descriptors / Indicators: Discomfort;Sore Pain Intervention(s): Monitored during session;Repositioned    Home Living                      Prior Function  PT Goals (current goals can now be found in the care plan section) Progress towards PT goals: Progressing toward goals    Frequency    Min 3X/week      PT Plan Current plan remains appropriate    Co-evaluation             End of Session Equipment Utilized During Treatment: Gait belt Activity Tolerance: Patient tolerated treatment well Patient left: in chair;with call bell/phone within reach;with nursing/sitter in room Nurse Communication:  Mobility status PT Visit Diagnosis: Other abnormalities of gait and mobility (R26.89);Muscle weakness (generalized) (M62.81)     Time: 1594-5859 PT Time Calculation (min) (ACUTE ONLY): 33 min  Charges:  $Gait Training: 23-37 mins                    G Codes:       Wray Kearns, PT, DPT [phone]     [place] 09/25/2016, 11:01 AM

## 2016-09-25 NOTE — Progress Notes (Signed)
Dr. Reesa Chew made aware patient BP 212/95 after PRN hydralazine x1. Patient denies h/a or any other s/s of hypertension. Assessment unchanged. Orders received to give another 5mg  IV x1 now. Continuing to monitor closely.

## 2016-09-26 ENCOUNTER — Inpatient Hospital Stay (HOSPITAL_COMMUNITY): Payer: Medicare Other

## 2016-09-26 LAB — BASIC METABOLIC PANEL
ANION GAP: 10 (ref 5–15)
BUN: 16 mg/dL (ref 6–20)
CALCIUM: 9.2 mg/dL (ref 8.9–10.3)
CO2: 26 mmol/L (ref 22–32)
Chloride: 104 mmol/L (ref 101–111)
Creatinine, Ser: 0.45 mg/dL (ref 0.44–1.00)
Glucose, Bld: 120 mg/dL — ABNORMAL HIGH (ref 65–99)
Potassium: 3.5 mmol/L (ref 3.5–5.1)
Sodium: 140 mmol/L (ref 135–145)

## 2016-09-26 LAB — OCCULT BLOOD X 1 CARD TO LAB, STOOL: FECAL OCCULT BLD: NEGATIVE

## 2016-09-26 LAB — CBC
HEMATOCRIT: 26.5 % — AB (ref 36.0–46.0)
Hemoglobin: 8.4 g/dL — ABNORMAL LOW (ref 12.0–15.0)
MCH: 29.8 pg (ref 26.0–34.0)
MCHC: 31.7 g/dL (ref 30.0–36.0)
MCV: 94 fL (ref 78.0–100.0)
Platelets: 205 10*3/uL (ref 150–400)
RBC: 2.82 MIL/uL — ABNORMAL LOW (ref 3.87–5.11)
RDW: 18.1 % — AB (ref 11.5–15.5)
WBC: 8.6 10*3/uL (ref 4.0–10.5)

## 2016-09-26 LAB — MAGNESIUM: MAGNESIUM: 2 mg/dL (ref 1.7–2.4)

## 2016-09-26 LAB — GLUCOSE, CAPILLARY: Glucose-Capillary: 91 mg/dL (ref 65–99)

## 2016-09-26 LAB — HEPARIN LEVEL (UNFRACTIONATED): HEPARIN UNFRACTIONATED: 0.34 [IU]/mL (ref 0.30–0.70)

## 2016-09-26 MED ORDER — ENOXAPARIN SODIUM 100 MG/ML ~~LOC~~ SOLN
1.0000 mg/kg | Freq: Two times a day (BID) | SUBCUTANEOUS | Status: DC
  Administered 2016-09-26 – 2016-09-27 (×3): 95 mg via SUBCUTANEOUS
  Filled 2016-09-26 (×3): qty 1

## 2016-09-26 NOTE — Care Management Note (Signed)
Case Management Note  Patient Details  Name: Linda [name] MRN: [id number] Date of Birth: 10/04/1945  Subjective/Objective:  CM met with pt in room who states her son is providing transportation home. Cm offered choice of home health agency and pt chooses [facility] to render HHPT. Referral called to [facility] rep, Jermaine. CM notified [place] DME rep, Reggie to please deliver the 3n1 to room prior to discharge. No other CM needs were communicated.  09/24/16 J. [place], RN, BSN  Met with pt to discuss [employer]. She still plans to dc home with son and daughter in law and [facility] services as arranged previously.   Recommend OT consult.   09/26/16 [place], BSN- Patient has PE, will transition from heparin to lovenox.  Per benefit check      S/W [place] @ [employer] # [phone]   1LOVENOX 95 MG BID AND  150 MG BID  COVER- NOT COVER  PRIOR APPROVAL- YES # [phone]   2 ENOXAPARIN  100 MG BID  (no 95 mg )  COVER- YES  CO-PAY- $ 12.77  TIER- 4 DRUG  PRIOR APPROVAL- NO   3. ENOXAPARIN 150 MG BID  COVER- YES  CO-PAY- $ 30.36  TIER- 4 DRUG  PRIOR APPROVAL- NO   PHARMACY : [facility] AND [facility]                Action/Plan:   Expected Discharge Date:  09/21/16               Expected Discharge Plan:  [place]  In-House Referral:  NA  Discharge planning Services  CM Consult  Post Acute Care Choice:  Home Health Choice offered to:  Patient  DME Arranged:  3-N-1 DME Agency:  [place]:  PT [place] Agency:  [place]  Status of Service:  Completed, signed off  If discussed at [place] of Stay Meetings, dates discussed:    Additional Comments:  Zenon Mayo, RN 09/26/2016, 5:12 PM

## 2016-09-26 NOTE — Progress Notes (Signed)
PROGRESS NOTE    Linda [name]  RWE:[id number] DOB: 14-Mar-1946 DOA: 09/17/2016 PCP: No PCP Per Patient   Brief Narrative:  71 year old female with past medical history of breast cancer that is post bilateral mastectomy, chemoradiation and currently on chemotherapy, hypertension, GERD, peripheral vascular disease, diastolic CHF, pericardial effusion came to the ER with the complaints of left-sided arm swelling. At that time she also had some change in mental status therefore CT of the head was done which showed focal hypoattenuation within the left cerebellum concerning for possible acute ischemic stroke. Neurology was consulted and MRI was done which was negative for stroke or any metastatic disease in her brain. PT of the chest was also done which revealed pulmonary embolism with evidence of right-sided heart strain with concerns of submassive pulmonary embolism. She was started on heparin drip and then transitioned to Lovenox 1 mg-kilogram every 12 hours as needed.   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 Encephalopathy could have been secondary to seizures versus stroke versus hydrocephalus -Neurosurgery has been following the patient will determine if patient needs a VP shunt versus any other intervention. Per their last note below discuss this with radiology. -Repeat CT scan per neurSx - hydrocephalus is improving  Anemia-likely blood loss Right Buttock  Hematoma  -She status post 2 units PRBC transfusion on 09/21/2016 -Today her Hb is stable.  -Discontinue heparin drip and transitioned to Lovenox 1 mg/kg. CBC Latest Ref Rng & Units 09/26/2016 09/25/2016 09/25/2016  WBC 4.0 - 10.5 K/uL 8.6 - 8.2  Hemoglobin 12.0 - 15.0 g/dL 8.4(L) 8.4(L) 8.3(L)  Hematocrit 36.0 - 46.0 % 26.5(L) 25.7(L) 25.9(L)  Platelets 150 - 400 K/uL 205 - 196    Pulmonary embolism-submassive pulmo right nary embolism with right-sided heart strain -lovenox 1mg /kg -Upper extremity and lower extremity Dopplers were done which were negative for any DVT -MRI of the brain shows chronic white matter changes otherwise no brain lesions or metastatic disease -Echocardiogram done in March 2018 was relatively normal.  Left upper extremity swelling/redness-questionable cellulitis versus lymphedema; improved -Dopplers have been negative -Completed 5 day treatment with Keflex. -Give her lymphedema sleeve  -Place arm on the pillow for elevation.  Concerns for stroke/encephalopathy; resolved  -CT of the head showed questionable acute ischemia therefore MRI of the brain was done which was negative for any acute pathology including any metastatic disease -EEG done-negative  Bacteremia -One set of blood culture positive for Staphylococcus Lugdunensi which is likely contaminant -Continue to monitor for any signs of infection at this time otherwise no need for antibiotics.  Chronic diastolic congestive heart failure, compensated/stable -restarted home meds   Hypertension -Continue IV hydralazine.  -Cont Aldactone 12.5mg  po daily. Added Metoprolol 25mg  po bid.   GERD -Protonix as needed  Bilateral breast cancer metastasized to brain and lungs? -Status post bilateral mastectomy, radiation therapy and currently getting chemotherapy -Follows up with Dr. Jana Hakim. Has tentative appts on 4/16 and 10/09/16  Constipation -Better. Had a big bowel movement this morning. We'll  discontinue  lactulose now and continue other bowel regimen as needed  Physical therapy recommended home PT due generalized deconditioning. It has been ordered.  DVT prophylaxis: Lovenox Code Status: Full Family Communication:  None Disposition Plan: Transfer to telemetry  Consultants:   Oncology  Neurosurgery  Procedures:   None  Antimicrobials:   Keflex 09/18/2016   Subjective: Patient states she feels much better especially now that she had a big bowel movement this morning. No other complaints at this time and is eager to go home once she is medically stable.  Objective: Vitals:   09/26/16 0355 09/26/16 0400 09/26/16 0832 09/26/16 1235  BP: (!) 132/46  (!) 139/57 (!) 128/45  Pulse:   70 66  Resp: 17  (!) 21 19  Temp:  98.8 F (37.1 C) 98.4 F (36.9 C) 98.4 F (36.9 C)  TempSrc:  Oral Oral Oral  SpO2:   98% 98%  Weight:      Height:        Intake/Output Summary (Last 24 hours) at 09/26/16 1320 Last data filed at 09/26/16 0900  Gross per 24 hour  Intake             1030 ml  Output              600 ml  Net              430 ml   Filed Weights   09/21/16 0603 09/22/16 0602 09/22/16 1456  Weight: 94.8 kg (209 lb) 87.4 kg (192 lb 10.9 oz) 97.4 kg (214 lb 11.7 oz)    Examination:  General exam: Appears calm and comfortable  Respiratory system: Clear to auscultation. Respiratory effort normal. Cardiovascular system: S1 & S2 heard, RRR. No JVD, murmurs, rubs, gallops or clicks. No pedal edema. Gastrointestinal system: Abdomen is nondistended, soft and nontender. No organomegaly or masses felt. Normal bowel sounds heard. Central nervous system: Alert and oriented. No focal neurological deficits. Extremities: Symmetric 5 x 5 power.Nonpitting edema for left upper extremity with significant improvement of erythema. Skin: No rashes, lesions or ulcers Psychiatry: Judgement and insight appear normal. Mood & affect appropriate.  Right gluteal ecchymosis with hardened  area with concerns for hematoma.  Dressing in place at the area of sacral region.   Data Reviewed:   CBC:  Recent Labs Lab 09/23/16 1157 09/24/16 0644 09/24/16 2217 09/25/16 0211 09/25/16 2307 09/26/16 0253  WBC 10.2 7.4 8.1 8.2  --  8.6  HGB 8.4* 7.7* 8.2* 8.3* 8.4* 8.4*  HCT 25.8* 24.1* 26.2* 25.9* 25.7* 26.5*  MCV 92.1 93.4 93.6 93.8  --  94.0  PLT 166 169 180 196  --  124   Basic Metabolic Panel:  Recent Labs Lab 09/22/16 2330 09/23/16 1157 09/24/16 0644 09/25/16 0211 09/26/16 0253  NA 142 141 142 139 140  K 3.4* 3.6 3.5 3.6 3.5  CL 108 106 107 107 104  CO2 28 26 28 25 26   GLUCOSE 124* 110* 95 92 120*  BUN 14 15 16 15 16   CREATININE 0.46 0.47 0.42* 0.47 0.45  CALCIUM 8.7* 8.9 8.7* 8.5* 9.2  MG  --   --  2.1  --  2.0   GFR: Estimated Creatinine Clearance: 81.3 mL/min (by C-G formula based on SCr of 0.45 mg/dL). Liver Function Tests:  Recent Labs Lab 09/22/16 1207  AST 67*  ALT 38  ALKPHOS 108  BILITOT 1.5*  PROT 6.5  ALBUMIN 2.6*   No results for input(s): LIPASE, AMYLASE in the last 168  hours.  Recent Labs Lab 09/22/16 1207  AMMONIA 18   Coagulation Profile: No results for input(s): INR, PROTIME in the last 168 hours. Cardiac Enzymes: No results for input(s): CKTOTAL, CKMB, CKMBINDEX, TROPONINI in the last 168 hours. BNP (last 3 results) No results for input(s): PROBNP in the last 8760 hours. HbA1C: No results for input(s): HGBA1C in the last 72 hours. CBG:  Recent Labs Lab 09/25/16 0004 09/25/16 0328 09/25/16 0844 09/25/16 1752 09/26/16 0830  GLUCAP 99 103* 96 143* 91   Lipid Profile: No results for input(s): CHOL, HDL, LDLCALC, TRIG, CHOLHDL, LDLDIRECT in the last 72 hours. Thyroid Function Tests: No results for input(s): TSH, T4TOTAL, FREET4, T3FREE, THYROIDAB in the last 72 hours. Anemia Panel: No results for input(s): VITAMINB12, FOLATE, FERRITIN, TIBC, IRON, RETICCTPCT in the last 72 hours. Sepsis Labs: No results for  input(s): PROCALCITON, LATICACIDVEN in the last 168 hours.  Recent Results (from the past 240 hour(s))  Blood culture (routine x 2)     Status: Abnormal   Collection Time: 09/17/16  9:10 PM  Result Value Ref Range Status   Specimen Description BLOOD RIGHT ANTECUBITAL  Final   Special Requests   Final    BOTTLES DRAWN AEROBIC AND ANAEROBIC Blood Culture adequate volume   Culture  Setup Time   Final    GRAM POSITIVE COCCI IN CLUSTERS IN BOTH AEROBIC AND ANAEROBIC BOTTLES CRITICAL RESULT CALLED TO, READ BACK BY AND VERIFIED WITH: C BALL,PHARMD AT 1759 09/20/16 BY L BENFIELD    Culture STAPHYLOCOCCUS LUGDUNENSIS (A)  Final   Report Status 09/22/2016 FINAL  Final   Organism ID, Bacteria STAPHYLOCOCCUS LUGDUNENSIS  Final      Susceptibility   Staphylococcus lugdunensis - MIC*    CIPROFLOXACIN <=0.5 SENSITIVE Sensitive     ERYTHROMYCIN <=0.25 SENSITIVE Sensitive     GENTAMICIN <=0.5 SENSITIVE Sensitive     OXACILLIN 1 SENSITIVE Sensitive     TETRACYCLINE <=1 SENSITIVE Sensitive     VANCOMYCIN <=0.5 SENSITIVE Sensitive     TRIMETH/SULFA <=10 SENSITIVE Sensitive     CLINDAMYCIN <=0.25 SENSITIVE Sensitive     RIFAMPIN <=0.5 SENSITIVE Sensitive     Inducible Clindamycin NEGATIVE Sensitive     * STAPHYLOCOCCUS LUGDUNENSIS  Blood Culture ID Panel (Reflexed)     Status: Abnormal   Collection Time: 09/17/16  9:10 PM  Result Value Ref Range Status   Enterococcus species NOT DETECTED NOT DETECTED Final   Listeria monocytogenes NOT DETECTED NOT DETECTED Final   Staphylococcus species DETECTED (A) NOT DETECTED Final    Comment: Methicillin (oxacillin) susceptible coagulase negative staphylococcus. Possible blood culture contaminant (unless isolated from more than one blood culture draw or clinical case suggests pathogenicity). No antibiotic treatment is indicated for blood  culture contaminants. CRITICAL RESULT CALLED TO, READ BACK BY AND VERIFIED WITH: C BALL,PHARMD AT 1759 09/20/16 BY L BENFIELD      Staphylococcus aureus NOT DETECTED NOT DETECTED Final   Methicillin resistance NOT DETECTED NOT DETECTED Final   Streptococcus species NOT DETECTED NOT DETECTED Final   Streptococcus agalactiae NOT DETECTED NOT DETECTED Final   Streptococcus pneumoniae NOT DETECTED NOT DETECTED Final   Streptococcus pyogenes NOT DETECTED NOT DETECTED Final   Acinetobacter baumannii NOT DETECTED NOT DETECTED Final   Enterobacteriaceae species NOT DETECTED NOT DETECTED Final   Enterobacter cloacae complex NOT DETECTED NOT DETECTED Final   Escherichia coli NOT DETECTED NOT DETECTED Final   Klebsiella oxytoca NOT DETECTED NOT DETECTED Final   Klebsiella pneumoniae NOT DETECTED NOT  DETECTED Final   Proteus species NOT DETECTED NOT DETECTED Final   Serratia marcescens NOT DETECTED NOT DETECTED Final   Haemophilus influenzae NOT DETECTED NOT DETECTED Final   Neisseria meningitidis NOT DETECTED NOT DETECTED Final   Pseudomonas aeruginosa NOT DETECTED NOT DETECTED Final   Candida albicans NOT DETECTED NOT DETECTED Final   Candida glabrata NOT DETECTED NOT DETECTED Final   Candida krusei NOT DETECTED NOT DETECTED Final   Candida parapsilosis NOT DETECTED NOT DETECTED Final   Candida tropicalis NOT DETECTED NOT DETECTED Final  Blood culture (routine x 2)     Status: None   Collection Time: 09/17/16  9:15 PM  Result Value Ref Range Status   Specimen Description BLOOD RIGHT HAND  Final   Special Requests   Final    BOTTLES DRAWN AEROBIC AND ANAEROBIC Blood Culture adequate volume   Culture NO GROWTH 6 DAYS  Final   Report Status 09/23/2016 FINAL  Final  MRSA PCR Screening     Status: None   Collection Time: 09/18/16 10:20 AM  Result Value Ref Range Status   MRSA by PCR NEGATIVE NEGATIVE Final    Comment:        The GeneXpert MRSA Assay (FDA approved for NASAL specimens only), is one component of a comprehensive MRSA colonization surveillance program. It is not intended to diagnose MRSA infection nor to  guide or monitor treatment for MRSA infections.          Radiology Studies: Ct Head Wo Contrast  Result Date: 09/26/2016 CLINICAL DATA:  HYDROCEPHALUS EXAM: CT HEAD WITHOUT CONTRAST TECHNIQUE: Contiguous axial images were obtained from the base of the skull through the vertex without intravenous contrast. COMPARISON:  09/22/2016 FINDINGS: Brain: Interval decrease in hydrocephalus with no further dilatation of the temporal horns of the lateral ventricles. There is no midline shift. Periventricular white matter hypoattenuation, also improved. No acute intracranial hemorrhage, mass, or mass effect. Acute infarct may be inapparent on noncontrast CT. Vascular: Atherosclerotic and physiologic intracranial calcifications. Skull: Stable lytic lesion in the left frontal bone. Negative for fracture or acute change. Sinuses/Orbits: Extensive opacification of left mastoid air cells as before. Right mastoid air cells and visualized paranasal sinuses appear normally developed and well aerated. Orbits unremarkable. Other: None IMPRESSION: 1. Improving hydrocephalus with interval decrease in white matter hypoattenuation. 2. Negative for bleed or other acute intracranial process. 3. Persistent opacification of left mastoid air cells. Electronically Signed   By: Lucrezia Europe M.D.   On: 09/26/2016 08:55        Scheduled Meds: . anastrozole  1 mg Oral Daily  . B-complex with vitamin C  1 tablet Oral Daily  . cholecalciferol  1,000 Units Oral Daily  . docusate sodium  100 mg Oral BID  . feeding supplement (ENSURE ENLIVE)  237 mL Oral BID BM  . furosemide  20 mg Oral Daily  . magnesium oxide  400 mg Oral Daily  . metoprolol tartrate  25 mg Oral BID  . multivitamins with iron  1 tablet Oral Daily  . polyethylene glycol  17 g Oral Daily  . sodium chloride flush  3 mL Intravenous Q12H  . spironolactone  12.5 mg Oral Daily   Continuous Infusions: . heparin 950 Units/hr (09/26/16 1000)     LOS: 8 days     Time spent: 35 mins     Ankit Arsenio Loader, MD [facility] Pager [phone]   If 7PM-7AM, please contact night-coverage [email or web address] Password [email or web address] 09/26/2016, 1:20 PM

## 2016-09-26 NOTE — Progress Notes (Signed)
Stool sample collected for hemoccult at 0023. Sample sent to lab. Awaiting result. Will continue to monitor.

## 2016-09-26 NOTE — Care Management Important Message (Signed)
Important Message  Patient Details  Name: Linda [name] MRN: [id number] Date of Birth: 1945/06/29   Medicare Important Message Given:  Yes    Nathen May 09/26/2016, 2:04 PM

## 2016-09-26 NOTE — Progress Notes (Signed)
Pt transferred to [place] bed 30 per bed by RN  with all belongings. Has cell phone charger and glasses with her Portacath flushed with 10 cc of sterile normal saline

## 2016-09-26 NOTE — Progress Notes (Signed)
ANTICOAGULATION CONSULT NOTE - Follow Up Consult  Pharmacy Consult for Heparin  Indication: pulmonary embolus  No Known Allergies  Patient Measurements: Height: 5\' 9"  (175.3 cm) Weight: 214 lb 11.7 oz (97.4 kg) IBW/kg (Calculated) : 66.2  Vital Signs: Temp: 98.8 F (37.1 C) (04/13 0400) Temp Source: Oral (04/13 0400) BP: 132/46 (04/13 0355) Pulse Rate: 84 (04/13 0000)  Labs:  Recent Labs  09/24/16 0644 09/24/16 2217 09/25/16 0211 09/25/16 2307 09/26/16 0253  HGB 7.7* 8.2* 8.3* 8.4* 8.4*  HCT 24.1* 26.2* 25.9* 25.7* 26.5*  PLT 169 180 196  --  205  HEPARINUNFRC  --  0.54 0.41  --  0.34  CREATININE 0.42*  --  0.47  --  0.45    Estimated Creatinine Clearance: 81.3 mL/min (by C-G formula based on SCr of 0.45 mg/dL).  Assessment: 71 y/o F with PE whose anti-coagulation was resumed today after being on hold for gluteal hematoma/mental status changes. Hgb remains low but stable. Heparin level remains therapeutic this morning. No new signs of bleeding. Planning to transition back to lovenox when appropriate.   Goal of Therapy:  Heparin level 0.3-0.7 units/ml Monitor platelets by anticoagulation protocol: Yes   Plan:  Increase heparin gtt slightly to 950 units/hr Monitor daily heparin level, CBC, s/s of bleed  Elenor Quinones, PharmD, [facility] Clinical Pharmacist Pager [phone] 09/26/2016 7:51 AM

## 2016-09-26 NOTE — Progress Notes (Signed)
S/W CEC[facility] @ [facility] # [phone]   1LOVENOX 95 MG BID AND  150 MG BID  COVER- NOT COVER  PRIOR APPROVAL- YES # [phone]   2 ENOXAPARIN  100 MG BID  (no 95 mg )  COVER- YES  CO-PAY- $ 12.77  TIER- 4 DRUG  PRIOR APPROVAL- NO   3. ENOXAPARIN 150 MG BID  COVER- YES  CO-PAY- $ 30.36  TIER- 4 DRUG  PRIOR APPROVAL- NO   PHARMACY : [facility] AND [facility]

## 2016-09-26 NOTE — Progress Notes (Signed)
Called report to 2 [place]. Spoke to [employer]

## 2016-09-26 NOTE — Progress Notes (Signed)
Patient ID: Linda [name], female   DOB: May 27, 1946, 71 y.o.   MRN: [id number] BP (!) 113/44 (BP Location: Right Arm)   Pulse 73   Temp 98.3 F (36.8 C) (Oral)   Resp 18   Ht 5\' 9"  (1.753 m)   Wt 96.8 kg (213 lb 6.5 oz)   SpO2 98%   BMI 31.51 kg/m  Alert and oriented x 4 Speech is clear, fluent Following all commands CT scan this am is normal.  No further followup necessary.  Will sign off.

## 2016-09-26 NOTE — Progress Notes (Signed)
Physical Therapy Treatment Patient Details Name: Linda [name] MRN: [id number] DOB: 11/18/1945 Today's Date: 09/26/2016    History of Present Illness Pt is a 71 y/o female admitted after having increased frequency of falls with R UE pain and AMS. Pt found to have an acute PE with R heart strain, heparin drip initiated on 4/5. Pt negative for an acute CVA/TIA. PMH including but not limited to CHF, PVD, HTN and breast cancer with mets to brain and lungs s/p bilateral mastectomy in 2013.  Patient with decreased responsiveness and concern for acute hydrocephalus, but improved without intervention.    PT Comments    Pt making steady progress with mobility, but continues to require physical assistance with bed mobility and transfers.  All VSS throughout. Pt would continue to benefit from skilled physical therapy services at this time while admitted and after d/c to address the below listed limitations in order to improve overall safety and independence with functional mobility.   Follow Up Recommendations  Home health PT;Supervision/Assistance - 24 hour     Equipment Recommendations  None recommended by PT    Recommendations for Other Services       Precautions / Restrictions Precautions Precautions: Fall Restrictions Weight Bearing Restrictions: No    Mobility  Bed Mobility Overal bed mobility: Needs Assistance Bed Mobility: Supine to Sit     Supine to sit: Min guard;HOB elevated     General bed mobility comments: HOB very elevated, increased time, heavy use of bed rails, min guard for safety  Transfers Overall transfer level: Needs assistance Equipment used: Rolling walker (2 wheeled) Transfers: Sit to/from Stand Sit to Stand: Mod assist;From elevated surface         General transfer comment: increased time, VC'ing for bilateral hand placement, mod A to rise from elevated bed position  Ambulation/Gait Ambulation/Gait assistance: Min guard Ambulation Distance  (Feet): 200 Feet Assistive device: Rolling walker (2 wheeled) Gait Pattern/deviations: Step-through pattern;Decreased stride length;Trunk flexed Gait velocity: decreased Gait velocity interpretation: Below normal speed for age/gender General Gait Details: mild instability with use of RW, no LOB or need for physical assistance.    Stairs            Wheelchair Mobility    Modified Rankin (Stroke Patients Only)       Balance Overall balance assessment: Needs assistance Sitting-balance support: Feet supported Sitting balance-Leahy Scale: Fair     Standing balance support: During functional activity;Bilateral upper extremity supported Standing balance-Leahy Scale: Poor Standing balance comment: UE support needed for balance.                            Cognition Arousal/Alertness: Awake/alert Behavior During Therapy: WFL for tasks assessed/performed Overall Cognitive Status: Within Functional Limits for tasks assessed                                        Exercises      General Comments        Pertinent Vitals/Pain Pain Assessment: No/denies pain    Home Living                      Prior Function            PT Goals (current goals can now be found in the care plan section) Acute Rehab PT Goals PT Goal Formulation: With patient  Time For Goal Achievement: 10/03/16 Potential to Achieve Goals: Good Progress towards PT goals: Progressing toward goals    Frequency    Min 3X/week      PT Plan Current plan remains appropriate    Co-evaluation             End of Session Equipment Utilized During Treatment: Gait belt Activity Tolerance: Patient tolerated treatment well Patient left: in chair;with call bell/phone within reach Nurse Communication: Mobility status PT Visit Diagnosis: Other abnormalities of gait and mobility (R26.89);Muscle weakness (generalized) (M62.81)     Time: 1320-1340 PT Time Calculation  (min) (ACUTE ONLY): 20 min  Charges:  $Gait Training: 8-22 mins                    G Codes:       [place], [place], [place] 09/26/2016, 2:19 PM

## 2016-09-27 LAB — CBC
HCT: 23 % — ABNORMAL LOW (ref 36.0–46.0)
HCT: 25.8 % — ABNORMAL LOW (ref 36.0–46.0)
Hemoglobin: 7.4 g/dL — ABNORMAL LOW (ref 12.0–15.0)
Hemoglobin: 8.1 g/dL — ABNORMAL LOW (ref 12.0–15.0)
MCH: 30.2 pg (ref 26.0–34.0)
MCH: 29.7 pg (ref 26.0–34.0)
MCHC: 32.2 g/dL (ref 30.0–36.0)
MCHC: 31.4 g/dL (ref 30.0–36.0)
MCV: 93.9 fL (ref 78.0–100.0)
MCV: 94.5 fL (ref 78.0–100.0)
PLATELETS: 214 10*3/uL (ref 150–400)
Platelets: 249 10*3/uL (ref 150–400)
RBC: 2.45 MIL/uL — ABNORMAL LOW (ref 3.87–5.11)
RBC: 2.73 MIL/uL — AB (ref 3.87–5.11)
RDW: 18 % — ABNORMAL HIGH (ref 11.5–15.5)
RDW: 18.2 % — ABNORMAL HIGH (ref 11.5–15.5)
WBC: 7.2 10*3/uL (ref 4.0–10.5)
WBC: 8 10*3/uL (ref 4.0–10.5)

## 2016-09-27 LAB — BASIC METABOLIC PANEL
Anion gap: 7 (ref 5–15)
BUN: 16 mg/dL (ref 6–20)
CHLORIDE: 103 mmol/L (ref 101–111)
CO2: 29 mmol/L (ref 22–32)
CREATININE: 0.48 mg/dL (ref 0.44–1.00)
Calcium: 8.8 mg/dL — ABNORMAL LOW (ref 8.9–10.3)
GFR calc Af Amer: >60 mL/min (ref >60)
GLUCOSE: 91 mg/dL (ref 65–99)
POTASSIUM: 3.5 mmol/L (ref 3.5–5.1)
SODIUM: 139 mmol/L (ref 135–145)

## 2016-09-27 LAB — GLUCOSE, CAPILLARY: Glucose-Capillary: 100 mg/dL — ABNORMAL HIGH (ref 65–99)

## 2016-09-27 LAB — MAGNESIUM: MAGNESIUM: 1.9 mg/dL (ref 1.7–2.4)

## 2016-09-27 MED ORDER — HEPARIN SOD (PORK) LOCK FLUSH 100 UNIT/ML IV SOLN
500.0000 [IU] | INTRAVENOUS | Status: AC | PRN
  Administered 2016-09-27: 500 [IU]

## 2016-09-27 MED ORDER — ENOXAPARIN SODIUM 100 MG/ML ~~LOC~~ SOLN: 1.0000 mg/kg | Freq: Two times a day (BID) | SUBCUTANEOUS | 0 refills | Status: DC

## 2016-09-27 NOTE — Discharge Instructions (Signed)
RETURN TO THE EMERGENCY ROOM IMMEDIATELY IF YOU DEVELOP DIZZY SPELLS, SEVERE LIGHTHEADEDNESS, PASSING OUT SPELLS, OR SEVERE ACUTE WEAKNESS.    Pulmonary Embolism A pulmonary embolism (PE) is a sudden blockage or decrease of blood flow in one lung or both lungs. Most blockages come from a blood clot that travels from the legs or the pelvis to the lungs. PE is a dangerous and potentially life-threatening condition if it is not treated right away. What are the causes? A pulmonary embolism occurs most commonly when a blood clot travels from one of your veins to your lungs. Rarely, PE is caused by air, fat, amniotic fluid, or part of a tumor traveling through your veins to your lungs. What increases the risk? A PE is more likely to develop in:  People who smoke.  People who areolder, especially over 110 years of age.  People who are overweight (obese).  People who sit or lie still for a long time, such as during long-distance travel (over 4 hours), bed rest, hospitalization, or during recovery from certain medical conditions like a stroke.  People who do not engage in much physical activity (sedentary lifestyle).  People who have chronic breathing disorders.  People whohave a personal or family history of blood clots or blood clotting disease.  People whohave peripheral vascular disease (PVD), diabetes, or some types of cancer.  People who haveheart disease, especially if the person had a recent heart attack or has congestive heart failure.  People who have neurological diseases that affect the legs (leg paresis).  People who have had a traumatic injury, such as breaking a hip or leg.  People whohave recently had major or lengthy surgery, especially on the hip, knee, or abdomen.  People who have hada central line placed inside a large vein.  People who takemedicines that contain the hormone estrogen. These include birth control pills and hormone replacement therapy.  Pregnancy  or during childbirth or the postpartum period. What are the signs or symptoms? The symptoms of a PE usually start suddenly and include:  Shortness of breath while active or at rest.  Coughing or coughing up blood or blood-tinged mucus.  Chest pain that is often worse with deep breaths.  Rapid or irregular heartbeat.  Feeling light-headed or dizzy.  Fainting.  Feelinganxious.  Sweating. There may also be pain and swelling in a leg if that is where the blood clot started. These symptoms may represent a serious problem that is an emergency. Do not wait to see if the symptoms will go away. Get medical help right away. Call your local emergency services (911 in the [place] Do not drive yourself to the hospital.  How is this diagnosed? Your health care provider will take a medical history and perform a physical exam. You may also have other tests, including:  Blood tests to assess the clotting properties of your blood, assess oxygen levels in your blood, and find blood clots.  Imaging tests, such as CT, ultrasound, MRI, X-ray, and other tests to see if you have clots anywhere in your body.  An electrocardiogram (ECG) to look for heart strain from blood clots in the lungs. How is this treated? The main goals of PE treatment are:  To stop a blood clot from growing larger.  To stop new blood clots from forming. The type of treatment that you receive depends on many factors, such as the cause of your PE, your risk for bleeding or developing more clots, and other medical conditions that you have.  Sometimes, a combination of treatments is necessary. This condition may be treated with:  Medicines, including newer oral blood thinners (anticoagulants), warfarin, low molecular weight heparins, thrombolytics, or heparins.  Wearing compression stockings or using different types of devices.  Surgery (rare) to remove the blood clot or to place a filter in your abdomen to stop the blood clot  from traveling to your lungs. Treatments for a PE are often divided into immediate treatment, long-term treatment (up to 3 months after PE), and extended treatment (more than 3 months after PE). Your treatment may continue for several months. This is called maintenance therapy, and it is used to prevent the forming of new blood clots. You can work with your health care provider to choose the treatment program that is best for you. What are anticoagulants?  Anticoagulants are medicines that treat PEs. They can stop current blood clots from growing and stop new clots from forming. They cannot dissolve existing clots. Your body dissolves clots by itself over time. Anticoagulants are given by mouth, by injection, or through an IV tube. What are thrombolytics?  Thrombolytics are clot-dissolving medicines that are used to dissolve a PE. They carry a high risk of bleeding, so they tend to be used only in severe cases or if you have very low blood pressure. Follow these instructions at home: If you are taking a newer oral anticoagulant:   Take the medicine every single day at the same time each day.  Understand what foods and drugs interact with this medicine.  Understand that there are no regular blood tests required when using this medicine.  Understandthe side effects of this medicine, including excessive bruising or bleeding. Ask your health care provider or pharmacist about other possible side effects. If you are taking warfarin:   Understand how to take warfarin and know which foods can affect how warfarin works in Veterinary surgeon.  Understand that it is dangerous to taketoo much or too little warfarin. Too much warfarin increases the risk of bleeding. Too little warfarin continues to allow the risk for blood clots.  Follow your PT and INR blood testing schedule. The PT and INR results allow your health care provider to adjust your dose of warfarin. It is very important that you have your PT and INR  tested as often as told by your health care provider.  Avoid major changes in your diet, or tell your health care provider before you change your diet. Arrange a visit with a registered dietitian to answer your questions. Many foods, especially foods that are high in vitamin K, can interfere with warfarin and affect the PT and INR results. Eat a consistent amount of foods that are high in vitamin K, such as:  Spinach, kale, broccoli, cabbage, collard greens, turnip greens, Brussels sprouts, peas, cauliflower, seaweed, and parsley.  Beef liver and pork liver.  Green tea.  Soybean oil.  Tell your health care provider about any and all medicines, vitamins, and supplements that you take, including aspirin and other over-the-counter anti-inflammatory medicines. Be especially cautious with aspirin and anti-inflammatory medicines. Do not take those before you ask your health care provider if it is safe to do so. This is important because many medicines can interfere with warfarin and affect the PT and INR results.  Do not start or stop taking any over-the-counter or prescription medicine unless your health care provider or pharmacist tells you to do so. If you take warfarin, you will also need to do these things:  Hold pressure  over cuts for longer than usual.  Tell your dentist and other health care providers that you are taking warfarin before you have any procedures in which bleeding may occur.  Avoid alcohol or drink very small amounts. Tell your health care provider if you change your alcohol intake.  Do not use tobacco products, including cigarettes, chewing tobacco, and e-cigarettes. If you need help quitting, ask your health care provider.  Avoid contact sports. General instructions   Take over-the-counter and prescription medicines only as told by your health care provider. Anticoagulant medicines can have side effects, including easy bruising and difficulty stopping bleeding. If you  are prescribed an anticoagulant, you will also need to do these things:  Hold pressure over cuts for longer than usual.  Tell your dentist and other health care providers that you are taking anticoagulants before you have any procedures in which bleeding may occur.  Avoid contact sports.  Wear a medical alert bracelet or carry a medical alert card that says you have had a PE.  Ask your health care provider how soon you can go back to your normal activities. Stay active to prevent new blood clots from forming.  Make sure to exercise while traveling or when you have been sitting or standing for a long period of time. It is very important to exercise. Exercise your legs by walking or by tightening and relaxing your leg muscles often. Take frequent walks.  Wear compression stockings as told by your health care provider to help prevent more blood clots from forming.  Do not use tobacco products, including cigarettes, chewing tobacco, and e-cigarettes. If you need help quitting, ask your health care provider.  Keep all follow-up appointments with your health care provider. This is important. How is this prevented? Take these actions to decrease your risk of developing another PE:  Exercise regularly. For at least 30 minutes every day, engage in:  Activity that involves moving your arms and legs.  Activity that encourages good blood flow through your body by increasing your heart rate.  Exercise your arms and legs every hour during long-distance travel (over 4 hours). Drink plenty of water and avoid drinking alcohol while traveling.  Avoid sitting or lying in bed for long periods of time without moving your legs.  Maintain a weight that is appropriate for your height. Ask your health care provider what weight is healthy for you.  If you are a woman who is over 40 years of age, avoid unnecessary use of medicines that contain estrogen. These include birth control pills.  Do not smoke,  especially if you take estrogen medicines. If you need help quitting, ask your health care provider.  If you are at very high risk for PE, wear compression stockings.  If you recently had a PE, have regularly scheduled ultrasound testing on your legs to check for new blood clots. If you are hospitalized, prevention measures may include:  Early walking after surgery, as soon as your health care provider says that it is safe.  Receiving anticoagulants to prevent blood clots. If you cannot take anticoagulants, other options may be available, such as wearing compression stockings or using different types of devices. Get help right away if:  You have new or increased pain, swelling, or redness in an arm or leg.  You have numbness or tingling in an arm or leg.  You have shortness of breath while active or at rest.  You have chest pain.  You have a rapid or irregular heartbeat.  You feel light-headed or dizzy.  You cough up blood.  You notice blood in your vomit, bowel movement, or urine.  You have a fever. These symptoms may represent a serious problem that is an emergency. Do not wait to see if the symptoms will go away. Get medical help right away. Call your local emergency services (911 in the [place] Do not drive yourself to the hospital. This information is not intended to replace advice given to you by your health care provider. Make sure you discuss any questions you have with your health care provider. Document Released: 05/30/2000 Document Revised: 11/08/2015 Document Reviewed: 09/27/2014 Elsevier Interactive Patient Education  2017 [employer].

## 2016-09-27 NOTE — Progress Notes (Signed)
D/c instructions discussed with patient. All questions answered and pt verbalized understanding. Pt d/c home via a personal vehicle.

## 2016-09-27 NOTE — Discharge Summary (Addendum)
DISCHARGE SUMMARY  EVEREST HACKING  MR#: [id number]  DOB:May 06, 1946  Date of Admission: 09/17/2016 Date of Discharge: 09/27/2016  Attending 64 T  Patient's PCP:No PCP Per Patient  Consults: Oncology  Neurosurgery   Disposition: D/C home   Follow-up Appts: Follow-up Information    Chauncey Cruel, MD. Go on 09/29/2016.   Specialty:  Oncology Contact information: [address] [place] [address] [place].   Why:  home health agency and provider of [facility] (over the commode seat) Contact information: [address] 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  CT of the head suggested focal hypoattenuation within the left cerebellum concerning for possible acute ischemic stroke. Neurology was consulted and MRI was negative for stroke or any metastatic disease in her brain. CT of the chest revealed pulmonary embolism with evidence of right-sided heart strain. She was started on heparin drip and then transitioned to Lovenox 1 mg-kilogram every 12 hours as needed.  Hospital Course:  Altered mental status - encephalopathy - acute  hydrocephalus  -present at time of admit, then recurred 4/9 -CT head at admission showed questionable acute ischemia therefore MRI of the brain was done which was negative for any acute pathology including any metastatic disease -repeat CT head 4/9 negative for ICH but noted acutely enlarged ventricles > repeat MRI confirmed acute hydrocephalus but etiology was not clear (Radiology felt there was evidence of blood products in ventricular system, but NS did not feel this was the case)  -EEG negative -pt again recovered fully w/o specific intervention, and mental status remained normal/baseline th/o remainder of hospital stay   -NS followed - no indication for intervention at this time -f/u CT head 4/13 noted much improved hydrocephalus  -LP was considered, but was ultimately cancelled as her mental status recovered   Submassive pulmonary embolism with right heart strain -heparin drip transitioned to Lovenox but then anticoag stopped on 4/9 due to concerns for ICH -Upper extremity and lower extremity Dopplers negative for DVT -resume anticoag today w/ IV heparin w/ plan to transition back to lovenox if remains stable on heparin for 24hrs   ?Bacteremia - 1 of 2 blood cx + for Staph lugdunensis -appears to most likely be a contaminant - no clinical evidence of ongoing infection at time of d/c    Anemia - R buttock hematoma  -likely exacerbated by anticoag having suffered a fall onto the buttocks at home prior to admit  -s/p 2U PRBC 4/8  -resumed anticoag w/ IV heparin to assure remained stabe, then transitioned back to lovenox -Hgb slowly trending down at time of d/c, likely somewhat from dilution w/ improved oral intake - will need f/u as outpt and has appointment in 48hrs  so will not prolong hospital stay further  -pt counseled to return to ED for sx of hypovolemia   Recent Labs Lab 09/25/16 0211 09/25/16 2307 09/26/16 0253 09/27/16 0340 09/27/16 0949  HGB 8.3* 8.4* 8.4* 7.4* 8.1*     Left upper extremity swelling/redness - cellulitis v/s lymphedema -Dopplers negative -Keflex 5 day course completed  -lymphedema sleeve  -elevate arm   Chronic diastolic congestive heart failure -no evidence of signif volume overload at time of d/c   Hypertension -BP controlled at time of d/c   GERD -cont Protonix   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 Jana Hakim - tentative appts on 4/16 and 10/09/16   Allergies as of 09/27/2016   No Known Allergies     Medication List    TAKE these medications   anastrozole 1 MG tablet Commonly known as:  ARIMIDEX Take 1 tablet (1 mg total) by mouth daily.   b complex vitamins tablet Take 1 tablet by mouth daily.   cholecalciferol 1000 units tablet Commonly known as:  VITAMIN D Take 1 tablet (1,000 Units total) by mouth daily.   docusate sodium 100 MG capsule Commonly known as:  COLACE Take 1 capsule (100 mg total) by mouth 2 (two) times daily.   enoxaparin 100 MG/ML injection Commonly known as:  LOVENOX Inject 0.95 mLs (95 mg total) into the skin every 12 (twelve) hours.   furosemide 20 MG tablet Commonly known as:  LASIX Take 1 tablet (20 mg total) by mouth daily as needed. What changed:  reasons to take this   magnesium oxide 400 (241.3 Mg) MG tablet Commonly known as:  MAG-OX Take 1 tablet (400 mg total) by mouth daily.   multivitamins with iron Tabs tablet Take 1 tablet by mouth daily.   pantoprazole 40 MG tablet Commonly known as:  PROTONIX Take 1 tablet (40 mg total) by mouth daily as needed.   spironolactone 25 MG tablet Commonly known as:  ALDACTONE Take 0.5 tablets (12.5 mg total) by mouth daily.       Day of Discharge BP (!) 129/42 (BP Location: Left Leg)   Pulse 69   Temp 98.3 F (36.8 C) (Oral)   Resp 16   Ht 5\' 9"  (1.753 m)   Wt 96.8 kg (213 lb 6.5 oz)   SpO2 94%    BMI 31.51 kg/m   Physical Exam: General: No acute respiratory distress Lungs: Clear to auscultation bilaterally without wheezes or crackles Cardiovascular: Regular rate and rhythm without murmur gallop or rub normal S1 and S2 Abdomen: Nontender, nondistended, soft, bowel sounds positive, no rebound, no ascites, no appreciable mass Extremities: No significant cyanosis, clubbing, or edema bilateral lower extremities  Basic Metabolic Panel:  Recent Labs Lab 09/23/16 1157 09/24/16 0644 09/25/16 0211 09/26/16 0253 09/27/16 0340  NA 141 142 139 140 139  K 3.6 3.5 3.6 3.5 3.5  CL 106 107 107 104 103  CO2 26 28 25 26 29   GLUCOSE 110* 95 92 120* 91  BUN 15 16 15 16 16   CREATININE 0.47 0.42* 0.47 0.45 0.48  CALCIUM 8.9 8.7* 8.5* 9.2 8.8*  MG  --  2.1  --  2.0 1.9    Liver Function Tests:  Recent Labs Lab 09/22/16 1207  AST 67*  ALT 38  ALKPHOS 108  BILITOT 1.5*  PROT 6.5  ALBUMIN 2.6*    Recent Labs Lab 09/22/16 1207  AMMONIA 18  CBC:  Recent Labs Lab 09/24/16 2217 09/25/16 0211 09/25/16 2307 09/26/16 0253 09/27/16 0340 09/27/16 0949  WBC 8.1 8.2  --  8.6 7.2 8.0  HGB 8.2* 8.3* 8.4* 8.4* 7.4* 8.1*  HCT 26.2* 25.9* 25.7* 26.5* 23.0* 25.8*  MCV 93.6 93.8  --  94.0 93.9 94.5  PLT 180 196  --  205 214 249    CBG:  Recent Labs Lab 09/25/16 0328 09/25/16 0844 09/25/16 1752 09/26/16 0830 09/27/16 0628  GLUCAP 103* 96 143* 91 100*    Recent Results (from the past 240 hour(s))  Blood culture (routine x 2)     Status: Abnormal   Collection Time: 09/17/16  9:10 PM  Result Value Ref Range Status   Specimen Description BLOOD RIGHT ANTECUBITAL  Final   Special Requests   Final    BOTTLES DRAWN AEROBIC AND ANAEROBIC Blood Culture adequate volume   Culture  Setup Time   Final    GRAM POSITIVE COCCI IN CLUSTERS IN BOTH AEROBIC AND ANAEROBIC BOTTLES CRITICAL RESULT CALLED TO, READ BACK BY AND VERIFIED WITH: C BALL,PHARMD AT 1759 09/20/16 BY L BENFIELD     Culture STAPHYLOCOCCUS LUGDUNENSIS (A)  Final   Report Status 09/22/2016 FINAL  Final   Organism ID, Bacteria STAPHYLOCOCCUS LUGDUNENSIS  Final      Susceptibility   Staphylococcus lugdunensis - MIC*    CIPROFLOXACIN <=0.5 SENSITIVE Sensitive     ERYTHROMYCIN <=0.25 SENSITIVE Sensitive     GENTAMICIN <=0.5 SENSITIVE Sensitive     OXACILLIN 1 SENSITIVE Sensitive     TETRACYCLINE <=1 SENSITIVE Sensitive     VANCOMYCIN <=0.5 SENSITIVE Sensitive     TRIMETH/SULFA <=10 SENSITIVE Sensitive     CLINDAMYCIN <=0.25 SENSITIVE Sensitive     RIFAMPIN <=0.5 SENSITIVE Sensitive     Inducible Clindamycin NEGATIVE Sensitive     * STAPHYLOCOCCUS LUGDUNENSIS  Blood Culture ID Panel (Reflexed)     Status: Abnormal   Collection Time: 09/17/16  9:10 PM  Result Value Ref Range Status   Enterococcus species NOT DETECTED NOT DETECTED Final   Listeria monocytogenes NOT DETECTED NOT DETECTED Final   Staphylococcus species DETECTED (A) NOT DETECTED Final    Comment: Methicillin (oxacillin) susceptible coagulase negative staphylococcus. Possible blood culture contaminant (unless isolated from more than one blood culture draw or clinical case suggests pathogenicity). No antibiotic treatment is indicated for blood  culture contaminants. CRITICAL RESULT CALLED TO, READ BACK BY AND VERIFIED WITH: C BALL,PHARMD AT 1759 09/20/16 BY L BENFIELD    Staphylococcus aureus NOT DETECTED NOT DETECTED Final   Methicillin resistance NOT DETECTED NOT DETECTED Final   Streptococcus species NOT DETECTED NOT DETECTED Final   Streptococcus agalactiae NOT DETECTED NOT DETECTED Final   Streptococcus pneumoniae NOT DETECTED NOT DETECTED Final   Streptococcus pyogenes NOT DETECTED NOT DETECTED Final   Acinetobacter baumannii NOT DETECTED NOT DETECTED Final   Enterobacteriaceae species NOT DETECTED NOT DETECTED Final   Enterobacter cloacae complex NOT DETECTED NOT DETECTED Final   Escherichia coli NOT DETECTED NOT DETECTED Final    Klebsiella oxytoca NOT DETECTED NOT DETECTED Final   Klebsiella pneumoniae NOT DETECTED NOT DETECTED Final   Proteus species NOT DETECTED NOT DETECTED Final   Serratia marcescens NOT DETECTED NOT DETECTED Final   Haemophilus influenzae NOT DETECTED NOT DETECTED Final   Neisseria meningitidis NOT DETECTED NOT DETECTED Final   Pseudomonas aeruginosa NOT DETECTED NOT DETECTED Final   Candida albicans NOT DETECTED NOT DETECTED Final   Candida glabrata NOT DETECTED NOT DETECTED  Final   Candida krusei NOT DETECTED NOT DETECTED Final   Candida parapsilosis NOT DETECTED NOT DETECTED Final   Candida tropicalis NOT DETECTED NOT DETECTED Final  Blood culture (routine x 2)     Status: None   Collection Time: 09/17/16  9:15 PM  Result Value Ref Range Status   Specimen Description BLOOD RIGHT HAND  Final   Special Requests   Final    BOTTLES DRAWN AEROBIC AND ANAEROBIC Blood Culture adequate volume   Culture NO GROWTH 6 DAYS  Final   Report Status 09/23/2016 FINAL  Final  MRSA PCR Screening     Status: None   Collection Time: 09/18/16 10:20 AM  Result Value Ref Range Status   MRSA by PCR NEGATIVE NEGATIVE Final    Comment:        The GeneXpert MRSA Assay (FDA approved for NASAL specimens only), is one component of a comprehensive MRSA colonization surveillance program. It is not intended to diagnose MRSA infection nor to guide or monitor treatment for MRSA infections.      Time spent in discharge (includes decision making & examination of pt): >35 minutes  09/27/2016, 10:18 AM   Cherene Altes, MD [facility] Office  [phone] Pager [phone]  On-Call/Text Page:      [email or web address]      password [facility]

## 2016-09-29 ENCOUNTER — Other Ambulatory Visit: Payer: Self-pay

## 2016-09-29 ENCOUNTER — Ambulatory Visit: Payer: Self-pay | Admitting: Adult Health

## 2016-09-29 MED FILL — PANTOPRAZOLE SOD DR 40 MG T: 40 | 30 days supply | Qty: 30 | Fill #0

## 2016-10-03 ENCOUNTER — Telehealth: Payer: Self-pay

## 2016-10-03 NOTE — Telephone Encounter (Signed)
VM received from [place], pt's physical therapist, with concerns over left arm swelling and questions regarding medication coverage.  VM left with pt to return call.

## 2016-10-06 ENCOUNTER — Telehealth: Payer: Self-pay

## 2016-10-06 ENCOUNTER — Other Ambulatory Visit: Payer: Self-pay

## 2016-10-06 NOTE — Telephone Encounter (Signed)
LVM with pt's son to call asap regarding lovenox prescription

## 2016-10-06 NOTE — Telephone Encounter (Signed)
LVM with pt regarding message received from [facility] stating pt was unable to pick up her script for Lovenox.

## 2016-10-07 ENCOUNTER — Telehealth: Payer: Self-pay

## 2016-10-07 ENCOUNTER — Other Ambulatory Visit: Payer: Self-pay

## 2016-10-07 MED ORDER — ENOXAPARIN SODIUM 100 MG/ML ~~LOC~~ SOLN: 1.0000 mg/kg | Freq: Two times a day (BID) | SUBCUTANEOUS | 0 refills | Status: DC

## 2016-10-07 NOTE — Telephone Encounter (Signed)
LVM on pt's phone and her son's phone regarding issue with lovenox prescription.  Instructed them to call this office asap so we can work on getting her medication.

## 2016-10-07 NOTE — Telephone Encounter (Signed)
Lovenox script resent to [facility] per Dr Jana Hakim.  Stefanie Libel working on authorization.  LVM on pt's phone and her son's phone with update.

## 2016-10-08 ENCOUNTER — Encounter: Payer: Self-pay | Admitting: Oncology

## 2016-10-08 ENCOUNTER — Ambulatory Visit: Payer: Self-pay | Admitting: Radiation Oncology

## 2016-10-08 MED FILL — SPIRONOLACTONE 25 MG TABLET: 25 | 30 days supply | Qty: 15 | Fill #1

## 2016-10-08 NOTE — Progress Notes (Signed)
Called [facility] to follow up if a rejection had been sent requiring a PA for Lovenox. Spoke with Marita [place] who states that a prior Josem Kaufmann was not needed. The insurance will cover only a small amount and the rest is the responsibility of the patient to satisfy the patient's OOP. Asked Marita [place] if there was any special pricing discount available if the patient was approved for [employer]. She states that would only be for uninsured patients. Called patient and left [phone] to return my call with my name and number. I will see if she can bring proof of income to apply for [employer] to cover medication cost.

## 2016-10-09 ENCOUNTER — Other Ambulatory Visit: Payer: Self-pay

## 2016-10-09 ENCOUNTER — Ambulatory Visit (HOSPITAL_BASED_OUTPATIENT_CLINIC_OR_DEPARTMENT_OTHER): Payer: Medicare Other | Admitting: Oncology

## 2016-10-09 ENCOUNTER — Telehealth: Payer: Self-pay

## 2016-10-09 ENCOUNTER — Other Ambulatory Visit (HOSPITAL_BASED_OUTPATIENT_CLINIC_OR_DEPARTMENT_OTHER): Payer: Medicare Other

## 2016-10-09 ENCOUNTER — Ambulatory Visit (HOSPITAL_BASED_OUTPATIENT_CLINIC_OR_DEPARTMENT_OTHER): Payer: Medicare Other

## 2016-10-09 VITALS — BP 124/56 | HR 78 | Temp 98.1°F | Resp 18

## 2016-10-09 VITALS — BP 126/56 | HR 77 | Temp 98.1°F | Resp 18 | Ht 69.0 in | Wt 209.0 lb

## 2016-10-09 DIAGNOSIS — R296 Repeated falls: Secondary | ICD-10-CM

## 2016-10-09 DIAGNOSIS — C7951 Secondary malignant neoplasm of bone: Secondary | ICD-10-CM

## 2016-10-09 DIAGNOSIS — Z170 Estrogen receptor positive status [ER+]: Principal | ICD-10-CM

## 2016-10-09 DIAGNOSIS — C50919 Malignant neoplasm of unspecified site of unspecified female breast: Secondary | ICD-10-CM

## 2016-10-09 DIAGNOSIS — C50812 Malignant neoplasm of overlapping sites of left female breast: Principal | ICD-10-CM

## 2016-10-09 DIAGNOSIS — Z79811 Long term (current) use of aromatase inhibitors: Secondary | ICD-10-CM | POA: Diagnosis not present

## 2016-10-09 DIAGNOSIS — R0602 Shortness of breath: Secondary | ICD-10-CM

## 2016-10-09 DIAGNOSIS — C50911 Malignant neoplasm of unspecified site of right female breast: Secondary | ICD-10-CM

## 2016-10-09 DIAGNOSIS — C50912 Malignant neoplasm of unspecified site of left female breast: Secondary | ICD-10-CM | POA: Diagnosis not present

## 2016-10-09 DIAGNOSIS — J910 Malignant pleural effusion: Secondary | ICD-10-CM

## 2016-10-09 DIAGNOSIS — Z5112 Encounter for antineoplastic immunotherapy: Secondary | ICD-10-CM

## 2016-10-09 DIAGNOSIS — C7800 Secondary malignant neoplasm of unspecified lung: Secondary | ICD-10-CM

## 2016-10-09 DIAGNOSIS — I519 Heart disease, unspecified: Secondary | ICD-10-CM | POA: Diagnosis not present

## 2016-10-09 DIAGNOSIS — C7931 Secondary malignant neoplasm of brain: Secondary | ICD-10-CM

## 2016-10-09 DIAGNOSIS — I2690 Septic pulmonary embolism without acute cor pulmonale: Secondary | ICD-10-CM

## 2016-10-09 DIAGNOSIS — C7802 Secondary malignant neoplasm of left lung: Secondary | ICD-10-CM

## 2016-10-09 DIAGNOSIS — C50811 Malignant neoplasm of overlapping sites of right female breast: Secondary | ICD-10-CM

## 2016-10-09 DIAGNOSIS — I2699 Other pulmonary embolism without acute cor pulmonale: Secondary | ICD-10-CM

## 2016-10-09 LAB — COMPREHENSIVE METABOLIC PANEL
ALBUMIN: 3 g/dL — AB (ref 3.5–5.0)
ALT: 27 U/L (ref 0–55)
ANION GAP: 10 meq/L (ref 3–11)
AST: 40 U/L — AB (ref 5–34)
Alkaline Phosphatase: 130 U/L (ref 40–150)
BILIRUBIN TOTAL: 1.43 mg/dL — AB (ref 0.20–1.20)
BUN: 16 mg/dL (ref 7.0–26.0)
CALCIUM: 9.5 mg/dL (ref 8.4–10.4)
CO2: 26 mEq/L (ref 22–29)
CREATININE: 0.7 mg/dL (ref 0.6–1.1)
Chloride: 106 mEq/L (ref 98–109)
EGFR: 88 mL/min/{1.73_m2} — ABNORMAL LOW (ref >90)
Glucose: 128 mg/dl (ref 70–140)
Potassium: 3.6 mEq/L (ref 3.5–5.1)
Sodium: 142 mEq/L (ref 136–145)
TOTAL PROTEIN: 7.6 g/dL (ref 6.4–8.3)

## 2016-10-09 LAB — CBC WITH DIFFERENTIAL/PLATELET
BASO%: 0.9 % (ref 0.0–2.0)
Basophils Absolute: 0 10*3/uL (ref 0.0–0.1)
EOS%: 2.1 % (ref 0.0–7.0)
Eosinophils Absolute: 0.1 10*3/uL (ref 0.0–0.5)
HEMATOCRIT: 30.1 % — AB (ref 34.8–46.6)
HEMOGLOBIN: 10 g/dL — AB (ref 11.6–15.9)
LYMPH#: 0.9 10*3/uL (ref 0.9–3.3)
LYMPH%: 21.4 % (ref 14.0–49.7)
MCH: 32.5 pg (ref 25.1–34.0)
MCHC: 33.1 g/dL (ref 31.5–36.0)
MCV: 98.1 fL (ref 79.5–101.0)
MONO#: 0.6 10*3/uL (ref 0.1–0.9)
MONO%: 12.7 % (ref 0.0–14.0)
NEUT%: 62.9 % (ref 38.4–76.8)
NEUTROS ABS: 2.8 10*3/uL (ref 1.5–6.5)
PLATELETS: 215 10*3/uL (ref 145–400)
RBC: 3.07 10*6/uL — ABNORMAL LOW (ref 3.70–5.45)
RDW: 21.7 % — AB (ref 11.2–14.5)
WBC: 4.4 10*3/uL (ref 3.9–10.3)

## 2016-10-09 MED ORDER — ACETAMINOPHEN 325 MG PO TABS
650.0000 mg | ORAL_TABLET | Freq: Once | ORAL | Status: AC
  Administered 2016-10-09: 650 mg via ORAL

## 2016-10-09 MED ORDER — DIPHENHYDRAMINE HCL 25 MG PO CAPS
25.0000 mg | ORAL_CAPSULE | Freq: Once | ORAL | Status: AC
  Administered 2016-10-09: 25 mg via ORAL

## 2016-10-09 MED ORDER — SODIUM CHLORIDE 0.9 % IJ SOLN
10.0000 mL | INTRAMUSCULAR | Status: DC | PRN
  Administered 2016-10-09: 10 mL
  Filled 2016-10-09: qty 10

## 2016-10-09 MED ORDER — ZOLEDRONIC ACID 4 MG/100ML IV SOLN
4.0000 mg | Freq: Once | INTRAVENOUS | Status: AC
  Administered 2016-10-09: 4 mg via INTRAVENOUS
  Filled 2016-10-09: qty 100

## 2016-10-09 MED ORDER — RIVAROXABAN (XARELTO) VTE STARTER PACK (15 & 20 MG): ORAL_TABLET | ORAL | 0 refills | Status: DC

## 2016-10-09 MED ORDER — HEPARIN SOD (PORK) LOCK FLUSH 100 UNIT/ML IV SOLN
500.0000 [IU] | Freq: Once | INTRAVENOUS | Status: AC | PRN
  Administered 2016-10-09: 500 [IU]
  Filled 2016-10-09: qty 5

## 2016-10-09 MED ORDER — SODIUM CHLORIDE 0.9 % IV SOLN
Freq: Once | INTRAVENOUS | Status: AC
  Administered 2016-10-09: 11:00:00 via INTRAVENOUS

## 2016-10-09 MED ORDER — ADO-TRASTUZUMAB EMTANSINE CHEMO INJECTION 160 MG
3.5000 mg/kg | Freq: Once | INTRAVENOUS | Status: AC
  Administered 2016-10-09: 360 mg via INTRAVENOUS
  Filled 2016-10-09: qty 8

## 2016-10-09 MED ORDER — ACETAMINOPHEN 325 MG PO TABS
ORAL_TABLET | ORAL | Status: AC
  Filled 2016-10-09: qty 2

## 2016-10-09 MED ORDER — DIPHENHYDRAMINE HCL 25 MG PO CAPS
ORAL_CAPSULE | ORAL | Status: AC
  Filled 2016-10-09: qty 1

## 2016-10-09 MED FILL — XARELTO STARTER PACK: 15 & 20 | 30 days supply | Qty: 51 | Fill #0

## 2016-10-09 NOTE — Progress Notes (Signed)
Pt did not stay for 30 post observation, her ride was waiting. VSS at D/C

## 2016-10-09 NOTE — Telephone Encounter (Signed)
LVM on pt's home phone informing her that her wheelchair has been ordered and that she will need to go to the [facility]

## 2016-10-09 NOTE — Progress Notes (Signed)
ID: Linda [name]   DOB: 1945-07-27  MR#: [id number]  DXI#:[id number]  PCP: No PCP Per Patient GYN: SU: Coralie Keens OTHER MD: Crissie Reese, Linna Hoff Bensimhon  CHIEF COMPLAINT:  Bilateral Breast Cancers  CURRENT TREATMENT: Anastrozole, T-DM 1, zolendrnate, rivaroxaban  BREAST CANCER HISTORY: From the original intake note:  The patient noted a small amount of drainage from her left breast December of 2012. She brought it to her gynecologist's attention in January of 2013 and was set up for bilateral diagnostic mammography at the [facility] Center July 24, 2011. This was the patient's first ever mammogram. It showed a large irregular mass in the left retroareolar region extending to the nipple, with nipple retraction and skin thickening. This measured approximately 8.4 cm. It was associated with pleomorphic microcalcifications. By exam there was moderate distortion and retraction of the nipple with a large palpable ill-defined area in the retroareolar region. In the right right breast there was also a 2 cm hard mass palpated. Ultrasound of the right breast mass showed a complex cystic/solid area measuring 1.9 cm. Ultrasound of the right axilla was negative. Ultrasound of the left breast showed a large hypoechoic mass measuring at least 3.8 cm. The left axilla showed several adjacent abnormal appearing lymph nodes.  With this information biopsy of the right and left breast masses were obtained the same day, and showed ([id number])   (a) on the right, and invasive ductal carcinoma with papillary features, estrogen and progesterone receptor positive, HER-2 negative, with an MIB-1 of 10%.   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 The MIB-1 was 60% for the left-sided tumor.   With this information the patient was presented at the multidisciplinary breast cancer conference 08/06/2011. Subsequent  evaluation and treatments are as detailed below.  INTERVAL HISTORY: Linda [name] returns today for follow-up of her triple negative breast cancer accompanied by her firefighter son Gerald Stabs and her granddaughter Mel Almond. The interval history is significant for having been admitted for what proved to be pulmonary embolism 09/17/2016. She was anticoagulated and discharged on Lovenox 09/27/2016, but then her insurance would only partially cover the medication and she did not feel she could afford the $500 co-pay per month. Accordingly for the last 10 days she has not been anticoagulated.  As far as her breast cancer itself is concerned, she continues on anastrozole and T-DM 1, with no evidence of active disease. Note that in addition to the CT angiogram that she had on April 4 she had CT scans of the head without contrast 2 and an MRI of the brain with and without contrast 09/22/2016. She also had a CT scan of the abdomen and pelvis 09/24/2011.  REVIEW OF SYSTEMS: Linda [name] despite the favorable scans, pad is not doing well. She fell on Easter Sunday and then again fell yesterday in the timeout. She fell backwards. She did not hit her head. She tells me her urine is okay. She has some pain in her buttock where she fell before. Bowel movements are unremarkable. She has some memory problems. She is now formally retired. She does not drive. A detailed review of systems today was otherwise stable  PAST MEDICAL HISTORY: Past Medical History:  Diagnosis Date  . Acute on chronic diastolic CHF (congestive heart failure) ([place])   . Acute pulmonary embolism ([place]) 12/13/2014  . Allergy   . Blood transfusion without reported diagnosis   . Breast cancer ([place]) 07/30/11 dx  Right  invasive ductal ca 7 0'clock,& left breast=invasive ductal ca  and dcis, left axilla nlymph node, metastatic ca  . Bronchitis    hx  . Cancer ([place]) 08-13-11   07-31-11-Dx. Bilarteral Breast cancer-left greater than rt.  . Cancer of central portion of left  female breast ([place]) 08/01/2011  . Diabetes mellitus without complication ([place])   . GERD (gastroesophageal reflux disease)    doing well  . Hematuria, undiagnosed cause 08-13-11   Being evaluated by [facility] 08-14-11  . History of radiation therapy 02/20/12-04/15/12   left breast,total 60.4 Gy  . Hypertension   . Radiation-induced dermatitis 03/26/2012   Using radioplex cream, plus neosporin.   . S/P radiation therapy 10/09/14-10/27/14   whole brain 37.5Gy/4f  . Seasonal allergies   . Seroma 02/04/12   right breast  200cc removed  erythema on right side  . Wears partial dentures    wears upper and lower partial    PAST SURGICAL HISTORY: Past Surgical History:  Procedure Laterality Date  . BREAST SURGERY    . CHEST TUBE INSERTION Left 12/14/2014   Procedure: CHEST TUBE INSERTION;  Surgeon: CRexene Alberts MD;  [facility]  Service: Thoracic;  Laterality: Left;  . child birth  08-13-11   x3 -NVD  . MASTECTOMY W/ SENTINEL NODE BIOPSY  01/21/2012   Procedure: MASTECTOMY WITH SENTINEL LYMPH NODE BIOPSY;  Surgeon: DHarl Bowie MD;  [facility]  Service: General;  Laterality: Bilateral;  Left modified radical mastectomy, Rt mastectomy with Sentinel lymphnode biospy  . PORT-A-CATH REMOVAL Right 12/22/2012   Procedure: REMOVAL PORT-A-CATH;  Surgeon: DHarl Bowie MD;  [facility]  Service: General;  Laterality: Right;  . PORTACATH PLACEMENT  08/15/2011   Procedure: INSERTION PORT-A-CATH;  Surgeon: DHarl Bowie MD;  [facility];  Service: General;  Laterality: N/A;  . SUBXYPHOID PERICARDIAL WINDOW N/A 12/14/2014   Procedure: SUBXYPHOID PERICARDIAL WINDOW;  Surgeon: CRexene Alberts MD;  [facility]  Service: Thoracic;  Laterality: N/A;  . TEE WITHOUT CARDIOVERSION N/A 12/14/2014   Procedure: TRANSESOPHAGEAL ECHOCARDIOGRAM (TEE);  Surgeon: CRexene Alberts MD;  [facility];  Service: Thoracic;  Laterality: N/A;     FAMILY HISTORY Family History  Problem Relation Age of Onset  . Heart disease Mother   . Cancer Mother 416   breast, , 750deceased  . Heart attack Father   . Cancer Sister 631   breast  . Colon cancer Neg Hx   The patient's father died from a heart attack at the age of 830 The patient's mother died from apparently heart problems at the age of 82 The patient had no brothers. She has 3 sisters. One of her sisters was diagnosed with breast cancer in her mid 658s The patient does not know if his sister was ever genetically tested. The patient's mother had a mastectomy at the age of 340 presumably for breast cancer. There is no other history of breast or in cancer in the family to her knowledge.   GYNECOLOGIC HISTORY:  She does not recall when she had menarche. She had her first child at age 71 She is GX P3. She underwent menopause in her mid 452s She never took hormone replacement.   SOCIAL HISTORY:  She works seasonally for [employer] but is planning to retire as of April 1720. 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 She moved in with her son Gerald Stabs Sept 2017--he lives in [place] and works as a Airline pilot. His wife is a Marine scientist.there is a 80-monthold child in the house (as of March 2018) [facility] is a dAdvertising account executivein addition to having the m[employer] The patient attends a local [employer]here   ADVANCED DIRECTIVES: In place. The patient has opted for a DO NOT RESUSCITATE order. Her daughter BRoberto Scalesis her healthcare power of attorney. Neck affect can be reached at [phone]  HEALTH MAINTENANCE: Social History  Substance Use Topics  . Smoking status: Never Smoker  . Smokeless tobacco: Never Used  . Alcohol use 0.0 oz/week     Comment: rare- occ.     Colonoscopy: Never  PAP: Feb 2013  Bone density: November 2013, normal  Lipid panel:   No Known  Allergies  Current Outpatient Prescriptions  Medication Sig Dispense Refill  . anastrozole (ARIMIDEX) 1 MG tablet Take 1 tablet (1 mg total) by mouth daily. 90 tablet 3  . b complex vitamins tablet Take 1 tablet by mouth daily.    . cholecalciferol (VITAMIN D) 1000 UNITS tablet Take 1 tablet (1,000 Units total) by mouth daily. 100 tablet 12  . docusate sodium (COLACE) 100 MG capsule Take 1 capsule (100 mg total) by mouth 2 (two) times daily. 10 capsule 0  . enoxaparin (LOVENOX) 100 MG/ML injection Inject 0.95 mLs (95 mg total) into the skin every 12 (twelve) hours. 60 Syringe 0  . furosemide (LASIX) 20 MG tablet Take 1 tablet (20 mg total) by mouth daily as needed. (Patient taking differently: Take 20 mg by mouth daily as needed for fluid. ) 30 tablet 12  . magnesium oxide (MAG-OX) 400 (241.3 Mg) MG tablet Take 1 tablet (400 mg total) by mouth daily. 30 tablet 12  . Multiple Vitamins-Iron (MULTIVITAMINS WITH IRON) TABS Take 1 tablet by mouth daily.    . pantoprazole (PROTONIX) 40 MG tablet Take 1 tablet (40 mg total) by mouth daily as needed. 30 tablet 0  . Rivaroxaban 15 & 20 MG TBPK Take as directed on package: Start with one 154mtablet by mouth twice a day with food. On Day 22, switch to one 2038mablet once a day with food. 51 each 0  . spironolactone (ALDACTONE) 25 MG tablet Take 0.5 tablets (12.5 mg total) by mouth daily. 15 tablet 3   No current facility-administered medications for this visit.     OBJECTIVE: [name] Who appears stated age  Vit45 10/09/16 0949  BP: (!) 126/56  Pulse: 77  Resp: 18  Temp: 98.1 F (36.7 C)     Body mass index is 30.86 kg/m.    ECOG FS: 1 Filed Weights   10/09/16 0949  Weight: 209 lb (94.8 kg)    Sclerae unicteric, pupils round and equal, EOMs intact Oropharynx clear and moist No cervical or supraclavicular adenopathy Lungs no rales or rhonchi Heart regular rate and rhythm Abd soft, nontender, positive bowel sounds MSK  no focal spinal tenderness, no upper extremity lymphedema Neuro: nonfocal, well oriented, appropriate affect Breasts: Status post bilateral mastectomies.     LAB RESULTS: CBC Latest Ref Rng & Units 10/09/2016 09/27/2016 09/27/2016  WBC 3.9 - 10.3 10e3/uL 4.4 8.0 7.2  Hemoglobin 11.6 - 15.9 g/dL 10.0(L) 8.1(L) 7.4(L)  Hematocrit 34.8 - 46.6 % 30.1(L) 25.8(L) 23.0(L)  Platelets 145 - 400 10e3/uL 215 249 214  CMP Latest Ref Rng & Units 09/27/2016 09/26/2016 09/25/2016  Glucose 65 - 99 mg/dL 91 120(H) 92  BUN 6 - 20 mg/dL _0 Creatinine 0.44 - 1.00 mg/dL 0.48 0.45 0.47  Sodium 135 - 145 mmol/L 139 140 139  Potassium 3.5 - 5.1 mmol/L 3.5 3.5 3.6  Chloride 101 - 111 mmol/L 103 104 107  CO2 22 - 32 mmol/L _1 Calcium 8.9 - 10.3 mg/dL 8.8(L) 9.2 8.5(L)  Total Protein 6.5 - 8.1 g/dL - - -  Total Bilirubin 0.3 - 1.2 mg/dL - - -  Alkaline Phos 38 - 126 U/L - - -  AST 15 - 41 U/L - - -  ALT 14 - 54 U/L - - -    STUDIES: Ct Abdomen Pelvis Wo Contrast  Result Date: 09/23/2016 CLINICAL DATA:  New mid 1 week ago status post fall. Large hematoma to right buttock. Right buttock pain. EXAM: CT ABDOMEN AND PELVIS WITHOUT CONTRAST TECHNIQUE: Multidetector CT imaging of the abdomen and pelvis was performed following the standard protocol without IV contrast. COMPARISON:  None. FINDINGS: Lower chest: No acute findings. Hepatobiliary: Gallstones noted within the gallbladder. No focal hepatic abnormality. Pancreas: No focal abnormality or ductal dilatation. Spleen: No focal abnormality.  Normal size. Adrenals/Urinary Tract: No adrenal abnormality. No focal renal abnormality. No stones or hydronephrosis. Urinary bladder is unremarkable. Stomach/Bowel: Moderate stool burden in the colon. Stomach, large and small bowel grossly unremarkable. Vascular/Lymphatic: No evidence of aneurysm or adenopathy. Diffuse aortic calcifications. Reproductive: Uterus and adnexa unremarkable.  No mass. Other: No free fluid  or free air. There is a soft tissue hematoma within the medial right buttock subcutaneous soft tissues, extending into the right perineum adjacent to the rectum. The hematoma measures 12.8 x 5.9 cm. Musculoskeletal: No acute bony abnormality. Degenerative changes in the lumbar spine. IMPRESSION: Large hematoma in the medial right buttock soft tissues extending into the right perineum. Moderate stool in the colon. Aortic atherosclerosis. Electronically Signed   By: Rolm Baptise M.D.   On: 09/23/2016 11:44   Dg Chest 2 View  Result Date: 09/17/2016 CLINICAL DATA:  Syncopal episode last night, BILATERAL breast cancer undergoing treatment, former smoker, hypertension EXAM: CHEST  2 VIEW COMPARISON:  C7 2017 FINDINGS: RIGHT jugular Port-A-Cath with tip projecting over SVC. Minimal enlargement of cardiac silhouette. Atherosclerotic calcification aorta. Mediastinal contours and pulmonary vascularity normal. Lungs clear. No infiltrate, pleural effusion or pneumothorax. Prior BILATERAL mastectomy and LEFT axillary node dissection. Bones demineralized. IMPRESSION: No acute abnormalities. Mild enlargement cardiac silhouette. Aortic atherosclerosis. Electronically Signed   By: Lavonia Dana M.D.   On: 09/17/2016 19:37   Ct Head Wo Contrast  Result Date: 09/26/2016 CLINICAL DATA:  HYDROCEPHALUS EXAM: CT HEAD WITHOUT CONTRAST TECHNIQUE: Contiguous axial images were obtained from the base of the skull through the vertex without intravenous contrast. COMPARISON:  09/22/2016 FINDINGS: Brain: Interval decrease in hydrocephalus with no further dilatation of the temporal horns of the lateral ventricles. There is no midline shift. Periventricular white matter hypoattenuation, also improved. No acute intracranial hemorrhage, mass, or mass effect. Acute infarct may be inapparent on noncontrast CT. Vascular: Atherosclerotic and physiologic intracranial calcifications. Skull: Stable lytic lesion in the left frontal bone. Negative for  fracture or acute change. Sinuses/Orbits: Extensive opacification of left mastoid air cells as before. Right mastoid air cells and visualized paranasal sinuses appear normally developed and well aerated. Orbits unremarkable. Other: None IMPRESSION: 1. Improving hydrocephalus with interval decrease in white matter hypoattenuation. 2. Negative  for bleed or other acute intracranial process. 3. Persistent opacification of left mastoid air cells. Electronically Signed   By: Lucrezia Europe M.D.   On: 09/26/2016 08:55   Ct Head Wo Contrast  Addendum Date: 09/22/2016   ADDENDUM REPORT: 09/22/2016 09:34 ADDENDUM: Study discussed by telephone with Dr. Joette Catching on 09/22/2016 at 09:32 . He advises the patient is afebrile, with no leukocytosis, but with worsening mental status. We discussed repeat MRI without and with contrast in hopes of identifying the cause of the ventricular enlargement. Electronically Signed   By: Genevie Ann M.D.   On: 09/22/2016 09:34   Result Date: 09/22/2016 CLINICAL DATA:  71 year old female diagnosed with acute pulmonary embolism on 09/17/2016. 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 EXAM: CT HEAD WITHOUT CONTRAST TECHNIQUE: Contiguous axial images were obtained from the base of the skull through the vertex without intravenous contrast. COMPARISON:  Brain MRI without and with contrast 09/18/2016, and earlier. FINDINGS: Brain: No acute intracranial hemorrhage identified. No cortically based acute infarct identified. However, the lateral and third ventricles have increased in size since 09/18/2016. The temporal horns are now mildly dilated. There may be new transependymal edema, of uncertain as there is extensive underlying chronic cerebral white matter hypodensity from previous whole brain radiation. No intracranial mass effect or discrete mass lesion is identified. Basilar cisterns remain  patent. Vascular: Calcified atherosclerosis at the skull base. No suspicious intracranial vascular hyperdensity. Skull: Stable visualized osseous structures. Hyperostosis of the calvarium. An 8-9 mm round ground-glass density area in the left frontal bone on series 202, image 19 is stable over the prior studies and probably is a treated bone metastasis. No acute or suspicious osseous lesions identified. Sinuses/Orbits: Unchanged left, and trace right, mastoid effusions. Visualized paranasal sinuses are stable and well pneumatized. Other: Visualized orbits and scalp soft tissues are within normal limits. IMPRESSION: 1. Acute ventriculomegaly since 09/18/2016 of unclear etiology. Evidence of new transependymal edema, but no obstructing lesion or new brain mass identified on noncontrast CT. Query infectious ventriculitis. Repeat brain MRI without and with contrast recommended. 2. Sequelae of prior whole brain radiation. 3. 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 Electronically Signed: By: Genevie Ann M.D. On: 09/22/2016 09:20   Ct Head Wo Contrast  Addendum Date: 09/17/2016   ADDENDUM REPORT: 09/17/2016 22:44 ADDENDUM: Critical Value/emergent results were called by telephone at the time of interpretation on 09/17/2016 at 10:44 pm to Dr. Duffy Bruce , who verbally acknowledged these results. Electronically Signed   By: Ulyses Jarred M.D.   On: 09/17/2016 22:44   Result Date: 09/17/2016 CLINICAL DATA:  Dizziness and stage IV breast cancer EXAM: CT HEAD WITHOUT CONTRAST TECHNIQUE: Contiguous axial images were obtained from the base of the skull through the vertex without intravenous contrast. COMPARISON:  Brain MRI 05/19/2016 FINDINGS: Brain: There is focal hypoattenuation within the left cerebellar hemisphere. No intracranial hemorrhage. No midline shift or other mass effect. There is periventricular hypoattenuation compatible with chronic  microvascular disease. Vascular: No hyperdense vessel or unexpected calcification. Skull: Normal visualized skull base, calvarium and extracranial soft tissues. Sinuses/Orbits: The left mastoid air cells are opacified. The paranasal sinuses are clear. Normal orbits. IMPRESSION: 1. Focal hypoattenuation within the left cerebellum is concerning for acute ischemia, particularly in the context of reported dizziness. No associated hemorrhage or mass effect. MRI may be considered for confirmation. 2. Confluent white  matter hypoattenuation may be secondary to chronic microvascular ischemia or a post treatment effect. Electronically Signed: By: Ulyses Jarred M.D. On: 09/17/2016 22:39   Ct Angio Chest Pe W Or Wo Contrast  Result Date: 09/17/2016 CLINICAL DATA:  Left arm erythema and warmth starting last evening with dizziness onset last night. Stage IV breast cancer. EXAM: CT ANGIOGRAPHY CHEST WITH CONTRAST TECHNIQUE: Multidetector CT imaging of the chest was performed using the standard protocol during bolus administration of intravenous contrast. Multiplanar CT image reconstructions and MIPs were obtained to evaluate the vascular anatomy. CONTRAST:  100 cc Isovue 370 IV COMPARISON:  CT 05/06/2016 FINDINGS: Cardiovascular: Web-like filling defects in the segmental and subsegmental arteries to the lower lobes, best seen series 5, image 59 within the right lower lobe and series 5, image 52 in the left lower lobe. RV/LV ratio = 1.2. The heart is borderline enlarged without pericardial effusion. 4 cm ascending aortic aneurysm without dissection. There is aortic atherosclerosis and coronary arteriosclerosis. Mediastinum/Nodes: Small hiatal hernia. No mediastinal lymphadenopathy. Lungs/Pleura: Dependent atelectasis both lungs with scarring in the left upper lobe possibly radiation induced. No dominant mass within the lungs identified. No pulmonary consolidation or pneumothorax. Upper Abdomen: No acute abnormality.  Musculoskeletal: Port catheter projects over the anterior right chest wall with tip in the right atrium. Multifocal osteoblastic metastasis along the dorsal spine. Bilateral mastectomy changes. Left axillary lymph node dissection. Review of the MIP images confirms the above findings. IMPRESSION: 1. Positive for acute PE with CT evidence of right heart strain (RV/LV Ratio = 1.2) consistent with at least submassive (intermediate risk) PE. The presence of right heart strain has been associated with an increased risk of morbidity and mortality. Please activate Code PE by paging [phone]. Tiny web-like nonocclusive filling defects are seen to both lower lobes. Critical Value/emergent results were called by telephone at the time of interpretation on 09/17/2016 at 10:49 pm to the ED secretary with message to be relayed to Dr. Duffy Bruce. Direct telephone line left in case of any questions from this report. 2. Status post bilateral mastectomy with left upper lobe scarring and dependent atelectasis. 3. 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 Electronically Signed   By: Ashley Royalty M.D.   On: 09/17/2016 22:51   Mr Jeri Cos QI Contrast  Result Date: 09/22/2016 CLINICAL DATA:  Altered mental status. EXAM: MRI HEAD WITHOUT AND WITH CONTRAST TECHNIQUE: Multiplanar, multiecho pulse sequences of the brain and surrounding structures were obtained without and with intravenous contrast. CONTRAST:  72m MULTIHANCE GADOBENATE DIMEGLUMINE 529 MG/ML IV SOLN COMPARISON:  Head CT 09/22/2016 and MRI 09/18/2016 FINDINGS: Multiple sequences are mildly motion degraded. Brain: There is a thin curvilinear region of restricted diffusion in the midline along the splenium of the corpus callosum. No restricted diffusion is seen elsewhere. No mass, midline shift, or extra-axial fluid collection is identified. As described on today's earlier head CT, there is mild lateral and third  ventriculomegaly, including dilatation of the temporal horns. This is new from the recent prior MRI. The fourth ventricle is not dilated. There is confluent T2 hyperintensity throughout the periventricular white matter which has increased, most notably in the temporal lobes suggesting transependymal CSF flow superimposed on chronic white matter changes likely related to prior radiation therapy. No abnormal enhancement is identified. A few punctate foci of susceptibility artifact in the atria/occipital horns of the lateral ventricles are new and compatible with intraventricular blood products. More prominent susceptibility artifact as well as a 1  cm nodular focus of T2 hyperintensity are present posteriorly in the third ventricle, also favored to reflect blood products. Additional susceptibility compatible with blood products is present in the cerebral aqueduct and fourth ventricle. There is also evidence of small volume subarachnoid hemorrhage along the multiple superior cerebellar folia. Vascular: Major intracranial vascular flow voids are preserved, with the right vertebral artery being dominant. Skull and upper cervical spine: 9 mm left frontal skull lesion as described on CT. Sinuses/Orbits: Unremarkable orbits. Clear sinuses. Large left and trace right mastoid effusions. Other: None. IMPRESSION: 1. New, relatively small volume blood products in the lateral, third, and fourth ventricles with small volume posterior fossa subarachnoid hemorrhage. This is most notable at the level of the posterior third ventricle and proximal aqueduct resulting in mild obstructive hydrocephalus. 2. Small volume restricted diffusion in the splenium of the corpus callosum. This finding is often transient and can be seen in association with subarachnoid hemorrhage, seizure activity, drug toxicity (including chemotherapy agents), infection, and metabolic disturbances. 3. Increased cerebral white matter T2 signal abnormality compatible  with acute transependymal CSF flow superimposed on chronic radiation changes. 4. No enhancing lesions to clearly indicate intracranial metastatic disease. Critical Value/emergent results were called by telephone at the time of interpretation on 09/22/2016 at 1:28 pm to Dr. Joette Catching , who verbally acknowledged these results. Electronically Signed   By: Logan Bores M.D.   On: 09/22/2016 13:31   Mr Jeri Cos DJ Contrast  Result Date: 09/18/2016 CLINICAL DATA:  Unsteady gait. Confusion. Personal history of metastatic breast cancer. Abnormal head CT. EXAM: MRI HEAD WITHOUT AND WITH CONTRAST TECHNIQUE: Multiplanar, multiecho pulse sequences of the brain and surrounding structures were obtained without and with intravenous contrast. CONTRAST:  20 cc MultiHance COMPARISON:  CT 09/17/2016. MRI 05/19/2016. Multiple older studies as distant as 09/25/2014 FINDINGS: Brain: Diffusion imaging does not show any acute or subacute infarction. No abnormality is seen affecting the brainstem or cerebellum. Cerebral hemispheres show chronic small-vessel ischemic changes of the white matter, related to previous radiation. No sign of mass lesion, hemorrhage, hydrocephalus or extra-axial collection. Vascular: Major vessels at the base of the brain show flow. Skull and upper cervical spine: Negative. No residual or recurrent enhancing bone lesions. Sinuses/Orbits: Paranasal sinuses are clear. Orbits are negative. There are bilateral mastoid effusions, minimal on the right and quite extensive on the left. This could relate to symptoms. Other: None IMPRESSION: No evidence of acute or subacute infarction. Specifically, the cerebellum is normal. No evidence of metastatic disease. Chronic white matter changes related to previous whole-brain radiation. Mastoid effusions much more extensive on the left than the right. This could be symptomatic. These are chronic however. Electronically Signed   By: Nelson Chimes M.D.   On: 09/18/2016 09:41      ASSESSMENT: 71 y.o.  [place] woman   (1)  status post bilateral breast biopsies 07/24/2011, showing,     (a) on the right, a clinical T2 N0 papillary/ductal breast cancer, estrogen and progesterone receptor positive, HER-2 negative, with an MIB-1 of 10%;    (b) on the left, a clinical T3 N1, stage IIIA invasive ductal carcinoma, grade 3, triple positive, with an MIB-1 of 60%.  (2)  Status post 4 dose dense cycles of doxorubicin/ cyclophosphamide followed by 4 dose dense cycles of paclitaxel and trastuzumab completed 12/09/2011  (3) the trastuzumab was continued for a total of one year (to 11/01/2012). Final echo on 11/04/2012 showing a well preserved ejection fraction of 55-60%.  (4) s/p bilateral  mastectomies 01/21/2012 showing  (a) on the Right, an 8 mm invasive papillary carcinoma, grade 1, ypT1b ypN0  (b) on the Left, multiple microscopic foci of residual  Invasive ductal carcinoma with evidence of dermal lymphatic involvement, pyT1a/T4 pyN0  (5)  Postmastectomy radiation, completed 04/15/2012  (6) Started anastrazole 04/16/2012; normal dexa scan 05/19/2014 at the [facility] 09/11/2014: brain, bones, left effusion (7) CT angiogram 09/11/2014 shows new left pericardial effusion and new mediastinal and hilar lymphadenopathy; there were no suspicious upper abdominal findings; Cytology from the left effusion 09/11/2014 ([id number]) shows malignant cells which are HER-2 positive  (8) Whole body bone scan on 09/25/14 showed metastatic foci throughout axial and appendicular skeleton. Areas of most potential concern include disease in the thoracic spine, disease in the right acetabulum, right superior pubic ramus and possibly proximal right femur, disease in the right humeral shaft and in both femoral shafts.  (8) Started trastuzumab/pertuzumab 09/26/2014, discontinued after 11/29/2014 dose, w progression  (a) T-DM1 started 01/02/2015, repeated every 21  days  (b) echocardiogram 09/02/2016 hows an ejection fraction of 60-65 %  (c echocardiogram 09/20/2016 shows an ejection fraction in the 65-70%)   (9) Brain MRI on 09/25/14 was positive for numerous small enhancing brain mets and calvarium/bone mets   (a) whole brain radiation on 10/09/14--10/27/2014  (b) brain MRI 01/07/2015 shows a complete clinical response  (c) REPEAT BRAIN mri 03/18/2015 SHOWS NO NEW LESIONS  (d) repeat brain MRI with and without contrast 06/29/2015 continues to show no recurrent lesions in the brain  (e) brain MRI 10/12/2015 shows no evidence of recurrent brain metastases. She does have a large left mastoid effusion with trace right mastoid fluid.  (f) brain MRI 02/12/2016 stable--no evidence of active disease  (g) brain MRI 05/19/2016 stable, no evidence of active disease  (10) started zolendronate 11/29/2014, repeated every 12 weeks  11)  left pleural effusion re-tapped 12/08/2014 (a) left chest tube placement 12/14/2014, removedd 12/21/2014  (12) large pericardial effusion per echo 12/13/2014  (a) pericardial window placement 12/14/2014; fluid is hemorrhagic; path negative  (13) Right-sided pulmonary emboli noted on CT scan 12/13/2014-- never anticoagulated  (a) no pulmonary emboli demonstrated on non-dedicated chest CT 04/13/2015  (b) lateral lower extremity Doppler 09/18/2016 negative  (c) left upper extremity and right subclavian vein Doppler studies 09/18/2016 negative  (d) CT angiogram of the chest 09/17/2016 shows acute pulmonary embolus bilateral lower lobes with right heart strain  PLAN: Approximately I spent approximately 45 minutes with Linda [name] and her family today going over her very complicated situation.  As far as her breast cancer is concerned we just had a brain MRI which showed no evidence of recurrence there. It did show some blood. We had considered placing a shunt to prevent hydrocephalus but things didn't improve and now  no shunt is planned.   CT of the abdomen and pelvis showed her prior bone lesions but no other evidence of visceral disease.  Accordingly her breast cancer is currently optimally controlled. We are continuing the T-DM 1 every 21 days. We just obtained a repeat echocardiogram within the last 2 weeks which showed an excellent ejection fraction. We are also continuing the zolendronate every 3 months and she has a dose due today.  We then reviewed the clotting issue. 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 Normally of course this would be full anticoagulation. The problem is that she is high fall risk. He fell 3827 times before Easter, had  that bad fall in Easter where she hit her head on buttocks, and then fell again at home yesterday. I am concerned that if we fully anticoagulated and she hits her head we are going to be in a very difficult situation.  Balancing the risks of bleeding with the risk of clotting, after much discussion we decided is to do low-dose anticoagulation. Right now she is going to be on rivaroxaban at 15 mg daily. After that we will consider Lovenox 40 mg daily versus continuing rivaroxaban at the same dose.  In the meantime we have discussed fall prevention strategies. She is going to use her walker instead of her cane for the next 2 months. If there are no falls at all for 2 months she can consider the cane. Of course she should not drive. We are going to try to get her a wheelchair so that when she is outside of the home especially they're going long distances on uneven pavement, she can be "rolled" which will be more convenient for them but also safer.  We also reviewed advanced directives today. She has a DO NOT RESUSCITATE in place. She has named her daughter Roberto Scales as healthcare part of attorney  Linda [name] will see me again in 11/20/2016. They know to call for any problems that may develop before that visit.    Chauncey Cruel, MD

## 2016-10-09 NOTE — Telephone Encounter (Signed)
Per [facility] pt will need to pay $500 for Lovenox.  Per Dr Garnette Gunner ordered instead.  Per [facility] pt should be eligible for a free 30 supply.  Script called in and hard copy given to patient explaining the above information.  In basket sent to [facility] with update.

## 2016-10-09 NOTE — Patient Instructions (Addendum)
[facility] Discharge Instructions for Patients Receiving Chemotherapy  Today you received the following chemotherapy agents:  Kadcyla & Zometa To help prevent nausea and vomiting after your treatment, we encourage you to take your nausea medication as prescribed.   If you develop nausea and vomiting that is not controlled by your nausea medication, call the clinic.   BELOW ARE SYMPTOMS THAT SHOULD BE REPORTED IMMEDIATELY:  *FEVER GREATER THAN 100.5 F  *CHILLS WITH OR WITHOUT FEVER  NAUSEA AND VOMITING THAT IS NOT CONTROLLED WITH YOUR NAUSEA MEDICATION  *UNUSUAL SHORTNESS OF BREATH  *UNUSUAL BRUISING OR BLEEDING  TENDERNESS IN MOUTH AND THROAT WITH OR WITHOUT PRESENCE OF ULCERS  *URINARY PROBLEMS  *BOWEL PROBLEMS  UNUSUAL RASH Items with * indicate a potential emergency and should be followed up as soon as possible.  Feel free to call the clinic you have any questions or concerns. The clinic phone number is [phone].  Please show the [place] at check-in to the Emergency Department and triage nurse.  Zoledronic Acid injection (Hypercalcemia, Oncology) What is this medicine? ZOLEDRONIC ACID (ZOE le dron ik AS id) lowers the amount of calcium loss from bone. It is used to treat too much calcium in your blood from cancer. It is also used to prevent complications of cancer that has spread to the bone. This medicine may be used for other purposes; ask your health care provider or pharmacist if you have questions. COMMON BRAND NAME(S): Zometa What should I tell my health care provider before I take this medicine? They need to know if you have any of these conditions: -aspirin-sensitive asthma -cancer, especially if you are receiving medicines used to treat cancer -dental disease or wear dentures -infection -kidney disease -receiving corticosteroids like dexamethasone or prednisone -an unusual or allergic reaction to zoledronic acid, other medicines,  foods, dyes, or preservatives -pregnant or trying to get pregnant -breast-feeding How should I use this medicine? This medicine is for infusion into a vein. It is given by a health care professional in a hospital or clinic setting. Talk to your pediatrician regarding the use of this medicine in children. Special care may be needed. Overdosage: If you think you have taken too much of this medicine contact a poison control center or emergency room at once. NOTE: This medicine is only for you. Do not share this medicine with others. What if I miss a dose? It is important not to miss your dose. Call your doctor or health care professional if you are unable to keep an appointment. What may interact with this medicine? -certain antibiotics given by injection -NSAIDs, medicines for pain and inflammation, like ibuprofen or naproxen -some diuretics like bumetanide, furosemide -teriparatide -thalidomide This list may not describe all possible interactions. Give your health care provider a list of all the medicines, herbs, non-prescription drugs, or dietary supplements you use. Also tell them if you smoke, drink alcohol, or use illegal drugs. Some items may interact with your medicine. What should I watch for while using this medicine? Visit your doctor or health care professional for regular checkups. It may be some time before you see the benefit from this medicine. Do not stop taking your medicine unless your doctor tells you to. Your doctor may order blood tests or other tests to see how you are doing. Women should inform their doctor if they wish to become pregnant or think they might be pregnant. There is a potential for serious side effects to an unborn child. Talk  to your health care professional or pharmacist for more information. You should make sure that you get enough calcium and vitamin D while you are taking this medicine. Discuss the foods you eat and the vitamins you take with your health  care professional. Some people who take this medicine have severe bone, joint, and/or muscle pain. This medicine may also increase your risk for jaw problems or a broken thigh bone. Tell your doctor right away if you have severe pain in your jaw, bones, joints, or muscles. Tell your doctor if you have any pain that does not go away or that gets worse. Tell your dentist and dental surgeon that you are taking this medicine. You should not have major dental surgery while on this medicine. See your dentist to have a dental exam and fix any dental problems before starting this medicine. Take good care of your teeth while on this medicine. Make sure you see your dentist for regular follow-up appointments. What side effects may I notice from receiving this medicine? Side effects that you should report to your doctor or health care professional as soon as possible: -allergic reactions like skin rash, itching or hives, swelling of the face, lips, or tongue -anxiety, confusion, or depression -breathing problems -changes in vision -eye pain -feeling faint or lightheaded, falls -jaw pain, especially after dental work -mouth sores -muscle cramps, stiffness, or weakness -redness, blistering, peeling or loosening of the skin, including inside the mouth -trouble passing urine or change in the amount of urine Side effects that usually do not require medical attention (report to your doctor or health care professional if they continue or are bothersome): -bone, joint, or muscle pain -constipation -diarrhea -fever -hair loss -irritation at site where injected -loss of appetite -nausea, vomiting -stomach upset -trouble sleeping -trouble swallowing -weak or tired This list may not describe all possible side effects. Call your doctor for medical advice about side effects. You may report side effects to FDA at 1-800-FDA-1088. Where should I keep my medicine? This drug is given in a hospital or clinic and  will not be stored at home. NOTE: This sheet is a summary. It may not cover all possible information. If you have questions about this medicine, talk to your doctor, pharmacist, or health care provider.  2018 Elsevier/Gold Standard (2013-10-29 14:19:39)

## 2016-10-10 ENCOUNTER — Telehealth: Payer: Self-pay

## 2016-10-10 NOTE — Telephone Encounter (Signed)
Verbal order given to [facility] for [facility] OT per their request.

## 2016-10-15 ENCOUNTER — Telehealth: Payer: Self-pay

## 2016-10-15 NOTE — Telephone Encounter (Signed)
Spoke with Suezanne Jacquet, PT from [facility] who states pt told him she had a fall "a couple of days ago".  Per Suezanne Jacquet, pt did not sustain any injuries.  OT has been ordered for pt and her wheelchair is to be delivered on 5/3 per [facility] [place].  LVM with pt and her son to follow up.

## 2016-10-30 ENCOUNTER — Ambulatory Visit (HOSPITAL_BASED_OUTPATIENT_CLINIC_OR_DEPARTMENT_OTHER): Payer: Medicare Other

## 2016-10-30 ENCOUNTER — Other Ambulatory Visit (HOSPITAL_BASED_OUTPATIENT_CLINIC_OR_DEPARTMENT_OTHER): Payer: Medicare Other

## 2016-10-30 VITALS — BP 118/49 | HR 83 | Temp 98.6°F | Resp 17

## 2016-10-30 DIAGNOSIS — Z5112 Encounter for antineoplastic immunotherapy: Secondary | ICD-10-CM | POA: Diagnosis present

## 2016-10-30 DIAGNOSIS — C7931 Secondary malignant neoplasm of brain: Secondary | ICD-10-CM

## 2016-10-30 DIAGNOSIS — C50919 Malignant neoplasm of unspecified site of unspecified female breast: Secondary | ICD-10-CM

## 2016-10-30 DIAGNOSIS — C50911 Malignant neoplasm of unspecified site of right female breast: Secondary | ICD-10-CM

## 2016-10-30 DIAGNOSIS — C50811 Malignant neoplasm of overlapping sites of right female breast: Secondary | ICD-10-CM

## 2016-10-30 DIAGNOSIS — J910 Malignant pleural effusion: Secondary | ICD-10-CM

## 2016-10-30 DIAGNOSIS — C50912 Malignant neoplasm of unspecified site of left female breast: Principal | ICD-10-CM

## 2016-10-30 DIAGNOSIS — C50812 Malignant neoplasm of overlapping sites of left female breast: Secondary | ICD-10-CM

## 2016-10-30 DIAGNOSIS — C7800 Secondary malignant neoplasm of unspecified lung: Secondary | ICD-10-CM

## 2016-10-30 LAB — CBC WITH DIFFERENTIAL/PLATELET
BASO%: 0.5 % (ref 0.0–2.0)
BASOS ABS: 0 10*3/uL (ref 0.0–0.1)
EOS%: 1.5 % (ref 0.0–7.0)
Eosinophils Absolute: 0.1 10*3/uL (ref 0.0–0.5)
HCT: 27.3 % — ABNORMAL LOW (ref 34.8–46.6)
HGB: 9.1 g/dL — ABNORMAL LOW (ref 11.6–15.9)
LYMPH%: 24.7 % (ref 14.0–49.7)
MCH: 33.1 pg (ref 25.1–34.0)
MCHC: 33.3 g/dL (ref 31.5–36.0)
MCV: 99.4 fL (ref 79.5–101.0)
MONO#: 0.7 10*3/uL (ref 0.1–0.9)
MONO%: 13 % (ref 0.0–14.0)
NEUT#: 3.2 10*3/uL (ref 1.5–6.5)
NEUT%: 60.3 % (ref 38.4–76.8)
PLATELETS: 208 10*3/uL (ref 145–400)
RBC: 2.75 10*6/uL — AB (ref 3.70–5.45)
RDW: 19.9 % — ABNORMAL HIGH (ref 11.2–14.5)
WBC: 5.2 10*3/uL (ref 3.9–10.3)
lymph#: 1.3 10*3/uL (ref 0.9–3.3)

## 2016-10-30 LAB — COMPREHENSIVE METABOLIC PANEL
ALT: 29 U/L (ref 0–55)
ANION GAP: 7 meq/L (ref 3–11)
AST: 41 U/L — ABNORMAL HIGH (ref 5–34)
Albumin: 3.1 g/dL — ABNORMAL LOW (ref 3.5–5.0)
Alkaline Phosphatase: 112 U/L (ref 40–150)
BUN: 16.6 mg/dL (ref 7.0–26.0)
CHLORIDE: 106 meq/L (ref 98–109)
CO2: 30 meq/L — AB (ref 22–29)
Calcium: 9.4 mg/dL (ref 8.4–10.4)
Creatinine: 0.8 mg/dL (ref 0.6–1.1)
EGFR: 78 mL/min/{1.73_m2} — AB (ref >90)
GLUCOSE: 135 mg/dL (ref 70–140)
POTASSIUM: 3.8 meq/L (ref 3.5–5.1)
SODIUM: 143 meq/L (ref 136–145)
Total Bilirubin: 0.83 mg/dL (ref 0.20–1.20)
Total Protein: 7.5 g/dL (ref 6.4–8.3)

## 2016-10-30 MED ORDER — SODIUM CHLORIDE 0.9 % IJ SOLN
10.0000 mL | INTRAMUSCULAR | Status: DC | PRN
  Administered 2016-10-30: 10 mL
  Filled 2016-10-30: qty 10

## 2016-10-30 MED ORDER — DIPHENHYDRAMINE HCL 25 MG PO CAPS
25.0000 mg | ORAL_CAPSULE | Freq: Once | ORAL | Status: AC
  Administered 2016-10-30: 25 mg via ORAL

## 2016-10-30 MED ORDER — SODIUM CHLORIDE 0.9 % IV SOLN
Freq: Once | INTRAVENOUS | Status: AC
  Administered 2016-10-30: 14:00:00 via INTRAVENOUS

## 2016-10-30 MED ORDER — DIPHENHYDRAMINE HCL 25 MG PO CAPS
ORAL_CAPSULE | ORAL | Status: AC
  Filled 2016-10-30: qty 1

## 2016-10-30 MED ORDER — ACETAMINOPHEN 325 MG PO TABS
650.0000 mg | ORAL_TABLET | Freq: Once | ORAL | Status: AC
  Administered 2016-10-30: 650 mg via ORAL

## 2016-10-30 MED ORDER — ACETAMINOPHEN 325 MG PO TABS
ORAL_TABLET | ORAL | Status: AC
  Filled 2016-10-30: qty 2

## 2016-10-30 MED ORDER — HEPARIN SOD (PORK) LOCK FLUSH 100 UNIT/ML IV SOLN
500.0000 [IU] | Freq: Once | INTRAVENOUS | Status: AC | PRN
  Administered 2016-10-30: 500 [IU]
  Filled 2016-10-30: qty 5

## 2016-10-30 MED ORDER — ADO-TRASTUZUMAB EMTANSINE CHEMO INJECTION 160 MG
3.5000 mg/kg | Freq: Once | INTRAVENOUS | Status: AC
  Administered 2016-10-30: 360 mg via INTRAVENOUS
  Filled 2016-10-30: qty 10

## 2016-10-30 NOTE — Patient Instructions (Signed)
[facility] Discharge Instructions for Patients Receiving Chemotherapy  Today you received the following chemotherapy agents:  Kadcyla  To help prevent nausea and vomiting after your treatment, we encourage you to take your nausea medication as prescribed.   If you develop nausea and vomiting that is not controlled by your nausea medication, call the clinic.   BELOW ARE SYMPTOMS THAT SHOULD BE REPORTED IMMEDIATELY:  *FEVER GREATER THAN 100.5 F  *CHILLS WITH OR WITHOUT FEVER  NAUSEA AND VOMITING THAT IS NOT CONTROLLED WITH YOUR NAUSEA MEDICATION  *UNUSUAL SHORTNESS OF BREATH  *UNUSUAL BRUISING OR BLEEDING  TENDERNESS IN MOUTH AND THROAT WITH OR WITHOUT PRESENCE OF ULCERS  *URINARY PROBLEMS  *BOWEL PROBLEMS  UNUSUAL RASH Items with * indicate a potential emergency and should be followed up as soon as possible.  Feel free to call the clinic you have any questions or concerns. The clinic phone number is [phone].  Please show the CHEMO ALERT CARD at check-in to the Emergency Department and triage nurse.   

## 2016-10-31 MED FILL — FUROSEMIDE 20 MG TABLET: 20 | 30 days supply | Qty: 30 | Fill #1

## 2016-10-31 MED FILL — SPIRONOLACTONE 25 MG TABLET: 25 | 30 days supply | Qty: 15 | Fill #2

## 2016-11-05 ENCOUNTER — Other Ambulatory Visit: Payer: Self-pay | Admitting: *Deleted

## 2016-11-05 ENCOUNTER — Telehealth: Payer: Self-pay

## 2016-11-05 MED ORDER — RIVAROXABAN 20 MG PO TABS: 20.0000 mg | ORAL_TABLET | Freq: Every day | ORAL | 3 refills | Status: DC

## 2016-11-05 NOTE — Telephone Encounter (Signed)
Pt had Rx for Xarelto starter pack. She is calling for refill on xarelto.

## 2016-11-05 NOTE — Telephone Encounter (Signed)
Called pt Linda [name] to let her know that her xarelto was refilled at [facility] and to start taking 20mg  with food daily.call back number provided for additional questions.

## 2016-11-06 ENCOUNTER — Telehealth: Payer: Self-pay | Admitting: *Deleted

## 2016-11-06 NOTE — Telephone Encounter (Signed)
This RN received communication per [facility] that pt contacted them pm 5/23 stating prescription for xarelto is too expensive. Per message she states she has 4 tablets left of current prescription.  This RN contacted the [place] and was informed pt's cost if $463 - but noted pt has a $ 405 deductible regardless of what blood thinner is used.  Once pt meets the deductible her co pay will be much less.  This RN attempted to reach pt to discuss and obtained VM - message left to return call to discuss concerns further.

## 2016-11-07 ENCOUNTER — Telehealth: Payer: Self-pay | Admitting: *Deleted

## 2016-11-07 NOTE — Telephone Encounter (Signed)
VM received from [place] left by the patient requesting a return call due to cost concerns of Xarelto.  Return call number left as [phone].  This RN returned call and obtained number identified VM - message left informing pt of discussion with pharmacy that pt has a deductible she has to meet of $ 405 - further prescriptions will then be much less expensive.  This RN request pt to return call and attempt to speak to a nurse directly - either this RN or a TRIAGE nurse.

## 2016-11-11 ENCOUNTER — Telehealth: Payer: Self-pay | Admitting: *Deleted

## 2016-11-11 NOTE — Telephone Encounter (Signed)
VM received from [place] - note VM left yesterday by patient- with pt stating she was expecting a prescription for a blood thinner to be called into her pharmacy- " but none has except the xarelto which is too expensive "  This RN reviewed pt's prior calls and request for less expensive blood thinner - with MD suggesting coumadin which will require lab visits.  This RN returned call to the patient and obtained identified VM- detailed message left per above with request for a return call to verify which pharmacy medication should be called to as well as her understanding of need for lab appointments.  If she decides to use of coumadin - pt will need a lab and see RN this Friday 11/14/2016.

## 2016-11-11 NOTE — Telephone Encounter (Signed)
"  Calling to notify Dr. Jana Hakim I've awakened the past two mornings with nose bleeds.  I'm on Xarelto.  Please call me at [phone]."  Called receiving voicemail.  Message left requesting return call.

## 2016-11-11 NOTE — Telephone Encounter (Signed)
This RN has attempted to reach pt multiple times today- only able to leave messages.  VM also received from Physical Therapist Ru Tu ( ? [place] ) stating pt has developed nose bleeds post 2 mornings. Return call number given as [phone].  Call returned to above number and obtained VM. Message left with this RN's direct number.  Note concerns reviewed with MD - including that presently pt is off xarelto due to she has not been able to pick up prescription per cost concern.  Due to noted nose bleeds - MD is requesting for pt to hold the xarelto for 2 days then to start the coumadin at 5mg  daily with lab check approximately 4 days later.  This RN will await return call.

## 2016-11-12 ENCOUNTER — Telehealth: Payer: Self-pay | Admitting: *Deleted

## 2016-11-12 NOTE — Telephone Encounter (Signed)
This RN attempted to contact pt to discuss prescription for coumadin requiring lab follow up and again reached VM.  Message left requesting a return call.  Due to concern of need for blood thinner due to history of PE and inability to speak directly with the patient this RN contacted the the patient's [place].  Discussed above concerns - she stated she would get in contact with pt " she usually responds to my call " - and communicate need for lab follow up lab appointments for coumadin to be ordered.  Beka stated " if you do not hear from by tomorrow please call and let me know ".

## 2016-11-14 ENCOUNTER — Telehealth: Payer: Self-pay | Admitting: *Deleted

## 2016-11-14 MED ORDER — WARFARIN SODIUM 5 MG PO TABS: 5.0000 mg | ORAL_TABLET | Freq: Every day | ORAL | 0 refills | Status: AC

## 2016-11-14 MED FILL — WARFARIN SODIUM 5 MG TABLET: 5 | 30 days supply | Qty: 30 | Fill #0

## 2016-11-14 NOTE — Telephone Encounter (Signed)
This RN attempted to reach pt this AM per need for medication for known history PE- again reached VM - message left requesting a return call due to concerns for medication for known serious condition.  This RN then contacted noted [facility] on file- Linda [name] and obtained identfied VM - message left per above and concern for pt's known medical condition and need for contact to discuss care.  Linda returned call to this RN and discussed her contact with the patient on 5/30- " Linda [name] seemed to think she was not supposed to be on a blood thinner because of the nose bleeds and home health told her she was not to take anything" " she said she has an appointment next week and would wait until then "  This RN informed Linda if nose bleeds have resolved pt needs to start a blood thinner  Plan is - coumadin will be sent to verified pharmacy [facility] ) and her son Linda [name] will pick it up today.  Labs will be checked and further appointments for lab follow up will be made per visit next week.  This RN did contact [facility] and discontinued prescription for xarelto - coumadin only anti coag now on file.

## 2016-11-19 NOTE — Progress Notes (Signed)
ID: Linda [name]   DOB: April 08, 1946  MR#: [id number]  OZH#:[id number]  PCP: Patient, No Pcp Per GYN: SU: Coralie Keens OTHER MD: Crissie Reese, Linna Hoff Bensimhon  CHIEF COMPLAINT:  Bilateral Breast Cancers  CURRENT TREATMENT: Anastrozole, T-DM 1, zolendrnate, rivaroxaban  BREAST CANCER HISTORY: From the original intake note:  The patient noted a small amount of drainage from her left breast December of 2012. She brought it to her gynecologist's attention in January of 2013 and was set up for bilateral diagnostic mammography at the [facility] Center July 24, 2011. This was the patient's first ever mammogram. It showed a large irregular mass in the left retroareolar region extending to the nipple, with nipple retraction and skin thickening. This measured approximately 8.4 cm. It was associated with pleomorphic microcalcifications. By exam there was moderate distortion and retraction of the nipple with a large palpable ill-defined area in the retroareolar region. In the right right breast there was also a 2 cm hard mass palpated. Ultrasound of the right breast mass showed a complex cystic/solid area measuring 1.9 cm. Ultrasound of the right axilla was negative. Ultrasound of the left breast showed a large hypoechoic mass measuring at least 3.8 cm. The left axilla showed several adjacent abnormal appearing lymph nodes.  With this information biopsy of the right and left breast masses were obtained the same day, and showed ([id number])   (a) on the right, and invasive ductal carcinoma with papillary features, estrogen and progesterone receptor positive, HER-2 negative, with an MIB-1 of 10%.   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 The MIB-1 was 60% for the left-sided tumor.   With this information the patient was presented at the multidisciplinary breast cancer conference 08/06/2011. Subsequent  evaluation and treatments are as detailed below.  INTERVAL HISTORY: Linda [name] returns today for follow-up of her HER-2/neu positive stage IV breast cancer. She continues on ado-trastuzumab emtansine every 21 days and tolerates that well.  Her most recent echocardiogram was 09/20/2016 and showed a well-preserved ejection fraction.  She was found to have recurrent urinary embolism 09/17/2016 and was started on rivaroxaban, but this caused her more than $400 a month and she simply stopped the medication (as of 11/12/2016.) We had a great deal of difficulty contacting her but she has been started on Coumadin, 5 mg daily, which she began on 11/17/2016. She has had no bleeding problems. She obtains this drug at a reasonable cost.   REVIEW OF SYSTEMS: Linda [name] is now retired and living at her son's. She gets up in the morning, and cleans her room, washes and dresses, has breakfast, and spends a good deal of the day thinking. She doesn't want to be a repeat. She does exercises partly with a home health aide--when they come she walks about half a mile she says--and she continues to try to improve her upper extremity range of motion. She denies pain, unusual headaches, visual changes, nausea or vomiting. There have been no falls. A detailed review of systems today was otherwise stable  PAST MEDICAL HISTORY: Past Medical History:  Diagnosis Date  . Acute on chronic diastolic CHF (congestive heart failure) ([place])   . Acute pulmonary embolism ([place]) 12/13/2014  . Allergy   . Blood transfusion without reported diagnosis   . Breast cancer ([place]) 07/30/11 dx   Right  invasive ductal ca 7 0'clock,& left breast=invasive ductal ca  and dcis, left axilla nlymph node, metastatic ca  .  Bronchitis    hx  . Cancer ([place]) 08-13-11   07-31-11-Dx. Bilarteral Breast cancer-left greater than rt.  . Cancer of central portion of left female breast ([place]) 08/01/2011  . Diabetes mellitus without complication ([place])   . GERD (gastroesophageal  reflux disease)    doing well  . Hematuria, undiagnosed cause 08-13-11   Being evaluated by [facility] 08-14-11  . History of radiation therapy 02/20/12-04/15/12   left breast,total 60.4 Gy  . Hypertension   . Radiation-induced dermatitis 03/26/2012   Using radioplex cream, plus neosporin.   . S/P radiation therapy 10/09/14-10/27/14   whole brain 37.5Gy/68f  . Seasonal allergies   . Seroma 02/04/12   right breast  200cc removed  erythema on right side  . Wears partial dentures    wears upper and lower partial    PAST SURGICAL HISTORY: Past Surgical History:  Procedure Laterality Date  . BREAST SURGERY    . CHEST TUBE INSERTION Left 12/14/2014   Procedure: CHEST TUBE INSERTION;  Surgeon: CRexene Alberts MD;  [facility]  Service: Thoracic;  Laterality: Left;  . child birth  08-13-11   x3 -NVD  . MASTECTOMY W/ SENTINEL NODE BIOPSY  01/21/2012   Procedure: MASTECTOMY WITH SENTINEL LYMPH NODE BIOPSY;  Surgeon: DHarl Bowie MD;  [facility]  Service: General;  Laterality: Bilateral;  Left modified radical mastectomy, Rt mastectomy with Sentinel lymphnode biospy  . PORT-A-CATH REMOVAL Right 12/22/2012   Procedure: REMOVAL PORT-A-CATH;  Surgeon: DHarl Bowie MD;  [facility]  Service: General;  Laterality: Right;  . PORTACATH PLACEMENT  08/15/2011   Procedure: INSERTION PORT-A-CATH;  Surgeon: DHarl Bowie MD;  [facility];  Service: General;  Laterality: N/A;  . SUBXYPHOID PERICARDIAL WINDOW N/A 12/14/2014   Procedure: SUBXYPHOID PERICARDIAL WINDOW;  Surgeon: CRexene Alberts MD;  [facility]  Service: Thoracic;  Laterality: N/A;  . TEE WITHOUT CARDIOVERSION N/A 12/14/2014   Procedure: TRANSESOPHAGEAL ECHOCARDIOGRAM (TEE);  Surgeon: CRexene Alberts MD;  [facility];  Service: Thoracic;  Laterality: N/A;    FAMILY HISTORY Family History  Problem Relation Age of Onset  . Heart disease Mother   . Cancer  Mother 423      breast, , [id number]  . Heart attack Father   . Cancer Sister [phone]      breast  . Colon cancer Neg Hx   The patient's father died from a heart attack at the age of 866 The patient's mother died from apparently heart problems at the age of 885 The patient had no brothers. She has 3 sisters. One of her sisters was diagnosed with breast cancer in her mid 675s The patient does not know if his sister was ever genetically tested. The patient's mother had a mastectomy at the age of 344 presumably for breast cancer. There is no other history of breast or in cancer in the family to her knowledge.   GYNECOLOGIC HISTORY:  She does not recall when she had menarche. She had her first child at age 89 She is GX P3. She underwent menopause in her mid 464s She never took hormone replacement.   SOCIAL HISTORY:  She works seasonally for [employer] but is planning to retire as of April 1720. 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 She moved in with her son CGerald StabsSept 2017--he lives in  [place] and works as a Airline pilot. His wife is a Marine scientist.there is a 90-monthold child in the house (as of March 2018) Son [facility]Shanon [facility] at [facility] is a dAdvertising account executivein addition to having the m[employer] The patient attends a local [employer]here   ADVANCED DIRECTIVES: In place. The patient has opted for a DO NOT RESUSCITATE order. Her daughter BRoberto Scalesis her healthcare power of attorney. Neck affect can be reached at [phone]  HEALTH MAINTENANCE: Social History  Substance Use Topics  . Smoking status: Never Smoker  . Smokeless tobacco: Never Used  . Alcohol use 0.0 oz/week     Comment: rare- occ.     Colonoscopy: Never  PAP: Feb 2013  Bone density: November 2013, normal  Lipid panel:   No Known Allergies  Current Outpatient Prescriptions  Medication Sig Dispense Refill  . anastrozole  (ARIMIDEX) 1 MG tablet Take 1 tablet (1 mg total) by mouth daily. 90 tablet 3  . b complex vitamins tablet Take 1 tablet by mouth daily.    . cholecalciferol (VITAMIN D) 1000 UNITS tablet Take 1 tablet (1,000 Units total) by mouth daily. 100 tablet 12  . furosemide (LASIX) 20 MG tablet Take 1 tablet (20 mg total) by mouth daily as needed. (Patient taking differently: Take 20 mg by mouth daily as needed for fluid. ) 30 tablet 12  . Multiple Vitamins-Iron (MULTIVITAMINS WITH IRON) TABS Take 1 tablet by mouth daily.    . pantoprazole (PROTONIX) 40 MG tablet Take 1 tablet (40 mg total) by mouth daily as needed. 30 tablet 0  . spironolactone (ALDACTONE) 25 MG tablet Take 0.5 tablets (12.5 mg total) by mouth daily. 15 tablet 3  . warfarin (COUMADIN) 5 MG tablet Take 1 tablet (5 mg total) by mouth daily. 30 tablet 0   No current facility-administered medications for this visit.     OBJECTIVE: [name] who appears older than stated age   V36   11/20/16 1019  BP: (!) 123/47  Pulse: 70  Resp: 17  Temp: 97.9 F (36.6 C)     Body mass index is 29.62 kg/m.    ECOG FS: 1 Filed Weights   11/20/16 1019  Weight: 200 lb 9.6 oz (91 kg)    Sclerae unicteric, EOMs intact Oropharynx clear and moist No cervical or supraclavicular adenopathy Lungs no rales or rhonchi Heart regular rate and rhythm Abd soft, nontender, positive bowel sounds MSK no focal spinal tenderness, no upper extremity lymphedema Neuro: nonfocal, well oriented, appropriate affect Breasts: Deferred   LAB RESULTS: CBC Latest Ref Rng & Units 11/20/2016 10/30/2016 10/09/2016  WBC 3.9 - 10.3 10e3/uL 4.6 5.2 4.4  Hemoglobin 11.6 - 15.9 g/dL 9.7(L) 9.1(L) 10.0(L)  Hematocrit 34.8 - 46.6 % 28.6(L) 27.3(L) 30.1(L)  Platelets 145 - 400 10e3/uL 207 208 215   CMP Latest Ref Rng & Units 11/20/2016 10/30/2016 10/09/2016  Glucose 70 - 140 mg/dl 126 135 128  BUN 7.0 - 26.0 mg/dL 20.1 16.6 16.0  Creatinine 0.6 - 1.1 mg/dL 0.7  0.8 0.7  Sodium 136 - 145 mEq/L 143 143 142  Potassium 3.5 - 5.1 mEq/L 3.5 3.8 3.6  Chloride 101 - 111 mmol/L - - -  CO2 22 - 29 mEq/L 30(H) 30(H) 26  Calcium 8.4 - 10.4 mg/dL 9.9 9.4 9.5  Total Protein 6.4 - 8.3 g/dL 8.1 7.5 7.6  Total Bilirubin 0.20 - 1.20 mg/dL 0.46 0.83 1.43(H)  Alkaline Phos 40 - 150 U/L 142 112  130  AST 5 - 34 U/L 69(H) 41(H) 40(H)  ALT 0 - 55 U/L 52 29 27    STUDIES: Most recent brain MRI, 09/22/2016 showed no evidence of recurrent metastases to the brain   ASSESSMENT: 71 y.o.  [place] woman   (1)  status post bilateral breast biopsies 07/24/2011, showing,     (a) on the right, a clinical T2 N0 papillary/ductal breast cancer, estrogen and progesterone receptor positive, HER-2 negative, with an MIB-1 of 10%;    (b) on the left, a clinical T3 N1, stage IIIA invasive ductal carcinoma, grade 3, triple positive, with an MIB-1 of 60%.  (2)  Status post 4 dose dense cycles of doxorubicin/ cyclophosphamide followed by 4 dose dense cycles of paclitaxel and trastuzumab completed 12/09/2011  (3) the trastuzumab was continued for a total of one year (to 11/01/2012). Final echo on 11/04/2012 showing a well preserved ejection fraction of 55-60%.  (4) s/p bilateral mastectomies 01/21/2012 showing  (a) on the Right, an 8 mm invasive papillary carcinoma, grade 1, ypT1b ypN0  (b) on the Left, multiple microscopic foci of residual  Invasive ductal carcinoma with evidence of dermal lymphatic involvement, pyT1a/T4 pyN0  (5)  Postmastectomy radiation, completed 04/15/2012  (6) Started anastrazole 04/16/2012; normal dexa scan 05/19/2014 at the [facility] 09/11/2014: brain, bones, left effusion (7) CT angiogram 09/11/2014 shows new left pericardial effusion and new mediastinal and hilar lymphadenopathy; there were no suspicious upper abdominal findings; Cytology from the left effusion 09/11/2014 ([id number]) shows malignant cells which are HER-2  positive  (8) Whole body bone scan on 09/25/14 showed metastatic foci throughout axial and appendicular skeleton. Areas of most potential concern include disease in the thoracic spine, disease in the right acetabulum, right superior pubic ramus and possibly proximal right femur, disease in the right humeral shaft and in both femoral shafts.  (8) Started trastuzumab/pertuzumab 09/26/2014, discontinued after 11/29/2014 dose, w progression  (a) T-DM1 started 01/02/2015, repeated every 21 days  (b) echocardiogram 09/02/2016 hows an ejection fraction of 60-65 %  (c echocardiogram 09/20/2016 shows an ejection fraction in the 65-70%)   (9) Brain MRI on 09/25/14 was positive for numerous small enhancing brain mets and calvarium/bone mets   (a) whole brain radiation on 10/09/14--10/27/2014  (b) brain MRI 01/07/2015 shows a complete clinical response  (c) REPEAT BRAIN mri 03/18/2015 SHOWS NO NEW LESIONS  (d) repeat brain MRI with and without contrast 06/29/2015 continues to show no recurrent lesions in the brain  (e) brain MRI 10/12/2015 shows no evidence of recurrent brain metastases. She does have a large left mastoid effusion with trace right mastoid fluid.  (f) brain MRI 02/12/2016 stable--no evidence of active disease  (g) brain MRI 05/19/2016 stable, no evidence of active disease  (10) started zolendronate 11/29/2014, repeated every 12 weeks  11)  left pleural effusion re-tapped 12/08/2014 (a) left chest tube placement 12/14/2014, removedd 12/21/2014  (12) large pericardial effusion per echo 12/13/2014  (a) pericardial window placement 12/14/2014; fluid is hemorrhagic; path negative  (13) Right-sided pulmonary emboli noted on CT scan 12/13/2014-- never anticoagulated  (a) no pulmonary emboli demonstrated on non-dedicated chest CT 04/13/2015  (b) lateral lower extremity Doppler 09/18/2016 negative  (c) left upper extremity and right subclavian vein Doppler studies  09/18/2016 negative  (d) CT angiogram of the chest 09/17/2016 shows acute pulmonary embolus bilateral lower lobes with right heart strain   (i) on rivaroxaban until 11/12/2016   (ii) started Coumadin 11/17/2016  PLAN: Linda [name] is doing well  as far as her metastatic breast cancer is concerned. I think she is showing some signs of mental decline related to her radiation treatments but she herself does not seem to be aware of this. It is not so much a confusion as a lack of initiative.  There may be an element of depression although this is not apparent in her interactions with me. Certainly we can consider adding a mild antidepressant at the next visit  Her INR today is in the upper therapeutic range. Instead of adjusting it at this point I would rather repeated in the next few weeks to see how this varies with her diet. She will let us know if any bleeding develops.  I feel comfortable extending her Kadcyla treatments to every 4 weeks. She will see me again in August and before that visit she will have a repeat brain MRI.  She knows to call for any problems that may develop before that visit.     MAGRINAT,GUSTAV C 11/20/2016

## 2016-11-20 ENCOUNTER — Other Ambulatory Visit (HOSPITAL_BASED_OUTPATIENT_CLINIC_OR_DEPARTMENT_OTHER): Payer: Medicare Other

## 2016-11-20 ENCOUNTER — Ambulatory Visit (HOSPITAL_BASED_OUTPATIENT_CLINIC_OR_DEPARTMENT_OTHER): Payer: Medicare Other | Admitting: Oncology

## 2016-11-20 ENCOUNTER — Ambulatory Visit: Payer: Medicare Other

## 2016-11-20 ENCOUNTER — Ambulatory Visit (HOSPITAL_BASED_OUTPATIENT_CLINIC_OR_DEPARTMENT_OTHER): Payer: Medicare Other

## 2016-11-20 VITALS — BP 133/59 | HR 68 | Temp 98.3°F | Resp 17

## 2016-11-20 VITALS — BP 123/47 | HR 70 | Temp 97.9°F | Resp 17 | Ht 69.0 in | Wt 200.6 lb

## 2016-11-20 DIAGNOSIS — I2699 Other pulmonary embolism without acute cor pulmonale: Secondary | ICD-10-CM | POA: Diagnosis not present

## 2016-11-20 DIAGNOSIS — I519 Heart disease, unspecified: Secondary | ICD-10-CM

## 2016-11-20 DIAGNOSIS — C50912 Malignant neoplasm of unspecified site of left female breast: Secondary | ICD-10-CM

## 2016-11-20 DIAGNOSIS — C50911 Malignant neoplasm of unspecified site of right female breast: Secondary | ICD-10-CM

## 2016-11-20 DIAGNOSIS — C7951 Secondary malignant neoplasm of bone: Secondary | ICD-10-CM

## 2016-11-20 DIAGNOSIS — C7802 Secondary malignant neoplasm of left lung: Principal | ICD-10-CM

## 2016-11-20 DIAGNOSIS — C50811 Malignant neoplasm of overlapping sites of right female breast: Secondary | ICD-10-CM

## 2016-11-20 DIAGNOSIS — J910 Malignant pleural effusion: Secondary | ICD-10-CM

## 2016-11-20 DIAGNOSIS — C7931 Secondary malignant neoplasm of brain: Secondary | ICD-10-CM

## 2016-11-20 DIAGNOSIS — Z170 Estrogen receptor positive status [ER+]: Secondary | ICD-10-CM | POA: Diagnosis not present

## 2016-11-20 DIAGNOSIS — C50812 Malignant neoplasm of overlapping sites of left female breast: Secondary | ICD-10-CM

## 2016-11-20 DIAGNOSIS — Z7901 Long term (current) use of anticoagulants: Secondary | ICD-10-CM | POA: Diagnosis not present

## 2016-11-20 DIAGNOSIS — C7800 Secondary malignant neoplasm of unspecified lung: Secondary | ICD-10-CM

## 2016-11-20 DIAGNOSIS — Z5112 Encounter for antineoplastic immunotherapy: Secondary | ICD-10-CM

## 2016-11-20 DIAGNOSIS — C50919 Malignant neoplasm of unspecified site of unspecified female breast: Secondary | ICD-10-CM

## 2016-11-20 LAB — COMPREHENSIVE METABOLIC PANEL
ALBUMIN: 3.2 g/dL — AB (ref 3.5–5.0)
ALK PHOS: 142 U/L (ref 40–150)
ALT: 52 U/L (ref 0–55)
ANION GAP: 9 meq/L (ref 3–11)
AST: 69 U/L — ABNORMAL HIGH (ref 5–34)
BILIRUBIN TOTAL: 0.46 mg/dL (ref 0.20–1.20)
BUN: 20.1 mg/dL (ref 7.0–26.0)
CO2: 30 mEq/L — ABNORMAL HIGH (ref 22–29)
Calcium: 9.9 mg/dL (ref 8.4–10.4)
Chloride: 105 mEq/L (ref 98–109)
Creatinine: 0.7 mg/dL (ref 0.6–1.1)
EGFR: 83 mL/min/{1.73_m2} — AB (ref >90)
GLUCOSE: 126 mg/dL (ref 70–140)
Potassium: 3.5 mEq/L (ref 3.5–5.1)
SODIUM: 143 meq/L (ref 136–145)
TOTAL PROTEIN: 8.1 g/dL (ref 6.4–8.3)

## 2016-11-20 LAB — CBC WITH DIFFERENTIAL/PLATELET
BASO%: 0.6 % (ref 0.0–2.0)
Basophils Absolute: 0 10*3/uL (ref 0.0–0.1)
EOS ABS: 0.1 10*3/uL (ref 0.0–0.5)
EOS%: 3.1 % (ref 0.0–7.0)
HEMATOCRIT: 28.6 % — AB (ref 34.8–46.6)
HEMOGLOBIN: 9.7 g/dL — AB (ref 11.6–15.9)
LYMPH#: 1.3 10*3/uL (ref 0.9–3.3)
LYMPH%: 29 % (ref 14.0–49.7)
MCH: 33.1 pg (ref 25.1–34.0)
MCHC: 33.8 g/dL (ref 31.5–36.0)
MCV: 98.1 fL (ref 79.5–101.0)
MONO#: 0.6 10*3/uL (ref 0.1–0.9)
MONO%: 13.4 % (ref 0.0–14.0)
NEUT%: 53.9 % (ref 38.4–76.8)
NEUTROS ABS: 2.5 10*3/uL (ref 1.5–6.5)
Platelets: 207 10*3/uL (ref 145–400)
RBC: 2.92 10*6/uL — ABNORMAL LOW (ref 3.70–5.45)
RDW: 18.5 % — ABNORMAL HIGH (ref 11.2–14.5)
WBC: 4.6 10*3/uL (ref 3.9–10.3)

## 2016-11-20 LAB — PROTIME-INR
INR: 3.4 (ref 2.00–3.50)
Protime: 40.8 Seconds — ABNORMAL HIGH (ref 10.6–13.4)

## 2016-11-20 MED ORDER — SODIUM CHLORIDE 0.9 % IJ SOLN
10.0000 mL | INTRAMUSCULAR | Status: DC | PRN
  Administered 2016-11-20: 10 mL
  Filled 2016-11-20: qty 10

## 2016-11-20 MED ORDER — SODIUM CHLORIDE 0.9 % IV SOLN
Freq: Once | INTRAVENOUS | Status: AC
  Administered 2016-11-20: 12:00:00 via INTRAVENOUS

## 2016-11-20 MED ORDER — HEPARIN SOD (PORK) LOCK FLUSH 100 UNIT/ML IV SOLN
500.0000 [IU] | Freq: Once | INTRAVENOUS | Status: AC | PRN
  Administered 2016-11-20: 500 [IU]
  Filled 2016-11-20: qty 5

## 2016-11-20 MED ORDER — DIPHENHYDRAMINE HCL 25 MG PO CAPS
25.0000 mg | ORAL_CAPSULE | Freq: Once | ORAL | Status: AC
  Administered 2016-11-20: 25 mg via ORAL

## 2016-11-20 MED ORDER — ACETAMINOPHEN 325 MG PO TABS
ORAL_TABLET | ORAL | Status: AC
  Filled 2016-11-20: qty 2

## 2016-11-20 MED ORDER — ACETAMINOPHEN 325 MG PO TABS
650.0000 mg | ORAL_TABLET | Freq: Once | ORAL | Status: AC
  Administered 2016-11-20: 650 mg via ORAL

## 2016-11-20 MED ORDER — SODIUM CHLORIDE 0.9 % IV SOLN
3.5000 mg/kg | Freq: Once | INTRAVENOUS | Status: AC
  Administered 2016-11-20: 360 mg via INTRAVENOUS
  Filled 2016-11-20: qty 5

## 2016-11-20 MED ORDER — DIPHENHYDRAMINE HCL 25 MG PO CAPS
ORAL_CAPSULE | ORAL | Status: AC
  Filled 2016-11-20: qty 1

## 2016-11-20 NOTE — Patient Instructions (Signed)
[facility] Discharge Instructions for Patients Receiving Chemotherapy  Today you received the following chemotherapy agents: Kadcyla  To help prevent nausea and vomiting after your treatment, we encourage you to take your nausea medication as directed.    If you develop nausea and vomiting that is not controlled by your nausea medication, call the clinic.   BELOW ARE SYMPTOMS THAT SHOULD BE REPORTED IMMEDIATELY:  *FEVER GREATER THAN 100.5 F  *CHILLS WITH OR WITHOUT FEVER  NAUSEA AND VOMITING THAT IS NOT CONTROLLED WITH YOUR NAUSEA MEDICATION  *UNUSUAL SHORTNESS OF BREATH  *UNUSUAL BRUISING OR BLEEDING  TENDERNESS IN MOUTH AND THROAT WITH OR WITHOUT PRESENCE OF ULCERS  *URINARY PROBLEMS  *BOWEL PROBLEMS  UNUSUAL RASH Items with * indicate a potential emergency and should be followed up as soon as possible.  Feel free to call the clinic you have any questions or concerns. The clinic phone number is [phone].  Please show the CHEMO ALERT CARD at check-in to the Emergency Department and triage nurse.   

## 2016-11-20 NOTE — Progress Notes (Signed)
Pt tolerated infusion well. Pt monitored 30 minutes post infusion. Pt and VS stable at discharge.  

## 2016-11-21 ENCOUNTER — Telehealth: Payer: Self-pay | Admitting: Oncology

## 2016-11-21 NOTE — Telephone Encounter (Signed)
lvm to inform pt of added lab 6/14 at 1130 per sch msg

## 2016-11-24 ENCOUNTER — Telehealth: Payer: Self-pay

## 2016-11-24 NOTE — Telephone Encounter (Signed)
LVM with pt regarding INR of 3.4.  Pt to continue current dosage of warfarin and recheck INR on 6/14.

## 2016-11-27 ENCOUNTER — Other Ambulatory Visit: Payer: Self-pay

## 2016-12-02 ENCOUNTER — Telehealth: Payer: Self-pay

## 2016-12-02 NOTE — Telephone Encounter (Signed)
Pt was driving to chiropractor 430pm and did not get home until 6 am this morning. She got confused and lost. Daughter-in-law knows she needs to stop driving but pt will not really take the information from family. She would do better hearing it from a doctor. It appears the pt is not telling the family the whole truth or she is getting confused and mis-hearing.  Pt does not have a PCP and is only seeing Dr Jana Hakim.  S/w Mendel Ryder and set up appt with Dr Jana Hakim for 6/25 at 330.  Urgent inbasket sent

## 2016-12-04 ENCOUNTER — Other Ambulatory Visit: Payer: Self-pay

## 2016-12-08 ENCOUNTER — Other Ambulatory Visit (HOSPITAL_COMMUNITY)
Admission: RE | Admit: 2016-12-08 | Discharge: 2016-12-08 | Disposition: A | Discharge status: Home / Self Care | Payer: Medicare Other | Source: Other Acute Inpatient Hospital | Attending: Adult Health | Admitting: Adult Health

## 2016-12-08 ENCOUNTER — Ambulatory Visit (HOSPITAL_BASED_OUTPATIENT_CLINIC_OR_DEPARTMENT_OTHER): Payer: Medicare Other | Admitting: Adult Health

## 2016-12-08 ENCOUNTER — Ambulatory Visit: Payer: Self-pay

## 2016-12-08 VITALS — BP 120/43 | HR 82 | Temp 98.2°F | Resp 18 | Ht 69.0 in | Wt 197.5 lb

## 2016-12-08 DIAGNOSIS — Z7901 Long term (current) use of anticoagulants: Secondary | ICD-10-CM | POA: Diagnosis not present

## 2016-12-08 DIAGNOSIS — C7951 Secondary malignant neoplasm of bone: Secondary | ICD-10-CM

## 2016-12-08 DIAGNOSIS — I2699 Other pulmonary embolism without acute cor pulmonale: Secondary | ICD-10-CM

## 2016-12-08 DIAGNOSIS — C50012 Malignant neoplasm of nipple and areola, left female breast: Secondary | ICD-10-CM | POA: Diagnosis present

## 2016-12-08 DIAGNOSIS — C50011 Malignant neoplasm of nipple and areola, right female breast: Secondary | ICD-10-CM

## 2016-12-08 DIAGNOSIS — R410 Disorientation, unspecified: Secondary | ICD-10-CM | POA: Diagnosis not present

## 2016-12-08 DIAGNOSIS — C7800 Secondary malignant neoplasm of unspecified lung: Secondary | ICD-10-CM

## 2016-12-08 DIAGNOSIS — C50912 Malignant neoplasm of unspecified site of left female breast: Secondary | ICD-10-CM | POA: Diagnosis not present

## 2016-12-08 DIAGNOSIS — I519 Heart disease, unspecified: Secondary | ICD-10-CM

## 2016-12-08 DIAGNOSIS — C50919 Malignant neoplasm of unspecified site of unspecified female breast: Secondary | ICD-10-CM

## 2016-12-08 DIAGNOSIS — R531 Weakness: Secondary | ICD-10-CM

## 2016-12-08 DIAGNOSIS — C50911 Malignant neoplasm of unspecified site of right female breast: Secondary | ICD-10-CM | POA: Diagnosis present

## 2016-12-08 DIAGNOSIS — C7931 Secondary malignant neoplasm of brain: Secondary | ICD-10-CM

## 2016-12-08 DIAGNOSIS — Z170 Estrogen receptor positive status [ER+]: Secondary | ICD-10-CM

## 2016-12-08 LAB — COMPREHENSIVE METABOLIC PANEL
ALBUMIN: 3.4 g/dL — AB (ref 3.5–5.0)
ALK PHOS: 107 U/L (ref 38–126)
ALT: 59 U/L — ABNORMAL HIGH (ref 14–54)
ANION GAP: 7 (ref 5–15)
AST: 93 U/L — ABNORMAL HIGH (ref 15–41)
BILIRUBIN TOTAL: 0.8 mg/dL (ref 0.3–1.2)
BUN: 20 mg/dL (ref 6–20)
CALCIUM: 9.5 mg/dL (ref 8.9–10.3)
CO2: 29 mmol/L (ref 22–32)
Chloride: 107 mmol/L (ref 101–111)
Creatinine, Ser: 0.64 mg/dL (ref 0.44–1.00)
GFR calc non Af Amer: >60 mL/min (ref >60)
Glucose, Bld: 136 mg/dL — ABNORMAL HIGH (ref 65–99)
POTASSIUM: 3.3 mmol/L — AB (ref 3.5–5.1)
SODIUM: 143 mmol/L (ref 135–145)
TOTAL PROTEIN: 7.8 g/dL (ref 6.5–8.1)

## 2016-12-08 LAB — CBC WITH DIFFERENTIAL/PLATELET
BASO%: 0.3 % (ref 0.0–2.0)
BASOS ABS: 0 10*3/uL (ref 0.0–0.1)
EOS ABS: 0.1 10*3/uL (ref 0.0–0.5)
EOS%: 1.4 % (ref 0.0–7.0)
HEMATOCRIT: 31.3 % — AB (ref 34.8–46.6)
HEMOGLOBIN: 10.2 g/dL — AB (ref 11.6–15.9)
LYMPH#: 0.8 10*3/uL — AB (ref 0.9–3.3)
LYMPH%: 14.6 % (ref 14.0–49.7)
MCH: 31.3 pg (ref 25.1–34.0)
MCHC: 32.6 g/dL (ref 31.5–36.0)
MCV: 95.9 fL (ref 79.5–101.0)
MONO#: 0.5 10*3/uL (ref 0.1–0.9)
MONO%: 9.4 % (ref 0.0–14.0)
NEUT#: 3.8 10*3/uL (ref 1.5–6.5)
NEUT%: 74.3 % (ref 38.4–76.8)
Platelets: 182 10*3/uL (ref 145–400)
RBC: 3.27 10*6/uL — ABNORMAL LOW (ref 3.70–5.45)
RDW: 17.8 % — AB (ref 11.2–14.5)
WBC: 5.2 10*3/uL (ref 3.9–10.3)

## 2016-12-08 LAB — PROTIME-INR
INR: 1.7 — AB (ref 2.00–3.50)
PROTIME: 20.4 s — AB (ref 10.6–13.4)

## 2016-12-08 MED ORDER — ANASTROZOLE 1 MG PO TABS: 1.0000 mg | ORAL_TABLET | Freq: Every day | ORAL | 3 refills | Status: AC

## 2016-12-08 NOTE — Progress Notes (Addendum)
ID: Linda [name]   DOB: February 03, 1946  MR#: [id number]  YNW#:[id number]  PCP: Patient, No Pcp Per GYN: SU: Linda [name] OTHER MD: Crissie Reese, Linna Hoff Bensimhon  CHIEF COMPLAINT:  Bilateral Breast Cancers  CURRENT TREATMENT: Anastrozole, T-DM 1, zolendrnate, rivaroxaban  BREAST CANCER HISTORY: From the original intake note:  The patient noted a small amount of drainage from her left breast December of 2012. She brought it to her gynecologist's attention in January of 2013 and was set up for bilateral diagnostic mammography at the [facility] Center July 24, 2011. This was the patient's first ever mammogram. It showed a large irregular mass in the left retroareolar region extending to the nipple, with nipple retraction and skin thickening. This measured approximately 8.4 cm. It was associated with pleomorphic microcalcifications. By exam there was moderate distortion and retraction of the nipple with a large palpable ill-defined area in the retroareolar region. In the right right breast there was also a 2 cm hard mass palpated. Ultrasound of the right breast mass showed a complex cystic/solid area measuring 1.9 cm. Ultrasound of the right axilla was negative. Ultrasound of the left breast showed a large hypoechoic mass measuring at least 3.8 cm. The left axilla showed several adjacent abnormal appearing lymph nodes.  With this information biopsy of the right and left breast masses were obtained the same day, and showed ([id number])   (a) on the right, and invasive ductal carcinoma with papillary features, estrogen and progesterone receptor positive, HER-2 negative, with an MIB-1 of 10%.   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 The MIB-1 was 60% for the left-sided tumor.   With this information the patient was presented at the multidisciplinary breast cancer conference 08/06/2011. Subsequent  evaluation and treatments are as detailed below.  INTERVAL HISTORY: Linda [name] returns today for follow-up of her HER-2/neu positive stage IV breast cancer. She continues on ado-trastuzumab emtansine every 21 days and tolerates that well.  She is doing moderately well today.  She is here today accompanied by her son Linda [name] and daughter-in-law Linda [name].  She is taking the coumadin daily 23m.  Her last INR was 3.4.  She has missed her last two INR appts telling me she didn't realize she had them.  She has moved recently to live with her oldest son DShanon Browand his wife Linda [name]  She has been taking coumadin daily however has been falling a lot recently and has multiple bruises over her body.  She also drove herself to an appointment recently and ended up on the other side of [place] due to her confusion.   REVIEW OF SYSTEMS: PSatonyais increasingly weak, and has decreased oral intake.  She is having a difficult time getting to the bathroom and toileting.  She is occasionally incontinent and wears depends.  Her family is very concerned about her decrease in mentation and significant decline.    PAST MEDICAL HISTORY: Past Medical History:  Diagnosis Date  . Acute on chronic diastolic CHF (congestive heart failure) ([facility]   . Acute pulmonary embolism ([facility] 12/13/2014  . Allergy   . Blood transfusion without reported diagnosis   . Breast cancer ([facility] 07/30/11 dx   Right  invasive ductal ca 7 0'clock,& left breast=invasive ductal ca  and dcis, left axilla nlymph node, metastatic ca  . Bronchitis    hx  . Cancer ([facility] 08-13-11   07-31-11-Dx. Bilarteral Breast cancer-left greater than rt.  .[name]  Cancer of central portion of left female breast ([place]) 08/01/2011  . Diabetes mellitus without complication ([place])   . GERD (gastroesophageal reflux disease)    doing well  . Hematuria, undiagnosed cause 08-13-11   Being evaluated by [facility] 08-14-11  . History of radiation therapy 02/20/12-04/15/12   left breast,total  60.4 Gy  . Hypertension   . Radiation-induced dermatitis 03/26/2012   Using radioplex cream, plus neosporin.   . S/P radiation therapy 10/09/14-10/27/14   whole brain 37.5Gy/34f  . Seasonal allergies   . Seroma 02/04/12   right breast  200cc removed  erythema on right side  . Wears partial dentures    wears upper and lower partial    PAST SURGICAL HISTORY: Past Surgical History:  Procedure Laterality Date  . BREAST SURGERY    . CHEST TUBE INSERTION Left 12/14/2014   Procedure: CHEST TUBE INSERTION;  Surgeon: CRexene Alberts MD;  Location: [facility]  Service: Thoracic;  Laterality: Left;  . child birth  08-13-11   x3 -NVD  . MASTECTOMY W/ SENTINEL NODE BIOPSY  01/21/2012   Procedure: MASTECTOMY WITH SENTINEL LYMPH NODE BIOPSY;  Surgeon: DHarl Bowie MD;  [place] [facility]  Service: General;  Laterality: Bilateral;  Left modified radical mastectomy, Rt mastectomy with Sentinel lymphnode biospy  . PORT-A-CATH REMOVAL Right 12/22/2012   Procedure: REMOVAL PORT-A-CATH;  Surgeon: DHarl Bowie MD;  [facility]  Service: General;  Laterality: Right;  . PORTACATH PLACEMENT  08/15/2011   Procedure: INSERTION PORT-A-CATH;  Surgeon: DHarl Bowie MD;  [facility];  Service: General;  Laterality: N/A;  . SUBXYPHOID PERICARDIAL WINDOW N/A 12/14/2014   Procedure: SUBXYPHOID PERICARDIAL WINDOW;  Surgeon: CRexene Alberts MD;  [place] [facility]  Service: Thoracic;  Laterality: N/A;  . TEE WITHOUT CARDIOVERSION N/A 12/14/2014   Procedure: TRANSESOPHAGEAL ECHOCARDIOGRAM (TEE);  Surgeon: CRexene Alberts MD;  [facility];  Service: Thoracic;  Laterality: N/A;    FAMILY HISTORY Family History  Problem Relation Age of Onset  . Heart disease Mother   . Cancer Mother 467      breast, , 727deceased  . Heart attack Father   . Cancer Sister [phone]      breast  . Colon cancer Neg Hx   The patient's father died from a heart attack at the age of 838  The patient's mother died from apparently heart problems at the age of 854 The patient had no brothers. She has 3 sisters. One of her sisters was diagnosed with breast cancer in her mid 672s The patient does not know if his sister was ever genetically tested. The patient's mother had a mastectomy at the age of 352 presumably for breast cancer. There is no other history of breast or in cancer in the family to her knowledge.   GYNECOLOGIC HISTORY:  She does not recall when she had menarche. She had her first child at age 71 She is GX P3. She underwent menopause in her mid 46s She never took hormone replacement.   SOCIAL HISTORY:  She works seasonally for [employer] but is planning to retire as of April 1720. 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 She moved in with her son CGerald StabsSept 2017--[place]line pilot His wife is a nMarine scientistthere is a 950-monthld child in the house (as of March  2018) Son Linda [name] lives at [facility] and is a Advertising account executive in addition to having the [employer] The patient attends a local [employer] here   ADVANCED DIRECTIVES: In place. The patient has opted for a DO NOT RESUSCITATE order. Her daughter Linda [name] is her healthcare power of attorney. Neck affect can be reached at [phone].  HEALTH MAINTENANCE: Social History  Substance Use Topics  . Smoking status: Never Smoker  . Smokeless tobacco: Never Used  . Alcohol use 0.0 oz/week     Comment: rare- occ.     Colonoscopy: Never  PAP: Feb 2013  Bone density: November 2013, normal  Lipid panel:   No Known Allergies  Current Outpatient Prescriptions  Medication Sig Dispense Refill  . anastrozole (ARIMIDEX) 1 MG tablet Take 1 tablet (1 mg total) by mouth daily. 90 tablet 3  . anastrozole (ARIMIDEX) 1 MG tablet Take 1 tablet (1 mg total) by mouth daily. 90 tablet 3  . b complex vitamins  tablet Take 1 tablet by mouth daily.    . cholecalciferol (VITAMIN D) 1000 UNITS tablet Take 1 tablet (1,000 Units total) by mouth daily. 100 tablet 12  . furosemide (LASIX) 20 MG tablet Take 1 tablet (20 mg total) by mouth daily as needed. (Patient taking differently: Take 20 mg by mouth daily as needed for fluid. ) 30 tablet 12  . Multiple Vitamins-Iron (MULTIVITAMINS WITH IRON) TABS Take 1 tablet by mouth daily.    . pantoprazole (PROTONIX) 40 MG tablet Take 1 tablet (40 mg total) by mouth daily as needed. 30 tablet 0  . spironolactone (ALDACTONE) 25 MG tablet Take 0.5 tablets (12.5 mg total) by mouth daily. 15 tablet 3  . warfarin (COUMADIN) 5 MG tablet Take 1 tablet (5 mg total) by mouth daily. 30 tablet 0   No current facility-administered medications for this visit.     OBJECTIVE:    Vitals:   12/08/16 1618  BP: (!) 120/43  Pulse: 82  Resp: 18  Temp: 98.2 F (36.8 C)     Body mass index is 29.17 kg/m.    ECOG FS: 1 Filed Weights   12/08/16 1618  Weight: 197 lb 8 oz (89.6 kg)  GENERAL: Older woman sitting in wheelchair HEENT:  Sclerae anicteric.  Oropharynx clear and moist. No ulcerations or evidence of oropharyngeal candidiasis. Neck is supple.  NODES:  No cervical, supraclavicular, or axillary lymphadenopathy palpated.  LUNGS:  Clear to auscultation bilaterally.  No wheezes or rhonchi. HEART:  Regular rate and rhythm. No murmur appreciated. ABDOMEN:  Soft, nontender.  Positive, normoactive bowel sounds. No organomegaly palpated. MSK:  No focal spinal tenderness to palpation.  EXTREMITIES:  No peripheral edema.   SKIN:  Ecchymosis noted on posterior scalp, bilateral hips, left lower leg, right lower leg, and arm. NEURO: Forde Dandy is inappropriate given current situation     LAB RESULTS: CBC Latest Ref Rng & Units 12/08/2016 11/20/2016 10/30/2016  WBC 3.9 - 10.3 10e3/uL 5.2 4.6 5.2  Hemoglobin 11.6 - 15.9 g/dL 10.2(L) 9.7(L) 9.1(L)  Hematocrit 34.8 - 46.6 % 31.3(L)  28.6(L) 27.3(L)  Platelets 145 - 400 10e3/uL 182 207 208   CMP Latest Ref Rng & Units 12/08/2016 11/20/2016 10/30/2016  Glucose 65 - 99 mg/dL 136(H) 126 135  BUN 6 - 20 mg/dL 20 20.1 16.6  Creatinine 0.44 - 1.00 mg/dL 0.64 0.7 0.8  Sodium 135 - 145 mmol/L 143 143 143  Potassium 3.5 - 5.1 mmol/L 3.3(L) 3.5 3.8  Chloride 101 -  111 mmol/L 107 - -  CO2 22 - 32 mmol/L 29 30(H) 30(H)  Calcium 8.9 - 10.3 mg/dL 9.5 9.9 9.4  Total Protein 6.5 - 8.1 g/dL 7.8 8.1 7.5  Total Bilirubin 0.3 - 1.2 mg/dL 0.8 0.46 0.83  Alkaline Phos 38 - 126 U/L 107 142 112  AST 15 - 41 U/L 93(H) 69(H) 41(H)  ALT 14 - 54 U/L 59(H) 52 29    STUDIES: Most recent brain MRI, 09/22/2016 showed no evidence of recurrent metastases to the brain   ASSESSMENT: 71 y.o.  [place] woman   (1)  status post bilateral breast biopsies 07/24/2011, showing,     (a) on the right, a clinical T2 N0 papillary/ductal breast cancer, estrogen and progesterone receptor positive, HER-2 negative, with an MIB-1 of 10%;    (b) on the left, a clinical T3 N1, stage IIIA invasive ductal carcinoma, grade 3, triple positive, with an MIB-1 of 60%.  (2)  Status post 4 dose dense cycles of doxorubicin/ cyclophosphamide followed by 4 dose dense cycles of paclitaxel and trastuzumab completed 12/09/2011  (3) the trastuzumab was continued for a total of one year (to 11/01/2012). Final echo on 11/04/2012 showing a well preserved ejection fraction of 55-60%.  (4) s/p bilateral mastectomies 01/21/2012 showing  (a) on the Right, an 8 mm invasive papillary carcinoma, grade 1, ypT1b ypN0  (b) on the Left, multiple microscopic foci of residual  Invasive ductal carcinoma with evidence of dermal lymphatic involvement, pyT1a/T4 pyN0  (5)  Postmastectomy radiation, completed 04/15/2012  (6) Started anastrazole 04/16/2012; normal dexa scan 05/19/2014 at the [facility] 09/11/2014: brain, bones, left effusion (7) CT angiogram 09/11/2014  shows new left pericardial effusion and new mediastinal and hilar lymphadenopathy; there were no suspicious upper abdominal findings; Cytology from the left effusion 09/11/2014 ([id number]) shows malignant cells which are HER-2 positive  (8) Whole body bone scan on 09/25/14 showed metastatic foci throughout axial and appendicular skeleton. Areas of most potential concern include disease in the thoracic spine, disease in the right acetabulum, right superior pubic ramus and possibly proximal right femur, disease in the right humeral shaft and in both femoral shafts.  (8) Started trastuzumab/pertuzumab 09/26/2014, discontinued after 11/29/2014 dose, w progression  (a) T-DM1 started 01/02/2015, repeated every 21 days  (b) echocardiogram 09/02/2016 hows an ejection fraction of 60-65 %  (c echocardiogram 09/20/2016 shows an ejection fraction in the 65-70%)   (9) Brain MRI on 09/25/14 was positive for numerous small enhancing brain mets and calvarium/bone mets   (a) whole brain radiation on 10/09/14--10/27/2014  (b) brain MRI 01/07/2015 shows a complete clinical response  (c) REPEAT BRAIN mri 03/18/2015 SHOWS NO NEW LESIONS  (d) repeat brain MRI with and without contrast 06/29/2015 continues to show no recurrent lesions in the brain  (e) brain MRI 10/12/2015 shows no evidence of recurrent brain metastases. She does have a large left mastoid effusion with trace right mastoid fluid.  (f) brain MRI 02/12/2016 stable--no evidence of active disease  (g) brain MRI 05/19/2016 stable, no evidence of active disease  (10) started zolendronate 11/29/2014, repeated every 12 weeks  11)  left pleural effusion re-tapped 12/08/2014 (a) left chest tube placement 12/14/2014, removedd 12/21/2014  (12) large pericardial effusion per echo 12/13/2014  (a) pericardial window placement 12/14/2014; fluid is hemorrhagic; path negative  (13) Right-sided pulmonary emboli noted on CT scan 12/13/2014--  never anticoagulated  (a) no pulmonary emboli demonstrated on non-dedicated chest CT 04/13/2015  (b) lateral lower extremity Doppler 09/18/2016  negative  (c) left upper extremity and right subclavian vein Doppler studies 09/18/2016 negative  (d) CT angiogram of the chest 09/17/2016 shows acute pulmonary embolus bilateral lower lobes with right heart strain   (i) on rivaroxaban until 11/12/2016   (ii) started Coumadin 11/17/2016  PLAN: After review with the family and discussion, it is clear that Jennika's mental state is declining.  We will get labs today, a CBC, CMET and INR.  We will also order a brain MRI to be done as soon as possible.  I have called the social workers to work with the patient's family to either get Christinna placed in a facility, or see if shet is eligible to have assistance in the home.    In the meantime, the patient was recommended to use her walker wherever she goes, NOT to drive, and to have someone else keeping up with her appointments and medications.    The patient also saw Dr. Jana Hakim, who reviewed this with the patient as well today.     Scot Dock 12/08/2016   ADDENDUM: Linda [name] is having more symptoms of dementia, and it is not clear from the family members present today (not the son she lives with) whether she can stay at her son 38 house unsupervised.  Certainly the patient should not be driving and I made that very clear to her and her family. I suggested that she have an emergency alert neck less in case he falls, since she is unable to get up once she does fall. We talked about her using a walker at all times and if she does not use a walker she will need to be in a wheelchair at all times.  The family is interested in having some help at home. I have directed them to our social workers regarding this.  We are going to obtain a brain MRI to make sure there has not been progression, but frequently the radiation to the brain alone can cause  sufficient damage that significant dementia can result.  Once we have the results of the MRI I will meet with the patient that we will consider Ritalin assuming there is no evidence of metastatic recurrence to the brain. If there is, we will have to decide again on radiation versus hospice.  I personally saw this patient and performed a substantive portion of this encounter with the listed APP documented above.   Chauncey Cruel, MD Medical Oncology and Hematology [facility] [address] [place],  [address] Tel. [phone]    Fax. [phone]

## 2016-12-09 ENCOUNTER — Encounter: Payer: Self-pay | Admitting: *Deleted

## 2016-12-09 ENCOUNTER — Telehealth: Payer: Self-pay | Admitting: *Deleted

## 2016-12-09 ENCOUNTER — Other Ambulatory Visit: Payer: Self-pay | Admitting: *Deleted

## 2016-12-09 ENCOUNTER — Other Ambulatory Visit: Payer: Self-pay | Admitting: Adult Health

## 2016-12-09 DIAGNOSIS — C50011 Malignant neoplasm of nipple and areola, right female breast: Secondary | ICD-10-CM

## 2016-12-09 DIAGNOSIS — C50012 Malignant neoplasm of nipple and areola, left female breast: Principal | ICD-10-CM

## 2016-12-09 NOTE — Telephone Encounter (Signed)
Received call from daughter in law to discuss labs in detail about pt's INR and other labs. Daughter in law will have to do some investigating to find out if pt is taking her Coumadin daily as prescribed. Since pt's decline of health she said she wasn't for sure. Daughter in law told this nurse to call her back this evening after she find out. She was able to tell me pt doesn't eat any green leafy vegetables at all so this is an unlikely cause of the drop in her numbers

## 2016-12-09 NOTE — Telephone Encounter (Signed)
Called pt's son and daughter in law to inform them of patient's upcoming appt on tomorrow 12/10/2016 for MRI at [facility] at 9p. Family said they will get her there. Concerning the medication, I told the daughter in law that pt needs observation when taking medicine to make sure she is indeed taking it. We will recheck INR levels again with labs. Verbalized understanding. No further concerns.Message to be fwd to [employer].

## 2016-12-09 NOTE — Telephone Encounter (Signed)
Called pt's daughter in law to discuss lab results. No answer but left a detailed message to VM and told her I will call her again today, in the meantime if she gets the message she can call this nurse back at [phone]. Message to be fwd to [employer].

## 2016-12-09 NOTE — Progress Notes (Signed)
[place] Work  Clinical Social Work was referred by Futures trader for assessment of psychosocial needs.  Clinical Social Worker contacted patient's son at home to offer support and assess for needs.  Patients son stated his mother is currently living with him and his wife, and is now requiring increased care and assistance with ADL's.  CSW and patient's son discussed resources including; home health, home care, SNF and ALF.  Patient son also requested information on Hospice and services.  CSW provided brief education on Hospice care and services, and encouraged patient and family to discuss with patients physician.  Patient son was agreeable to a home health, home care, and palliative care associates referral.  CSW shared this information with healthcare team and orders/referrals were made.  Patients son also plans to review patients secondary insurance to determine if any additional home care benefits are available.  CSW provided contact information and encouraged patient/family to call with questions or concerns.                   Johnnye Lana, MSW, LCSW, OSW-C Clinical Social Worker [facility] [phone]

## 2016-12-10 ENCOUNTER — Telehealth: Payer: Self-pay | Admitting: *Deleted

## 2016-12-10 ENCOUNTER — Ambulatory Visit (HOSPITAL_COMMUNITY)
Admission: RE | Admit: 2016-12-10 | Discharge: 2016-12-10 | Disposition: A | Discharge status: Home / Self Care | Payer: Medicare Other | Source: Ambulatory Visit | Attending: Adult Health | Admitting: Adult Health

## 2016-12-10 DIAGNOSIS — C50012 Malignant neoplasm of nipple and areola, left female breast: Secondary | ICD-10-CM | POA: Diagnosis present

## 2016-12-10 DIAGNOSIS — C7931 Secondary malignant neoplasm of brain: Secondary | ICD-10-CM

## 2016-12-10 DIAGNOSIS — R410 Disorientation, unspecified: Secondary | ICD-10-CM | POA: Diagnosis not present

## 2016-12-10 DIAGNOSIS — J328 Other chronic sinusitis: Secondary | ICD-10-CM | POA: Insufficient documentation

## 2016-12-10 DIAGNOSIS — C50011 Malignant neoplasm of nipple and areola, right female breast: Secondary | ICD-10-CM | POA: Insufficient documentation

## 2016-12-10 MED ORDER — GADOBENATE DIMEGLUMINE 529 MG/ML IV SOLN
20.0000 mL | Freq: Once | INTRAVENOUS | Status: AC | PRN
  Administered 2016-12-10: 18 mL via INTRAVENOUS

## 2016-12-10 NOTE — Telephone Encounter (Signed)
This RN spoke with Linda [name] per her call regarding dosing of coumadin - per INR of 1.7.  Per pill count of the patient's 5 mg coumadin - it seems like pt has been taking 1 tablet a day.  Linda [name] also wanted to let this office know that pt was living with Linda [name] and Linda [name] ( pt's son ) but due to Linda [name] having a new child - pt relocated to her son's Linda [name]'s home.  At [place] home pt is not able to be supervised as much due to Linda [name] is working.  Linda [name] also stated concerns over his ability to provide appropriate care for the patient.  Linda [name] inquired about receiving a call from our Social Workers and is requesting them to contact her due to her being available and be the family contact for best communication.  She also asked about the difference of palliative and hospice " and we do we know to make that transition ?"  This RN discussed above including presently needing to get good understanding of pt's medical status regarding decline.  Plan is for this RN to inquire about INR and current coumadin dose and return call to [name] tomorrow with results of brain MRI being performed tonight.  This note will be forwarded to MD, Linda [name] and our social work department for communication.

## 2016-12-11 ENCOUNTER — Other Ambulatory Visit: Payer: Self-pay

## 2016-12-11 ENCOUNTER — Other Ambulatory Visit: Payer: Self-pay | Admitting: *Deleted

## 2016-12-11 ENCOUNTER — Ambulatory Visit: Payer: Self-pay

## 2016-12-11 ENCOUNTER — Telehealth: Payer: Self-pay | Admitting: Adult Health

## 2016-12-11 MED ORDER — DEXAMETHASONE 4 MG PO TABS: 8.0000 mg | ORAL_TABLET | Freq: Two times a day (BID) | ORAL | 0 refills | Status: DC

## 2016-12-11 NOTE — Telephone Encounter (Signed)
Reviewed patient status with her son, Shanon Brow.  Patient candidate for hospice.  I reviewed MRI revealing brain metastasis with vasogenic edema and mass effect.  I prescribed Dexamethasone BID for her to take and reviewed purpose with her son.  I encouraged him to get this prescription and give it to her ASAP.  They will return tomorrow at 1030 am for an appointment with Dr. Jana Hakim to discuss the patient and her transition to hospice as well as review the MRI.

## 2016-12-11 NOTE — Telephone Encounter (Signed)
Called to review progression with family.  LMOM to return call.

## 2016-12-12 ENCOUNTER — Encounter: Payer: Self-pay | Admitting: *Deleted

## 2016-12-12 ENCOUNTER — Telehealth: Payer: Self-pay

## 2016-12-12 ENCOUNTER — Ambulatory Visit (HOSPITAL_BASED_OUTPATIENT_CLINIC_OR_DEPARTMENT_OTHER): Payer: Medicare Other | Admitting: Oncology

## 2016-12-12 VITALS — BP 124/57 | HR 87 | Temp 98.0°F | Resp 18

## 2016-12-12 DIAGNOSIS — C50112 Malignant neoplasm of central portion of left female breast: Secondary | ICD-10-CM

## 2016-12-12 DIAGNOSIS — C7800 Secondary malignant neoplasm of unspecified lung: Secondary | ICD-10-CM

## 2016-12-12 DIAGNOSIS — C50911 Malignant neoplasm of unspecified site of right female breast: Secondary | ICD-10-CM

## 2016-12-12 DIAGNOSIS — C7931 Secondary malignant neoplasm of brain: Secondary | ICD-10-CM

## 2016-12-12 DIAGNOSIS — I2699 Other pulmonary embolism without acute cor pulmonale: Secondary | ICD-10-CM

## 2016-12-12 DIAGNOSIS — Z170 Estrogen receptor positive status [ER+]: Secondary | ICD-10-CM

## 2016-12-12 DIAGNOSIS — C50919 Malignant neoplasm of unspecified site of unspecified female breast: Secondary | ICD-10-CM

## 2016-12-12 DIAGNOSIS — C50912 Malignant neoplasm of unspecified site of left female breast: Principal | ICD-10-CM

## 2016-12-12 MED ORDER — DEXAMETHASONE 4 MG PO TABS: 8.0000 mg | ORAL_TABLET | Freq: Two times a day (BID) | ORAL | 0 refills | Status: AC

## 2016-12-12 MED FILL — LORazepam 0.5 MG TABS: 0.5 | 10 days supply | Qty: 30 | Fill #0

## 2016-12-12 NOTE — Telephone Encounter (Signed)
Daughter in law called stating dex rx was not at [place]. The Rx [facility] to [facility] [address]. She requested it be sent to [facility] op pharm. [facility] and [facility] to [facility].

## 2016-12-12 NOTE — Progress Notes (Signed)
[place] Work  Holiday representative met with patient and family, both sons and DIL, Transport planner at [facility] after visit with Dr. Jana Hakim to offer support and assess for needs.  They report plan is for referral to hospice services at this time. Family is hopeful for assistance with placement. CSW explained intake process for [facility] and they would assess and assist pt/family based on current needs. tHey appreciated check in and support, denied other questions.   Loren Racer, LCSW, OSW-C Clinical Social Worker [place]  [place] Phone: [phone] Fax: [phone]

## 2016-12-12 NOTE — Progress Notes (Signed)
[place] Work  Clinical Social Work was referred by Marine scientist and daughter in law for care needs.  Clinical Social Worker contacted daughter in Sports coach, [place] as directed. CSW explained that CSW team member had contacted son, Shanon Brow to review resources. CSW briefly explained levels of care and resources available. CSW explained medicare would not cover SNF placement at this time, as pt does not have "skilled need" or 3 night qualifying stay.  Medicare does not cover assisted living placement. Pt appears to have financial means to supplement care at home, per [place]. Hospice would be a great support to family as well. Family appears will greatly benefit from meeting with medical team today in order to come up with appropriate care plan. CSW plans to come and assist with care need questions at end of meeting.    Clinical Social Work interventions:  Resource education and support  Loren Racer, LCSW, OSW-C Clinical Social Worker [place]  [place] Phone: [phone] Fax: [phone]

## 2016-12-13 NOTE — Progress Notes (Signed)
ID: Linda [name]   DOB: 1945/09/18  MR#: [id number]  RKY#:[id number]  PCP: Patient, No Pcp Per GYN: SU: Coralie Keens OTHER MD: Crissie Reese, Linna Hoff Bensimhon  CHIEF COMPLAINT:  Bilateral Breast Cancers  CURRENT TREATMENT: Comfort/palliative care with hospice support   INTERVAL HISTORY: Linda [name] returns today accompanied by her sons Linda [name] and Linda [name] and her daughter-in-law Linda [name]. We saw Linda [name] a few days ago with increasing signs of dementia or brain metastases. She had a brain MRI yesterday, 12/11/2016, which shows extensive recurrence of her brain metastases with significant edema. The family and the patient is here to discuss those findings.  REVIEW OF SYSTEMS: Linda [name] is still able to follow the discussion, and she understood that her disease is back and that at this point my recommendation is for comfort and palliative care. The reason is that she already has a very borderline cognitive status and further radiation is very likely to leave her completely disabled from a cognitive point of view. Of course this is not curable and likely would lead to her demise from complications of the treatment if not from the disease itself. The children are very clear that they do not feel she can be safe at home. They simply do not have the level of support available for her or the finances to provide it at home. The patient has continued to fall, frequently stays in bed soiled until someone can clean her, there are continuing issues with nutrition and disorientation. Fortunately she has had no seizures and pain currently is minimal.    BREAST CANCER HISTORY: From the original intake note:  The patient noted a small amount of drainage from her left breast December of 2012. She brought it to her gynecologist's attention in January of 2013 and was set up for bilateral diagnostic mammography at the breast Center July 24, 2011. This was the patient's first ever mammogram. It showed a large irregular mass in the  left retroareolar region extending to the nipple, with nipple retraction and skin thickening. This measured approximately 8.4 cm. It was associated with pleomorphic microcalcifications. By exam there was moderate distortion and retraction of the nipple with a large palpable ill-defined area in the retroareolar region. In the right right breast there was also a 2 cm hard mass palpated. Ultrasound of the right breast mass showed a complex cystic/solid area measuring 1.9 cm. Ultrasound of the right axilla was negative. Ultrasound of the left breast showed a large hypoechoic mass measuring at least 3.8 cm. The left axilla showed several adjacent abnormal appearing lymph nodes.  With this information biopsy of the right and left breast masses were obtained the same day, and showed ([id number])   (a) on the right, and invasive ductal carcinoma with papillary features, estrogen and progesterone receptor positive, HER-2 negative, with an MIB-1 of 10%.   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 The MIB-1 was 60% for the left-sided tumor.   With this information the patient was presented at the multidisciplinary breast cancer conference 08/06/2011. Subsequent evaluation and treatments are as detailed below.   PAST MEDICAL HISTORY: Past Medical History:  Diagnosis Date  . Acute on chronic diastolic CHF (congestive heart failure) ([place])   . Acute pulmonary embolism ([place]) 12/13/2014  . Allergy   . Blood transfusion without reported diagnosis   . Breast cancer ([place]) 07/30/11 dx   Right  invasive ductal ca 7 0'clock,& left breast=invasive ductal  ca  and dcis, left axilla nlymph node, metastatic ca  . Bronchitis    hx  . Cancer ([place]) 08-13-11   07-31-11-Dx. Bilarteral Breast cancer-left greater than rt.  . Cancer of central portion of left female breast ([place]) 08/01/2011  . Diabetes mellitus without  complication ([place])   . GERD (gastroesophageal reflux disease)    doing well  . Hematuria, undiagnosed cause 08-13-11   Being evaluated by [facility] 08-14-11  . History of radiation therapy 02/20/12-04/15/12   left breast,total 60.4 Gy  . Hypertension   . Radiation-induced dermatitis 03/26/2012   Using radioplex cream, plus neosporin.   . S/P radiation therapy 10/09/14-10/27/14   whole brain 37.5Gy/65f  . Seasonal allergies   . Seroma 02/04/12   right breast  200cc removed  erythema on right side  . Wears partial dentures    wears upper and lower partial    PAST SURGICAL HISTORY: Past Surgical History:  Procedure Laterality Date  . BREAST SURGERY    . CHEST TUBE INSERTION Left 12/14/2014   Procedure: CHEST TUBE INSERTION;  Surgeon: CRexene Alberts MD;  [facility]  Service: Thoracic;  Laterality: Left;  . child birth  08-13-11   x3 -NVD  . MASTECTOMY W/ SENTINEL NODE BIOPSY  01/21/2012   Procedure: MASTECTOMY WITH SENTINEL LYMPH NODE BIOPSY;  Surgeon: DHarl Bowie MD;  [facility]  Service: General;  Laterality: Bilateral;  Left modified radical mastectomy, Rt mastectomy with Sentinel lymphnode biospy  . PORT-A-CATH REMOVAL Right 12/22/2012   Procedure: REMOVAL PORT-A-CATH;  Surgeon: DHarl Bowie MD;  [facility]  Service: General;  Laterality: Right;  . PORTACATH PLACEMENT  08/15/2011   Procedure: INSERTION PORT-A-CATH;  Surgeon: DHarl Bowie MD;  Location: [facility];  Service: General;  Laterality: N/A;  . SUBXYPHOID PERICARDIAL WINDOW N/A 12/14/2014   Procedure: SUBXYPHOID PERICARDIAL WINDOW;  Surgeon: CRexene Alberts MD;  [facility]  Service: Thoracic;  Laterality: N/A;  . TEE WITHOUT CARDIOVERSION N/A 12/14/2014   Procedure: TRANSESOPHAGEAL ECHOCARDIOGRAM (TEE);  Surgeon: CRexene Alberts MD;  [facility];  Service: Thoracic;  Laterality: N/A;    FAMILY HISTORY Family History  Problem Relation Age  of Onset  . Heart disease Mother   . Cancer Mother 486      breast, , [id number]  . Heart attack Father   . Cancer Sister [phone]      breast  . Colon cancer Neg Hx   The patient's father died from a heart attack at the age of 873 The patient's mother died from apparently heart problems at the age of 851 The patient had no brothers. She has 3 sisters. One of her sisters was diagnosed with breast cancer in her mid 642s The patient does not know if his sister was ever genetically tested. The patient's mother had a mastectomy at the age of 367 presumably for breast cancer. There is no other history of breast or in cancer in the family to her knowledge.   GYNECOLOGIC HISTORY:  She does not recall when she had menarche. She had her first child at age 89 She is GX P3. She underwent menopause in her mid 459s She never took hormone replacement.   SOCIAL HISTORY:  She works seasonally for [employer] but is planning to retire as of April 1720. 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 She moved in with her son Linda [name] Sept 2017--he lives in [place] and works as a Airline pilot. His wife is a Marine scientist.there is a 26-monthold child in the house (as of March 2018) Son [facility] is a dAdvertising account executivein addition to having the m[employer] The patient attends a local [employer]here   ADVANCED DIRECTIVES: In place. The patient has opted for a DO NOT RESUSCITATE order. Her daughter BRoberto Scalesis her healthcare power of attorney. Neck affect can be reached at [phone]  HEALTH MAINTENANCE: Social History  Substance Use Topics  . Smoking status: Never Smoker  . Smokeless tobacco: Never Used  . Alcohol use 0.0 oz/week     Comment: rare- occ.     Colonoscopy: Never  PAP: Feb 2013  Bone density: November 2013, normal  Lipid panel:   No Known Allergies  Current Outpatient Prescriptions   Medication Sig Dispense Refill  . anastrozole (ARIMIDEX) 1 MG tablet Take 1 tablet (1 mg total) by mouth daily. 90 tablet 3  . anastrozole (ARIMIDEX) 1 MG tablet Take 1 tablet (1 mg total) by mouth daily. 90 tablet 3  . b complex vitamins tablet Take 1 tablet by mouth daily.    . cholecalciferol (VITAMIN D) 1000 UNITS tablet Take 1 tablet (1,000 Units total) by mouth daily. 100 tablet 12  . dexamethasone (DECADRON) 4 MG tablet Take 2 tablets (8 mg total) by mouth 2 (two) times daily. 120 tablet 0  . furosemide (LASIX) 20 MG tablet Take 1 tablet (20 mg total) by mouth daily as needed. (Patient taking differently: Take 20 mg by mouth daily as needed for fluid. ) 30 tablet 12  . Multiple Vitamins-Iron (MULTIVITAMINS WITH IRON) TABS Take 1 tablet by mouth daily.    . pantoprazole (PROTONIX) 40 MG tablet Take 1 tablet (40 mg total) by mouth daily as needed. 30 tablet 0  . spironolactone (ALDACTONE) 25 MG tablet Take 0.5 tablets (12.5 mg total) by mouth daily. 15 tablet 3  . warfarin (COUMADIN) 5 MG tablet Take 1 tablet (5 mg total) by mouth daily. 30 tablet 0   No current facility-administered medications for this visit.     OBJECTIVE:    Vitals:   12/12/16 1059  BP: (!) 124/57  Pulse: 87  Resp: 18  Temp: 98 F (36.7 C)     There is no height or weight on file to calculate BMI.    ECOG FS: 1 Filed Weights  GENERAL: Older woman sitting in wheelchair HEENT:  Sclerae anicteric.  Oropharynx clear and moist. No ulcerations or evidence of oropharyngeal candidiasis. Neck is supple.  NODES:  No cervical, supraclavicular, or axillary lymphadenopathy palpated.  LUNGS:  Clear to auscultation bilaterally.  No wheezes or rhonchi. HEART:  Regular rate and rhythm. No murmur appreciated. ABDOMEN:  Soft, nontender.  Positive, normoactive bowel sounds. No organomegaly palpated. MSK:  No focal spinal tenderness to palpation.  EXTREMITIES:  No peripheral edema.   SKIN:  Ecchymosis noted on posterior  scalp, bilateral hips, left lower leg, right lower leg, and arm. NEURO: CForde Dandyis inappropriate given current situation     LAB RESULTS: CBC Latest Ref Rng & Units 12/08/2016 11/20/2016 10/30/2016  WBC 3.9 - 10.3 10e3/uL 5.2 4.6 5.2  Hemoglobin 11.6 - 15.9 g/dL 10.2(L) 9.7(L) 9.1(L)  Hematocrit 34.8 - 46.6 % 31.3(L) 28.6(L) 27.3(L)  Platelets 145 - 400 10e3/uL 182 207 208   CMP Latest Ref Rng & Units 12/08/2016 11/20/2016 10/30/2016  Glucose 65 - 99 mg/dL 136(H) 126 135  BUN 6 - 20 mg/dL 20 20.1 16.6  Creatinine 0.44 - 1.00 mg/dL 0.64 0.7 0.8  Sodium 135 - 145 mmol/L 143 143 143  Potassium 3.5 - 5.1 mmol/L 3.3(L) 3.5 3.8  Chloride 101 - 111 mmol/L 107 - -  CO2 22 - 32 mmol/L 29 30(H) 30(H)  Calcium 8.9 - 10.3 mg/dL 9.5 9.9 9.4  Total Protein 6.5 - 8.1 g/dL 7.8 8.1 7.5  Total Bilirubin 0.3 - 1.2 mg/dL 0.8 0.46 0.83  Alkaline Phos 38 - 126 U/L 107 142 112  AST 15 - 41 U/L 93(H) 69(H) 41(H)  ALT 14 - 54 U/L 59(H) 52 29    STUDIES: Most recent brain MRI, 09/22/2016 showed no evidence of recurrent metastases to the brain   ASSESSMENT: 71 y.o.  [place] woman   (1)  status post bilateral breast biopsies 07/24/2011, showing,     (a) on the right, a clinical T2 N0 papillary/ductal breast cancer, estrogen and progesterone receptor positive, HER-2 negative, with an MIB-1 of 10%;    (b) on the left, a clinical T3 N1, stage IIIA invasive ductal carcinoma, grade 3, triple positive, with an MIB-1 of 60%.  (2)  Status post 4 dose dense cycles of doxorubicin/ cyclophosphamide followed by 4 dose dense cycles of paclitaxel and trastuzumab completed 12/09/2011  (3) the trastuzumab was continued for a total of one year (to 11/01/2012). Final echo on 11/04/2012 showing a well preserved ejection fraction of 55-60%.  (4) s/p bilateral mastectomies 01/21/2012 showing  (a) on the Right, an 8 mm invasive papillary carcinoma, grade 1, ypT1b ypN0  (b) on the Left, multiple microscopic foci of  residual  Invasive ductal carcinoma with evidence of dermal lymphatic involvement, pyT1a/T4 pyN0  (5)  Postmastectomy radiation, completed 04/15/2012  (6) Started anastrazole 04/16/2012; normal dexa scan 05/19/2014 at the [facility] 09/11/2014: brain, bones, left effusion (7) CT angiogram 09/11/2014 shows new left pericardial effusion and new mediastinal and hilar lymphadenopathy; there were no suspicious upper abdominal findings; Cytology from the left effusion 09/11/2014 ([id number]) shows malignant cells which are HER-2 positive  (8) Whole body bone scan on 09/25/14 showed metastatic foci throughout axial and appendicular skeleton. Areas of most potential concern include disease in the thoracic spine, disease in the right acetabulum, right superior pubic ramus and possibly proximal right femur, disease in the right humeral shaft and in both femoral shafts.  (8) Started trastuzumab/pertuzumab 09/26/2014, discontinued after 11/29/2014 dose, w progression  (a) T-DM1 started 01/02/2015, repeated every 21 days  (b) echocardiogram 09/02/2016 hows an ejection fraction of 60-65 %  (c echocardiogram 09/20/2016 shows an ejection fraction in the 65-70%)   (9) Brain MRI on 09/25/14 was positive for numerous small enhancing brain mets and calvarium/bone mets   (a) whole brain radiation on 10/09/14--10/27/2014  (b) brain MRI 01/07/2015 shows a complete clinical response  (c) REPEAT BRAIN mri 03/18/2015 SHOWS NO NEW LESIONS  (d) repeat brain MRI with and without contrast 06/29/2015 continues to show no recurrent lesions in the brain  (e) brain MRI 10/12/2015 shows no evidence of recurrent brain metastases. She does have a large left mastoid effusion with trace right mastoid fluid.  (f) brain MRI 02/12/2016 stable--no evidence of active disease  (g) brain MRI 05/19/2016 stable, no evidence of active disease  (10) started zolendronate 11/29/2014, repeated every 12 weeks  11)  left  pleural effusion re-tapped 12/08/2014 (a) left chest tube placement 12/14/2014, removedd 12/21/2014  (  12) large pericardial effusion per echo 12/13/2014  (a) pericardial window placement 12/14/2014; fluid is hemorrhagic; path negative  (13) Right-sided pulmonary emboli noted on CT scan 12/13/2014-- never anticoagulated  (a) no pulmonary emboli demonstrated on non-dedicated chest CT 04/13/2015  (b) lateral lower extremity Doppler 09/18/2016 negative  (c) left upper extremity and right subclavian vein Doppler studies 09/18/2016 negative  (d) CT angiogram of the chest 09/17/2016 shows acute pulmonary embolus bilateral lower lobes with right heart strain   (i) on rivaroxaban until 11/12/2016   (ii) started Coumadin 11/17/2016  (14) brain MRI 12/10/2016 shows extensive recurrence. Life-prolonging treatment was stopped and the hospice referral was placed  PLAN: We spent approximately 30 minutes today going over that situation and the social workers also met with the patient. The plan is for discontinuation of all life prolonging treatment and this is being done. Hospice referral is being placed and hopefully the patient can be enrolled today.  In my opinion we are seeing a less than 2 week prognosis and this will qualify her for [facility] if a bed becomes available. I think that would be the best situation as it would allow her family to spend time with the patient and would ensure that Linda [name] is comfortable and safe in a supportive environment.  Linda [name] should not be resuscitated in case of a terminal event.     Chauncey Cruel 12/13/2016    Chauncey Cruel, MD Medical Oncology and Hematology [facility] [address] [place], [place] [address] Tel. [phone]    Fax. [phone]

## 2017-01-01 ENCOUNTER — Ambulatory Visit: Payer: Self-pay

## 2017-01-01 ENCOUNTER — Other Ambulatory Visit: Payer: Self-pay

## 2017-01-02 ENCOUNTER — Telehealth: Payer: Self-pay

## 2017-01-02 NOTE — Telephone Encounter (Signed)
Returned call from msg received from pt's sone earlier in week.   LVM to return call by 4:30 or we will reach out to him next week.

## 2017-01-05 ENCOUNTER — Telehealth: Payer: Self-pay

## 2017-01-05 NOTE — Telephone Encounter (Signed)
Heather at [facility] called to inform Dr Jana Hakim that pt is discharging from [facility] to home to be followed by hospice at home.

## 2017-01-08 ENCOUNTER — Other Ambulatory Visit: Payer: Self-pay

## 2017-01-08 ENCOUNTER — Ambulatory Visit: Payer: Self-pay

## 2017-02-05 ENCOUNTER — Ambulatory Visit: Payer: Self-pay | Admitting: Oncology

## 2017-02-05 ENCOUNTER — Other Ambulatory Visit: Payer: Self-pay

## 2017-02-05 ENCOUNTER — Ambulatory Visit: Payer: Self-pay

## 2017-04-03 NOTE — Telephone Encounter (Signed)
No entry 

## 2017-04-25 ENCOUNTER — Other Ambulatory Visit: Payer: Self-pay | Admitting: Nurse Practitioner

## 2017-05-20 ENCOUNTER — Other Ambulatory Visit: Payer: Self-pay | Admitting: Nurse Practitioner

## 2018-10-23 ENCOUNTER — Encounter
